# Patient Record
Sex: Male | Born: 1960
Health system: Southern US, Community
[De-identification: ages and names within clinical notes are randomized; demographics above are authoritative.]

## PROBLEM LIST (undated history)

## (undated) DIAGNOSIS — I739 Peripheral vascular disease, unspecified: Secondary | ICD-10-CM

## (undated) DIAGNOSIS — I714 Abdominal aortic aneurysm, without rupture, unspecified: Secondary | ICD-10-CM

## (undated) DIAGNOSIS — I10 Essential (primary) hypertension: Secondary | ICD-10-CM

## (undated) DIAGNOSIS — E11621 Type 2 diabetes mellitus with foot ulcer: Secondary | ICD-10-CM

## (undated) DIAGNOSIS — Z87442 Personal history of urinary calculi: Secondary | ICD-10-CM

## (undated) DIAGNOSIS — E785 Hyperlipidemia, unspecified: Secondary | ICD-10-CM

## (undated) DIAGNOSIS — M1990 Unspecified osteoarthritis, unspecified site: Secondary | ICD-10-CM

## (undated) DIAGNOSIS — E119 Type 2 diabetes mellitus without complications: Secondary | ICD-10-CM

## (undated) DIAGNOSIS — L97509 Non-pressure chronic ulcer of other part of unspecified foot with unspecified severity: Secondary | ICD-10-CM

## (undated) HISTORY — DX: Non-pressure chronic ulcer of other part of unspecified foot with unspecified severity: L97.509

## (undated) HISTORY — PX: INSERTION OF ILIAC STENT: SHX6256

## (undated) HISTORY — PX: CATARACT EXTRACTION: SUR2

## (undated) HISTORY — DX: Abdominal aortic aneurysm, without rupture, unspecified: I71.40

## (undated) HISTORY — DX: Peripheral vascular disease, unspecified: I73.9

## (undated) HISTORY — PX: AMPUTATION TOE: SHX6595

## (undated) HISTORY — DX: Hyperlipidemia, unspecified: E78.5

## (undated) HISTORY — PX: EYE SURGERY: SHX253

## (undated) HISTORY — DX: Type 2 diabetes mellitus with foot ulcer: E11.621

---

## 2013-09-13 HISTORY — PX: TOE AMPUTATION: SHX809

## 2014-01-25 ENCOUNTER — Ambulatory Visit: Payer: Self-pay | Admitting: Podiatry

## 2014-01-29 LAB — PATHOLOGY REPORT

## 2014-02-13 ENCOUNTER — Ambulatory Visit: Payer: Self-pay | Admitting: Vascular Surgery

## 2014-02-13 LAB — CREATININE, SERUM
Creatinine: 0.75 mg/dL
EGFR (African American): >60
EGFR (Non-African Amer.): >60

## 2014-02-13 LAB — BUN: BUN: 14 mg/dL (ref 7–18)

## 2015-01-04 NOTE — Op Note (Signed)
PATIENT NAME:  Anthony Park, Anthony Park MR#:  [id number] DATE OF BIRTH:  Apr 12, 1961  DATE OF PROCEDURE:  01/25/2014  PREOPERATIVE DIAGNOSIS: Left great toe and second toe gangrene.   POSTOPERATIVE DIAGNOSIS: Left great toe and second toe gangrene.   PROCEDURE: Amputation left great toe and second toe metatarsophalangeal joints.   SURGEON: Paralee Pendergrass Park. Ether GriffinsFowler, DPM.   ANESTHESIA: MAC with local.   HEMOSTASIS: None.   COMPLICATIONS: None.   SPECIMEN: Amputated great toe and second toe.   OPERATIVE INDICATIONS: This is Park 54 year old gentleman, who has developed open ulceration and gangrene of his great toe and second toe regions. He has undergone conservative and discussion for surgical intervention and presents today for surgery. All risks, benefits, alternatives and complications associated with surgery were discussed with the patient and informed consent has been given.   OPERATIVE PROCEDURE: The patient was brought into the OR and placed on the operating table in the supine position. IV sedation was administered by the anesthesia team. The left great toe and second toe were anesthetized with lidocaine and Marcaine. The left lower extremity was then prepped and draped in the usual sterile fashion. After sterile prep and drape, then attention was directed to the great toe and second toe. [place] incisions were made proximal to the IPJs of the joint. Full thickness incision was made down to bone. The second toe was excised through the proximal phalanx. This was removed from the surgical field in toto. The great toe was incised and osteotomized through the proximal phalanx as well. These were both sent for pathological examination. The wounds were then flushed with sterile saline. All bleeders were Bovie cauterized. Closure was performed with 3-0 nylon for the skin. Park well compressive sterile dressing was placed after 0.5% Marcaine was placed around the incision sites. Park bulky sterile dressing was then  applied. He will be given home-going instructions for minimal weight-bearing in Park postop shoe. I will see him back next week for Park dressing change. Park prescription for Percocet was given for the patient.   ____________________________ Argentina DonovanJustin Park. Ether GriffinsFowler, DPM jaf:aw D: 01/25/2014 10:19:15 ET T: 01/25/2014 10:32:39 ET JOB#: [id number]  cc: [name]. Ether GriffinsFowler, DPM, <Dictator> Fatemah Pourciau DPM ELECTRONICALLY SIGNED 02/07/2014 10:08

## 2015-01-04 NOTE — Op Note (Signed)
PATIENT NAME:  Anthony Park, [name] MR#:  [id number] DATE OF BIRTH:  Jan 04, 1961  DATE OF PROCEDURE:  02/13/2014  PREOPERATIVE DIAGNOSES: 1.  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  2.  Status post toe amputations for gangrenous changes from embolization.  3.  Diabetes.  4.  Tobacco dependence.   POSTOPERATIVE DIAGNOSES: 1.  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  2.  Status post toe amputations for gangrenous changes from embolization.  3.  Diabetes.  4.  Tobacco dependence.   PROCEDURES: 1.  Ultrasound guidance for vascular access to right femoral artery.  2.  Catheter placement to left peroneal artery from right femoral approach.  3.  Aortogram and selective left lower extremity angiogram.  4.  An 8 mm diameter x 25 cm in length Viabahn covered stent placement to the left popliteal artery for exclusion of popliteal artery aneurysm.  5.  StarClose closure device, right femoral artery.   SURGEON: Annice NeedyJason S. Neenah Canter, MD.   ANESTHESIA: Local with moderate conscious sedation.   BLOOD LOSS: 50 mL.   INDICATION FOR PROCEDURE: This is a 54 year old gentleman who I saw for gangrenous changes to toes on the left foot. He ultimately lost parts of toes 1 and 2 for the gangrenous changes. He was found to have normal perfusion but aneurysmal disease, predominantly in the left popliteal artery.  His left common femoral artery had a smaller aneurysm. He was brought in for treatment of this popliteal artery aneurysm. Risks and benefits were discussed. Informed consent was obtained.   DESCRIPTION OF PROCEDURE: The patient is brought to the vascular suite. Groins were shaved and prepped, and a sterile surgical field was created. Ultrasound was used to visualize a patent right femoral artery and was accessed under direct ultrasound guidance without difficulty with a Seldinger needle. A J-wire and 5-French sheath were then placed. Pigtail catheter was placed in the aorta at the L1  level and AP aortogram was performed. This showed what appeared to be a small infrarenal abdominal aortic aneurysm, ectasia to small aneurysms of the iliac vessels with marked tortuosity and a steep aortic bifurcation. I then used the pigtail catheter and an Advantage wire to cross the aortic bifurcation and advance down to the left femoral head. Selective left lower extremity angiogram was then performed. This demonstrated a small left femoral artery aneurysm. The left popliteal artery aneurysm had a mildly enlarged flow lumen. There was sluggish flow through this and only the posterior tibial artery was seen as distal runoff initially. The patient was systemically heparinized with 5000 units of intravenous heparin. The [facility] Advantage wire was parked well into the left popliteal artery and a 7-French shuttle sheath was then placed in the mid left superficial femoral artery. Imaging was performed to treat the aneurysm and exclude the entire popliteal artery of aneurysmal disease. An 8 mm diameter x 25 cm in length Viabahn covered stent was then deployed just above the origin of the left anterior tibial artery and just up to Hunter's canal. This was post dilated with an 8 mm balloon with excellent angiographic completion result and, distally, we now saw a peroneal artery patent and the posterior tibial artery remained widely patent. The sheath was then removed. StarClose closure device was then deployed in the usual fashion with excellent hemostatic result. The patient tolerated the procedure well and was taken to the recovery room in stable condition.    ____________________________ Annice NeedyJason S. Rhilynn Preyer, MD jsd:ce D: 02/13/2014 14:10:45 ET T: 02/13/2014 15:06:07 ET JOB#:  [id number]  cc: Annice Needy, MD, <Dictator> Argentina Donovan Ether Griffins, DPM Onnie Boer Carlynn Purl, MD Annice Needy MD ELECTRONICALLY SIGNED 02/22/2014 12:32

## 2017-01-20 ENCOUNTER — Ambulatory Visit: Payer: Self-pay | Admitting: Family Medicine

## 2017-01-21 ENCOUNTER — Ambulatory Visit: Payer: Self-pay | Admitting: Family Medicine

## 2018-11-05 ENCOUNTER — Inpatient Hospital Stay (HOSPITAL_COMMUNITY): Payer: BLUE CROSS/BLUE SHIELD

## 2018-11-05 ENCOUNTER — Emergency Department (HOSPITAL_COMMUNITY): Payer: BLUE CROSS/BLUE SHIELD

## 2018-11-05 ENCOUNTER — Inpatient Hospital Stay (HOSPITAL_COMMUNITY)
Admission: EM | Admit: 2018-11-05 | Discharge: 2018-11-07 | DRG: 617 | Disposition: A | Discharge status: Home / Self Care | Payer: BLUE CROSS/BLUE SHIELD | Attending: Internal Medicine | Admitting: Internal Medicine

## 2018-11-05 ENCOUNTER — Encounter (HOSPITAL_COMMUNITY): Payer: Self-pay | Admitting: Emergency Medicine

## 2018-11-05 DIAGNOSIS — L03031 Cellulitis of right toe: Secondary | ICD-10-CM

## 2018-11-05 DIAGNOSIS — E1169 Type 2 diabetes mellitus with other specified complication: Secondary | ICD-10-CM | POA: Diagnosis not present

## 2018-11-05 DIAGNOSIS — E11621 Type 2 diabetes mellitus with foot ulcer: Secondary | ICD-10-CM | POA: Diagnosis present

## 2018-11-05 DIAGNOSIS — E1152 Type 2 diabetes mellitus with diabetic peripheral angiopathy with gangrene: Secondary | ICD-10-CM | POA: Diagnosis not present

## 2018-11-05 DIAGNOSIS — Z825 Family history of asthma and other chronic lower respiratory diseases: Secondary | ICD-10-CM | POA: Diagnosis not present

## 2018-11-05 DIAGNOSIS — F17210 Nicotine dependence, cigarettes, uncomplicated: Secondary | ICD-10-CM | POA: Diagnosis present

## 2018-11-05 DIAGNOSIS — Z6830 Body mass index (BMI) 30.0-30.9, adult: Secondary | ICD-10-CM | POA: Diagnosis not present

## 2018-11-05 DIAGNOSIS — E669 Obesity, unspecified: Secondary | ICD-10-CM | POA: Diagnosis not present

## 2018-11-05 DIAGNOSIS — L0390 Cellulitis, unspecified: Secondary | ICD-10-CM | POA: Diagnosis not present

## 2018-11-05 DIAGNOSIS — I10 Essential (primary) hypertension: Secondary | ICD-10-CM | POA: Diagnosis not present

## 2018-11-05 DIAGNOSIS — Z808 Family history of malignant neoplasm of other organs or systems: Secondary | ICD-10-CM

## 2018-11-05 DIAGNOSIS — L97509 Non-pressure chronic ulcer of other part of unspecified foot with unspecified severity: Secondary | ICD-10-CM

## 2018-11-05 DIAGNOSIS — I96 Gangrene, not elsewhere classified: Secondary | ICD-10-CM

## 2018-11-05 DIAGNOSIS — E13621 Other specified diabetes mellitus with foot ulcer: Secondary | ICD-10-CM | POA: Diagnosis not present

## 2018-11-05 DIAGNOSIS — E1140 Type 2 diabetes mellitus with diabetic neuropathy, unspecified: Secondary | ICD-10-CM | POA: Diagnosis not present

## 2018-11-05 DIAGNOSIS — E785 Hyperlipidemia, unspecified: Secondary | ICD-10-CM | POA: Diagnosis present

## 2018-11-05 DIAGNOSIS — E7800 Pure hypercholesterolemia, unspecified: Secondary | ICD-10-CM | POA: Diagnosis not present

## 2018-11-05 DIAGNOSIS — Z89421 Acquired absence of other right toe(s): Secondary | ICD-10-CM | POA: Diagnosis not present

## 2018-11-05 DIAGNOSIS — M869 Osteomyelitis, unspecified: Secondary | ICD-10-CM

## 2018-11-05 DIAGNOSIS — Z79899 Other long term (current) drug therapy: Secondary | ICD-10-CM | POA: Diagnosis not present

## 2018-11-05 DIAGNOSIS — L97519 Non-pressure chronic ulcer of other part of right foot with unspecified severity: Secondary | ICD-10-CM | POA: Diagnosis not present

## 2018-11-05 HISTORY — DX: Type 2 diabetes mellitus without complications: E11.9

## 2018-11-05 HISTORY — DX: Essential (primary) hypertension: I10

## 2018-11-05 HISTORY — DX: Non-pressure chronic ulcer of other part of unspecified foot with unspecified severity: L97.509

## 2018-11-05 HISTORY — DX: Type 2 diabetes mellitus with foot ulcer: E11.621

## 2018-11-05 LAB — GLUCOSE, CAPILLARY
Glucose-Capillary: 205 mg/dL — ABNORMAL HIGH (ref 70–99)
Glucose-Capillary: 132 mg/dL — ABNORMAL HIGH (ref 70–99)

## 2018-11-05 LAB — BASIC METABOLIC PANEL
Anion gap: 13 (ref 5–15)
BUN: 10 mg/dL (ref 6–20)
CO2: 24 mmol/L (ref 22–32)
Calcium: 9.5 mg/dL (ref 8.9–10.3)
Chloride: 100 mmol/L (ref 98–111)
Creatinine, Ser: 0.73 mg/dL (ref 0.61–1.24)
GFR calc Af Amer: >60 mL/min (ref >60)
Glucose, Bld: 131 mg/dL — ABNORMAL HIGH (ref 70–99)
Potassium: 3.7 mmol/L (ref 3.5–5.1)
Sodium: 137 mmol/L (ref 135–145)

## 2018-11-05 LAB — CBC
HCT: 46.6 % (ref 39.0–52.0)
Hemoglobin: 15.3 g/dL (ref 13.0–17.0)
MCH: 29.1 pg (ref 26.0–34.0)
MCHC: 32.8 g/dL (ref 30.0–36.0)
MCV: 88.6 fL (ref 80.0–100.0)
Platelets: 170 10*3/uL (ref 150–400)
RBC: 5.26 MIL/uL (ref 4.22–5.81)
RDW: 12.7 % (ref 11.5–15.5)
WBC: 6.8 10*3/uL (ref 4.0–10.5)
nRBC: 0 % (ref 0.0–0.2)

## 2018-11-05 LAB — HEMOGLOBIN A1C
Hgb A1c MFr Bld: 6.7 % — ABNORMAL HIGH (ref 4.8–5.6)
Mean Plasma Glucose: 145.59 mg/dL

## 2018-11-05 MED ORDER — SODIUM CHLORIDE 0.9 % IV SOLN: 1.0000 g | INTRAVENOUS | Status: DC

## 2018-11-05 MED ORDER — SODIUM CHLORIDE 0.9 % IV SOLN
2.0000 g | INTRAVENOUS | Status: DC
  Administered 2018-11-06: 2 g via INTRAVENOUS
  Filled 2018-11-05 (×2): qty 20

## 2018-11-05 MED ORDER — VANCOMYCIN HCL 10 G IV SOLR: 1250.0000 mg | Freq: Two times a day (BID) | INTRAVENOUS | Status: DC

## 2018-11-05 MED ORDER — VANCOMYCIN HCL 10 G IV SOLR
1250.0000 mg | Freq: Two times a day (BID) | INTRAVENOUS | Status: DC
  Administered 2018-11-06 – 2018-11-07 (×3): 1250 mg via INTRAVENOUS
  Filled 2018-11-05 (×3): qty 1250

## 2018-11-05 MED ORDER — ACETAMINOPHEN 325 MG PO TABS: 650.0000 mg | ORAL_TABLET | Freq: Four times a day (QID) | ORAL | Status: DC | PRN

## 2018-11-05 MED ORDER — VANCOMYCIN HCL 10 G IV SOLR
2000.0000 mg | Freq: Once | INTRAVENOUS | Status: DC
  Filled 2018-11-05: qty 2000

## 2018-11-05 MED ORDER — VANCOMYCIN HCL IN DEXTROSE 750-5 MG/150ML-% IV SOLN
750.0000 mg | Freq: Once | INTRAVENOUS | Status: AC
  Administered 2018-11-05: 750 mg via INTRAVENOUS
  Filled 2018-11-05: qty 150

## 2018-11-05 MED ORDER — ACETAMINOPHEN 650 MG RE SUPP: 650.0000 mg | Freq: Four times a day (QID) | RECTAL | Status: DC | PRN

## 2018-11-05 MED ORDER — LABETALOL HCL 100 MG PO TABS: 100.0000 mg | ORAL_TABLET | Freq: Every day | ORAL | Status: DC | PRN

## 2018-11-05 MED ORDER — INSULIN ASPART 100 UNIT/ML ~~LOC~~ SOLN: 0.0000 [IU] | Freq: Every day | SUBCUTANEOUS | Status: DC

## 2018-11-05 MED ORDER — SODIUM CHLORIDE 0.9 % IV SOLN
1.0000 g | Freq: Once | INTRAVENOUS | Status: AC
  Administered 2018-11-05: 1 g via INTRAVENOUS
  Filled 2018-11-05: qty 10

## 2018-11-05 MED ORDER — METRONIDAZOLE IN NACL 5-0.79 MG/ML-% IV SOLN
500.0000 mg | Freq: Three times a day (TID) | INTRAVENOUS | Status: DC
  Administered 2018-11-05 – 2018-11-07 (×6): 500 mg via INTRAVENOUS
  Filled 2018-11-05 (×6): qty 100

## 2018-11-05 MED ORDER — SODIUM CHLORIDE 0.9 % IV SOLN
INTRAVENOUS | Status: DC
  Administered 2018-11-05 – 2018-11-07 (×3): via INTRAVENOUS

## 2018-11-05 MED ORDER — VANCOMYCIN HCL 10 G IV SOLR
1250.0000 mg | Freq: Once | INTRAVENOUS | Status: DC
  Administered 2018-11-05: 1250 mg via INTRAVENOUS
  Filled 2018-11-05: qty 1250

## 2018-11-05 MED ORDER — INSULIN ASPART 100 UNIT/ML ~~LOC~~ SOLN
0.0000 [IU] | Freq: Three times a day (TID) | SUBCUTANEOUS | Status: DC
  Administered 2018-11-05 – 2018-11-06 (×2): 3 [IU] via SUBCUTANEOUS
  Administered 2018-11-06: 1 [IU] via SUBCUTANEOUS

## 2018-11-05 MED ORDER — ENOXAPARIN SODIUM 40 MG/0.4ML ~~LOC~~ SOLN
40.0000 mg | SUBCUTANEOUS | Status: DC
  Administered 2018-11-05 – 2018-11-06 (×2): 40 mg via SUBCUTANEOUS
  Filled 2018-11-05 (×2): qty 0.4

## 2018-11-05 NOTE — H&P (Addendum)
Date: 11/05/2018               Patient Name:  Anthony Park MRN: [id number]  DOB: 1960-12-26 Age / Sex: 58 y.o., male   PCP: Alba Cory, MD         Medical Service: Internal Medicine Teaching Service         Attending Physician: Dr. Earl Lagos, MD    First Contact: Dr. Maryla Morrow Pager: [phone]  Second Contact: Dr. Frances Furbish Pager: [phone]       After Hours (After 5p/  First Contact Pager: [phone]  weekends / holidays): Second Contact Pager: [phone]   Chief Complaint: right third toe ulcer  History of Present Illness: 58 year old male with past medical history of diabetes (controlled without medication), who presented to emergency department worsening redness and ulcer at his right third toe.  He initially noticed pink discoloration of the third right toe, 2 weeks ago.  He had pin prick sensation on the toes sometimes but no actuall pain on it. Was not associated with major pain. He denies any fever or chills. No nausea or vomiting. He denies any trauma to his foot.  Meds:  Current Meds  Medication Sig  . Multiple Vitamins-Minerals (MULTIVITAMIN WITH MINERALS) tablet Take 1 tablet by mouth daily.  . TURMERIC PO Take 1 capsule by mouth every other day.     Allergies: Allergies as of 11/05/2018  . (No Known Allergies)   Past Medical History:  Diagnosis Date  . Diabetes mellitus without complication (HCC)   . Hypertension     Family History:  COPD              Mother Brain cancer    Father No family Hx of DM  Social History: current smoker, 40 pack year (1 pack cigarette/day x 40 years). Driks alcohole 2 per day (3 on weekend)  Review of Systems: A complete ROS was negative except as per HPI.  Physical Exam: Blood pressure (!) 198/112, pulse 65, temperature 98.8 F (37.1 C), temperature source Oral, resp. rate 20, height 6' (1.829 m), weight 103 kg, SpO2 98 %.   Physical Exam  Constitutional: He is oriented to person, place, and time and  well-developed, well-nourished, and in no distress.  HENT:  Head: Normocephalic and atraumatic.  Eyes: EOM are normal.  Cardiovascular: Normal rate, regular rhythm and normal heart sounds.  No murmur heard. Pulmonary/Chest: Effort normal and breath sounds normal. No respiratory distress. He has no wheezes. He has no rales.  Abdominal: He exhibits no distension. There is no abdominal tenderness.  Musculoskeletal: Normal range of motion. 1+ bilateral pitting LE Edema present.  Right foot: Right third toe with erythema, swelling and ulcer at tip of the toe with some necrotic tissue. No active bleeding or pus.  foot ROM is normal. Pulse is normal. Left foot: first and second  toe distal amputation. Has weaker pulse comparing to the right foot. Neurological: He is alert and oriented to person, place, and time.  Skin: Skin is warm.  Psychiatric: Mood, memory, affect and judgment normal.         Assessment & Plan by Problem: Active Problems:   Diabetic toe ulcer (HCC)  Diabetic toe ulcer, with cellulitis +/- osteomyelitis: No fever, No leukocytosis. Xray could not rul out osteo.  Will treat with AB and  -IV ceftriaxone, 1 g once and every 24 hours (first dose 11/05/2018) -IV vancomycin  q 12 h -IV metronidazole 500 mg every 8 hours -CBC  next a.m. -CMP next a.m. -MR without contrast right foot---> Soft tissue ulceration at the tip of the third toe with underlying osteomyelitis of the third distal phalanx. No abscess. 2. Nonspecific, nonaggressive appearing 9 mm lesion in the base of the second metatarsal, potentially a small cyst related to old trauma or stress injury. -Vascular ultrasound (ABI), bilateral -HIV antibody -PTT -Tylenol 650 mg every 6 hours PRN for mild pain or fever -N.p.o. after midnight -Will consult ortho tomorrow.   DM: Control without medication. BG at  131 and 205 -Sensitive SSI -CBG monitoring  HTN: Not on medication at home. BP at 170s-200s on arrival.  He is asymptomatic. We will monitor. Place order for PRN PO labetalol for BP>180s for tonight. -Monitor VS  Dispo: Admit patient to Inpatient with expected length of stay greater than 2 midnights.  Diet: Carb modified, n.p.o. after midnight IV fluid: VTE ppx: Lovenox 40 mg subcu daily Code status: Full code  Signed: Chevis Pretty, MD 11/05/2018, 4:18 PM  Pager: [phone]

## 2018-11-05 NOTE — ED Provider Notes (Signed)
[facility] EMERGENCY DEPARTMENT Provider Note   CSN: [id number] Arrival date & time: 11/05/18  0940    History   Chief Complaint Chief Complaint  Patient presents with  . Toe Injury    HPI Anthony Park is a 58 y.o. male.     HPI 58 year old male with a history of diabetes who presents to the emergency department with worsening redness and discoloration of his right third toe.  He initially noticed this 2 weeks ago.  He had been trimming a callus with nail clippers.  He then removed a large piece of callus resulting in splitting of the skin.  He continues to walk and work long hours on his feet.  He noticed increasing redness over the past several days and came to the ER for further evaluation after initial evaluation in urgent care who recommended ER treatment.  He has a history of partial amputation of a toe on his left foot.  He denies fevers and chills.  He is concerned about infection in his toe.  He has neuropathy.  He has no sensation in his feet.   Past Medical History:  Diagnosis Date  . Diabetes mellitus without complication (HCC)   . Hypertension     There are no active problems to display for this patient.   Past Surgical History:  Procedure Laterality Date  . TOE AMPUTATION Left 2015        Home Medications    Prior to Admission medications   Medication Sig Start Date End Date Taking? Authorizing Provider  Multiple Vitamins-Minerals (MULTIVITAMIN WITH MINERALS) tablet Take 1 tablet by mouth daily.   Yes [provider]  TURMERIC PO Take 1 capsule by mouth every other day.   Yes [provider]    Family History No family history on file.  Social History Social History   Tobacco Use  . Smoking status: Not on file  Substance Use Topics  . Alcohol use: Not on file  . Drug use: Not on file     Allergies   Patient has no known allergies.   Review of Systems Review of Systems  All other systems reviewed  and are negative.    Physical Exam Updated Vital Signs BP (!) 196/136   Pulse 65   Temp 98.8 F (37.1 C) (Oral)   Resp 16   Ht 6' (1.829 m)   Wt 103 kg   SpO2 99%   BMI 30.79 kg/m   Physical Exam Vitals signs and nursing note reviewed.  Constitutional:      Appearance: He is well-developed.  HENT:     Head: Normocephalic and atraumatic.  Neck:     Musculoskeletal: Normal range of motion.  Cardiovascular:     Rate and Rhythm: Normal rate and regular rhythm.     Heart sounds: Normal heart sounds.  Pulmonary:     Effort: Pulmonary effort is normal. No respiratory distress.     Breath sounds: Normal breath sounds.  Abdominal:     General: There is no distension.     Palpations: Abdomen is soft.     Tenderness: There is no abdominal tenderness.  Musculoskeletal: Normal range of motion.  Skin:    General: Skin is warm and dry.  Neurological:     Mental Status: He is alert and oriented to person, place, and time.  Psychiatric:        Judgment: Judgment normal.      ED Treatments / Results  Labs (all labs  ordered are listed, but only abnormal results are displayed) Labs Reviewed  BASIC METABOLIC PANEL - Abnormal; Notable for the following components:      Result Value   Glucose, Bld 131 (*)    All other components within normal limits  CBC    EKG None  Radiology Dg Toe 3rd Right  Result Date: 11/05/2018 CLINICAL DATA:  Ulcer to the middle right toe. EXAM: RIGHT THIRD TOE COMPARISON:  None. FINDINGS: Soft tissue ulcer and swelling of the tuft of the third right digit. There is a small area of possible cortical erosion of the distal right third phalanx, with which osteomyelitis can not be excluded. Vascular calcifications noted. IMPRESSION: Soft tissue ulcer and swelling of the tuft of the third right digit. There is a small area of possible cortical erosion of the distal right third phalanx, with which osteomyelitis can not be excluded. Electronically Signed    By: Ted Mcalpine M.D.   On: 11/05/2018 11:37    Procedures Procedures (including critical care time)  Medications Ordered in ED Medications  vancomycin (VANCOCIN) 1,250 mg in sodium chloride 0.9 % 250 mL IVPB (has no administration in time range)  cefTRIAXone (ROCEPHIN) 1 g in sodium chloride 0.9 % 100 mL IVPB (1 g Intravenous New Bag/Given 11/05/18 1047)     Initial Impression / Assessment and Plan / ED Course  I have reviewed the triage vital signs and the nursing notes.  Pertinent labs & imaging results that were available during my care of the patient were reviewed by me and considered in my medical decision making (see chart for details).        Cellulitis of his right third toe.  Questionable erosion on x-ray could represent early developing osteomyelitis.  Antibiotics now.  Elevation.  Admission to the hospital for ongoing IV antibiotic treatment.  Hopefully this can resolve with IV antibiotics and he can be switched to oral antibiotics.  He will need outpatient podiatry follow-up   Final Clinical Impressions(s) / ED Diagnoses   Final diagnoses:  Cellulitis of third toe of right foot    ED Discharge Orders    None       Azalia Bilis, MD 11/05/18 1203

## 2018-11-05 NOTE — Progress Notes (Signed)
Pharmacy Antibiotic Note:  Received call from RN. She received patient to unit with vancomycin 1250mg  infusion half completed, but not charted on. Will discontinue order for 2g loading dose and order 750mg  to be given after 1250mg  IV bag complete. This will complete 2g loading dose.   Maintenance dosing to start 12 hours later.  Thank you for involving pharmacy in this patient's care.  Wendelyn Breslow, PharmD PGY1 Pharmacy Resident Phone: [phone] 11/05/2018 3:18 PM

## 2018-11-05 NOTE — Progress Notes (Signed)
Pharmacy Antibiotic Note  GILLIS HOLTHUSEN is a 58 y.o. male admitted on 11/05/2018 with toe injury.  Imaging shows soft tissue ulcer and swelling and osteomyelitis cannot be ruled out.  Pharmacy has been consulted for vancomycin dosing.  Patient is also started on Rocephin and Flagyl.  SCr 0.73, CrCL > 100 ml/min, afebrile, WBC WNL.  Plan: Vanc 2gm IV x 1, then 1250mg  IV Q12H for AUC 499 using SCr 0.8 Rocephin 1gm IV x 1 now for a total of 2gm today, then 2gm IV Q24H Flagyl 500mg  IV Q8H per MD Monitor renal fxn, clinical course, vanc level as indicated  Height: 6' (182.9 cm) Weight: 227 lb (103 kg) IBW/kg (Calculated) : 77.6  Temp (24hrs), Avg:98.8 F (37.1 C), Min:98.8 F (37.1 C), Max:98.8 F (37.1 C)  Recent Labs  Lab 11/05/18 0950  WBC 6.8  CREATININE 0.73    Estimated Creatinine Clearance: 126.5 mL/min (by C-G formula based on SCr of 0.73 mg/dL).    No Known Allergies   Denver Bentson D. Laney Potash, PharmD, BCPS, BCCCP 11/05/2018, 1:21 PM

## 2018-11-05 NOTE — ED Triage Notes (Signed)
Patient here with complaint of ulcer on middle right toe he first noticed two weeks ago. Approximately 2cm wound at distal toe. Patient has reduced sensation in toe, history of diabetes, controlled with diet. Reports occasional minor pain but not severe enough to prevent him from walking. Patient alert, oriented, and in no apparent distress.

## 2018-11-05 NOTE — Progress Notes (Addendum)
B/p of 198/112. MD made aware. Patient asymptomatic, no complaints headache or dizziness.

## 2018-11-05 NOTE — Plan of Care (Signed)

## 2018-11-05 NOTE — Progress Notes (Signed)
Anthony Park is a 58 y.o. male patient admitted. Awake, alert - oriented  X 4 - no acute distress noted.  VSS - Blood pressure (!) 196/136, pulse 65, temperature 98.8 F (37.1 C), temperature source Oral, resp. rate 16, height 6' (1.829 m), weight 103 kg, SpO2 99 %.    IV in place, occlusive dsg intact without redness. Report received from [facility]  Orientation to room, and floor completed.  Admission INP armband ID verified with patient/family, and in place.   SR up x 2, fall assessment complete, with patient and family able to verbalize understanding of risk associated with falls, and verbalized understanding to call nsg before up out of bed.  Call light within reach, patient able to voice, and demonstrate understanding. No evidence of skin break down noted on exam.       Will cont to eval and treat per MD orders.  Krystal Clark, RN 11/05/2018 1:47 PM

## 2018-11-05 NOTE — ED Notes (Signed)
Vanc order d/c and modified. Will send new vanc up to floor.

## 2018-11-06 ENCOUNTER — Inpatient Hospital Stay (HOSPITAL_COMMUNITY): Payer: BLUE CROSS/BLUE SHIELD

## 2018-11-06 DIAGNOSIS — E11621 Type 2 diabetes mellitus with foot ulcer: Secondary | ICD-10-CM

## 2018-11-06 DIAGNOSIS — L03031 Cellulitis of right toe: Secondary | ICD-10-CM

## 2018-11-06 DIAGNOSIS — L97514 Non-pressure chronic ulcer of other part of right foot with necrosis of bone: Secondary | ICD-10-CM

## 2018-11-06 DIAGNOSIS — L0390 Cellulitis, unspecified: Secondary | ICD-10-CM

## 2018-11-06 LAB — PROTIME-INR
INR: 1.09
Prothrombin Time: 14 seconds (ref 11.4–15.2)

## 2018-11-06 LAB — LIPID PANEL
Cholesterol: 245 mg/dL — ABNORMAL HIGH (ref 0–200)
HDL: 47 mg/dL (ref >40)
LDL Cholesterol: 155 mg/dL — ABNORMAL HIGH (ref 0–99)
Total CHOL/HDL Ratio: 5.2 RATIO
Triglycerides: 216 mg/dL — ABNORMAL HIGH (ref <150)
VLDL: 43 mg/dL — ABNORMAL HIGH (ref 0–40)

## 2018-11-06 LAB — COMPREHENSIVE METABOLIC PANEL
ALT: 19 U/L (ref 0–44)
AST: 25 U/L (ref 15–41)
Albumin: 3.5 g/dL (ref 3.5–5.0)
Alkaline Phosphatase: 53 U/L (ref 38–126)
Anion gap: 10 (ref 5–15)
BUN: 12 mg/dL (ref 6–20)
CO2: 23 mmol/L (ref 22–32)
Calcium: 8.9 mg/dL (ref 8.9–10.3)
Chloride: 103 mmol/L (ref 98–111)
Creatinine, Ser: 0.83 mg/dL (ref 0.61–1.24)
Glucose, Bld: 107 mg/dL — ABNORMAL HIGH (ref 70–99)
Potassium: 3.6 mmol/L (ref 3.5–5.1)
Sodium: 136 mmol/L (ref 135–145)
Total Bilirubin: 1 mg/dL (ref 0.3–1.2)
Total Protein: 6.8 g/dL (ref 6.5–8.1)

## 2018-11-06 LAB — CBC
HCT: 41.2 % (ref 39.0–52.0)
Hemoglobin: 13.7 g/dL (ref 13.0–17.0)
MCH: 29.1 pg (ref 26.0–34.0)
MCHC: 33.3 g/dL (ref 30.0–36.0)
MCV: 87.7 fL (ref 80.0–100.0)
Platelets: 161 10*3/uL (ref 150–400)
RBC: 4.7 MIL/uL (ref 4.22–5.81)
RDW: 12.6 % (ref 11.5–15.5)
WBC: 6.4 10*3/uL (ref 4.0–10.5)
nRBC: 0 % (ref 0.0–0.2)

## 2018-11-06 LAB — GLUCOSE, CAPILLARY
GLUCOSE-CAPILLARY: 201 mg/dL — AB (ref 70–99)
Glucose-Capillary: 116 mg/dL — ABNORMAL HIGH (ref 70–99)
Glucose-Capillary: 130 mg/dL — ABNORMAL HIGH (ref 70–99)
Glucose-Capillary: 145 mg/dL — ABNORMAL HIGH (ref 70–99)
Glucose-Capillary: 97 mg/dL (ref 70–99)

## 2018-11-06 LAB — HIV ANTIBODY (ROUTINE TESTING W REFLEX): HIV Screen 4th Generation wRfx: NONREACTIVE

## 2018-11-06 LAB — APTT: aPTT: 28 seconds (ref 24–36)

## 2018-11-06 MED ORDER — ROSUVASTATIN CALCIUM 20 MG PO TABS
20.0000 mg | ORAL_TABLET | Freq: Every day | ORAL | Status: DC
  Administered 2018-11-06: 20 mg via ORAL
  Filled 2018-11-06: qty 1

## 2018-11-06 MED ORDER — LISINOPRIL 20 MG PO TABS
20.0000 mg | ORAL_TABLET | Freq: Every day | ORAL | Status: DC
  Administered 2018-11-06: 20 mg via ORAL
  Filled 2018-11-06 (×2): qty 1

## 2018-11-06 MED ORDER — SODIUM CHLORIDE 0.9 % IV SOLN
INTRAVENOUS | Status: DC | PRN
  Administered 2018-11-06: 10 mL via INTRAVENOUS

## 2018-11-06 NOTE — Progress Notes (Signed)
Subjective: Pt reports feeling well today, no pain in his foot.  No fevers or chills.  Also denies any dizziness headache or any other complaints.  We talked to him about the plan of talking to orthopedic surgeons for evaluating his toes meanwhile that we continue IV antibiotic.  He verbalized understanding and agrees with the plan.    Objective:  Vital signs in last 24 hours: Vitals:   11/05/18 1349 11/05/18 1719 11/05/18 2053 11/06/18 0503  BP: (!) 198/112 (!) 179/101 (!) 174/101 (!) 172/99  Pulse: 65 64 70 64  Resp: 20 18 18 18   Temp:  98.8 F (37.1 C) 98.6 F (37 C) 98.5 F (36.9 C)  TempSrc:  Oral Oral Oral  SpO2: 98% 96% 95% 97%  Weight:   103 kg   Height:       Physical Exam  General: He is oriented to person, place, and time and well-developed, well-nourished, sitting on the bed and in no distress.  Head: Normocephalic and atraumatic.  Eyes: EOM are normal.  CV: Normal rate, regular rhythm and normal heart sounds.  No murmur heard. Pulm: Effort normal and breath sounds normal. No respiratory distress. He has no wheezes. He has no rales.  Abdominal: Soft, this are present, there is no abdominal tenderness.  Musculoskeletal: Normal range of motion. 1+ bilateral pitting LE Edema present.  Right foot: Right third toe with erythema, swelling and ulcer at tip of the toe with some necrotic tissue. No active bleeding or pus.  (Redness improved comparing to yesterday), foot ROM is normal. Pulse is normal. Left foot: first and second  toe distal phalanx are amputated. Has weaker pulse comparing to the right foot. Neurological: He is alert and oriented to person, place, and time.  Skin: Skin is warm.  Psychiatric:  Normal mood, affect and judgment   Assessment/Plan:  Active Problems:   Diabetic toe ulcer (HCC)  58 year old male with past medical history of DM type II (not on medication) and history of left second distal phalanx mutation, presented with right third toe  erythema, swelling and open ulcer.  Right third toe osteomyelitisosteomyelitis: Likely secondary to PAD and type 2 DM No fever,Noleukocytosis. MRI with showed underlying osteomyelitis of the third distal phalanx without abscess.  Is on IV Ceftriaxon, Vanc and Metro. Doing well today and is pain-free. and with nl white count. No evidence of systemic infection. ABI is pending.  We will continue IV antibiotic for osteomyelitis, and consult orthopedic to evaluate if he needs amputation.  -Continue IV ceftriaxone, 1 g every 24 hours(first dose 11/05/2018) -Continue IV vancomycinq 12 h -ContinueIV metronidazole 500 mg every 8 hours -Consulted ortho. We appreciate their recommendation -Carb modified diet (Per Ortho: if they decide to do the surgery, it will be on Wednesday not today) -Lipid panel--> total cholesterol 245 and LDL 155.  Will restart Crestor. -Start Crestor -Will start aspirin on discharge -F/u vascular ultrasound (ABI),bilateral -Tylenol 650 mg every 6 hours PRN for mild pain or fever -CBC Daily -CMP daily -f/u HIV antibody  DMII.  Not on medication at home. BG at131 and 205 -Sensitive SSI -CBG monitoring  HTN: Not on medication at home. BP at 170s today. He is asymptomatic. Discussed with patient that he may need antihypertension medication if his BP stays elevated.   We will monitor. -Monitor VS and may start him on anti HTN agent next days if stays high  Dispo: Admit patient toInpatient with expected length of stay greater than 2 midnights.  Diet:Carb  modified IV fluid: None VTE PPJ:KDTOIZT 40 mg subcu daily Code status:Full code  Dispo: Anticipated discharge in approximately 2-3 days (Depends on ortho recommendation)  Angelita Ingles, MD 11/06/2018, 8:40 AM Pager: [phone]

## 2018-11-06 NOTE — H&P (View-Only) (Signed)
ORTHOPAEDIC CONSULTATION  REQUESTING PHYSICIAN: Earl Lagos, MD  Chief Complaint: Ulceration pain and swelling right foot third toe  HPI: Anthony Park is a 58 y.o. male who presents with osteomyelitis right foot third toe.  Patient is diabetic he states that it is well controlled with diet he has a history of hypertension as well as high cholesterol and is a smoker.  He is status post amputation partially of the great toe and second toe of the left foot.  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  Past Medical History:  Diagnosis Date  . Diabetes mellitus without complication (HCC)   . Hypertension    Past Surgical History:  Procedure Laterality Date  . TOE AMPUTATION Left 2015   Social History   Socioeconomic History  . Marital status: Married    Spouse name: Not on file  . Number of children: Not on file  . Years of education: Not on file  . Highest education level: Not on file  Occupational History  . Not on file  Social Needs  . Financial resource strain: Not on file  . Food insecurity:    Worry: Not on file    Inability: Not on file  . Transportation needs:    Medical: Not on file    Non-medical: Not on file  Tobacco Use  . Smoking status: Not on file  Substance and Sexual Activity  . Alcohol use: Not on file  . Drug use: Not on file  . Sexual activity: Not on file  Lifestyle  . Physical activity:    Days per week: Not on file    Minutes per session: Not on file  . Stress: Not on file  Relationships  . Social connections:    Talks on phone: Not on file    Gets together: Not on file    Attends religious service: Not on file    Active member of club or organization: Not on file    Attends meetings of clubs or organizations: Not on file    Relationship status: Not on file  Other Topics Concern  . Not on file  Social History Narrative  . Not on file   No family history on file. - negative except  otherwise stated in the family history section No Known Allergies Prior to Admission medications   Medication Sig Start Date End Date Taking? Authorizing Provider  Multiple Vitamins-Minerals (MULTIVITAMIN WITH MINERALS) tablet Take 1 tablet by mouth daily.   Yes [provider]  TURMERIC PO Take 1 capsule by mouth every other day.   Yes [provider]   Mr Foot Right Wo Contrast  Result Date: 11/05/2018 CLINICAL DATA:  Third toe infection. EXAM: MRI OF THE RIGHT FOREFOOT WITHOUT CONTRAST TECHNIQUE: Multiplanar, multisequence MR imaging of the right forefoot was performed. No intravenous contrast was administered. COMPARISON:  Right third toe x-rays from same day. FINDINGS: Bones/Joint/Cartilage Marrow edema throughout the third distal phalanx with cortical erosion and loss of the normal T1 marrow signal at the tip of the phalanx, consistent with osteomyelitis. There is a oval 8 x 6 x 9 mm T2 hyperintense, T1 hypointense lesion in the base of the second metatarsal without osseous destruction or periosteal reaction. No fracture or dislocation. Normal alignment. No joint effusion. Ligaments Collateral ligaments are intact. Muscles and Tendons Flexor and extensor compartment tendons are intact. Increased T2 signal within the intrinsic muscles of the forefoot, nonspecific, but likely  related to diabetic muscle changes. Soft tissue Soft tissue ulceration at the tip of the third toe. No fluid collection or hematoma. No soft tissue mass. IMPRESSION: 1. Soft tissue ulceration at the tip of the third toe with underlying osteomyelitis of the third distal phalanx. No abscess. 2. Nonspecific, nonaggressive appearing 9 mm lesion in the base of the second metatarsal, potentially a small cyst related to old trauma or stress injury. Electronically Signed   By: Obie Dredge M.D.   On: 11/05/2018 13:43   Dg Toe 3rd Right  Result Date: 11/05/2018 CLINICAL DATA:  Ulcer to the middle right toe. EXAM:  RIGHT THIRD TOE COMPARISON:  None. FINDINGS: Soft tissue ulcer and swelling of the tuft of the third right digit. There is a small area of possible cortical erosion of the distal right third phalanx, with which osteomyelitis can not be excluded. Vascular calcifications noted. IMPRESSION: Soft tissue ulcer and swelling of the tuft of the third right digit. There is a small area of possible cortical erosion of the distal right third phalanx, with which osteomyelitis can not be excluded. Electronically Signed   By: Ted Mcalpine M.D.   On: 11/05/2018 11:37   - pertinent xrays, CT, MRI studies were reviewed and independently interpreted  Positive ROS: All other systems have been reviewed and were otherwise negative with the exception of those mentioned in the HPI and as above.  Physical Exam: General: Alert, no acute distress Psychiatric: Patient is competent for consent with normal mood and affect Lymphatic: No axillary or cervical lymphadenopathy Cardiovascular: No pedal edema Respiratory: No cyanosis, no use of accessory musculature GI: No organomegaly, abdomen is soft and non-tender    Images:  @ENCIMAGES @  Labs:  Lab Results  Component Value Date   HGBA1C 6.7 (H) 11/05/2018    Lab Results  Component Value Date   ALBUMIN 3.5 11/06/2018    Neurologic: Patient does not have protective sensation bilateral lower extremities.   MUSCULOSKELETAL:   Skin: Examination patient has swelling ulceration to the right foot third toe.  The amputations partially of the great toe and second toe left foot of well-healed.  Patient has a good dorsalis pedis and posterior tibial pulse on the right foot no ascending cellulitis.  Review of the MRI scan shows osteomyelitis of the distal phalanx of the right third toe.  There is some edema and the second metatarsal shaft but this appears to be more of a stress reaction than osteomyelitis.  There is no abscess.  Assessment: Assessment:  Diabetic insensate neuropathy with hypertension high cholesterol smoking and osteomyelitis of the third toe right foot.  Plan: Plan: We will plan for amputation of the right foot third toe either on Tuesday or Wednesday.  Continue IV antibiotics until surgery and anticipate patient could be discharged without antibiotics after surgery.  Patient will need medication for his hypertension and cholesterol.  Thank you for the consult and the opportunity to see Mr. Donnamarie Poag, MD [facility] [phone] 12:29 PM

## 2018-11-06 NOTE — Anesthesia Preprocedure Evaluation (Addendum)
Anesthesia Evaluation  Patient identified by MRN, date of birth, ID band Patient awake    Reviewed: Allergy & Precautions, NPO status , Patient's Chart, lab work & pertinent test results  Airway Mallampati: II  TM Distance: >3 FB Neck ROM: Full    Dental no notable dental hx. (+) Teeth Intact, Dental Advisory Given   Pulmonary Current Smoker,    Pulmonary exam normal breath sounds clear to auscultation       Cardiovascular hypertension, Normal cardiovascular exam Rhythm:Regular Rate:Normal     Neuro/Psych negative neurological ROS  negative psych ROS   GI/Hepatic negative GI ROS, Neg liver ROS,   Endo/Other  diabetes  Renal/GU negative Renal ROS     Musculoskeletal negative musculoskeletal ROS (+)   Abdominal (+) + obese,   Peds  Hematology negative hematology ROS (+)   Anesthesia Other Findings   Reproductive/Obstetrics                            Anesthesia Physical Anesthesia Plan  ASA: III  Anesthesia Plan: Regional   Post-op Pain Management:  Regional for Post-op pain   Induction:   PONV Risk Score and Plan: Treatment may vary due to age or medical condition, Ondansetron and Dexamethasone  Airway Management Planned: Natural Airway and Nasal Cannula  Additional Equipment:   Intra-op Plan:   Post-operative Plan:   Informed Consent:     Dental advisory given  Plan Discussed with:   Anesthesia Plan Comments: (R third toe Amputation w Pop block)        Anesthesia Quick Evaluation

## 2018-11-06 NOTE — Progress Notes (Signed)
ABI/TBI completed. Please see preliminary notes on CV PROC under chart review. Anthony Park H [place](RDMS RVT) 11/06/18 3:30 PM

## 2018-11-06 NOTE — Consult Note (Signed)
Reason for Consult:Toe osteo Referring Physician: [name] is an 58 y.o. male.  HPI: Anthony Park came to the hospital due to a wound on his right 3rd toe. It began to turn red about 2 weeks ago and then opened about 4d ago. He denies any pain, fever, chills, sweats, N/V. He had a previous experience with the contralateral foot about 5 years ago that resulted in amputation of the great and 2nd toes. He is a diet-controlled diabetic; CBG's run about 120-130.  Past Medical History:  Diagnosis Date  . Diabetes mellitus without complication (HCC)   . Hypertension     Past Surgical History:  Procedure Laterality Date  . TOE AMPUTATION Left 2015    No family history on file.  Social History:  has no history on file for tobacco, alcohol, and drug.  Allergies: No Known Allergies  Medications: I have reviewed the patient's current medications.  Results for orders placed or performed during the hospital encounter of 11/05/18 (from the past 48 hour(s))  CBC     Status: None   Collection Time: 11/05/18  9:50 AM  Result Value Ref Range   WBC 6.8 4.0 - 10.5 K/uL   RBC 5.26 4.22 - 5.81 MIL/uL   Hemoglobin 15.3 13.0 - 17.0 g/dL   HCT 16.1 09.6 - 04.5 %   MCV 88.6 80.0 - 100.0 fL   MCH 29.1 26.0 - 34.0 pg   MCHC 32.8 30.0 - 36.0 g/dL   RDW 40.9 81.1 - 91.4 %   Platelets 170 150 - 400 K/uL   nRBC 0.0 0.0 - 0.2 %    Comment: Performed at [facility], [address], [place], [place] [address]  Basic metabolic panel     Status: Abnormal   Collection Time: 11/05/18  9:50 AM  Result Value Ref Range   Sodium 137 135 - 145 mmol/L   Potassium 3.7 3.5 - 5.1 mmol/L   Chloride 100 98 - 111 mmol/L   CO2 24 22 - 32 mmol/L   Glucose, Bld 131 (H) 70 - 99 mg/dL   BUN 10 6 - 20 mg/dL   Creatinine, Ser 6.21 0.61 - 1.24 mg/dL   Calcium 9.5 8.9 - 30.8 mg/dL   GFR calc non Af Amer >60 >60 mL/min   GFR calc Af Amer >60 >60 mL/min   Anion gap 13 5 - 15    Comment: Performed at [facility], [address], [place], [place] [address]  Hemoglobin A1c     Status: Abnormal   Collection Time: 11/05/18  9:50 AM  Result Value Ref Range   Hgb A1c MFr Bld 6.7 (H) 4.8 - 5.6 %    Comment: (NOTE) Pre diabetes:          5.7%-6.4% Diabetes:              >6.4% Glycemic control for   <7.0% adults with diabetes    Mean Plasma Glucose 145.59 mg/dL    Comment: Performed at [facility], [address], [place], [place] [address]  Glucose, capillary     Status: Abnormal   Collection Time: 11/05/18  4:18 PM  Result Value Ref Range   Glucose-Capillary 205 (H) 70 - 99 mg/dL  Glucose, capillary     Status: Abnormal   Collection Time: 11/05/18  8:52 PM  Result Value Ref Range   Glucose-Capillary 132 (H) 70 - 99 mg/dL  Comprehensive metabolic panel  Status: Abnormal   Collection Time: 11/06/18  3:50 AM  Result Value Ref Range   Sodium 136 135 - 145 mmol/L   Potassium 3.6 3.5 - 5.1 mmol/L   Chloride 103 98 - 111 mmol/L   CO2 23 22 - 32 mmol/L   Glucose, Bld 107 (H) 70 - 99 mg/dL   BUN 12 6 - 20 mg/dL   Creatinine, Ser 3.43 0.61 - 1.24 mg/dL   Calcium 8.9 8.9 - 56.8 mg/dL   Total Protein 6.8 6.5 - 8.1 g/dL   Albumin 3.5 3.5 - 5.0 g/dL   AST 25 15 - 41 U/L   ALT 19 0 - 44 U/L   Alkaline Phosphatase 53 38 - 126 U/L   Total Bilirubin 1.0 0.3 - 1.2 mg/dL   GFR calc non Af Amer >60 >60 mL/min   GFR calc Af Amer >60 >60 mL/min   Anion gap 10 5 - 15    Comment: Performed at [facility], [address] [place], [place] [address]  CBC     Status: None   Collection Time: 11/06/18  3:50 AM  Result Value Ref Range   WBC 6.4 4.0 - 10.5 K/uL   RBC 4.70 4.22 - 5.81 MIL/uL   Hemoglobin 13.7 13.0 - 17.0 g/dL   HCT 72.9 02.1 - 11.5 %   MCV 87.7 80.0 - 100.0 fL   MCH 29.1 26.0 - 34.0 pg   MCHC 33.3 30.0 - 36.0 g/dL   RDW 52.0 80.2 - 23.3 %   Platelets 161 150 - 400 K/uL   nRBC 0.0 0.0 - 0.2 %    Comment: Performed at [facility], [address]  [place], [place] [address]  Protime-INR     Status: None   Collection Time: 11/06/18  3:50 AM  Result Value Ref Range   Prothrombin Time 14.0 11.4 - 15.2 seconds   INR 1.09     Comment: Performed at [facility], [address], [place], [place] [address]  APTT     Status: None   Collection Time: 11/06/18  3:50 AM  Result Value Ref Range   aPTT 28 24 - 36 seconds    Comment: Performed at [facility], [address] [place], [place] [address]  Lipid panel     Status: Abnormal   Collection Time: 11/06/18  3:50 AM  Result Value Ref Range   Cholesterol 245 (H) 0 - 200 mg/dL   Triglycerides 021 (H) <150 mg/dL   HDL 47 >11 mg/dL   Total CHOL/HDL Ratio 5.2 RATIO   VLDL 43 (H) 0 - 40 mg/dL   LDL Cholesterol 735 (H) 0 - 99 mg/dL    Comment:        Total Cholesterol/HDL:CHD Risk Coronary Heart Disease Risk Table                     Men   Women  1/2 Average Risk   3.4   3.3  Average Risk       5.0   4.4  2 X Average Risk   9.6   7.1  3 X Average Risk  23.4   11.0        Use the calculated Patient Ratio above and the CHD Risk Table to determine the patient's CHD Risk.        ATP III CLASSIFICATION (LDL):  <100     mg/dL   Optimal  670-141  mg/dL  Near or Above                    Optimal  130-159  mg/dL   Borderline  423-953  mg/dL   High  >202     mg/dL   Very High Performed at [facility], [address] [place], [place] [address]   Glucose, capillary     Status: Abnormal   Collection Time: 11/06/18  4:45 AM  Result Value Ref Range   Glucose-Capillary 116 (H) 70 - 99 mg/dL  Glucose, capillary     Status: Abnormal   Collection Time: 11/06/18  7:48 AM  Result Value Ref Range   Glucose-Capillary 130 (H) 70 - 99 mg/dL    Mr Foot Right Wo Contrast  Result Date: 11/05/2018 CLINICAL DATA:  Third toe infection. EXAM: MRI OF THE RIGHT FOREFOOT WITHOUT CONTRAST TECHNIQUE: Multiplanar, multisequence MR imaging of the right forefoot was performed. No intravenous  contrast was administered. COMPARISON:  Right third toe x-rays from same day. FINDINGS: Bones/Joint/Cartilage Marrow edema throughout the third distal phalanx with cortical erosion and loss of the normal T1 marrow signal at the tip of the phalanx, consistent with osteomyelitis. There is a oval 8 x 6 x 9 mm T2 hyperintense, T1 hypointense lesion in the base of the second metatarsal without osseous destruction or periosteal reaction. No fracture or dislocation. Normal alignment. No joint effusion. Ligaments Collateral ligaments are intact. Muscles and Tendons Flexor and extensor compartment tendons are intact. Increased T2 signal within the intrinsic muscles of the forefoot, nonspecific, but likely related to diabetic muscle changes. Soft tissue Soft tissue ulceration at the tip of the third toe. No fluid collection or hematoma. No soft tissue mass. IMPRESSION: 1. Soft tissue ulceration at the tip of the third toe with underlying osteomyelitis of the third distal phalanx. No abscess. 2. Nonspecific, nonaggressive appearing 9 mm lesion in the base of the second metatarsal, potentially a small cyst related to old trauma or stress injury. Electronically Signed   By: Obie Dredge M.D.   On: 11/05/2018 13:43   Dg Toe 3rd Right  Result Date: 11/05/2018 CLINICAL DATA:  Ulcer to the middle right toe. EXAM: RIGHT THIRD TOE COMPARISON:  None. FINDINGS: Soft tissue ulcer and swelling of the tuft of the third right digit. There is a small area of possible cortical erosion of the distal right third phalanx, with which osteomyelitis can not be excluded. Vascular calcifications noted. IMPRESSION: Soft tissue ulcer and swelling of the tuft of the third right digit. There is a small area of possible cortical erosion of the distal right third phalanx, with which osteomyelitis can not be excluded. Electronically Signed   By: Ted Mcalpine M.D.   On: 11/05/2018 11:37    Review of Systems  Constitutional: Negative for  chills, fever and weight loss.  HENT: Negative for ear discharge, ear pain, hearing loss and tinnitus.   Eyes: Negative for blurred vision, double vision, photophobia and pain.  Respiratory: Negative for cough, sputum production and shortness of breath.   Cardiovascular: Negative for chest pain.  Gastrointestinal: Negative for abdominal pain, nausea and vomiting.  Genitourinary: Negative for dysuria, flank pain, frequency and urgency.  Musculoskeletal: Negative for back pain, falls, joint pain, myalgias and neck pain.  Neurological: Negative for dizziness, tingling, sensory change, focal weakness, loss of consciousness and headaches.  Endo/Heme/Allergies: Does not bruise/bleed easily.  Psychiatric/Behavioral: Negative for depression, memory loss and substance abuse. The patient is not nervous/anxious.  Blood pressure (!) 172/103, pulse 61, temperature 98.7 F (37.1 C), temperature source Oral, resp. rate 18, height 6' (1.829 m), weight 103 kg, SpO2 98 %. Physical Exam  Constitutional: He appears well-developed and well-nourished. No distress.  HENT:  Head: Normocephalic and atraumatic.  Eyes: Conjunctivae are normal. Right eye exhibits no discharge. Left eye exhibits no discharge. No scleral icterus.  Neck: Normal range of motion.  Cardiovascular: Normal rate and regular rhythm.  Respiratory: Effort normal. No respiratory distress.  Musculoskeletal:     Comments: RLE No traumatic wounds, ecchymosis, or rash  Ulceration tip of 2nd toe  No knee or ankle effusion  Knee stable to varus/ valgus and anterior/posterior stress  Sens DPN, SPN, TN intact, palmar surface of toes anesthetic  Motor EHL, ext, flex, evers 5/5  DP 2+, PT 2+, No significant edema  Neurological: He is alert.  Skin: Skin is warm and dry. He is not diaphoretic.  Psychiatric: He has a normal mood and affect. His behavior is normal.        Assessment/Plan: Right 3nd toe ulceration with distal phalangeal  osteomyelitis -- Dr. Lajoyce Corners to evaluate, suspect amputation will be recommendation. Likely time for this would be Wednesday. This could be done as outpatient to avoid hospitalization for that long in which case would discharge on suppressive antibiotics and have him follow up with Dr. Lajoyce Corners in the office.    Freeman Caldron, PA-C Orthopedic Surgery [phone] 11/06/2018, 11:11 AM

## 2018-11-06 NOTE — Discharge Summary (Signed)
Name: Anthony Park MRN: [id number] DOB: 06-Sep-1961 58 y.o. PCP: Doren Custard, FNP  Date of Admission: 11/05/2018  9:43 AM Date of Discharge: 11/07/2018 Attending Physician: No att. providers found  Dr. Heide Spark  Discharge Diagnosis: 1. Active Problems:   Diabetic toe ulcer (HCC)   Cellulitis of third toe of right foot   Osteomyelitis of third toe of right foot [facility])    Discharge Medications: Allergies as of 11/07/2018   No Known Allergies     Medication List    STOP taking these medications   TURMERIC PO     TAKE these medications   acetaminophen 325 MG tablet Commonly known as:  TYLENOL Take 2 tablets (650 mg total) by mouth every 6 (six) hours as needed for mild pain (or Fever >/= 101).   aspirin EC 81 MG tablet Take 1 tablet (81 mg total) by mouth daily.   lisinopril 20 MG tablet Commonly known as:  PRINIVIL,ZESTRIL Take 1 tablet (20 mg total) by mouth daily.   multivitamin with minerals tablet Take 1 tablet by mouth daily.   rosuvastatin 20 MG tablet Commonly known as:  CRESTOR Take 1 tablet (20 mg total) by mouth daily at 6 PM.            Discharge Care Instructions  (From admission, onward)         Start     Ordered   11/07/18 0000  Touch down weight bearing    Question Answer Comment  Laterality right   Extremity Lower      11/07/18 0944          Disposition and follow-up:   Mr.Jarious A Clippard was discharged from [facility] in stable condition.  At the hospital follow up visit please address:  1. Patient underwent right third toe amputation due to osteomyelitis. No Ab at discharge is needed. Please reevaluate and make sure he follows up with orthopedic.   found  Started on Crestor 20 mg QD on this admission and will discharge patient with ASA 81 mg QD.  2- Had elevated BP at 170s during this hospitalization. On no treatment at home. Started Lisinopril 20 mg QD. Please evaluate the BP.  2.  Labs / imaging  needed at time of follow-up:BMP  3.  Pending labs/ test needing follow-up: None  Follow-up Appointments: Follow-up Information    Alba Cory, MD.   Specialty:  Family Medicine Contact information: [address] [place] [address] [phone]        Nadara Mustard, MD. Schedule an appointment as soon as possible for a visit in 1 week(s).   Specialty:  Orthopedic Surgery Contact information: [address] [place] [address] [phone]           Hospital Course by problem list: 1. Right foot cellulitis and right third toe osteomyelitis: 58 year old male with past medical history of DM type II (not on medication) and history of left second distal phalanx mutation, presented with worsening of right third toe erythema, swelling and open ulcer on tip of same toe. He did not have any systemic symptoms. No fever, no leukocytosis. Foot MR showed osteomyelitis of the right third distal phalanx, without abscess. He started on IV Ceftriaxon, Vancomycina dn Metronidazole. Cellulitis resolved but he developed some fluctuation on exam and orthopedic surgeon planned to do amputation. Patient did well after surgery and discharged without antibiotic.  2- HLD: Patient noted to have an elevated LDL. As well as abnormal  toe to brachial index at ABI  We started the patient on Crestor and ASA.  3- HTN: Patient noted to be hypertensive throughout his admission.  started on Lisinopril 20 mg QD.  Discharge Vitals:   BP 131/81 (BP Location: Left Arm)   Pulse 60   Temp 98.5 F (36.9 C) (Oral)   Resp 18   Ht 6' (1.829 m)   Wt 103 kg   SpO2 96%   BMI 30.80 kg/m    Pertinent Labs, Studies, and Procedures:   HbA1c:6.7 LDL:157 HDL:47 MR right foot:11/05/2018: Soft tissue ulceration at the tip of the third toe with underlying osteomyelitis of the third distal phalanx. No abscess. Nonspecific, nonaggressive appearing 9 mm lesion in the base of the second  metatarsal, potentially a small cyst related to old trauma or stress injury.  Bilateral ABI: The right toe-brachial index is abnormal.  Discharge Instructions: Discharge Instructions    Call MD for:  severe uncontrolled pain   Complete by:  As directed    Call MD for:  temperature >100.4   Complete by:  As directed    Diet - low sodium heart healthy   Complete by:  As directed    Discharge instructions   Complete by:  As directed    You for allowing [place] taking care of you at [facility].  You came in due to right third toes redness and swelling and found to have bone infection.  You are glad that you are doing well after amputation surgery.  Please make sure to follow-up with your orthopedic surgery.  Your leg ultrasound also showed some peripheral artery disease. As we discussed, you start aspirin, blood pressure medication (lisinopril) as well as cholesterol medication (Crestor), please take them as instructed and make sure to follow-up with a primary care within 2 weeks.  We can call us at [phone] if you have any question or concern.   Increase activity slowly   Complete by:  As directed    Touch down weight bearing   Complete by:  As directed    Laterality:  right   Extremity:  Lower      Signed: Chevis Pretty, MD 11/16/2018, 9:43 PM   Pager: [phone]

## 2018-11-06 NOTE — Consult Note (Signed)
ORTHOPAEDIC CONSULTATION  REQUESTING PHYSICIAN: Earl Lagos, MD  Chief Complaint: Ulceration pain and swelling right foot third toe  HPI: Anthony Park is a 58 y.o. male who presents with osteomyelitis right foot third toe.  Patient is diabetic he states that it is well controlled with diet he has a history of hypertension as well as high cholesterol and is a smoker.  He is status post amputation partially of the great toe and second toe of the left foot.  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  Past Medical History:  Diagnosis Date  . Diabetes mellitus without complication (HCC)   . Hypertension    Past Surgical History:  Procedure Laterality Date  . TOE AMPUTATION Left 2015   Social History   Socioeconomic History  . Marital status: Married    Spouse name: Not on file  . Number of children: Not on file  . Years of education: Not on file  . Highest education level: Not on file  Occupational History  . Not on file  Social Needs  . Financial resource strain: Not on file  . Food insecurity:    Worry: Not on file    Inability: Not on file  . Transportation needs:    Medical: Not on file    Non-medical: Not on file  Tobacco Use  . Smoking status: Not on file  Substance and Sexual Activity  . Alcohol use: Not on file  . Drug use: Not on file  . Sexual activity: Not on file  Lifestyle  . Physical activity:    Days per week: Not on file    Minutes per session: Not on file  . Stress: Not on file  Relationships  . Social connections:    Talks on phone: Not on file    Gets together: Not on file    Attends religious service: Not on file    Active member of club or organization: Not on file    Attends meetings of clubs or organizations: Not on file    Relationship status: Not on file  Other Topics Concern  . Not on file  Social History Narrative  . Not on file   No family history on file. - negative except  otherwise stated in the family history section No Known Allergies Prior to Admission medications   Medication Sig Start Date End Date Taking? Authorizing Provider  Multiple Vitamins-Minerals (MULTIVITAMIN WITH MINERALS) tablet Take 1 tablet by mouth daily.   Yes [provider]  TURMERIC PO Take 1 capsule by mouth every other day.   Yes [provider]   Mr Foot Right Wo Contrast  Result Date: 11/05/2018 CLINICAL DATA:  Third toe infection. EXAM: MRI OF THE RIGHT FOREFOOT WITHOUT CONTRAST TECHNIQUE: Multiplanar, multisequence MR imaging of the right forefoot was performed. No intravenous contrast was administered. COMPARISON:  Right third toe x-rays from same day. FINDINGS: Bones/Joint/Cartilage Marrow edema throughout the third distal phalanx with cortical erosion and loss of the normal T1 marrow signal at the tip of the phalanx, consistent with osteomyelitis. There is a oval 8 x 6 x 9 mm T2 hyperintense, T1 hypointense lesion in the base of the second metatarsal without osseous destruction or periosteal reaction. No fracture or dislocation. Normal alignment. No joint effusion. Ligaments Collateral ligaments are intact. Muscles and Tendons Flexor and extensor compartment tendons are intact. Increased T2 signal within the intrinsic muscles of the forefoot, nonspecific, but likely  related to diabetic muscle changes. Soft tissue Soft tissue ulceration at the tip of the third toe. No fluid collection or hematoma. No soft tissue mass. IMPRESSION: 1. Soft tissue ulceration at the tip of the third toe with underlying osteomyelitis of the third distal phalanx. No abscess. 2. Nonspecific, nonaggressive appearing 9 mm lesion in the base of the second metatarsal, potentially a small cyst related to old trauma or stress injury. Electronically Signed   By: William T Derry M.D.   On: 11/05/2018 13:43   Dg Toe 3rd Right  Result Date: 11/05/2018 CLINICAL DATA:  Ulcer to the middle right toe. EXAM:  RIGHT THIRD TOE COMPARISON:  None. FINDINGS: Soft tissue ulcer and swelling of the tuft of the third right digit. There is a small area of possible cortical erosion of the distal right third phalanx, with which osteomyelitis can not be excluded. Vascular calcifications noted. IMPRESSION: Soft tissue ulcer and swelling of the tuft of the third right digit. There is a small area of possible cortical erosion of the distal right third phalanx, with which osteomyelitis can not be excluded. Electronically Signed   By: Dobrinka  Dimitrova M.D.   On: 11/05/2018 11:37   - pertinent xrays, CT, MRI studies were reviewed and independently interpreted  Positive ROS: All other systems have been reviewed and were otherwise negative with the exception of those mentioned in the HPI and as above.  Physical Exam: General: Alert, no acute distress Psychiatric: Patient is competent for consent with normal mood and affect Lymphatic: No axillary or cervical lymphadenopathy Cardiovascular: No pedal edema Respiratory: No cyanosis, no use of accessory musculature GI: No organomegaly, abdomen is soft and non-tender    Images:  @ENCIMAGES@  Labs:  Lab Results  Component Value Date   HGBA1C 6.7 (H) 11/05/2018    Lab Results  Component Value Date   ALBUMIN 3.5 11/06/2018    Neurologic: Patient does not have protective sensation bilateral lower extremities.   MUSCULOSKELETAL:   Skin: Examination patient has swelling ulceration to the right foot third toe.  The amputations partially of the great toe and second toe left foot of well-healed.  Patient has a good dorsalis pedis and posterior tibial pulse on the right foot no ascending cellulitis.  Review of the MRI scan shows osteomyelitis of the distal phalanx of the right third toe.  There is some edema and the second metatarsal shaft but this appears to be more of a stress reaction than osteomyelitis.  There is no abscess.  Assessment: Assessment:  Diabetic insensate neuropathy with hypertension high cholesterol smoking and osteomyelitis of the third toe right foot.  Plan: Plan: We will plan for amputation of the right foot third toe either on Tuesday or Wednesday.  Continue IV antibiotics until surgery and anticipate patient could be discharged without antibiotics after surgery.  Patient will need medication for his hypertension and cholesterol.  Thank you for the consult and the opportunity to see Mr. Dorval   , MD [facility] [phone] 12:29 PM     

## 2018-11-07 ENCOUNTER — Encounter (HOSPITAL_COMMUNITY)
Admission: EM | Disposition: A | Discharge status: Home / Self Care | Payer: Self-pay | Source: Home / Self Care | Attending: Internal Medicine

## 2018-11-07 ENCOUNTER — Other Ambulatory Visit: Payer: Self-pay | Admitting: Internal Medicine

## 2018-11-07 ENCOUNTER — Inpatient Hospital Stay (HOSPITAL_COMMUNITY): Payer: BLUE CROSS/BLUE SHIELD | Admitting: Anesthesiology

## 2018-11-07 DIAGNOSIS — M869 Osteomyelitis, unspecified: Secondary | ICD-10-CM

## 2018-11-07 DIAGNOSIS — E785 Hyperlipidemia, unspecified: Secondary | ICD-10-CM

## 2018-11-07 DIAGNOSIS — Z89421 Acquired absence of other right toe(s): Secondary | ICD-10-CM

## 2018-11-07 DIAGNOSIS — Z79899 Other long term (current) drug therapy: Secondary | ICD-10-CM

## 2018-11-07 DIAGNOSIS — L03031 Cellulitis of right toe: Secondary | ICD-10-CM

## 2018-11-07 HISTORY — PX: AMPUTATION: SHX166

## 2018-11-07 LAB — GLUCOSE, CAPILLARY
GLUCOSE-CAPILLARY: 114 mg/dL — AB (ref 70–99)
Glucose-Capillary: 119 mg/dL — ABNORMAL HIGH (ref 70–99)
Glucose-Capillary: 117 mg/dL — ABNORMAL HIGH (ref 70–99)

## 2018-11-07 LAB — SURGICAL PCR SCREEN
MRSA, PCR: NEGATIVE
Staphylococcus aureus: NEGATIVE

## 2018-11-07 SURGERY — AMPUTATION DIGIT
Anesthesia: Regional | Laterality: Right

## 2018-11-07 MED ORDER — BISACODYL 10 MG RE SUPP: 10.0000 mg | Freq: Every day | RECTAL | Status: DC | PRN

## 2018-11-07 MED ORDER — HYDROMORPHONE HCL 1 MG/ML IJ SOLN: 0.2500 mg | INTRAMUSCULAR | Status: DC | PRN

## 2018-11-07 MED ORDER — ENOXAPARIN SODIUM 40 MG/0.4ML ~~LOC~~ SOLN: 40.0000 mg | SUBCUTANEOUS | Status: DC

## 2018-11-07 MED ORDER — MAGNESIUM CITRATE PO SOLN: 1.0000 | Freq: Once | ORAL | Status: DC | PRN

## 2018-11-07 MED ORDER — ROSUVASTATIN CALCIUM 20 MG PO TABS: 20.0000 mg | ORAL_TABLET | Freq: Every day | ORAL | 0 refills | Status: DC

## 2018-11-07 MED ORDER — DOCUSATE SODIUM 100 MG PO CAPS
100.0000 mg | ORAL_CAPSULE | Freq: Two times a day (BID) | ORAL | Status: DC
  Filled 2018-11-07: qty 1

## 2018-11-07 MED ORDER — METHOCARBAMOL 1000 MG/10ML IJ SOLN
500.0000 mg | Freq: Four times a day (QID) | INTRAVENOUS | Status: DC | PRN
  Filled 2018-11-07: qty 5

## 2018-11-07 MED ORDER — OXYCODONE HCL 5 MG PO TABS: 5.0000 mg | ORAL_TABLET | Freq: Four times a day (QID) | ORAL | 0 refills | Status: DC | PRN

## 2018-11-07 MED ORDER — OXYCODONE HCL 5 MG PO TABS
10.0000 mg | ORAL_TABLET | ORAL | Status: DC | PRN
  Filled 2018-11-07: qty 3

## 2018-11-07 MED ORDER — MIDAZOLAM HCL 2 MG/2ML IJ SOLN
INTRAMUSCULAR | Status: AC
  Filled 2018-11-07: qty 2

## 2018-11-07 MED ORDER — MIDAZOLAM HCL 2 MG/2ML IJ SOLN
INTRAMUSCULAR | Status: AC
  Administered 2018-11-07: 2 mg via INTRAVENOUS
  Filled 2018-11-07: qty 2

## 2018-11-07 MED ORDER — LACTATED RINGERS IV SOLN
INTRAVENOUS | Status: DC | PRN
  Administered 2018-11-07: 09:00:00 via INTRAVENOUS

## 2018-11-07 MED ORDER — ACETAMINOPHEN 10 MG/ML IV SOLN: 1000.0000 mg | Freq: Once | INTRAVENOUS | Status: DC | PRN

## 2018-11-07 MED ORDER — ACETAMINOPHEN 325 MG PO TABS: 325.0000 mg | ORAL_TABLET | Freq: Four times a day (QID) | ORAL | Status: DC | PRN

## 2018-11-07 MED ORDER — FENTANYL CITRATE (PF) 100 MCG/2ML IJ SOLN
INTRAMUSCULAR | Status: AC
  Administered 2018-11-07: 50 ug via INTRAVENOUS
  Filled 2018-11-07: qty 2

## 2018-11-07 MED ORDER — ASPIRIN EC 81 MG PO TBEC: 81.0000 mg | DELAYED_RELEASE_TABLET | Freq: Every day | ORAL | 0 refills | Status: AC

## 2018-11-07 MED ORDER — SODIUM CHLORIDE 0.9 % IV SOLN: INTRAVENOUS | Status: DC

## 2018-11-07 MED ORDER — MEPERIDINE HCL 50 MG/ML IJ SOLN: 6.2500 mg | INTRAMUSCULAR | Status: DC | PRN

## 2018-11-07 MED ORDER — METOCLOPRAMIDE HCL 5 MG/ML IJ SOLN: 5.0000 mg | Freq: Three times a day (TID) | INTRAMUSCULAR | Status: DC | PRN

## 2018-11-07 MED ORDER — LISINOPRIL 20 MG PO TABS: 20.0000 mg | ORAL_TABLET | Freq: Every day | ORAL | 0 refills | Status: DC

## 2018-11-07 MED ORDER — PROPOFOL 500 MG/50ML IV EMUL
INTRAVENOUS | Status: DC | PRN
  Administered 2018-11-07: 100 ug/kg/min via INTRAVENOUS

## 2018-11-07 MED ORDER — ONDANSETRON HCL 4 MG/2ML IJ SOLN: 4.0000 mg | Freq: Four times a day (QID) | INTRAMUSCULAR | Status: DC | PRN

## 2018-11-07 MED ORDER — PROPOFOL 10 MG/ML IV BOLUS
INTRAVENOUS | Status: AC
  Filled 2018-11-07: qty 20

## 2018-11-07 MED ORDER — FENTANYL CITRATE (PF) 250 MCG/5ML IJ SOLN
INTRAMUSCULAR | Status: AC
  Filled 2018-11-07: qty 5

## 2018-11-07 MED ORDER — ACETAMINOPHEN 325 MG PO TABS: 650.0000 mg | ORAL_TABLET | Freq: Four times a day (QID) | ORAL | 0 refills | Status: DC | PRN

## 2018-11-07 MED ORDER — OXYCODONE HCL 5 MG PO TABS: 5.0000 mg | ORAL_TABLET | ORAL | Status: DC | PRN

## 2018-11-07 MED ORDER — HYDROCODONE-ACETAMINOPHEN 7.5-325 MG PO TABS: 1.0000 | ORAL_TABLET | Freq: Once | ORAL | Status: DC | PRN

## 2018-11-07 MED ORDER — METOCLOPRAMIDE HCL 5 MG PO TABS: 5.0000 mg | ORAL_TABLET | Freq: Three times a day (TID) | ORAL | Status: DC | PRN

## 2018-11-07 MED ORDER — ONDANSETRON HCL 4 MG PO TABS: 4.0000 mg | ORAL_TABLET | Freq: Four times a day (QID) | ORAL | Status: DC | PRN

## 2018-11-07 MED ORDER — ONDANSETRON HCL 4 MG/2ML IJ SOLN
INTRAMUSCULAR | Status: DC | PRN
  Administered 2018-11-07: 4 mg via INTRAVENOUS

## 2018-11-07 MED ORDER — PROPOFOL 10 MG/ML IV BOLUS
INTRAVENOUS | Status: DC | PRN
  Administered 2018-11-07: 40 mg via INTRAVENOUS

## 2018-11-07 MED ORDER — CLONIDINE HCL (ANALGESIA) 100 MCG/ML EP SOLN
EPIDURAL | Status: DC | PRN
  Administered 2018-11-07: 100 ug

## 2018-11-07 MED ORDER — MIDAZOLAM HCL 2 MG/2ML IJ SOLN
2.0000 mg | Freq: Once | INTRAMUSCULAR | Status: AC
  Administered 2018-11-07: 2 mg via INTRAVENOUS
  Filled 2018-11-07: qty 2

## 2018-11-07 MED ORDER — POLYETHYLENE GLYCOL 3350 17 G PO PACK: 17.0000 g | PACK | Freq: Every day | ORAL | Status: DC | PRN

## 2018-11-07 MED ORDER — HYDROMORPHONE HCL 1 MG/ML IJ SOLN: 0.5000 mg | INTRAMUSCULAR | Status: DC | PRN

## 2018-11-07 MED ORDER — FENTANYL CITRATE (PF) 250 MCG/5ML IJ SOLN
INTRAMUSCULAR | Status: DC | PRN
  Administered 2018-11-07: 50 ug via INTRAVENOUS

## 2018-11-07 MED ORDER — FENTANYL CITRATE (PF) 100 MCG/2ML IJ SOLN
50.0000 ug | Freq: Once | INTRAMUSCULAR | Status: AC
  Administered 2018-11-07: 50 ug via INTRAVENOUS
  Filled 2018-11-07: qty 1

## 2018-11-07 MED ORDER — PROMETHAZINE HCL 25 MG/ML IJ SOLN: 6.2500 mg | INTRAMUSCULAR | Status: DC | PRN

## 2018-11-07 MED ORDER — ONDANSETRON HCL 4 MG/2ML IJ SOLN
INTRAMUSCULAR | Status: AC
  Filled 2018-11-07: qty 2

## 2018-11-07 MED ORDER — METHOCARBAMOL 500 MG PO TABS: 500.0000 mg | ORAL_TABLET | Freq: Four times a day (QID) | ORAL | Status: DC | PRN

## 2018-11-07 MED ORDER — ROPIVACAINE HCL 5 MG/ML IJ SOLN
INTRAMUSCULAR | Status: DC | PRN
  Administered 2018-11-07: 30 mL via PERINEURAL

## 2018-11-07 SURGICAL SUPPLY — 34 items
BLADE SURG 21 STRL SS (BLADE) ×2 IMPLANT
BNDG COHESIVE 3X5 TAN STRL LF (GAUZE/BANDAGES/DRESSINGS) ×1 IMPLANT
BNDG COHESIVE 4X5 TAN STRL (GAUZE/BANDAGES/DRESSINGS) ×2 IMPLANT
BNDG ESMARK 4X9 LF (GAUZE/BANDAGES/DRESSINGS) IMPLANT
BNDG GAUZE ELAST 4 BULKY (GAUZE/BANDAGES/DRESSINGS) ×2 IMPLANT
COVER SURGICAL LIGHT HANDLE (MISCELLANEOUS) ×4 IMPLANT
COVER WAND RF STERILE (DRAPES) ×1 IMPLANT
DRAPE U-SHAPE 47X51 STRL (DRAPES) ×2 IMPLANT
DRSG ADAPTIC 3X8 NADH LF (GAUZE/BANDAGES/DRESSINGS) IMPLANT
DRSG PAD ABDOMINAL 8X10 ST (GAUZE/BANDAGES/DRESSINGS) ×2 IMPLANT
DURAPREP 26ML APPLICATOR (WOUND CARE) ×2 IMPLANT
ELECT REM PT RETURN 9FT ADLT (ELECTROSURGICAL) ×2
ELECTRODE REM PT RTRN 9FT ADLT (ELECTROSURGICAL) ×1 IMPLANT
GAUZE SPONGE 4X4 12PLY STRL (GAUZE/BANDAGES/DRESSINGS) IMPLANT
GLOVE BIOGEL PI IND STRL 7.0 (GLOVE) IMPLANT
GLOVE BIOGEL PI IND STRL 9 (GLOVE) ×1 IMPLANT
GLOVE BIOGEL PI INDICATOR 7.0 (GLOVE) ×1
GLOVE BIOGEL PI INDICATOR 9 (GLOVE) ×1
GLOVE SURG ORTHO 9.0 STRL STRW (GLOVE) ×2 IMPLANT
GOWN STRL REUS W/ TWL LRG LVL3 (GOWN DISPOSABLE) IMPLANT
GOWN STRL REUS W/ TWL XL LVL3 (GOWN DISPOSABLE) ×2 IMPLANT
GOWN STRL REUS W/TWL LRG LVL3 (GOWN DISPOSABLE) ×1
GOWN STRL REUS W/TWL XL LVL3 (GOWN DISPOSABLE) ×1
KIT BASIN OR (CUSTOM PROCEDURE TRAY) ×2 IMPLANT
KIT TURNOVER KIT B (KITS) ×2 IMPLANT
MANIFOLD NEPTUNE II (INSTRUMENTS) ×1 IMPLANT
NEEDLE 22X1 1/2 (OR ONLY) (NEEDLE) IMPLANT
NS IRRIG 1000ML POUR BTL (IV SOLUTION) ×2 IMPLANT
PACK ORTHO EXTREMITY (CUSTOM PROCEDURE TRAY) ×2 IMPLANT
PAD ABD 7.5X8 STRL (GAUZE/BANDAGES/DRESSINGS) ×1 IMPLANT
PAD ARMBOARD 7.5X6 YLW CONV (MISCELLANEOUS) ×4 IMPLANT
SUT ETHILON 2 0 PSLX (SUTURE) ×2 IMPLANT
SYR CONTROL 10ML LL (SYRINGE) IMPLANT
TOWEL OR 17X26 10 PK STRL BLUE (TOWEL DISPOSABLE) ×2 IMPLANT

## 2018-11-07 NOTE — Op Note (Signed)
11/07/2018  9:48 AM  PATIENT:  Anthony Park    PRE-OPERATIVE DIAGNOSIS:  Osteomyelitis Right 3rd Toe  POST-OPERATIVE DIAGNOSIS:  Same  PROCEDURE:  RIGHT 3RD TOE AMPUTATION  SURGEON:  Nadara Mustard, MD  PHYSICIAN ASSISTANT:None ANESTHESIA:   General  PREOPERATIVE INDICATIONS:  Anthony Park is a  58 y.o. male with a diagnosis of Osteomyelitis Right 3rd Toe who failed conservative measures and elected for surgical management.    The risks benefits and alternatives were discussed with the patient preoperatively including but not limited to the risks of infection, bleeding, nerve injury, cardiopulmonary complications, the need for revision surgery, among others, and the patient was willing to proceed.  OPERATIVE IMPLANTS: None  @ENCIMAGES @  OPERATIVE FINDINGS: No abscess at the MTP joint good petechial bleeding  OPERATIVE PROCEDURE: Patient was brought to the operating room after regional anesthetic.  After adequate levels anesthesia were obtained patient's right lower extremity was prepped using ChloraPrep draped in the sterile field a timeout was called.  AV incision was made around the third toe this was carried down to the MTP joint.  The toe was amputated through the MTP joint.  There is no signs of infection at this site.  The wound was irrigated with normal saline electrocautery was used for hemostasis wound was closed using 2-0 nylon.  Sterile dressing was applied patient was taken the PACU in stable condition.   DISCHARGE PLANNING:  Antibiotic duration: May discontinue IV antibiotics postoperatively.  Weightbearing: Touchdown weightbearing on the right  Pain medication: Orders written for opioid pathway  Dressing care/ Wound VAC: Dressing to be changed in the office at follow-up in a week.  Ambulatory devices: Walker or crutches.  Discharge to: Patient may be discharged to home once he is safe with ambulation.  Patient may discharge to home today.  Follow-up: In  the office 1 week post operative.

## 2018-11-07 NOTE — Interval H&P Note (Signed)
History and Physical Interval Note:  11/07/2018 9:13 AM  Anthony Park  has presented today for surgery, with the diagnosis of Osteomyelitis Right 3rd Toe  The various methods of treatment have been discussed with the patient and family. After consideration of risks, benefits and other options for treatment, the patient has consented to  Procedure(s): RIGHT 3RD TOE AMPUTATION (Right) as a surgical intervention .  The patient's history has been reviewed, patient examined, no change in status, stable for surgery.  I have reviewed the patient's chart and labs.  Questions were answered to the patient's satisfaction.     Nadara Mustard

## 2018-11-07 NOTE — Anesthesia Procedure Notes (Signed)
Anesthesia Regional Block: Popliteal block   Pre-Anesthetic Checklist: ,, timeout performed, Correct Patient, Correct Site, Correct Laterality, Correct Procedure, Correct Position, site marked, Risks and benefits discussed, pre-op evaluation,  At surgeon's request and post-op pain management  Laterality: Right  Prep: Maximum Sterile Barrier Precautions used, chloraprep       Needles:  Injection technique: Single-shot  Needle Type: Echogenic Needle     Needle Length: 9cm  Needle Gauge: 21     Additional Needles:   Procedures:,,,, ultrasound used (permanent image in chart),,,,  Narrative:  Start time: 11/07/2018 8:46 AM End time: 11/07/2018 8:55 AM Injection made incrementally with aspirations every 5 mL.  Performed by: Personally  Anesthesiologist: Trevor Iha, MD  Additional Notes: Block assessed. Patient tolerated procedure well.

## 2018-11-07 NOTE — Evaluation (Addendum)
Physical Therapy Evaluation and Discharge Patient Details Name: Anthony Park MRN: [id number] DOB: 01/15/1961 Today's Date: 11/07/2018   History of Present Illness  Pt is a 58 y/o male with a PMH significant for DM, HTN, and amputation partially of great toe and second toe of L foot. Pt presents with osteomyelitis Right 3rd Toe, s/p amputation on 11/07/2018.   Clinical Impression  Patient evaluated by Physical Therapy with no further acute PT needs identified. All education has been completed and the patient has no further questions. At the time of PT eval pt was able to perform transfers and ambulation with gross modified independence to independence and RW for support. Pt with overall poor safety awareness and required frequent cues for safety and maintenance of TDWB status on the RLE. Pt was educated verbally on stair training (refused practicing), car transfer, and general precautions. See below for any follow-up Physical Therapy or equipment needs. PT is signing off. Thank you for this referral.        Follow Up Recommendations No PT follow up;Supervision - Intermittent (Pt refusing HHPT and outpatient PT)    Equipment Recommendations  Rolling walker with 5" wheels    Recommendations for Other Services       Precautions / Restrictions Precautions Precautions: Fall Restrictions Weight Bearing Restrictions: Yes RLE Weight Bearing: Touchdown weight bearing      Mobility  Bed Mobility Overal bed mobility: Independent                Transfers Overall transfer level: Modified independent Equipment used: Rolling walker (2 wheeled)             General transfer comment: VC's for safety. Pt did not require assist  Ambulation/Gait Ambulation/Gait assistance: Modified independent (Device/Increase time) Gait Distance (Feet): 50 Feet Assistive device: Rolling walker (2 wheeled) Gait Pattern/deviations: Step-to pattern;Decreased stride length Gait velocity:  Decreased Gait velocity interpretation: 1.31 - 2.62 ft/sec, indicative of limited community ambulator General Gait Details: VC's for TDWB status. Pt appears to be putting weight through the RLE however pt insistent that he is maintaining TDWB status. No unsteadiness or LOB noted.   Stairs         General stair comments: Pt refused stair training, however we verbally reviewed negotiating stairs and maintaining TDWB status.   Wheelchair Mobility    Modified Rankin (Stroke Patients Only)       Balance Overall balance assessment: Needs assistance Sitting-balance support: Feet supported;No upper extremity supported Sitting balance-Leahy Scale: Good     Standing balance support: Bilateral upper extremity supported;During functional activity Standing balance-Leahy Scale: Poor Standing balance comment: Reliant on UE support                             Pertinent Vitals/Pain Pain Assessment: Faces Faces Pain Scale: Hurts a little bit Pain Location: R foot Pain Descriptors / Indicators: Operative site guarding;Numbness Pain Intervention(s): Limited activity within patient's tolerance;Monitored during session;Repositioned    Home Living Family/patient expects to be discharged to:: Private residence Living Arrangements: Spouse/significant other Available Help at Discharge: Family Type of Home: House Home Access: Stairs to enter   Secretary/administrator of Steps: 3 Home Layout: One level Home Equipment: Shower seat - built in;Grab bars - tub/shower      Prior Function Level of Independence: Independent               Hand Dominance        Extremity/Trunk Assessment  Upper Extremity Assessment Upper Extremity Assessment: Overall WFL for tasks assessed    Lower Extremity Assessment Lower Extremity Assessment: RLE deficits/detail RLE Deficits / Details: Decreased strength and AROM consistent with above mentioned diagnosis and procedure.  RLE Sensation:  (still numb from surgery)    Cervical / Trunk Assessment Cervical / Trunk Assessment: Normal  Communication   Communication: No difficulties  Cognition Arousal/Alertness: Awake/alert Behavior During Therapy: Anxious;Impulsive Overall Cognitive Status: Within Functional Limits for tasks assessed                                        General Comments      Exercises     Assessment/Plan    PT Assessment Patent does not need any further PT services  PT Problem List         PT Treatment Interventions      PT Goals (Current goals can be found in the Care Plan section)  Acute Rehab PT Goals Patient Stated Goal: Home today PT Goal Formulation: All assessment and education complete, DC therapy    Frequency     Barriers to discharge        Co-evaluation               AM-PAC PT "6 Clicks" Mobility  Outcome Measure Help needed turning from your back to your side while in a flat bed without using bedrails?: None Help needed moving from lying on your back to sitting on the side of a flat bed without using bedrails?: None Help needed moving to and from a bed to a chair (including a wheelchair)?: None Help needed standing up from a chair using your arms (e.g., wheelchair or bedside chair)?: None Help needed to walk in hospital room?: None Help needed climbing 3-5 steps with a railing? : A Little 6 Click Score: 23    End of Session Equipment Utilized During Treatment: Gait belt Activity Tolerance: Patient tolerated treatment well Patient left: with family/visitor present(Sitting EOB awaiting d/c instructions. Wife present) Nurse Communication: Mobility status PT Visit Diagnosis: Pain;Difficulty in walking, not elsewhere classified (R26.2) Pain - Right/Left: Right Pain - part of body: Ankle and joints of foot    Time: 1353-1410 PT Time Calculation (min) (ACUTE ONLY): 17 min   Charges:   PT Evaluation $PT Eval Low Complexity: 1 Low           Conni Slipper, PT, DPT Acute Rehabilitation Services Pager: [phone] Office: [phone]   Anthony Park 11/07/2018, 2:55 PM

## 2018-11-07 NOTE — Care Management Note (Signed)
Case Management Note Hortencia Conradi, RN MSN CCM Transitions of Care 1[facility] [place] [phone]  Patient Details  Name: MAVEN CHALMERS MRN: [id number] Date of Birth: [birth year]  Subjective/Objective:             Diabetic toe ulcer     Action/Plan: Spoke with patient and spouse at bedside. PTA patient home with spouse-independent-working. Declined need for HHPT or outpatient PT. Accepted referral for DME-RW-agreed to using [facility]. Call made for RW to be delivered. No medication needs. States prescriptions to be electronically sent to pharmacy. Asked about paperwork detailing events of this hospitalization for his short term disability case. Advised to set up My Chart. Encouraged f/u with PCP-reports needing a closer PCP-advised to call number on insurance card to ensure in network status. Discussed that outpatient PCP was the best contact for any disability claims. Spouse at bedside and able to provide transportation home. No other transition of care needs identified.   Expected Discharge Date:  11/07/18               Expected Discharge Plan:  Home/Self Care  In-House Referral:  PCP / [facility]  Discharge planning Services  CM Consult  Post Acute Care Choice:  Durable Medical Equipment Choice offered to:  Patient, Spouse  DME Arranged:  Walker rolling DME Agency:  [facility]  HH Arranged:  NA HH Agency:  NA  Status of Service:  Completed, signed off  If discussed at Long Length of Stay Meetings, dates discussed:    Additional Comments:  Bess Kinds, RN 11/07/2018, 2:31 PM

## 2018-11-07 NOTE — Progress Notes (Signed)
Orthopedic Tech Progress Note Patient Details:  DACK RIVETT 22-Nov-1960 [id number]  Ortho Devices Type of Ortho Device: Postop shoe/boot Ortho Device/Splint Interventions: Ordered, Application, Adjustment   Post Interventions Patient Tolerated: Well Instructions Provided: Care of device, Adjustment of device   Kirstan Fentress J Mekaylah Klich 11/07/2018, 12:18 PM

## 2018-11-07 NOTE — Transfer of Care (Signed)
Immediate Anesthesia Transfer of Care Note  Patient: Anthony Park  Procedure(s) Performed: RIGHT 3RD TOE AMPUTATION (Right )  Patient Location: PACU  Anesthesia Type:General  Level of Consciousness: awake, alert  and oriented  Airway & Oxygen Therapy: Patient Spontanous Breathing and Patient connected to face mask oxygen  Post-op Assessment: Report given to RN and Post -op Vital signs reviewed and stable  Post vital signs: Reviewed and stable  Last Vitals:  Vitals Value Taken Time  BP 133/89 11/07/2018  9:47 AM  Temp 36.5 C 11/07/2018  9:47 AM  Pulse 60 11/07/2018  9:47 AM  Resp 11 11/07/2018  9:47 AM  SpO2 98 % 11/07/2018  9:47 AM  Vitals shown include unvalidated device data.  Last Pain:  Vitals:   11/07/18 0500  TempSrc: Oral  PainSc:          Complications: No apparent anesthesia complications

## 2018-11-07 NOTE — Anesthesia Postprocedure Evaluation (Signed)
Anesthesia Post Note  Patient: Anthony Park  Procedure(s) Performed: RIGHT 3RD TOE AMPUTATION (Right )     Patient location during evaluation: PACU Anesthesia Type: Regional Level of consciousness: awake and alert Pain management: pain level controlled Vital Signs Assessment: post-procedure vital signs reviewed and stable Respiratory status: spontaneous breathing, nonlabored ventilation, respiratory function stable and patient connected to nasal cannula oxygen Cardiovascular status: stable and blood pressure returned to baseline Postop Assessment: no apparent nausea or vomiting Anesthetic complications: no    Last Vitals:  Vitals:   11/07/18 0955 11/07/18 1015  BP: 131/89 131/81  Pulse: (!) 58 60  Resp: 16 18  Temp: 36.5 C 36.9 C  SpO2: 96% 96%    Last Pain:  Vitals:   11/07/18 1015  TempSrc: Oral  PainSc:                  Trevor Iha

## 2018-11-07 NOTE — Progress Notes (Signed)
RN verified the presence of a signed informed consent that matches stated procedure by patient. Verified armband matches patient's stated name and birth date. Verified NPO status and that all jewelry, contact, glasses, dentures, and partials had been removed (if applicable).  

## 2018-11-07 NOTE — Progress Notes (Signed)
   Subjective: saw and evaluated patient after surgery. He is doing well. PT is in room and working with him. No acute complaint. We discussed about discharging him today. Reminded him that we started him with new medications and he needs to have a PCP for follow up. He and his wife agree.  Objective:  Vital signs in last 24 hours: Vitals:   11/06/18 0503 11/06/18 0856 11/06/18 1820 11/06/18 2151  BP: (!) 172/99 (!) 172/103 (!) 173/98 (!) 162/104  Pulse: 64 61 68 62  Resp: 18 18 18 20   Temp: 98.5 F (36.9 C) 98.7 F (37.1 C) 98.9 F (37.2 C) 98.1 F (36.7 C)  TempSrc: Oral Oral Oral Oral  SpO2: 97% 98% 97% 94%  Weight:      Height:       Physical Exam  Constitutional: Appears well-developed and well-nourished. No distress.  HENT:  Head: Normocephalic and atraumatic.  Eyes: Conjunctivae are normal.  Cardiovascular: Normal rate, regular rhythm and normal heart sounds.  Respiratory: Effort normal and breath sounds normal. No respiratory distress. No wheezes.  GI: Soft. Bowel sounds are normal. No distension. There is no tenderness.  Musculoskeletal: right foot is wrapped after surgery. Left foot:first and second toe distal phalanx are amputated Neurological: Is alert.  Skin: Not diaphoretic. No erythema.  Psychiatric: Normal mood and affect. Behavior is normal. Judgment and thought content normal.    Assessment/Plan:  Active Problems:   Diabetic toe ulcer (HCC)   Cellulitis of third toe of right foot  58 year old male with past medical history of DM type II (not on medication) and history of left second distal phalanx mutation, presented with right third toe erythema, swelling and open ulcer.  Right third toe osteomyelitisosteomyelitis: Likely secondary to PAD and type 2 DM S/p amputation today. Doing well and PT is in the room working with him. Orthopedic discharged him. No antibiotic is needed.  ABI showed abnormal left toe/brachial. We start ASA at  discharge.  -D/c to home today -OXY IR q 6h for pain x 5 days -No Antibiotic needed  -Continue Crestor at home   DMII.  BG at131 and 205 on SSI.  -Not on medication at home. recommended to f/u with PCP for management -Consult case manager to assist finding PCP  HTN: improved after starting Lisinopril -discharge with Lisinopril 10 mg QD    Dispo: D/C home today   Chevis Pretty, MD 11/07/2018, 4:56 AM Pager: [phone]

## 2018-11-08 ENCOUNTER — Encounter (HOSPITAL_COMMUNITY): Payer: Self-pay | Admitting: Orthopedic Surgery

## 2018-11-09 ENCOUNTER — Telehealth (INDEPENDENT_AMBULATORY_CARE_PROVIDER_SITE_OTHER): Payer: Self-pay | Admitting: Orthopedic Surgery

## 2018-11-09 ENCOUNTER — Telehealth: Payer: Self-pay

## 2018-11-09 ENCOUNTER — Telehealth (INDEPENDENT_AMBULATORY_CARE_PROVIDER_SITE_OTHER): Payer: Self-pay

## 2018-11-09 NOTE — Telephone Encounter (Signed)
Pt called saying he has had a little bleeding and he is wondering if it is okay to still wait till his apt Thursday or should he come in to get a new dressing.

## 2018-11-09 NOTE — Telephone Encounter (Signed)
Pt has an appt tomorrow at 12:15

## 2018-11-09 NOTE — Telephone Encounter (Signed)
Can you please call pt. We can see him tomorrow afternoon if he would like to come in.

## 2018-11-09 NOTE — Telephone Encounter (Signed)
Triage call (voice mail): had 3rd toe amputated on 12/06/2018. He can see that there has been some bleeding, but there is nothing "oozing out - the bandage is holding."  He just wants to know if this is normal or if he should worry about a suture having broken. [phone].

## 2018-11-09 NOTE — Telephone Encounter (Signed)
Transition Care Management Follow-up Telephone Call  Date of discharge and from where: 11/07/18 [facility]  How have you been since you were released from the hospital? Pt states he is doing okay. He is not taking oxycodone for pain. Pt's current pain level 2 out of 10. Nerve block has worn off mostly but still has some numbness on right calf and can now move foot with full range motion at ankle.   Any questions or concerns? Yes- hospital provider advised referral to podiatry needed from PCP   Items Reviewed:   Did the pt receive and understand the discharge instructions provided? Yes   Medications obtained and verified? Yes   Any new allergies since your discharge? No   Dietary orders reviewed? Yes  Do you have support at home? Yes   Functional Questionnaire: (I = Independent and D = Dependent) ADLs: I  Bathing/Dressing- I  Meal Prep- I  Eating- I  Maintaining continence- I  Transferring/Ambulation- I  Managing Meds- I  Follow up appointments reviewed:   PCP Hospital f/u appt confirmed? Yes  Scheduled to see .  Specialist Hospital f/u appt confirmed? Yes  Scheduled to see Dr. Lajoyce Corners on 11/10/18.   Are transportation arrangements needed? No   If their condition worsens, is the pt aware to call PCP or go to the Emergency Dept.? Yes  Was the patient provided with contact information for the PCP's office or ED? Yes  Was to pt encouraged to call back with questions or concerns? Yes

## 2018-11-09 NOTE — Telephone Encounter (Signed)
Anthony Park can you please open me a slot on Shawn's Schedule for 12:15

## 2018-11-10 ENCOUNTER — Ambulatory Visit (INDEPENDENT_AMBULATORY_CARE_PROVIDER_SITE_OTHER): Payer: BLUE CROSS/BLUE SHIELD | Admitting: Physician Assistant

## 2018-11-10 ENCOUNTER — Encounter (INDEPENDENT_AMBULATORY_CARE_PROVIDER_SITE_OTHER): Payer: Self-pay | Admitting: Physician Assistant

## 2018-11-10 VITALS — Ht 72.0 in | Wt 227.1 lb

## 2018-11-10 DIAGNOSIS — Z89421 Acquired absence of other right toe(s): Secondary | ICD-10-CM

## 2018-11-10 DIAGNOSIS — E1142 Type 2 diabetes mellitus with diabetic polyneuropathy: Secondary | ICD-10-CM

## 2018-11-10 MED ORDER — DOXYCYCLINE HYCLATE 100 MG PO CAPS: 100.0000 mg | ORAL_CAPSULE | Freq: Two times a day (BID) | ORAL | 1 refills | Status: DC

## 2018-11-10 NOTE — Progress Notes (Signed)
Office Visit Note   Patient: Anthony Park           Date of Birth: [birth year]           MRN: [id number] Visit Date: 11/10/2018              Requested by: Doren Custard, FNP [address] [place], [place] [address] PCP: Doren Custard, FNP  Chief Complaint  Patient presents with  . Right Foot - Pain, Routine Post Op      HPI: The patient is a 58 year old gentleman who is seen for postoperative follow-up following a right third toe amputation on 11/07/2018 for osteomyelitis.  He comes in early today due to concerns of bleeding through the dressing.  He has been minimally weightbearing with a walker.  He does think that he may have bumped the foot against something.  He had a postoperative block and he reports that this lasted a couple of days and he is just now getting some sensation completely back.  Assessment & Plan: Visit Diagnoses:  1. Status post amputation of toe of right foot (HCC)   2. Type 2 diabetes mellitus with diabetic polyneuropathy, without long-term current use of insulin (HCC)   3. Diabetic polyneuropathy associated with type 2 diabetes mellitus (HCC)     Plan: Operative dressings were removed and the patient has no active bleeding from the incisional area.  He does have some peri-incisional erythema and some mild widening of the incision distally.  Were going to start some doxycycline 100 mg p.o. twice daily.  He was instructed to maintain strict nonweightbearing and continue to walk with a walker and elevate as much as possible.  Also recommended daily gauze to the incisional area and wrapped with Ace wrapping.  He will follow-up next week as previously scheduled.  Follow-Up Instructions: Return in about 6 days (around 11/16/2018).   Ortho Exam  Patient is alert, oriented, no adenopathy, well-dressed, normal affect, normal respiratory effort. The right foot third toe amputation site has no active bleeding.  There is some mild widening of the distal  incision with some exposure of subcutaneous tissue.  There is some peri-incisional erythema and edema.  He has palpable pedal pulses.  Imaging: No results found.   Labs: Lab Results  Component Value Date   HGBA1C 6.7 (H) 11/05/2018     Lab Results  Component Value Date   ALBUMIN 3.5 11/06/2018    Body mass index is 30.8 kg/m.  Orders:  No orders of the defined types were placed in this encounter.  Meds ordered this encounter  Medications  . doxycycline (VIBRAMYCIN) 100 MG capsule    Sig: Take 1 capsule (100 mg total) by mouth 2 (two) times daily.    Dispense:  14 capsule    Refill:  1     Procedures: No procedures performed  Clinical Data: No additional findings.  ROS:  All other systems negative, except as noted in the HPI. Review of Systems  Objective: Vital Signs: Ht 6' (1.829 m)   Wt 227 lb 1.1 oz (103 kg)   BMI 30.80 kg/m   Specialty Comments:  No specialty comments available.  PMFS History: Patient Active Problem List   Diagnosis Date Noted  . Osteomyelitis of third toe of right foot (HCC)   . Cellulitis of third toe of right foot   . Diabetic toe ulcer (HCC) 11/05/2018   Past Medical History:  Diagnosis Date  . Diabetes mellitus without complication (HCC)   .  Hypertension     History reviewed. No pertinent family history.  Past Surgical History:  Procedure Laterality Date  . AMPUTATION Right 11/07/2018   Procedure: RIGHT 3RD TOE AMPUTATION;  Surgeon: Nadara Mustard, MD;  Location: [facility] OR;  Service: Orthopedics;  Laterality: Right;  . TOE AMPUTATION Left 2015   Social History   Occupational History  . Not on file  Tobacco Use  . Smoking status: Not on file  Substance and Sexual Activity  . Alcohol use: Not on file  . Drug use: Not on file  . Sexual activity: Not on file

## 2018-11-16 ENCOUNTER — Encounter (INDEPENDENT_AMBULATORY_CARE_PROVIDER_SITE_OTHER): Payer: Self-pay | Admitting: Orthopedic Surgery

## 2018-11-16 ENCOUNTER — Ambulatory Visit (INDEPENDENT_AMBULATORY_CARE_PROVIDER_SITE_OTHER): Payer: BLUE CROSS/BLUE SHIELD | Admitting: Orthopedic Surgery

## 2018-11-16 VITALS — Ht 72.0 in | Wt 227.0 lb

## 2018-11-16 DIAGNOSIS — Z89421 Acquired absence of other right toe(s): Secondary | ICD-10-CM

## 2018-11-16 NOTE — Progress Notes (Signed)
   Office Visit Note   Patient: Anthony Park           Date of Birth: [birth year]           MRN: [id number] Visit Date: 11/16/2018              Requested by: Alba Cory, MD [address] [place], [place] [address] PCP: Doren Custard, FNP  Chief Complaint  Patient presents with  . Right Foot - Routine Post Op    11/07/2018 right 3rd toe amputation       HPI: [name] who presents 2 weeks status post right foot third toe amputation he states he has had some bleeding he will finish a course of doxycycline he denies any pain denies any swelling.  Assessment & Plan: Visit Diagnoses:  1. Status post amputation of toe of right foot (HCC)     Plan: Patient will continue with protected weightbearing with his walker and postoperative shoe Dial soap cleansing and wear a Band-Aid follow-up in 1 week to harvest the sutures.  Recommended a stiff soled hiking sneaker to unload pressure from the forefoot.  Follow-Up Instructions: Return in about 1 week (around 11/23/2018).   Ortho Exam  Patient is alert, oriented, no adenopathy, well-dressed, normal affect, normal respiratory effort. Examination the incision edges are well approximated there is a small amount of hyper granulation tissue this was touched with silver nitrate.  There is no cellulitis no tenderness to palpation no drainage no signs of infection.  Imaging: No results found. No images are attached to the encounter.  Labs: Lab Results  Component Value Date   HGBA1C 6.7 (H) 11/05/2018     Lab Results  Component Value Date   ALBUMIN 3.5 11/06/2018    Body mass index is 30.79 kg/m.  Orders:  No orders of the defined types were placed in this encounter.  No orders of the defined types were placed in this encounter.    Procedures: No procedures performed  Clinical Data: No additional findings.  ROS:  All other systems negative, except as noted in the HPI. Review of  Systems  Objective: Vital Signs: Ht 6' (1.829 m)   Wt 227 lb (103 kg)   BMI 30.79 kg/m   Specialty Comments:  No specialty comments available.  PMFS History: Patient Active Problem List   Diagnosis Date Noted  . Osteomyelitis of third toe of right foot (HCC)   . Cellulitis of third toe of right foot   . Diabetic toe ulcer (HCC) 11/05/2018   Past Medical History:  Diagnosis Date  . Diabetes mellitus without complication (HCC)   . Hypertension     History reviewed. No pertinent family history.  Past Surgical History:  Procedure Laterality Date  . AMPUTATION Right 11/07/2018   Procedure: RIGHT 3RD TOE AMPUTATION;  Surgeon: Nadara Mustard, MD;  Location: [facility] OR;  Service: Orthopedics;  Laterality: Right;  . TOE AMPUTATION Left 2015   Social History   Occupational History  . Not on file  Tobacco Use  . Smoking status: Unknown If Ever Smoked  . Smokeless tobacco: Never Used  Substance and Sexual Activity  . Alcohol use: Not on file  . Drug use: Not on file  . Sexual activity: Not on file

## 2018-11-22 ENCOUNTER — Ambulatory Visit (INDEPENDENT_AMBULATORY_CARE_PROVIDER_SITE_OTHER): Payer: BLUE CROSS/BLUE SHIELD | Admitting: Family Medicine

## 2018-11-22 ENCOUNTER — Encounter: Payer: Self-pay | Admitting: Family Medicine

## 2018-11-22 ENCOUNTER — Other Ambulatory Visit: Payer: Self-pay

## 2018-11-22 VITALS — BP 168/110 | HR 90 | Temp 98.2°F | Resp 14 | Ht 72.0 in | Wt 231.0 lb

## 2018-11-22 DIAGNOSIS — L97509 Non-pressure chronic ulcer of other part of unspecified foot with unspecified severity: Secondary | ICD-10-CM

## 2018-11-22 DIAGNOSIS — I724 Aneurysm of artery of lower extremity: Secondary | ICD-10-CM | POA: Insufficient documentation

## 2018-11-22 DIAGNOSIS — S98131A Complete traumatic amputation of one right lesser toe, initial encounter: Secondary | ICD-10-CM

## 2018-11-22 DIAGNOSIS — M869 Osteomyelitis, unspecified: Secondary | ICD-10-CM

## 2018-11-22 DIAGNOSIS — L03031 Cellulitis of right toe: Secondary | ICD-10-CM | POA: Diagnosis not present

## 2018-11-22 DIAGNOSIS — Z716 Tobacco abuse counseling: Secondary | ICD-10-CM

## 2018-11-22 DIAGNOSIS — E11621 Type 2 diabetes mellitus with foot ulcer: Secondary | ICD-10-CM | POA: Diagnosis not present

## 2018-11-22 DIAGNOSIS — I10 Essential (primary) hypertension: Secondary | ICD-10-CM | POA: Diagnosis not present

## 2018-11-22 DIAGNOSIS — E782 Mixed hyperlipidemia: Secondary | ICD-10-CM | POA: Diagnosis not present

## 2018-11-22 DIAGNOSIS — Z1211 Encounter for screening for malignant neoplasm of colon: Secondary | ICD-10-CM

## 2018-11-22 DIAGNOSIS — Z95820 Peripheral vascular angioplasty status with implants and grafts: Secondary | ICD-10-CM

## 2018-11-22 DIAGNOSIS — L97514 Non-pressure chronic ulcer of other part of right foot with necrosis of bone: Secondary | ICD-10-CM

## 2018-11-22 MED ORDER — AMLODIPINE BESYLATE 5 MG PO TABS: 5.0000 mg | ORAL_TABLET | Freq: Every day | ORAL | 3 refills | Status: DC

## 2018-11-22 MED ORDER — LISINOPRIL 20 MG PO TABS: 20.0000 mg | ORAL_TABLET | Freq: Every day | ORAL | 1 refills | Status: DC

## 2018-11-22 MED ORDER — ROSUVASTATIN CALCIUM 20 MG PO TABS: 20.0000 mg | ORAL_TABLET | Freq: Every day | ORAL | 1 refills | Status: DC

## 2018-11-22 NOTE — Assessment & Plan Note (Signed)
Amputation completed by Dr. Lajoyce Corners.  Referral to podiatry placed today. Has suture removal scheduled for tomorrow. Completed doxycycline.

## 2018-11-22 NOTE — Assessment & Plan Note (Signed)
Discussed increased risk of co-morbid conditions, MI/CVA. Discussed importance of 150 minutes of physical activity weekly, eat two servings of fish weekly, eat one serving of tree nuts ( cashews, pistachios, pecans, almonds.Marland Kitchen) every other day, eat 6 servings of fruit/vegetables daily and drink plenty of water and avoid sweet beverages.

## 2018-11-22 NOTE — Patient Instructions (Signed)
Fat and Cholesterol Restricted Eating Plan Getting too much fat and cholesterol in your diet may cause health problems. Choosing the right foods helps keep your fat and cholesterol at normal levels. This can keep you from getting certain diseases. Your doctor may recommend an eating plan that includes:  Total fat: ______% or less of total calories a day.  Saturated fat: ______% or less of total calories a day.  Cholesterol: less than _________mg a day.  Fiber: ______g a day. What are tips for following this plan? Meal planning  At meals, divide your plate into four equal parts: ? Fill one-half of your plate with vegetables and green salads. ? Fill one-fourth of your plate with whole grains. ? Fill one-fourth of your plate with low-fat (lean) protein foods.  Eat fish that is high in omega-3 fats at least two times a week. This includes mackerel, tuna, sardines, and salmon.  Eat foods that are high in fiber, such as whole grains, beans, apples, broccoli, carrots, peas, and barley. General tips   Work with your doctor to lose weight if you need to.  Avoid: ? Foods with added sugar. ? Fried foods. ? Foods with partially hydrogenated oils.  Limit alcohol intake to no more than 1 drink a day for nonpregnant women and 2 drinks a day for men. One drink equals 12 oz of beer, 5 oz of wine, or 1 oz of hard liquor. Reading food labels  Check food labels for: ? Trans fats. ? Partially hydrogenated oils. ? Saturated fat (g) in each serving. ? Cholesterol (mg) in each serving. ? Fiber (g) in each serving.  Choose foods with healthy fats, such as: ? Monounsaturated fats. ? Polyunsaturated fats. ? Omega-3 fats.  Choose grain products that have whole grains. Look for the word "whole" as the first word in the ingredient list. Cooking  Cook foods using low-fat methods. These include baking, boiling, grilling, and broiling.  Eat more home-cooked foods. Eat at restaurants and buffets  less often.  Avoid cooking using saturated fats, such as butter, cream, palm oil, palm kernel oil, and coconut oil. Recommended foods  Fruits  All fresh, canned (in natural juice), or frozen fruits. Vegetables  Fresh or frozen vegetables (raw, steamed, roasted, or grilled). Green salads. Grains  Whole grains, such as whole wheat or whole grain breads, crackers, cereals, and pasta. Unsweetened oatmeal, bulgur, barley, quinoa, or brown rice. Corn or whole wheat flour tortillas. Meats and other protein foods  Ground beef (85% or leaner), grass-fed beef, or beef trimmed of fat. Skinless chicken or turkey. Ground chicken or turkey. Pork trimmed of fat. All fish and seafood. Egg whites. Dried beans, peas, or lentils. Unsalted nuts or seeds. Unsalted canned beans. Nut butters without added sugar or oil. Dairy  Low-fat or nonfat dairy products, such as skim or 1% milk, 2% or reduced-fat cheeses, low-fat and fat-free ricotta or cottage cheese, or plain low-fat and nonfat yogurt. Fats and oils  Tub margarine without trans fats. Light or reduced-fat mayonnaise and salad dressings. Avocado. Olive, canola, sesame, or safflower oils. The items listed above may not be a complete list of foods and beverages you can eat. Contact a dietitian for more information. Foods to avoid Fruits  Canned fruit in heavy syrup. Fruit in cream or butter sauce. Fried fruit. Vegetables  Vegetables cooked in cheese, cream, or butter sauce. Fried vegetables. Grains  White bread. White pasta. White rice. Cornbread. Bagels, pastries, and croissants. Crackers and snack foods that contain trans fat   and hydrogenated oils. Meats and other protein foods  Fatty cuts of meat. Ribs, chicken wings, bacon, sausage, bologna, salami, chitterlings, fatback, hot dogs, bratwurst, and packaged lunch meats. Liver and organ meats. Whole eggs and egg yolks. Chicken and turkey with skin. Fried meat. Dairy  Whole or 2% milk, cream,  half-and-half, and cream cheese. Whole milk cheeses. Whole-fat or sweetened yogurt. Full-fat cheeses. Nondairy creamers and whipped toppings. Processed cheese, cheese spreads, and cheese curds. Beverages  Alcohol. Sugar-sweetened drinks such as sodas, lemonade, and fruit drinks. Fats and oils  Butter, stick margarine, lard, shortening, ghee, or bacon fat. Coconut, palm kernel, and palm oils. Sweets and desserts  Corn syrup, sugars, honey, and molasses. Candy. Jam and jelly. Syrup. Sweetened cereals. Cookies, pies, cakes, donuts, muffins, and ice cream. The items listed above may not be a complete list of foods and beverages you should avoid. Contact a dietitian for more information. Summary  Choosing the right foods helps keep your fat and cholesterol at normal levels. This can keep you from getting certain diseases.  At meals, fill one-half of your plate with vegetables and green salads.  Eat high-fiber foods, like whole grains, beans, apples, carrots, peas, and barley.  Limit added sugar, saturated fats, alcohol, and fried foods. This information is not intended to replace advice given to you by your health care provider. Make sure you discuss any questions you have with your health care provider. Document Released: 02/29/2012 Document Revised: 05/03/2018 Document Reviewed: 05/17/2017 Elsevier Interactive Patient Education  2019 Elsevier Inc.   Diabetes Mellitus and Nutrition, Adult When you have diabetes (diabetes mellitus), it is very important to have healthy eating habits because your blood sugar (glucose) levels are greatly affected by what you eat and drink. Eating healthy foods in the appropriate amounts, at about the same times every day, can help you:  Control your blood glucose.  Lower your risk of heart disease.  Improve your blood pressure.  Reach or maintain a healthy weight. Every person with diabetes is different, and each person has different needs for a meal  plan. Your health care provider may recommend that you work with a diet and nutrition specialist (dietitian) to make a meal plan that is best for you. Your meal plan may vary depending on factors such as:  The calories you need.  The medicines you take.  Your weight.  Your blood glucose, blood pressure, and cholesterol levels.  Your activity level.  Other health conditions you have, such as heart or kidney disease. How do carbohydrates affect me? Carbohydrates, also called carbs, affect your blood glucose level more than any other type of food. Eating carbs naturally raises the amount of glucose in your blood. Carb counting is a method for keeping track of how many carbs you eat. Counting carbs is important to keep your blood glucose at a healthy level, especially if you use insulin or take certain oral diabetes medicines. It is important to know how many carbs you can safely have in each meal. This is different for every person. Your dietitian can help you calculate how many carbs you should have at each meal and for each snack. Foods that contain carbs include:  Bread, cereal, rice, pasta, and crackers.  Potatoes and corn.  Peas, beans, and lentils.  Milk and yogurt.  Fruit and juice.  Desserts, such as cakes, cookies, ice cream, and candy. How does alcohol affect me? Alcohol can cause a sudden decrease in blood glucose (hypoglycemia), especially if you use insulin   or take certain oral diabetes medicines. Hypoglycemia can be a life-threatening condition. Symptoms of hypoglycemia (sleepiness, dizziness, and confusion) are similar to symptoms of having too much alcohol. If your health care provider says that alcohol is safe for you, follow these guidelines:  Limit alcohol intake to no more than 1 drink per day for nonpregnant women and 2 drinks per day for men. One drink equals 12 oz of beer, 5 oz of wine, or 1 oz of hard liquor.  Do not drink on an empty stomach.  Keep yourself  hydrated with water, diet soda, or unsweetened iced tea.  Keep in mind that regular soda, juice, and other mixers may contain a lot of sugar and must be counted as carbs. What are tips for following this plan?  Reading food labels  Start by checking the serving size on the "Nutrition Facts" label of packaged foods and drinks. The amount of calories, carbs, fats, and other nutrients listed on the label is based on one serving of the item. Many items contain more than one serving per package.  Check the total grams (g) of carbs in one serving. You can calculate the number of servings of carbs in one serving by dividing the total carbs by 15. For example, if a food has 30 g of total carbs, it would be equal to 2 servings of carbs.  Check the number of grams (g) of saturated and trans fats in one serving. Choose foods that have low or no amount of these fats.  Check the number of milligrams (mg) of salt (sodium) in one serving. Most people should limit total sodium intake to less than 2,300 mg per day.  Always check the nutrition information of foods labeled as "low-fat" or "nonfat". These foods may be higher in added sugar or refined carbs and should be avoided.  Talk to your dietitian to identify your daily goals for nutrients listed on the label. Shopping  Avoid buying canned, premade, or processed foods. These foods tend to be high in fat, sodium, and added sugar.  Shop around the outside edge of the grocery store. This includes fresh fruits and vegetables, bulk grains, fresh meats, and fresh dairy. Cooking  Use low-heat cooking methods, such as baking, instead of high-heat cooking methods like deep frying.  Cook using healthy oils, such as olive, canola, or sunflower oil.  Avoid cooking with butter, cream, or high-fat meats. Meal planning  Eat meals and snacks regularly, preferably at the same times every day. Avoid going long periods of time without eating.  Eat foods high in  fiber, such as fresh fruits, vegetables, beans, and whole grains. Talk to your dietitian about how many servings of carbs you can eat at each meal.  Eat 4-6 ounces (oz) of lean protein each day, such as lean meat, chicken, fish, eggs, or tofu. One oz of lean protein is equal to: ? 1 oz of meat, chicken, or fish. ? 1 egg. ?  cup of tofu.  Eat some foods each day that contain healthy fats, such as avocado, nuts, seeds, and fish. Lifestyle  Check your blood glucose regularly.  Exercise regularly as told by your health care provider. This may include: ? 150 minutes of moderate-intensity or vigorous-intensity exercise each week. This could be brisk walking, biking, or water aerobics. ? Stretching and doing strength exercises, such as yoga or weightlifting, at least 2 times a week.  Take medicines as told by your health care provider.  Do not use any products   that contain nicotine or tobacco, such as cigarettes and e-cigarettes. If you need help quitting, ask your health care provider.  Work with a counselor or diabetes educator to identify strategies to manage stress and any emotional and social challenges. Questions to ask a health care provider  Do I need to meet with a diabetes educator?  Do I need to meet with a dietitian?  What number can I call if I have questions?  When are the best times to check my blood glucose? Where to find more information:  American Diabetes Association: diabetes.org  Academy of Nutrition and Dietetics: www.eatright.org  National Institute of Diabetes and Digestive and Kidney Diseases (NIH): www.niddk.nih.gov Summary  A healthy meal plan will help you control your blood glucose and maintain a healthy lifestyle.  Working with a diet and nutrition specialist (dietitian) can help you make a meal plan that is best for you.  Keep in mind that carbohydrates (carbs) and alcohol have immediate effects on your blood glucose levels. It is important to count  carbs and to use alcohol carefully. This information is not intended to replace advice given to you by your health care provider. Make sure you discuss any questions you have with your health care provider. Document Released: 05/27/2005 Document Revised: 03/30/2017 Document Reviewed: 10/04/2016 Elsevier Interactive Patient Education  2019 Elsevier Inc.   

## 2018-11-22 NOTE — Assessment & Plan Note (Signed)
Continue Crestor 20 mg

## 2018-11-22 NOTE — Assessment & Plan Note (Signed)
Site appears to be healing appropriately, no drainage, sutures are intact, seeing Dr. Lajoyce Corners tomorrow for suture removal.

## 2018-11-22 NOTE — Assessment & Plan Note (Signed)
Add amlodipine 5mg , he is asymptomatic today.  Continue 20mg  lisinopril.  We will recheck in 2 weeks.

## 2018-11-22 NOTE — Assessment & Plan Note (Signed)
Toe has been amputated.

## 2018-11-22 NOTE — Assessment & Plan Note (Signed)
Amputation completed by Dr. Lajoyce Corners.  Referral to podiatry placed today. Has suture removal scheduled for tomorrow.

## 2018-11-22 NOTE — Assessment & Plan Note (Signed)
A1C was controlled in hospital at 6.7%, does not want to take Metformin, will proceed with diet control only.

## 2018-11-22 NOTE — Assessment & Plan Note (Signed)
Stable with stent in place

## 2018-11-22 NOTE — Progress Notes (Signed)
New Patient Office Visit  Subjective:  Patient ID: Anthony Park, male    DOB: 07-22-1961  Age: 58 y.o. MRN: [id number]  CC:  Chief Complaint  Patient presents with  . Establish Care  . Knee Pain    left onset 2years and worsening  . Numbness    bilateral feet     HPI Anthony Park presents for establish care after hospital admission and discharge for cellulitis of RIGHT foot cellulitis with RIGHT third toe osteomyelitis with amputation.    RIGHT foot cellulitis/RIGHT third toe osteomyelitis with amputation:  Following up for suture removal tomorrow with Dr. Lajoyce Corners.  The area has been doing well without erythema, swelling, or exudate.  He endorses some pain at the site of amputation, but did not have to take pain medication for this yet.  He does note LEFT knee pain from abnormal gait lately.  Advised to monitor this while he heals from amputation and will consider PT and/or Ortho referral if ongoing.  Completed doxycycline. CBC was normal upon admission and discharge, CMP was normal except for elevated glucose.  HLD: Started crestor 20 and ASA in hospital.  Denies chest pain, shortness of breath, or myalgias. Last lipids in February 2020, no repeat today.  HTN:  Start lisinopril  in hospital.  He notes BP yesterday was in the 120's.  Did not take his medications yesterday, and took his medicine about an hour ago today.  He also did not sleep well last night.  His blood pressure is quite elevated today, so we will add amlodipine  today to help bring this down.  Diabetes mellitus type 2 Checking sugars?  yes How often? daily Range (low to high) over last two weeks: 100-120 Does patient feel additional teaching/training would be helpful?  no  Have they attended Diabetes education classes? no  Trying to limit white bread, white rice, white potatoes, sweets?  yes Trying to limit sweetened drinks like iced tea, soft drinks, sports drinks, fruit juices?  yes Checking feet every  day/night?  yes Last eye exam:  Did have an eye examination this past year, is due for this soon. Denies: Polyuria, polydipsia, polyphagia, vision changes, or neuropathy. Most recent A1C:  Lab Results  Component Value Date   HGBA1C 6.7 (H) 11/05/2018    We will recheck today. Last CMP Results : is not due for repeat today    Component Value Date/Time   NA 136 11/06/2018 0350   K 3.6 11/06/2018 0350   CL 103 11/06/2018 0350   CO2 23 11/06/2018 0350   GLUCOSE 107 (H) 11/06/2018 0350   BUN 12 11/06/2018 0350   BUN 14 02/13/2014 1044   CREATININE 0.83 11/06/2018 0350   CREATININE 0.75 02/13/2014 1044   CALCIUM 8.9 11/06/2018 0350   PROT 6.8 11/06/2018 0350   ALBUMIN 3.5 11/06/2018 0350   AST 25 11/06/2018 0350   ALT 19 11/06/2018 0350   ALKPHOS 53 11/06/2018 0350   BILITOT 1.0 11/06/2018 0350   GFRNONAA >60 11/06/2018 0350   GFRNONAA >60 02/13/2014 1044   GFRAA >60 11/06/2018 0350   GFRAA >60 02/13/2014 1044   Urine Micro UTD? Yes Current Medication Management: Diabetic Medications: Is not on any medication at this time, diet controlled, was not put on anything at the hospital, was on Metformin in the past, but his BG's were going too low due to healthy diet and came off of it.  Does not want to return at this point. ACEI/ARB: Yes Statin:  Yes Aspirin therapy: Yes  Obesity: Body mass index is 31.33 kg/m. Weight Management History: Diet: Following diabetic diet, trying to limit fatty foods and red meat.  Exercise: moderately active at his job, not exercising otherwise.  Co-Morbid Conditions: diabetes mellitus, dyslipidemias and hypertension; 2 or more of these conditions combined with BMI >30 is considered morbid obesity; is this diagnosis appropriate and/or added to patient's problem list? Yes  S/p popliteal artery aneurysm stent placement: Had catheter placed in the LEFT posterior knee in 2015 by Dr. Wyn Quaker- reviewed records.  No concerns today.  Smoking 1ppd - counseling   is provided.  He is not ready to quit at this time.  Past Medical History:  Diagnosis Date  . Diabetes mellitus without complication (HCC)   . Diabetic toe ulcer (HCC) 11/05/2018  . Hyperlipidemia   . Hypertension     Past Surgical History:  Procedure Laterality Date  . AMPUTATION Right 11/07/2018   Procedure: RIGHT 3RD TOE AMPUTATION;  Surgeon: Nadara Mustard, MD;  Location: [facility];  Service: Orthopedics;  Laterality: Right;  . AMPUTATION TOE Left    big toe and one next to it  . CATARACT EXTRACTION Bilateral   . TOE AMPUTATION Left 2015    Family History  Problem Relation Age of Onset  . COPD Mother   . Cataracts Mother   . Brain cancer Father   . Hypertension Brother     Social History   Socioeconomic History  . Marital status: Married    Spouse name: [name]  . Number of children: 3  . Years of education: 48  . Highest education level: Not on file  Occupational History  . Not on file  Social Needs  . Financial resource strain: Not hard at all  . Food insecurity:    Worry: Never true    Inability: Never true  . Transportation needs:    Medical: No    Non-medical: No  Tobacco Use  . Smoking status: Current Every Day Smoker    Packs/day: 1.00    Types: Cigarettes  . Smokeless tobacco: Never Used  Substance and Sexual Activity  . Alcohol use: Yes    Alcohol/week: 7.0 standard drinks    Types: 7 Shots of liquor per week    Comment: 7 per week  . Drug use: Not Currently  . Sexual activity: Yes  Lifestyle  . Physical activity:    Days per week: 0 days    Minutes per session: 0 min  . Stress: Not at all  Relationships  . Social connections:    Talks on phone: Twice a week    Gets together: Twice a week    Attends religious service: Never    Active member of club or organization: No    Attends meetings of clubs or organizations: Never    Relationship status: Married  . Intimate partner violence:    Fear of current or ex partner: No    Emotionally  abused: No    Physically abused: No    Forced sexual activity: No  Other Topics Concern  . Not on file  Social History Narrative  . Not on file    ROS Review of Systems  Constitutional: Negative for fever or weight change.  Respiratory: Negative for cough and shortness of breath.   Cardiovascular: Negative for chest pain or palpitations.  Gastrointestinal: Negative for abdominal pain, no bowel changes.  Musculoskeletal: Antalgic gait secondary to recent toe amputation.  Skin: Negative for rash.  Neurological: Negative  for dizziness or headache.  No other specific complaints in a complete review of systems (except as listed in HPI above).  Objective:   Today's Vitals: BP (!) 168/110 (BP Location: Right Arm, Patient Position: Sitting, Cuff Size: Large)   Pulse 90   Temp 98.2 F (36.8 C) (Oral)   Resp 14   Ht 6' (1.829 m)   Wt 231 lb (104.8 kg)   SpO2 99%   BMI 31.33 kg/m   Physical Exam  Constitutional: Patient appears well-developed and well-nourished. No distress.  HENT: Head: Normocephalic and atraumatic. Eyes: Conjunctivae and EOM are normal. No scleral icterus. Neck: Normal range of motion. Neck supple. No JVD present. No thyromegaly present.  Cardiovascular: Normal rate, regular rhythm and normal heart sounds.  No murmur heard. No BLE edema. Pulmonary/Chest: Effort normal and breath sounds normal. No respiratory distress. Abdominal: Soft. Bowel sounds are normal, no distension. There is no tenderness. No masses. Musculoskeletal: Normal range of motion, no joint effusions. No gross deformities.  Neurological: Pt is alert and oriented to person, place, and time. No cranial nerve deficit. Coordination, balance, strength, speech are normal. Antalgic gait secondary to recent amputation. Skin: Skin is warm and dry. No rash noted. No erythema. RIGHT third toe has been amputated, the sutures are intact with edged well approximated and no exudate present.  See detailed foot  examination. Psychiatric: Patient has a normal mood and affect. behavior is normal. Judgment and thought content normal.  Assessment & Plan:   Problem List Items Addressed This Visit      Cardiovascular and Mediastinum   Essential hypertension    Add amlodipine , he is asymptomatic today.  Continue  lisinopril.  We will recheck in 2 weeks.      Relevant Medications   lisinopril (PRINIVIL,ZESTRIL) 20 MG tablet   rosuvastatin (CRESTOR) 20 MG tablet   amLODipine (NORVASC) 5 MG tablet   Aneurysm of left popliteal artery (HCC)    Stable with stent in place      Relevant Medications   lisinopril (PRINIVIL,ZESTRIL) 20 MG tablet   rosuvastatin (CRESTOR) 20 MG tablet   amLODipine (NORVASC) 5 MG tablet     Endocrine   Type 2 diabetes mellitus with foot ulcer, without long-term current use of insulin (HCC)    A1C was controlled in hospital at 6.7%, does not want to take Metformin, will proceed with diet control only.      Relevant Medications   lisinopril (PRINIVIL,ZESTRIL) 20 MG tablet   rosuvastatin (CRESTOR) 20 MG tablet   Other Relevant Orders   Urine Microalbumin w/creat. ratio   RESOLVED: Diabetic toe ulcer (HCC)    Toe has been amputated.      Relevant Medications   lisinopril (PRINIVIL,ZESTRIL) 20 MG tablet   rosuvastatin (CRESTOR) 20 MG tablet   Other Relevant Orders   Ambulatory referral to Podiatry     Musculoskeletal and Integument   Osteomyelitis of third toe of right foot (HCC) - Primary    Amputation completed by Dr. Lajoyce Corners.  Referral to podiatry placed today. Has suture removal scheduled for tomorrow.      Relevant Orders   Ambulatory referral to Podiatry     Other   Cellulitis of third toe of right foot    Amputation completed by Dr. Lajoyce Corners.  Referral to podiatry placed today. Has suture removal scheduled for tomorrow. Completed doxycycline.      Relevant Orders   Ambulatory referral to Podiatry   Amputated toe, right [facility])    Site appears  to be  healing appropriately, no drainage, sutures are intact, seeing Dr. Lajoyce Corners tomorrow for suture removal.      Relevant Orders   Ambulatory referral to Podiatry   Mixed hyperlipidemia    Continue Crestor 20mg       Relevant Medications   lisinopril (PRINIVIL,ZESTRIL) 20 MG tablet   rosuvastatin (CRESTOR) 20 MG tablet   amLODipine (NORVASC) 5 MG tablet   Morbid obesity (HCC)    Discussed increased risk of co-morbid conditions, MI/CVA. Discussed importance of 150 minutes of physical activity weekly, eat two servings of fish weekly, eat one serving of tree nuts ( cashews, pistachios, pecans, almonds.Marland Kitchen) every other day, eat 6 servings of fruit/vegetables daily and drink plenty of water and avoid sweet beverages.       Status post peripheral artery angioplasty with insertion of stent   Encounter for smoking cessation counseling    Other Visit Diagnoses    Screen for colon cancer       Relevant Orders   Cologuard      Outpatient Encounter Medications as of 11/22/2018  Medication Sig  . acetaminophen (TYLENOL) 325 MG tablet Take 2 tablets (650 mg total) by mouth every 6 (six) hours as needed for mild pain (or Fever >/= 101).  Marland Kitchen aspirin EC 81 MG tablet Take 1 tablet (81 mg total) by mouth daily.  Marland Kitchen lisinopril (PRINIVIL,ZESTRIL) 20 MG tablet Take 1 tablet (20 mg total) by mouth daily.  . Multiple Vitamins-Minerals (MULTIVITAMIN WITH MINERALS) tablet Take 1 tablet by mouth daily.  . rosuvastatin (CRESTOR) 20 MG tablet Take 1 tablet (20 mg total) by mouth daily at 6 PM.  . [DISCONTINUED] lisinopril (PRINIVIL,ZESTRIL) 20 MG tablet Take 1 tablet (20 mg total) by mouth daily.  . [DISCONTINUED] rosuvastatin (CRESTOR) 20 MG tablet Take 1 tablet (20 mg total) by mouth daily at 6 PM.  . amLODipine (NORVASC) 5 MG tablet Take 1 tablet (5 mg total) by mouth daily.  . [DISCONTINUED] doxycycline (VIBRAMYCIN) 100 MG capsule Take 1 capsule (100 mg total) by mouth 2 (two) times daily. (Patient not taking:  Reported on 11/22/2018)  . [DISCONTINUED] oxyCODONE (OXY IR/ROXICODONE) 5 MG immediate release tablet Take 1-2 tablets (5-10 mg total) by mouth every 6 (six) hours as needed for moderate pain (pain score 4-6). (Patient not taking: Reported on 11/22/2018)   No facility-administered encounter medications on file as of 11/22/2018.     Follow-up: Return for 1 week nurse visit BP check/ 3 month CPE w Follow Up 40 min appt, Annual CPE.   Doren Custard, FNP

## 2018-11-23 ENCOUNTER — Encounter (INDEPENDENT_AMBULATORY_CARE_PROVIDER_SITE_OTHER): Payer: Self-pay | Admitting: Orthopedic Surgery

## 2018-11-23 ENCOUNTER — Ambulatory Visit (INDEPENDENT_AMBULATORY_CARE_PROVIDER_SITE_OTHER): Payer: BLUE CROSS/BLUE SHIELD | Admitting: Physician Assistant

## 2018-11-23 VITALS — Ht 72.0 in | Wt 231.0 lb

## 2018-11-23 DIAGNOSIS — Z89421 Acquired absence of other right toe(s): Secondary | ICD-10-CM

## 2018-11-23 DIAGNOSIS — E1142 Type 2 diabetes mellitus with diabetic polyneuropathy: Secondary | ICD-10-CM

## 2018-11-23 LAB — MICROALBUMIN / CREATININE URINE RATIO
Creatinine, Urine: 196 mg/dL (ref 20–320)
Microalb Creat Ratio: 112 mcg/mg creat — ABNORMAL HIGH (ref <30)
Microalb, Ur: 22 mg/dL

## 2018-11-26 NOTE — Progress Notes (Signed)
Office Visit Note   Patient: Anthony Park           Date of Birth: [birth year]           MRN: [id number] Visit Date: 11/23/2018              Requested by: Anthony Custard, FNP [address] [place], [place] [address] PCP: Anthony Custard, FNP  Chief Complaint  Patient presents with  . Right Foot - Routine Post Op    Right foot 3rd toe amp 11/07/2018      HPI: The patient is a 58 year old gentleman who is seen for postoperative follow-up following a right third toe amputation on 11/07/2018 for osteomyelitis.  His sutures were removed today.  He reports no pain and minimal swelling over the area.  He does feel a pressure type feeling sometimes when walking but has been walking weightbearing as tolerated in his postoperative shoe.  Assessment & Plan: Visit Diagnoses:  1. Status post amputation of toe of right foot (HCC)   2. Type 2 diabetes mellitus with diabetic polyneuropathy, without long-term current use of insulin (HCC)     Plan: Counseled patient to continue offloading as much as possible.  We will also begin a silver compression sock which she can wear around the clock except for showering.  He should use Dial soap and water to the foot but should not soak or submerge the foot.  Recommended a stiff soled shoe for when he does begin wearing a regular shoe.  The patient will require at least another 4 weeks out of work as he stands for very long periods and does walk for work.  He will follow-up here in 3 weeks or sooner should he have difficulties in the interim.  Follow-Up Instructions: No follow-ups on file.   Ortho Exam  Patient is alert, oriented, no adenopathy, well-dressed, normal affect, normal respiratory effort. The third toe incision has slight widening.  Sutures are removed today.  He has minimal crusting over the area and some scant serous drainage.  He has palpable pedal pulses.  He does need to continue to offload the area is much as possible and were  going to utilize a silver compression sock compression sock to continue to aid healing.  Imaging: No results found.   Labs: Lab Results  Component Value Date   HGBA1C 6.7 (H) 11/05/2018     Lab Results  Component Value Date   ALBUMIN 3.5 11/06/2018    Body mass index is 31.33 kg/m.  Orders:  No orders of the defined types were placed in this encounter.  No orders of the defined types were placed in this encounter.    Procedures: No procedures performed  Clinical Data: No additional findings.  ROS:  All other systems negative, except as noted in the HPI. Review of Systems  Objective: Vital Signs: Ht 6' (1.829 m)   Wt 231 lb (104.8 kg)   BMI 31.33 kg/m   Specialty Comments:  No specialty comments available.  PMFS History: Patient Active Problem List   Diagnosis Date Noted  . Amputated toe, right (HCC) 11/22/2018  . Type 2 diabetes mellitus with foot ulcer, without long-term current use of insulin (HCC) 11/22/2018  . Essential hypertension 11/22/2018  . Mixed hyperlipidemia 11/22/2018  . Morbid obesity (HCC) 11/22/2018  . Aneurysm of left popliteal artery (HCC) 11/22/2018  . Status post peripheral artery angioplasty with insertion of stent 11/22/2018  . Encounter for smoking cessation counseling 11/22/2018  .  Osteomyelitis of third toe of right foot (HCC)   . Cellulitis of third toe of right foot    Past Medical History:  Diagnosis Date  . Diabetes mellitus without complication (HCC)   . Diabetic toe ulcer (HCC) 11/05/2018  . Hyperlipidemia   . Hypertension     Family History  Problem Relation Age of Onset  . COPD Mother   . Cataracts Mother   . Brain cancer Father   . Hypertension Brother     Past Surgical History:  Procedure Laterality Date  . AMPUTATION Right 11/07/2018   Procedure: RIGHT 3RD TOE AMPUTATION;  Surgeon: Nadara Mustard, MD;  Location: [facility] OR;  Service: Orthopedics;  Laterality: Right;  . AMPUTATION TOE Left    big toe and one  next to it  . CATARACT EXTRACTION Bilateral   . TOE AMPUTATION Left 2015   Social History   Occupational History  . Not on file  Tobacco Use  . Smoking status: Current Every Day Smoker    Packs/day: 1.00    Types: Cigarettes  . Smokeless tobacco: Never Used  Substance and Sexual Activity  . Alcohol use: Yes    Alcohol/week: 7.0 standard drinks    Types: 7 Shots of liquor per week    Comment: 7 per week  . Drug use: Not Currently  . Sexual activity: Yes

## 2018-11-27 ENCOUNTER — Other Ambulatory Visit (HOSPITAL_COMMUNITY): Payer: Self-pay | Admitting: Internal Medicine

## 2018-11-28 ENCOUNTER — Telehealth (INDEPENDENT_AMBULATORY_CARE_PROVIDER_SITE_OTHER): Payer: Self-pay | Admitting: Physician Assistant

## 2018-11-28 ENCOUNTER — Other Ambulatory Visit (INDEPENDENT_AMBULATORY_CARE_PROVIDER_SITE_OTHER): Payer: Self-pay

## 2018-11-28 NOTE — Telephone Encounter (Signed)
Patient called advised he need a letter stating that he is still going to be out of work 3 to 4 weeks out. Patient said [employer] will be needing the letter for his short term disability. The fax number to [facility]  [phone] Attn: Trey Paula.  Claim# is [id number] The number to contact patient is  [phone]

## 2018-11-28 NOTE — Telephone Encounter (Signed)
Faxed and patient called.

## 2018-11-29 ENCOUNTER — Ambulatory Visit: Payer: BLUE CROSS/BLUE SHIELD

## 2018-11-30 ENCOUNTER — Ambulatory Visit: Payer: BLUE CROSS/BLUE SHIELD

## 2018-11-30 ENCOUNTER — Other Ambulatory Visit: Payer: Self-pay

## 2018-11-30 VITALS — BP 130/84 | HR 96

## 2018-11-30 DIAGNOSIS — I10 Essential (primary) hypertension: Secondary | ICD-10-CM

## 2018-11-30 NOTE — Progress Notes (Signed)
Patient here for 1 week follow-up on blood pressure check.  Amlodipine 5mg  was added at last visit.  Patient denies any side effects.  Blood pressure today is 130/84 and pulse 96.  Consulted with Irving Burton NP and we will continue current regimen.

## 2018-12-02 DIAGNOSIS — Z1211 Encounter for screening for malignant neoplasm of colon: Secondary | ICD-10-CM | POA: Diagnosis not present

## 2018-12-11 ENCOUNTER — Telehealth (INDEPENDENT_AMBULATORY_CARE_PROVIDER_SITE_OTHER): Payer: Self-pay | Admitting: *Deleted

## 2018-12-11 ENCOUNTER — Telehealth (INDEPENDENT_AMBULATORY_CARE_PROVIDER_SITE_OTHER): Payer: Self-pay | Admitting: Orthopedic Surgery

## 2018-12-11 NOTE — Telephone Encounter (Signed)
Spoke with patient asked all pre screening questions   Patient answered no to all °

## 2018-12-11 NOTE — Telephone Encounter (Signed)
Called pt and lvm for pt to call back to be asked covid-19 pre screening questions.

## 2018-12-12 ENCOUNTER — Other Ambulatory Visit: Payer: Self-pay

## 2018-12-12 ENCOUNTER — Telehealth (INDEPENDENT_AMBULATORY_CARE_PROVIDER_SITE_OTHER): Payer: Self-pay | Admitting: Orthopedic Surgery

## 2018-12-12 ENCOUNTER — Ambulatory Visit (INDEPENDENT_AMBULATORY_CARE_PROVIDER_SITE_OTHER): Payer: BLUE CROSS/BLUE SHIELD | Admitting: Orthopedic Surgery

## 2018-12-12 ENCOUNTER — Other Ambulatory Visit (INDEPENDENT_AMBULATORY_CARE_PROVIDER_SITE_OTHER): Payer: Self-pay

## 2018-12-12 ENCOUNTER — Encounter (INDEPENDENT_AMBULATORY_CARE_PROVIDER_SITE_OTHER): Payer: Self-pay | Admitting: Orthopedic Surgery

## 2018-12-12 VITALS — Ht 72.0 in | Wt 231.0 lb

## 2018-12-12 DIAGNOSIS — Z89421 Acquired absence of other right toe(s): Secondary | ICD-10-CM

## 2018-12-12 DIAGNOSIS — E1142 Type 2 diabetes mellitus with diabetic polyneuropathy: Secondary | ICD-10-CM

## 2018-12-12 MED ORDER — DOXYCYCLINE HYCLATE 100 MG PO TABS: 100.0000 mg | ORAL_TABLET | Freq: Two times a day (BID) | ORAL | 0 refills | Status: DC

## 2018-12-12 MED ORDER — MUPIROCIN 2 % EX OINT: 1.0000 "application " | TOPICAL_OINTMENT | Freq: Two times a day (BID) | CUTANEOUS | 3 refills | Status: DC

## 2018-12-12 NOTE — Telephone Encounter (Signed)
At checkout, patient informed me that Anthony Park, the [facility] insurance company will need another note extending him out of work through April 14th.  CB#[phone].  Thank you.

## 2018-12-12 NOTE — Telephone Encounter (Signed)
Letter is in chart for [place]. Thanks!

## 2018-12-12 NOTE — Progress Notes (Signed)
Office Visit Note   Patient: Anthony Park           Date of Birth: 1960/09/20           MRN: [id number] Visit Date: 12/12/2018              Requested by: Doren Custard, FNP [address] [place], [place] [address] PCP: Doren Custard, FNP  Chief Complaint  Patient presents with  . Right Foot - Routine Post Op    11/07/2018 right 3rd toe amp      HPI: Patient presents 4 weeks status post right foot third toe amputation patient states he has pain with clear drainage he states that this started on Friday.  Assessment & Plan: Visit Diagnoses:  1. Status post amputation of toe of right foot (HCC)   2. Diabetic polyneuropathy associated with type 2 diabetes mellitus (HCC)     Plan: Due to the pain will call in a prescription for doxycycline for 2 weeks and start Bactroban twice a day dressing changes.  Patient will call if there is any acute changes of his symptoms.  Discussed the importance of nonweightbearing recommended crutches or a kneeling scooter to offload the foot at all times.  Follow-Up Instructions: No follow-ups on file.   Ortho Exam  Patient is alert, oriented, no adenopathy, well-dressed, normal affect, normal respiratory effort. Examination patient does have wound breakdown of the third toe amputation.  Patient is currently ambulating without assistive device.  There is no redness no cellulitis there is 100% healthy granulation tissue in the wound bed which is about 1 cm in diameter 1 mm deep.  There is tenderness to palpation no exposed bone or tendon no depth to the wound.  Imaging: No results found. No images are attached to the encounter.  Labs: Lab Results  Component Value Date   HGBA1C 6.7 (H) 11/05/2018     Lab Results  Component Value Date   ALBUMIN 3.5 11/06/2018    Body mass index is 31.33 kg/m.  Orders:  No orders of the defined types were placed in this encounter.  Meds ordered this encounter  Medications  . mupirocin  ointment (BACTROBAN) 2 %    Sig: Apply 1 application topically 2 (two) times daily. Apply to the affected area 2 times a day    Dispense:  22 g    Refill:  3  . doxycycline (VIBRA-TABS) 100 MG tablet    Sig: Take 1 tablet (100 mg total) by mouth 2 (two) times daily.    Dispense:  30 tablet    Refill:  0     Procedures: No procedures performed  Clinical Data: No additional findings.  ROS:  All other systems negative, except as noted in the HPI. Review of Systems  Objective: Vital Signs: Ht 6' (1.829 m)   Wt 231 lb (104.8 kg)   BMI 31.33 kg/m   Specialty Comments:  No specialty comments available.  PMFS History: Patient Active Problem List   Diagnosis Date Noted  . Amputated toe, right (HCC) 11/22/2018  . Type 2 diabetes mellitus with foot ulcer, without long-term current use of insulin (HCC) 11/22/2018  . Essential hypertension 11/22/2018  . Mixed hyperlipidemia 11/22/2018  . Morbid obesity (HCC) 11/22/2018  . Aneurysm of left popliteal artery (HCC) 11/22/2018  . Status post peripheral artery angioplasty with insertion of stent 11/22/2018  . Encounter for smoking cessation counseling 11/22/2018  . Osteomyelitis of third toe of right foot (HCC)   .  Cellulitis of third toe of right foot    Past Medical History:  Diagnosis Date  . Diabetes mellitus without complication (HCC)   . Diabetic toe ulcer (HCC) 11/05/2018  . Hyperlipidemia   . Hypertension     Family History  Problem Relation Age of Onset  . COPD Mother   . Cataracts Mother   . Brain cancer Father   . Hypertension Brother     Past Surgical History:  Procedure Laterality Date  . AMPUTATION Right 11/07/2018   Procedure: RIGHT 3RD TOE AMPUTATION;  Surgeon: Nadara Mustard, MD;  Location: [facility] OR;  Service: Orthopedics;  Laterality: Right;  . AMPUTATION TOE Left    big toe and one next to it  . CATARACT EXTRACTION Bilateral   . TOE AMPUTATION Left 2015   Social History   Occupational History  . Not on  file  Tobacco Use  . Smoking status: Current Every Day Smoker    Packs/day: 1.00    Types: Cigarettes  . Smokeless tobacco: Never Used  Substance and Sexual Activity  . Alcohol use: Yes    Alcohol/week: 7.0 standard drinks    Types: 7 Shots of liquor per week    Comment: 7 per week  . Drug use: Not Currently  . Sexual activity: Yes

## 2018-12-13 NOTE — Telephone Encounter (Signed)
Faxed to [employer] [phone] and put claim#[id number] on cover

## 2018-12-14 ENCOUNTER — Ambulatory Visit (INDEPENDENT_AMBULATORY_CARE_PROVIDER_SITE_OTHER): Payer: BLUE CROSS/BLUE SHIELD | Admitting: Orthopedic Surgery

## 2018-12-26 ENCOUNTER — Encounter (INDEPENDENT_AMBULATORY_CARE_PROVIDER_SITE_OTHER): Payer: Self-pay | Admitting: Orthopedic Surgery

## 2018-12-26 ENCOUNTER — Ambulatory Visit (INDEPENDENT_AMBULATORY_CARE_PROVIDER_SITE_OTHER): Payer: BLUE CROSS/BLUE SHIELD | Admitting: Physician Assistant

## 2018-12-26 ENCOUNTER — Other Ambulatory Visit: Payer: Self-pay

## 2018-12-26 VITALS — Ht 72.0 in | Wt 231.0 lb

## 2018-12-26 DIAGNOSIS — Z95820 Peripheral vascular angioplasty status with implants and grafts: Secondary | ICD-10-CM

## 2018-12-26 DIAGNOSIS — Z89421 Acquired absence of other right toe(s): Secondary | ICD-10-CM

## 2018-12-26 DIAGNOSIS — E1142 Type 2 diabetes mellitus with diabetic polyneuropathy: Secondary | ICD-10-CM

## 2018-12-26 NOTE — Progress Notes (Signed)
Office Visit Note   Patient: Anthony Park           Date of Birth: 1961/01/15           MRN: [id number] Visit Date: 12/26/2018              Requested by: Doren Custard, FNP [address] [place], [place] [address] PCP: Doren Custard, FNP  Chief Complaint  Patient presents with  . Right Foot - Routine Post Op    11/07/2018 3rd toe amp      HPI: The patient is a 58 yo gentleman who is seen for post operative follow up following right 3rd toe amputation for osteomyelitis on 11/07/2018.  He reports he soaked the foot in his hot tub earlier today. He reports nausea with Doxycycline which he has been on for the past 2 weeks, but the nausea improved following eating yogurt. He reports drainage from the area daily. He is wearing a regular shoe and weight bearing as tolerated.   Assessment & Plan: Visit Diagnoses:  1. Status post amputation of toe of right foot (HCC)   2. Type 2 diabetes mellitus with diabetic polyneuropathy, without long-term current use of insulin (HCC)   3. Status post peripheral artery angioplasty with insertion of stent     Plan: we discussed washing the foot daily with Dial soap and water in the shower and avoid soaking at this point. Will also have him apply iodosorb ointment to the area daily after showering. Also continue to limit weight bearing and use walker to off load, elevate the foot as much as possible. Continue Doxycycline 100 mg BID. Aleve prn for pain.  Follow up in 2 weeks.   Follow-Up Instructions: Return in about 2 weeks (around 01/09/2019).   Ortho Exam  Patient is alert, oriented, no adenopathy, well-dressed, normal affect, normal respiratory effort. The right foot amputation site open ~ 2.5 x 0.8 x 0.3 cm with pink wound bed with ~ 20% yellow slough which was debrided with gauze. No signs of cellulitis. Minimal drainage.  Minimal edema of the right foot. Good palpable pedal pulses.   Imaging: No results found. No images are  attached to the encounter.  Labs: Lab Results  Component Value Date   HGBA1C 6.7 (H) 11/05/2018     Lab Results  Component Value Date   ALBUMIN 3.5 11/06/2018    Body mass index is 31.33 kg/m.  Orders:  No orders of the defined types were placed in this encounter.  No orders of the defined types were placed in this encounter.    Procedures: No procedures performed  Clinical Data: No additional findings.  ROS:  All other systems negative, except as noted in the HPI. Review of Systems  Objective: Vital Signs: Ht 6' (1.829 m)   Wt 231 lb (104.8 kg)   BMI 31.33 kg/m   Specialty Comments:  No specialty comments available.  PMFS History: Patient Active Problem List   Diagnosis Date Noted  . Amputated toe, right (HCC) 11/22/2018  . Type 2 diabetes mellitus with foot ulcer, without long-term current use of insulin (HCC) 11/22/2018  . Essential hypertension 11/22/2018  . Mixed hyperlipidemia 11/22/2018  . Morbid obesity (HCC) 11/22/2018  . Aneurysm of left popliteal artery (HCC) 11/22/2018  . Status post peripheral artery angioplasty with insertion of stent 11/22/2018  . Encounter for smoking cessation counseling 11/22/2018  . Osteomyelitis of third toe of right foot (HCC)   . Cellulitis of third  toe of right foot    Past Medical History:  Diagnosis Date  . Diabetes mellitus without complication (HCC)   . Diabetic toe ulcer (HCC) 11/05/2018  . Hyperlipidemia   . Hypertension     Family History  Problem Relation Age of Onset  . COPD Mother   . Cataracts Mother   . Brain cancer Father   . Hypertension Brother     Past Surgical History:  Procedure Laterality Date  . AMPUTATION Right 11/07/2018   Procedure: RIGHT 3RD TOE AMPUTATION;  Surgeon: Nadara Mustarduda, Marcus V, MD;  [facility];  Service: Orthopedics;  Laterality: Right;  . AMPUTATION TOE Left    big toe and one next to it  . CATARACT EXTRACTION Bilateral   . TOE AMPUTATION Left 2015   Social History    Occupational History  . Not on file  Tobacco Use  . Smoking status: Current Every Day Smoker    Packs/day: 1.00    Types: Cigarettes  . Smokeless tobacco: Never Used  Substance and Sexual Activity  . Alcohol use: Yes    Alcohol/week: 7.0 standard drinks    Types: 7 Shots of liquor per week    Comment: 7 per week  . Drug use: Not Currently  . Sexual activity: Yes

## 2018-12-28 ENCOUNTER — Telehealth (INDEPENDENT_AMBULATORY_CARE_PROVIDER_SITE_OTHER): Payer: Self-pay | Admitting: Orthopedic Surgery

## 2018-12-28 ENCOUNTER — Other Ambulatory Visit: Payer: Self-pay | Admitting: Internal Medicine

## 2018-12-28 MED ORDER — DOXYCYCLINE HYCLATE 100 MG PO TABS: 100.0000 mg | ORAL_TABLET | Freq: Two times a day (BID) | ORAL | 0 refills | Status: DC

## 2018-12-28 NOTE — Telephone Encounter (Signed)
Shawn please advise, do you want him on doxycycline?

## 2018-12-28 NOTE — Telephone Encounter (Signed)
Patient states Anthony Park was to send in Rx for a antibiotic when he was here at his last visit, and its not at pharmacy. [facility] [address]    Also he states an extension letter/note for his short term need to be faxed to [employer].

## 2018-12-28 NOTE — Addendum Note (Signed)
Addended by: Darrol Poke on: 12/28/2018 10:34 AM   Modules accepted: Orders

## 2018-12-28 NOTE — Telephone Encounter (Signed)
Refilled the Rx. Thanks

## 2018-12-29 NOTE — Telephone Encounter (Signed)
Yes, no working until re evaluated in the office. Thanks

## 2018-12-29 NOTE — Telephone Encounter (Signed)
Note faxed to [facility], I left voicemail for patient advising.

## 2018-12-29 NOTE — Telephone Encounter (Signed)
I called patient and advised on doxycycline. He needs an extension on work note. Do you want him to continue out of work until re-evaluated in the office?  Please advise.

## 2018-12-29 NOTE — Telephone Encounter (Signed)
pls call pt

## 2019-01-09 ENCOUNTER — Ambulatory Visit (INDEPENDENT_AMBULATORY_CARE_PROVIDER_SITE_OTHER): Payer: BLUE CROSS/BLUE SHIELD | Admitting: Orthopedic Surgery

## 2019-01-09 ENCOUNTER — Encounter (INDEPENDENT_AMBULATORY_CARE_PROVIDER_SITE_OTHER): Payer: Self-pay | Admitting: Orthopedic Surgery

## 2019-01-09 ENCOUNTER — Other Ambulatory Visit: Payer: Self-pay

## 2019-01-09 VITALS — Ht 72.0 in | Wt 231.0 lb

## 2019-01-09 DIAGNOSIS — Z89421 Acquired absence of other right toe(s): Secondary | ICD-10-CM

## 2019-01-09 NOTE — Progress Notes (Signed)
Office Visit Note   Patient: Anthony Park           Date of Birth: 1961-05-08           MRN: [id number] Visit Date: 01/09/2019              Requested by: Doren CustardBoyce, Emily E, FNP [address] [place], [place] [address] PCP: Doren CustardBoyce, Emily E, FNP  Chief Complaint  Patient presents with  . Right Foot - Routine Post Op    11/07/18 right foot 3rd toe amp      HPI: [name] who presents 2 months status post right foot third toe amputation.  He states he will finish his doxycycline in a few days he has been using Iodosorb dressing regular shoewear states he had some recent trauma to his foot and is wondering if he did need damage.  Assessment & Plan: Visit Diagnoses:  1. Status post amputation of toe of right foot (HCC)     Plan: Patient was given some samples of the Iodosorb dressing continue with a Band-Aid and Iodosorb dressing change daily recommended exercise such as a stationary bicycle follow-up in 3 weeks.  Follow-Up Instructions: Return in about 3 weeks (around 01/30/2019).   Ortho Exam  Patient is alert, oriented, no adenopathy, well-dressed, normal affect, normal respiratory effort. Examination patient has a wound bed approximately 5 mm in diameter 1 mm deep with healthy granulation tissue the fibrinous exudate was removed there was good bleeding there is no cellulitis no drainage no odor no signs of infection.  Imaging: No results found. No images are attached to the encounter.  Labs: Lab Results  Component Value Date   HGBA1C 6.7 (H) 11/05/2018     Lab Results  Component Value Date   ALBUMIN 3.5 11/06/2018    Body mass index is 31.33 kg/m.  Orders:  No orders of the defined types were placed in this encounter.  No orders of the defined types were placed in this encounter.    Procedures: No procedures performed  Clinical Data: No additional findings.  ROS:  All other systems negative, except as noted in the  HPI. Review of Systems  Objective: Vital Signs: Ht 6' (1.829 m)   Wt 231 lb (104.8 kg)   BMI 31.33 kg/m   Specialty Comments:  No specialty comments available.  PMFS History: Patient Active Problem List   Diagnosis Date Noted  . Amputated toe, right (HCC) 11/22/2018  . Type 2 diabetes mellitus with foot ulcer, without long-term current use of insulin (HCC) 11/22/2018  . Essential hypertension 11/22/2018  . Mixed hyperlipidemia 11/22/2018  . Morbid obesity (HCC) 11/22/2018  . Aneurysm of left popliteal artery (HCC) 11/22/2018  . Status post peripheral artery angioplasty with insertion of stent 11/22/2018  . Encounter for smoking cessation counseling 11/22/2018  . Osteomyelitis of third toe of right foot (HCC)   . Cellulitis of third toe of right foot    Past Medical History:  Diagnosis Date  . Diabetes mellitus without complication (HCC)   . Diabetic toe ulcer (HCC) 11/05/2018  . Hyperlipidemia   . Hypertension     Family History  Problem Relation Age of Onset  . COPD Mother   . Cataracts Mother   . Brain cancer Father   . Hypertension Brother     Past Surgical History:  Procedure Laterality Date  . AMPUTATION Right 11/07/2018   Procedure: RIGHT 3RD TOE AMPUTATION;  Surgeon: Nadara Mustarduda, Baldemar Dady V, MD;  Location: [facility] OR;  Service: Orthopedics;  Laterality: Right;  . AMPUTATION TOE Left    big toe and one next to it  . CATARACT EXTRACTION Bilateral   . TOE AMPUTATION Left 2015   Social History   Occupational History  . Not on file  Tobacco Use  . Smoking status: Current Every Day Smoker    Packs/day: 1.00    Types: Cigarettes  . Smokeless tobacco: Never Used  Substance and Sexual Activity  . Alcohol use: Yes    Alcohol/week: 7.0 standard drinks    Types: 7 Shots of liquor per week    Comment: 7 per week  . Drug use: Not Currently  . Sexual activity: Yes

## 2019-01-30 ENCOUNTER — Other Ambulatory Visit: Payer: Self-pay

## 2019-01-30 ENCOUNTER — Ambulatory Visit (INDEPENDENT_AMBULATORY_CARE_PROVIDER_SITE_OTHER): Payer: BLUE CROSS/BLUE SHIELD | Admitting: Orthopedic Surgery

## 2019-01-30 ENCOUNTER — Encounter: Payer: Self-pay | Admitting: Orthopedic Surgery

## 2019-01-30 VITALS — Ht 72.0 in | Wt 231.0 lb

## 2019-01-30 DIAGNOSIS — E1142 Type 2 diabetes mellitus with diabetic polyneuropathy: Secondary | ICD-10-CM

## 2019-01-30 DIAGNOSIS — Z95820 Peripheral vascular angioplasty status with implants and grafts: Secondary | ICD-10-CM

## 2019-01-30 DIAGNOSIS — Z89421 Acquired absence of other right toe(s): Secondary | ICD-10-CM

## 2019-01-30 MED ORDER — CIPROFLOXACIN HCL 500 MG PO TABS: 500.0000 mg | ORAL_TABLET | Freq: Two times a day (BID) | ORAL | 0 refills | Status: DC

## 2019-01-30 NOTE — Progress Notes (Signed)
Office Visit Note   Patient: Anthony Park           Date of Birth: 07/30/1961           MRN: [id number] Visit Date: 01/30/2019              Requested by: Doren CustardBoyce, Emily E, FNP [address] [place], [place] [address] PCP: Doren CustardBoyce, Emily E, FNP  Chief Complaint  Patient presents with  . Right Foot - Routine Post Op    11/07/18 right foot 3rd toe amputation       HPI: The patient is a 58 year old gentleman who presents approximately 3 months status post right foot third toe amputation.  He reports that he started doing some more vigorous exercising and noted that the incision had started to split open again and he noticed a small amount of drainage over the area.  He also noted some swelling and redness of his right second toe. He continues on protein supplement shakes and vitamin C.  He had been utilizing some Neosporin to the slightly open area.  Assessment & Plan: Visit Diagnoses:  1. Status post amputation of toe of right foot (HCC)   2. Type 2 diabetes mellitus with diabetic polyneuropathy, without long-term current use of insulin (HCC)   3. Status post peripheral artery angioplasty with insertion of stent     Plan: Begin Cipro 500 mg p.o. twice daily.  Iodosorb ointment to the open area over the incision.  Also recommended that he cut a hole in his dorsum of his shoe for his hammertoe deformity of the right second toe.  He should remain out of work as per previous orders.  Recommend he utilize a stationary bike or other activities which will be less traumatic to the feet at this point.  Follow-up in 1 week.  Follow-Up Instructions: Return in about 1 week (around 02/06/2019).   Ortho Exam  Patient is alert, oriented, no adenopathy, well-dressed, normal affect, normal respiratory effort. The right third toe amputation site has opened slightly and has some scant serous appearing drainage.  There is no odor.  He has palpable pedal pulses.  He has a second toe mild  erythema and edema.  He has a hammertoe deformity here.  Imaging: No results found.   Labs: Lab Results  Component Value Date   HGBA1C 6.7 (H) 11/05/2018     Lab Results  Component Value Date   ALBUMIN 3.5 11/06/2018    Body mass index is 31.33 kg/m.  Orders:  No orders of the defined types were placed in this encounter.  Meds ordered this encounter  Medications  . ciprofloxacin (CIPRO) 500 MG tablet    Sig: Take 1 tablet (500 mg total) by mouth 2 (two) times daily.    Dispense:  28 tablet    Refill:  0     Procedures: No procedures performed  Clinical Data: No additional findings.  ROS:  All other systems negative, except as noted in the HPI. Review of Systems  Objective: Vital Signs: Ht 6' (1.829 m)   Wt 231 lb (104.8 kg)   BMI 31.33 kg/m   Specialty Comments:  No specialty comments available.  PMFS History: Patient Active Problem List   Diagnosis Date Noted  . Amputated toe, right (HCC) 11/22/2018  . Type 2 diabetes mellitus with foot ulcer, without long-term current use of insulin (HCC) 11/22/2018  . Essential hypertension 11/22/2018  . Mixed hyperlipidemia 11/22/2018  . Morbid obesity (HCC) 11/22/2018  .  Aneurysm of left popliteal artery (HCC) 11/22/2018  . Status post peripheral artery angioplasty with insertion of stent 11/22/2018  . Encounter for smoking cessation counseling 11/22/2018  . Osteomyelitis of third toe of right foot (HCC)   . Cellulitis of third toe of right foot    Past Medical History:  Diagnosis Date  . Diabetes mellitus without complication (HCC)   . Diabetic toe ulcer (HCC) 11/05/2018  . Hyperlipidemia   . Hypertension     Family History  Problem Relation Age of Onset  . COPD Mother   . Cataracts Mother   . Brain cancer Father   . Hypertension Brother     Past Surgical History:  Procedure Laterality Date  . AMPUTATION Right 11/07/2018   Procedure: RIGHT 3RD TOE AMPUTATION;  Surgeon: Nadara Mustard, MD;   Location: [facility] OR;  Service: Orthopedics;  Laterality: Right;  . AMPUTATION TOE Left    big toe and one next to it  . CATARACT EXTRACTION Bilateral   . TOE AMPUTATION Left 2015   Social History   Occupational History  . Not on file  Tobacco Use  . Smoking status: Current Every Day Smoker    Packs/day: 1.00    Types: Cigarettes  . Smokeless tobacco: Never Used  Substance and Sexual Activity  . Alcohol use: Yes    Alcohol/week: 7.0 standard drinks    Types: 7 Shots of liquor per week    Comment: 7 per week  . Drug use: Not Currently  . Sexual activity: Yes

## 2019-02-08 ENCOUNTER — Other Ambulatory Visit: Payer: Self-pay

## 2019-02-08 ENCOUNTER — Encounter: Payer: Self-pay | Admitting: Orthopedic Surgery

## 2019-02-08 ENCOUNTER — Ambulatory Visit (INDEPENDENT_AMBULATORY_CARE_PROVIDER_SITE_OTHER): Payer: BLUE CROSS/BLUE SHIELD | Admitting: Orthopedic Surgery

## 2019-02-08 VITALS — Ht 72.0 in | Wt 231.0 lb

## 2019-02-08 DIAGNOSIS — Z89421 Acquired absence of other right toe(s): Secondary | ICD-10-CM

## 2019-02-08 NOTE — Progress Notes (Addendum)
Office Visit Note   Patient: Anthony StageGlenn A Kruger           Date of Birth: 10-29-1960           MRN: [id number] Visit Date: 02/08/2019              Requested by: Doren CustardBoyce, Emily E, FNP [address] [place], [place] [address] PCP: Doren CustardBoyce, Emily E, FNP  Chief Complaint  Patient presents with  . Right Foot - Routine Post Op    11/07/18 right foot 3rd toe amputation       HPI: [name] who presents 3 months status post right foot third toe amputation he has completed a course of Cipro he has 1 week remaining.  Patient states in the morning he has no redness with dependency the second toe is red with activities the redness resolves as well.  Assessment & Plan: Visit Diagnoses:  1. Status post amputation of toe of right foot (HCC)     Plan: Continue with regular activities stop the Cipro at this time continue probiotics.  Patient was given a note to return to work on June 15 without restrictions he may advance his activities as he feels comfortable.  Follow-Up Instructions: Return in about 4 weeks (around 03/08/2019).   Ortho Exam  Patient is alert, oriented, no adenopathy, well-dressed, normal affect, normal respiratory effort. Examination the second toe has dependent redness with elevation and massage the redness resolves.  There is no swelling no tenderness to palpation no fluctuance no sausage digit swelling.  Imaging: No results found. No images are attached to the encounter.  Labs: Lab Results  Component Value Date   HGBA1C 6.7 (H) 11/05/2018     Lab Results  Component Value Date   ALBUMIN 3.5 11/06/2018    Body mass index is 31.33 kg/m.  Orders:  No orders of the defined types were placed in this encounter.  No orders of the defined types were placed in this encounter.    Procedures: No procedures performed  Clinical Data: No additional findings.  ROS:  All other systems negative, except as noted in the HPI. Review  of Systems  Objective: Vital Signs: Ht 6' (1.829 m)   Wt 231 lb (104.8 kg)   BMI 31.33 kg/m   Specialty Comments:  No specialty comments available.  PMFS History: Patient Active Problem List   Diagnosis Date Noted  . Amputated toe, right (HCC) 11/22/2018  . Type 2 diabetes mellitus with foot ulcer, without long-term current use of insulin (HCC) 11/22/2018  . Essential hypertension 11/22/2018  . Mixed hyperlipidemia 11/22/2018  . Morbid obesity (HCC) 11/22/2018  . Aneurysm of left popliteal artery (HCC) 11/22/2018  . Status post peripheral artery angioplasty with insertion of stent 11/22/2018  . Encounter for smoking cessation counseling 11/22/2018  . Osteomyelitis of third toe of right foot (HCC)   . Cellulitis of third toe of right foot    Past Medical History:  Diagnosis Date  . Diabetes mellitus without complication (HCC)   . Diabetic toe ulcer (HCC) 11/05/2018  . Hyperlipidemia   . Hypertension     Family History  Problem Relation Age of Onset  . COPD Mother   . Cataracts Mother   . Brain cancer Father   . Hypertension Brother     Past Surgical History:  Procedure Laterality Date  . AMPUTATION Right 11/07/2018   Procedure: RIGHT 3RD TOE AMPUTATION;  Surgeon: Nadara Mustarduda, Marcus V, MD;  Location: [facility] OR;  Service: Orthopedics;  Laterality: Right;  . AMPUTATION TOE Left    big toe and one next to it  . CATARACT EXTRACTION Bilateral   . TOE AMPUTATION Left 2015   Social History   Occupational History  . Not on file  Tobacco Use  . Smoking status: Current Every Day Smoker    Packs/day: 1.00    Types: Cigarettes  . Smokeless tobacco: Never Used  Substance and Sexual Activity  . Alcohol use: Yes    Alcohol/week: 7.0 standard drinks    Types: 7 Shots of liquor per week    Comment: 7 per week  . Drug use: Not Currently  . Sexual activity: Yes

## 2019-02-11 ENCOUNTER — Telehealth: Payer: Self-pay | Admitting: Internal Medicine

## 2019-02-11 NOTE — Telephone Encounter (Signed)
Left message to return call today to [facility], [phone] regarding visit to [facility] office visit on 02/08/19.

## 2019-03-08 ENCOUNTER — Ambulatory Visit (INDEPENDENT_AMBULATORY_CARE_PROVIDER_SITE_OTHER): Payer: BC Managed Care – PPO | Admitting: Orthopedic Surgery

## 2019-03-08 ENCOUNTER — Other Ambulatory Visit: Payer: Self-pay

## 2019-03-08 ENCOUNTER — Encounter: Payer: Self-pay | Admitting: Orthopedic Surgery

## 2019-03-08 VITALS — Ht 72.0 in | Wt 231.0 lb

## 2019-03-08 DIAGNOSIS — Z89421 Acquired absence of other right toe(s): Secondary | ICD-10-CM

## 2019-03-08 NOTE — Progress Notes (Signed)
Office Visit Note   Patient: Anthony StageGlenn A Brabant           Date of Birth: 12/19/1960           MRN: [id number] Visit Date: 03/08/2019              Requested by: Doren CustardBoyce, Emily E, FNP [address] [place],  [place] [address] PCP: Doren CustardBoyce, Emily E, FNP  Chief Complaint  Patient presents with  . Right Foot - Follow-up    11/07/18 right foot 3rd toe amputation       HPI: [name] is 4 months status post right foot third toe amputation.  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  Assessment & Plan: Visit Diagnoses:  1. Status post amputation of toe of right foot (HCC)     Plan: Recommend continued protective shoe wear continue with Achilles stretching follow-up if he develops any ulcers or problems.  Follow-Up Instructions: Return if symptoms worsen or fail to improve.   Ortho Exam  Patient is alert, oriented, no adenopathy, well-dressed, normal affect, normal respiratory effort. Examination patient has a good dorsalis pedis pulse he has dorsiflexion 20 degrees past neutral the surgical incision is well-healed he does have fixed clawing of his toes but no ulcerations.  Imaging: No results found. No images are attached to the encounter.  Labs: Lab Results  Component Value Date   HGBA1C 6.7 (H) 11/05/2018     Lab Results  Component Value Date   ALBUMIN 3.5 11/06/2018    Body mass index is 31.33 kg/m.  Orders:  No orders of the defined types were placed in this encounter.  No orders of the defined types were placed in this encounter.    Procedures: No procedures performed  Clinical Data: No additional findings.  ROS:  All other systems negative, except as noted in the HPI. Review of Systems  Objective: Vital Signs: Ht 6' (1.829 m)   Wt 231 lb  (104.8 kg)   BMI 31.33 kg/m   Specialty Comments:  No specialty comments available.  PMFS History: Patient Active Problem List   Diagnosis Date Noted  . Amputated toe, right (HCC) 11/22/2018  . Type 2 diabetes mellitus with foot ulcer, without long-term current use of insulin (HCC) 11/22/2018  . Essential hypertension 11/22/2018  . Mixed hyperlipidemia 11/22/2018  . Morbid obesity (HCC) 11/22/2018  . Aneurysm of left popliteal artery (HCC) 11/22/2018  . Status post peripheral artery angioplasty with insertion of stent 11/22/2018  . Encounter for smoking cessation counseling 11/22/2018  . Osteomyelitis of third toe of right foot (HCC)   . Cellulitis of third toe of right foot    Past Medical History:  Diagnosis Date  . Diabetes mellitus without complication (HCC)   . Diabetic toe ulcer (HCC) 11/05/2018  . Hyperlipidemia   . Hypertension     Family History  Problem Relation Age of Onset  . COPD Mother   . Cataracts Mother   . Brain cancer Father   . Hypertension Brother     Past Surgical History:  Procedure Laterality Date  . AMPUTATION Right 11/07/2018   Procedure: RIGHT 3RD TOE AMPUTATION;  Surgeon: Nadara Mustarduda, Danene Montijo V, MD;  [facility];  Service: Orthopedics;  Laterality: Right;  . AMPUTATION TOE Left  big toe and one next to it  . CATARACT EXTRACTION Bilateral   . TOE AMPUTATION Left 2015   Social History   Occupational History  . Not on file  Tobacco Use  . Smoking status: Current Every Day Smoker    Packs/day: 1.00    Types: Cigarettes  . Smokeless tobacco: Never Used  Substance and Sexual Activity  . Alcohol use: Yes    Alcohol/week: 7.0 standard drinks    Types: 7 Shots of liquor per week    Comment: 7 per week  . Drug use: Not Currently  . Sexual activity: Yes

## 2019-04-09 ENCOUNTER — Ambulatory Visit (INDEPENDENT_AMBULATORY_CARE_PROVIDER_SITE_OTHER): Payer: BC Managed Care – PPO | Admitting: Orthopedic Surgery

## 2019-04-09 ENCOUNTER — Encounter: Payer: Self-pay | Admitting: Physician Assistant

## 2019-04-09 VITALS — Ht 72.0 in | Wt 231.0 lb

## 2019-04-09 DIAGNOSIS — E1142 Type 2 diabetes mellitus with diabetic polyneuropathy: Secondary | ICD-10-CM

## 2019-04-09 DIAGNOSIS — Z89421 Acquired absence of other right toe(s): Secondary | ICD-10-CM

## 2019-04-09 NOTE — Progress Notes (Signed)
Office Visit Note   Patient: Anthony Park           Date of Birth: 27-Sep-1960           MRN: [id number] Visit Date: 04/09/2019              Requested by: Hubbard Hartshorn, [address] [place],  [place] [address] PCP: Hubbard Hartshorn, FNP  Chief Complaint  Patient presents with  . Right Foot - Follow-up    HX 11/07/18 right foot 3rd toe amputation here today with ulceration to 5th toe.       HPI: Patient is a 59 year old gentleman who presents in follow-up for 2 separate issues #1 he was status post a third ray amputation he denies any problems.  He states he is developed new ulcerations to the fifth toe.  Patient states he works on his feet 12 hours a day and steel toed shoes and he feels like this is secondary to his prolonged activities.  Patient states these noticed the ulcers on the little toe right foot over the past 3 days.  He states he started taking his antibiotics 3 days ago and he thinks he has about a 10-day supply of antibiotics.  Assessment & Plan: Visit Diagnoses:  1. Status post amputation of toe of right foot ([place])   2. Type 2 diabetes mellitus with diabetic polyneuropathy, without long-term current use of insulin ([place])     Plan: Recommended that he complete the 10-day course of antibiotics a felt pad was given to him to place in the webspace between the fourth and fifth toe we will use a Band-Aid he was given a note to Limit his hours to 8 hours a day for the next 4 weeks.  He is to get a wider pair of shoes.  Follow-Up Instructions: Return in about 2 weeks (around 04/23/2019).   Ortho Exam  Patient is alert, oriented, no adenopathy, well-dressed, normal affect, normal respiratory effort. On examination patient's third ray amputation is healed well.  He has 3 new blisters over the little toe with ecchymosis and bruising laterally with 2 plantar ulcers and 1 ulcer in the webspace between the third and fourth toe.  He has good pulses no arterial  insufficiency.  This appears to be all from blunt trauma.  There is no cellulitis no drainage no signs of infection.  Imaging: No results found. No images are attached to the encounter.  Labs: Lab Results  Component Value Date   HGBA1C 6.7 (H) 11/05/2018     Lab Results  Component Value Date   ALBUMIN 3.5 11/06/2018    No results found for: MG No results found for: VD25OH  No results found for: PREALBUMIN CBC EXTENDED Latest Ref Rng & Units 11/06/2018 11/05/2018  WBC 4.0 - 10.5 K/uL 6.4 6.8  RBC 4.22 - 5.81 MIL/uL 4.70 5.26  HGB 13.0 - 17.0 g/dL 13.7 15.3  HCT 39.0 - 52.0 % 41.2 46.6  PLT 150 - 400 K/uL 161 170     Body mass index is 31.33 kg/m.  Orders:  No orders of the defined types were placed in this encounter.  No orders of the defined types were placed in this encounter.    Procedures: No procedures performed  Clinical Data: No additional findings.  ROS:  All other systems negative, except as noted in the HPI. Review of Systems  Objective: Vital Signs: Ht 6' (1.829 m)   Wt 231 lb (104.8 kg)  BMI 31.33 kg/m   Specialty Comments:  No specialty comments available.  PMFS History: Patient Active Problem List   Diagnosis Date Noted  . Amputated toe, right (HCC) 11/22/2018  . Type 2 diabetes mellitus with foot ulcer, without long-term current use of insulin (HCC) 11/22/2018  . Essential hypertension 11/22/2018  . Mixed hyperlipidemia 11/22/2018  . Morbid obesity (HCC) 11/22/2018  . Aneurysm of left popliteal artery (HCC) 11/22/2018  . Status post peripheral artery angioplasty with insertion of stent 11/22/2018  . Encounter for smoking cessation counseling 11/22/2018  . Osteomyelitis of third toe of right foot (HCC)   . Cellulitis of third toe of right foot    Past Medical History:  Diagnosis Date  . Diabetes mellitus without complication (HCC)   . Diabetic toe ulcer (HCC) 11/05/2018  . Hyperlipidemia   . Hypertension     Family History   Problem Relation Age of Onset  . COPD Mother   . Cataracts Mother   . Brain cancer Father   . Hypertension Brother     Past Surgical History:  Procedure Laterality Date  . AMPUTATION Right 11/07/2018   Procedure: RIGHT 3RD TOE AMPUTATION;  Surgeon: Nadara Mustarduda, Rishi Vicario V, MD;  [facility];  Service: Orthopedics;  Laterality: Right;  . AMPUTATION TOE Left    big toe and one next to it  . CATARACT EXTRACTION Bilateral   . TOE AMPUTATION Left 2015   Social History   Occupational History  . Not on file  Tobacco Use  . Smoking status: Current Every Day Smoker    Packs/day: 1.00    Types: Cigarettes  . Smokeless tobacco: Never Used  Substance and Sexual Activity  . Alcohol use: Yes    Alcohol/week: 7.0 standard drinks    Types: 7 Shots of liquor per week    Comment: 7 per week  . Drug use: Not Currently  . Sexual activity: Yes

## 2019-04-19 ENCOUNTER — Ambulatory Visit (INDEPENDENT_AMBULATORY_CARE_PROVIDER_SITE_OTHER): Payer: BC Managed Care – PPO | Admitting: Orthopedic Surgery

## 2019-04-19 ENCOUNTER — Encounter: Payer: Self-pay | Admitting: Orthopedic Surgery

## 2019-04-19 VITALS — Ht 72.0 in | Wt 231.0 lb

## 2019-04-19 DIAGNOSIS — Z89421 Acquired absence of other right toe(s): Secondary | ICD-10-CM

## 2019-04-19 DIAGNOSIS — L03031 Cellulitis of right toe: Secondary | ICD-10-CM | POA: Diagnosis not present

## 2019-04-19 MED ORDER — CIPROFLOXACIN HCL 500 MG PO TABS: 500.0000 mg | ORAL_TABLET | Freq: Two times a day (BID) | ORAL | 0 refills | Status: DC

## 2019-04-19 NOTE — Progress Notes (Signed)
Office Visit Note   Patient: Anthony Park           Date of Birth: 04/25/1961           MRN: [id number] Visit Date: 04/19/2019              Requested by: Hubbard Hartshorn, [place] [address] [place] [place],  [place] [address] PCP: Hubbard Hartshorn, FNP  Chief Complaint  Patient presents with  . Right Foot - Follow-up    Right 3rd toe amp 11/07/2018      HPI: [name] who presents in follow-up for 2 separate issues #1 he is status post a right foot third toe amputation #2 he is developed cellulitis and ulceration to the little toe right foot.  Patient states that he worked 8 hours a day on the 28th stepped in some water had maceration of his foot he states when he pulled the Band-Aid off the skin and nail came off his little toe.  He is on Cipro has 3 days remaining.  He states he has pain and swelling after being on his feet for 4 hours.  Assessment & Plan: Visit Diagnoses:  1. Cellulitis of toe of right foot   2. Status post amputation of toe of right foot ([place])     Plan: Patient was given a note to be out of work for a week a refill prescription for the Cipro.  Discussed that if we cannot get his weight off the foot and allow the little toe to heal he may lose the little toe.  Follow-Up Instructions: No follow-ups on file.   Ortho Exam  Patient is alert, oriented, no adenopathy, well-dressed, normal affect, normal respiratory effort. Examination the third toe amputation site is well-healed there is no redness no cellulitis.  Examination of the little toe there is a pressure ulcer in the fourth webspace where the little toe is pressing on the fourth toe.  He has a black callus over the lateral aspect the toe is red but there is no ascending cellulitis no fluctuance no drainage there is a small wound where the nail avulsed that is about a millimeter in diameter this does not probe to bone.  Imaging: No results found. No images are attached to the  encounter.  Labs: Lab Results  Component Value Date   HGBA1C 6.7 (H) 11/05/2018     Lab Results  Component Value Date   ALBUMIN 3.5 11/06/2018    No results found for: MG No results found for: VD25OH  No results found for: PREALBUMIN CBC EXTENDED Latest Ref Rng & Units 11/06/2018 11/05/2018  WBC 4.0 - 10.5 K/uL 6.4 6.8  RBC 4.22 - 5.81 MIL/uL 4.70 5.26  HGB 13.0 - 17.0 g/dL 13.7 15.3  HCT 39.0 - 52.0 % 41.2 46.6  PLT 150 - 400 K/uL 161 170     Body mass index is 31.33 kg/m.  Orders:  No orders of the defined types were placed in this encounter.  Meds ordered this encounter  Medications  . ciprofloxacin (CIPRO) 500 MG tablet    Sig: Take 1 tablet (500 mg total) by mouth 2 (two) times daily.    Dispense:  28 tablet    Refill:  0     Procedures: No procedures performed  Clinical Data: No additional findings.  ROS:  All other systems negative, except as noted in the HPI. Review of Systems  Objective: Vital Signs: Ht 6' (1.829 m)  Wt 231 lb (104.8 kg)   BMI 31.33 kg/m   Specialty Comments:  No specialty comments available.  PMFS History: Patient Active Problem List   Diagnosis Date Noted  . Amputated toe, right (HCC) 11/22/2018  . Type 2 diabetes mellitus with foot ulcer, without long-term current use of insulin (HCC) 11/22/2018  . Essential hypertension 11/22/2018  . Mixed hyperlipidemia 11/22/2018  . Morbid obesity (HCC) 11/22/2018  . Aneurysm of left popliteal artery (HCC) 11/22/2018  . Status post peripheral artery angioplasty with insertion of stent 11/22/2018  . Encounter for smoking cessation counseling 11/22/2018  . Osteomyelitis of third toe of right foot (HCC)   . Cellulitis of third toe of right foot    Past Medical History:  Diagnosis Date  . Diabetes mellitus without complication (HCC)   . Diabetic toe ulcer (HCC) 11/05/2018  . Hyperlipidemia   . Hypertension     Family History  Problem Relation Age of Onset  . COPD Mother   .  Cataracts Mother   . Brain cancer Father   . Hypertension Brother     Past Surgical History:  Procedure Laterality Date  . AMPUTATION Right 11/07/2018   Procedure: RIGHT 3RD TOE AMPUTATION;  Surgeon: Nadara Mustarduda, Veleka Djordjevic V, MD;  [facility];  Service: Orthopedics;  Laterality: Right;  . AMPUTATION TOE Left    big toe and one next to it  . CATARACT EXTRACTION Bilateral   . TOE AMPUTATION Left 2015   Social History   Occupational History  . Not on file  Tobacco Use  . Smoking status: Current Every Day Smoker    Packs/day: 1.00    Types: Cigarettes  . Smokeless tobacco: Never Used  Substance and Sexual Activity  . Alcohol use: Yes    Alcohol/week: 7.0 standard drinks    Types: 7 Shots of liquor per week    Comment: 7 per week  . Drug use: Not Currently  . Sexual activity: Yes

## 2019-04-26 ENCOUNTER — Ambulatory Visit (INDEPENDENT_AMBULATORY_CARE_PROVIDER_SITE_OTHER): Payer: BC Managed Care – PPO | Admitting: Orthopedic Surgery

## 2019-04-26 ENCOUNTER — Encounter: Payer: Self-pay | Admitting: Orthopedic Surgery

## 2019-04-26 VITALS — Ht 72.0 in | Wt 231.0 lb

## 2019-04-26 DIAGNOSIS — L03031 Cellulitis of right toe: Secondary | ICD-10-CM | POA: Diagnosis not present

## 2019-04-26 DIAGNOSIS — Z89421 Acquired absence of other right toe(s): Secondary | ICD-10-CM | POA: Diagnosis not present

## 2019-05-10 ENCOUNTER — Encounter: Payer: Self-pay | Admitting: Orthopedic Surgery

## 2019-05-10 ENCOUNTER — Ambulatory Visit (INDEPENDENT_AMBULATORY_CARE_PROVIDER_SITE_OTHER): Payer: BC Managed Care – PPO | Admitting: Orthopedic Surgery

## 2019-05-10 VITALS — Ht 72.0 in | Wt 231.0 lb

## 2019-05-10 DIAGNOSIS — Z89421 Acquired absence of other right toe(s): Secondary | ICD-10-CM | POA: Diagnosis not present

## 2019-05-10 DIAGNOSIS — L03031 Cellulitis of right toe: Secondary | ICD-10-CM

## 2019-05-10 NOTE — Progress Notes (Signed)
Office Visit Note   Patient: Anthony Park           Date of Birth: 09/14/1960           MRN: [id number] Visit Date: 04/26/2019              Requested by: Anthony CustardBoyce, Emily E, FNP [address] [place],  [place] [id number] PCP: Anthony CustardBoyce, Emily E, FNP  Chief Complaint  Patient presents with  . Right Foot - Pain, Follow-up      HPI: [name] who presents for 2 separate issues #1 status post amputation right third toe #2 cellulitis ulceration of the little toe.  Patient is currently on Cipro he is currently out of work secondary to the ulceration to the little toe.  Patient states he feels like his foot is continuing to heal.  Assessment & Plan: Visit Diagnoses:  1. Cellulitis of toe of right foot   2. Status post amputation of toe of right foot (HCC)     Plan: We will need to continue keeping the patient off his foot he will complete his antibiotics continue wound care recommended a stationary bike for improvement of the calf pump to both lower extremities.  Follow-Up Instructions: Return in about 2 weeks (around 05/10/2019).   Ortho Exam  Patient is alert, oriented, no adenopathy, well-dressed, normal affect, normal respiratory effort. Examination patient has a stable scab over the fifth toe dorsal lateral aspect which is approximately 10 x 20 mm.  There is a small abrasion medially to the little toe and plantarly.  There is no sausage digit swelling no purulent drainage patient continues to show improvement.  Imaging: No results found. No images are attached to the encounter.  Labs: Lab Results  Component Value Date   HGBA1C 6.7 (H) 11/05/2018     Lab Results  Component Value Date   ALBUMIN 3.5 11/06/2018    No results found for: MG No results found for: VD25OH  No results found for: PREALBUMIN CBC EXTENDED Latest Ref Rng & Units 11/06/2018 11/05/2018  WBC 4.0 - 10.5 K/uL 6.4 6.8  RBC 4.22 - 5.81 MIL/uL 4.70 5.26  HGB 13.0 - 17.0  g/dL 19.113.7 47.815.3  HCT 29.539.0 - 62.152.0 % 41.2 46.6  PLT 150 - 400 K/uL 161 170     Body mass index is 31.33 kg/m.  Orders:  No orders of the defined types were placed in this encounter.  No orders of the defined types were placed in this encounter.    Procedures: No procedures performed  Clinical Data: No additional findings.  ROS:  All other systems negative, except as noted in the HPI. Review of Systems  Objective: Vital Signs: Ht 6' (1.829 m)   Wt 231 lb (104.8 kg)   BMI 31.33 kg/m   Specialty Comments:  No specialty comments available.  PMFS History: Patient Active Problem List   Diagnosis Date Noted  . Amputated toe, right (HCC) 11/22/2018  . Type 2 diabetes mellitus with foot ulcer, without long-term current use of insulin (HCC) 11/22/2018  . Essential hypertension 11/22/2018  . Mixed hyperlipidemia 11/22/2018  . Morbid obesity (HCC) 11/22/2018  . Aneurysm of left popliteal artery (HCC) 11/22/2018  . Status post peripheral artery angioplasty with insertion of stent 11/22/2018  . Encounter for smoking cessation counseling 11/22/2018  . Osteomyelitis of third toe of right foot (HCC)   . Cellulitis of third toe of right foot    Past Medical History:  Diagnosis Date  . Diabetes mellitus without complication ([place])   . Diabetic toe ulcer ([place]) 11/05/2018  . Hyperlipidemia   . Hypertension     Family History  Problem Relation Age of Onset  . COPD Mother   . Cataracts Mother   . Brain cancer Father   . Hypertension Brother     Past Surgical History:  Procedure Laterality Date  . AMPUTATION Right 11/07/2018   Procedure: RIGHT 3RD TOE AMPUTATION;  Surgeon: Anthony Minion, MD;  Location: [facility];  Service: Orthopedics;  Laterality: Right;  . AMPUTATION TOE Left    big toe and one next to it  . CATARACT EXTRACTION Bilateral   . TOE AMPUTATION Left 2015   Social History   Occupational History  . Not on file  Tobacco Use  . Smoking status: Current Every Day  Smoker    Packs/day: 1.00    Types: Cigarettes  . Smokeless tobacco: Never Used  Substance and Sexual Activity  . Alcohol use: Yes    Alcohol/week: 7.0 standard drinks    Types: 7 Shots of liquor per week    Comment: 7 per week  . Drug use: Not Currently  . Sexual activity: Yes

## 2019-05-10 NOTE — Progress Notes (Signed)
Office Visit Note   Patient: Anthony Park           Date of Birth: Oct 08, 1960           MRN: [id number] Visit Date: 05/10/2019              Requested by: Doren CustardBoyce, Emily E, FNP [address] [place],  [place] [id number] PCP: Doren CustardBoyce, Emily E, FNP  Chief Complaint  Patient presents with  . Right Foot - Follow-up      HPI: [name] who presents in follow-up he status post the third toe amputation of the right foot and status post follow-up for cellulitis ulceration right foot little toe.  Patient states he feels like the ulcer is healing now skin around the scab is healthy he states he does have increased drainage if he is on his feet for more than 3 hours.  He was completing his course of Cipro and is using Neosporin.  Assessment & Plan: Visit Diagnoses:  1. Status post amputation of toe of right foot (HCC)   2. Cellulitis of toe of right foot     Plan: We will continue patient's out of work for at least 4 weeks follow-up in 2 weeks to reevaluate his foot.  Continue to minimize weightbearing continue with dressing changes with antibiotic ointment and complete his course of Cipro.  Follow-Up Instructions: Return in about 4 weeks (around 06/07/2019).   Ortho Exam  Patient is alert, oriented, no adenopathy, well-dressed, normal affect, normal respiratory effort. Examination there is no ascending cellulitis there is no sausage digit swelling of the fifth toe the amputated third toe is well-healed no cellulitis.  There is no sausage digit swelling the ulcer on the medial aspect of the little toe as well as the plantar aspect are essentially healed the scab is peeling off and there is good healthy granulation tissue around the edges no signs of abscess or deep infection  Imaging: No results found. No images are attached to the encounter.  Labs: Lab Results  Component Value Date   HGBA1C 6.7 (H) 11/05/2018     Lab Results  Component Value Date    ALBUMIN 3.5 11/06/2018    No results found for: MG No results found for: VD25OH  No results found for: PREALBUMIN CBC EXTENDED Latest Ref Rng & Units 11/06/2018 11/05/2018  WBC 4.0 - 10.5 K/uL 6.4 6.8  RBC 4.22 - 5.81 MIL/uL 4.70 5.26  HGB 13.0 - 17.0 g/dL 19.113.7 47.815.3  HCT 29.539.0 - 62.152.0 % 41.2 46.6  PLT 150 - 400 K/uL 161 170     Body mass index is 31.33 kg/m.  Orders:  No orders of the defined types were placed in this encounter.  No orders of the defined types were placed in this encounter.    Procedures: No procedures performed  Clinical Data: No additional findings.  ROS:  All other systems negative, except as noted in the HPI. Review of Systems  Objective: Vital Signs: Ht 6' (1.829 m)   Wt 231 lb (104.8 kg)   BMI 31.33 kg/m   Specialty Comments:  No specialty comments available.  PMFS History: Patient Active Problem List   Diagnosis Date Noted  . Amputated toe, right (HCC) 11/22/2018  . Type 2 diabetes mellitus with foot ulcer, without long-term current use of insulin (HCC) 11/22/2018  . Essential hypertension 11/22/2018  . Mixed hyperlipidemia 11/22/2018  . Morbid obesity (HCC) 11/22/2018  . Aneurysm of left popliteal artery (  [place]) 11/22/2018  . Status post peripheral artery angioplasty with insertion of stent 11/22/2018  . Encounter for smoking cessation counseling 11/22/2018  . Osteomyelitis of third toe of right foot ([place])   . Cellulitis of third toe of right foot    Past Medical History:  Diagnosis Date  . Diabetes mellitus without complication ([place])   . Diabetic toe ulcer ([place]) 11/05/2018  . Hyperlipidemia   . Hypertension     Family History  Problem Relation Age of Onset  . COPD Mother   . Cataracts Mother   . Brain cancer Father   . Hypertension Brother     Past Surgical History:  Procedure Laterality Date  . AMPUTATION Right 11/07/2018   Procedure: RIGHT 3RD TOE AMPUTATION;  Surgeon: Newt Minion, MD;  Location: [facility];  Service:  Orthopedics;  Laterality: Right;  . AMPUTATION TOE Left    big toe and one next to it  . CATARACT EXTRACTION Bilateral   . TOE AMPUTATION Left 2015   Social History   Occupational History  . Not on file  Tobacco Use  . Smoking status: Current Every Day Smoker    Packs/day: 1.00    Types: Cigarettes  . Smokeless tobacco: Never Used  Substance and Sexual Activity  . Alcohol use: Yes    Alcohol/week: 7.0 standard drinks    Types: 7 Shots of liquor per week    Comment: 7 per week  . Drug use: Not Currently  . Sexual activity: Yes

## 2019-05-23 ENCOUNTER — Other Ambulatory Visit: Payer: Self-pay

## 2019-05-23 ENCOUNTER — Ambulatory Visit: Payer: BC Managed Care – PPO | Admitting: Family Medicine

## 2019-05-24 ENCOUNTER — Ambulatory Visit: Payer: BC Managed Care – PPO | Admitting: Orthopedic Surgery

## 2019-05-25 ENCOUNTER — Encounter: Payer: Self-pay | Admitting: Family Medicine

## 2019-05-25 ENCOUNTER — Ambulatory Visit (INDEPENDENT_AMBULATORY_CARE_PROVIDER_SITE_OTHER): Payer: BC Managed Care – PPO | Admitting: Family Medicine

## 2019-05-25 ENCOUNTER — Other Ambulatory Visit: Payer: Self-pay

## 2019-05-25 VITALS — BP 148/86 | HR 89 | Temp 97.5°F | Resp 18 | Ht 72.0 in | Wt 218.1 lb

## 2019-05-25 DIAGNOSIS — S0921XA Traumatic rupture of right ear drum, initial encounter: Secondary | ICD-10-CM | POA: Diagnosis not present

## 2019-05-25 DIAGNOSIS — Z1389 Encounter for screening for other disorder: Secondary | ICD-10-CM

## 2019-05-25 DIAGNOSIS — H9221 Otorrhagia, right ear: Secondary | ICD-10-CM

## 2019-05-25 NOTE — Progress Notes (Signed)
Name: Anthony Park   MRN: [id number]    DOB: Sep 15, 1960   Date:05/25/2019       Progress Note  Subjective  Chief Complaint  Chief Complaint  Patient presents with  . Otalgia    bleeding in right ear  . Nevus    on chest that has changed colors    HPI  RIGHT Ear bleeding and otalgia: about 9 days ago he was cleaning his ears out with a curette and felt a "gush" and the qtip was soaked with blood.  Over the last week he has been rinsing the ear out with warm water and debrox.  He notes that his hearing has been back to normal now - did have some hearing disturbance for a few days.  Abnormal nevi: Has had 2 moles on the right chest for 6-9 months, notes these areas have gotten darker recently, though timeline is unclear. No pain or itching. Did have sunburns as an adult and child.  Has been out of work and has been doing more work on the deck recently.   Patient Active Problem List   Diagnosis Date Noted  . Amputated toe, right ([place]) 11/22/2018  . Type 2 diabetes mellitus with foot ulcer, without long-term current use of insulin ([place]) 11/22/2018  . Essential hypertension 11/22/2018  . Mixed hyperlipidemia 11/22/2018  . Morbid obesity ([place]) 11/22/2018  . Aneurysm of left popliteal artery ([place]) 11/22/2018  . Status post peripheral artery angioplasty with insertion of stent 11/22/2018  . Encounter for smoking cessation counseling 11/22/2018  . Osteomyelitis of third toe of right foot ([place])   . Cellulitis of third toe of right foot     Social History   Tobacco Use  . Smoking status: Current Every Day Smoker    Packs/day: 1.00    Types: Cigarettes  . Smokeless tobacco: Never Used  Substance Use Topics  . Alcohol use: Yes    Alcohol/week: 7.0 standard drinks    Types: 7 Shots of liquor per week    Comment: 7 per week     Current Outpatient Medications:  .  acetaminophen (TYLENOL) 325 MG tablet, Take 2 tablets (650 mg total) by mouth every 6 (six) hours as needed for  mild pain (or Fever >/= 101)., Disp: 20 tablet, Rfl: 0 .  amLODipine (NORVASC) 5 MG tablet, Take 1 tablet (5 mg total) by mouth daily., Disp: 90 tablet, Rfl: 3 .  aspirin EC 81 MG tablet, Take 1 tablet (81 mg total) by mouth daily., Disp: 30 tablet, Rfl: 0 .  lisinopril (PRINIVIL,ZESTRIL) 20 MG tablet, Take 1 tablet (20 mg total) by mouth daily., Disp: 90 tablet, Rfl: 1 .  mupirocin ointment (BACTROBAN) 2 %, Apply 1 application topically 2 (two) times daily. Apply to the affected area 2 times a day, Disp: 22 g, Rfl: 3 .  rosuvastatin (CRESTOR) 20 MG tablet, Take 1 tablet (20 mg total) by mouth daily at 6 PM., Disp: 90 tablet, Rfl: 1 .  ciprofloxacin (CIPRO) 500 MG tablet, Take 1 tablet (500 mg total) by mouth 2 (two) times daily. (Patient not taking: Reported on 05/25/2019), Disp: 28 tablet, Rfl: 0 .  doxycycline (VIBRA-TABS) 100 MG tablet, Take 1 tablet (100 mg total) by mouth 2 (two) times daily. (Patient not taking: Reported on 05/25/2019), Disp: 30 tablet, Rfl: 0 .  Multiple Vitamins-Minerals (MULTIVITAMIN WITH MINERALS) tablet, Take 1 tablet by mouth daily., Disp: , Rfl:   No Known Allergies  I personally reviewed active problem list, medication  list, allergies with the patient/caregiver today.  ROS  Constitutional: Negative for fever or weight change.  Respiratory: Negative for cough and shortness of breath.   Cardiovascular: Negative for chest pain or palpitations.  Gastrointestinal: Negative for abdominal pain, no bowel changes.  Musculoskeletal: Negative for gait problem or joint swelling.  Skin: Negative for rash. See HPI Neurological: Negative for dizziness or headache.  No other specific complaints in a complete review of systems (except as listed in HPI above).  Objective  Vitals:   05/25/19 1016  Pulse: 89  Resp: 18  Temp: (!) 97.5 F (36.4 C)  TempSrc: Oral  SpO2: 99%  Weight: 218 lb 1.6 oz (98.9 kg)  Height: 6' (1.829 m)    Body mass index is 29.58 kg/m.   Nursing Note and Vital Signs reviewed.  Physical Exam  Constitutional: Patient appears well-developed and well-nourished. No distress.  HENT: Head: Normocephalic and atraumatic. Ears: LEFT TM normal; RIGHT TM is not able to be visualized - there is first an obstruction of a dark sediment which is cleared with lavage, after lavage TM is not able to be visualized.  Eyes: Conjunctivae and EOM are normal. No scleral icterus.   Neck: Normal range of motion. Neck supple. No JVD present. No thyromegaly present.  Cardiovascular: Normal rate, regular rhythm and normal heart sounds.  No murmur heard. No BLE edema. Pulmonary/Chest: Effort normal and breath sounds normal. No respiratory distress. Musculoskeletal: Normal range of motion, no joint effusions. No gross deformities Neurological: Pt is alert and oriented to person, place, and time. No cranial nerve deficit. Coordination, balance, strength, speech and gait are normal.  Skin: Skin is warm and dry. There are two hyperpigmented flat lesions with irregular borders and a variety of shades of dark brown to flesh colored within the border to the right upper chest. Psychiatric: Patient has a normal mood and affect. behavior is normal. Judgment and thought content normal.  No results found for this or any previous visit (from the past 72 hour(s)).  Assessment & Plan  1. Acute traumatic puncture of tympanic membrane of right ear, initial encounter - Advised to monitor for 2 weeks; DO NOT put anything into ear canal.  If symptoms improve, will follow up with eval at routine follow up; if not improving will refer to ENT  2. Bleeding from right ear - Ear Lavage  3. Encounter for surveillance of abnormal nevi - Ambulatory referral to Dermatology

## 2019-05-31 ENCOUNTER — Ambulatory Visit (INDEPENDENT_AMBULATORY_CARE_PROVIDER_SITE_OTHER): Payer: BC Managed Care – PPO | Admitting: Orthopedic Surgery

## 2019-05-31 ENCOUNTER — Other Ambulatory Visit: Payer: Self-pay

## 2019-05-31 ENCOUNTER — Encounter: Payer: Self-pay | Admitting: Orthopedic Surgery

## 2019-05-31 VITALS — Ht 72.0 in | Wt 218.1 lb

## 2019-05-31 DIAGNOSIS — Z89421 Acquired absence of other right toe(s): Secondary | ICD-10-CM

## 2019-06-05 ENCOUNTER — Encounter: Payer: Self-pay | Admitting: Orthopedic Surgery

## 2019-06-05 NOTE — Progress Notes (Signed)
Office Visit Note   Patient: Anthony Park           Date of Birth: [birth year]           MRN: [id number] Visit Date: 05/31/2019              Requested by: Doren Custard, FNP [address] [place],  [place] [address] PCP: Doren Custard, FNP  Chief Complaint  Patient presents with  . Right Foot - Routine Post Op    11/07/2018 right 3rd toe amp      HPI: [name] who presents the status post right foot third toe amputation he has completed his course of Cipro he states he has an ulcer on the fifth toe.  He states he was up on his feet a lot yesterday.  Assessment & Plan: Visit Diagnoses:  1. Status post amputation of toe of right foot (HCC)     Plan: Patient will continue the antibiotic ointment he was given a note to extend his out of work for another 4 weeks.  Follow-Up Instructions: Return in about 4 weeks (around 06/28/2019).   Ortho Exam  Patient is alert, oriented, no adenopathy, well-dressed, normal affect, normal respiratory effort. Examination of the right foot there is a ulcer laterally over the fifth toe there is 100% granulation tissue with no bone exposed no cellulitis.  Patient will need to continue his antibiotic ointment.  Imaging: No results found. No images are attached to the encounter.  Labs: Lab Results  Component Value Date   HGBA1C 6.7 (H) 11/05/2018     Lab Results  Component Value Date   ALBUMIN 3.5 11/06/2018    No results found for: MG No results found for: VD25OH  No results found for: PREALBUMIN CBC EXTENDED Latest Ref Rng & Units 11/06/2018 11/05/2018  WBC 4.0 - 10.5 K/uL 6.4 6.8  RBC 4.22 - 5.81 MIL/uL 4.70 5.26  HGB 13.0 - 17.0 g/dL 72.5 36.6  HCT 44.0 - 34.7 % 41.2 46.6  PLT 150 - 400 K/uL 161 170     Body mass index is 29.58 kg/m.  Orders:  No orders of the defined types were placed in this encounter.  No orders of the defined types were placed in this encounter.    Procedures: No procedures performed  Clinical Data: No additional findings.  ROS:  All other systems negative, except as noted in the HPI. Review of Systems  Objective: Vital Signs: Ht 6' (1.829 m)   Wt 218 lb 1.6 oz (98.9 kg)   BMI 29.58 kg/m   Specialty Comments:  No specialty comments available.  PMFS History: Patient Active Problem List   Diagnosis Date Noted  . Amputated toe, right (HCC) 11/22/2018  . Type 2 diabetes mellitus with foot ulcer, without long-term current use of insulin (HCC) 11/22/2018  . Essential hypertension 11/22/2018  . Mixed hyperlipidemia 11/22/2018  . Morbid obesity (HCC) 11/22/2018  . Aneurysm of left popliteal artery (HCC) 11/22/2018  . Status post peripheral artery angioplasty with insertion of stent 11/22/2018  . Encounter for smoking cessation counseling 11/22/2018  . Osteomyelitis of third toe of right foot (HCC)   . Cellulitis of third toe of right foot    Past Medical History:  Diagnosis Date  . Diabetes mellitus without complication (HCC)   . Diabetic toe ulcer (HCC) 11/05/2018  . Hyperlipidemia   . Hypertension     Family History  Problem Relation Age of Onset  .  COPD Mother   . Cataracts Mother   . Brain cancer Father   . Hypertension Brother     Past Surgical History:  Procedure Laterality Date  . AMPUTATION Right 11/07/2018   Procedure: RIGHT 3RD TOE AMPUTATION;  Surgeon: Newt Minion, MD;  Location: [facility];  Service: Orthopedics;  Laterality: Right;  . AMPUTATION TOE Left    big toe and one next to it  . CATARACT EXTRACTION Bilateral   . TOE AMPUTATION Left 2015   Social History   Occupational History  . Not on file  Tobacco Use  . Smoking status: Current Every Day Smoker    Packs/day: 1.00    Types: Cigarettes  . Smokeless tobacco: Never Used  Substance and Sexual Activity  . Alcohol use: Yes    Alcohol/week: 7.0 standard drinks    Types: 7 Shots of liquor per week    Comment: 7 per week  . Drug use: Not  Currently  . Sexual activity: Yes

## 2019-06-28 ENCOUNTER — Encounter: Payer: Self-pay | Admitting: Orthopedic Surgery

## 2019-06-28 ENCOUNTER — Other Ambulatory Visit: Payer: Self-pay

## 2019-06-28 ENCOUNTER — Ambulatory Visit (INDEPENDENT_AMBULATORY_CARE_PROVIDER_SITE_OTHER): Payer: BC Managed Care – PPO | Admitting: Orthopedic Surgery

## 2019-06-28 VITALS — Ht 72.0 in | Wt 218.1 lb

## 2019-06-28 DIAGNOSIS — L97511 Non-pressure chronic ulcer of other part of right foot limited to breakdown of skin: Secondary | ICD-10-CM | POA: Diagnosis not present

## 2019-06-28 DIAGNOSIS — Z89421 Acquired absence of other right toe(s): Secondary | ICD-10-CM

## 2019-07-10 ENCOUNTER — Encounter: Payer: Self-pay | Admitting: Orthopedic Surgery

## 2019-07-10 NOTE — Progress Notes (Signed)
Office Visit Note   Patient: Anthony Park           Date of Birth: 12-Feb-1961           MRN: [id number] Visit Date: 06/28/2019              Requested by: Hubbard Hartshorn, [address] [place],  [place] [address] PCP: Hubbard Hartshorn, FNP  Chief Complaint  Patient presents with  . Right Foot - Follow-up      HPI: [name] who is status post right foot third toe amputation.  He has an ulcer on the fifth toe.  He is using Bactroban antibiotic ointment he states he has a little bit of drainage.  Patient states that he is still smoking.  Assessment & Plan: Visit Diagnoses:  1. Status post amputation of toe of right foot ([place])   2. Ulcer of toe, right, limited to breakdown of skin [facility])     Plan: Patient again was recommended to stop smoking.  With the persistent ulceration on the right foot patient cannot work and he was given a note to be out of work for 4 weeks reevaluate for return to work at follow-up.  Follow-Up Instructions: Return in about 4 weeks (around 07/26/2019).   Ortho Exam  Patient is alert, oriented, no adenopathy, well-dressed, normal affect, normal respiratory effort. Examination the third toe amputation site is healed well he has a Wagner grade 1 ulcer of the right foot fifth toe.  There is 100% healthy granulation tissue no exposed bone or tendon the ulcer is 10 x 15 mm and 1 mm deep.  Imaging: No results found. No images are attached to the encounter.  Labs: Lab Results  Component Value Date   HGBA1C 6.7 (H) 11/05/2018     Lab Results  Component Value Date   ALBUMIN 3.5 11/06/2018    No results found for: MG No results found for: VD25OH  No results found for: PREALBUMIN CBC EXTENDED Latest Ref Rng & Units 11/06/2018 11/05/2018  WBC 4.0 - 10.5 K/uL 6.4 6.8  RBC 4.22 - 5.81 MIL/uL 4.70 5.26  HGB 13.0 - 17.0 g/dL 13.7 15.3  HCT 39.0 - 52.0 % 41.2 46.6  PLT 150 - 400 K/uL 161 170     Body mass  index is 29.58 kg/m.  Orders:  No orders of the defined types were placed in this encounter.  No orders of the defined types were placed in this encounter.    Procedures: No procedures performed  Clinical Data: No additional findings.  ROS:  All other systems negative, except as noted in the HPI. Review of Systems  Objective: Vital Signs: Ht 6' (1.829 m)   Wt 218 lb 1.6 oz (98.9 kg)   BMI 29.58 kg/m   Specialty Comments:  No specialty comments available.  PMFS History: Patient Active Problem List   Diagnosis Date Noted  . Amputated toe, right ([place]) 11/22/2018  . Type 2 diabetes mellitus with foot ulcer, without long-term current use of insulin ([place]) 11/22/2018  . Essential hypertension 11/22/2018  . Mixed hyperlipidemia 11/22/2018  . Morbid obesity ([place]) 11/22/2018  . Aneurysm of left popliteal artery ([place]) 11/22/2018  . Status post peripheral artery angioplasty with insertion of stent 11/22/2018  . Encounter for smoking cessation counseling 11/22/2018  . Osteomyelitis of third toe of right foot ([place])   . Cellulitis of third toe of right foot    Past Medical History:  Diagnosis  Date  . Diabetes mellitus without complication (HCC)   . Diabetic toe ulcer (HCC) 11/05/2018  . Hyperlipidemia   . Hypertension     Family History  Problem Relation Age of Onset  . COPD Mother   . Cataracts Mother   . Brain cancer Father   . Hypertension Brother     Past Surgical History:  Procedure Laterality Date  . AMPUTATION Right 11/07/2018   Procedure: RIGHT 3RD TOE AMPUTATION;  Surgeon: Nadara Mustard, MD;  Location[facility];  Service: Orthopedics;  Laterality: Right;  . AMPUTATION TOE Left    big toe and one next to it  . CATARACT EXTRACTION Bilateral   . TOE AMPUTATION Left 2015   Social History   Occupational History  . Not on file  Tobacco Use  . Smoking status: Current Every Day Smoker    Packs/day: 1.00    Types: Cigarettes  . Smokeless tobacco: Never Used   Substance and Sexual Activity  . Alcohol use: Yes    Alcohol/week: 7.0 standard drinks    Types: 7 Shots of liquor per week    Comment: 7 per week  . Drug use: Not Currently  . Sexual activity: Yes

## 2019-07-20 ENCOUNTER — Other Ambulatory Visit: Payer: Self-pay | Admitting: Family Medicine

## 2019-07-26 ENCOUNTER — Encounter: Payer: Self-pay | Admitting: Orthopedic Surgery

## 2019-07-26 ENCOUNTER — Ambulatory Visit (INDEPENDENT_AMBULATORY_CARE_PROVIDER_SITE_OTHER): Payer: BC Managed Care – PPO | Admitting: Orthopedic Surgery

## 2019-07-26 ENCOUNTER — Other Ambulatory Visit: Payer: Self-pay

## 2019-07-26 VITALS — Ht 72.0 in | Wt 218.0 lb

## 2019-07-26 DIAGNOSIS — L97511 Non-pressure chronic ulcer of other part of right foot limited to breakdown of skin: Secondary | ICD-10-CM | POA: Diagnosis not present

## 2019-07-26 DIAGNOSIS — M79671 Pain in right foot: Secondary | ICD-10-CM

## 2019-07-26 NOTE — Progress Notes (Signed)
Office Visit Note   Patient: Anthony Park           Date of Birth: August 29, 1961           MRN: [id number] Visit Date: 07/26/2019              Requested by: Doren Custard, FNP [address] [place],  [place] [address] PCP: Doren Custard, FNP  Chief Complaint  Patient presents with  . Right Foot - Follow-up    11/07/18 right 3rd toe amputation  5th toe ulcer       HPI: Follow up for 5th toe ulcer. Feels he is doing better. Using neosporin and keeping it covered  Assessment & Plan: Visit Diagnoses: 5th toe ulcer  Plan: follow up 4 weeks  Follow-Up Instructions: 4 weeks  Ortho Exam  Patient is alert, oriented, no adenopathy, well-dressed, normal affect, normal respiratory effort. Lateral ulcer 5th toe. Toe warm and pink ulcer approx 3 cm in diameter. No drainage or foul odor  Imaging: No results found. No images are attached to the encounter.  Labs: Lab Results  Component Value Date   HGBA1C 6.7 (H) 11/05/2018     Lab Results  Component Value Date   ALBUMIN 3.5 11/06/2018    No results found for: MG No results found for: VD25OH  No results found for: PREALBUMIN CBC EXTENDED Latest Ref Rng & Units 11/06/2018 11/05/2018  WBC 4.0 - 10.5 K/uL 6.4 6.8  RBC 4.22 - 5.81 MIL/uL 4.70 5.26  HGB 13.0 - 17.0 g/dL 40.0 86.7  HCT 61.9 - 50.9 % 41.2 46.6  PLT 150 - 400 K/uL 161 170     Body mass index is 29.57 kg/m.  Orders:  No orders of the defined types were placed in this encounter.  No orders of the defined types were placed in this encounter.    Procedures: The surrounding callus was trimmed to healthy soft skin  Procedure Note  Patient: Anthony Park             Date of Birth: 19-Sep-1960           MRN: [id number]             Visit Date: 07/26/2019  Procedures: Visit Diagnoses: No diagnosis found.     Clinical Data: No additional findings.  ROS:  All other systems negative, except as noted in the HPI. Review of Systems   Objective: Vital Signs: Ht 6' (1.829 m)   Wt 218 lb (98.9 kg)   BMI 29.57 kg/m   Specialty Comments:  No specialty comments available.  PMFS History: Patient Active Problem List   Diagnosis Date Noted  . Amputated toe, right (HCC) 11/22/2018  . Type 2 diabetes mellitus with foot ulcer, without long-term current use of insulin (HCC) 11/22/2018  . Essential hypertension 11/22/2018  . Mixed hyperlipidemia 11/22/2018  . Morbid obesity (HCC) 11/22/2018  . Aneurysm of left popliteal artery (HCC) 11/22/2018  . Status post peripheral artery angioplasty with insertion of stent 11/22/2018  . Encounter for smoking cessation counseling 11/22/2018  . Osteomyelitis of third toe of right foot (HCC)   . Cellulitis of third toe of right foot    Past Medical History:  Diagnosis Date  . Diabetes mellitus without complication (HCC)   . Diabetic toe ulcer (HCC) 11/05/2018  . Hyperlipidemia   . Hypertension     Family History  Problem Relation Age of Onset  . COPD Mother   . Cataracts Mother   .  Brain cancer Father   . Hypertension Brother     Past Surgical History:  Procedure Laterality Date  . AMPUTATION Right 11/07/2018   Procedure: RIGHT 3RD TOE AMPUTATION;  Surgeon: Newt Minion, MD;  Location: [facility];  Service: Orthopedics;  Laterality: Right;  . AMPUTATION TOE Left    big toe and one next to it  . CATARACT EXTRACTION Bilateral   . TOE AMPUTATION Left 2015   Social History   Occupational History  . Not on file  Tobacco Use  . Smoking status: Current Every Day Smoker    Packs/day: 1.00    Types: Cigarettes  . Smokeless tobacco: Never Used  Substance and Sexual Activity  . Alcohol use: Yes    Alcohol/week: 7.0 standard drinks    Types: 7 Shots of liquor per week    Comment: 7 per week  . Drug use: Not Currently  . Sexual activity: Yes

## 2019-08-23 ENCOUNTER — Encounter: Payer: Self-pay | Admitting: Orthopedic Surgery

## 2019-08-23 ENCOUNTER — Other Ambulatory Visit: Payer: Self-pay

## 2019-08-23 ENCOUNTER — Ambulatory Visit (INDEPENDENT_AMBULATORY_CARE_PROVIDER_SITE_OTHER): Payer: BC Managed Care – PPO | Admitting: Orthopedic Surgery

## 2019-08-23 VITALS — Ht 72.0 in | Wt 218.0 lb

## 2019-08-23 DIAGNOSIS — Z89421 Acquired absence of other right toe(s): Secondary | ICD-10-CM

## 2019-08-24 ENCOUNTER — Encounter: Payer: Self-pay | Admitting: Orthopedic Surgery

## 2019-08-24 NOTE — Progress Notes (Signed)
Office Visit Note   Patient: Anthony Park           Date of Birth: Feb 04, 1961           MRN: [id number] Visit Date: 08/23/2019              Requested by: Hubbard Hartshorn, [address] [place],  [place] [address] PCP: Hubbard Hartshorn, FNP  Chief Complaint  Patient presents with  . Right Foot - Follow-up    11/07/18 right 3rd toe amputation and 5th toe ulcer follow up       HPI: [name] who presents follow-up status post right foot third ray amputation with ulceration over the fifth toe.  Patient feels like he is doing much better he is currently full weightbearing in regular shoes.  Assessment & Plan: Visit Diagnoses:  1. Status post amputation of toe of right foot [facility])     Plan: Patient is given a note that he may return to work on 12/14 increase activities as tolerated.  Follow-Up Instructions: Return if symptoms worsen or fail to improve.   Ortho Exam  Patient is alert, oriented, no adenopathy, well-dressed, normal affect, normal respiratory effort. Examination patient surgical incision is well-healed the lateral ulcer has healed his foot is pain-free but he states he has difficulty with prolonged standing.  Imaging: No results found. No images are attached to the encounter.  Labs: Lab Results  Component Value Date   HGBA1C 6.7 (H) 11/05/2018     Lab Results  Component Value Date   ALBUMIN 3.5 11/06/2018    No results found for: MG No results found for: VD25OH  No results found for: PREALBUMIN CBC EXTENDED Latest Ref Rng & Units 11/06/2018 11/05/2018  WBC 4.0 - 10.5 K/uL 6.4 6.8  RBC 4.22 - 5.81 MIL/uL 4.70 5.26  HGB 13.0 - 17.0 g/dL 13.7 15.3  HCT 39.0 - 52.0 % 41.2 46.6  PLT 150 - 400 K/uL 161 170     Body mass index is 29.57 kg/m.  Orders:  No orders of the defined types were placed in this encounter.  No orders of the defined types were placed in this encounter.    Procedures: No procedures  performed  Clinical Data: No additional findings.  ROS:  All other systems negative, except as noted in the HPI. Review of Systems  Objective: Vital Signs: Ht 6' (1.829 m)   Wt 218 lb (98.9 kg)   BMI 29.57 kg/m   Specialty Comments:  No specialty comments available.  PMFS History: Patient Active Problem List   Diagnosis Date Noted  . Amputated toe, right ([place]) 11/22/2018  . Type 2 diabetes mellitus with foot ulcer, without long-term current use of insulin ([place]) 11/22/2018  . Essential hypertension 11/22/2018  . Mixed hyperlipidemia 11/22/2018  . Morbid obesity ([place]) 11/22/2018  . Aneurysm of left popliteal artery ([place]) 11/22/2018  . Status post peripheral artery angioplasty with insertion of stent 11/22/2018  . Encounter for smoking cessation counseling 11/22/2018  . Osteomyelitis of third toe of right foot ([place])   . Cellulitis of third toe of right foot    Past Medical History:  Diagnosis Date  . Diabetes mellitus without complication ([place])   . Diabetic toe ulcer ([place]) 11/05/2018  . Hyperlipidemia   . Hypertension     Family History  Problem Relation Age of Onset  . COPD Mother   . Cataracts Mother   . Brain cancer Father   .  Hypertension Brother     Past Surgical History:  Procedure Laterality Date  . AMPUTATION Right 11/07/2018   Procedure: RIGHT 3RD TOE AMPUTATION;  Surgeon: Nadara Mustard, MD;  Location: [facility];  Service: Orthopedics;  Laterality: Right;  . AMPUTATION TOE Left    big toe and one next to it  . CATARACT EXTRACTION Bilateral   . TOE AMPUTATION Left 2015   Social History   Occupational History  . Not on file  Tobacco Use  . Smoking status: Current Every Day Smoker    Packs/day: 1.00    Types: Cigarettes  . Smokeless tobacco: Never Used  Substance and Sexual Activity  . Alcohol use: Yes    Alcohol/week: 7.0 standard drinks    Types: 7 Shots of liquor per week    Comment: 7 per week  . Drug use: Not Currently  . Sexual activity: Yes

## 2019-09-03 DIAGNOSIS — D3131 Benign neoplasm of right choroid: Secondary | ICD-10-CM | POA: Diagnosis not present

## 2019-09-03 DIAGNOSIS — H35033 Hypertensive retinopathy, bilateral: Secondary | ICD-10-CM | POA: Diagnosis not present

## 2019-09-03 DIAGNOSIS — H35371 Puckering of macula, right eye: Secondary | ICD-10-CM | POA: Diagnosis not present

## 2019-09-03 DIAGNOSIS — H43823 Vitreomacular adhesion, bilateral: Secondary | ICD-10-CM | POA: Diagnosis not present

## 2019-09-05 ENCOUNTER — Encounter: Payer: Self-pay | Admitting: Physician Assistant

## 2019-09-05 ENCOUNTER — Ambulatory Visit (INDEPENDENT_AMBULATORY_CARE_PROVIDER_SITE_OTHER): Payer: BC Managed Care – PPO | Admitting: Physician Assistant

## 2019-09-05 ENCOUNTER — Other Ambulatory Visit: Payer: Self-pay

## 2019-09-05 ENCOUNTER — Ambulatory Visit: Payer: Self-pay

## 2019-09-05 VITALS — Ht 72.0 in | Wt 218.0 lb

## 2019-09-05 DIAGNOSIS — M25552 Pain in left hip: Secondary | ICD-10-CM | POA: Diagnosis not present

## 2019-09-05 MED ORDER — PREDNISONE 10 MG PO TABS: 10.0000 mg | ORAL_TABLET | Freq: Every day | ORAL | 1 refills | Status: DC

## 2019-09-05 NOTE — Progress Notes (Signed)
Office Visit Note   Patient: Anthony Park           Date of Birth: 1961/01/27           MRN: [id number] Visit Date: 09/05/2019              Requested by: Doren Custard, FNP [address] [place],  [place] [address] PCP: Doren Custard, FNP  Chief Complaint  Patient presents with  . Left Hip - Pain      HPI: This is a pleasant gentleman with a 1 to 2-week history of left hip pain.  He denies any injury.  He has a job where he has been on his feet and he has had to shorten his hours he has had relief with Aleve he also reports some popping in the hip his pain is more in the posterior hip but does radiate to the groin  Assessment & Plan: Visit Diagnoses:  1. Pain in left hip     Plan: We will try a course of prednisone he is to take 1 with food daily he is not to take any other anti-inflammatories at the same time.  My feeling is that his pain is probably from the hip in nature but cannot rule out a sciatic cause  Follow-Up Instructions: No follow-ups on file.   Ortho Exam  Patient is alert, oriented, no adenopathy, well-dressed, normal affect, normal respiratory effort. Left hip: Distal CMS intact no swelling no cellulitis he has almost no internal or external rotation he is able to do a straight leg raise strength is equal bilaterally  Imaging: No results found. No images are attached to the encounter.  Labs: Lab Results  Component Value Date   HGBA1C 6.7 (H) 11/05/2018     Lab Results  Component Value Date   ALBUMIN 3.5 11/06/2018    No results found for: MG No results found for: VD25OH  No results found for: PREALBUMIN CBC EXTENDED Latest Ref Rng & Units 11/06/2018 11/05/2018  WBC 4.0 - 10.5 K/uL 6.4 6.8  RBC 4.22 - 5.81 MIL/uL 4.70 5.26  HGB 13.0 - 17.0 g/dL 11.9 41.7  HCT 40.8 - 14.4 % 41.2 46.6  PLT 150 - 400 K/uL 161 170     Body mass index is 29.57 kg/m.  Orders:  Orders Placed This Encounter  Procedures  . XR HIP UNILAT W OR  W/O PELVIS 2-3 VIEWS LEFT   Meds ordered this encounter  Medications  . predniSONE (DELTASONE) 10 MG tablet    Sig: Take 1 tablet (10 mg total) by mouth daily with breakfast.    Dispense:  30 tablet    Refill:  1     Procedures: No procedures performed  Clinical Data: No additional findings.  ROS:  All other systems negative, except as noted in the HPI. Review of Systems  Objective: Vital Signs: Ht 6' (1.829 m)   Wt 218 lb (98.9 kg)   BMI 29.57 kg/m   Specialty Comments:  No specialty comments available.  PMFS History: Patient Active Problem List   Diagnosis Date Noted  . Amputated toe, right (HCC) 11/22/2018  . Type 2 diabetes mellitus with foot ulcer, without long-term current use of insulin (HCC) 11/22/2018  . Essential hypertension 11/22/2018  . Mixed hyperlipidemia 11/22/2018  . Morbid obesity (HCC) 11/22/2018  . Aneurysm of left popliteal artery (HCC) 11/22/2018  . Status post peripheral artery angioplasty with insertion of stent 11/22/2018  . Encounter for smoking cessation  counseling 11/22/2018  . Osteomyelitis of third toe of right foot ([place])   . Cellulitis of third toe of right foot    Past Medical History:  Diagnosis Date  . Diabetes mellitus without complication ([place])   . Diabetic toe ulcer ([place]) 11/05/2018  . Hyperlipidemia   . Hypertension     Family History  Problem Relation Age of Onset  . COPD Mother   . Cataracts Mother   . Brain cancer Father   . Hypertension Brother     Past Surgical History:  Procedure Laterality Date  . AMPUTATION Right 11/07/2018   Procedure: RIGHT 3RD TOE AMPUTATION;  Surgeon: Newt Minion, MD;  Location: [facility];  Service: Orthopedics;  Laterality: Right;  . AMPUTATION TOE Left    big toe and one next to it  . CATARACT EXTRACTION Bilateral   . TOE AMPUTATION Left 2015   Social History   Occupational History  . Not on file  Tobacco Use  . Smoking status: Current Every Day Smoker    Packs/day: 1.00     Types: Cigarettes  . Smokeless tobacco: Never Used  Substance and Sexual Activity  . Alcohol use: Yes    Alcohol/week: 7.0 standard drinks    Types: 7 Shots of liquor per week    Comment: 7 per week  . Drug use: Not Currently  . Sexual activity: Yes

## 2019-09-24 ENCOUNTER — Ambulatory Visit: Payer: BC Managed Care – PPO | Admitting: Orthopedic Surgery

## 2019-09-25 ENCOUNTER — Telehealth: Payer: Self-pay | Admitting: Emergency Medicine

## 2019-09-25 NOTE — Telephone Encounter (Signed)
Pt is overdue for a routine follow up.  He needs to schedule an appointment to discuss his chronic medical conditions.  Will review office notes from retina specialty and follow recommendations once able to review.

## 2019-09-25 NOTE — Telephone Encounter (Signed)
Copied from CRM [id number]. Topic: General - Other >> Sep 24, 2019  4:10 PM Gwenlyn Fudge wrote: Reason for CRM: Leta Jungling, from [facility] retina specialist, called and is needing to place a referral for their office. Please advise.

## 2019-09-25 NOTE — Telephone Encounter (Signed)
Patient was seen on 12/21 and they stated he needed a carotid doppler and echocardiogram. Left eye had plaque built up and hypertension retinopathy. Asked office to fax over patient last ov note

## 2019-09-27 ENCOUNTER — Encounter: Payer: Self-pay | Admitting: Orthopedic Surgery

## 2019-09-27 ENCOUNTER — Ambulatory Visit (INDEPENDENT_AMBULATORY_CARE_PROVIDER_SITE_OTHER): Payer: BC Managed Care – PPO | Admitting: Orthopedic Surgery

## 2019-09-27 ENCOUNTER — Other Ambulatory Visit: Payer: Self-pay

## 2019-09-27 VITALS — Ht 72.0 in | Wt 218.0 lb

## 2019-09-27 DIAGNOSIS — M25559 Pain in unspecified hip: Secondary | ICD-10-CM

## 2019-09-27 NOTE — Telephone Encounter (Signed)
Patient notified and will make appointment.

## 2019-09-27 NOTE — Progress Notes (Signed)
Office Visit Note   Patient: Anthony Park           Date of Birth: Feb 23, 1961           MRN: [id number] Visit Date: 09/27/2019              Requested by: Doren Custard, FNP [address] [place],  [place] [address] PCP: Doren Custard, FNP  Chief Complaint  Patient presents with  . Left Hip - Pain, Follow-up      HPI This is a pleasant gentleman who follows up today for his left hip pain.  He has been on a 3-week course of prednisone daily.  He does think this is helped.  He states he is a little stiff in both his hips today but he admits he was climbing up on a ladder yesterday  Assessment & Plan: Visit Diagnoses: No diagnosis found.  Plan: We discussed slowly weaning off the prednisone over the next week and see how he does.  He will follow up in 1 month  Follow-Up Instructions: No follow-ups on file.   Ortho Exam  Patient is alert, oriented, no adenopathy, well-dressed, normal affect, normal respiratory effort. Focused examination of his hip demonstrates good range of motion with some mild discomfort.  No swelling no cellulitis  Imaging: No results found. No images are attached to the encounter.  Labs: Lab Results  Component Value Date   HGBA1C 6.7 (H) 11/05/2018     Lab Results  Component Value Date   ALBUMIN 3.5 11/06/2018    No results found for: MG No results found for: VD25OH  No results found for: PREALBUMIN CBC EXTENDED Latest Ref Rng & Units 11/06/2018 11/05/2018  WBC 4.0 - 10.5 K/uL 6.4 6.8  RBC 4.22 - 5.81 MIL/uL 4.70 5.26  HGB 13.0 - 17.0 g/dL 35.5 73.2  HCT 20.2 - 54.2 % 41.2 46.6  PLT 150 - 400 K/uL 161 170     Body mass index is 29.57 kg/m.  Orders:  No orders of the defined types were placed in this encounter.  No orders of the defined types were placed in this encounter.    Procedures: No procedures performed  Clinical Data: No additional findings.  ROS:  All other systems negative, except as noted in the  HPI. Review of Systems  Objective: Vital Signs: Ht 6' (1.829 m)   Wt 218 lb (98.9 kg)   BMI 29.57 kg/m   Specialty Comments:  No specialty comments available.  PMFS History: Patient Active Problem List   Diagnosis Date Noted  . Amputated toe, right (HCC) 11/22/2018  . Type 2 diabetes mellitus with foot ulcer, without long-term current use of insulin (HCC) 11/22/2018  . Essential hypertension 11/22/2018  . Mixed hyperlipidemia 11/22/2018  . Morbid obesity (HCC) 11/22/2018  . Aneurysm of left popliteal artery (HCC) 11/22/2018  . Status post peripheral artery angioplasty with insertion of stent 11/22/2018  . Encounter for smoking cessation counseling 11/22/2018  . Osteomyelitis of third toe of right foot (HCC)   . Cellulitis of third toe of right foot    Past Medical History:  Diagnosis Date  . Diabetes mellitus without complication (HCC)   . Diabetic toe ulcer (HCC) 11/05/2018  . Hyperlipidemia   . Hypertension     Family History  Problem Relation Age of Onset  . COPD Mother   . Cataracts Mother   . Brain cancer Father   . Hypertension Brother     Past Surgical  History:  Procedure Laterality Date  . AMPUTATION Right 11/07/2018   Procedure: RIGHT 3RD TOE AMPUTATION;  Surgeon: Newt Minion, MD;  Location: [facility];  Service: Orthopedics;  Laterality: Right;  . AMPUTATION TOE Left    big toe and one next to it  . CATARACT EXTRACTION Bilateral   . TOE AMPUTATION Left 2015   Social History   Occupational History  . Not on file  Tobacco Use  . Smoking status: Current Every Day Smoker    Packs/day: 1.00    Types: Cigarettes  . Smokeless tobacco: Never Used  Substance and Sexual Activity  . Alcohol use: Yes    Alcohol/week: 7.0 standard drinks    Types: 7 Shots of liquor per week    Comment: 7 per week  . Drug use: Not Currently  . Sexual activity: Yes

## 2019-09-28 ENCOUNTER — Telehealth: Payer: Self-pay | Admitting: Family Medicine

## 2019-09-28 NOTE — Telephone Encounter (Signed)
This patient needs follow up ASAP to address abnormal ophthalmic examination and need for additional testing as well as for routine follow up.  Please place with Dr. Carlynn Purl next week.

## 2019-09-28 NOTE — Telephone Encounter (Signed)
lvm on cell number for pt to schedule appt with Dr Carlynn Purl next week per Irving Burton request

## 2020-01-22 ENCOUNTER — Encounter: Payer: Self-pay | Admitting: Orthopedic Surgery

## 2020-01-22 ENCOUNTER — Ambulatory Visit (INDEPENDENT_AMBULATORY_CARE_PROVIDER_SITE_OTHER): Payer: BC Managed Care – PPO | Admitting: Physician Assistant

## 2020-01-22 ENCOUNTER — Ambulatory Visit: Payer: Self-pay

## 2020-01-22 ENCOUNTER — Other Ambulatory Visit: Payer: Self-pay

## 2020-01-22 VITALS — Ht 72.0 in | Wt 218.0 lb

## 2020-01-22 DIAGNOSIS — M5442 Lumbago with sciatica, left side: Secondary | ICD-10-CM

## 2020-01-22 DIAGNOSIS — I714 Abdominal aortic aneurysm, without rupture, unspecified: Secondary | ICD-10-CM

## 2020-01-22 NOTE — Progress Notes (Signed)
Office Visit Note   Patient: Anthony Park           Date of Birth: 1961/05/07           MRN: [id number] Visit Date: 01/22/2020              Requested by: Hubbard Hartshorn, [address] [place],  [place] [address] PCP: Hubbard Hartshorn, FNP  Chief Complaint  Patient presents with  . Left Hip - Follow-up  . Lower Back - Follow-up      HPI: This is a pleasant 59 year old gentleman who I saw in January for left hip pain.  Findings on x-ray of his hip were consistent with advanced arthritis and he did go through a course of prednisone.  He has had return of symptoms but they are slightly different he has pain in his buttocks mostly on the left sometimes on the right that can radiate down his into his feet and he does sometimes get some associated tingling.  He denies any weakness.  Assessment & Plan: Visit Diagnoses:  1. Acute left-sided low back pain with left-sided sciatica   2. Abdominal aortic aneurysm (AAA) without rupture (HCC)     Plan: I discussed the natural history of his aortic aneurysm with him.  He does recall that years ago he was supposed to get a study of the vessels in his abdomen but deferred this.  We have given an urgent referral to vascular for evaluation.  He understands in the meantime if he has any sharp back or abdominal pain if he gets any dizziness he is to be taken to the nearest emergency room.  I have taken him off work till evaluation and recommendations are made by vascular.  Once this is completed we could revisit possibly doing epidural steroid injections or hip injection  Follow-Up Instructions: No follow-ups on file.   Ortho Exam  Patient is alert, oriented, no adenopathy, well-dressed, normal affect, normal respiratory effort. He is ambulating with a cane.  He does have decreased motion with internal and external rotation of his left hip.  His pain today is mostly focused in his buttocks.  Pain does radiate down into his feet good  dorsiflexion plantarflexion strength today.  Imaging: No results found. No images are attached to the encounter.  Labs: Lab Results  Component Value Date   HGBA1C 6.7 (H) 11/05/2018     Lab Results  Component Value Date   ALBUMIN 3.5 11/06/2018    No results found for: MG No results found for: VD25OH  No results found for: PREALBUMIN CBC EXTENDED Latest Ref Rng & Units 11/06/2018 11/05/2018  WBC 4.0 - 10.5 K/uL 6.4 6.8  RBC 4.22 - 5.81 MIL/uL 4.70 5.26  HGB 13.0 - 17.0 g/dL 13.7 15.3  HCT 39.0 - 52.0 % 41.2 46.6  PLT 150 - 400 K/uL 161 170     Body mass index is 29.57 kg/m.  Orders:  Orders Placed This Encounter  Procedures  . XR Lumbar Spine 2-3 Views  . US ABDOMINAL PELVIC ART/VENT FLOW DOPPLER   No orders of the defined types were placed in this encounter.    Procedures: No procedures performed  Clinical Data: No additional findings.  ROS:  All other systems negative, except as noted in the HPI. Review of Systems  Objective: Vital Signs: Ht 6' (1.829 m)   Wt 218 lb (98.9 kg)   BMI 29.57 kg/m   Specialty Comments:  No specialty comments available.  PMFS History: Patient Active Problem List   Diagnosis Date Noted  . Amputated toe, right (HCC) 11/22/2018  . Type 2 diabetes mellitus with foot ulcer, without long-term current use of insulin (HCC) 11/22/2018  . Essential hypertension 11/22/2018  . Mixed hyperlipidemia 11/22/2018  . Morbid obesity (HCC) 11/22/2018  . Aneurysm of left popliteal artery (HCC) 11/22/2018  . Status post peripheral artery angioplasty with insertion of stent 11/22/2018  . Encounter for smoking cessation counseling 11/22/2018  . Osteomyelitis of third toe of right foot (HCC)   . Cellulitis of third toe of right foot    Past Medical History:  Diagnosis Date  . Diabetes mellitus without complication (HCC)   . Diabetic toe ulcer (HCC) 11/05/2018  . Hyperlipidemia   . Hypertension     Family History  Problem Relation  Age of Onset  . COPD Mother   . Cataracts Mother   . Brain cancer Father   . Hypertension Brother     Past Surgical History:  Procedure Laterality Date  . AMPUTATION Right 11/07/2018   Procedure: RIGHT 3RD TOE AMPUTATION;  Surgeon: Nadara Mustard, MD;  Location: [facility];  Service: Orthopedics;  Laterality: Right;  . AMPUTATION TOE Left    big toe and one next to it  . CATARACT EXTRACTION Bilateral   . TOE AMPUTATION Left 2015   Social History   Occupational History  . Not on file  Tobacco Use  . Smoking status: Current Every Day Smoker    Packs/day: 1.00    Types: Cigarettes  . Smokeless tobacco: Never Used  Substance and Sexual Activity  . Alcohol use: Yes    Alcohol/week: 7.0 standard drinks    Types: 7 Shots of liquor per week    Comment: 7 per week  . Drug use: Not Currently  . Sexual activity: Yes

## 2020-01-23 ENCOUNTER — Telehealth: Payer: Self-pay | Admitting: *Deleted

## 2020-01-23 NOTE — Telephone Encounter (Signed)
Pt has appt scheduled on Mon May 17th at 930am at [facility], pt is to arrive at 915am, NPO 6 hrs before.. Left vm with pt to contact me with appt. Information.

## 2020-01-28 ENCOUNTER — Other Ambulatory Visit: Payer: Self-pay

## 2020-01-28 ENCOUNTER — Ambulatory Visit (HOSPITAL_COMMUNITY)
Admission: RE | Admit: 2020-01-28 | Discharge: 2020-01-28 | Disposition: A | Discharge status: Home / Self Care | Payer: BC Managed Care – PPO | Source: Ambulatory Visit | Attending: Orthopedic Surgery | Admitting: Orthopedic Surgery

## 2020-01-28 DIAGNOSIS — I714 Abdominal aortic aneurysm, without rupture, unspecified: Secondary | ICD-10-CM

## 2020-01-28 NOTE — Telephone Encounter (Signed)
Study done

## 2020-01-29 ENCOUNTER — Other Ambulatory Visit: Payer: Self-pay

## 2020-01-29 ENCOUNTER — Telehealth: Payer: Self-pay | Admitting: Physician Assistant

## 2020-01-29 DIAGNOSIS — M5442 Lumbago with sciatica, left side: Secondary | ICD-10-CM

## 2020-01-29 MED ORDER — PREDNISONE 10 MG PO TABS
ORAL_TABLET | ORAL | 0 refills | Status: DC
Start: 2020-01-29 — End: 2020-02-19

## 2020-01-29 NOTE — Telephone Encounter (Signed)
I called and lm on vm to advise that vascular wants the pt to follow up in 1 year for repeat ultrasound to compare to study he just had. They should call to schedule this. Also Dr. Lajoyce Corners sent an rx for Prednisone 20 mg a day to the [facility] on  [address] and also ordered an MRI of the L spine will obtain authorization for this and call to offer an appt. To call with any questions.

## 2020-01-29 NOTE — Telephone Encounter (Signed)
Patient called.   The results are in from the vascular doctor that he was referred to. Wants to know his next step.  Call back: [phone]

## 2020-02-17 ENCOUNTER — Ambulatory Visit
Admission: RE | Admit: 2020-02-17 | Discharge: 2020-02-17 | Disposition: A | Discharge status: Home / Self Care | Payer: BC Managed Care – PPO | Source: Ambulatory Visit | Attending: Orthopedic Surgery | Admitting: Orthopedic Surgery

## 2020-02-17 DIAGNOSIS — M5442 Lumbago with sciatica, left side: Secondary | ICD-10-CM

## 2020-02-17 DIAGNOSIS — M48061 Spinal stenosis, lumbar region without neurogenic claudication: Secondary | ICD-10-CM | POA: Diagnosis not present

## 2020-02-18 ENCOUNTER — Other Ambulatory Visit: Payer: Self-pay

## 2020-02-18 DIAGNOSIS — M5442 Lumbago with sciatica, left side: Secondary | ICD-10-CM

## 2020-02-19 ENCOUNTER — Other Ambulatory Visit: Payer: Self-pay | Admitting: Orthopedic Surgery

## 2020-02-19 ENCOUNTER — Telehealth: Payer: Self-pay | Admitting: Orthopedic Surgery

## 2020-02-19 NOTE — Telephone Encounter (Signed)
Made referral for pt to see Dr. Alvester Morin for ESi injections lumbar spine. Do you want to also refill his prednisone?

## 2020-02-19 NOTE — Telephone Encounter (Signed)
I called pt and he wanted to know when he was going to hear back from sch his ESI. I advised that this is something that will require approval from his insurance company and once this has been obtained that the scheduler will call with dates and times available. Pt voiced understanding and will call with any questions.

## 2020-02-19 NOTE — Telephone Encounter (Signed)
Pt called stating he's on a time sensitive schedule and would like to see if he could get a call back by 02/20/20?  [phone]

## 2020-02-21 ENCOUNTER — Ambulatory Visit: Payer: BC Managed Care – PPO | Admitting: Orthopedic Surgery

## 2020-03-05 ENCOUNTER — Other Ambulatory Visit: Payer: BC Managed Care – PPO

## 2020-03-12 ENCOUNTER — Other Ambulatory Visit: Payer: Self-pay | Admitting: Orthopedic Surgery

## 2020-03-12 NOTE — Telephone Encounter (Signed)
Do you want to refill? 

## 2020-03-24 ENCOUNTER — Encounter: Payer: Self-pay | Admitting: Physical Medicine and Rehabilitation

## 2020-03-24 ENCOUNTER — Other Ambulatory Visit: Payer: Self-pay

## 2020-03-24 ENCOUNTER — Ambulatory Visit: Payer: Self-pay

## 2020-03-24 ENCOUNTER — Telehealth: Payer: Self-pay | Admitting: Physical Medicine and Rehabilitation

## 2020-03-24 ENCOUNTER — Ambulatory Visit (INDEPENDENT_AMBULATORY_CARE_PROVIDER_SITE_OTHER): Payer: BC Managed Care – PPO | Admitting: Physical Medicine and Rehabilitation

## 2020-03-24 ENCOUNTER — Encounter: Payer: Self-pay | Admitting: Orthopedic Surgery

## 2020-03-24 VITALS — BP 198/122 | HR 86

## 2020-03-24 DIAGNOSIS — M47816 Spondylosis without myelopathy or radiculopathy, lumbar region: Secondary | ICD-10-CM

## 2020-03-24 DIAGNOSIS — M48062 Spinal stenosis, lumbar region with neurogenic claudication: Secondary | ICD-10-CM | POA: Diagnosis not present

## 2020-03-24 DIAGNOSIS — M5416 Radiculopathy, lumbar region: Secondary | ICD-10-CM | POA: Diagnosis not present

## 2020-03-24 MED ORDER — METHYLPREDNISOLONE ACETATE 80 MG/ML IJ SUSP
40.0000 mg | Freq: Once | INTRAMUSCULAR | Status: AC
  Administered 2020-03-24: 40 mg

## 2020-03-24 NOTE — Progress Notes (Signed)
Pt states pain is in the lower back with pain sometimes in the left knee. When laying flat pt states he is unable to lift up his left leg. Pt states sitting too long increases pain. Pt states walking and doing core exercises helps pain.   Numeric Pain Rating Scale and Functional Assessment Average Pain 5   In the last MONTH (on 0-10 scale) has pain interfered with the following?  1. General activity like being  able to carry out your everyday physical activities such as walking, climbing stairs, carrying groceries, or moving a chair?  Rating(1)   +Driver, -BT, -Dye Allergies.

## 2020-03-24 NOTE — Telephone Encounter (Signed)
Please advise 

## 2020-03-24 NOTE — Telephone Encounter (Signed)
Please see messages below. Please advise. 

## 2020-03-24 NOTE — Procedures (Signed)
Lumbosacral Transforaminal Epidural Steroid Injection - Sub-Pedicular Approach with Fluoroscopic Guidance  Patient: Anthony Park      Date of Birth: 08-22-1961 MRN: [id number] PCP: Doren Custard, FNP      Visit Date: 03/24/2020   Universal Protocol:    Date/Time: 03/24/2020  Consent Given By: the patient  Position: PRONE  Additional Comments: Vital signs were monitored before and after the procedure. Patient was prepped and draped in the usual sterile fashion. The correct patient, procedure, and site was verified.   Injection Procedure Details:  Procedure Site One Meds Administered:  Meds ordered this encounter  Medications  . methylPREDNISolone acetate (DEPO-MEDROL) injection 40 mg    Laterality: Bilateral  Location/Site:  L4-L5  Needle size: 22 G  Needle type: Spinal  Needle Placement: Transforaminal  Findings:    -Comments: Excellent flow of contrast along the nerve, nerve root and into the epidural space.  Procedure Details: After squaring off the end-plates to get a true AP view, the C-arm was positioned so that an oblique view of the foramen as noted above was visualized. The target area is just inferior to the "nose of the scotty dog" or sub pedicular. The soft tissues overlying this structure were infiltrated with 2-3 ml. of 1% Lidocaine without Epinephrine.  The spinal needle was inserted toward the target using a "trajectory" view along the fluoroscope beam.  Under AP and lateral visualization, the needle was advanced so it did not puncture dura and was located close the 6 O'Clock position of the pedical in AP tracterory. Biplanar projections were used to confirm position. Aspiration was confirmed to be negative for CSF and/or blood. A 1-2 ml. volume of Isovue-250 was injected and flow of contrast was noted at each level. Radiographs were obtained for documentation purposes.   After attaining the desired flow of contrast documented above, a 0.5 to 1.0 ml  test dose of 0.25% Marcaine was injected into each respective transforaminal space.  The patient was observed for 90 seconds post injection.  After no sensory deficits were reported, and normal lower extremity motor function was noted,   the above injectate was administered so that equal amounts of the injectate were placed at each foramen (level) into the transforaminal epidural space.   Additional Comments:  The patient tolerated the procedure well Dressing: 2 x 2 sterile gauze and Band-Aid    Post-procedure details: Patient was observed during the procedure. Post-procedure instructions were reviewed.  Patient left the clinic in stable condition.

## 2020-03-24 NOTE — Telephone Encounter (Signed)
See if Anthony Park can do work time out for his disability, shot can take 1 week to help.

## 2020-03-24 NOTE — Telephone Encounter (Signed)
Patient stopped by after his appointment today.   He said his time out of work was extended in conclusion of his appointment with no definite return date. He needs documentation of that sent to the company handling his disability.   Their fax number is : [phone] [facility]

## 2020-03-24 NOTE — Telephone Encounter (Signed)
Patient called asked for a call back concerning the restrictions he has after the injection today. Patient said he wanted to see if it is ok for him to do some leg exercises in his 4 ft pool. The number to contact patient is [phone]

## 2020-03-24 NOTE — Telephone Encounter (Signed)
Will write note to ROW until next follow-up appointment with Dr Lajoyce Corners.

## 2020-03-24 NOTE — Telephone Encounter (Signed)
Called patient to advise again that he should not submerge in water for 24 hours following his injection.

## 2020-03-24 NOTE — Progress Notes (Signed)
Anthony Park - 59 y.o. male MRN [id number]  Date of birth: [birth year]  Office Visit Note: Visit Date: 03/24/2020 PCP: Doren Custard, FNP Referred by: Doren Custard, FNP  Subjective: Chief Complaint  Patient presents with  . Lower Back - Pain   HPI:  Anthony Park is a 59 y.o. male who comes in today For consultation and potential interventional spine procedure at the request of Dr. Aldean Baker and [place] persons, PA-C.  He initially reported increasing hip pain back in December.  He had no specific injury.  This was predominantly the left hip.  He also reports some left knee pain.  He has a history of diabetes which is diet-controlled at this point with last hemoglobin A1c of 6.7 a year ago.  He has had amputations performed by Dr. Lajoyce Corners of his toes on both feet.  These notes are reviewed.  He started out with Aleve for his back and hip pain which was somewhat beneficial he had to reduce his work hours.  He then went on to have a course of prednisone which was very beneficial but not great for this diabetes but it did help but his symptoms returned.  Ultimately failing conservative care including exercises and rest and activity modification and anti-inflammatory use he had MRI of the lumbar spine.  This is reviewed with the patient today using spine models and imaging.  As reviewed in the notes below including reviewing the report.  He reports 5 out of 10 pain in general but it can worsen with prolonged standing and walking.  He reports he is unable to lift up his left leg without it hurting.  He denies any groin pain.  He reports pain with sitting too long and going from sit to stand.  He does continue to do core exercises.  Review of Systems  Musculoskeletal: Positive for back pain and joint pain.  All other systems reviewed and are negative.  Otherwise per HPI.  Assessment & Plan: Visit Diagnoses:  1. Lumbar radiculopathy   2. Spinal stenosis of lumbar region with neurogenic  claudication   3. Spondylosis without myelopathy or radiculopathy, lumbar region     Plan: Findings:  Chronic worsening low back and bilateral buttock pain left more than right hip pain some left knee pain.  No real radicular complaints in terms of paresthesia but he does get referral in the buttocks and neurogenic claudication symptoms.  He has moderate to severe stenosis to facet arthropathy ligamentum flavum thickening and epidural lipomatosis.  The lipomatosis is likely from his diabetes is not very large individual.  He does have quite a bit of arthropathy at L4-5 without listhesis.  I think at this point failing conservative care he does need bilateral L4 transforaminal epidural steroid injection.  Depending on relief could look at diagnostic facet joint blocks at L4-5.  Patient may be a candidate for lumbar decompression laminectomy.    Meds & Orders:  Meds ordered this encounter  Medications  . methylPREDNISolone acetate (DEPO-MEDROL) injection 40 mg    Orders Placed This Encounter  Procedures  . XR C-ARM NO REPORT  . Epidural Steroid injection    Follow-up: Return if symptoms worsen or fail to improve, for Consider Facet Block.   Procedures: No procedures performed  Lumbosacral Transforaminal Epidural Steroid Injection - Sub-Pedicular Approach with Fluoroscopic Guidance  Patient: Anthony Park      Date of Birth: 03-29-1961 MRN: [id number] PCP: Doren Custard, FNP  Visit Date: 03/24/2020   Universal Protocol:    Date/Time: 03/24/2020  Consent Given By: the patient  Position: PRONE  Additional Comments: Vital signs were monitored before and after the procedure. Patient was prepped and draped in the usual sterile fashion. The correct patient, procedure, and site was verified.   Injection Procedure Details:  Procedure Site One Meds Administered:  Meds ordered this encounter  Medications  . methylPREDNISolone acetate (DEPO-MEDROL) injection 40 mg     Laterality: Bilateral  Location/Site:  L4-L5  Needle size: 22 G  Needle type: Spinal  Needle Placement: Transforaminal  Findings:    -Comments: Excellent flow of contrast along the nerve, nerve root and into the epidural space.  Procedure Details: After squaring off the end-plates to get a true AP view, the C-arm was positioned so that an oblique view of the foramen as noted above was visualized. The target area is just inferior to the "nose of the scotty dog" or sub pedicular. The soft tissues overlying this structure were infiltrated with 2-3 ml. of 1% Lidocaine without Epinephrine.  The spinal needle was inserted toward the target using a "trajectory" view along the fluoroscope beam.  Under AP and lateral visualization, the needle was advanced so it did not puncture dura and was located close the 6 O'Clock position of the pedical in AP tracterory. Biplanar projections were used to confirm position. Aspiration was confirmed to be negative for CSF and/or blood. A 1-2 ml. volume of Isovue-250 was injected and flow of contrast was noted at each level. Radiographs were obtained for documentation purposes.   After attaining the desired flow of contrast documented above, a 0.5 to 1.0 ml test dose of 0.25% Marcaine was injected into each respective transforaminal space.  The patient was observed for 90 seconds post injection.  After no sensory deficits were reported, and normal lower extremity motor function was noted,   the above injectate was administered so that equal amounts of the injectate were placed at each foramen (level) into the transforaminal epidural space.   Additional Comments:  The patient tolerated the procedure well Dressing: 2 x 2 sterile gauze and Band-Aid    Post-procedure details: Patient was observed during the procedure. Post-procedure instructions were reviewed.  Patient left the clinic in stable condition.     Clinical History: CLINICAL DATA:  Lumbar  radiculopathy, low back pain radiating down right leg  EXAM: MRI LUMBAR SPINE WITHOUT CONTRAST  TECHNIQUE: Multiplanar, multisequence MR imaging of the lumbar spine was performed. No intravenous contrast was administered.  COMPARISON:  None.  FINDINGS: Segmentation:  Standard.  Alignment:  Anteroposterior alignment is maintained.  Vertebrae: There is mild marrow edema associated with right L4-L5 facet arthropathy.  Conus medullaris and cauda equina: Conus extends to the L1-L2 level. Conus and cauda equina appear normal.  Paraspinal and other soft tissues: Unremarkable.  Disc levels:  L1-L2:  Mild facet arthropathy.  No significant stenosis.  L2-L3:  Mild facet arthropathy.  No significant stenosis.  L3-L4: Disc bulge eccentric to the left. Mild facet arthropathy. Prominent epidural fat. Minor canal and left greater than right foraminal stenosis.  L4-L5: Disc bulge with endplate osteophytic ridging. Marked facet arthropathy with ligamentum flavum infolding and joint effusion. Prominent epidural fat. Moderate canal and foraminal stenosis.  L5-S1: Disc bulge with endplate osteophytic ridging. Marked facet arthropathy with ligamentum flavum infolding. Prominent epidural fat mildly effaces the thecal sac. Moderate right and minor left foraminal stenosis.  IMPRESSION: Multilevel degenerative changes as detailed above. Canal stenosis is  greatest at L4-L5 but much of the narrowing is due to prominent epidural fat. There is right foraminal stenosis at L4-L5 and L5-S1.   Electronically Signed   By: Guadlupe Spanish M.D.   On: 02/18/2020 11:14     Objective:  VS:  HT:    WT:   BMI:     BP:(!) 198/122  HR:86bpm  TEMP: ( )  RESP:  Physical Exam Constitutional:      General: He is not in acute distress.    Appearance: Normal appearance. He is not ill-appearing.  HENT:     Head: Normocephalic and atraumatic.     Right Ear: External ear normal.      Left Ear: External ear normal.  Eyes:     Extraocular Movements: Extraocular movements intact.  Cardiovascular:     Rate and Rhythm: Normal rate.     Pulses: Normal pulses.  Abdominal:     General: There is no distension.     Palpations: Abdomen is soft.  Musculoskeletal:        General: No tenderness or signs of injury.     Right lower leg: No edema.     Left lower leg: No edema.     Comments: Patient has good distal strength without clonus.  He ambulates with a cane.  He does not have any pain with hip rotation.  He does have pain with extension of the lumbar spine and facet loading.  Skin:    Findings: No erythema or rash.  Neurological:     General: No focal deficit present.     Mental Status: He is alert and oriented to person, place, and time.     Sensory: No sensory deficit.     Motor: No weakness or abnormal muscle tone.     Coordination: Coordination normal.  Psychiatric:        Mood and Affect: Mood normal.        Behavior: Behavior normal.      Imaging: XR C-ARM NO REPORT  Result Date: 03/24/2020 Please see Notes tab for imaging impression.

## 2020-03-27 ENCOUNTER — Telehealth: Payer: Self-pay | Admitting: Family Medicine

## 2020-03-27 ENCOUNTER — Telehealth: Payer: Self-pay | Admitting: Orthopedic Surgery

## 2020-03-27 NOTE — Telephone Encounter (Signed)
Please advise, thank you.

## 2020-03-27 NOTE — Telephone Encounter (Signed)
Patient states he missed a call from nurse and is returning call. Call back [phone]

## 2020-03-27 NOTE — Telephone Encounter (Signed)
Patient is calling to ask the nurse to advise him on treatment for a rash he has from having a ortho procedure from a referral that Maurice Small sent in.  He has a rash in his scrotum area and thinks it might have come from the injection he received from his ortho procedure.  Please advise and call patient to discuss at [phone]

## 2020-03-27 NOTE — Telephone Encounter (Signed)
Pt called stating after his injection on 03/24/20 he started to notice an itch and now its a full ''jock rash'' and would like to know if he could get something prescribed or if not would neosporin would do the trick?  [phone]

## 2020-03-28 NOTE — Telephone Encounter (Signed)
Left message for patient to call and make appointment for a provider to look at his Rash or may go to [facility]

## 2020-03-28 NOTE — Telephone Encounter (Signed)
Patient was called and lvm for OTC medicine.

## 2020-03-28 NOTE — Telephone Encounter (Signed)
The powder for this is OTC, try lotrimin powder spray

## 2020-04-02 ENCOUNTER — Encounter: Payer: Self-pay | Admitting: Physician Assistant

## 2020-04-02 ENCOUNTER — Ambulatory Visit (INDEPENDENT_AMBULATORY_CARE_PROVIDER_SITE_OTHER): Payer: BC Managed Care – PPO | Admitting: Physician Assistant

## 2020-04-02 ENCOUNTER — Other Ambulatory Visit: Payer: Self-pay

## 2020-04-02 VITALS — Ht 72.0 in | Wt 218.0 lb

## 2020-04-02 DIAGNOSIS — M25552 Pain in left hip: Secondary | ICD-10-CM | POA: Diagnosis not present

## 2020-04-02 NOTE — Progress Notes (Signed)
Office Visit Note   Patient: Anthony Park           Date of Birth: [birth year]           MRN: [id number] Visit Date: 04/02/2020              Requested by: Doren Custard, FNP [address] [place],  [place] [address] PCP: Doren Custard, FNP  Chief Complaint  Patient presents with  . Lower Back - Follow-up      HPI: This is a pleasant 59 year old gentleman who has been seeing me for sciatica and left hip pain.  His sciatica was the more dominant complaint and he was set up for ESI with Dr. Alvester Morin he states this is helped him quite a bit he continues to have left hip pain that radiates from his hip down to his knee.  If he walks for an extended distance he is hesitant to flex his hip and walks very stiff legged  Assessment & Plan: Visit Diagnoses:  1. Pain in left hip     Plan: I reviewed the x-rays with the patient.  Initially he was not interested in hip replacement.  He is got severe advanced degenerative changes of the left hip and degenerative changes of the right hip although the right hip is not symptomatic.  He is considering an intra-articular hip injection with Dr. Alvester Morin.  After long discussion he would also like to meet with Dr. Magnus Ivan to discuss hip replacement.  Of note he does have on his x-ray and abdominal aortic aneurysm that is stable and has been recently evaluated by vascular  Follow-Up Instructions: No follow-ups on file.   Ortho Exam  Patient is alert, oriented, no adenopathy, well-dressed, normal affect, normal respiratory effort. Bilateral hips have very little internal or external rotation.  The left side has some pain with this the right side does not.  He has a significant antalgic gait especially after sitting.  Distal CMS is intact  Imaging: No results found. No images are attached to the encounter.  Labs: Lab Results  Component Value Date   HGBA1C 6.7 (H) 11/05/2018     Lab Results  Component Value Date   ALBUMIN 3.5  11/06/2018    No results found for: MG No results found for: VD25OH  No results found for: PREALBUMIN CBC EXTENDED Latest Ref Rng & Units 11/06/2018 11/05/2018  WBC 4.0 - 10.5 K/uL 6.4 6.8  RBC 4.22 - 5.81 MIL/uL 4.70 5.26  HGB 13.0 - 17.0 g/dL 73.4 19.3  HCT 39 - 52 % 41.2 46.6  PLT 150 - 400 K/uL 161 170     Body mass index is 29.57 kg/m.  Orders:  Orders Placed This Encounter  Procedures  . Ambulatory referral to Physical Medicine Rehab   No orders of the defined types were placed in this encounter.    Procedures: No procedures performed  Clinical Data: No additional findings.  ROS:  All other systems negative, except as noted in the HPI. Review of Systems  Objective: Vital Signs: Ht 6' (1.829 m)   Wt 218 lb (98.9 kg)   BMI 29.57 kg/m   Specialty Comments:  No specialty comments available.  PMFS History: Patient Active Problem List   Diagnosis Date Noted  . Amputated toe, right (HCC) 11/22/2018  . Type 2 diabetes mellitus with foot ulcer, without long-term current use of insulin (HCC) 11/22/2018  . Essential hypertension 11/22/2018  . Mixed hyperlipidemia 11/22/2018  . Morbid  obesity (HCC) 11/22/2018  . Aneurysm of left popliteal artery (HCC) 11/22/2018  . Status post peripheral artery angioplasty with insertion of stent 11/22/2018  . Encounter for smoking cessation counseling 11/22/2018  . Osteomyelitis of third toe of right foot (HCC)   . Cellulitis of third toe of right foot    Past Medical History:  Diagnosis Date  . Diabetes mellitus without complication (HCC)   . Diabetic toe ulcer (HCC) 11/05/2018  . Hyperlipidemia   . Hypertension     Family History  Problem Relation Age of Onset  . COPD Mother   . Cataracts Mother   . Brain cancer Father   . Hypertension Brother     Past Surgical History:  Procedure Laterality Date  . AMPUTATION Right 11/07/2018   Procedure: RIGHT 3RD TOE AMPUTATION;  Surgeon: Nadara Mustard, MD;  Location[facility] OR;   Service: Orthopedics;  Laterality: Right;  . AMPUTATION TOE Left    big toe and one next to it  . CATARACT EXTRACTION Bilateral   . TOE AMPUTATION Left 2015   Social History   Occupational History  . Not on file  Tobacco Use  . Smoking status: Current Every Day Smoker    Packs/day: 1.00    Types: Cigarettes  . Smokeless tobacco: Never Used  Vaping Use  . Vaping Use: Never used  Substance and Sexual Activity  . Alcohol use: Yes    Alcohol/week: 7.0 standard drinks    Types: 7 Shots of liquor per week    Comment: 7 per week  . Drug use: Not Currently  . Sexual activity: Yes

## 2020-04-03 ENCOUNTER — Telehealth: Payer: Self-pay | Admitting: Physician Assistant

## 2020-04-03 NOTE — Telephone Encounter (Signed)
Tried to call pt and advise no answer and no message available will hold and try again later.

## 2020-04-03 NOTE — Telephone Encounter (Signed)
Yes he should try to if possible

## 2020-04-03 NOTE — Telephone Encounter (Signed)
Patient called.   York Spaniel he was told that he needs to be free of steroids for hip surgery. Wants to know if she should be weaning himself off now.   Call back: [phone]

## 2020-04-03 NOTE — Telephone Encounter (Signed)
Please see this message below and advise.

## 2020-04-17 ENCOUNTER — Encounter: Payer: Self-pay | Admitting: Orthopaedic Surgery

## 2020-04-17 ENCOUNTER — Ambulatory Visit (INDEPENDENT_AMBULATORY_CARE_PROVIDER_SITE_OTHER): Payer: BC Managed Care – PPO | Admitting: Orthopaedic Surgery

## 2020-04-17 VITALS — Ht 72.0 in | Wt 228.0 lb

## 2020-04-17 DIAGNOSIS — M1612 Unilateral primary osteoarthritis, left hip: Secondary | ICD-10-CM

## 2020-04-17 DIAGNOSIS — M1611 Unilateral primary osteoarthritis, right hip: Secondary | ICD-10-CM | POA: Diagnosis not present

## 2020-04-17 NOTE — Progress Notes (Signed)
Office Visit Note   Patient: Anthony Park           Date of Birth: October 25, 1960           MRN: [id number] Visit Date: 04/17/2020              Requested by: Doren Custard, FNP [address] [place],  [place] [address] PCP: Doren Custard, FNP   Assessment & Plan: Visit Diagnoses:  1. Unilateral primary osteoarthritis, right hip   2. Unilateral primary osteoarthritis, left hip     Plan:   At this point I have recommended a total hip arthroplasty on the left side.  I explained the rationale behind this as well as went over his x-rays with him.  We talked about the risk and benefits of surgery in detail.  I showed him his x-rays and a hip model and explained what his interoperative and postoperative course involves.  I discussed the risk and benefits of surgery in detail.  Given the failure of conservative treatment and the findings of clinical exam and plain films this is certainly medically warranted in this standpoint.  All questions and concerns were answered addressed.  Work on getting this scheduled in the near future.  Follow-Up Instructions: Return for 2 weeks post-op.   Orders:  No orders of the defined types were placed in this encounter.  No orders of the defined types were placed in this encounter.     Procedures: No procedures performed   Clinical Data: No additional findings.   Subjective: No chief complaint on file. The patient is a very pleasant 59 year old sent from Dr. Lajoyce Corners and Clerance Lav to evaluate and treat severe end-stage arthritis of his left hip.  X-rays in December of last year of the left hip show complete loss of joint space.  It is actually almost obliterated in terms of the space.  There is large periarticular osteophytes all around the hip itself and flattening of the femoral head.  He does walk with a cane.  He has a significant Trendelenburg gait.  His left hip is very stiff.  He cannot put on his shoes and socks well because of this.   At this point his left hip pain is 10 out of 10 daily and it is detriment affecting his mobility, his quality of life and his activities day living.  He walks with a walking stick as well.  He has had back injections due to severe stenosis of his lumbar spine but I do feel his leg length discrepancy is also affecting his back significantly.  He has tried and failed all forms conservative treatment.  He is diabetic but under excellent control at this point.  He is lost a lot of weight as well.  HPI  Review of Systems He currently denies any headache, chest pain, shortness of breath, fever, chills, nausea, vomiting  Objective: Vital Signs: Ht 6' (1.829 m)   Wt 228 lb (103.4 kg)   BMI 30.92 kg/m   Physical Exam He is alert and orient x3 and in no acute distress Ortho Exam Examination of his left hip shows almost no internal and external rotation due to about rotation as well as stiffness.  He has severe pain when I attempt to rotate his hip.  His right hip moves better but he is slightly stiff.  His leg lengths are not equal. Specialty Comments:  No specialty comments available.  Imaging: No results found. X-rays from December 2020 of the  pelvis and right hip as well as left hip showed severe end-stage arthritis of actually both hips but is much worse almost 10 times worse on the left side with particular osteophytes and flattening of the femoral head as well as significant sclerotic changes.  PMFS History: Patient Active Problem List   Diagnosis Date Noted  . Unilateral primary osteoarthritis, right hip 04/17/2020  . Unilateral primary osteoarthritis, left hip 04/17/2020  . Amputated toe, right (HCC) 11/22/2018  . Type 2 diabetes mellitus with foot ulcer, without long-term current use of insulin (HCC) 11/22/2018  . Essential hypertension 11/22/2018  . Mixed hyperlipidemia 11/22/2018  . Morbid obesity (HCC) 11/22/2018  . Aneurysm of left popliteal artery (HCC) 11/22/2018  . Status  post peripheral artery angioplasty with insertion of stent 11/22/2018  . Encounter for smoking cessation counseling 11/22/2018  . Osteomyelitis of third toe of right foot (HCC)   . Cellulitis of third toe of right foot    Past Medical History:  Diagnosis Date  . Diabetes mellitus without complication (HCC)   . Diabetic toe ulcer (HCC) 11/05/2018  . Hyperlipidemia   . Hypertension     Family History  Problem Relation Age of Onset  . COPD Mother   . Cataracts Mother   . Brain cancer Father   . Hypertension Brother     Past Surgical History:  Procedure Laterality Date  . AMPUTATION Right 11/07/2018   Procedure: RIGHT 3RD TOE AMPUTATION;  Surgeon: Nadara Mustard, MD;  Location: [facility] OR;  Service: Orthopedics;  Laterality: Right;  . AMPUTATION TOE Left    big toe and one next to it  . CATARACT EXTRACTION Bilateral   . TOE AMPUTATION Left 2015   Social History   Occupational History  . Not on file  Tobacco Use  . Smoking status: Current Every Day Smoker    Packs/day: 1.00    Types: Cigarettes  . Smokeless tobacco: Never Used  Vaping Use  . Vaping Use: Never used  Substance and Sexual Activity  . Alcohol use: Yes    Alcohol/week: 7.0 standard drinks    Types: 7 Shots of liquor per week    Comment: 7 per week  . Drug use: Not Currently  . Sexual activity: Yes

## 2020-05-06 ENCOUNTER — Other Ambulatory Visit: Payer: Self-pay

## 2020-05-15 NOTE — Progress Notes (Signed)
Need orders in epic.  Preop on 05/20/20.  Surgery on 05/23/2020.

## 2020-05-15 NOTE — Progress Notes (Signed)
DUE TO COVID-19 ONLY ONE VISITOR IS ALLOWED TO COME WITH YOU AND STAY IN THE WAITING ROOM ONLY DURING PRE OP AND PROCEDURE DAY OF SURGERY. THE 1 VISITOR  MAY VISIT WITH YOU AFTER SURGERY IN YOUR PRIVATE ROOM DURING VISITING HOURS ONLY!  YOU NEED TO HAVE A COVID 19 TEST ON_9/03/2020 ______ @_______ , THIS TEST MUST BE DONE BEFORE SURGERY,  COVID TESTING SITE [address] [place] , IT IS ON THE RIGHT GOING OUT [address] APPROXIMATELY  2 MINUTES PAST [employer] ON THE RIGHT. ONCE YOUR COVID TEST IS COMPLETED,  PLEASE BEGIN THE QUARANTINE INSTRUCTIONS AS OUTLINED IN YOUR HANDOUT.                Anthony Park  05/15/2020   Your procedure is scheduled on: 05/23/20   Report to [facility] Main  Entrance   Report to admitting      [phone]     Call this number if you have problems the morning of surgery [phone]    REMEMBER: NO  SOLID FOOD CANDY OR GUM AFTER MIDNIGHT. CLEAR LIQUIDS . NOTHING BY MOUTH EXCEPT CLEAR LIQUIDS UNTIL 0825am   . PLEASE FINISH ENSURE DRINK PER SURGEON ORDER  WHICH NEEDS TO BE COMPLETED AT   0825am   .      CLEAR LIQUID DIET   Foods Allowed                                                                    Coffee and tea, regular and decaf                            Fruit ices (not with fruit pulp)                                      Iced Popsicles                                    Carbonated beverages, regular and diet                                    Cranberry, grape and apple juices Sports drinks like Gatorade Lightly seasoned clear broth or consume(fat free) Sugar, honey syrup ___________________________________________________________________      BRUSH YOUR TEETH MORNING OF SURGERY AND RINSE YOUR MOUTH OUT, NO CHEWING GUM CANDY OR MINTS.     Take these m                               You may not have any metal on your body including hair pins and              piercings  Do not wear jewelry, make-up,  lotions, powders or perfumes, deodorant             Do not wear nail polish on your fingernails.  Do not shave  48  hours prior to surgery.              Men may shave face and neck.   Do not bring valuables to the hospital. [facility] IS NOT             RESPONSIBLE   FOR VALUABLES.  Contacts, dentures or bridgework may not be worn into surgery.  Leave suitcase in the car. After surgery it may be brought to your room.     Patients discharged the day of surgery will not be allowed to drive home. IF YOU ARE HAVING SURGERY AND GOING HOME THE SAME DAY, YOU MUST HAVE AN ADULT TO DRIVE YOU HOME AND BE WITH YOU FOR 24 HOURS. YOU MAY GO HOME BY TAXI OR UBER OR ORTHERWISE, BUT AN ADULT MUST ACCOMPANY YOU HOME AND STAY WITH YOU FOR 24 HOURS.  Name and phone number of your driver:  Special Instructions: N/A              Please read over the following fact sheets you were given: _____________________________________________________________________  [facility] - Preparing for Surgery Before surgery, you can play an important role.  Because skin is not sterile, your skin needs to be as free of germs as possible.  You can reduce the number of germs on your skin by washing with CHG (chlorahexidine gluconate) soap before surgery.  CHG is an antiseptic cleaner which kills germs and bonds with the skin to continue killing germs even after washing. Please DO NOT use if you have an allergy to CHG or antibacterial soaps.  If your skin becomes reddened/irritated stop using the CHG and inform your nurse when you arrive at Short Stay. Do not shave (including legs and underarms) for at least 48 hours prior to the first CHG shower.  You may shave your face/neck. Please follow these instructions carefully:  1.  Shower with CHG Soap the night before surgery and the  morning of Surgery.  2.  If you choose to wash your hair, wash your hair first as usual with your  normal  shampoo.  3.  After you shampoo, rinse your hair  and body thoroughly to remove the  shampoo.                           4.  Use CHG as you would any other liquid soap.  You can apply chg directly  to the skin and wash                       Gently with a scrungie or clean washcloth.  5.  Apply the CHG Soap to your body ONLY FROM THE NECK DOWN.   Do not use on face/ open                           Wound or open sores. Avoid contact with eyes, ears mouth and genitals (private parts).                       Wash face,  Genitals (private parts) with your normal soap.             6.  Wash thoroughly, paying special attention to the area where your surgery  will be performed.  7.  Thoroughly rinse your body with warm water from the neck down.  8.  DO NOT shower/wash with your  normal soap after using and rinsing off  the CHG Soap.                9.  Pat yourself dry with a clean towel.            10.  Wear clean pajamas.            11.  Place clean sheets on your bed the night of your first shower and do not  sleep with pets. Day of Surgery : Do not apply any lotions/deodorants the morning of surgery.  Please wear clean clothes to the hospital/surgery center.  FAILURE TO FOLLOW THESE INSTRUCTIONS MAY RESULT IN THE CANCELLATION OF YOUR SURGERY PATIENT SIGNATURE_________________________________  NURSE SIGNATURE__________________________________  ________________________________________________________________________

## 2020-05-20 ENCOUNTER — Other Ambulatory Visit: Payer: Self-pay

## 2020-05-20 ENCOUNTER — Other Ambulatory Visit (HOSPITAL_COMMUNITY)
Admission: RE | Admit: 2020-05-20 | Discharge: 2020-05-20 | Disposition: A | Discharge status: Home / Self Care | Payer: BC Managed Care – PPO | Source: Ambulatory Visit | Attending: Orthopaedic Surgery | Admitting: Orthopaedic Surgery

## 2020-05-20 ENCOUNTER — Encounter (HOSPITAL_COMMUNITY)
Admission: RE | Admit: 2020-05-20 | Discharge: 2020-05-20 | Disposition: A | Discharge status: Home / Self Care | Payer: BC Managed Care – PPO | Source: Ambulatory Visit | Attending: Orthopaedic Surgery | Admitting: Orthopaedic Surgery

## 2020-05-20 ENCOUNTER — Encounter (HOSPITAL_COMMUNITY): Payer: Self-pay

## 2020-05-20 ENCOUNTER — Other Ambulatory Visit: Payer: Self-pay | Admitting: Physician Assistant

## 2020-05-20 DIAGNOSIS — Z01818 Encounter for other preprocedural examination: Secondary | ICD-10-CM | POA: Diagnosis not present

## 2020-05-20 DIAGNOSIS — Z20822 Contact with and (suspected) exposure to covid-19: Secondary | ICD-10-CM | POA: Diagnosis not present

## 2020-05-20 HISTORY — DX: Peripheral vascular disease, unspecified: I73.9

## 2020-05-20 HISTORY — DX: Unspecified osteoarthritis, unspecified site: M19.90

## 2020-05-20 LAB — BASIC METABOLIC PANEL
Anion gap: 16 — ABNORMAL HIGH (ref 5–15)
BUN: 11 mg/dL (ref 6–20)
CO2: 22 mmol/L (ref 22–32)
Calcium: 9.9 mg/dL (ref 8.9–10.3)
Chloride: 100 mmol/L (ref 98–111)
Creatinine, Ser: 0.81 mg/dL (ref 0.61–1.24)
GFR calc Af Amer: >60 mL/min (ref >60)
GFR calc non Af Amer: >60 mL/min (ref >60)
Glucose, Bld: 129 mg/dL — ABNORMAL HIGH (ref 70–99)
Potassium: 4.1 mmol/L (ref 3.5–5.1)
Sodium: 138 mmol/L (ref 135–145)

## 2020-05-20 LAB — SURGICAL PCR SCREEN
MRSA, PCR: NEGATIVE
Staphylococcus aureus: NEGATIVE

## 2020-05-20 LAB — HEMOGLOBIN A1C
Hgb A1c MFr Bld: 6.8 % — ABNORMAL HIGH (ref 4.8–5.6)
Mean Plasma Glucose: 148.46 mg/dL

## 2020-05-20 LAB — CBC
HCT: 45.4 % (ref 39.0–52.0)
Hemoglobin: 14.9 g/dL (ref 13.0–17.0)
MCH: 30.1 pg (ref 26.0–34.0)
MCHC: 32.8 g/dL (ref 30.0–36.0)
MCV: 91.7 fL (ref 80.0–100.0)
Platelets: 205 10*3/uL (ref 150–400)
RBC: 4.95 MIL/uL (ref 4.22–5.81)
RDW: 12.7 % (ref 11.5–15.5)
WBC: 6.7 10*3/uL (ref 4.0–10.5)
nRBC: 0 % (ref 0.0–0.2)

## 2020-05-20 LAB — GLUCOSE, CAPILLARY: Glucose-Capillary: 137 mg/dL — ABNORMAL HIGH (ref 70–99)

## 2020-05-20 LAB — SARS CORONAVIRUS 2 (TAT 6-24 HRS): SARS Coronavirus 2: NEGATIVE

## 2020-05-21 ENCOUNTER — Telehealth: Payer: Self-pay | Admitting: *Deleted

## 2020-05-21 NOTE — Telephone Encounter (Signed)
RNCM from office contacted patient to review his upcoming surgery on Friday, 05/23/20 with Dr. Magnus Ivan. He will be having a Left Total hip arthroplasty as ambulatory surgery. Reviewed all post-op instructions and allowed for patient to ask questions. He has a significant other that will be assisting him at home after discharge. He has a FWW, crutches, and a cane. Declined 3in1/BSC. Aware that HHPT will be anticipated after discharge on the next day. Choice provided and referral made to [facility]. Patient already has his 2 week post-op appointment scheduled for 06/05/20 at 2:00 pm with Dr. Magnus Ivan.

## 2020-05-22 ENCOUNTER — Other Ambulatory Visit: Payer: Self-pay | Admitting: Orthopaedic Surgery

## 2020-05-22 MED ORDER — METHOCARBAMOL 500 MG PO TABS: 500.0000 mg | ORAL_TABLET | Freq: Four times a day (QID) | ORAL | 1 refills | Status: DC | PRN

## 2020-05-22 MED ORDER — OXYCODONE HCL 5 MG PO TABS: 5.0000 mg | ORAL_TABLET | ORAL | 0 refills | Status: DC | PRN

## 2020-05-22 MED ORDER — ASPIRIN EC 81 MG PO TBEC: 81.0000 mg | DELAYED_RELEASE_TABLET | Freq: Two times a day (BID) | ORAL | 0 refills | Status: DC

## 2020-05-22 MED ORDER — ONDANSETRON 4 MG PO TBDP: 4.0000 mg | ORAL_TABLET | Freq: Three times a day (TID) | ORAL | 0 refills | Status: DC | PRN

## 2020-05-22 NOTE — H&P (Signed)
TOTAL HIP ADMISSION H&P  Patient is admitted for left total hip arthroplasty.  Subjective:  Chief Complaint: left hip pain  HPI: Anthony Park, 59 y.o. male, has a history of pain and functional disability in the left hip(s) due to arthritis and patient has failed non-surgical conservative treatments for greater than 12 weeks to include NSAID's and/or analgesics, flexibility and strengthening excercises and activity modification.  Onset of symptoms was gradual starting 1 years ago with gradually worsening course since that time.The patient noted no past surgery on the left hip(s).  Patient currently rates pain in the left hip at 10 out of 10 with activity. Patient has night pain, worsening of pain with activity and weight bearing, trendelenberg gait, pain that interfers with activities of daily living, pain with passive range of motion and crepitus. Patient has evidence of subchondral cysts, subchondral sclerosis, periarticular osteophytes and joint space narrowing by imaging studies. This condition presents safety issues increasing the risk of falls.  There is no current active infection.  Patient Active Problem List   Diagnosis Date Noted   Unilateral primary osteoarthritis, right hip 04/17/2020   Unilateral primary osteoarthritis, left hip 04/17/2020   Amputated toe, right (HCC) 11/22/2018   Type 2 diabetes mellitus with foot ulcer, without long-term current use of insulin (HCC) 11/22/2018   Essential hypertension 11/22/2018   Mixed hyperlipidemia 11/22/2018   Morbid obesity (HCC) 11/22/2018   Aneurysm of left popliteal artery (HCC) 11/22/2018   Status post peripheral artery angioplasty with insertion of stent 11/22/2018   Encounter for smoking cessation counseling 11/22/2018   Osteomyelitis of third toe of right foot (HCC)    Cellulitis of third toe of right foot    Past Medical History:  Diagnosis Date   Arthritis    Diabetes mellitus without complication (HCC)     type 2 controlled with diet    Diabetic toe ulcer (HCC) 11/05/2018   Hyperlipidemia    Hypertension    Peripheral vascular disease (HCC)    stent in left leg due to anerysm     Past Surgical History:  Procedure Laterality Date   AMPUTATION Right 11/07/2018   Procedure: RIGHT 3RD TOE AMPUTATION;  Surgeon: Nadara Mustard, MD;  Location: [facility] OR;  Service: Orthopedics;  Laterality: Right;   AMPUTATION TOE Left    big toe and one next to it   CATARACT EXTRACTION Bilateral    TOE AMPUTATION Left 2015    No current facility-administered medications for this encounter.   Current Outpatient Medications  Medication Sig Dispense Refill Last Dose   ascorbic acid (VITAMIN C) 500 MG tablet Take 500 mg by mouth daily.      cholecalciferol (VITAMIN D3) 25 MCG (1000 UNIT) tablet Take 1,000 Units by mouth daily.      lisinopril (ZESTRIL) 20 MG tablet TAKE 1 TABLET BY MOUTH EVERY DAY (Patient taking differently: Take 20 mg by mouth daily. ) 90 tablet 1    Multiple Vitamins-Minerals (MULTIVITAMIN WITH MINERALS) tablet Take 1 tablet by mouth daily.      Turmeric 500 MG CAPS Take 500 mg by mouth daily.      amLODipine (NORVASC) 5 MG tablet Take 1 tablet (5 mg total) by mouth daily. (Patient not taking: Reported on 05/14/2020) 90 tablet 3 Not Taking at Unknown time   aspirin EC 81 MG tablet Take 1 tablet (81 mg total) by mouth 2 (two) times daily after a meal. Swallow whole. 30 tablet 0    methocarbamol (ROBAXIN) 500 MG tablet  Take 1 tablet (500 mg total) by mouth every 6 (six) hours as needed for muscle spasms. 40 tablet 1    mupirocin ointment (BACTROBAN) 2 % Apply 1 application topically 2 (two) times daily. Apply to the affected area 2 times a day (Patient not taking: Reported on 05/14/2020) 22 g 3 Completed Course at Unknown time   ondansetron (ZOFRAN ODT) 4 MG disintegrating tablet Take 1 tablet (4 mg total) by mouth every 8 (eight) hours as needed for nausea or vomiting. 20 tablet 0     oxyCODONE (ROXICODONE) 5 MG immediate release tablet Take 1 tablet (5 mg total) by mouth every 4 (four) hours as needed for severe pain. 30 tablet 0    predniSONE (DELTASONE) 10 MG tablet Take 1 tablet (10 mg total) by mouth daily with breakfast. (Patient not taking: Reported on 05/14/2020) 30 tablet 1 Completed Course at Unknown time   predniSONE (DELTASONE) 10 MG tablet TAKE TWO TABLETS WITH BREAKFAST EACH MORNING ( TOTAL OF 20 MG PER DAY) (Patient not taking: Reported on 05/14/2020) 60 tablet 0 Completed Course at Unknown time   rosuvastatin (CRESTOR) 20 MG tablet Take 1 tablet (20 mg total) by mouth daily at 6 PM. (Patient not taking: Reported on 05/14/2020) 90 tablet 1 Not Taking at Unknown time   No Known Allergies  Social History   Tobacco Use   Smoking status: Current Every Day Smoker    Packs/day: 0.50    Types: Cigarettes   Smokeless tobacco: Never Used  Substance Use Topics   Alcohol use: Yes    Alcohol/week: 7.0 standard drinks    Types: 7 Shots of liquor per week    Comment: 7 per week    Family History  Problem Relation Age of Onset   COPD Mother    Cataracts Mother    Brain cancer Father    Hypertension Brother      Review of Systems  All other systems reviewed and are negative.   Objective:  Physical Exam Vitals reviewed.  Constitutional:      Appearance: Normal appearance.  HENT:     Head: Normocephalic and atraumatic.  Eyes:     Pupils: Pupils are equal, round, and reactive to light.  Cardiovascular:     Rate and Rhythm: Normal rate.  Pulmonary:     Effort: Pulmonary effort is normal.  Abdominal:     Palpations: Abdomen is soft.  Musculoskeletal:     Cervical back: Normal range of motion.     Left hip: Tenderness present. Decreased range of motion. Decreased strength.  Neurological:     Mental Status: He is alert and oriented to person, place, and time.  Psychiatric:        Behavior: Behavior normal.     Vital signs in last 24 hours:     Labs:   Estimated body mass index is 29.84 kg/m as calculated from the following:   Height as of 05/20/20: 6' (1.829 m).   Weight as of 05/20/20: 99.8 kg.   Imaging Review Plain radiographs demonstrate severe degenerative joint disease of the left hip(s). The bone quality appears to be excellent for age and reported activity level.      Assessment/Plan:  End Park arthritis, left hip(s)  The patient history, physical examination, clinical judgement of the provider and imaging studies are consistent with end Park degenerative joint disease of the left hip(s) and total hip arthroplasty is deemed medically necessary. The treatment options including medical management, injection therapy, arthroscopy and arthroplasty were  discussed at length. The risks and benefits of total hip arthroplasty were presented and reviewed. The risks due to aseptic loosening, infection, stiffness, dislocation/subluxation,  thromboembolic complications and other imponderables were discussed.  The patient acknowledged the explanation, agreed to proceed with the plan and consent was signed. Patient is being admitted for inpatient treatment for surgery, pain control, PT, OT, prophylactic antibiotics, VTE prophylaxis, progressive ambulation and ADL's and discharge planning.The patient is planning to be discharged home with home health services    Patient's anticipated LOS is less than 2 midnights, meeting these requirements: - Younger than 73 - Lives within 1 hour of care - Has a competent adult at home to recover with post-op recover - NO history of  - Chronic pain requiring opiods  - Diabetes  - Coronary Artery Disease  - Heart failure  - Heart attack  - Stroke  - DVT/VTE  - Cardiac arrhythmia  - Respiratory Failure/COPD  - Renal failure  - Anemia  - Advanced Liver disease

## 2020-05-23 ENCOUNTER — Encounter (HOSPITAL_COMMUNITY): Payer: Self-pay | Admitting: Orthopaedic Surgery

## 2020-05-23 ENCOUNTER — Ambulatory Visit (HOSPITAL_COMMUNITY): Payer: BC Managed Care – PPO

## 2020-05-23 ENCOUNTER — Ambulatory Visit (HOSPITAL_COMMUNITY): Payer: BC Managed Care – PPO | Admitting: Anesthesiology

## 2020-05-23 ENCOUNTER — Encounter (HOSPITAL_COMMUNITY)
Admission: RE | Disposition: A | Discharge status: Home / Self Care | Payer: Self-pay | Source: Home / Self Care | Attending: Orthopaedic Surgery

## 2020-05-23 ENCOUNTER — Ambulatory Visit (HOSPITAL_COMMUNITY)
Admission: RE | Admit: 2020-05-23 | Discharge: 2020-05-23 | Disposition: A | Discharge status: Home / Self Care | Payer: BC Managed Care – PPO | Attending: Orthopaedic Surgery | Admitting: Orthopaedic Surgery

## 2020-05-23 DIAGNOSIS — Z79899 Other long term (current) drug therapy: Secondary | ICD-10-CM | POA: Diagnosis not present

## 2020-05-23 DIAGNOSIS — M1612 Unilateral primary osteoarthritis, left hip: Secondary | ICD-10-CM

## 2020-05-23 DIAGNOSIS — Z419 Encounter for procedure for purposes other than remedying health state, unspecified: Secondary | ICD-10-CM

## 2020-05-23 DIAGNOSIS — Z96642 Presence of left artificial hip joint: Secondary | ICD-10-CM

## 2020-05-23 DIAGNOSIS — Z7982 Long term (current) use of aspirin: Secondary | ICD-10-CM | POA: Insufficient documentation

## 2020-05-23 DIAGNOSIS — E119 Type 2 diabetes mellitus without complications: Secondary | ICD-10-CM | POA: Diagnosis not present

## 2020-05-23 DIAGNOSIS — E785 Hyperlipidemia, unspecified: Secondary | ICD-10-CM | POA: Diagnosis not present

## 2020-05-23 DIAGNOSIS — E1151 Type 2 diabetes mellitus with diabetic peripheral angiopathy without gangrene: Secondary | ICD-10-CM | POA: Diagnosis not present

## 2020-05-23 DIAGNOSIS — E782 Mixed hyperlipidemia: Secondary | ICD-10-CM | POA: Insufficient documentation

## 2020-05-23 DIAGNOSIS — M160 Bilateral primary osteoarthritis of hip: Secondary | ICD-10-CM | POA: Diagnosis not present

## 2020-05-23 DIAGNOSIS — Z471 Aftercare following joint replacement surgery: Secondary | ICD-10-CM | POA: Diagnosis not present

## 2020-05-23 DIAGNOSIS — Z6829 Body mass index (BMI) 29.0-29.9, adult: Secondary | ICD-10-CM | POA: Insufficient documentation

## 2020-05-23 DIAGNOSIS — I10 Essential (primary) hypertension: Secondary | ICD-10-CM | POA: Insufficient documentation

## 2020-05-23 HISTORY — PX: TOTAL HIP ARTHROPLASTY: SHX124

## 2020-05-23 LAB — GLUCOSE, CAPILLARY
Glucose-Capillary: 140 mg/dL — ABNORMAL HIGH (ref 70–99)
Glucose-Capillary: 147 mg/dL — ABNORMAL HIGH (ref 70–99)

## 2020-05-23 LAB — TYPE AND SCREEN
ABO/RH(D): A POS
Antibody Screen: NEGATIVE

## 2020-05-23 LAB — ABO/RH: ABO/RH(D): A POS

## 2020-05-23 SURGERY — ARTHROPLASTY, HIP, TOTAL, ANTERIOR APPROACH
Anesthesia: Monitor Anesthesia Care | Site: Hip | Laterality: Left

## 2020-05-23 MED ORDER — HYDROMORPHONE HCL 1 MG/ML IJ SOLN
INTRAMUSCULAR | Status: DC
Start: 2020-05-23 — End: 2020-05-23
  Filled 2020-05-23: qty 1

## 2020-05-23 MED ORDER — METOCLOPRAMIDE HCL 5 MG PO TABS
5.0000 mg | ORAL_TABLET | Freq: Three times a day (TID) | ORAL | Status: DC | PRN
  Filled 2020-05-23: qty 2

## 2020-05-23 MED ORDER — SODIUM CHLORIDE 0.9 % IR SOLN
Status: DC | PRN
  Administered 2020-05-23: 1000 mL

## 2020-05-23 MED ORDER — ACETAMINOPHEN 500 MG PO TABS
1000.0000 mg | ORAL_TABLET | Freq: Once | ORAL | Status: AC
  Administered 2020-05-23: 1000 mg via ORAL
  Filled 2020-05-23: qty 2

## 2020-05-23 MED ORDER — LABETALOL HCL 5 MG/ML IV SOLN
INTRAVENOUS | Status: AC
  Administered 2020-05-23: 5 mg via INTRAVENOUS
  Filled 2020-05-23: qty 4

## 2020-05-23 MED ORDER — LIDOCAINE 2% (20 MG/ML) 5 ML SYRINGE
INTRAMUSCULAR | Status: AC
  Filled 2020-05-23: qty 5

## 2020-05-23 MED ORDER — HYDRALAZINE HCL 20 MG/ML IJ SOLN
INTRAMUSCULAR | Status: DC | PRN
  Administered 2020-05-23: 10 mg via INTRAVENOUS

## 2020-05-23 MED ORDER — 0.9 % SODIUM CHLORIDE (POUR BTL) OPTIME
TOPICAL | Status: DC | PRN
  Administered 2020-05-23: 1000 mL

## 2020-05-23 MED ORDER — LACTATED RINGERS IV BOLUS
500.0000 mL | Freq: Once | INTRAVENOUS | Status: AC
  Administered 2020-05-23: 500 mL via INTRAVENOUS

## 2020-05-23 MED ORDER — PROMETHAZINE HCL 25 MG/ML IJ SOLN: 6.2500 mg | INTRAMUSCULAR | Status: DC | PRN

## 2020-05-23 MED ORDER — METHOCARBAMOL 500 MG PO TABS: 500.0000 mg | ORAL_TABLET | Freq: Four times a day (QID) | ORAL | Status: DC | PRN

## 2020-05-23 MED ORDER — FENTANYL CITRATE (PF) 100 MCG/2ML IJ SOLN: 25.0000 ug | INTRAMUSCULAR | Status: DC | PRN

## 2020-05-23 MED ORDER — OXYCODONE HCL 5 MG PO TABS
10.0000 mg | ORAL_TABLET | ORAL | Status: DC | PRN
  Administered 2020-05-23: 15 mg via ORAL

## 2020-05-23 MED ORDER — LABETALOL HCL 5 MG/ML IV SOLN
5.0000 mg | INTRAVENOUS | Status: AC
  Administered 2020-05-23: 5 mg via INTRAVENOUS

## 2020-05-23 MED ORDER — BUPIVACAINE IN DEXTROSE 0.75-8.25 % IT SOLN
INTRATHECAL | Status: DC | PRN
  Administered 2020-05-23: 2 mL via INTRATHECAL

## 2020-05-23 MED ORDER — DEXAMETHASONE SODIUM PHOSPHATE 10 MG/ML IJ SOLN
INTRAMUSCULAR | Status: DC | PRN
  Administered 2020-05-23: 8 mg via INTRAVENOUS

## 2020-05-23 MED ORDER — CEFAZOLIN SODIUM-DEXTROSE 1-4 GM/50ML-% IV SOLN: 1.0000 g | Freq: Four times a day (QID) | INTRAVENOUS | Status: DC

## 2020-05-23 MED ORDER — ACETAMINOPHEN 325 MG PO TABS: 325.0000 mg | ORAL_TABLET | Freq: Four times a day (QID) | ORAL | Status: DC | PRN

## 2020-05-23 MED ORDER — DEXAMETHASONE SODIUM PHOSPHATE 10 MG/ML IJ SOLN
INTRAMUSCULAR | Status: AC
  Filled 2020-05-23: qty 1

## 2020-05-23 MED ORDER — LIDOCAINE HCL (CARDIAC) PF 100 MG/5ML IV SOSY
PREFILLED_SYRINGE | INTRAVENOUS | Status: DC | PRN
  Administered 2020-05-23: 60 mg via INTRAVENOUS

## 2020-05-23 MED ORDER — FENTANYL CITRATE (PF) 100 MCG/2ML IJ SOLN
INTRAMUSCULAR | Status: DC | PRN
Start: 2020-05-23 — End: 2020-05-23
  Administered 2020-05-23: 100 ug via INTRAVENOUS

## 2020-05-23 MED ORDER — OXYCODONE HCL 5 MG PO TABS: 5.0000 mg | ORAL_TABLET | ORAL | Status: DC | PRN

## 2020-05-23 MED ORDER — ORAL CARE MOUTH RINSE: 15.0000 mL | Freq: Once | OROMUCOSAL | Status: AC

## 2020-05-23 MED ORDER — PROPOFOL 10 MG/ML IV BOLUS: INTRAVENOUS | Status: DC | PRN

## 2020-05-23 MED ORDER — HYDROMORPHONE HCL 1 MG/ML IJ SOLN
0.5000 mg | INTRAMUSCULAR | Status: DC | PRN
  Administered 2020-05-23: 0.5 mg via INTRAVENOUS

## 2020-05-23 MED ORDER — MIDAZOLAM HCL 2 MG/2ML IJ SOLN
INTRAMUSCULAR | Status: AC
  Filled 2020-05-23: qty 2

## 2020-05-23 MED ORDER — MIDAZOLAM HCL 5 MG/5ML IJ SOLN
INTRAMUSCULAR | Status: DC | PRN
  Administered 2020-05-23: 2 mg via INTRAVENOUS

## 2020-05-23 MED ORDER — ALBUMIN HUMAN 5 % IV SOLN
INTRAVENOUS | Status: AC
  Filled 2020-05-23: qty 250

## 2020-05-23 MED ORDER — ONDANSETRON HCL 4 MG/2ML IJ SOLN
INTRAMUSCULAR | Status: AC
  Filled 2020-05-23: qty 2

## 2020-05-23 MED ORDER — CELECOXIB 200 MG PO CAPS
200.0000 mg | ORAL_CAPSULE | Freq: Once | ORAL | Status: AC
  Administered 2020-05-23: 200 mg via ORAL
  Filled 2020-05-23: qty 1

## 2020-05-23 MED ORDER — ONDANSETRON HCL 4 MG/2ML IJ SOLN: 4.0000 mg | Freq: Four times a day (QID) | INTRAMUSCULAR | Status: DC | PRN

## 2020-05-23 MED ORDER — TRANEXAMIC ACID-NACL 1000-0.7 MG/100ML-% IV SOLN
1000.0000 mg | INTRAVENOUS | Status: AC
  Administered 2020-05-23: 1000 mg via INTRAVENOUS
  Filled 2020-05-23: qty 100

## 2020-05-23 MED ORDER — PHENYLEPHRINE HCL-NACL 10-0.9 MG/250ML-% IV SOLN
INTRAVENOUS | Status: DC | PRN
  Administered 2020-05-23: 30 ug/min via INTRAVENOUS

## 2020-05-23 MED ORDER — FENTANYL CITRATE (PF) 100 MCG/2ML IJ SOLN
INTRAMUSCULAR | Status: AC
  Filled 2020-05-23: qty 2

## 2020-05-23 MED ORDER — STERILE WATER FOR IRRIGATION IR SOLN
Status: DC | PRN
  Administered 2020-05-23: 2000 mL

## 2020-05-23 MED ORDER — METOCLOPRAMIDE HCL 5 MG/ML IJ SOLN: 5.0000 mg | Freq: Three times a day (TID) | INTRAMUSCULAR | Status: DC | PRN

## 2020-05-23 MED ORDER — ONDANSETRON HCL 4 MG PO TABS
4.0000 mg | ORAL_TABLET | Freq: Four times a day (QID) | ORAL | Status: DC | PRN
  Filled 2020-05-23: qty 1

## 2020-05-23 MED ORDER — LABETALOL HCL 5 MG/ML IV SOLN
INTRAVENOUS | Status: AC
  Filled 2020-05-23: qty 4

## 2020-05-23 MED ORDER — CHLORHEXIDINE GLUCONATE 0.12 % MT SOLN
15.0000 mL | Freq: Once | OROMUCOSAL | Status: AC
  Administered 2020-05-23: 15 mL via OROMUCOSAL

## 2020-05-23 MED ORDER — LACTATED RINGERS IV SOLN: INTRAVENOUS | Status: DC

## 2020-05-23 MED ORDER — ALBUMIN HUMAN 5 % IV SOLN: INTRAVENOUS | Status: DC | PRN

## 2020-05-23 MED ORDER — PROPOFOL 10 MG/ML IV BOLUS
INTRAVENOUS | Status: DC | PRN
  Administered 2020-05-23 (×2): 20 mg via INTRAVENOUS

## 2020-05-23 MED ORDER — PROPOFOL 500 MG/50ML IV EMUL
INTRAVENOUS | Status: DC | PRN
  Administered 2020-05-23: 125 ug/kg/min via INTRAVENOUS

## 2020-05-23 MED ORDER — CEFAZOLIN SODIUM-DEXTROSE 2-4 GM/100ML-% IV SOLN
2.0000 g | INTRAVENOUS | Status: AC
  Administered 2020-05-23: 2 g via INTRAVENOUS
  Filled 2020-05-23: qty 100

## 2020-05-23 MED ORDER — LACTATED RINGERS IV BOLUS
250.0000 mL | Freq: Once | INTRAVENOUS | Status: AC
  Administered 2020-05-23: 250 mL via INTRAVENOUS

## 2020-05-23 MED ORDER — CEFAZOLIN SODIUM-DEXTROSE 1-4 GM/50ML-% IV SOLN
INTRAVENOUS | Status: AC
  Filled 2020-05-23: qty 50

## 2020-05-23 MED ORDER — METHOCARBAMOL 500 MG IVPB - SIMPLE MED: 500.0000 mg | Freq: Four times a day (QID) | INTRAVENOUS | Status: DC | PRN

## 2020-05-23 MED ORDER — OXYCODONE HCL 5 MG PO TABS
ORAL_TABLET | ORAL | Status: DC
Start: 2020-05-23 — End: 2020-05-23
  Filled 2020-05-23: qty 3

## 2020-05-23 MED ORDER — ONDANSETRON HCL 4 MG/2ML IJ SOLN
INTRAMUSCULAR | Status: DC | PRN
  Administered 2020-05-23: 4 mg via INTRAVENOUS

## 2020-05-23 MED ORDER — POVIDONE-IODINE 10 % EX SWAB
2.0000 "application " | Freq: Once | CUTANEOUS | Status: AC
  Administered 2020-05-23: 2 via TOPICAL

## 2020-05-23 SURGICAL SUPPLY — 39 items
BAG ZIPLOCK 12X15 (MISCELLANEOUS) IMPLANT
BENZOIN TINCTURE PRP APPL 2/3 (GAUZE/BANDAGES/DRESSINGS) IMPLANT
BLADE SAW SGTL 18X1.27X75 (BLADE) ×2 IMPLANT
COVER PERINEAL POST (MISCELLANEOUS) ×2 IMPLANT
COVER SURGICAL LIGHT HANDLE (MISCELLANEOUS) ×2 IMPLANT
COVER WAND RF STERILE (DRAPES) ×2 IMPLANT
CUP SECTOR GRIPTON 58MM (Orthopedic Implant) ×2 IMPLANT
DRAPE STERI IOBAN 125X83 (DRAPES) ×2 IMPLANT
DRAPE U-SHAPE 47X51 STRL (DRAPES) ×4 IMPLANT
DRSG AQUACEL AG ADV 3.5X10 (GAUZE/BANDAGES/DRESSINGS) ×2 IMPLANT
DURAPREP 26ML APPLICATOR (WOUND CARE) ×2 IMPLANT
ELECT REM PT RETURN 15FT ADLT (MISCELLANEOUS) ×2 IMPLANT
GAUZE XEROFORM 1X8 LF (GAUZE/BANDAGES/DRESSINGS) ×2 IMPLANT
GLOVE BIO SURGEON STRL SZ7.5 (GLOVE) ×2 IMPLANT
GLOVE BIOGEL PI IND STRL 8 (GLOVE) ×2 IMPLANT
GLOVE BIOGEL PI INDICATOR 8 (GLOVE) ×2
GLOVE ECLIPSE 8.0 STRL XLNG CF (GLOVE) ×2 IMPLANT
GOWN STRL REUS W/TWL XL LVL3 (GOWN DISPOSABLE) ×4 IMPLANT
HANDPIECE INTERPULSE COAX TIP (DISPOSABLE) ×1
HEAD CERAMIC 36 PLUS5 (Hips) ×2 IMPLANT
HOLDER FOLEY CATH W/STRAP (MISCELLANEOUS) IMPLANT
KIT TURNOVER KIT A (KITS) IMPLANT
LINER NEUTRAL 36X58 PLUS4 ×2 IMPLANT
PACK ANTERIOR HIP CUSTOM (KITS) ×2 IMPLANT
PENCIL SMOKE EVACUATOR (MISCELLANEOUS) IMPLANT
SET HNDPC FAN SPRY TIP SCT (DISPOSABLE) ×1 IMPLANT
SPONGE LAP 18X18 RF (DISPOSABLE) ×2 IMPLANT
STAPLER VISISTAT 35W (STAPLE) IMPLANT
STEM CORAIL HC SZ14 135 (Stem) ×2 IMPLANT
STRIP CLOSURE SKIN 1/2X4 (GAUZE/BANDAGES/DRESSINGS) IMPLANT
SUT ETHIBOND NAB CT1 #1 30IN (SUTURE) ×4 IMPLANT
SUT ETHILON 2 0 PS N (SUTURE) IMPLANT
SUT MNCRL AB 4-0 PS2 18 (SUTURE) IMPLANT
SUT VIC AB 0 CT1 36 (SUTURE) ×2 IMPLANT
SUT VIC AB 1 CT1 36 (SUTURE) ×2 IMPLANT
SUT VIC AB 2-0 CT1 27 (SUTURE) ×2
SUT VIC AB 2-0 CT1 TAPERPNT 27 (SUTURE) ×2 IMPLANT
TRAY CATH 16FR W/PLASTIC CATH (SET/KITS/TRAYS/PACK) ×2 IMPLANT
TRAY FOLEY MTR SLVR 16FR STAT (SET/KITS/TRAYS/PACK) IMPLANT

## 2020-05-23 NOTE — Transfer of Care (Signed)
Immediate Anesthesia Transfer of Care Note  Patient: Anthony Park  Procedure(s) Performed: LEFT TOTAL HIP ARTHROPLASTY ANTERIOR APPROACH (Left Hip)  Patient Location: PACU  Anesthesia Type:Spinal  Level of Consciousness: awake, alert , oriented and patient cooperative  Airway & Oxygen Therapy: Patient Spontanous Breathing and Patient connected to face mask oxygen  Post-op Assessment: Report given to RN and Post -op Vital signs reviewed and stable  Post vital signs: Reviewed and stable  Last Vitals:  Vitals Value Taken Time  BP 119/86 05/23/20 1245  Temp    Pulse 71 05/23/20 1246  Resp 17 05/23/20 1246  SpO2 99 % 05/23/20 1246  Vitals shown include unvalidated device data.  Last Pain:  Vitals:   05/23/20 0926  TempSrc: Oral  PainSc:          Complications: No complications documented.

## 2020-05-23 NOTE — Discharge Instructions (Signed)
INSTRUCTIONS AFTER JOINT REPLACEMENT  ° °o Remove items at home which could result in a fall. This includes throw rugs or furniture in walking pathways °o ICE to the affected joint every three hours while awake for 30 minutes at a time, for at least the first 3-5 days, and then as needed for pain and swelling.  Continue to use ice for pain and swelling. You may notice swelling that will progress down to the foot and ankle.  This is normal after surgery.  Elevate your leg when you are not up walking on it.   °o Continue to use the breathing machine you got in the hospital (incentive spirometer) which will help keep your temperature down.  It is common for your temperature to cycle up and down following surgery, especially at night when you are not up moving around and exerting yourself.  The breathing machine keeps your lungs expanded and your temperature down. ° ° °DIET:  As you were doing prior to hospitalization, we recommend a well-balanced diet. ° °DRESSING / WOUND CARE / SHOWERING ° °Keep the surgical dressing until follow up.  The dressing is water proof, so you can shower without any extra covering.  IF THE DRESSING FALLS OFF or the wound gets wet inside, change the dressing with sterile gauze.  Please use Kai Railsback hand washing techniques before changing the dressing.  Do not use any lotions or creams on the incision until instructed by your surgeon.   ° °ACTIVITY ° °o Increase activity slowly as tolerated, but follow the weight bearing instructions below.   °o No driving for 6 weeks or until further direction given by your physician.  You cannot drive while taking narcotics.  °o No lifting or carrying greater than 10 lbs. until further directed by your surgeon. °o Avoid periods of inactivity such as sitting longer than an hour when not asleep. This helps prevent blood clots.  °o You may return to work once you are authorized by your doctor.  ° ° ° °WEIGHT BEARING  ° °Weight bearing as tolerated with assist  device (walker, cane, etc) as directed, use it as long as suggested by your surgeon or therapist, typically at least 4-6 weeks. ° ° °EXERCISES ° °Results after joint replacement surgery are often greatly improved when you follow the exercise, range of motion and muscle strengthening exercises prescribed by your doctor. Safety measures are also important to protect the joint from further injury. Any time any of these exercises cause you to have increased pain or swelling, decrease what you are doing until you are comfortable again and then slowly increase them. If you have problems or questions, call your caregiver or physical therapist for advice.  ° °Rehabilitation is important following a joint replacement. After just a few days of immobilization, the muscles of the leg can become weakened and shrink (atrophy).  These exercises are designed to build up the tone and strength of the thigh and leg muscles and to improve motion. Often times heat used for twenty to thirty minutes before working out will loosen up your tissues and help with improving the range of motion but do not use heat for the first two weeks following surgery (sometimes heat can increase post-operative swelling).  ° °These exercises can be done on a training (exercise) mat, on the floor, on a table or on a bed. Use whatever works the best and is most comfortable for you.    Use music or television while you are exercising so that   the exercises are a pleasant break in your day. This will make your life better with the exercises acting as a break in your routine that you can look forward to.   Perform all exercises about fifteen times, three times per day or as directed.  You should exercise both the operative leg and the other leg as well. ° °Exercises include: °  °• Quad Sets - Tighten up the muscle on the front of the thigh (Quad) and hold for 5-10 seconds.   °• Straight Leg Raises - With your knee straight (if you were given a brace, keep it on),  lift the leg to 60 degrees, hold for 3 seconds, and slowly lower the leg.  Perform this exercise against resistance later as your leg gets stronger.  °• Leg Slides: Lying on your back, slowly slide your foot toward your buttocks, bending your knee up off the floor (only go as far as is comfortable). Then slowly slide your foot back down until your leg is flat on the floor again.  °• Angel Wings: Lying on your back spread your legs to the side as far apart as you can without causing discomfort.  °• Hamstring Strength:  Lying on your back, push your heel against the floor with your leg straight by tightening up the muscles of your buttocks.  Repeat, but this time bend your knee to a comfortable angle, and push your heel against the floor.  You may put a pillow under the heel to make it more comfortable if necessary.  ° °A rehabilitation program following joint replacement surgery can speed recovery and prevent re-injury in the future due to weakened muscles. Contact your doctor or a physical therapist for more information on knee rehabilitation.  ° ° °CONSTIPATION ° °Constipation is defined medically as fewer than three stools per week and severe constipation as less than one stool per week.  Even if you have a regular bowel pattern at home, your normal regimen is likely to be disrupted due to multiple reasons following surgery.  Combination of anesthesia, postoperative narcotics, change in appetite and fluid intake all can affect your bowels.  ° °YOU MUST use at least one of the following options; they are listed in order of increasing strength to get the job done.  They are all available over the counter, and you may need to use some, POSSIBLY even all of these options:   ° °Drink plenty of fluids (prune juice may be helpful) and high fiber foods °Colace 100 mg by mouth twice a day  °Senokot for constipation as directed and as needed Dulcolax (bisacodyl), take with full glass of water  °Miralax (polyethylene glycol)  once or twice a day as needed. ° °If you have tried all these things and are unable to have a bowel movement in the first 3-4 days after surgery call either your surgeon or your primary doctor.   ° °If you experience loose stools or diarrhea, hold the medications until you stool forms back up.  If your symptoms do not get better within 1 week or if they get worse, check with your doctor.  If you experience "the worst abdominal pain ever" or develop nausea or vomiting, please contact the office immediately for further recommendations for treatment. ° ° °ITCHING:  If you experience itching with your medications, try taking only a single pain pill, or even half a pain pill at a time.  You can also use Benadryl over the counter for itching or also to   help with sleep.   TED HOSE STOCKINGS:  Use stockings on both legs until for at least 2 weeks or as directed by physician office. They may be removed at night for sleeping.  MEDICATIONS:  See your medication summary on the After Visit Summary that nursing will review with you.  You may have some home medications which will be placed on hold until you complete the course of blood thinner medication.  It is important for you to complete the blood thinner medication as prescribed.  PRECAUTIONS:  If you experience chest pain or shortness of breath - call 911 immediately for transfer to the hospital emergency department.   If you develop a fever greater that 101 F, purulent drainage from wound, increased redness or drainage from wound, foul odor from the wound/dressing, or calf pain - CONTACT YOUR SURGEON.                                                   FOLLOW-UP APPOINTMENTS:  If you do not already have a post-op appointment, please call the office for an appointment to be seen by your surgeon.  Guidelines for how soon to be seen are listed in your After Visit Summary, but are typically between 1-4 weeks after surgery.  OTHER INSTRUCTIONS:   Knee  Replacement:  Do not place pillow under knee, focus on keeping the knee straight while resting. CPM instructions: 0-90 degrees, 2 hours in the morning, 2 hours in the afternoon, and 2 hours in the evening. Place foam block, curve side up under heel at all times except when in CPM or when walking.  DO NOT modify, tear, cut, or change the foam block in any way.   DENTAL ANTIBIOTICS:  In most cases prophylactic antibiotics for Dental procdeures after total joint surgery are not necessary.  Exceptions are as follows:  1. History of prior total joint infection  2. Severely immunocompromised (Organ Transplant, cancer chemotherapy, Rheumatoid biologic meds such as Humera)  3. Poorly controlled diabetes (A1C &gt; 8.0, blood glucose over 200)  If you have one of these conditions, contact your surgeon for an antibiotic prescription, prior to your dental procedure.   MAKE SURE YOU:   Understand these instructions.   Get help right away if you are not doing well or get worse.    Thank you for letting us be a part of your medical care team.  It is a privilege we respect greatly.  We hope these instructions will help you stay on track for a fast and full recovery!  Dental Antibiotics:  In most cases prophylactic antibiotics for Dental procdeures after total joint surgery are not necessary.  Exceptions are as follows:  1. History of prior total joint infection  2. Severely immunocompromised (Organ Transplant, cancer chemotherapy, Rheumatoid biologic meds such as Humera)  3. Poorly controlled diabetes (A1C &gt; 8.0, blood glucose over 200)  If you have one of these conditions, contact your surgeon for an antibiotic prescription, prior to your dental procedure.   General Anesthesia, Adult, Care After This sheet gives you information about how to care for yourself after your procedure. Your health care provider may also give you more specific instructions. If you have problems or  questions, contact your health care provider. What can I expect after the procedure? After the procedure, the following side effects are common:  Pain or discomfort at the IV site.  Nausea.  Vomiting.  Sore throat.  Trouble concentrating.  Feeling cold or chills.  Weak or tired.  Sleepiness and fatigue.  Soreness and body aches. These side effects can affect parts of the body that were not involved in surgery. Follow these instructions at home:  For at least 24 hours after the procedure:  Have a responsible adult stay with you. It is important to have someone help care for you until you are awake and alert.  Rest as needed.  Do not: ? Participate in activities in which you could fall or become injured. ? Drive. ? Use heavy machinery. ? Drink alcohol. ? Take sleeping pills or medicines that cause drowsiness. ? Make important decisions or sign legal documents. ? Take care of children on your own. Eating and drinking  Follow any instructions from your health care provider about eating or drinking restrictions.  When you feel hungry, start by eating small amounts of foods that are soft and easy to digest (bland), such as toast. Gradually return to your regular diet.  Drink enough fluid to keep your urine pale yellow.  If you vomit, rehydrate by drinking water, juice, or clear broth. General instructions  If you have sleep apnea, surgery and certain medicines can increase your risk for breathing problems. Follow instructions from your health care provider about wearing your sleep device: ? Anytime you are sleeping, including during daytime naps. ? While taking prescription pain medicines, sleeping medicines, or medicines that make you drowsy.  Return to your normal activities as told by your health care provider. Ask your health care provider what activities are safe for you.  Take over-the-counter and prescription medicines only as told by your health care  provider.  If you smoke, do not smoke without supervision.  Keep all follow-up visits as told by your health care provider. This is important. Contact a health care provider if:  You have nausea or vomiting that does not get better with medicine.  You cannot eat or drink without vomiting.  You have pain that does not get better with medicine.  You are unable to pass urine.  You develop a skin rash.  You have a fever.  You have redness around your IV site that gets worse. Get help right away if:  You have difficulty breathing.  You have chest pain.  You have blood in your urine or stool, or you vomit blood. Summary  After the procedure, it is common to have a sore throat or nausea. It is also common to feel tired.  Have a responsible adult stay with you for the first 24 hours after general anesthesia. It is important to have someone help care for you until you are awake and alert.  When you feel hungry, start by eating small amounts of foods that are soft and easy to digest (bland), such as toast. Gradually return to your regular diet.  Drink enough fluid to keep your urine pale yellow.  Return to your normal activities as told by your health care provider. Ask your health care provider what activities are safe for you. This information is not intended to replace advice given to you by your health care provider. Make sure you discuss any questions you have with your health care provider. Document Revised: 09/02/2017 Document Reviewed: 04/15/2017 Elsevier Patient Education  2020 [employer].

## 2020-05-23 NOTE — Anesthesia Preprocedure Evaluation (Addendum)
Anesthesia Evaluation  Patient identified by MRN, date of birth, ID band Patient awake    Reviewed: Allergy & Precautions, NPO status , Patient's Chart, lab work & pertinent test results  History of Anesthesia Complications Negative for: history of anesthetic complications  Airway Mallampati: II  TM Distance: >3 FB Neck ROM: Full    Dental no notable dental hx. (+) Dental Advisory Given   Pulmonary Current SmokerPatient did not abstain from smoking.,    Pulmonary exam normal        Cardiovascular hypertension, Pt. on medications + Peripheral Vascular Disease  Normal cardiovascular exam     Neuro/Psych negative neurological ROS  negative psych ROS   GI/Hepatic negative GI ROS, Neg liver ROS,   Endo/Other  diabetes  Renal/GU negative Renal ROS     Musculoskeletal negative musculoskeletal ROS (+)   Abdominal (+) - obese,   Peds  Hematology negative hematology ROS (+)   Anesthesia Other Findings   Reproductive/Obstetrics                           Anesthesia Physical  Anesthesia Plan  ASA: III  Anesthesia Plan: Spinal and MAC   Post-op Pain Management:    Induction:   PONV Risk Score and Plan: 2 and Ondansetron and Propofol infusion  Airway Management Planned: Natural Airway  Additional Equipment:   Intra-op Plan:   Post-operative Plan:   Informed Consent: I have reviewed the patients History and Physical, chart, labs and discussed the procedure including the risks, benefits and alternatives for the proposed anesthesia with the patient or authorized representative who has indicated his/her understanding and acceptance.     Dental advisory given  Plan Discussed with: Anesthesiologist and CRNA  Anesthesia Plan Comments:        Anesthesia Quick Evaluation

## 2020-05-23 NOTE — Brief Op Note (Signed)
05/23/2020  12:23 PM  PATIENT:  Anthony Park  59 y.o. male  PRE-OPERATIVE DIAGNOSIS:  osteoarthritis left hip  POST-OPERATIVE DIAGNOSIS:  osteoarthritis left hip  PROCEDURE:  Procedure(s): LEFT TOTAL HIP ARTHROPLASTY ANTERIOR APPROACH (Left)  SURGEON:  Surgeon(s) and Role:    Kathryne Hitch, MD - Primary  PHYSICIAN ASSISTANT:  Rexene Edison, PA-C  ANESTHESIA:   spinal  EBL:  400 mL   COUNTS:  YES  DICTATION: .Other Dictation: Dictation Number [id number]  PLAN OF CARE: Discharge to home after PACU  PATIENT DISPOSITION:  PACU - hemodynamically stable.   Delay start of Pharmacological VTE agent (>24hrs) due to surgical blood loss or risk of bleeding: no

## 2020-05-23 NOTE — Interval H&P Note (Signed)
History and Physical Interval Note: The patient understands he is here today for a left total hip arthroplasty to treat the pain from his left hip osteoarthritis.  There has been no interval or acute changes in medical status.  See recent H&P.  The risk and benefits of surgery been explained in detail and informed consent is obtained.  The left hip is been marked.  05/23/2020 10:21 AM  Anthony Park  has presented today for surgery, with the diagnosis of osteoarthritis left hip.  The various methods of treatment have been discussed with the patient and family. After consideration of risks, benefits and other options for treatment, the patient has consented to  Procedure(s): LEFT TOTAL HIP ARTHROPLASTY ANTERIOR APPROACH (Left) as a surgical intervention.  The patient's history has been reviewed, patient examined, no change in status, stable for surgery.  I have reviewed the patient's chart and labs.  Questions were answered to the patient's satisfaction.     Kathryne Hitch

## 2020-05-23 NOTE — Anesthesia Procedure Notes (Signed)
Spinal  Patient location during procedure: OR End time: 05/23/2020 11:05 AM Staffing Performed: resident/CRNA  Resident/CRNA: Caryl Pina T, CRNA Preanesthetic Checklist Completed: patient identified, IV checked, site marked, risks and benefits discussed, surgical consent, monitors and equipment checked, pre-op evaluation and timeout performed Spinal Block Patient position: sitting Prep: DuraPrep Patient monitoring: heart rate, cardiac monitor, continuous pulse ox and blood pressure Approach: midline Location: L3-4 Injection technique: single-shot Needle Needle type: Pencan  Needle gauge: 24 G Needle length: 9 cm Assessment Sensory level: T4 Additional Notes IV functioning, monitors applied to pt. Expiration date of kit checked and confirmed to be in date. Sterile prep and drape, hand hygiene and sterile gloved used. Pt was positioned and spine was prepped in sterile fashion. Skin was anesthetized with lidocaine. Free flow of clear CSF obtained prior to injecting local anesthetic into CSF x 1 attempt. Spinal needle aspirated freely following injection. Needle was carefully withdrawn, and pt tolerated procedure well. Loss of motor and sensory on exam post injection.

## 2020-05-23 NOTE — Care Plan (Signed)
RNCM from office contacted patient to review his upcoming surgery on Friday, 05/23/20 with Dr. Magnus Ivan. He will be having a Left Total hip arthroplasty as ambulatory surgery. Reviewed all post-op instructions and allowed for patient to ask questions. He has a significant other that will be assisting him at home after discharge. He has a FWW, crutches, and a cane. Declined 3in1/BSC. Aware that HHPT will be anticipated after discharge on the next day. Choice provided and referral made to [facility]. Patient already has his 2 week post-op appointment scheduled for 06/05/20 at 2:00 pm with Dr. Magnus Ivan. Will assist if there are any other needs. Ralph Dowdy, RN Case Manager [phone].

## 2020-05-23 NOTE — Op Note (Signed)
NAME: DAKARRI, KESSINGER MEDICAL RECORD JO:[id number] ACCOUNT [id number] DATE OF BIRTH:05-07-1961 FACILITY: [facility] LOCATION: [facility] PHYSICIAN:Naleyah Ohlinger Aretha Parrot, MD  OPERATIVE REPORT  DATE OF PROCEDURE:  05/23/2020  PREOPERATIVE DIAGNOSIS:  Primary osteoarthritis and degenerative joint disease, left hip.  POSTOPERATIVE DIAGNOSIS:  Primary osteoarthritis and degenerative joint disease, left hip.  PROCEDURE:  Left total hip arthroplasty through direct anterior approach.  IMPLANTS:  DePuy Sector Gription acetabular component size 58, size 36+4 neutral polyethylene liner, size 14 Corail femoral component with high offset, size 36+5 ceramic hip ball.  SURGEON:  Vanita Panda. Magnus Ivan, MD  ASSISTANT:  Richardean Canal, PA-C.  ANESTHESIA:  Spinal.  ANTIBIOTICS:  Two g IV Ancef.  ESTIMATED BLOOD LOSS:  400 mL.  COMPLICATIONS:  None.  INDICATIONS:  The patient is an active 59 year old gentleman with debilitating arthritis involving actually both his hips, but the right one is not nearly as bad the left one.  The right one is bad, but the left has profound end-stage arthritis.  The  head is flattened and at this point, his hip pain is detrimentally affecting his activities of daily living, his quality of life and his mobility to the point he does wish to proceed with total hip arthroplasty.  We talked in length about the surgery in  detail.  We discussed the risks and benefits including the risk of acute blood loss anemia, nerve or vessel injury, fracture, infection, dislocation, DVT and implant failure and skin and soft tissue issues.  We talked about our goals being decreased  pain, improved mobility and overall improved quality of life.  DESCRIPTION OF PROCEDURE:  After informed consent was obtained, the appropriate left hip was marked.  He was brought to the operating room and sat up the stretcher where spinal anesthesia was obtained.  He was laid in supine position.  Traction boots   were placed on both his feet.  Next, he was placed supine on the Hana fracture table with a perineal post in place and both legs in line skeletal traction device and no traction applied.  His left operative hip was prepped and draped with DuraPrep and  sterile drapes.  A time-out was called.  He was identified as correct patient, correct left hip.  I then made an incision just inferior and posterior to the anterior superior iliac spine and carried this obliquely down the leg.  We dissected down to the  tensor fascia lata muscle.  Tensor fascia was then divided longitudinally to proceed with direct anterior approach to the hip.  We identified and cauterized circumflex vessels and identified the hip capsule, opened the hip capsule in an L-type format,  finding a moderate joint effusion and significant periarticular osteophytes around the femoral head and neck.  We made our femoral neck cut with an oscillating saw almost at the level of the calcar, but just proximal to this given the fact that he has a  short neck.  We used an osteotome to complete our femoral neck cut and placed a corkscrew guide in the femoral head and removed the femoral head in its entirety and found it to be significantly devoid of cartilage and flattened and malformed.  We then  removed osteophytes from all around the acetabulum, placed a bent Hohmann over the medial acetabular rim and removed remnants of the acetabular labrum and other debris from the acetabulum.  We then began reaming under stepwise increments from a small 44  reamer, going all the way up to a size 57, with all  reamers placed under direct visualization, the last reamer was also placed under direct fluoroscopy so we could obtain our depth of reaming, our inclination and anteversion.  I then placed the real  DePuy system Sector Gription acetabular component size 58 and a 36+4 polyethylene liner for that size 58 acetabular component.  Attention was then turned to the  femur.  With the leg externally rotated to 120 degrees, extended and adducted, we were able  to place a Mueller retractor medially and a Hohman retractor behind the greater trochanter, released lateral joint capsule and used a box-cutting osteotome to enter the femoral canal and a rongeur to lateralize.  We then began broaching using the Corail  broaching system from a size 8 going up to a size 14.  Based on his anatomy, with a size 14 in place, we trialed a standard offset femoral neck and a 36+1.5 hip ball, reduced this in the acetabulum and we definitely needed more offset and leg length.    We dislocated the hip and removed the trial components.  We then ended up going with the real Corail femoral component size 14, but with high offset and we went with a real 36+5 ceramic hip ball and again reduced this in the acetabulum.  We were pleased  with range of motion, leg length, offset and stability assessed mechanically and radiographically.  We then irrigated the soft tissue with normal saline solution using pulsatile lavage.  We were able to reapproximate the joint capsule with #1 Ethibond  suture.  The tensor fascia was closed with #1 Vicryl.  Zero Vicryl was used to close the deep tissue and 2-0 Vicryl was used to close the subcutaneous tissue.  The skin was reapproximated with staples.  Xeroform and Aquacel dressing was applied.  The  patient was taken to recovery room in stable condition, with all final counts being correct and no complications noted.  Of note, Rexene Edison, PA-C, assisted during the entire case.  His assistance was crucial for facilitating all aspects of this case.  VN/NUANCE  D:05/23/2020 T:05/23/2020 JOB:[id number]

## 2020-05-23 NOTE — Anesthesia Postprocedure Evaluation (Signed)
Anesthesia Post Note  Patient: Anthony Park  Procedure(s) Performed: LEFT TOTAL HIP ARTHROPLASTY ANTERIOR APPROACH (Left Hip)     Patient location during evaluation: PACU Anesthesia Type: MAC and Spinal Level of consciousness: awake and alert Pain management: pain level controlled Vital Signs Assessment: post-procedure vital signs reviewed and stable Respiratory status: spontaneous breathing and respiratory function stable Cardiovascular status: blood pressure returned to baseline and stable Postop Assessment: spinal receding Anesthetic complications: no   No complications documented.  Last Vitals:  Vitals:   05/23/20 1400 05/23/20 1415  BP: (!) 157/95 (!) 167/100  Pulse: 65 72  Resp: 13 17  Temp:  36.4 C  SpO2: 98% 98%    Last Pain:  Vitals:   05/23/20 1415  TempSrc:   PainSc: 0-No pain                 [name] DANIEL

## 2020-05-23 NOTE — Evaluation (Signed)
Physical Therapy Evaluation Patient Details Name: Anthony Park MRN: [id number] DOB: [birth year] Today's Date: 05/23/2020   History of Present Illness  Patient is 59 y.o. male s/p Lt THA anterior approach on 05/23/20 with PMH significant for HTN, HLD, DM, OA, PVD, Rt toe amputations, Lt 2 toe amputation.     Clinical Impression  Anthony Park is a 60 y.o. male POD 0 s/p Lt THA. Patient reports independence with [facility] for mobility at baseline. Patient is now limited by functional impairments (see PT problem list below) and requires supervision/min guard for transfers and gait with RW. Patient was able to ambulate ~120 feet with RW and supervision and cues for safe walker management. Patient educated on safe sequencing for stair mobility and verbalized safe guarding position for people assisting with mobility. Patient instructed in exercises to facilitate ROM and circulation. Patient will benefit from continued skilled PT interventions to address impairments and progress towards PLOF. Patient has met mobility goals at adequate level for discharge home; will continue to follow if pt continues acute stay to progress towards Mod I goals.     Follow Up Recommendations Follow surgeon's recommendation for DC plan and follow-up therapies;Home health PT    Equipment Recommendations  None recommended by PT    Recommendations for Other Services       Precautions / Restrictions Precautions Precautions: Fall Restrictions Weight Bearing Restrictions: No      Mobility  Bed Mobility Overal bed mobility: Needs Assistance Bed Mobility: Supine to Sit     Supine to sit: Supervision     General bed mobility comments: no assist required, some extra time needed.   Transfers Overall transfer level: Needs assistance Equipment used: Rolling walker (2 wheeled) Transfers: Sit to/from Stand Sit to Stand: Min guard;Supervision         General transfer comment: no assist required for power up, cues  given for hand placement.  Ambulation/Gait Ambulation/Gait assistance: Supervision;Min guard Gait Distance (Feet): 120 Feet Assistive device: Rolling walker (2 wheeled) Gait Pattern/deviations: Step-to pattern;Decreased stride length Gait velocity: decr   General Gait Details: cues for safe step pattern with RW and safe proximity throughout. no overt LOB noted.   Stairs Stairs: Yes Stairs assistance: Min guard Stair Management: Two rails;Forwards;Step to pattern Number of Stairs: 2 General stair comments: cues for safe step pattern "up with good, down with bad". pt safe with sequencing and verbalized understanding of family assist.   Wheelchair Mobility    Modified Rankin (Stroke Patients Only)       Balance Overall balance assessment: Needs assistance Sitting-balance support: Feet supported Sitting balance-Leahy Scale: Good     Standing balance support: During functional activity;Bilateral upper extremity supported Standing balance-Leahy Scale: Fair              Pertinent Vitals/Pain Pain Assessment: No/denies pain    Home Living Family/patient expects to be discharged to:: Private residence Living Arrangements: Spouse/significant other Available Help at Discharge: Family Type of Home: House Home Access: Stairs to enter Entrance Stairs-Rails: None Entrance Stairs-Number of Steps: 2 Home Layout: One level Home Equipment: Environmental consultant - 2 wheels;Cane - single point      Prior Function Level of Independence: Independent with assistive device(s)         Comments: using SPC for mobiltiy recently     Hand Dominance   Dominant Hand: Right    Extremity/Trunk Assessment   Upper Extremity Assessment Upper Extremity Assessment: Overall WFL for tasks assessed    Lower Extremity Assessment Lower  Extremity Assessment: Overall WFL for tasks assessed    Cervical / Trunk Assessment Cervical / Trunk Assessment: Normal  Communication   Communication: No  difficulties  Cognition Arousal/Alertness: Awake/alert Behavior During Therapy: WFL for tasks assessed/performed Overall Cognitive Status: Within Functional Limits for tasks assessed             General Comments      Exercises Total Joint Exercises Ankle Circles/Pumps: AROM;Both;Seated;10 reps Quad Sets: AROM;Other reps (comment);Left;Seated (3) Heel Slides: AROM;Other reps (comment);Left;Seated (2) Hip ABduction/ADduction: AROM;Other reps (comment);Left;Seated (2)   Assessment/Plan    PT Assessment Patient needs continued PT services  PT Problem List Decreased strength;Decreased range of motion;Decreased activity tolerance;Decreased balance;Decreased mobility;Decreased knowledge of use of DME;Decreased knowledge of precautions       PT Treatment Interventions DME instruction;Gait training;Stair training;Functional mobility training;Therapeutic activities;Therapeutic exercise;Balance training;Patient/family education    PT Goals (Current goals can be found in the Care Plan section)  Acute Rehab PT Goals Patient Stated Goal: get home and back to work after recovering PT Goal Formulation: With patient Time For Goal Achievement: 05/30/20 Potential to Achieve Goals: Good    Frequency 7X/week   Barriers to discharge           AM-PAC PT "6 Clicks" Mobility  Outcome Measure Help needed turning from your back to your side while in a flat bed without using bedrails?: None Help needed moving from lying on your back to sitting on the side of a flat bed without using bedrails?: A Little Help needed moving to and from a bed to a chair (including a wheelchair)?: A Little Help needed standing up from a chair using your arms (e.g., wheelchair or bedside chair)?: A Little Help needed to walk in hospital room?: A Little Help needed climbing 3-5 steps with a railing? : A Little 6 Click Score: 19    End of Session Equipment Utilized During Treatment: Gait belt Activity Tolerance:  Patient tolerated treatment well Patient left: in chair;with call bell/phone within reach Nurse Communication: Mobility status PT Visit Diagnosis: Muscle weakness (generalized) (M62.81);Difficulty in walking, not elsewhere classified (R26.2)    Time: 0737-1062 PT Time Calculation (min) (ACUTE ONLY): 38 min   Charges:   PT Evaluation $PT Eval Low Complexity: 1 Low PT Treatments $Gait Training: 8-22 mins $Therapeutic Exercise: 8-22 mins        Verner Mould, DPT Acute Rehabilitation Services  Office [phone] Pager [phone]  05/23/2020 5:36 PM

## 2020-05-24 ENCOUNTER — Other Ambulatory Visit (HOSPITAL_COMMUNITY): Payer: BC Managed Care – PPO

## 2020-05-25 DIAGNOSIS — Z96642 Presence of left artificial hip joint: Secondary | ICD-10-CM | POA: Diagnosis not present

## 2020-05-25 DIAGNOSIS — E782 Mixed hyperlipidemia: Secondary | ICD-10-CM | POA: Diagnosis not present

## 2020-05-25 DIAGNOSIS — Z89421 Acquired absence of other right toe(s): Secondary | ICD-10-CM | POA: Diagnosis not present

## 2020-05-25 DIAGNOSIS — M1611 Unilateral primary osteoarthritis, right hip: Secondary | ICD-10-CM | POA: Diagnosis not present

## 2020-05-25 DIAGNOSIS — M869 Osteomyelitis, unspecified: Secondary | ICD-10-CM | POA: Diagnosis not present

## 2020-05-25 DIAGNOSIS — Z89422 Acquired absence of other left toe(s): Secondary | ICD-10-CM | POA: Diagnosis not present

## 2020-05-25 DIAGNOSIS — Z7982 Long term (current) use of aspirin: Secondary | ICD-10-CM | POA: Diagnosis not present

## 2020-05-25 DIAGNOSIS — Z471 Aftercare following joint replacement surgery: Secondary | ICD-10-CM | POA: Diagnosis not present

## 2020-05-25 DIAGNOSIS — E1169 Type 2 diabetes mellitus with other specified complication: Secondary | ICD-10-CM | POA: Diagnosis not present

## 2020-05-25 DIAGNOSIS — Z6829 Body mass index (BMI) 29.0-29.9, adult: Secondary | ICD-10-CM | POA: Diagnosis not present

## 2020-05-25 DIAGNOSIS — I10 Essential (primary) hypertension: Secondary | ICD-10-CM | POA: Diagnosis not present

## 2020-05-25 DIAGNOSIS — E1151 Type 2 diabetes mellitus with diabetic peripheral angiopathy without gangrene: Secondary | ICD-10-CM | POA: Diagnosis not present

## 2020-05-26 ENCOUNTER — Encounter (HOSPITAL_COMMUNITY): Payer: Self-pay | Admitting: Orthopaedic Surgery

## 2020-05-26 DIAGNOSIS — Z89421 Acquired absence of other right toe(s): Secondary | ICD-10-CM | POA: Diagnosis not present

## 2020-05-26 DIAGNOSIS — I10 Essential (primary) hypertension: Secondary | ICD-10-CM | POA: Diagnosis not present

## 2020-05-26 DIAGNOSIS — Z6829 Body mass index (BMI) 29.0-29.9, adult: Secondary | ICD-10-CM | POA: Diagnosis not present

## 2020-05-26 DIAGNOSIS — E1169 Type 2 diabetes mellitus with other specified complication: Secondary | ICD-10-CM | POA: Diagnosis not present

## 2020-05-26 DIAGNOSIS — E782 Mixed hyperlipidemia: Secondary | ICD-10-CM | POA: Diagnosis not present

## 2020-05-26 DIAGNOSIS — Z471 Aftercare following joint replacement surgery: Secondary | ICD-10-CM | POA: Diagnosis not present

## 2020-05-26 DIAGNOSIS — E1151 Type 2 diabetes mellitus with diabetic peripheral angiopathy without gangrene: Secondary | ICD-10-CM | POA: Diagnosis not present

## 2020-05-26 DIAGNOSIS — M869 Osteomyelitis, unspecified: Secondary | ICD-10-CM | POA: Diagnosis not present

## 2020-05-26 DIAGNOSIS — Z96642 Presence of left artificial hip joint: Secondary | ICD-10-CM | POA: Diagnosis not present

## 2020-05-26 DIAGNOSIS — M1611 Unilateral primary osteoarthritis, right hip: Secondary | ICD-10-CM | POA: Diagnosis not present

## 2020-05-26 DIAGNOSIS — Z7982 Long term (current) use of aspirin: Secondary | ICD-10-CM | POA: Diagnosis not present

## 2020-05-26 DIAGNOSIS — Z89422 Acquired absence of other left toe(s): Secondary | ICD-10-CM | POA: Diagnosis not present

## 2020-05-30 DIAGNOSIS — M1611 Unilateral primary osteoarthritis, right hip: Secondary | ICD-10-CM | POA: Diagnosis not present

## 2020-05-30 DIAGNOSIS — Z6829 Body mass index (BMI) 29.0-29.9, adult: Secondary | ICD-10-CM | POA: Diagnosis not present

## 2020-05-30 DIAGNOSIS — Z89422 Acquired absence of other left toe(s): Secondary | ICD-10-CM | POA: Diagnosis not present

## 2020-05-30 DIAGNOSIS — Z96642 Presence of left artificial hip joint: Secondary | ICD-10-CM | POA: Diagnosis not present

## 2020-05-30 DIAGNOSIS — E1151 Type 2 diabetes mellitus with diabetic peripheral angiopathy without gangrene: Secondary | ICD-10-CM | POA: Diagnosis not present

## 2020-05-30 DIAGNOSIS — Z7982 Long term (current) use of aspirin: Secondary | ICD-10-CM | POA: Diagnosis not present

## 2020-05-30 DIAGNOSIS — Z471 Aftercare following joint replacement surgery: Secondary | ICD-10-CM | POA: Diagnosis not present

## 2020-05-30 DIAGNOSIS — E782 Mixed hyperlipidemia: Secondary | ICD-10-CM | POA: Diagnosis not present

## 2020-05-30 DIAGNOSIS — I10 Essential (primary) hypertension: Secondary | ICD-10-CM | POA: Diagnosis not present

## 2020-05-30 DIAGNOSIS — E1169 Type 2 diabetes mellitus with other specified complication: Secondary | ICD-10-CM | POA: Diagnosis not present

## 2020-05-30 DIAGNOSIS — M869 Osteomyelitis, unspecified: Secondary | ICD-10-CM | POA: Diagnosis not present

## 2020-05-30 DIAGNOSIS — Z89421 Acquired absence of other right toe(s): Secondary | ICD-10-CM | POA: Diagnosis not present

## 2020-06-02 DIAGNOSIS — Z89421 Acquired absence of other right toe(s): Secondary | ICD-10-CM | POA: Diagnosis not present

## 2020-06-02 DIAGNOSIS — Z6829 Body mass index (BMI) 29.0-29.9, adult: Secondary | ICD-10-CM | POA: Diagnosis not present

## 2020-06-02 DIAGNOSIS — E1151 Type 2 diabetes mellitus with diabetic peripheral angiopathy without gangrene: Secondary | ICD-10-CM | POA: Diagnosis not present

## 2020-06-02 DIAGNOSIS — Z96642 Presence of left artificial hip joint: Secondary | ICD-10-CM | POA: Diagnosis not present

## 2020-06-02 DIAGNOSIS — M1611 Unilateral primary osteoarthritis, right hip: Secondary | ICD-10-CM | POA: Diagnosis not present

## 2020-06-02 DIAGNOSIS — M869 Osteomyelitis, unspecified: Secondary | ICD-10-CM | POA: Diagnosis not present

## 2020-06-02 DIAGNOSIS — Z89422 Acquired absence of other left toe(s): Secondary | ICD-10-CM | POA: Diagnosis not present

## 2020-06-02 DIAGNOSIS — E782 Mixed hyperlipidemia: Secondary | ICD-10-CM | POA: Diagnosis not present

## 2020-06-02 DIAGNOSIS — I10 Essential (primary) hypertension: Secondary | ICD-10-CM | POA: Diagnosis not present

## 2020-06-02 DIAGNOSIS — E1169 Type 2 diabetes mellitus with other specified complication: Secondary | ICD-10-CM | POA: Diagnosis not present

## 2020-06-02 DIAGNOSIS — Z7982 Long term (current) use of aspirin: Secondary | ICD-10-CM | POA: Diagnosis not present

## 2020-06-02 DIAGNOSIS — Z471 Aftercare following joint replacement surgery: Secondary | ICD-10-CM | POA: Diagnosis not present

## 2020-06-05 ENCOUNTER — Encounter: Payer: Self-pay | Admitting: Orthopaedic Surgery

## 2020-06-05 ENCOUNTER — Ambulatory Visit (INDEPENDENT_AMBULATORY_CARE_PROVIDER_SITE_OTHER): Payer: BC Managed Care – PPO | Admitting: Orthopaedic Surgery

## 2020-06-05 DIAGNOSIS — Z96642 Presence of left artificial hip joint: Secondary | ICD-10-CM

## 2020-06-05 MED ORDER — OXYCODONE HCL 5 MG PO TABS
5.0000 mg | ORAL_TABLET | Freq: Four times a day (QID) | ORAL | 0 refills | Status: DC | PRN
Start: 2020-06-05 — End: 2020-08-19

## 2020-06-05 NOTE — Progress Notes (Signed)
The patient is 2 weeks tomorrow status post a left total hip arthroplasty.  He reports good strength and good range of motion.  He is ambling with a cane.  He is only taking pain medication at night to help him sleep.  On exam his left hip looks good.  Remove the staples in place Steri-Strips.  There was about 40 cc of seroma that I drained off the head.  His ligaments appear equal.  He will continue to increase his activities as comfort allows.  I will send in 1 more prescription for pain medication.  We will see him back in 4 weeks to see how he is doing overall.  All questions and concerns were answered and addressed.

## 2020-06-06 DIAGNOSIS — M869 Osteomyelitis, unspecified: Secondary | ICD-10-CM | POA: Diagnosis not present

## 2020-06-06 DIAGNOSIS — Z471 Aftercare following joint replacement surgery: Secondary | ICD-10-CM | POA: Diagnosis not present

## 2020-06-06 DIAGNOSIS — Z7982 Long term (current) use of aspirin: Secondary | ICD-10-CM | POA: Diagnosis not present

## 2020-06-06 DIAGNOSIS — E782 Mixed hyperlipidemia: Secondary | ICD-10-CM | POA: Diagnosis not present

## 2020-06-06 DIAGNOSIS — Z89422 Acquired absence of other left toe(s): Secondary | ICD-10-CM | POA: Diagnosis not present

## 2020-06-06 DIAGNOSIS — Z96642 Presence of left artificial hip joint: Secondary | ICD-10-CM | POA: Diagnosis not present

## 2020-06-06 DIAGNOSIS — E1151 Type 2 diabetes mellitus with diabetic peripheral angiopathy without gangrene: Secondary | ICD-10-CM | POA: Diagnosis not present

## 2020-06-06 DIAGNOSIS — Z6829 Body mass index (BMI) 29.0-29.9, adult: Secondary | ICD-10-CM | POA: Diagnosis not present

## 2020-06-06 DIAGNOSIS — M1611 Unilateral primary osteoarthritis, right hip: Secondary | ICD-10-CM | POA: Diagnosis not present

## 2020-06-06 DIAGNOSIS — E1169 Type 2 diabetes mellitus with other specified complication: Secondary | ICD-10-CM | POA: Diagnosis not present

## 2020-06-06 DIAGNOSIS — I10 Essential (primary) hypertension: Secondary | ICD-10-CM | POA: Diagnosis not present

## 2020-06-06 DIAGNOSIS — Z89421 Acquired absence of other right toe(s): Secondary | ICD-10-CM | POA: Diagnosis not present

## 2020-06-19 ENCOUNTER — Telehealth: Payer: Self-pay | Admitting: Orthopaedic Surgery

## 2020-06-19 NOTE — Telephone Encounter (Signed)
Received a call from patient. He needs the work letter from 8/5 refaxed to [place], attn: Brennan Bailey. I faxed [phone], claim number [id number]

## 2020-07-03 ENCOUNTER — Ambulatory Visit (INDEPENDENT_AMBULATORY_CARE_PROVIDER_SITE_OTHER): Payer: BC Managed Care – PPO | Admitting: Orthopaedic Surgery

## 2020-07-03 ENCOUNTER — Encounter: Payer: Self-pay | Admitting: Orthopaedic Surgery

## 2020-07-03 DIAGNOSIS — Z96642 Presence of left artificial hip joint: Secondary | ICD-10-CM

## 2020-07-03 NOTE — Progress Notes (Signed)
The patient is now 6 weeks status post a left total hip arthroplasty.  He says he is doing well.  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  He is scheduled to return to work November 8.  Examination of his left operative hip shows that is less stiff than it was before.  He still actually has some right hip stiffness.  His leg lengths are equal.  We talked about his balance and coronation and he is going to get back into the gym to work on quad and hip strengthening exercises.  We will allow him to return to work starting 8 November but for the first 2 weeks I like him to work only up to 8-hour shifts.  We will see him back in 6 months to see how is doing overall or sooner if there is any issues.  At his 52-month follow-up visit I like a standing AP pelvis and lateral of his left operative hip.

## 2020-07-22 ENCOUNTER — Ambulatory Visit: Payer: Self-pay

## 2020-07-22 ENCOUNTER — Encounter: Payer: Self-pay | Admitting: Family

## 2020-07-22 ENCOUNTER — Ambulatory Visit (INDEPENDENT_AMBULATORY_CARE_PROVIDER_SITE_OTHER): Payer: BC Managed Care – PPO | Admitting: Family

## 2020-07-22 VITALS — Ht 72.0 in | Wt 220.0 lb

## 2020-07-22 DIAGNOSIS — M79672 Pain in left foot: Secondary | ICD-10-CM

## 2020-07-22 NOTE — Progress Notes (Signed)
Office Visit Note   Patient: Anthony Park           Date of Birth: [birth year]           MRN: [id number] Visit Date: 07/22/2020              Requested by: No referring provider defined for this encounter. PCP: Patient, No Pcp Per  Chief Complaint  Patient presents with  . Left Foot - Pain      HPI: The patient is a 59 year old gentleman seen today for initial evaluation of left lateral foot pain with some mild associated swelling. He has recently recovered from a left total hip arthroplasty with Dr. Magnus Ivan.  He had returned to work just yesterday.  Reports that Saturday he did quite a bit of yard work his foot felt sore at the end of the day then on Sunday he had to walk up and down 9 flights of stairs which caused him significant discomfort of the left foot especially along the lateral column did not roll his ankle or foot had no injury that he can recall.  Also did return to work yesterday but was unable to complete his full 8-hour shift.  Have to leave/stop working after about 4 hours due to pain.  He is wondering if his new steel toes might be too tight.  He did use some of his oxycodone left over from his hip surgery to help him to rest last night    Assessment & Plan: Visit Diagnoses:  1. Pain in left foot     Plan: We will treat for fracture of the left foot placed in a postop shoe have provided a note that he remain out of work for 2 more weeks.  He will follow-up in the office in 2 weeks with radiographs of the left foot  Follow-Up Instructions: Return in about 13 days (around 08/04/2020).   Ortho Exam  Patient is alert, oriented, no adenopathy, well-dressed, normal affect, normal respiratory effort. On examination of the left foot there is no appreciable edema however he is tender along the lateral column, fifth metatarsal.  Does have a palpable dorsalis pedis pulse there is no ecchymosis  Imaging: No results found. No images are attached to the  encounter.  Labs: Lab Results  Component Value Date   HGBA1C 6.8 (H) 05/20/2020   HGBA1C 6.7 (H) 11/05/2018     Lab Results  Component Value Date   ALBUMIN 3.5 11/06/2018    No results found for: MG No results found for: VD25OH  No results found for: PREALBUMIN CBC EXTENDED Latest Ref Rng & Units 05/20/2020 11/06/2018 11/05/2018  WBC 4.0 - 10.5 K/uL 6.7 6.4 6.8  RBC 4.22 - 5.81 MIL/uL 4.95 4.70 5.26  HGB 13.0 - 17.0 g/dL 85.6 31.4 97.0  HCT 39 - 52 % 45.4 41.2 46.6  PLT 150 - 400 K/uL 205 161 170     Body mass index is 29.84 kg/m.  Orders:  Orders Placed This Encounter  Procedures  . XR Foot 2 Views Left   No orders of the defined types were placed in this encounter.    Procedures: No procedures performed  Clinical Data: No additional findings.  ROS:  All other systems negative, except as noted in the HPI. Review of Systems  Constitutional: Negative for chills and fever.  Musculoskeletal: Positive for arthralgias and gait problem. Negative for joint swelling and myalgias.  Neurological: Negative for weakness and numbness.    Objective:  Vital Signs: Ht 6' (1.829 m)   Wt 220 lb (99.8 kg)   BMI 29.84 kg/m   Specialty Comments:  No specialty comments available.  PMFS History: Patient Active Problem List   Diagnosis Date Noted  . Status post total replacement of left hip 06/05/2020  . Unilateral primary osteoarthritis, right hip 04/17/2020  . Unilateral primary osteoarthritis, left hip 04/17/2020  . Amputated toe, right (HCC) 11/22/2018  . Type 2 diabetes mellitus with foot ulcer, without long-term current use of insulin (HCC) 11/22/2018  . Essential hypertension 11/22/2018  . Mixed hyperlipidemia 11/22/2018  . Morbid obesity (HCC) 11/22/2018  . Aneurysm of left popliteal artery (HCC) 11/22/2018  . Status post peripheral artery angioplasty with insertion of stent 11/22/2018  . Encounter for smoking cessation counseling 11/22/2018  . Osteomyelitis of  third toe of right foot (HCC)   . Cellulitis of third toe of right foot    Past Medical History:  Diagnosis Date  . Arthritis   . Diabetes mellitus without complication (HCC)    type 2 controlled with diet   . Diabetic toe ulcer (HCC) 11/05/2018  . Hyperlipidemia   . Hypertension   . Peripheral vascular disease (HCC)    stent in left leg due to anerysm     Family History  Problem Relation Age of Onset  . COPD Mother   . Cataracts Mother   . Brain cancer Father   . Hypertension Brother     Past Surgical History:  Procedure Laterality Date  . AMPUTATION Right 11/07/2018   Procedure: RIGHT 3RD TOE AMPUTATION;  Surgeon: Nadara Mustard, MD;  Location: [facility] [facility];  Service: Orthopedics;  Laterality: Right;  . AMPUTATION TOE Left    big toe and one next to it  . CATARACT EXTRACTION Bilateral   . TOE AMPUTATION Left 2015  . TOTAL HIP ARTHROPLASTY Left 05/23/2020   Procedure: LEFT TOTAL HIP ARTHROPLASTY ANTERIOR APPROACH;  Surgeon: Kathryne Hitch, MD;  Location: [facility];  Service: Orthopedics;  Laterality: Left;   Social History   Occupational History  . Not on file  Tobacco Use  . Smoking status: Current Every Day Smoker    Packs/day: 0.50    Types: Cigarettes  . Smokeless tobacco: Never Used  Vaping Use  . Vaping Use: Never used  Substance and Sexual Activity  . Alcohol use: Yes    Alcohol/week: 7.0 standard drinks    Types: 7 Shots of liquor per week    Comment: 7 per week  . Drug use: Not Currently    Comment: hx of marijuana 15 years ago   . Sexual activity: Yes

## 2020-07-23 ENCOUNTER — Telehealth: Payer: Self-pay | Admitting: Orthopaedic Surgery

## 2020-07-23 NOTE — Telephone Encounter (Signed)
Records 05/14/20 - present faxed to [facility] [phone], attn: Wynona Canes, ph [phone]

## 2020-07-29 ENCOUNTER — Ambulatory Visit: Payer: Self-pay

## 2020-07-29 ENCOUNTER — Ambulatory Visit (INDEPENDENT_AMBULATORY_CARE_PROVIDER_SITE_OTHER): Payer: BC Managed Care – PPO | Admitting: Family

## 2020-07-29 ENCOUNTER — Encounter: Payer: Self-pay | Admitting: Family

## 2020-07-29 VITALS — Ht 72.0 in | Wt 220.0 lb

## 2020-07-29 DIAGNOSIS — M79672 Pain in left foot: Secondary | ICD-10-CM | POA: Diagnosis not present

## 2020-07-29 NOTE — Progress Notes (Signed)
Office Visit Note   Patient: Anthony Park           Date of Birth: [birth year]           MRN: [id number] Visit Date: 07/29/2020              Requested by: No referring provider defined for this encounter. PCP: Patient, No Pcp Per  Chief Complaint  Patient presents with  . Left Foot - Follow-up      HPI: The patient is a 59 year old gentleman seen today in follow up for left lateral foot pain with some mild associated swelling. Has been out of work for last 2 weeks for the foot pain.  He has recently recovered from a left total hip arthroplasty with Dr. Magnus Ivan.   Reports also in the last week stubbing his toe on furniture. Has bruising and discoloration to 3rd and 5th toes. Pain free.   Overall feels is doing better.  Assessment & Plan: Visit Diagnoses:  1. Pain in left foot     Plan: Peroneal tendonitis on left. Patient would like [place] to check for gout. Will draw today. He will continue with close surveillance of toe bruising.   Follow-Up Instructions: Return if symptoms worsen or fail to improve.   Ortho Exam  Patient is alert, oriented, no adenopathy, well-dressed, normal affect, normal respiratory effort. On examination of the left foot there is no appreciable edema however he is tender to base of fifth metatarsal and course of peroneal tendon.  Does have a palpable dorsalis pedis pulse there is no ecchymosis or erythema along lateral column. Does have ecchymosis to 3rd to distal tip and lateral 5th toe from trauma. No open wound. No erythema warmth or drainage.  Imaging: No results found. No images are attached to the encounter.  Labs: Lab Results  Component Value Date   HGBA1C 6.8 (H) 05/20/2020   HGBA1C 6.7 (H) 11/05/2018     Lab Results  Component Value Date   ALBUMIN 3.5 11/06/2018    No results found for: MG No results found for: VD25OH  No results found for: PREALBUMIN CBC EXTENDED Latest Ref Rng & Units 05/20/2020 11/06/2018 11/05/2018  WBC 4.0  - 10.5 K/uL 6.7 6.4 6.8  RBC 4.22 - 5.81 MIL/uL 4.95 4.70 5.26  HGB 13.0 - 17.0 g/dL 73.5 32.9 92.4  HCT 39 - 52 % 45.4 41.2 46.6  PLT 150 - 400 K/uL 205 161 170     Body mass index is 29.84 kg/m.  Orders:  Orders Placed This Encounter  Procedures  . XR Foot 2 Views Left  . Uric acid   No orders of the defined types were placed in this encounter.    Procedures: No procedures performed  Clinical Data: No additional findings.  ROS:  All other systems negative, except as noted in the HPI. Review of Systems  Constitutional: Negative for chills and fever.  Musculoskeletal: Positive for arthralgias and gait problem. Negative for joint swelling and myalgias.  Neurological: Negative for weakness and numbness.    Objective: Vital Signs: Ht 6' (1.829 m)   Wt 220 lb (99.8 kg)   BMI 29.84 kg/m   Specialty Comments:  No specialty comments available.  PMFS History: Patient Active Problem List   Diagnosis Date Noted  . Status post total replacement of left hip 06/05/2020  . Unilateral primary osteoarthritis, right hip 04/17/2020  . Unilateral primary osteoarthritis, left hip 04/17/2020  . Amputated toe, right (HCC) 11/22/2018  .  Type 2 diabetes mellitus with foot ulcer, without long-term current use of insulin (HCC) 11/22/2018  . Essential hypertension 11/22/2018  . Mixed hyperlipidemia 11/22/2018  . Morbid obesity (HCC) 11/22/2018  . Aneurysm of left popliteal artery (HCC) 11/22/2018  . Status post peripheral artery angioplasty with insertion of stent 11/22/2018  . Encounter for smoking cessation counseling 11/22/2018  . Osteomyelitis of third toe of right foot (HCC)   . Cellulitis of third toe of right foot    Past Medical History:  Diagnosis Date  . Arthritis   . Diabetes mellitus without complication (HCC)    type 2 controlled with diet   . Diabetic toe ulcer (HCC) 11/05/2018  . Hyperlipidemia   . Hypertension   . Peripheral vascular disease (HCC)    stent in  left leg due to anerysm     Family History  Problem Relation Age of Onset  . COPD Mother   . Cataracts Mother   . Brain cancer Father   . Hypertension Brother     Past Surgical History:  Procedure Laterality Date  . AMPUTATION Right 11/07/2018   Procedure: RIGHT 3RD TOE AMPUTATION;  Surgeon: Nadara Mustard, MD;  Location: [facility] OR;  Service: Orthopedics;  Laterality: Right;  . AMPUTATION TOE Left    big toe and one next to it  . CATARACT EXTRACTION Bilateral   . TOE AMPUTATION Left 2015  . TOTAL HIP ARTHROPLASTY Left 05/23/2020   Procedure: LEFT TOTAL HIP ARTHROPLASTY ANTERIOR APPROACH;  Surgeon: Kathryne Hitch, MD;  Location: [facility] ORS;  Service: Orthopedics;  Laterality: Left;   Social History   Occupational History  . Not on file  Tobacco Use  . Smoking status: Current Every Day Smoker    Packs/day: 0.50    Types: Cigarettes  . Smokeless tobacco: Never Used  Vaping Use  . Vaping Use: Never used  Substance and Sexual Activity  . Alcohol use: Yes    Alcohol/week: 7.0 standard drinks    Types: 7 Shots of liquor per week    Comment: 7 per week  . Drug use: Not Currently    Comment: hx of marijuana 15 years ago   . Sexual activity: Yes

## 2020-07-30 LAB — URIC ACID: Uric Acid, Serum: 6.9 mg/dL (ref 4.0–8.0)

## 2020-07-30 NOTE — Progress Notes (Signed)
Uric acid normal, can you fyi him

## 2020-07-30 NOTE — Progress Notes (Signed)
I called pt and lm on vm to advise of result. To call with questions.

## 2020-08-03 ENCOUNTER — Emergency Department (HOSPITAL_COMMUNITY)
Admission: EM | Admit: 2020-08-03 | Discharge: 2020-08-03 | Disposition: A | Discharge status: Home / Self Care | Payer: BC Managed Care – PPO | Attending: Emergency Medicine | Admitting: Emergency Medicine

## 2020-08-03 ENCOUNTER — Encounter (HOSPITAL_COMMUNITY): Payer: Self-pay | Admitting: Emergency Medicine

## 2020-08-03 DIAGNOSIS — Z7982 Long term (current) use of aspirin: Secondary | ICD-10-CM | POA: Insufficient documentation

## 2020-08-03 DIAGNOSIS — I10 Essential (primary) hypertension: Secondary | ICD-10-CM | POA: Diagnosis not present

## 2020-08-03 DIAGNOSIS — L97529 Non-pressure chronic ulcer of other part of left foot with unspecified severity: Secondary | ICD-10-CM | POA: Diagnosis not present

## 2020-08-03 DIAGNOSIS — Z96642 Presence of left artificial hip joint: Secondary | ICD-10-CM | POA: Diagnosis not present

## 2020-08-03 DIAGNOSIS — E11621 Type 2 diabetes mellitus with foot ulcer: Secondary | ICD-10-CM | POA: Diagnosis not present

## 2020-08-03 DIAGNOSIS — Z79899 Other long term (current) drug therapy: Secondary | ICD-10-CM | POA: Diagnosis not present

## 2020-08-03 DIAGNOSIS — M79672 Pain in left foot: Secondary | ICD-10-CM | POA: Diagnosis not present

## 2020-08-03 DIAGNOSIS — F17210 Nicotine dependence, cigarettes, uncomplicated: Secondary | ICD-10-CM | POA: Diagnosis not present

## 2020-08-03 DIAGNOSIS — I739 Peripheral vascular disease, unspecified: Secondary | ICD-10-CM

## 2020-08-03 LAB — BASIC METABOLIC PANEL
Anion gap: 15 (ref 5–15)
BUN: 5 mg/dL — ABNORMAL LOW (ref 6–20)
CO2: 23 mmol/L (ref 22–32)
Calcium: 9 mg/dL (ref 8.9–10.3)
Chloride: 97 mmol/L — ABNORMAL LOW (ref 98–111)
Creatinine, Ser: 0.72 mg/dL (ref 0.61–1.24)
GFR, Estimated: >60 mL/min (ref >60)
Glucose, Bld: 134 mg/dL — ABNORMAL HIGH (ref 70–99)
Potassium: 3.3 mmol/L — ABNORMAL LOW (ref 3.5–5.1)
Sodium: 135 mmol/L (ref 135–145)

## 2020-08-03 LAB — CBC WITH DIFFERENTIAL/PLATELET
Abs Immature Granulocytes: 0.05 10*3/uL (ref 0.00–0.07)
Basophils Absolute: 0.1 10*3/uL (ref 0.0–0.1)
Basophils Relative: 1 %
Eosinophils Absolute: 0 10*3/uL (ref 0.0–0.5)
Eosinophils Relative: 0 %
HCT: 43.5 % (ref 39.0–52.0)
Hemoglobin: 14.6 g/dL (ref 13.0–17.0)
Immature Granulocytes: 0 %
Lymphocytes Relative: 17 %
Lymphs Abs: 2.3 10*3/uL (ref 0.7–4.0)
MCH: 30.2 pg (ref 26.0–34.0)
MCHC: 33.6 g/dL (ref 30.0–36.0)
MCV: 89.9 fL (ref 80.0–100.0)
Monocytes Absolute: 0.9 10*3/uL (ref 0.1–1.0)
Monocytes Relative: 7 %
Neutro Abs: 9.7 10*3/uL — ABNORMAL HIGH (ref 1.7–7.7)
Neutrophils Relative %: 75 %
Platelets: 277 10*3/uL (ref 150–400)
RBC: 4.84 MIL/uL (ref 4.22–5.81)
RDW: 12.4 % (ref 11.5–15.5)
WBC: 13 10*3/uL — ABNORMAL HIGH (ref 4.0–10.5)
nRBC: 0 % (ref 0.0–0.2)

## 2020-08-03 NOTE — ED Notes (Signed)
Pedal pulses obtained with doppler.

## 2020-08-03 NOTE — ED Triage Notes (Signed)
Pt states for 4 days he has had increased pain in left foot with redness and blueness in toes. Pts left foot is cool to touch unable to palpate pedal pulses in triage. Pt has history of toe amputation to this same foot and hx of stent place to this leg. He is able to walk but causing extreme pain.

## 2020-08-03 NOTE — ED Provider Notes (Signed)
[facility] EMERGENCY DEPARTMENT Provider Note   CSN: [id number] Arrival date & time: 08/03/20  1430     History Chief Complaint  Patient presents with  . Circulatory Problem    Anthony Park is a 59 y.o. male.  HPI   Sx started a couple of weeks ago.  He had gone back to work and was not able to work a full day.  He ended up seeing an orthopedic doctor because he thought he broke something.  Pt was told to rest from work for a couple of weeks.  He noticed it started to feel better.  About a week ago he hit his foot on the bed post and developed two blood blisters.  He had another xray at [facility]o care and was told nothing was broken.  Pt also had test for gout which reportedly were unremarkable. Pt has noticed increasing pain with getting up in the am and it starts to get better.  Pt does have history of PVD and has a stent in that leg.  He has noticed redness in the foot and blue discoloration but that started after the initial injury.  Past Medical History:  Diagnosis Date  . Arthritis   . Diabetes mellitus without complication (HCC)    type 2 controlled with diet   . Diabetic toe ulcer (HCC) 11/05/2018  . Hyperlipidemia   . Hypertension   . Peripheral vascular disease (HCC)    stent in left leg due to anerysm     Patient Active Problem List   Diagnosis Date Noted  . Status post total replacement of left hip 06/05/2020  . Unilateral primary osteoarthritis, right hip 04/17/2020  . Unilateral primary osteoarthritis, left hip 04/17/2020  . Amputated toe, right (HCC) 11/22/2018  . Type 2 diabetes mellitus with foot ulcer, without long-term current use of insulin (HCC) 11/22/2018  . Essential hypertension 11/22/2018  . Mixed hyperlipidemia 11/22/2018  . Morbid obesity (HCC) 11/22/2018  . Aneurysm of left popliteal artery (HCC) 11/22/2018  . Status post peripheral artery angioplasty with insertion of stent 11/22/2018  . Encounter for smoking cessation  counseling 11/22/2018  . Osteomyelitis of third toe of right foot (HCC)   . Cellulitis of third toe of right foot     Past Surgical History:  Procedure Laterality Date  . AMPUTATION Right 11/07/2018   Procedure: RIGHT 3RD TOE AMPUTATION;  Surgeon: Nadara Mustard, MD;  Location: [facility] OR;  Service: Orthopedics;  Laterality: Right;  . AMPUTATION TOE Left    big toe and one next to it  . CATARACT EXTRACTION Bilateral   . TOE AMPUTATION Left 2015  . TOTAL HIP ARTHROPLASTY Left 05/23/2020   Procedure: LEFT TOTAL HIP ARTHROPLASTY ANTERIOR APPROACH;  Surgeon: Kathryne Hitch, MD;  Location: [facility] ORS;  Service: Orthopedics;  Laterality: Left;       Family History  Problem Relation Age of Onset  . COPD Mother   . Cataracts Mother   . Brain cancer Father   . Hypertension Brother     Social History   Tobacco Use  . Smoking status: Current Every Day Smoker    Packs/day: 0.50    Types: Cigarettes  . Smokeless tobacco: Never Used  Vaping Use  . Vaping Use: Never used  Substance Use Topics  . Alcohol use: Yes    Alcohol/week: 7.0 standard drinks    Types: 7 Shots of liquor per week    Comment: 7 per week  . Drug use: Not Currently  Comment: hx of marijuana 15 years ago     Home Medications Prior to Admission medications   Medication Sig Start Date End Date Taking? Authorizing Provider  ascorbic acid (VITAMIN C) 500 MG tablet Take 500 mg by mouth daily.   Yes [provider]  cholecalciferol (VITAMIN D3) 25 MCG (1000 UNIT) tablet Take 1,000 Units by mouth daily.   Yes [provider]  GARLIC PO Take 1 tablet by mouth daily.   Yes [provider]  lisinopril (ZESTRIL) 20 MG tablet TAKE 1 TABLET BY MOUTH EVERY DAY Patient taking differently: Take 20 mg by mouth daily.  07/20/19  Yes Doren Custard, FNP  methocarbamol (ROBAXIN) 500 MG tablet Take 1 tablet (500 mg total) by mouth every 6 (six) hours as needed for muscle spasms. 05/22/20  Yes Kathryne Hitch, MD  Multiple Vitamins-Minerals (MULTIVITAMIN WITH MINERALS) tablet Take 1 tablet by mouth daily.   Yes [provider]  oxyCODONE (ROXICODONE) 5 MG immediate release tablet Take 1 tablet (5 mg total) by mouth every 6 (six) hours as needed for severe pain. 06/05/20  Yes Kathryne Hitch, MD  Turmeric 500 MG CAPS Take 500 mg by mouth daily.   Yes [provider]  aspirin EC 81 MG tablet Take 1 tablet (81 mg total) by mouth 2 (two) times daily after a meal. Swallow whole. Patient not taking: Reported on 08/03/2020 05/22/20   Kathryne Hitch, MD  ondansetron (ZOFRAN ODT) 4 MG disintegrating tablet Take 1 tablet (4 mg total) by mouth every 8 (eight) hours as needed for nausea or vomiting. Patient not taking: Reported on 08/03/2020 05/22/20   Kathryne Hitch, MD    Allergies    Patient has no known allergies.  Review of Systems   Review of Systems  All other systems reviewed and are negative.   Physical Exam Updated Vital Signs BP (!) 171/108   Pulse 97   Temp 98.5 F (36.9 C) (Oral)   Resp (!) 22   SpO2 99%   Physical Exam Vitals and nursing note reviewed.  Constitutional:      General: He is not in acute distress.    Appearance: He is well-developed.  HENT:     Head: Normocephalic and atraumatic.     Right Ear: External ear normal.     Left Ear: External ear normal.  Eyes:     General: No scleral icterus.       Right eye: No discharge.        Left eye: No discharge.     Conjunctiva/sclera: Conjunctivae normal.  Neck:     Trachea: No tracheal deviation.  Cardiovascular:     Rate and Rhythm: Normal rate and regular rhythm.  Pulmonary:     Effort: Pulmonary effort is normal. No respiratory distress.     Breath sounds: Normal breath sounds. No stridor. No wheezing or rales.  Abdominal:     General: Bowel sounds are normal. There is no distension.     Palpations: Abdomen is soft.     Tenderness: There is no abdominal  tenderness. There is no guarding or rebound.  Musculoskeletal:        General: Swelling and tenderness present. No deformity.     Cervical back: Neck supple.     Comments: ttp left midfoot and toes, bruising noted on toes with black discoloration to the tip of third toe, s/p amputation of first and second toes, erythema and ttp midfoot  Skin:  General: Skin is warm and dry.     Findings: No rash.  Neurological:     Mental Status: He is alert.     Cranial Nerves: Cranial nerve deficit: no gross deficits.     Sensory: No sensory deficit.     Motor: No abnormal muscle tone or seizure activity.     Coordination: Coordination normal.         ED Results / Procedures / Treatments   Labs (all labs ordered are listed, but only abnormal results are displayed) Labs Reviewed  BASIC METABOLIC PANEL - Abnormal; Notable for the following components:      Result Value   Potassium 3.3 (*)    Chloride 97 (*)    Glucose, Bld 134 (*)    BUN 5 (*)    All other components within normal limits  CBC WITH DIFFERENTIAL/PLATELET    EKG None  Radiology No results found. Reviewed plain films from 11/16.  No fx noted Procedures Procedures (including critical care time)  Medications Ordered in ED Medications - No data to display  ED Course  I have reviewed the triage vital signs and the nursing notes.  Pertinent labs & imaging results that were available during my care of the patient were reviewed by me and considered in my medical decision making (see chart for details).  Clinical Course as of Aug 03 1730  Wynelle Link Aug 03, 2020  1701 Patient was seen by Dr. Durwin Nora.  He feels the patient will require an angiogram.  Patient does not want to be admitted to the hospital have that done.  Plan will be to have him follow-up in the vascular surgery office   [JK]    Clinical Course User Index [JK] Linwood Dibbles, MD   MDM Rules/Calculators/A&P                         Patient presents with persistent  and increasing foot pain.  Symptoms initially started after trauma.  Blue discoloration could be consistent with hematoma and ecchymoses however the patient does have peripheral vascular disease and stenting of that extremity.  Foot is cool in comparison to the other foot.  Concerning for component of vascular occlusion although pedal pulses were obtained with doppler.  Will check labs. Discussed case with vascular surgery, Dr Edilia Bo.  He will evaluate patient.  Patient seen by Dr. Durwin Nora.  Patient does have evidence of arterial occlusive disease.  He doubts acute occlusion but does suspect some chronic occlusion issues.  He recommended arteriography.  He offered admission to the hospital for further evaluation to include an angiogram.  Patient did not want to be admitted to the hospital.  Pt's labs were still pending when he decided to leave.  The metabolic panel was back but CBC was not back.  Pt also noted to be hypertensive but again he left before we were able to address that as well.  Final Clinical Impression(s) / ED Diagnoses Final diagnoses:  PVD (peripheral vascular disease) (HCC)    Rx / DC Orders ED Discharge Orders    None       Linwood Dibbles, MD 08/03/20 1734

## 2020-08-03 NOTE — ED Notes (Addendum)
Patient removed self from monitor. Stating he doesn't want to wait for lab results. Dr. Lynelle Doctor made aware. Patient educated that he will be able to see his results in chart but ED MD will not be able to call patient with results. Patient states "ok then wheel me to the front." Patient escorted out of ED via wc.

## 2020-08-03 NOTE — Consult Note (Signed)
REASON FOR CONSULT:    Left foot pain with peripheral vascular disease.  The consult is requested by Dr. Lynelle Doctor.  ASSESSMENT & PLAN:   ISCHEMIC LEFT FOOT WITH PERIPHERAL VASCULAR DISEASE: This patient has evidence of infrainguinal arterial occlusive disease now with wounds on the left foot as shown below in the photograph.  I think this could become a limb threatening problem.  Based on his history it sounds like he had some underlying peripheral vascular disease and injured his foot when he stubbed it on a bedpost.  I do not get any history which would suggest sudden occlusion of his popliteal stent although I suspect this may be chronically occluded given the change in Dopplers findings compared to his noninvasive study in February 2020.  I have recommended arteriography to further assess his infrainguinal arterial occlusive disease.  I offered to admit him to the hospital with plans to potentially do the dye study late Monday or Tuesday.  This would also allow [place] to obtain a medical consult to evaluate his diabetes and hypertension.  He feels quite strongly about not staying in the hospital and for this reason I will try to arrange this as an outpatient on Wednesday.  I have discussed with him the indications of the procedure and potential complications and he is agreeable to proceed.  I have discussed with him the importance of tobacco cessation.  I have also asked him to begin taking 81 mg of aspirin daily as he tells me he had not been taking aspirin.  Waverly Ferrari, MD Office: [phone]   HPI:   GODFREY TRITSCHLER is a pleasant 59 y.o. male, who developed the gradual onset of left foot pain 2 to 3 weeks ago.  A week ago while going to the bathroom at night he stubbed his left foot on the bedpost and has had more pain since then.  He has reportedly had x-rays to show that there is no fracture.  However he recently had his hip replaced 12 weeks ago and is gone back to work and was having  significant pain in his foot.  He presented to the emergency department tonight for further evaluation.  The patient had placement of a left popliteal artery stent at [facility] by Dr. Wyn Quaker on 02/13/2014.  I reviewed the operative note.  He had an 8 mm x 25 cm Viabahn stent placed from Hunter's canal down to the distal popliteal just above the takeoff of the anterior tibial artery.   Prior to developing foot pain the patient denied any calf claudication.  He denied any rest pain previously.  He had been having significant left hip pain and underwent a left total hip replacement approximately 12 weeks ago.  He is done well from that standpoint and has had no hip pain since the surgery.  His risk factors for peripheral vascular disease include diabetes, hypertension, and tobacco use.  He smokes a pack per day and has been smoking since he was 59 years old.  He denies any history of hypercholesterolemia or family history of premature cardiovascular disease.  Of note he does not have a primary care physician so has not been on any medications for his diabetes or hypertension according to the patient.  Of note he is previously had amputations of the right third toe and left first and second toes.  Past Medical History:  Diagnosis Date  . Arthritis   . Diabetes mellitus without complication (HCC)    type 2  controlled with diet   . Diabetic toe ulcer (HCC) 11/05/2018  . Hyperlipidemia   . Hypertension   . Peripheral vascular disease (HCC)    stent in left leg due to anerysm     Family History  Problem Relation Age of Onset  . COPD Mother   . Cataracts Mother   . Brain cancer Father   . Hypertension Brother     SOCIAL HISTORY: Social History   Socioeconomic History  . Marital status: Single    Spouse name: [name]  . Number of children: 3  . Years of education: 1  . Highest education level: Not on file  Occupational History  . Not on file  Tobacco Use  . Smoking  status: Current Every Day Smoker    Packs/day: 0.50    Types: Cigarettes  . Smokeless tobacco: Never Used  Vaping Use  . Vaping Use: Never used  Substance and Sexual Activity  . Alcohol use: Yes    Alcohol/week: 7.0 standard drinks    Types: 7 Shots of liquor per week    Comment: 7 per week  . Drug use: Not Currently    Comment: hx of marijuana 15 years ago   . Sexual activity: Yes  Other Topics Concern  . Not on file  Social History Narrative  . Not on file   Social Determinants of Health   Financial Resource Strain:   . Difficulty of Paying Living Expenses: Not on file  Food Insecurity:   . Worried About Programme researcher, broadcasting/film/video in the Last Year: Not on file  . Ran Out of Food in the Last Year: Not on file  Transportation Needs:   . Lack of Transportation (Medical): Not on file  . Lack of Transportation (Non-Medical): Not on file  Physical Activity:   . Days of Exercise per Week: Not on file  . Minutes of Exercise per Session: Not on file  Stress:   . Feeling of Stress : Not on file  Social Connections:   . Frequency of Communication with Friends and Family: Not on file  . Frequency of Social Gatherings with Friends and Family: Not on file  . Attends Religious Services: Not on file  . Active Member of Clubs or Organizations: Not on file  . Attends Banker Meetings: Not on file  . Marital Status: Not on file  Intimate Partner Violence:   . Fear of Current or Ex-Partner: Not on file  . Emotionally Abused: Not on file  . Physically Abused: Not on file  . Sexually Abused: Not on file    No Known Allergies  No current facility-administered medications for this encounter.   Current Outpatient Medications  Medication Sig Dispense Refill  . ascorbic acid (VITAMIN C) 500 MG tablet Take 500 mg by mouth daily.    . cholecalciferol (VITAMIN D3) 25 MCG (1000 UNIT) tablet Take 1,000 Units by mouth daily.    Marland Kitchen GARLIC PO Take 1 tablet by mouth daily.    Marland Kitchen lisinopril  (ZESTRIL) 20 MG tablet TAKE 1 TABLET BY MOUTH EVERY DAY (Patient taking differently: Take 20 mg by mouth daily. ) 90 tablet 1  . methocarbamol (ROBAXIN) 500 MG tablet Take 1 tablet (500 mg total) by mouth every 6 (six) hours as needed for muscle spasms. 40 tablet 1  . Multiple Vitamins-Minerals (MULTIVITAMIN WITH MINERALS) tablet Take 1 tablet by mouth daily.    Marland Kitchen oxyCODONE (ROXICODONE) 5 MG immediate release tablet Take 1 tablet (5 mg total) by  mouth every 6 (six) hours as needed for severe pain. 30 tablet 0  . Turmeric 500 MG CAPS Take 500 mg by mouth daily.    Marland Kitchen. aspirin EC 81 MG tablet Take 1 tablet (81 mg total) by mouth 2 (two) times daily after a meal. Swallow whole. (Patient not taking: Reported on 08/03/2020) 30 tablet 0  . ondansetron (ZOFRAN ODT) 4 MG disintegrating tablet Take 1 tablet (4 mg total) by mouth every 8 (eight) hours as needed for nausea or vomiting. (Patient not taking: Reported on 08/03/2020) 20 tablet 0    REVIEW OF SYSTEMS:  [X]  denotes positive finding, [ ]  denotes negative finding Cardiac  Comments:  Chest pain or chest pressure:    Shortness of breath upon exertion:    Short of breath when lying flat:    Irregular heart rhythm:        Vascular    Pain in calf, thigh, or hip brought on by ambulation:    Pain in feet at night that wakes you up from your sleep:  x   Blood clot in your veins:    Leg swelling:         Pulmonary    Oxygen at home:    Productive cough:     Wheezing:         Neurologic    Sudden weakness in arms or legs:     Sudden numbness in arms or legs:     Sudden onset of difficulty speaking or slurred speech:    Temporary loss of vision in one eye:     Problems with dizziness:         Gastrointestinal    Blood in stool:     Vomited blood:         Genitourinary    Burning when urinating:     Blood in urine:        Psychiatric    Major depression:         Hematologic    Bleeding problems:    Problems with blood clotting too  easily:        Skin    Rashes or ulcers:        Constitutional    Fever or chills:     PHYSICAL EXAM:   Vitals:   08/03/20 1520 08/03/20 1530 08/03/20 1545 08/03/20 1600  BP: (!) 156/106 (!) 154/106 (!) 171/118 (!) 167/109  Pulse: 89 88 86 91  Resp: 20 (!) 21 16 15   Temp:      TempSrc:      SpO2: 97% 97% 98% 97%   GENERAL: The patient is a well-nourished male, in no acute distress. The vital signs are documented above. CARDIAC: There is a regular rate and rhythm.  VASCULAR: I do not detect carotid bruits. On the left side, which is the side of concern, he has a palpable femoral pulse.  I cannot palpate a popliteal or pedal pulses.  He has a fairly brisk but monophasic posterior tibial signal with a Doppler.  He has a monophasic dampened anterior tibial signal with the Doppler.  I cannot obtain a dorsalis pedis signal. On the right side he has a palpable femoral, popliteal, and posterior tibial pulse.  He has a brisk dorsalis pedis signal with a Doppler. PULMONARY: There is good air exchange bilaterally without wheezing or rales. ABDOMEN: Soft and non-tender with normal pitched bowel sounds.  I do not palpate an abdominal aortic aneurysm although it is difficult to assess because of  his body habitus. MUSCULOSKELETAL: There are no major deformities or cyanosis. NEUROLOGIC: No focal weakness or paresthesias are detected. SKIN:      PSYCHIATRIC: The patient has a normal affect.  DATA:    His labs are pending.  The only ABIs I can find on the patient or back in February 2020.  At that time on the right side, he had a triphasic posterior tibial signal with a monophasic dorsalis pedis signal.  ABI was 100%.  Toe pressure was 82 mmHg.  On the left side he had a triphasic posterior tibial signal with a biphasic dorsalis pedis signal.  ABI was 100%.

## 2020-08-04 ENCOUNTER — Other Ambulatory Visit: Payer: Self-pay

## 2020-08-05 ENCOUNTER — Ambulatory Visit: Payer: BC Managed Care – PPO | Admitting: Family

## 2020-08-05 ENCOUNTER — Other Ambulatory Visit (HOSPITAL_COMMUNITY)
Admission: RE | Admit: 2020-08-05 | Discharge: 2020-08-05 | Disposition: A | Discharge status: Home / Self Care | Payer: BC Managed Care – PPO | Source: Ambulatory Visit | Attending: Vascular Surgery | Admitting: Vascular Surgery

## 2020-08-05 DIAGNOSIS — I70222 Atherosclerosis of native arteries of extremities with rest pain, left leg: Secondary | ICD-10-CM | POA: Diagnosis not present

## 2020-08-05 DIAGNOSIS — E1151 Type 2 diabetes mellitus with diabetic peripheral angiopathy without gangrene: Secondary | ICD-10-CM | POA: Diagnosis not present

## 2020-08-05 DIAGNOSIS — I714 Abdominal aortic aneurysm, without rupture: Secondary | ICD-10-CM | POA: Diagnosis not present

## 2020-08-05 DIAGNOSIS — Z79899 Other long term (current) drug therapy: Secondary | ICD-10-CM | POA: Diagnosis not present

## 2020-08-05 DIAGNOSIS — F17210 Nicotine dependence, cigarettes, uncomplicated: Secondary | ICD-10-CM | POA: Diagnosis not present

## 2020-08-05 DIAGNOSIS — Z20822 Contact with and (suspected) exposure to covid-19: Secondary | ICD-10-CM | POA: Insufficient documentation

## 2020-08-05 DIAGNOSIS — Z01812 Encounter for preprocedural laboratory examination: Secondary | ICD-10-CM | POA: Insufficient documentation

## 2020-08-05 DIAGNOSIS — I10 Essential (primary) hypertension: Secondary | ICD-10-CM | POA: Diagnosis not present

## 2020-08-05 LAB — SARS CORONAVIRUS 2 (TAT 6-24 HRS): SARS Coronavirus 2: NEGATIVE

## 2020-08-06 ENCOUNTER — Ambulatory Visit (HOSPITAL_COMMUNITY)
Admission: RE | Disposition: A | Discharge status: Home / Self Care | Payer: Self-pay | Source: Home / Self Care | Attending: Vascular Surgery

## 2020-08-06 ENCOUNTER — Ambulatory Visit (HOSPITAL_COMMUNITY)
Admission: RE | Admit: 2020-08-06 | Discharge: 2020-08-06 | Disposition: A | Discharge status: Home / Self Care | Payer: BC Managed Care – PPO | Attending: Vascular Surgery | Admitting: Vascular Surgery

## 2020-08-06 DIAGNOSIS — Z79899 Other long term (current) drug therapy: Secondary | ICD-10-CM | POA: Diagnosis not present

## 2020-08-06 DIAGNOSIS — I714 Abdominal aortic aneurysm, without rupture: Secondary | ICD-10-CM | POA: Diagnosis not present

## 2020-08-06 DIAGNOSIS — E1151 Type 2 diabetes mellitus with diabetic peripheral angiopathy without gangrene: Secondary | ICD-10-CM | POA: Insufficient documentation

## 2020-08-06 DIAGNOSIS — I10 Essential (primary) hypertension: Secondary | ICD-10-CM | POA: Insufficient documentation

## 2020-08-06 DIAGNOSIS — I70222 Atherosclerosis of native arteries of extremities with rest pain, left leg: Secondary | ICD-10-CM | POA: Insufficient documentation

## 2020-08-06 DIAGNOSIS — F17210 Nicotine dependence, cigarettes, uncomplicated: Secondary | ICD-10-CM | POA: Insufficient documentation

## 2020-08-06 DIAGNOSIS — Z20822 Contact with and (suspected) exposure to covid-19: Secondary | ICD-10-CM | POA: Diagnosis not present

## 2020-08-06 DIAGNOSIS — I998 Other disorder of circulatory system: Secondary | ICD-10-CM | POA: Diagnosis not present

## 2020-08-06 HISTORY — PX: PERIPHERAL VASCULAR BALLOON ANGIOPLASTY: CATH118281

## 2020-08-06 HISTORY — PX: ABDOMINAL AORTOGRAM W/LOWER EXTREMITY: CATH118223

## 2020-08-06 LAB — POCT I-STAT, CHEM 8
BUN: 17 mg/dL (ref 6–20)
Calcium, Ion: 1.09 mmol/L — ABNORMAL LOW (ref 1.15–1.40)
Chloride: 101 mmol/L (ref 98–111)
Creatinine, Ser: 0.9 mg/dL (ref 0.61–1.24)
Glucose, Bld: 142 mg/dL — ABNORMAL HIGH (ref 70–99)
HCT: 41 % (ref 39.0–52.0)
Hemoglobin: 13.9 g/dL (ref 13.0–17.0)
Potassium: 3.5 mmol/L (ref 3.5–5.1)
Sodium: 139 mmol/L (ref 135–145)
TCO2: 25 mmol/L (ref 22–32)

## 2020-08-06 LAB — GLUCOSE, CAPILLARY: Glucose-Capillary: 140 mg/dL — ABNORMAL HIGH (ref 70–99)

## 2020-08-06 LAB — POCT ACTIVATED CLOTTING TIME
Activated Clotting Time: 208 seconds
Activated Clotting Time: 164 seconds

## 2020-08-06 SURGERY — ABDOMINAL AORTOGRAM W/LOWER EXTREMITY
Anesthesia: LOCAL

## 2020-08-06 MED ORDER — FENTANYL CITRATE (PF) 100 MCG/2ML IJ SOLN
INTRAMUSCULAR | Status: DC | PRN
  Administered 2020-08-06: 25 ug via INTRAVENOUS
  Administered 2020-08-06 (×2): 50 ug via INTRAVENOUS

## 2020-08-06 MED ORDER — NITROGLYCERIN 1 MG/10 ML FOR IR/CATH LAB
INTRA_ARTERIAL | Status: DC | PRN
  Administered 2020-08-06: 200 ug via INTRA_ARTERIAL

## 2020-08-06 MED ORDER — LABETALOL HCL 5 MG/ML IV SOLN: 10.0000 mg | INTRAVENOUS | Status: DC | PRN

## 2020-08-06 MED ORDER — SODIUM CHLORIDE 0.9 % WEIGHT BASED INFUSION: 1.0000 mL/kg/h | INTRAVENOUS | Status: DC

## 2020-08-06 MED ORDER — NITROGLYCERIN 1 MG/10 ML FOR IR/CATH LAB
INTRA_ARTERIAL | Status: AC
  Filled 2020-08-06: qty 10

## 2020-08-06 MED ORDER — LIDOCAINE HCL (PF) 1 % IJ SOLN
INTRAMUSCULAR | Status: AC
  Filled 2020-08-06: qty 30

## 2020-08-06 MED ORDER — SODIUM CHLORIDE 0.9% FLUSH: 3.0000 mL | INTRAVENOUS | Status: DC | PRN

## 2020-08-06 MED ORDER — HYDRALAZINE HCL 20 MG/ML IJ SOLN: 5.0000 mg | INTRAMUSCULAR | Status: DC | PRN

## 2020-08-06 MED ORDER — ATORVASTATIN CALCIUM 10 MG PO TABS: 10.0000 mg | ORAL_TABLET | Freq: Every day | ORAL | 11 refills | Status: DC

## 2020-08-06 MED ORDER — CLOPIDOGREL BISULFATE 75 MG PO TABS: 75.0000 mg | ORAL_TABLET | Freq: Every day | ORAL | Status: DC

## 2020-08-06 MED ORDER — CLOPIDOGREL BISULFATE 300 MG PO TABS
ORAL_TABLET | ORAL | Status: AC
  Filled 2020-08-06: qty 1

## 2020-08-06 MED ORDER — SODIUM CHLORIDE 0.9% FLUSH: 3.0000 mL | Freq: Two times a day (BID) | INTRAVENOUS | Status: DC

## 2020-08-06 MED ORDER — MIDAZOLAM HCL 2 MG/2ML IJ SOLN
INTRAMUSCULAR | Status: DC | PRN
  Administered 2020-08-06 (×2): 1 mg via INTRAVENOUS

## 2020-08-06 MED ORDER — FENTANYL CITRATE (PF) 100 MCG/2ML IJ SOLN
INTRAMUSCULAR | Status: AC
  Filled 2020-08-06: qty 2

## 2020-08-06 MED ORDER — HEPARIN SODIUM (PORCINE) 1000 UNIT/ML IJ SOLN
INTRAMUSCULAR | Status: AC
  Filled 2020-08-06: qty 1

## 2020-08-06 MED ORDER — CLOPIDOGREL BISULFATE 300 MG PO TABS
ORAL_TABLET | ORAL | Status: DC | PRN
  Administered 2020-08-06: 300 mg via ORAL

## 2020-08-06 MED ORDER — MIDAZOLAM HCL 2 MG/2ML IJ SOLN
INTRAMUSCULAR | Status: AC
  Filled 2020-08-06: qty 2

## 2020-08-06 MED ORDER — CLOPIDOGREL BISULFATE 75 MG PO TABS: 75.0000 mg | ORAL_TABLET | Freq: Every day | ORAL | 11 refills | Status: AC

## 2020-08-06 MED ORDER — ATORVASTATIN CALCIUM 10 MG PO TABS: 10.0000 mg | ORAL_TABLET | Freq: Every day | ORAL | Status: DC

## 2020-08-06 MED ORDER — SODIUM CHLORIDE 0.9 % IV SOLN: INTRAVENOUS | Status: DC

## 2020-08-06 MED ORDER — LIDOCAINE HCL (PF) 1 % IJ SOLN
INTRAMUSCULAR | Status: DC | PRN
  Administered 2020-08-06: 20 mL via INTRADERMAL

## 2020-08-06 MED ORDER — CLOPIDOGREL BISULFATE 75 MG PO TABS: 300.0000 mg | ORAL_TABLET | Freq: Once | ORAL | Status: AC

## 2020-08-06 MED ORDER — SODIUM CHLORIDE 0.9 % IV SOLN: 250.0000 mL | INTRAVENOUS | Status: DC | PRN

## 2020-08-06 MED ORDER — ACETAMINOPHEN 325 MG PO TABS: 650.0000 mg | ORAL_TABLET | ORAL | Status: DC | PRN

## 2020-08-06 MED ORDER — HEPARIN (PORCINE) IN NACL 1000-0.9 UT/500ML-% IV SOLN
INTRAVENOUS | Status: AC
  Filled 2020-08-06: qty 1000

## 2020-08-06 MED ORDER — ONDANSETRON HCL 4 MG/2ML IJ SOLN: 4.0000 mg | Freq: Four times a day (QID) | INTRAMUSCULAR | Status: DC | PRN

## 2020-08-06 SURGICAL SUPPLY — 18 items
BALLN STERLING OTW 3X220X150 (BALLOONS) ×3
BALLOON STERLING OTW 3X220X150 (BALLOONS) ×2 IMPLANT
CATH ANGIO 5F BER2 65CM (CATHETERS) ×3 IMPLANT
CATH OMNI FLUSH 5F 65CM (CATHETERS) ×3 IMPLANT
CATH QUICKCROSS .018X135CM (MICROCATHETER) ×3 IMPLANT
CATH SOFT-VU ST 4F 90CM (CATHETERS) ×3 IMPLANT
GLIDEWIRE ADV .035X180CM (WIRE) ×3 IMPLANT
KIT ENCORE 26 ADVANTAGE (KITS) ×3 IMPLANT
KIT MICROPUNCTURE NIT STIFF (SHEATH) ×3 IMPLANT
KIT PV (KITS) ×3 IMPLANT
SHEATH FLEX ANSEL ANG 6F 45CM (SHEATH) ×3 IMPLANT
SHEATH PINNACLE 5F 10CM (SHEATH) ×3 IMPLANT
SHEATH PROBE COVER 6X72 (BAG) ×3 IMPLANT
SYR MEDRAD MARK V 150ML (SYRINGE) ×3 IMPLANT
TRANSDUCER W/STOPCOCK (MISCELLANEOUS) ×3 IMPLANT
TRAY PV CATH (CUSTOM PROCEDURE TRAY) ×3 IMPLANT
WIRE BENTSON .035X145CM (WIRE) ×3 IMPLANT
WIRE G V18X300CM (WIRE) ×3 IMPLANT

## 2020-08-06 NOTE — Discharge Instructions (Signed)
° °  [facility] ° °Discharge Instructions ° °Lower Extremity Angiogram; Angioplasty/Stenting ° °Please refer to the following instructions for your post-procedure care. Your surgeon or physician assistant will discuss any changes with you. ° °Activity ° °Avoid lifting more than 8 pounds (1 gallons of milk) for 72 hours (3 days) after your procedure. You may walk as much as you can tolerate. It's OK to drive after 72 hours. ° °Bathing/Showering ° °You may shower the day after your procedure. If you have a bandage, you may remove it at 24- 48 hours. Clean your incision site with mild soap and water. Pat the area dry with a clean towel. ° °Diet ° °Resume your pre-procedure diet. There are no special food restrictions following this procedure. All patients with peripheral vascular disease should follow a low fat/low cholesterol diet. In order to heal from your surgery, it is CRITICAL to get adequate nutrition. Your body requires vitamins, minerals, and protein. Vegetables are the best source of vitamins and minerals. Vegetables also provide the perfect balance of protein. Processed food has little nutritional value, so try to avoid this. ° °Medications ° °Resume taking all of your medications unless your doctor tells you not to. If your incision is causing pain, you may take over-the-counter pain relievers such as acetaminophen (Tylenol) ° °Follow Up ° °Follow up will be arranged at the time of your procedure. You may have an office visit scheduled or may be scheduled for surgery. Ask your surgeon if you have any questions. ° °Please call us immediately for any of the following conditions: °•Severe or worsening pain your legs or feet at rest or with walking. °•Increased pain, redness, drainage at your groin puncture site. °•Fever of 101 degrees or higher. °•If you have any mild or slow bleeding from your puncture site: lie down, apply firm constant pressure over the area with a piece of  gauze or a clean wash cloth for 30 minutes- no peeking!, call 911 right away if you are still bleeding after 30 minutes, or if the bleeding is heavy and unmanageable. ° °Reduce your risk factors of vascular disease: ° °Stop smoking. If you would like help call [facility] at 1-800-QUIT-NOW (1-800-784-8669) or [facility] at [phone]. °Manage your cholesterol °Maintain a desired weight °Control your diabetes °Keep your blood pressure down ° °If you have any questions, please call the office at [phone] ° °

## 2020-08-06 NOTE — H&P (Signed)
History and Physical Interval Note:  08/06/2020 8:46 AM  Anthony Park  has presented today for surgery, with the diagnosis of pvd.  The various methods of treatment have been discussed with the patient and family. After consideration of risks, benefits and other options for treatment, the patient has consented to  Procedure(s): ABDOMINAL AORTOGRAM W/LOWER EXTREMITY (N/A) as a surgical intervention.  The patient's history has been reviewed, patient examined, no change in status, stable for surgery.  I have reviewed the patient's chart and labs.  Questions were answered to the patient's satisfaction.    CLI with tissue loss left leg.  AOrtogtram, lower extremity arteriogram.  Hx popliteal stent at [facility].    Nimah Uphoff J Nannette Zill  REASON FOR CONSULT:    Left foot pain with peripheral vascular disease.  The consult is requested by Dr. Lynelle Doctor.  ASSESSMENT & PLAN:   ISCHEMIC LEFT FOOT WITH PERIPHERAL VASCULAR DISEASE: This patient has evidence of infrainguinal arterial occlusive disease now with wounds on the left foot as shown below in the photograph.  I think this could become a limb threatening problem.  Based on his history it sounds like he had some underlying peripheral vascular disease and injured his foot when he stubbed it on a bedpost.  I do not get any history which would suggest sudden occlusion of his popliteal stent although I suspect this may be chronically occluded given the change in Dopplers findings compared to his noninvasive study in February 2020.  I have recommended arteriography to further assess his infrainguinal arterial occlusive disease.  I offered to admit him to the hospital with plans to potentially do the dye study late Monday or Tuesday.  This would also allow [place] to obtain a medical consult to evaluate his diabetes and hypertension.  He feels quite strongly about not staying in the hospital and for this reason I will try to arrange this as an outpatient on  Wednesday.  I have discussed with him the indications of the procedure and potential complications and he is agreeable to proceed.  I have discussed with him the importance of tobacco cessation.  I have also asked him to begin taking 81 mg of aspirin daily as he tells me he had not been taking aspirin.  Waverly Ferrari, MD Office: [phone]   HPI:   Anthony Park is a pleasant 59 y.o. male, who developed the gradual onset of left foot pain 2 to 3 weeks ago.  A week ago while going to the bathroom at night he stubbed his left foot on the bedpost and has had more pain since then.  He has reportedly had x-rays to show that there is no fracture.  However he recently had his hip replaced 12 weeks ago and is gone back to work and was having significant pain in his foot.  He presented to the emergency department tonight for further evaluation.  The patient had placement of a left popliteal artery stent at [facility] by Dr. Wyn Quaker on 02/13/2014.  I reviewed the operative note.  He had an 8 mm x 25 cm Viabahn stent placed from Hunter's canal down to the distal popliteal just above the takeoff of the anterior tibial artery.   Prior to developing foot pain the patient denied any calf claudication.  He denied any rest pain previously.  He had been having significant left hip pain and underwent a left total hip replacement approximately 12 weeks ago.  He is done well from that standpoint  and has had no hip pain since the surgery.  His risk factors for peripheral vascular disease include diabetes, hypertension, and tobacco use.  He smokes a pack per day and has been smoking since he was 59 years old.  He denies any history of hypercholesterolemia or family history of premature cardiovascular disease.  Of note he does not have a primary care physician so has not been on any medications for his diabetes or hypertension according to the patient.  Of note he is previously had  amputations of the right third toe and left first and second toes.      Past Medical History:  Diagnosis Date  . Arthritis   . Diabetes mellitus without complication (HCC)    type 2 controlled with diet   . Diabetic toe ulcer (HCC) 11/05/2018  . Hyperlipidemia   . Hypertension   . Peripheral vascular disease (HCC)    stent in left leg due to anerysm          Family History  Problem Relation Age of Onset  . COPD Mother   . Cataracts Mother   . Brain cancer Father   . Hypertension Brother     SOCIAL HISTORY: Social History        Socioeconomic History  . Marital status: Single    Spouse name: [name]  . Number of children: 3  . Years of education: 89  . Highest education level: Not on file  Occupational History  . Not on file  Tobacco Use  . Smoking status: Current Every Day Smoker    Packs/day: 0.50    Types: Cigarettes  . Smokeless tobacco: Never Used  Vaping Use  . Vaping Use: Never used  Substance and Sexual Activity  . Alcohol use: Yes    Alcohol/week: 7.0 standard drinks    Types: 7 Shots of liquor per week    Comment: 7 per week  . Drug use: Not Currently    Comment: hx of marijuana 15 years ago   . Sexual activity: Yes  Other Topics Concern  . Not on file  Social History Narrative  . Not on file   Social Determinants of Health      Financial Resource Strain:   . Difficulty of Paying Living Expenses: Not on file  Food Insecurity:   . Worried About Programme researcher, broadcasting/film/video in the Last Year: Not on file  . Ran Out of Food in the Last Year: Not on file  Transportation Needs:   . Lack of Transportation (Medical): Not on file  . Lack of Transportation (Non-Medical): Not on file  Physical Activity:   . Days of Exercise per Week: Not on file  . Minutes of Exercise per Session: Not on file  Stress:   . Feeling of Stress : Not on file  Social Connections:   . Frequency of Communication with Friends and Family: Not on file   . Frequency of Social Gatherings with Friends and Family: Not on file  . Attends Religious Services: Not on file  . Active Member of Clubs or Organizations: Not on file  . Attends Banker Meetings: Not on file  . Marital Status: Not on file  Intimate Partner Violence:   . Fear of Current or Ex-Partner: Not on file  . Emotionally Abused: Not on file  . Physically Abused: Not on file  . Sexually Abused: Not on file    No Known Allergies  No current facility-administered medications for this encounter.  Current Outpatient Medications  Medication Sig Dispense Refill  . ascorbic acid (VITAMIN C) 500 MG tablet Take 500 mg by mouth daily.    . cholecalciferol (VITAMIN D3) 25 MCG (1000 UNIT) tablet Take 1,000 Units by mouth daily.    Marland Kitchen. GARLIC PO Take 1 tablet by mouth daily.    Marland Kitchen. lisinopril (ZESTRIL) 20 MG tablet TAKE 1 TABLET BY MOUTH EVERY DAY (Patient taking differently: Take 20 mg by mouth daily. ) 90 tablet 1  . methocarbamol (ROBAXIN) 500 MG tablet Take 1 tablet (500 mg total) by mouth every 6 (six) hours as needed for muscle spasms. 40 tablet 1  . Multiple Vitamins-Minerals (MULTIVITAMIN WITH MINERALS) tablet Take 1 tablet by mouth daily.    Marland Kitchen. oxyCODONE (ROXICODONE) 5 MG immediate release tablet Take 1 tablet (5 mg total) by mouth every 6 (six) hours as needed for severe pain. 30 tablet 0  . Turmeric 500 MG CAPS Take 500 mg by mouth daily.    Marland Kitchen. aspirin EC 81 MG tablet Take 1 tablet (81 mg total) by mouth 2 (two) times daily after a meal. Swallow whole. (Patient not taking: Reported on 08/03/2020) 30 tablet 0  . ondansetron (ZOFRAN ODT) 4 MG disintegrating tablet Take 1 tablet (4 mg total) by mouth every 8 (eight) hours as needed for nausea or vomiting. (Patient not taking: Reported on 08/03/2020) 20 tablet 0    REVIEW OF SYSTEMS:  [X]  denotes positive finding, [ ]  denotes negative finding Cardiac  Comments:  Chest pain or chest pressure:     Shortness of breath upon exertion:    Short of breath when lying flat:    Irregular heart rhythm:        Vascular    Pain in calf, thigh, or hip brought on by ambulation:    Pain in feet at night that wakes you up from your sleep:  x   Blood clot in your veins:    Leg swelling:         Pulmonary    Oxygen at home:    Productive cough:     Wheezing:         Neurologic    Sudden weakness in arms or legs:     Sudden numbness in arms or legs:     Sudden onset of difficulty speaking or slurred speech:    Temporary loss of vision in one eye:     Problems with dizziness:         Gastrointestinal    Blood in stool:     Vomited blood:         Genitourinary    Burning when urinating:     Blood in urine:        Psychiatric    Major depression:         Hematologic    Bleeding problems:    Problems with blood clotting too easily:        Skin    Rashes or ulcers:        Constitutional    Fever or chills:     PHYSICAL EXAM:         Vitals:   08/03/20 1520 08/03/20 1530 08/03/20 1545 08/03/20 1600  BP: (!) 156/106 (!) 154/106 (!) 171/118 (!) 167/109  Pulse: 89 88 86 91  Resp: 20 (!) 21 16 15   Temp:      TempSrc:      SpO2: 97% 97% 98% 97%   GENERAL: The patient is a well-nourished male,  in no acute distress. The vital signs are documented above. CARDIAC: There is a regular rate and rhythm.  VASCULAR: I do not detect carotid bruits. On the left side, which is the side of concern, he has a palpable femoral pulse.  I cannot palpate a popliteal or pedal pulses.  He has a fairly brisk but monophasic posterior tibial signal with a Doppler.  He has a monophasic dampened anterior tibial signal with the Doppler.  I cannot obtain a dorsalis pedis signal. On the right side he has a palpable femoral, popliteal, and posterior tibial pulse.  He has a brisk dorsalis pedis signal with  a Doppler. PULMONARY: There is good air exchange bilaterally without wheezing or rales. ABDOMEN: Soft and non-tender with normal pitched bowel sounds.  I do not palpate an abdominal aortic aneurysm although it is difficult to assess because of his body habitus. MUSCULOSKELETAL: There are no major deformities or cyanosis. NEUROLOGIC: No focal weakness or paresthesias are detected. SKIN:      PSYCHIATRIC: The patient has a normal affect.  DATA:    His labs are pending.  The only ABIs I can find on the patient or back in February 2020.  At that time on the right side, he had a triphasic posterior tibial signal with a monophasic dorsalis pedis signal.  ABI was 100%.  Toe pressure was 82 mmHg.  On the left side he had a triphasic posterior tibial signal with a biphasic dorsalis pedis signal.  ABI was 100%.

## 2020-08-06 NOTE — Progress Notes (Signed)
Site area: Right groin a 6 french arterial sheath was removed  Site Prior to Removal:  Level 0  Pressure Applied For 20 MINUTES    Bedrest Beginning at 1235pam for 4 hours  Manual:   Yes.    Patient Status During Pull:  stable  Post Pull Groin Site:  Level 0  Post Pull Instructions Given:  Yes.    Post Pull Pulses Present:  Yes.    Dressing Applied:  Yes.    Comments:

## 2020-08-06 NOTE — Op Note (Signed)
Patient name: Anthony Park MRN: [id number] DOB: September 03, 1961 Sex: male  08/06/2020 Pre-operative Diagnosis: Critical limb ischemia of the left lower extremity with tissue loss Post-operative diagnosis:  Same Surgeon:  [name], MD Procedure Performed: 1. Ultrasound guided access of right common femoral artery 2. Aortogram including catheter selection of aorta 3. Bilateral lower extremity arteriogram with runoff including selection of third order branches in the left lower extremity 4. Left peroneal artery angioplasty (3 mm x 220 mm Sterling) 5. 60 minutes of monitored moderate conscious sedation time  Indications: Patient is a 59 year old male who was seen in consultation over the weekend with Dr. Edilia Bo with new tissue loss in the left lower extremity. He has history of remote popliteal stent done at [facility] regional with a Viabahn years ago. He presents today for left lower extremity arteriogram and possible intervention.  Findings:   Aortogram showed abdominal aortic aneurysm with two right renal and one left renal arteries that are both patent. He has aneurysmal bilateral common iliacs. He has diffuse arteriomegaly. I did not see any flow-limiting stenosis in the aortoiliac segment.  Left lower extremity arteriogram which was the side of interest showed a patent common femoral, profunda, SFA. His previous popliteal Viabahn stent is widely patent with patent above and below-knee popliteal artery. He has significant tibial disease with essentially single-vessel runoff via posterior tibial that is very large and robust but then occludes at the ankle. The peroneal had multiple segments of subtotal occlusion throughout the mid to distal segment. The anterior tibial occludes in the mid calf with no distal reconstitution of any dorsalis pedis or other distal target. He has significant small vessel disease in the left foot with very poor outflow.  Ultimately the left peroneal was  crossed antegrade and angioplasty was performed with 3 mm Sterling throughout the entire segment.  Right lower extremity runoff was more sluggish and not adequately evaluated during bilateral lower extremity arteriogram but did see a patent common femoral, profunda, and SFA.   Procedure:  The patient was identified in the holding area and taken to room 8.  The patient was then placed supine on the table and prepped and draped in the usual sterile fashion.  A time out was called.  Ultrasound was used to evaluate the right common femoral artery.  It was patent .  A digital ultrasound image was acquired.  A micropuncture needle was used to access the right common femoral artery under ultrasound guidance.  An 018 wire was advanced without resistance and a micropuncture sheath was placed.  The 018 wire was removed and a benson wire was placed.  The micropuncture sheath was exchanged for a 5 french sheath. We had to use a KMP catheter to cross the right iliac retrograde and get into the aorta given diffuse tortuosity. Eventually advanced an Omni Flush catheter and abdominal aortogram was obtained and then the catheter was pulled down and bilateral lower extremity runoff was obtained. Pertinent findings are noted above. Ultimately after evaluating imaging appeared to have significant tibial disease on the left with a widely patent popliteal stent. I then used a Glidewire advantage with a Omni Flush catheter to cross the aortic bifurcation into the left iliac and got my wire down into the left SFA. Exchanged for 6 [place] long Ansil sheath in the right groin over the aortic bifurcation that was placed into the left distal external iliac. Patient was given 100 units/kg heparin. I then used a long 90 cm straight flush  catheter to get my wire down into the popliteal stent. Over this we exchanged for a V 18 wire and a 018 quick cross catheter. Ultimately cannulated the peroneal and got a wire all the way down into the distal  peroneal into the collateral at the ankle. The entire peroneal was then angioplastied with a 3 mm Sterling to nominal pressure for 2 minutes throughout its length. Nitroglycerin was given throughout the case to prevent vasospasm. Another hand-injection showed excellent results in the proximal to mid peroneal artery with flow all the way down to the ankle. There was very poor outflow from the peroneal as demonstrated on preoperative imaging. I think this is his best chance to heal his tissue loss. And I did not think any other intervention was warranted today. Wires and catheters were removed and the sheath was pulled over the aortic bifurcation and he will be taken holding to have pressure held and sheath removed.  Plan: Patient will be loaded on Plavix with dual antiplatelet therapy.  I will also start him on low dose liptior. I will see him in 1 month with wound check and left leg arterial duplex.  High risk for limb loss given significant small vessel disease in the foot.  [name], MD [facility] [place] Office: [phone]

## 2020-08-07 MED FILL — Heparin Sodium (Porcine) Inj 1000 Unit/ML: INTRAMUSCULAR | Qty: 10 | Status: AC

## 2020-08-07 MED FILL — Heparin Sod (Porcine)-NaCl IV Soln 1000 Unit/500ML-0.9%: INTRAVENOUS | Qty: 1000 | Status: AC

## 2020-08-08 ENCOUNTER — Encounter (HOSPITAL_COMMUNITY): Payer: Self-pay | Admitting: Vascular Surgery

## 2020-08-18 ENCOUNTER — Telehealth: Payer: Self-pay

## 2020-08-18 NOTE — Telephone Encounter (Signed)
Patient called about toe ulcers. He had an angio on 11/24. Ulcers were present prior to angio. Per patient the blood flow seems better. There is some redness between the pinky toe and the next toe. Patient denies drainage/fever. Says there is some weeping of clear fluid. It hurts when he walks - he was up 3x in the night with pain. I am concerned about the nighttime pain, moved up appts for tomorrow.

## 2020-08-19 ENCOUNTER — Other Ambulatory Visit: Payer: Self-pay

## 2020-08-19 ENCOUNTER — Other Ambulatory Visit (HOSPITAL_COMMUNITY)
Admission: RE | Admit: 2020-08-19 | Discharge: 2020-08-19 | Disposition: A | Discharge status: Home / Self Care | Payer: BC Managed Care – PPO | Source: Ambulatory Visit | Attending: Vascular Surgery | Admitting: Vascular Surgery

## 2020-08-19 ENCOUNTER — Ambulatory Visit (INDEPENDENT_AMBULATORY_CARE_PROVIDER_SITE_OTHER)
Admission: RE | Admit: 2020-08-19 | Discharge: 2020-08-19 | Disposition: A | Discharge status: Home / Self Care | Payer: BC Managed Care – PPO | Source: Ambulatory Visit | Attending: Vascular Surgery | Admitting: Vascular Surgery

## 2020-08-19 ENCOUNTER — Ambulatory Visit (HOSPITAL_COMMUNITY)
Admission: RE | Admit: 2020-08-19 | Discharge: 2020-08-19 | Disposition: A | Discharge status: Home / Self Care | Payer: BC Managed Care – PPO | Source: Ambulatory Visit | Attending: Vascular Surgery | Admitting: Vascular Surgery

## 2020-08-19 ENCOUNTER — Encounter (HOSPITAL_COMMUNITY): Payer: Self-pay | Admitting: Vascular Surgery

## 2020-08-19 ENCOUNTER — Ambulatory Visit (INDEPENDENT_AMBULATORY_CARE_PROVIDER_SITE_OTHER): Payer: BC Managed Care – PPO | Admitting: Physician Assistant

## 2020-08-19 VITALS — BP 135/90 | HR 87 | Temp 98.7°F | Resp 20 | Ht 73.0 in | Wt 221.0 lb

## 2020-08-19 DIAGNOSIS — Z9841 Cataract extraction status, right eye: Secondary | ICD-10-CM | POA: Diagnosis not present

## 2020-08-19 DIAGNOSIS — Z87891 Personal history of nicotine dependence: Secondary | ICD-10-CM | POA: Diagnosis not present

## 2020-08-19 DIAGNOSIS — Z95820 Peripheral vascular angioplasty status with implants and grafts: Secondary | ICD-10-CM

## 2020-08-19 DIAGNOSIS — I779 Disorder of arteries and arterioles, unspecified: Secondary | ICD-10-CM

## 2020-08-19 DIAGNOSIS — Z20822 Contact with and (suspected) exposure to covid-19: Secondary | ICD-10-CM | POA: Diagnosis not present

## 2020-08-19 DIAGNOSIS — E785 Hyperlipidemia, unspecified: Secondary | ICD-10-CM | POA: Diagnosis not present

## 2020-08-19 DIAGNOSIS — M869 Osteomyelitis, unspecified: Secondary | ICD-10-CM

## 2020-08-19 DIAGNOSIS — Z01818 Encounter for other preprocedural examination: Secondary | ICD-10-CM | POA: Insufficient documentation

## 2020-08-19 DIAGNOSIS — E782 Mixed hyperlipidemia: Secondary | ICD-10-CM | POA: Diagnosis not present

## 2020-08-19 DIAGNOSIS — Z7982 Long term (current) use of aspirin: Secondary | ICD-10-CM | POA: Diagnosis not present

## 2020-08-19 DIAGNOSIS — L03116 Cellulitis of left lower limb: Secondary | ICD-10-CM

## 2020-08-19 DIAGNOSIS — I10 Essential (primary) hypertension: Secondary | ICD-10-CM | POA: Diagnosis not present

## 2020-08-19 DIAGNOSIS — Z96642 Presence of left artificial hip joint: Secondary | ICD-10-CM | POA: Diagnosis not present

## 2020-08-19 DIAGNOSIS — I70262 Atherosclerosis of native arteries of extremities with gangrene, left leg: Secondary | ICD-10-CM | POA: Diagnosis not present

## 2020-08-19 DIAGNOSIS — Z79899 Other long term (current) drug therapy: Secondary | ICD-10-CM | POA: Diagnosis not present

## 2020-08-19 DIAGNOSIS — I96 Gangrene, not elsewhere classified: Secondary | ICD-10-CM | POA: Diagnosis not present

## 2020-08-19 DIAGNOSIS — Z8249 Family history of ischemic heart disease and other diseases of the circulatory system: Secondary | ICD-10-CM | POA: Diagnosis not present

## 2020-08-19 DIAGNOSIS — I714 Abdominal aortic aneurysm, without rupture: Secondary | ICD-10-CM | POA: Diagnosis not present

## 2020-08-19 DIAGNOSIS — E1152 Type 2 diabetes mellitus with diabetic peripheral angiopathy with gangrene: Secondary | ICD-10-CM | POA: Diagnosis not present

## 2020-08-19 DIAGNOSIS — Z7902 Long term (current) use of antithrombotics/antiplatelets: Secondary | ICD-10-CM | POA: Diagnosis not present

## 2020-08-19 DIAGNOSIS — Z89421 Acquired absence of other right toe(s): Secondary | ICD-10-CM | POA: Diagnosis not present

## 2020-08-19 DIAGNOSIS — Z9842 Cataract extraction status, left eye: Secondary | ICD-10-CM | POA: Diagnosis not present

## 2020-08-19 LAB — SARS CORONAVIRUS 2 (TAT 6-24 HRS): SARS Coronavirus 2: NEGATIVE

## 2020-08-19 NOTE — Progress Notes (Signed)
Office Note     CC:  follow up Requesting Provider:  No ref. provider found  HPI: Anthony Park is a 59 y.o. (Oct 24, 1960) male who presents for follow-up of recent arteriogram with intervention due to left lower extremity tissue loss. He had aortogram with run off on 08/06/2020 by Dr. Chestine Spore.  He intervened with left peroneal artery angioplasty:  "Plan: Patient will be loaded on Plavix with dual antiplatelet therapy.I will also start him on low dose liptior.I will see him in 1 month with wound check and left leg arterial duplex. High risk for limb loss given significant small vessel disease in the foot."  He presented to the hospital on 08/03/2020 with ischemic changes to the left 3rd and 5th toes and erythema of the dorsum of his left foot. He reported stubbing his foot. He was seen in consultation by Dr. Edilia Bo. Today, he denies fever or chills. He has been putting antibiotic ointment and dry dressing on his foot. No claudication or rest pain.  His previously placed left popliteal stent was patent. This was placed at [facility] by Dr. Wyn Quaker on 02/13/2014. He had an 8 mm x 25 cm Viabahn stent placed from Hunter's canal down to the distal popliteal just above the takeoff of the anterior tibial artery.   Maintained on Plavix, aspirin and statin. Previous smoker; he quit after arteriogram. His PCP physician left their practice and he has no PCP at present.  Past Medical History:  Diagnosis Date  . Arthritis   . Diabetes mellitus without complication (HCC)    type 2 controlled with diet   . Diabetic toe ulcer (HCC) 11/05/2018  . Hyperlipidemia   . Hypertension   . Peripheral vascular disease (HCC)    stent in left leg due to anerysm     Past Surgical History:  Procedure Laterality Date  . ABDOMINAL AORTOGRAM W/LOWER EXTREMITY N/A 08/06/2020   Procedure: ABDOMINAL AORTOGRAM W/LOWER EXTREMITY;  Surgeon: [name], MD;  Location: [facility] INVASIVE CV LAB;   Service: Cardiovascular;  Laterality: N/A;  . AMPUTATION Right 11/07/2018   Procedure: RIGHT 3RD TOE AMPUTATION;  Surgeon: Nadara Mustard, MD;  Location: [facility] OR;  Service: Orthopedics;  Laterality: Right;  . AMPUTATION TOE Left    big toe and one next to it  . CATARACT EXTRACTION Bilateral   . PERIPHERAL VASCULAR BALLOON ANGIOPLASTY Left 08/06/2020   Procedure: PERIPHERAL VASCULAR BALLOON ANGIOPLASTY;  Surgeon: [name], MD;  Location: [facility] INVASIVE CV LAB;  Service: Cardiovascular;  Laterality: Left;  Peroneal artery.  . TOE AMPUTATION Left 2015  . TOTAL HIP ARTHROPLASTY Left 05/23/2020   Procedure: LEFT TOTAL HIP ARTHROPLASTY ANTERIOR APPROACH;  Surgeon: Kathryne Hitch, MD;  Location: [facility] ORS;  Service: Orthopedics;  Laterality: Left;    Social History   Socioeconomic History  . Marital status: Single    Spouse name: [name]  . Number of children: 3  . Years of education: 61  . Highest education level: Not on file  Occupational History  . Not on file  Tobacco Use  . Smoking status: Current Every Day Smoker    Packs/day: 0.50    Types: Cigarettes  . Smokeless tobacco: Never Used  Vaping Use  . Vaping Use: Never used  Substance and Sexual Activity  . Alcohol use: Yes    Alcohol/week: 7.0 standard drinks    Types: 7 Shots of liquor per week    Comment: 7 per week  . Drug use: Not Currently  Comment: hx of marijuana 15 years ago   . Sexual activity: Yes  Other Topics Concern  . Not on file  Social History Narrative  . Not on file   Social Determinants of Health   Financial Resource Strain:   . Difficulty of Paying Living Expenses: Not on file  Food Insecurity:   . Worried About Programme researcher, broadcasting/film/video in the Last Year: Not on file  . Ran Out of Food in the Last Year: Not on file  Transportation Needs:   . Lack of Transportation (Medical): Not on file  . Lack of Transportation (Non-Medical): Not on file  Physical Activity:   . Days of Exercise per Week:  Not on file  . Minutes of Exercise per Session: Not on file  Stress:   . Feeling of Stress : Not on file  Social Connections:   . Frequency of Communication with Friends and Family: Not on file  . Frequency of Social Gatherings with Friends and Family: Not on file  . Attends Religious Services: Not on file  . Active Member of Clubs or Organizations: Not on file  . Attends Banker Meetings: Not on file  . Marital Status: Not on file  Intimate Partner Violence:   . Fear of Current or Ex-Partner: Not on file  . Emotionally Abused: Not on file  . Physically Abused: Not on file  . Sexually Abused: Not on file   Family History  Problem Relation Age of Onset  . COPD Mother   . Cataracts Mother   . Brain cancer Father   . Hypertension Brother     Current Outpatient Medications  Medication Sig Dispense Refill  . ascorbic acid (VITAMIN C) 500 MG tablet Take 500 mg by mouth daily.    Marland Kitchen aspirin EC 81 MG tablet Take 1 tablet (81 mg total) by mouth 2 (two) times daily after a meal. Swallow whole. 30 tablet 0  . atorvastatin (LIPITOR) 10 MG tablet Take 1 tablet (10 mg total) by mouth daily. 30 tablet 11  . cholecalciferol (VITAMIN D3) 25 MCG (1000 UNIT) tablet Take 1,000 Units by mouth daily.    . clopidogrel (PLAVIX) 75 MG tablet Take 1 tablet (75 mg total) by mouth daily. 30 tablet 11  . GARLIC PO Take 1 tablet by mouth daily.    Marland Kitchen lisinopril (ZESTRIL) 20 MG tablet TAKE 1 TABLET BY MOUTH EVERY DAY (Patient taking differently: Take 20 mg by mouth daily. ) 90 tablet 1  . methocarbamol (ROBAXIN) 500 MG tablet Take 1 tablet (500 mg total) by mouth every 6 (six) hours as needed for muscle spasms. 40 tablet 1  . Multiple Vitamins-Minerals (MULTIVITAMIN WITH MINERALS) tablet Take 1 tablet by mouth daily.    . ondansetron (ZOFRAN ODT) 4 MG disintegrating tablet Take 1 tablet (4 mg total) by mouth every 8 (eight) hours as needed for nausea or vomiting. (Patient not taking: Reported on  08/03/2020) 20 tablet 0  . oxyCODONE (ROXICODONE) 5 MG immediate release tablet Take 1 tablet (5 mg total) by mouth every 6 (six) hours as needed for severe pain. 30 tablet 0  . Turmeric 500 MG CAPS Take 500 mg by mouth daily.     No current facility-administered medications for this visit.    No Known Allergies   REVIEW OF SYSTEMS:   [X]  denotes positive finding, [ ]  denotes negative finding Cardiac  Comments:  Chest pain or chest pressure:    Shortness of breath upon exertion:  Short of breath when lying flat:    Irregular heart rhythm:        Vascular    Pain in calf, thigh, or hip brought on by ambulation:    Pain in feet at night that wakes you up from your sleep:     Blood clot in your veins:    Leg swelling:         Pulmonary    Oxygen at home:    Productive cough:     Wheezing:         Neurologic    Sudden weakness in arms or legs:     Sudden numbness in arms or legs:     Sudden onset of difficulty speaking or slurred speech:    Temporary loss of vision in one eye:     Problems with dizziness:         Gastrointestinal    Blood in stool:     Vomited blood:         Genitourinary    Burning when urinating:     Blood in urine:        Psychiatric    Major depression:         Hematologic    Bleeding problems:    Problems with blood clotting too easily:        Skin    Rashes or ulcers:        Constitutional    Fever or chills:      PHYSICAL EXAMINATION:  Vitals:   08/19/20 1007  BP: 135/90  Pulse: 87  Resp: 20  Temp: 98.7 F (37.1 C)  SpO2: 95%   General:  WDWN in NAD; vital signs documented above Gait: no ataxia; using cane HENT: WNL, normocephalic Pulmonary: normal non-labored breathing , without Rales, rhonchi,  wheezing Cardiac: regular HR,  Skin: with rashes Vascular Exam/Pulses: Brisk Pt, Dp and peroneal Doppler signals Extremities: with ischemic changes, with Gangrene , with cellulitis; without open wounds;  Musculoskeletal: no  muscle wasting or atrophy  Neurologic: A&O X 3;  No focal weakness or paresthesias are detected Psychiatric:  The pt has Normal affect.        Non-Invasive Vascular Imaging:   08/19/2020 ABI/TBIToday's ABIToday's TBIPrevious ABIPrevious TBI  +-------+-----------+-----------+------------+------------+  Right 1.24    0.75                  +-------+-----------+-----------+------------+------------+  Left  1.2    NA                   +-------+-----------+-----------+------------+------------+  Left pedal pressure may be falsely elevated. Triphasic right waveforms   LLE duplex: Left: Patent popliteal stent. Patent peroneal artery to the ankle. Waveforms are triphasic.   ASSESSMENT/PLAN:: 59 y.o. male here for follow up for arteriogram on 08/06/2020:  Aortogram showed abdominal aortic aneurysm withtworight renal and one left renal arteries that are both patent. He has aneurysmal bilateral common iliacs. He has diffuse arteriomegaly. I did not see any flow-limiting stenosis in the aortoiliac segment.  Left lower extremity arteriogram which was the side of interest showed a patent common femoral,profunda,SFA. His previous popliteal Viabahn stent is widely patent with patent above and below-knee popliteal artery. He has significant tibial disease with essentially single-vessel runoff via posterior tibial that is very large and robust but then occludes at the ankle. The peroneal had multiple segments of subtotal occlusion throughout the mid to distal segment. The anterior tibial occludesin the midcalf with no distal reconstitution of any  dorsalis pedis or other distal target. He has significant small vessel disease in the left foot with very poor outflow.  Ultimately the left peroneal was crossed antegrade and angioplasty was performed with 3 mm Sterlingthroughout the entire segment.  Right lower extremity runoff was more  sluggish and not adequately evaluated during bilateral lower extremity arteriogrambut did see a patent common femoral, profunda, and SFA.  Patient returns today with worsening appearance of left forefoot and toes.  There is cellulitis of the left foot and gangrene of left toes with the possibility of osteomyelitis involving toes and/or metatarsals.  Status post intervention of the left lower extremity.  Informed the patient of the extensive small vessel disease and inability to treat these vessels from a peripheral vascular standpoint.  I discussed the case with Dr. Chestine Spore who also evaluated the patient.  In order to preserve function of his left lower extremity and to prevent further deep tissue infection and blood infection, recommend left transmetatarsal amputation.  The patient was informed that there is the possibility he will be unable to heal the amputation site.  If unable to close this primarily wound, appropriate dressing, possibly with negative pressure wound system would be applied.  Patient's questions are answered he is in agreement with this plan.  We will plan left TMA tomorrow with Dr. Chestine Spore.  We will initiate IV antibiotics and postoperative admission.  Continue aspirin, Plavix and statin as previously prescribed.   Milinda Antis, PA-C [facility] [phone]  Clinic MD:   Chestine Spore

## 2020-08-19 NOTE — Progress Notes (Signed)
Anthony Park denies chest pain or shortness of breath. Patient was tested for Covid today and has been in quarantine since that time.  Anthony Park has type II diabetes - he is diet controlled.  Patient reports that since her started on Plavix and Atorvastatin, the CBG have increased, "CBG's used to run 120, now run up to 1745." I instructed patient to check CBG after awaking and every 2 hours until arrival  to the hospital.  I Instructed patient if CBG is less than 70 to drink 1/2 cup of a clear juice. Recheck CBG in 15 minutes then call pre- op desk at [phone] for further instructions.

## 2020-08-20 ENCOUNTER — Ambulatory Visit (HOSPITAL_COMMUNITY): Payer: BC Managed Care – PPO | Admitting: Anesthesiology

## 2020-08-20 ENCOUNTER — Encounter (HOSPITAL_COMMUNITY): Payer: Self-pay | Admitting: Vascular Surgery

## 2020-08-20 ENCOUNTER — Inpatient Hospital Stay (HOSPITAL_COMMUNITY)
Admission: RE | Admit: 2020-08-20 | Discharge: 2020-08-24 | DRG: 240 | Disposition: A | Discharge status: Home / Self Care | Payer: BC Managed Care – PPO | Attending: Vascular Surgery | Admitting: Vascular Surgery

## 2020-08-20 ENCOUNTER — Encounter (HOSPITAL_COMMUNITY)
Admission: RE | Disposition: A | Discharge status: Home / Self Care | Payer: Self-pay | Source: Home / Self Care | Attending: Vascular Surgery

## 2020-08-20 ENCOUNTER — Other Ambulatory Visit: Payer: Self-pay

## 2020-08-20 DIAGNOSIS — I714 Abdominal aortic aneurysm, without rupture: Secondary | ICD-10-CM | POA: Diagnosis present

## 2020-08-20 DIAGNOSIS — Z20822 Contact with and (suspected) exposure to covid-19: Secondary | ICD-10-CM | POA: Diagnosis present

## 2020-08-20 DIAGNOSIS — I10 Essential (primary) hypertension: Secondary | ICD-10-CM | POA: Diagnosis present

## 2020-08-20 DIAGNOSIS — Z9842 Cataract extraction status, left eye: Secondary | ICD-10-CM

## 2020-08-20 DIAGNOSIS — E785 Hyperlipidemia, unspecified: Secondary | ICD-10-CM | POA: Diagnosis present

## 2020-08-20 DIAGNOSIS — Z9841 Cataract extraction status, right eye: Secondary | ICD-10-CM | POA: Diagnosis not present

## 2020-08-20 DIAGNOSIS — Z89421 Acquired absence of other right toe(s): Secondary | ICD-10-CM

## 2020-08-20 DIAGNOSIS — E1152 Type 2 diabetes mellitus with diabetic peripheral angiopathy with gangrene: Secondary | ICD-10-CM | POA: Diagnosis not present

## 2020-08-20 DIAGNOSIS — Z79899 Other long term (current) drug therapy: Secondary | ICD-10-CM

## 2020-08-20 DIAGNOSIS — Z8249 Family history of ischemic heart disease and other diseases of the circulatory system: Secondary | ICD-10-CM

## 2020-08-20 DIAGNOSIS — E782 Mixed hyperlipidemia: Secondary | ICD-10-CM | POA: Diagnosis not present

## 2020-08-20 DIAGNOSIS — I739 Peripheral vascular disease, unspecified: Secondary | ICD-10-CM | POA: Diagnosis present

## 2020-08-20 DIAGNOSIS — I70262 Atherosclerosis of native arteries of extremities with gangrene, left leg: Secondary | ICD-10-CM | POA: Diagnosis not present

## 2020-08-20 DIAGNOSIS — I96 Gangrene, not elsewhere classified: Secondary | ICD-10-CM

## 2020-08-20 DIAGNOSIS — Z95820 Peripheral vascular angioplasty status with implants and grafts: Secondary | ICD-10-CM | POA: Diagnosis not present

## 2020-08-20 DIAGNOSIS — L03116 Cellulitis of left lower limb: Secondary | ICD-10-CM | POA: Diagnosis present

## 2020-08-20 DIAGNOSIS — Z7982 Long term (current) use of aspirin: Secondary | ICD-10-CM | POA: Diagnosis not present

## 2020-08-20 DIAGNOSIS — Z7902 Long term (current) use of antithrombotics/antiplatelets: Secondary | ICD-10-CM

## 2020-08-20 DIAGNOSIS — Z96642 Presence of left artificial hip joint: Secondary | ICD-10-CM | POA: Diagnosis present

## 2020-08-20 DIAGNOSIS — Z87891 Personal history of nicotine dependence: Secondary | ICD-10-CM

## 2020-08-20 HISTORY — PX: AMPUTATION: SHX166

## 2020-08-20 HISTORY — PX: OTHER SURGICAL HISTORY: SHX169

## 2020-08-20 HISTORY — DX: Personal history of urinary calculi: Z87.442

## 2020-08-20 LAB — POCT I-STAT, CHEM 8
BUN: 16 mg/dL (ref 6–20)
Calcium, Ion: 1.13 mmol/L — ABNORMAL LOW (ref 1.15–1.40)
Chloride: 107 mmol/L (ref 98–111)
Creatinine, Ser: 0.6 mg/dL — ABNORMAL LOW (ref 0.61–1.24)
Glucose, Bld: 168 mg/dL — ABNORMAL HIGH (ref 70–99)
HCT: 40 % (ref 39.0–52.0)
Hemoglobin: 13.6 g/dL (ref 13.0–17.0)
Potassium: 3.6 mmol/L (ref 3.5–5.1)
Sodium: 142 mmol/L (ref 135–145)
TCO2: 23 mmol/L (ref 22–32)

## 2020-08-20 LAB — CBC
HCT: 38 % — ABNORMAL LOW (ref 39.0–52.0)
Hemoglobin: 12.4 g/dL — ABNORMAL LOW (ref 13.0–17.0)
MCH: 28.8 pg (ref 26.0–34.0)
MCHC: 32.6 g/dL (ref 30.0–36.0)
MCV: 88.4 fL (ref 80.0–100.0)
Platelets: 263 10*3/uL (ref 150–400)
RBC: 4.3 MIL/uL (ref 4.22–5.81)
RDW: 13 % (ref 11.5–15.5)
WBC: 6.7 10*3/uL (ref 4.0–10.5)
nRBC: 0 % (ref 0.0–0.2)

## 2020-08-20 LAB — CREATININE, SERUM
Creatinine, Ser: 0.86 mg/dL (ref 0.61–1.24)
GFR, Estimated: >60 mL/min (ref >60)

## 2020-08-20 LAB — GLUCOSE, CAPILLARY
Glucose-Capillary: 178 mg/dL — ABNORMAL HIGH (ref 70–99)
Glucose-Capillary: 153 mg/dL — ABNORMAL HIGH (ref 70–99)

## 2020-08-20 SURGERY — AMPUTATION, FOOT, PARTIAL
Anesthesia: Monitor Anesthesia Care | Site: Foot | Laterality: Left

## 2020-08-20 MED ORDER — HYDRALAZINE HCL 20 MG/ML IJ SOLN: 5.0000 mg | INTRAMUSCULAR | Status: DC | PRN

## 2020-08-20 MED ORDER — ORAL CARE MOUTH RINSE: 15.0000 mL | Freq: Once | OROMUCOSAL | Status: AC

## 2020-08-20 MED ORDER — SODIUM CHLORIDE 0.9 % IV SOLN: 500.0000 mL | Freq: Once | INTRAVENOUS | Status: DC | PRN

## 2020-08-20 MED ORDER — CEFAZOLIN SODIUM-DEXTROSE 2-4 GM/100ML-% IV SOLN
2.0000 g | INTRAVENOUS | Status: AC
  Administered 2020-08-20: 2 g via INTRAVENOUS
  Filled 2020-08-20: qty 100

## 2020-08-20 MED ORDER — PANTOPRAZOLE SODIUM 40 MG PO TBEC
40.0000 mg | DELAYED_RELEASE_TABLET | Freq: Every day | ORAL | Status: DC
  Administered 2020-08-20 – 2020-08-24 (×3): 40 mg via ORAL
  Filled 2020-08-20 (×5): qty 1

## 2020-08-20 MED ORDER — LABETALOL HCL 5 MG/ML IV SOLN
10.0000 mg | Freq: Once | INTRAVENOUS | Status: AC
  Administered 2020-08-20: 10 mg via INTRAVENOUS

## 2020-08-20 MED ORDER — VANCOMYCIN HCL 2000 MG/400ML IV SOLN
2000.0000 mg | Freq: Once | INTRAVENOUS | Status: AC
  Administered 2020-08-20: 2000 mg via INTRAVENOUS
  Filled 2020-08-20: qty 400

## 2020-08-20 MED ORDER — MIDAZOLAM HCL 2 MG/2ML IJ SOLN: 2.0000 mg | Freq: Once | INTRAMUSCULAR | Status: AC

## 2020-08-20 MED ORDER — METOPROLOL TARTRATE 5 MG/5ML IV SOLN: 2.0000 mg | INTRAVENOUS | Status: DC | PRN

## 2020-08-20 MED ORDER — ASPIRIN EC 81 MG PO TBEC
81.0000 mg | DELAYED_RELEASE_TABLET | Freq: Two times a day (BID) | ORAL | Status: DC
  Administered 2020-08-20 – 2020-08-24 (×8): 81 mg via ORAL
  Filled 2020-08-20 (×8): qty 1

## 2020-08-20 MED ORDER — MIDAZOLAM HCL 2 MG/2ML IJ SOLN
INTRAMUSCULAR | Status: AC
  Administered 2020-08-20: 2 mg via INTRAVENOUS
  Filled 2020-08-20: qty 2

## 2020-08-20 MED ORDER — ATORVASTATIN CALCIUM 10 MG PO TABS
10.0000 mg | ORAL_TABLET | Freq: Every day | ORAL | Status: DC
  Administered 2020-08-20 – 2020-08-24 (×5): 10 mg via ORAL
  Filled 2020-08-20 (×5): qty 1

## 2020-08-20 MED ORDER — CHLORHEXIDINE GLUCONATE 0.12 % MT SOLN
15.0000 mL | Freq: Once | OROMUCOSAL | Status: AC
  Administered 2020-08-20: 15 mL via OROMUCOSAL
  Filled 2020-08-20: qty 15

## 2020-08-20 MED ORDER — ENOXAPARIN SODIUM 40 MG/0.4ML ~~LOC~~ SOLN
40.0000 mg | SUBCUTANEOUS | Status: DC
  Administered 2020-08-21 – 2020-08-24 (×4): 40 mg via SUBCUTANEOUS
  Filled 2020-08-20 (×4): qty 0.4

## 2020-08-20 MED ORDER — PROPOFOL 10 MG/ML IV BOLUS
INTRAVENOUS | Status: AC
  Filled 2020-08-20: qty 20

## 2020-08-20 MED ORDER — OXYCODONE-ACETAMINOPHEN 5-325 MG PO TABS
1.0000 | ORAL_TABLET | ORAL | Status: DC | PRN
  Administered 2020-08-21 – 2020-08-24 (×19): 2 via ORAL
  Filled 2020-08-20 (×5): qty 2
  Filled 2020-08-20: qty 1
  Filled 2020-08-20 (×8): qty 2
  Filled 2020-08-20: qty 1
  Filled 2020-08-20 (×5): qty 2

## 2020-08-20 MED ORDER — ALUM & MAG HYDROXIDE-SIMETH 200-200-20 MG/5ML PO SUSP: 15.0000 mL | ORAL | Status: DC | PRN

## 2020-08-20 MED ORDER — FENTANYL CITRATE (PF) 100 MCG/2ML IJ SOLN
INTRAMUSCULAR | Status: AC
  Administered 2020-08-20: 100 ug via INTRAVENOUS
  Filled 2020-08-20: qty 2

## 2020-08-20 MED ORDER — PHENOL 1.4 % MT LIQD: 1.0000 | OROMUCOSAL | Status: DC | PRN

## 2020-08-20 MED ORDER — LABETALOL HCL 5 MG/ML IV SOLN: 10.0000 mg | INTRAVENOUS | Status: DC | PRN

## 2020-08-20 MED ORDER — LISINOPRIL 20 MG PO TABS
20.0000 mg | ORAL_TABLET | Freq: Every day | ORAL | Status: DC
  Administered 2020-08-20 – 2020-08-24 (×5): 20 mg via ORAL
  Filled 2020-08-20 (×5): qty 1

## 2020-08-20 MED ORDER — VANCOMYCIN HCL IN DEXTROSE 1-5 GM/200ML-% IV SOLN
1000.0000 mg | Freq: Three times a day (TID) | INTRAVENOUS | Status: DC
  Administered 2020-08-21 – 2020-08-22 (×4): 1000 mg via INTRAVENOUS
  Filled 2020-08-20 (×8): qty 200

## 2020-08-20 MED ORDER — FENTANYL CITRATE (PF) 250 MCG/5ML IJ SOLN
INTRAMUSCULAR | Status: AC
  Filled 2020-08-20: qty 5

## 2020-08-20 MED ORDER — ACETAMINOPHEN 650 MG RE SUPP: 325.0000 mg | RECTAL | Status: DC | PRN

## 2020-08-20 MED ORDER — PROPOFOL 10 MG/ML IV BOLUS
INTRAVENOUS | Status: DC | PRN
  Administered 2020-08-20: 160 mg via INTRAVENOUS
  Administered 2020-08-20: 20 mg via INTRAVENOUS

## 2020-08-20 MED ORDER — HYDRALAZINE HCL 20 MG/ML IJ SOLN
10.0000 mg | Freq: Once | INTRAMUSCULAR | Status: AC
  Administered 2020-08-20: 10 mg via INTRAVENOUS

## 2020-08-20 MED ORDER — CLOPIDOGREL BISULFATE 75 MG PO TABS
75.0000 mg | ORAL_TABLET | Freq: Every day | ORAL | Status: DC
  Administered 2020-08-20 – 2020-08-24 (×5): 75 mg via ORAL
  Filled 2020-08-20 (×4): qty 1

## 2020-08-20 MED ORDER — CHLORHEXIDINE GLUCONATE 4 % EX LIQD: 60.0000 mL | Freq: Once | CUTANEOUS | Status: DC

## 2020-08-20 MED ORDER — PHENYLEPHRINE HCL-NACL 10-0.9 MG/250ML-% IV SOLN
INTRAVENOUS | Status: DC | PRN
  Administered 2020-08-20: 20 ug/min via INTRAVENOUS

## 2020-08-20 MED ORDER — MAGNESIUM SULFATE 2 GM/50ML IV SOLN
2.0000 g | Freq: Every day | INTRAVENOUS | Status: DC | PRN
  Filled 2020-08-20: qty 50

## 2020-08-20 MED ORDER — MIDAZOLAM HCL 2 MG/2ML IJ SOLN
INTRAMUSCULAR | Status: DC | PRN
  Administered 2020-08-20: 2 mg via INTRAVENOUS

## 2020-08-20 MED ORDER — DOCUSATE SODIUM 100 MG PO CAPS
100.0000 mg | ORAL_CAPSULE | Freq: Every day | ORAL | Status: DC
  Administered 2020-08-22 – 2020-08-23 (×2): 100 mg via ORAL
  Filled 2020-08-20 (×4): qty 1

## 2020-08-20 MED ORDER — LACTATED RINGERS IV SOLN: INTRAVENOUS | Status: DC

## 2020-08-20 MED ORDER — FENTANYL CITRATE (PF) 100 MCG/2ML IJ SOLN: 100.0000 ug | Freq: Once | INTRAMUSCULAR | Status: AC

## 2020-08-20 MED ORDER — AMISULPRIDE (ANTIEMETIC) 5 MG/2ML IV SOLN: 10.0000 mg | Freq: Once | INTRAVENOUS | Status: DC | PRN

## 2020-08-20 MED ORDER — ACETAMINOPHEN 500 MG PO TABS
1000.0000 mg | ORAL_TABLET | Freq: Once | ORAL | Status: AC
  Administered 2020-08-20: 1000 mg via ORAL
  Filled 2020-08-20: qty 2

## 2020-08-20 MED ORDER — ONDANSETRON 4 MG PO TBDP: 4.0000 mg | ORAL_TABLET | Freq: Three times a day (TID) | ORAL | Status: DC | PRN

## 2020-08-20 MED ORDER — HYDRALAZINE HCL 20 MG/ML IJ SOLN
INTRAMUSCULAR | Status: AC
  Filled 2020-08-20: qty 1

## 2020-08-20 MED ORDER — POTASSIUM CHLORIDE CRYS ER 20 MEQ PO TBCR
20.0000 meq | EXTENDED_RELEASE_TABLET | Freq: Every day | ORAL | Status: AC | PRN
  Administered 2020-08-23: 20 meq via ORAL
  Filled 2020-08-20: qty 2

## 2020-08-20 MED ORDER — ACETAMINOPHEN 325 MG PO TABS: 325.0000 mg | ORAL_TABLET | ORAL | Status: DC | PRN

## 2020-08-20 MED ORDER — PHENYLEPHRINE 40 MCG/ML (10ML) SYRINGE FOR IV PUSH (FOR BLOOD PRESSURE SUPPORT)
PREFILLED_SYRINGE | INTRAVENOUS | Status: DC | PRN
  Administered 2020-08-20: 120 ug via INTRAVENOUS

## 2020-08-20 MED ORDER — PIPERACILLIN-TAZOBACTAM 3.375 G IVPB
3.3750 g | Freq: Three times a day (TID) | INTRAVENOUS | Status: DC
  Administered 2020-08-20 – 2020-08-24 (×11): 3.375 g via INTRAVENOUS
  Filled 2020-08-20 (×11): qty 50

## 2020-08-20 MED ORDER — FENTANYL CITRATE (PF) 100 MCG/2ML IJ SOLN: 25.0000 ug | INTRAMUSCULAR | Status: DC | PRN

## 2020-08-20 MED ORDER — METHOCARBAMOL 500 MG PO TABS
500.0000 mg | ORAL_TABLET | Freq: Four times a day (QID) | ORAL | Status: DC | PRN
  Administered 2020-08-20 – 2020-08-24 (×6): 500 mg via ORAL
  Filled 2020-08-20 (×7): qty 1

## 2020-08-20 MED ORDER — DEXAMETHASONE SODIUM PHOSPHATE 10 MG/ML IJ SOLN
INTRAMUSCULAR | Status: DC | PRN
  Administered 2020-08-20: 5 mg via INTRAVENOUS

## 2020-08-20 MED ORDER — LABETALOL HCL 5 MG/ML IV SOLN
INTRAVENOUS | Status: AC
  Administered 2020-08-20: 10 mg via INTRAVENOUS
  Filled 2020-08-20: qty 4

## 2020-08-20 MED ORDER — GUAIFENESIN-DM 100-10 MG/5ML PO SYRP: 15.0000 mL | ORAL_SOLUTION | ORAL | Status: DC | PRN

## 2020-08-20 MED ORDER — MIDAZOLAM HCL 2 MG/2ML IJ SOLN
INTRAMUSCULAR | Status: AC
  Filled 2020-08-20: qty 2

## 2020-08-20 MED ORDER — ROPIVACAINE HCL 5 MG/ML IJ SOLN
INTRAMUSCULAR | Status: DC | PRN
  Administered 2020-08-20: 30 mL via PERINEURAL

## 2020-08-20 MED ORDER — 0.9 % SODIUM CHLORIDE (POUR BTL) OPTIME
TOPICAL | Status: DC | PRN
  Administered 2020-08-20: 1000 mL

## 2020-08-20 MED ORDER — PROPOFOL 500 MG/50ML IV EMUL: INTRAVENOUS | Status: DC | PRN

## 2020-08-20 MED ORDER — SODIUM CHLORIDE 0.9 % IV SOLN: INTRAVENOUS | Status: DC

## 2020-08-20 MED ORDER — FENTANYL CITRATE (PF) 250 MCG/5ML IJ SOLN
INTRAMUSCULAR | Status: DC | PRN
  Administered 2020-08-20 (×2): 50 ug via INTRAVENOUS
  Administered 2020-08-20: 25 ug via INTRAVENOUS
  Administered 2020-08-20: 50 ug via INTRAVENOUS

## 2020-08-20 MED ORDER — ONDANSETRON HCL 4 MG/2ML IJ SOLN
INTRAMUSCULAR | Status: DC | PRN
  Administered 2020-08-20: 4 mg via INTRAVENOUS

## 2020-08-20 MED ORDER — LABETALOL HCL 5 MG/ML IV SOLN: 10.0000 mg | Freq: Once | INTRAVENOUS | Status: AC

## 2020-08-20 MED ORDER — ONDANSETRON HCL 4 MG/2ML IJ SOLN: 4.0000 mg | Freq: Four times a day (QID) | INTRAMUSCULAR | Status: DC | PRN

## 2020-08-20 MED ORDER — LIDOCAINE 2% (20 MG/ML) 5 ML SYRINGE
INTRAMUSCULAR | Status: DC | PRN
  Administered 2020-08-20: 60 mg via INTRAVENOUS

## 2020-08-20 SURGICAL SUPPLY — 36 items
BLADE CORE FAN STRYKER (BLADE) ×2 IMPLANT
BLADE LONG MED 31X9 (MISCELLANEOUS) ×2 IMPLANT
BNDG ELASTIC 4X5.8 VLCR STR LF (GAUZE/BANDAGES/DRESSINGS) ×2 IMPLANT
BNDG GAUZE ELAST 4 BULKY (GAUZE/BANDAGES/DRESSINGS) ×2 IMPLANT
CANISTER SUCT 3000ML PPV (MISCELLANEOUS) ×2 IMPLANT
COVER SURGICAL LIGHT HANDLE (MISCELLANEOUS) ×4 IMPLANT
COVER WAND RF STERILE (DRAPES) IMPLANT
DRAPE HALF SHEET 40X57 (DRAPES) ×2 IMPLANT
DRSG ADAPTIC 3X8 NADH LF (GAUZE/BANDAGES/DRESSINGS) ×2 IMPLANT
DRSG EMULSION OIL 3X3 NADH (GAUZE/BANDAGES/DRESSINGS) IMPLANT
DRSG PAD ABDOMINAL 8X10 ST (GAUZE/BANDAGES/DRESSINGS) ×2 IMPLANT
ELECT REM PT RETURN 9FT ADLT (ELECTROSURGICAL) ×2
ELECTRODE REM PT RTRN 9FT ADLT (ELECTROSURGICAL) ×1 IMPLANT
GAUZE SPONGE 4X4 12PLY STRL (GAUZE/BANDAGES/DRESSINGS) ×2 IMPLANT
GAUZE SPONGE 4X4 12PLY STRL LF (GAUZE/BANDAGES/DRESSINGS) ×2 IMPLANT
GLOVE BIO SURGEON STRL SZ7.5 (GLOVE) ×2 IMPLANT
GLOVE BIOGEL PI IND STRL 8 (GLOVE) ×1 IMPLANT
GLOVE BIOGEL PI INDICATOR 8 (GLOVE) ×1
GOWN STRL REUS W/ TWL LRG LVL3 (GOWN DISPOSABLE) ×2 IMPLANT
GOWN STRL REUS W/ TWL XL LVL3 (GOWN DISPOSABLE) ×2 IMPLANT
GOWN STRL REUS W/TWL LRG LVL3 (GOWN DISPOSABLE) ×4
GOWN STRL REUS W/TWL XL LVL3 (GOWN DISPOSABLE) ×4
KIT BASIN OR (CUSTOM PROCEDURE TRAY) ×2 IMPLANT
KIT TURNOVER KIT B (KITS) ×2 IMPLANT
NS IRRIG 1000ML POUR BTL (IV SOLUTION) ×2 IMPLANT
PACK GENERAL/GYN (CUSTOM PROCEDURE TRAY) ×2 IMPLANT
PAD ARMBOARD 7.5X6 YLW CONV (MISCELLANEOUS) ×4 IMPLANT
SUT ETHILON 3 0 PS 1 (SUTURE) ×10 IMPLANT
SUT VIC AB 2-0 CT1 27 (SUTURE) ×4
SUT VIC AB 2-0 CT1 TAPERPNT 27 (SUTURE) ×2 IMPLANT
SWAB COLLECTION DEVICE MRSA (MISCELLANEOUS) ×2 IMPLANT
SWAB CULTURE ESWAB REG 1ML (MISCELLANEOUS) ×2 IMPLANT
TOWEL GREEN STERILE (TOWEL DISPOSABLE) ×4 IMPLANT
TOWEL GREEN STERILE FF (TOWEL DISPOSABLE) ×2 IMPLANT
UNDERPAD 30X36 HEAVY ABSORB (UNDERPADS AND DIAPERS) ×2 IMPLANT
WATER STERILE IRR 1000ML POUR (IV SOLUTION) ×2 IMPLANT

## 2020-08-20 NOTE — Op Note (Signed)
Date: August 20, 2020  Preoperative diagnosis: Critical limb ischemia of the left lower extremity with tissue loss including worsening gangrenous changes to the left toes  Postoperative diagnosis: Same  Procedure: Left transmetatarsal amputation (TMA)  Surgeon: Dr. [name], MD  Assistant: OR staff  Indications: Patient is a 59 year old male who was seen in follow-up earlier this week with worsening tissue loss to the left foot with gangrenous changes to the toes.  He previously had a left popliteal stent placed at [place] with a Viabahn years ago.  He underwent angiogram on 08/06/2020 that showed the left popliteal stent is patent with tibial disease and he had a peroneal angioplasty.  He presents today for planned TMA after risk benefits discussed.  Findings: On the plantar surface of the foot there was purulent drainage that was cultured.  Ultimately TMA was performed with healthy tissue margins and this was closed after it was extensively washed out.  There was decent bleeding in the tissue.  Anesthesia: LMA  Details: Patient was taken to the operating room after informed consent was obtained.  Placed on the operative table in supine position.  He did get a regional block.  Once anesthesia was induced the left foot was prepped draped usual sterile fashion.  Timeout was performed to identify patient, procedure and site.  Ultimately marked out a nice posterior flap using the plantar surface of his foot and ultimately a anterior flap where the tissue appeared healthy.  Ultimately incision was made with 15 blade scalpel and we encountered some purulent drainage on the plantar surface of the foot.  This was then sent for culture and Gram stain including aerobic and anaerobic.  Ultimately I then dissected down on the dorsum of the foot with Bovie cautery and then used a bone elevator to free up all the tissue over the metatarsal heads.  All five metatarsal bones were then transected  with oscillating saw.  I then used Bovie cautery to complete the posterior flap.  The wound was copiously irrigated and I did not see any other purulent drainage or abscess cavity.  The sesamoid bones were removed.  That point in time elected to primarily close this flap.  3-0 Vicryl's were used to loosely approximate the subcutaneous tissue.  The skin was then closed in horizontal mattress using 3-0 nylon's.  Dry sterile dressings were applied.  Complication: None  Condition: Stable  [name], MD [facility] [place] Office: [phone]   [name]

## 2020-08-20 NOTE — Anesthesia Procedure Notes (Signed)
Anesthesia Regional Block: Popliteal block   Pre-Anesthetic Checklist: ,, timeout performed, Correct Patient, Correct Site, Correct Laterality, Correct Procedure, Correct Position, site marked, Risks and benefits discussed,  Surgical consent,  Pre-op evaluation,  At surgeon's request and post-op pain management  Laterality: Left  Prep: chloraprep       Needles:  Injection technique: Single-shot  Needle Type: Echogenic Needle     Needle Length: 9cm  Needle Gauge: 21     Additional Needles:   Procedures:,,,, ultrasound used (permanent image in chart),,,,  Narrative:  Start time: 08/20/2020 11:10 AM End time: 08/20/2020 11:15 AM Injection made incrementally with aspirations every 5 mL.  Performed by: Personally  Anesthesiologist: Marcene Duos, MD

## 2020-08-20 NOTE — Progress Notes (Signed)
Pharmacy Antibiotic Note  Anthony Park is a 59 y.o. male admitted on 08/20/2020 with wound infection s/p TMA.  Pharmacy has been consulted for vancomycin and zosyn dosing.  Plan: Vancomycin 2000 mg IV x 1, then 1000 mg IV q8h Zosyn 3.375gm IV q8h (EI) Monitor for clinical course, length of therapy and deescalation.      Temp (24hrs), Avg:98.2 F (36.8 C), Min:97.4 F (36.3 C), Max:99.6 F (37.6 C)  Recent Labs  Lab 08/20/20 1049  CREATININE 0.60*    Estimated Creatinine Clearance: 125.3 mL/min (A) (by C-G formula based on SCr of 0.6 mg/dL (L)).    No Known Allergies  Antimicrobials this admission: 12/8 vancomycin >>  12/8 Zosyn >>   Dose adjustments this admission: n/a   Anthony Park A. Jeanella Craze, PharmD, BCPS, FNKF Clinical Pharmacist [place] Please utilize Amion for appropriate phone number to reach the unit pharmacist [facility])   08/20/2020 5:56 PM

## 2020-08-20 NOTE — Anesthesia Procedure Notes (Signed)
Procedure Name: LMA Insertion Date/Time: 08/20/2020 12:41 PM Performed by: Dairl Ponder, CRNA Pre-anesthesia Checklist: Patient identified, Emergency Drugs available, Suction available, Patient being monitored and Timeout performed Patient Re-evaluated:Patient Re-evaluated prior to induction Oxygen Delivery Method: Circle system utilized Preoxygenation: Pre-oxygenation with 100% oxygen Induction Type: IV induction LMA: LMA inserted LMA Size: 5.0 Number of attempts: 1 Placement Confirmation: positive ETCO2 and breath sounds checked- equal and bilateral Tube secured with: Tape Dental Injury: Teeth and Oropharynx as per pre-operative assessment

## 2020-08-20 NOTE — Anesthesia Preprocedure Evaluation (Signed)
Anesthesia Evaluation  Patient identified by MRN, date of birth, ID band Patient awake    Reviewed: Allergy & Precautions, NPO status , Patient's Chart, lab work & pertinent test results  Airway Mallampati: II  TM Distance: >3 FB     Dental  (+) Dental Advisory Given   Pulmonary Patient abstained from smoking., former smoker,    breath sounds clear to auscultation       Cardiovascular hypertension, Pt. on medications + Peripheral Vascular Disease   Rhythm:Regular Rate:Normal     Neuro/Psych negative neurological ROS     GI/Hepatic negative GI ROS, Neg liver ROS,   Endo/Other  diabetes  Renal/GU negative Renal ROS     Musculoskeletal  (+) Arthritis ,   Abdominal   Peds  Hematology negative hematology ROS (+)   Anesthesia Other Findings   Reproductive/Obstetrics                             Anesthesia Physical Anesthesia Plan  ASA: III  Anesthesia Plan: Regional and MAC   Post-op Pain Management:    Induction:   PONV Risk Score and Plan: 1 and Propofol infusion, Ondansetron and Treatment may vary due to age or medical condition  Airway Management Planned: Natural Airway and Simple Face Mask  Additional Equipment:   Intra-op Plan:   Post-operative Plan:   Informed Consent: I have reviewed the patients History and Physical, chart, labs and discussed the procedure including the risks, benefits and alternatives for the proposed anesthesia with the patient or authorized representative who has indicated his/her understanding and acceptance.     Dental advisory given  Plan Discussed with: CRNA  Anesthesia Plan Comments:         Anesthesia Quick Evaluation

## 2020-08-20 NOTE — Transfer of Care (Signed)
Immediate Anesthesia Transfer of Care Note  Patient: Anthony Park  Procedure(s) Performed: Left Transmetatarsal Ampuation (Left Foot)  Patient Location: PACU  Anesthesia Type:General and Regional  Level of Consciousness: awake, alert  and oriented  Airway & Oxygen Therapy: Patient Spontanous Breathing  Post-op Assessment: Report given to RN and Post -op Vital signs reviewed and stable  Post vital signs: Reviewed and stable  Last Vitals:  Vitals Value Taken Time  BP 143/91 08/20/20 1347  Temp    Pulse 78 08/20/20 1348  Resp 14 08/20/20 1348  SpO2 97 % 08/20/20 1348  Vitals shown include unvalidated device data.  Last Pain:  Vitals:   08/20/20 1052  TempSrc:   PainSc: 5          Complications: No complications documented.

## 2020-08-20 NOTE — H&P (Signed)
History and Physical Interval Note:  08/20/2020 12:09 PM  Anthony StageGlenn A Sarti  has presented today for surgery, with the diagnosis of Critical Limb Ischemia with tissue loss.  The various methods of treatment have been discussed with the patient and family. After consideration of risks, benefits and other options for treatment, the patient has consented to  Procedure(s): Left Transmetatarsal Ampuation (Left) as a surgical intervention.  The patient's history has been reviewed, patient examined, no change in status, stable for surgery.  I have reviewed the patient's chart and labs.  Questions were answered to the patient's satisfaction.    Left TMA.  High risk for non-healing and needing more proximal amputation.  Cephus Shellinghristopher J Arlina Sabina  Office Note     CC:  follow up Requesting Provider:  No ref. provider found  HPI: Anthony Park is a 59 y.o. (10-01-1960) male who presents for follow-up of recent arteriogram with intervention due to left lower extremity tissue loss. He had aortogram with run off on 08/06/2020 by Dr. Chestine Sporelark.  He intervened with left peroneal artery angioplasty:  "Plan: Patient will be loaded on Plavix with dual antiplatelet therapy.I will also start him on low dose liptior.I will see him in 1 month with wound check and left leg arterial duplex. High risk for limb loss given significant small vessel disease in the foot."  He presented to the hospital on 08/03/2020 with ischemic changes to the left 3rd and 5th toes and erythema of the dorsum of his left foot. He reported stubbing his foot. He was seen in consultation by Dr. Edilia Boickson. Today, he denies fever or chills. He has been putting antibiotic ointment and dry dressing on his foot. No claudication or rest pain.  His previously placed left popliteal stent was patent. [facility]. He had an 8 mm x 25 cm Viabahnstent placed from Hunter's canal down to the  distal popliteal just above the takeoff of the anterior tibial artery.   Maintained on Plavix, aspirin and statin. Previous smoker; he quit after arteriogram. His PCP physician left their practice and he has no PCP at present.      Past Medical History:  Diagnosis Date  . Arthritis   . Diabetes mellitus without complication (HCC)    type 2 controlled with diet   . Diabetic toe ulcer (HCC) 11/05/2018  . Hyperlipidemia   . Hypertension   . Peripheral vascular disease (HCC)    stent in left leg due to anerysm          Past Surgical History:  Procedure Laterality Date  . ABDOMINAL AORTOGRAM W/LOWER EXTREMITY N/A 08/06/2020   Procedure: ABDOMINAL AORTOGRAM W/LOWER EXTREMITY;  Surgeon: Cephus Shellinglark, Arletta Lumadue J, MD;  Location: [facility]ASIVE [facility];  Service: Cardiovascular;  Laterality: N/A;  . AMPUTATION Right 11/07/2018   Procedure: RIGHT 3RD TOE AMPUTATION;  Surgeon: Nadara Mustarduda, Marcus V, MD;  [facility];  Service: Orthopedics;  Laterality: Right;  . AMPUTATION TOE Left    big toe and one next to it  . CATARACT EXTRACTION Bilateral   . PERIPHERAL VASCULAR BALLOON ANGIOPLASTY Left 08/06/2020   Procedure: PERIPHERAL VASCULAR BALLOON ANGIOPLASTY;  Surgeon: Cephus Shellinglark, Analeise Mccleery J, MD;  Location: [facility] INVASIVE CV LAB;  Service: Cardiovascular;  Laterality: Left;  Peroneal artery.  . TOE AMPUTATION Left 2015  . TOTAL HIP ARTHROPLASTY Left 05/23/2020   Procedure: LEFT TOTAL HIP ARTHROPLASTY ANTERIOR APPROACH;  Surgeon: Kathryne HitchBlackman, Arnetia Bronk Y, MD;  Location: [facility] ORS;  Service: Orthopedics;  Laterality: Left;    Social History        Socioeconomic History  . Marital status: Single    Spouse name: [name]  . Number of children: 3  . Years of education: 110  . Highest education level: Not on file  Occupational History  . Not on file  Tobacco Use  . Smoking status: Current Every Day Smoker    Packs/day: 0.50    Types: Cigarettes  . Smokeless tobacco: Never Used  Vaping  Use  . Vaping Use: Never used  Substance and Sexual Activity  . Alcohol use: Yes    Alcohol/week: 7.0 standard drinks    Types: 7 Shots of liquor per week    Comment: 7 per week  . Drug use: Not Currently    Comment: hx of marijuana 15 years ago   . Sexual activity: Yes  Other Topics Concern  . Not on file  Social History Narrative  . Not on file   Social Determinants of Health      Financial Resource Strain:   . Difficulty of Paying Living Expenses: Not on file  Food Insecurity:   . Worried About Programme researcher, broadcasting/film/video in the Last Year: Not on file  . Ran Out of Food in the Last Year: Not on file  Transportation Needs:   . Lack of Transportation (Medical): Not on file  . Lack of Transportation (Non-Medical): Not on file  Physical Activity:   . Days of Exercise per Week: Not on file  . Minutes of Exercise per Session: Not on file  Stress:   . Feeling of Stress : Not on file  Social Connections:   . Frequency of Communication with Friends and Family: Not on file  . Frequency of Social Gatherings with Friends and Family: Not on file  . Attends Religious Services: Not on file  . Active Member of Clubs or Organizations: Not on file  . Attends Banker Meetings: Not on file  . Marital Status: Not on file  Intimate Partner Violence:   . Fear of Current or Ex-Partner: Not on file  . Emotionally Abused: Not on file  . Physically Abused: Not on file  . Sexually Abused: Not on file        Family History  Problem Relation Age of Onset  . COPD Mother   . Cataracts Mother   . Brain cancer Father   . Hypertension Brother           Current Outpatient Medications  Medication Sig Dispense Refill  . ascorbic acid (VITAMIN C) 500 MG tablet Take 500 mg by mouth daily.    Marland Kitchen aspirin EC 81 MG tablet Take 1 tablet (81 mg total) by mouth 2 (two) times daily after a meal. Swallow whole. 30 tablet 0  . atorvastatin (LIPITOR) 10 MG tablet Take 1 tablet (10 mg  total) by mouth daily. 30 tablet 11  . cholecalciferol (VITAMIN D3) 25 MCG (1000 UNIT) tablet Take 1,000 Units by mouth daily.    . clopidogrel (PLAVIX) 75 MG tablet Take 1 tablet (75 mg total) by mouth daily. 30 tablet 11  . GARLIC PO Take 1 tablet by mouth daily.    Marland Kitchen lisinopril (ZESTRIL) 20 MG tablet TAKE 1 TABLET BY MOUTH EVERY DAY (Patient taking differently: Take 20 mg by mouth daily. ) 90 tablet 1  . methocarbamol (ROBAXIN) 500 MG tablet Take 1 tablet (500 mg total) by mouth every 6 (six) hours as needed for muscle spasms. 40  tablet 1  . Multiple Vitamins-Minerals (MULTIVITAMIN WITH MINERALS) tablet Take 1 tablet by mouth daily.    . ondansetron (ZOFRAN ODT) 4 MG disintegrating tablet Take 1 tablet (4 mg total) by mouth every 8 (eight) hours as needed for nausea or vomiting. (Patient not taking: Reported on 08/03/2020) 20 tablet 0  . oxyCODONE (ROXICODONE) 5 MG immediate release tablet Take 1 tablet (5 mg total) by mouth every 6 (six) hours as needed for severe pain. 30 tablet 0  . Turmeric 500 MG CAPS Take 500 mg by mouth daily.     No current facility-administered medications for this visit.    No Known Allergies   REVIEW OF SYSTEMS:   [X]  denotes positive finding, [ ]  denotes negative finding Cardiac  Comments:  Chest pain or chest pressure:    Shortness of breath upon exertion:    Short of breath when lying flat:    Irregular heart rhythm:        Vascular    Pain in calf, thigh, or hip brought on by ambulation:    Pain in feet at night that wakes you up from your sleep:     Blood clot in your veins:    Leg swelling:         Pulmonary    Oxygen at home:    Productive cough:     Wheezing:         Neurologic    Sudden weakness in arms or legs:     Sudden numbness in arms or legs:     Sudden onset of difficulty speaking or slurred speech:    Temporary loss of vision in one eye:     Problems with dizziness:          Gastrointestinal    Blood in stool:     Vomited blood:         Genitourinary    Burning when urinating:     Blood in urine:        Psychiatric    Major depression:         Hematologic    Bleeding problems:    Problems with blood clotting too easily:        Skin    Rashes or ulcers:        Constitutional    Fever or chills:      PHYSICAL EXAMINATION:     Vitals:   08/19/20 1007  BP: 135/90  Pulse: 87  Resp: 20  Temp: 98.7 F (37.1 C)  SpO2: 95%   General:  WDWN in NAD; vital signs documented above Gait: no ataxia; using cane HENT: WNL, normocephalic Pulmonary: normal non-labored breathing , without Rales, rhonchi,  wheezing Cardiac: regular HR,  Skin: with rashes Vascular Exam/Pulses: Brisk Pt, Dp and peroneal Doppler signals Extremities: with ischemic changes, with Gangrene , with cellulitis; without open wounds;  Musculoskeletal: no muscle wasting or atrophy       Neurologic: A&O X 3;  No focal weakness or paresthesias are detected Psychiatric:  The pt has Normal affect.        Non-Invasive Vascular Imaging:   08/19/2020 ABI/TBIToday's ABIToday's TBIPrevious ABIPrevious TBI  +-------+-----------+-----------+------------+------------+  Right 1.24    0.75                  +-------+-----------+-----------+------------+------------+  Left  1.2    NA                   +-------+-----------+-----------+------------+------------+  Left pedal pressure may  be falsely elevated. Triphasic right waveforms   LLE duplex: Left: Patent popliteal stent. Patent peroneal artery to the ankle. Waveforms are triphasic.   ASSESSMENT/PLAN:: 59 y.o. male here for follow up for arteriogram on 08/06/2020:  Aortogram showed abdominal aortic aneurysm withtworight renal and one left renal arteries that are both patent. He has aneurysmal bilateral  common iliacs. He has diffuse arteriomegaly. I did not see any flow-limiting stenosis in the aortoiliac segment.  Left lower extremity arteriogram which was the side of interest showed a patent common femoral,profunda,SFA. His previous popliteal Viabahn stent is widely patent with patent above and below-knee popliteal artery. He has significant tibial disease with essentially single-vessel runoff via posterior tibial that is very large and robust but then occludes at the ankle. The peroneal had multiple segments of subtotal occlusion throughout the mid to distal segment. The anterior tibial occludesin the midcalf with no distal reconstitution of any dorsalis pedis or other distal target. He has significant small vessel disease in the left foot with very poor outflow.  Ultimately the left peroneal was crossed antegrade and angioplasty was performed with 3 mm Sterlingthroughout the entire segment.  Right lower extremity runoff was more sluggish and not adequately evaluated during bilateral lower extremity arteriogrambut did see a patent common femoral, profunda, and SFA.  Patient returns today with worsening appearance of left forefoot and toes.  There is cellulitis of the left foot and gangrene of left toes with the possibility of osteomyelitis involving toes and/or metatarsals.  Status post intervention of the left lower extremity.  Informed the patient of the extensive small vessel disease and inability to treat these vessels from a peripheral vascular standpoint.  I discussed the case with Dr. Chestine Spore who also evaluated the patient.  In order to preserve function of his left lower extremity and to prevent further deep tissue infection and blood infection, recommend left transmetatarsal amputation.  The patient was informed that there is the possibility he will be unable to heal the amputation site.  If unable to close this primarily wound, appropriate dressing, possibly with negative pressure wound  system would be applied.  Patient's questions are answered he is in agreement with this plan.  We will plan left TMA tomorrow with Dr. Chestine Spore.  We will initiate IV antibiotics and postoperative admission.  Continue aspirin, Plavix and statin as previously prescribed.   Milinda Antis, PA-C [facility] [phone]  Clinic MD:   Chestine Spore

## 2020-08-20 NOTE — Progress Notes (Signed)
Pt arrived to unit from PACU s/p left transmetatarsal amputation of left foot. CDI, Pt alert/oriented in no acute distress. Situated/orientated to room/equipments. Welcome guide/menu provided with instructions. And pt verbalized understanding of instructions.Valuables policy has been discussed with no complaints. Bed in lowest position with 3 side rails up,call bell/room phone within reach and all wheels locked.

## 2020-08-20 NOTE — Anesthesia Postprocedure Evaluation (Signed)
Anesthesia Post Note  Patient: Anthony Park  Procedure(s) Performed: Left Transmetatarsal Ampuation (Left Foot)     Patient location during evaluation: PACU Anesthesia Type: General Level of consciousness: awake and alert Pain management: pain level controlled Vital Signs Assessment: post-procedure vital signs reviewed and stable Respiratory status: spontaneous breathing, nonlabored ventilation, respiratory function stable and patient connected to nasal cannula oxygen Cardiovascular status: blood pressure returned to baseline and stable Postop Assessment: no apparent nausea or vomiting Anesthetic complications: no   No complications documented.  Last Vitals:  Vitals:   08/20/20 1512 08/20/20 1515  BP: (!) 168/102 (!) 177/109  Pulse: 71 72  Resp: 17 16  Temp:    SpO2: 95% 95%    Last Pain:  Vitals:   08/20/20 1515  TempSrc:   PainSc: 0-No pain                 Kennieth Rad

## 2020-08-21 ENCOUNTER — Encounter (HOSPITAL_COMMUNITY): Payer: Self-pay | Admitting: Vascular Surgery

## 2020-08-21 LAB — GLUCOSE, CAPILLARY
Glucose-Capillary: 170 mg/dL — ABNORMAL HIGH (ref 70–99)
Glucose-Capillary: 186 mg/dL — ABNORMAL HIGH (ref 70–99)
Glucose-Capillary: 158 mg/dL — ABNORMAL HIGH (ref 70–99)

## 2020-08-21 LAB — CBC
HCT: 35.5 % — ABNORMAL LOW (ref 39.0–52.0)
Hemoglobin: 11.9 g/dL — ABNORMAL LOW (ref 13.0–17.0)
MCH: 29.5 pg (ref 26.0–34.0)
MCHC: 33.5 g/dL (ref 30.0–36.0)
MCV: 87.9 fL (ref 80.0–100.0)
Platelets: 256 10*3/uL (ref 150–400)
RBC: 4.04 MIL/uL — ABNORMAL LOW (ref 4.22–5.81)
RDW: 13 % (ref 11.5–15.5)
WBC: 9.5 10*3/uL (ref 4.0–10.5)
nRBC: 0 % (ref 0.0–0.2)

## 2020-08-21 LAB — BASIC METABOLIC PANEL
Anion gap: 12 (ref 5–15)
BUN: 17 mg/dL (ref 6–20)
CO2: 23 mmol/L (ref 22–32)
Calcium: 8.8 mg/dL — ABNORMAL LOW (ref 8.9–10.3)
Chloride: 100 mmol/L (ref 98–111)
Creatinine, Ser: 0.85 mg/dL (ref 0.61–1.24)
GFR, Estimated: >60 mL/min (ref >60)
Glucose, Bld: 148 mg/dL — ABNORMAL HIGH (ref 70–99)
Potassium: 3.6 mmol/L (ref 3.5–5.1)
Sodium: 135 mmol/L (ref 135–145)

## 2020-08-21 NOTE — Progress Notes (Addendum)
  Progress Note    08/21/2020 8:50 AM 1 Day Post-Op  Subjective:  Pain overnight but better with medication   Vitals:   08/21/20 0427 08/21/20 0743  BP: 137/83 (!) 159/94  Pulse: 67 65  Resp: 16 20  Temp: 98.4 F (36.9 C) 98.6 F (37 C)  SpO2: 97% 97%   Physical Exam: Lungs:  Non labored Incisions:  L TMA site with sanguinous collection on dressing; skin edges viable Neurologic: A&O  CBC    Component Value Date/Time   WBC 9.5 08/21/2020 0314   RBC 4.04 (L) 08/21/2020 0314   HGB 11.9 (L) 08/21/2020 0314   HCT 35.5 (L) 08/21/2020 0314   PLT 256 08/21/2020 0314   MCV 87.9 08/21/2020 0314   MCH 29.5 08/21/2020 0314   MCHC 33.5 08/21/2020 0314   RDW 13.0 08/21/2020 0314   LYMPHSABS 2.3 08/03/2020 1648   MONOABS 0.9 08/03/2020 1648   EOSABS 0.0 08/03/2020 1648   BASOSABS 0.1 08/03/2020 1648    BMET    Component Value Date/Time   NA 135 08/21/2020 0314   K 3.6 08/21/2020 0314   CL 100 08/21/2020 0314   CO2 23 08/21/2020 0314   GLUCOSE 148 (H) 08/21/2020 0314   BUN 17 08/21/2020 0314   BUN 14 02/13/2014 1044   CREATININE 0.85 08/21/2020 0314   CREATININE 0.75 02/13/2014 1044   CALCIUM 8.8 (L) 08/21/2020 0314   GFRNONAA >60 08/21/2020 0314   GFRNONAA >60 02/13/2014 1044   GFRAA >60 05/20/2020 0933   GFRAA >60 02/13/2014 1044    INR    Component Value Date/Time   INR 1.09 11/06/2018 0350     Intake/Output Summary (Last 24 hours) at 08/21/2020 0850 Last data filed at 08/21/2020 0500 Gross per 24 hour  Intake 1098.43 ml  Output 425 ml  Net 673.43 ml     Assessment/Plan:  59 y.o. male is s/p L TMA 1 Day Post-Op   Dressing changed this morning PT/OT today; heel weightbearing with darco shoe Follow up cultures; continue broad spectrum for now   Emilie Rutter, PA-C [facility] [phone] 08/21/2020 8:50 AM  I have seen and evaluated the patient. I agree with the PA note as documented above. Dressing clean and dry.  Broad  spectrum antibiotics.  Awaiting OR culture results.  Pain well controlled.  [name], MD [facility] [place] Office: [phone]

## 2020-08-21 NOTE — Plan of Care (Signed)
Patient is alert and oriented x4. Got up to the bathroom with walker. Did not bear any weight on LLE. No bleeding noted. Plavix administered per order. Patient on IV antibiotics. Pain managed with percocet. Problem: Education: Goal: Knowledge of General Education information will improve Description: Including pain rating scale, medication(s)/side effects and non-pharmacologic comfort measures Outcome: Progressing   Problem: Health Behavior/Discharge Planning: Goal: Ability to manage health-related needs will improve Outcome: Progressing   Problem: Clinical Measurements: Goal: Ability to maintain clinical measurements within normal limits will improve Outcome: Progressing Goal: Will remain free from infection Outcome: Progressing Goal: Diagnostic test results will improve Outcome: Progressing Goal: Respiratory complications will improve Outcome: Progressing Goal: Cardiovascular complication will be avoided Outcome: Progressing   Problem: Activity: Goal: Risk for activity intolerance will decrease Outcome: Progressing   Problem: Nutrition: Goal: Adequate nutrition will be maintained Outcome: Progressing   Problem: Coping: Goal: Level of anxiety will decrease Outcome: Progressing   Problem: Elimination: Goal: Will not experience complications related to bowel motility Outcome: Progressing Goal: Will not experience complications related to urinary retention Outcome: Progressing   Problem: Pain Managment: Goal: General experience of comfort will improve Outcome: Progressing   Problem: Safety: Goal: Ability to remain free from injury will improve Outcome: Progressing   Problem: Skin Integrity: Goal: Risk for impaired skin integrity will decrease Outcome: Progressing

## 2020-08-21 NOTE — Evaluation (Signed)
Occupational Therapy Evaluation/Discharge Patient Details Name: Anthony Park MRN: [id number] DOB: [birth year] Today's Date: 08/21/2020    History of Present Illness Pt is a 59 y.o. male s/p L transmetatarsal amputation on 08/20/20. PMH includes L THA (05/23/20), PVD s/p toe amputations, HTN, DM, OA.   Clinical Impression   PTA, pt lives with wife who works from home and can assist pt as needed. Pt reports overall Independent with ADLs, IADLs and mobility after recovering from recent L THA. Pt reports continued numbness in L LE. Pt able to demonstrate bed mobility and sit to stand transfers with Modified Independence. Pt overall supervision for mobility with intermittent cueing of WB through heel. Pt requires Min A for LB ADLs due to difficulty reaching and managing around L LE, including donning of darco shoe. Educated pt on techniques for safe ADL completion with pt verbalizing understanding of all education. No further skilled OT services needed at acute level or on discharge. OT to sign off.     Follow Up Recommendations  No OT follow up;Supervision - Intermittent    Equipment Recommendations  None recommended by OT    Recommendations for Other Services       Precautions / Restrictions Precautions Precautions: Fall Required Braces or Orthoses: Other Brace Other Brace: L foot darco shoe Restrictions Weight Bearing Restrictions: Yes LLE Weight Bearing: Partial weight bearing LLE Partial Weight Bearing Percentage or Pounds: Wb through heel in darco shoe      Mobility Bed Mobility Overal bed mobility: Modified Independent             General bed mobility comments: Supine<>sit with HOB elevated    Transfers Overall transfer level: Modified independent Equipment used: Rolling walker (2 wheeled)             General transfer comment: reliant on momentum to power up, supervision for dynamic tasks with minor cueing on WB    Balance Overall balance assessment: Mild  deficits observed, not formally tested                                         ADL either performed or assessed with clinical judgement   ADL Overall ADL's : Needs assistance/impaired Eating/Feeding: Independent;Sitting   Grooming: Set up;Standing;Oral care Grooming Details (indicate cue type and reason): Setup for brushing teeth standing at sink with RW, no LOB Upper Body Bathing: Independent;Sitting   Lower Body Bathing: Set up;Sit to/from stand;Sitting/lateral leans   Upper Body Dressing : Independent;Sitting   Lower Body Dressing: Minimal assistance;Sit to/from stand;Sitting/lateral leans Lower Body Dressing Details (indicate cue type and reason): Min A for mgmt of darco shoe, verbalizes will be able to direct wife how to don. Wife can assist with LB dressing as needed (she previously assisted in donning socks) Toilet Transfer: Supervision/safety;Ambulation;RW Toilet Transfer Details (indicate cue type and reason): simulated in room Toileting- Clothing Manipulation and Hygiene: Set up;Sitting/lateral lean;Sit to/from stand       Functional mobility during ADLs: Rolling walker;Supervision/safety General ADL Comments: Pt with minor difficulties in being able to reach and manage ADLs around L foot and minor cues for WB through heel     Vision Baseline Vision/History: Wears glasses Wears Glasses: At all times Patient Visual Report: No change from baseline Vision Assessment?: No apparent visual deficits     Perception     Praxis      Pertinent Vitals/Pain Pain Assessment: No/denies  pain (nerve block still in effect) Faces Pain Scale: Hurts a little bit Pain Location: L lower leg Pain Descriptors / Indicators: Discomfort;Tingling Pain Intervention(s): Monitored during session;Repositioned;Ice applied     Hand Dominance Right   Extremity/Trunk Assessment Upper Extremity Assessment Upper Extremity Assessment: Overall WFL for tasks assessed   Lower  Extremity Assessment Lower Extremity Assessment: Defer to PT evaluation LLE Deficits / Details: s/p L transmet amputation with heel and foot wrapped in ace wrap; hip and knee >3/5 throughout   Cervical / Trunk Assessment Cervical / Trunk Assessment: Normal   Communication Communication Communication: No difficulties   Cognition Arousal/Alertness: Awake/alert Behavior During Therapy: WFL for tasks assessed/performed Overall Cognitive Status: Within Functional Limits for tasks assessed                                     General Comments       Exercises     Shoulder Instructions      Home Living Family/patient expects to be discharged to:: Private residence Living Arrangements: Spouse/significant other Available Help at Discharge: Family;Available 24 hours/day Type of Home: House Home Access: Stairs to enter [employer] of Steps: 2 Entrance Stairs-Rails: Left Home Layout: One level     Bathroom Shower/Tub: Producer, television/film/video: Handicapped height Bathroom Accessibility: Yes   Home Equipment: Environmental consultant - 2 wheels;Cane - single point;Hand held shower head   Additional Comments: Wife works from home, has flexible job and can assist pt as needed      Prior Functioning/Environment Level of Independence: Independent with assistive device(s)        Comments: Has been independent without DME since L THA 05/2020, recently cleared to return to work. 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        OT Problem List:        OT Treatment/Interventions:      OT Goals(Current goals can be found in the care plan section) Acute Rehab OT Goals Patient Stated Goal: heal, avoid intense pain OT Goal Formulation: All assessment and education complete, DC therapy  OT Frequency:     Barriers to D/C:            Co-evaluation              AM-PAC OT "6 Clicks" Daily Activity     Outcome Measure Help from another person eating meals?:  None Help from another person taking care of personal grooming?: None Help from another person toileting, which includes using toliet, bedpan, or urinal?: A Little Help from another person bathing (including washing, rinsing, drying)?: A Little Help from another person to put on and taking off regular upper body clothing?: None Help from another person to put on and taking off regular lower body clothing?: A Little 6 Click Score: 21   End of Session Equipment Utilized During Treatment: Rolling walker;Other (comment) (darco shoe) Nurse Communication: Mobility status  Activity Tolerance: Patient tolerated treatment well Patient left: in bed;with call bell/phone within reach;with bed alarm set                   Time: 5102-5852 OT Time Calculation (min): 23 min Charges:  OT General Charges $OT Visit: 1 Visit OT Evaluation $OT Eval Low Complexity: 1 Low OT Treatments $Self Care/Home Management : 8-22 mins  Lorre Munroe, OTR/L  Lorre Munroe 08/21/2020, 11:12 AM

## 2020-08-21 NOTE — Evaluation (Addendum)
Physical Therapy Evaluation & Discharge Patient Details Name: Anthony Park MRN: [id number] DOB: [birth year] Today's Date: 08/21/2020   History of Present Illness  Pt is a 59 y.o. male s/p L transmetatarsal amputation on 08/20/20. PMH includes L THA (05/23/20), PVD s/p toe amputations, HTN, DM, OA.   Clinical Impression  Patient evaluated by Physical Therapy with no further acute PT needs identified. PTA, pt independent and moving well since recent L THA on 05/2020, has returned to work for [employer] duty, lives with wife. Today, pt moving well with RW and L darco shoe to maintain WB thru L heel; supervision for safety. Educ re: precautions, positioning, edema control, DVT prevention, therex/ROM and importance of mobility. All education has been completed and the patient has no further questions. Acute PT is signing off. Thank you for this referral.    Follow Up Recommendations No PT follow up;Supervision for mobility/OOB    Equipment Recommendations  None recommended by PT    Recommendations for Other Services       Precautions / Restrictions Precautions Precautions: Fall Required Braces or Orthoses: Other Brace Other Brace: L foot darco shoe Restrictions Weight Bearing Restrictions: Yes LLE Weight Bearing: Partial weight bearing LLE Partial Weight Bearing Percentage or Pounds: WB thru heel in darco shoe      Mobility  Bed Mobility Overal bed mobility: Modified Independent             General bed mobility comments: Supine<>sit with HOB elevated    Transfers Overall transfer level: Modified independent Equipment used: Rolling walker (2 wheeled)             General transfer comment: Reliant on momentum to power into standing  Ambulation/Gait Ambulation/Gait assistance: Supervision Gait Distance (Feet): 40 Feet Assistive device: Rolling walker (2 wheeled) Gait Pattern/deviations: Step-to pattern;Decreased weight shift to left;Antalgic;Trunk flexed Gait velocity:  Decreased   General Gait Details: Slow, steady gait with RW and L foot darco shoe donned, supervision for safety; good ability to WB through heel in shoe  Stairs Stairs:  (pt declined formal stair training; pt able to state safe technique since performed s/p L THA; has rail support)          Wheelchair Mobility    Modified Rankin (Stroke Patients Only)       Balance Overall balance assessment: Mild deficits observed, not formally tested                                           Pertinent Vitals/Pain Pain Assessment: Faces Faces Pain Scale: Hurts a little bit Pain Location: L lower leg Pain Descriptors / Indicators: Discomfort;Tingling Pain Intervention(s): Monitored during session;Repositioned    Home Living Family/patient expects to be discharged to:: Private residence Living Arrangements: Spouse/significant other Available Help at Discharge: Family;Available 24 hours/day Type of Home: House Home Access: Stairs to enter Entrance Stairs-Rails: Left Entrance Stairs-Number of Steps: 2 Home Layout: One level Home Equipment: Walker - 2 wheels;Cane - single point      Prior Function Level of Independence: Independent with assistive device(s)         Comments: Has been independent without DME since L THA 05/2020, recently cleared 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        Extremity/Trunk Assessment   Upper Extremity Assessment Upper Extremity Assessment: Overall WFL for tasks assessed  Lower Extremity Assessment Lower Extremity Assessment: LLE deficits/detail LLE Deficits / Details: s/p L transmet amputation with heel and foot wrapped in ace wrap; hip and knee >3/5 throughout    Cervical / Trunk Assessment Cervical / Trunk Assessment: Normal  Communication   Communication: No difficulties  Cognition Arousal/Alertness: Awake/alert Behavior During Therapy: WFL for tasks  assessed/performed Overall Cognitive Status: Within Functional Limits for tasks assessed                                        General Comments      Exercises     Assessment/Plan    PT Assessment Patent does not need any further PT services  PT Problem List         PT Treatment Interventions      PT Goals (Current goals can be found in the Care Plan section)  Acute Rehab PT Goals PT Goal Formulation: All assessment and education complete, DC therapy    Frequency     Barriers to discharge        Co-evaluation               AM-PAC PT "6 Clicks" Mobility  Outcome Measure Help needed turning from your back to your side while in a flat bed without using bedrails?: None Help needed moving from lying on your back to sitting on the side of a flat bed without using bedrails?: None Help needed moving to and from a bed to a chair (including a wheelchair)?: None Help needed standing up from a chair using your arms (e.g., wheelchair or bedside chair)?: None Help needed to walk in hospital room?: A Little Help needed climbing 3-5 steps with a railing? : A Little 6 Click Score: 22    End of Session Equipment Utilized During Treatment: Gait belt Activity Tolerance: Patient tolerated treatment well Patient left: in bed;with call bell/phone within reach Nurse Communication: Mobility status PT Visit Diagnosis: Other abnormalities of gait and mobility (R26.89)    Time: 0092-3300 PT Time Calculation (min) (ACUTE ONLY): 22 min   Charges:   PT Evaluation $PT Eval Low Complexity: 1 Low     Ina Homes, PT, DPT Acute Rehabilitation Services  Pager [phone] Office [phone]  Anthony Park 08/21/2020, 10:00 AM

## 2020-08-21 NOTE — Progress Notes (Signed)
Orthopedic Tech Progress Note Patient Details:  Anthony Park December 14, 1960 [id number]  Ortho Devices Type of Ortho Device: Darco shoe Ortho Device/Splint Location: LLE Ortho Device/Splint Interventions: Ordered   Post Interventions Instructions Provided: Adjustment of device   Siraj Dermody A Edvardo Honse 08/21/2020, 9:30 AM

## 2020-08-22 LAB — GLUCOSE, CAPILLARY: Glucose-Capillary: 113 mg/dL — ABNORMAL HIGH (ref 70–99)

## 2020-08-22 MED ORDER — VANCOMYCIN HCL IN DEXTROSE 1-5 GM/200ML-% IV SOLN
1000.0000 mg | Freq: Three times a day (TID) | INTRAVENOUS | Status: DC
  Administered 2020-08-22 – 2020-08-24 (×6): 1000 mg via INTRAVENOUS
  Filled 2020-08-22 (×8): qty 200

## 2020-08-22 MED ORDER — MORPHINE SULFATE (PF) 2 MG/ML IV SOLN
2.0000 mg | INTRAVENOUS | Status: DC | PRN
  Administered 2020-08-22 – 2020-08-23 (×11): 2 mg via INTRAVENOUS
  Filled 2020-08-22 (×11): qty 1

## 2020-08-22 MED ORDER — KETOROLAC TROMETHAMINE 15 MG/ML IJ SOLN
15.0000 mg | Freq: Three times a day (TID) | INTRAMUSCULAR | Status: DC
  Administered 2020-08-22 – 2020-08-24 (×6): 15 mg via INTRAVENOUS
  Filled 2020-08-22 (×6): qty 1

## 2020-08-22 NOTE — Progress Notes (Addendum)
[facility] [place]  Subjective  - Pain issues requiring IV pain medication with PO.   Objective (!) 181/119 71 97.9 F (36.6 C) (Oral) 19 97%  Intake/Output Summary (Last 24 hours) at 08/22/2020 0732 Last data filed at 08/22/2020 0500 Gross per 24 hour  Intake 720 ml  Output 820 ml  Net -100 ml    Left TMA     Appears viable Lungs non labored breathing  Assessment/Planning: POD # 2 TMA  TMA appears viable Pending pain control and ambulation. Cont. Antibiotics, pending cultures no growth to date Afebrile without leukocytosis.  Mosetta Pigeon 08/22/2020 7:32 AM --  Laboratory Lab Results: Recent Labs    08/20/20 1856 08/21/20 0314  WBC 6.7 9.5  HGB 12.4* 11.9*  HCT 38.0* 35.5*  PLT 263 256   BMET Recent Labs    08/20/20 1049 08/20/20 1856 08/21/20 0314  NA 142  --  135  K 3.6  --  3.6  CL 107  --  100  CO2  --   --  23  GLUCOSE 168*  --  148*  BUN 16  --  17  CREATININE 0.60* 0.86 0.85  CALCIUM  --   --  8.8*    COAG Lab Results  Component Value Date   INR 1.09 11/06/2018   No results found for: PTT  I have seen and evaluated the patient. I agree with the PA note as documented above.  Status post left TMA.  Dressing removed today and looks good as pictured above.  Still awaiting cultures which have not grown anything.  On broad-spectrum IV antibiotics.  Pain control continues to be an issue and he has Percocet with IV morphine ordered for breakthrough.  I added Toradol today that will be scheduled.  [name], MD [facility] Office: [phone]

## 2020-08-22 NOTE — Plan of Care (Signed)
Patient in severe pain tonight, unable to relief with Percocet. Verba order received for IV Morphine 2mg  for breakthrough pain.  Problem: Education: Goal: Knowledge of General Education information will improve Description: Including pain rating scale, medication(s)/side effects and non-pharmacologic comfort measures Outcome: Progressing   Problem: Health Behavior/Discharge Planning: Goal: Ability to manage health-related needs will improve Outcome: Progressing   Problem: Clinical Measurements: Goal: Ability to maintain clinical measurements within normal limits will improve Outcome: Progressing Goal: Will remain free from infection Outcome: Progressing Goal: Diagnostic test results will improve Outcome: Progressing Goal: Respiratory complications will improve Outcome: Progressing Goal: Cardiovascular complication will be avoided Outcome: Progressing   Problem: Activity: Goal: Risk for activity intolerance will decrease Outcome: Progressing   Problem: Nutrition: Goal: Adequate nutrition will be maintained Outcome: Progressing   Problem: Coping: Goal: Level of anxiety will decrease Outcome: Progressing   Problem: Elimination: Goal: Will not experience complications related to bowel motility Outcome: Progressing Goal: Will not experience complications related to urinary retention Outcome: Progressing   Problem: Pain Managment: Goal: General experience of comfort will improve Outcome: Progressing   Problem: Safety: Goal: Ability to remain free from injury will improve Outcome: Progressing   Problem: Skin Integrity: Goal: Risk for impaired skin integrity will decrease Outcome: Progressing

## 2020-08-23 LAB — BASIC METABOLIC PANEL
Anion gap: 10 (ref 5–15)
BUN: 14 mg/dL (ref 6–20)
CO2: 24 mmol/L (ref 22–32)
Calcium: 8.8 mg/dL — ABNORMAL LOW (ref 8.9–10.3)
Chloride: 99 mmol/L (ref 98–111)
Creatinine, Ser: 0.77 mg/dL (ref 0.61–1.24)
GFR, Estimated: >60 mL/min (ref >60)
Glucose, Bld: 153 mg/dL — ABNORMAL HIGH (ref 70–99)
Potassium: 3.7 mmol/L (ref 3.5–5.1)
Sodium: 133 mmol/L — ABNORMAL LOW (ref 135–145)

## 2020-08-23 LAB — ACID FAST SMEAR (AFB, MYCOBACTERIA): Acid Fast Smear: NEGATIVE

## 2020-08-23 LAB — VANCOMYCIN, TROUGH: Vancomycin Tr: 14 ug/mL — ABNORMAL LOW (ref 15–20)

## 2020-08-23 LAB — MAGNESIUM: Magnesium: 1.6 mg/dL — ABNORMAL LOW (ref 1.7–2.4)

## 2020-08-23 NOTE — Progress Notes (Addendum)
  Progress Note    08/23/2020 9:27 AM 3 Days Post-Op  Subjective: Still requiring IV pain medication   Vitals:   08/23/20 0326 08/23/20 0722  BP: (!) 142/94 (!) 154/94  Pulse: 67 61  Resp: 18 17  Temp: 98.1 F (36.7 C) 97.6 F (36.4 C)  SpO2: 98% 93%   Physical Exam: Lungs:  Non labored Incisions:  Pictured below; some darkening of lateral skin edge; some erythema of dorsal foot Extremities:  ATA and PTA brisk by doppler      CBC    Component Value Date/Time   WBC 9.5 08/21/2020 0314   RBC 4.04 (L) 08/21/2020 0314   HGB 11.9 (L) 08/21/2020 0314   HCT 35.5 (L) 08/21/2020 0314   PLT 256 08/21/2020 0314   MCV 87.9 08/21/2020 0314   MCH 29.5 08/21/2020 0314   MCHC 33.5 08/21/2020 0314   RDW 13.0 08/21/2020 0314   LYMPHSABS 2.3 08/03/2020 1648   MONOABS 0.9 08/03/2020 1648   EOSABS 0.0 08/03/2020 1648   BASOSABS 0.1 08/03/2020 1648    BMET    Component Value Date/Time   NA 133 (L) 08/23/2020 0546   K 3.7 08/23/2020 0546   CL 99 08/23/2020 0546   CO2 24 08/23/2020 0546   GLUCOSE 153 (H) 08/23/2020 0546   BUN 14 08/23/2020 0546   BUN 14 02/13/2014 1044   CREATININE 0.77 08/23/2020 0546   CREATININE 0.75 02/13/2014 1044   CALCIUM 8.8 (L) 08/23/2020 0546   GFRNONAA >60 08/23/2020 0546   GFRNONAA >60 02/13/2014 1044   GFRAA >60 05/20/2020 0933   GFRAA >60 02/13/2014 1044    INR    Component Value Date/Time   INR 1.09 11/06/2018 0350     Intake/Output Summary (Last 24 hours) at 08/23/2020 0927 Last data filed at 08/23/2020 0700 Gross per 24 hour  Intake 480 ml  Output 1660 ml  Net -1180 ml     Assessment/Plan:  59 y.o. male is s/p L TMA 3 Days Post-Op   L foot perfused with ATA and PTA signal by doppler Some darkening of lateral skin edges; continue to monitor Continue broad spectrum abx for now; no growth on wound cultures Wean from IV pain medication   Emilie Rutter, PA-C [facility] [phone] 08/23/2020 9:27  AM  VASCULAR STAFF ADDENDUM: I have independently interviewed and examined the patient. I agree with the above.  Monitor lateral TMA Continue IV ABx. Await final culture data. PT / OT / OOB / Ambulate with Darco shoe Wean from pain medicine as able  Rande Brunt. Lenell Antu, MD [facility] Phone Number: [phone] 08/23/2020 12:09 PM

## 2020-08-23 NOTE — Plan of Care (Signed)
Patient is s/p left transmetatarsal amputation on 12/8. Patient ambulated to the bathroom with myself and a walker and did very well. Pain meds have been given as ordered along with the added toradol. IV antibx given as ordered. Blood cx results still pending. Will continue to monitor and continue current POC.

## 2020-08-23 NOTE — Progress Notes (Signed)
Pharmacy Antibiotic Note  Anthony Park is a 58 y.o. male admitted on 08/20/2020 with wound infection s/p TMA.  Pharmacy has been consulted for vancomycin and zosyn dosing.  Patient now s/p L metatarsal amputation on 12/8. Vanc trough within goal at 14 ug/mL. Patient remains afebrile. Last WBC WNL on 12/9. Renal function at baseline.  Plan: - Vancomycin 1000 mg IV q8h - Zosyn 3.375gm IV q8h (EI) - Monitor for clinical course, length of therapy and deescalation.      Temp (24hrs), Avg:98.1 F (36.7 C), Min:97.6 F (36.4 C), Max:98.5 F (36.9 C)  Recent Labs  Lab 08/20/20 1049 08/20/20 1856 08/21/20 0314 08/23/20 0546  WBC  --  6.7 9.5  --   CREATININE 0.60* 0.86 0.85  --   VANCOTROUGH  --   --   --  14*    Estimated Creatinine Clearance: 117.9 mL/min (by C-G formula based on SCr of 0.85 mg/dL).    No Known Allergies  Antimicrobials this admission: 12/8 vancomycin >>  12/8 Zosyn >>   Microbiology: 12/8 Mycology cx: pendng 12/8 Wound cx: NG < 2 days 12/8 Wound AF smear: negative 12/8 Fungal: negative     Anthony Park L. Arlester Marker, PharmD [facility] PGY2 Pharmacy Resident Weekends 7:00 am - 3:00 pm, please call [phone] 08/23/20      7:51 AM  Please check AMION for all [facility] Pharmacy phone numbers After 10:00 PM, call the Main Pharmacy [phone]

## 2020-08-24 LAB — BASIC METABOLIC PANEL
Anion gap: 11 (ref 5–15)
BUN: 11 mg/dL (ref 6–20)
CO2: 27 mmol/L (ref 22–32)
Calcium: 9.2 mg/dL (ref 8.9–10.3)
Chloride: 97 mmol/L — ABNORMAL LOW (ref 98–111)
Creatinine, Ser: 0.82 mg/dL (ref 0.61–1.24)
GFR, Estimated: >60 mL/min (ref >60)
Glucose, Bld: 112 mg/dL — ABNORMAL HIGH (ref 70–99)
Potassium: 4.1 mmol/L (ref 3.5–5.1)
Sodium: 135 mmol/L (ref 135–145)

## 2020-08-24 LAB — GLUCOSE, CAPILLARY: Glucose-Capillary: 157 mg/dL — ABNORMAL HIGH (ref 70–99)

## 2020-08-24 LAB — MAGNESIUM: Magnesium: 1.9 mg/dL (ref 1.7–2.4)

## 2020-08-24 MED ORDER — OXYCODONE-ACETAMINOPHEN 5-325 MG PO TABS
1.0000 | ORAL_TABLET | Freq: Four times a day (QID) | ORAL | 0 refills | Status: DC | PRN
Start: 2020-08-24 — End: 2020-08-26

## 2020-08-24 MED ORDER — AMOXICILLIN-POT CLAVULANATE 875-125 MG PO TABS: 1.0000 | ORAL_TABLET | Freq: Two times a day (BID) | ORAL | 0 refills | Status: AC

## 2020-08-24 MED ORDER — METFORMIN HCL 500 MG PO TABS: 500.0000 mg | ORAL_TABLET | Freq: Two times a day (BID) | ORAL | 1 refills | Status: DC

## 2020-08-24 NOTE — Progress Notes (Signed)
°  Progress Note    08/24/2020 11:14 AM 3 Days Post-Op  Subjective: much improved from a pain perspective. No growth to date on cultures.   Vitals:   08/24/20 0455 08/24/20 0700  BP: (!) 172/99 (!) 159/88  Pulse: 65 68  Resp: 16 18  Temp: 98.7 F (37.1 C) 98 F (36.7 C)  SpO2: 97% 95%   Physical Exam: Lungs:  Non labored Incisions:  Pictured below; some darkening of lateral skin edge; some erythema of dorsal foot Extremities:  ATA and PTA brisk by doppler      CBC    Component Value Date/Time   WBC 9.5 08/21/2020 0314   RBC 4.04 (L) 08/21/2020 0314   HGB 11.9 (L) 08/21/2020 0314   HCT 35.5 (L) 08/21/2020 0314   PLT 256 08/21/2020 0314   MCV 87.9 08/21/2020 0314   MCH 29.5 08/21/2020 0314   MCHC 33.5 08/21/2020 0314   RDW 13.0 08/21/2020 0314   LYMPHSABS 2.3 08/03/2020 1648   MONOABS 0.9 08/03/2020 1648   EOSABS 0.0 08/03/2020 1648   BASOSABS 0.1 08/03/2020 1648    BMET    Component Value Date/Time   NA 133 (L) 08/23/2020 0546   K 3.7 08/23/2020 0546   CL 99 08/23/2020 0546   CO2 24 08/23/2020 0546   GLUCOSE 153 (H) 08/23/2020 0546   BUN 14 08/23/2020 0546   BUN 14 02/13/2014 1044   CREATININE 0.77 08/23/2020 0546   CREATININE 0.75 02/13/2014 1044   CALCIUM 8.8 (L) 08/23/2020 0546   GFRNONAA >60 08/23/2020 0546   GFRNONAA >60 02/13/2014 1044   GFRAA >60 05/20/2020 0933   GFRAA >60 02/13/2014 1044    INR    Component Value Date/Time   INR 1.09 11/06/2018 0350     Intake/Output Summary (Last 24 hours) at 08/24/2020 1114 Last data filed at 08/24/2020 0600 Gross per 24 hour  Intake 3115.66 ml  Output 2900 ml  Net 215.66 ml     Assessment/Plan:  59 y.o. male is s/p L TMA 3 Days Post-Op   L foot perfused with ATA and PTA signal by doppler Some darkening of lateral skin edges; continue to monitor Paint foot with betadine daily. OK for DC today with close follow up with Augmentin, PO analgesia.   Rande Brunt. Lenell Antu, MD [facility] Phone Number: [phone] 08/24/2020 11:15 AM

## 2020-08-24 NOTE — Progress Notes (Signed)
Patient being discharged off the unit with transported provided by spouse to home.  Pt received AVS documentation with education provided by primary RN.  Pt verbalized understanding of prescribed medications and follow-up appointments.  All lines/drains removed prior to discharge with transportation off the unit provided by NT.

## 2020-08-25 LAB — AEROBIC/ANAEROBIC CULTURE W GRAM STAIN (SURGICAL/DEEP WOUND): Culture: NO GROWTH

## 2020-08-25 NOTE — Discharge Summary (Signed)
Discharge Summary  Patient ID: Anthony Park [id number] 59 y.o. 27-Sep-1960  Admit date: 08/20/2020  Discharge date and time: 08/24/2020  1:58 PM   Admitting Physician: [name], MD   Discharge Physician: same  Admission Diagnoses: PAD (peripheral artery disease) (HCC) [I73.9]  Discharge Diagnoses: same  Admission Condition: fair  Discharged Condition: fair  Indication for Admission: left foot wound  Hospital Course: [name] is a 59 year old male who was brought in as an outpatient for left transmetatarsal amputation due to nonhealing left foot wound.  This was performed by Dr. Chestine Spore on 08/20/2020 having previously performed left peroneal artery angioplasty on 08/06/2020.  The patient tolerated the procedure well and was admitted to the hospital postoperatively.  Wound cultures were taken intraoperatively however did not show any growth after 5 days.  Postoperative course consisted of weaning from IV pain medication and increasing mobility with heel weightbearing Darco shoe.  He was eventually discharged home with a 14-day course of Augmentin.  At the request of the patient he was provided a refill of his Metformin.  He was also discharged with 2 to 3 days of narcotic pain medication for continued postoperative pain control.  He will follow-up in office in about 2 weeks for incision check and possible suture removal.  He was discharged home in stable condition.  Consults: None  Treatments: surgery: L TMA by Dr. Chestine Spore 08/20/20  Discharge Exam: See progress note 12/12 Vitals:   08/24/20 0455 08/24/20 0700  BP: (!) 172/99 (!) 159/88  Pulse: 65 68  Resp: 16 18  Temp: 98.7 F (37.1 C) 98 F (36.7 C)  SpO2: 97% 95%     Disposition: Discharge disposition: 01-Home or Self Care       Patient Instructions:  Allergies as of 08/24/2020   No Known Allergies     Medication List    TAKE these medications   acetaminophen 500 MG tablet Commonly known as:  TYLENOL Take 1,000 mg by mouth every 6 (six) hours as needed for mild pain or moderate pain.   amoxicillin-clavulanate 875-125 MG tablet Commonly known as: Augmentin Take 1 tablet by mouth 2 (two) times daily for 14 days.   ascorbic acid 500 MG tablet Commonly known as: VITAMIN C Take 500 mg by mouth daily.   aspirin EC 81 MG tablet Take 1 tablet (81 mg total) by mouth 2 (two) times daily after a meal. Swallow whole. What changed: when to take this   atorvastatin 10 MG tablet Commonly known as: Lipitor Take 1 tablet (10 mg total) by mouth daily.   cholecalciferol 25 MCG (1000 UNIT) tablet Commonly known as: VITAMIN D3 Take 1,000 Units by mouth daily.   clopidogrel 75 MG tablet Commonly known as: Plavix Take 1 tablet (75 mg total) by mouth daily.   GARLIC PO Take 254 mg by mouth daily.   lisinopril 20 MG tablet Commonly known as: ZESTRIL TAKE 1 TABLET BY MOUTH EVERY DAY   metFORMIN 500 MG tablet Commonly known as: Glucophage Take 1 tablet (500 mg total) by mouth 2 (two) times daily with a meal.   oxyCODONE-acetaminophen 5-325 MG tablet Commonly known as: PERCOCET/ROXICET Take 1 tablet by mouth every 6 (six) hours as needed for moderate pain.   Turmeric 500 MG Caps Take 500 mg by mouth daily.      Activity: activity as tolerated Diet: diabetic diet Wound Care: keep wound clean and dry  Follow-up with Dr. Chestine Spore in 2 weeks.  Signed: Emilie Rutter, PA-C 08/25/2020  12:46 PM [facility] Office: [phone]

## 2020-08-26 ENCOUNTER — Ambulatory Visit (INDEPENDENT_AMBULATORY_CARE_PROVIDER_SITE_OTHER): Payer: Self-pay | Admitting: Vascular Surgery

## 2020-08-26 ENCOUNTER — Encounter (HOSPITAL_COMMUNITY): Payer: BC Managed Care – PPO

## 2020-08-26 ENCOUNTER — Other Ambulatory Visit: Payer: Self-pay

## 2020-08-26 ENCOUNTER — Encounter: Payer: Self-pay | Admitting: Vascular Surgery

## 2020-08-26 VITALS — BP 175/98 | HR 80 | Temp 97.9°F | Resp 20 | Ht 73.0 in | Wt 218.0 lb

## 2020-08-26 DIAGNOSIS — Z89432 Acquired absence of left foot: Secondary | ICD-10-CM

## 2020-08-26 MED ORDER — OXYCODONE-ACETAMINOPHEN 5-325 MG PO TABS: 1.0000 | ORAL_TABLET | Freq: Four times a day (QID) | ORAL | 0 refills | Status: DC | PRN

## 2020-08-26 NOTE — Progress Notes (Signed)
   ASSESSMENT & PLAN:  Anthony Park is a 59 y.o. male status post TMA.  Marginal lateral tissue needs continued observation and wound care. Continue betadine paint daily Continue daily dressing changes Rx percocet #15. No more narcotics. Encouraged to take ibuprofen / naproxen for pain. Return in one week for observation.  SUBJECTIVE:  Ambulating more than he should about the house after discharge. Pain continues.  OBJECTIVE:  BP (!) 175/98 (BP Location: Left Arm, Patient Position: Sitting, Cuff Size: Large)   Pulse 80   Temp 97.9 F (36.6 C)   Resp 20   Ht 6\' 1"  (1.854 m)   Wt 218 lb (98.9 kg)   SpO2 98%   BMI 28.76 kg/m       CBC Latest Ref Rng & Units 08/21/2020 08/20/2020 08/20/2020  WBC 4.0 - 10.5 K/uL 9.5 6.7 -  Hemoglobin 13.0 - 17.0 g/dL 11.9(L) 12.4(L) 13.6  Hematocrit 39.0 - 52.0 % 35.5(L) 38.0(L) 40.0  Platelets 150 - 400 K/uL 256 263 -     CMP Latest Ref Rng & Units 08/24/2020 08/23/2020 08/21/2020  Glucose 70 - 99 mg/dL 14/05/2020) 355(H) 741(U)  BUN 6 - 20 mg/dL 11 14 17   Creatinine 0.61 - 1.24 mg/dL 384(T 3.64  Sodium 135 - 145 mmol/L 135 133(L) 135  Potassium 3.5 - 5.1 mmol/L 4.1 3.7 3.6  Chloride 98 - 111 mmol/L 97(L) 99 100  CO2 22 - 32 mmol/L 27 24 23   Calcium 8.9 - 10.3 mg/dL 9.2 6.80) 3.21)  Total Protein 6.5 - 8.1 g/dL - - -  Total Bilirubin 0.3 - 1.2 mg/dL - - -  Alkaline Phos 38 - 126 U/L - - -  AST 15 - 41 U/L - - -  ALT 0 - 44 U/L - - -    Estimated Creatinine Clearance: 121.5 mL/min (by C-G formula based on SCr of 0.82 mg/dL).  . 2.2(Q, MD [facility] Phone Number: [phone] 08/26/2020 12:24 PM

## 2020-08-29 ENCOUNTER — Other Ambulatory Visit: Payer: Self-pay | Admitting: Physician Assistant

## 2020-08-29 ENCOUNTER — Telehealth: Payer: Self-pay

## 2020-08-29 NOTE — Telephone Encounter (Signed)
Patient called to request sig change on RX. Pharmacy will not refill til 12/19. Per hospital discharge paperwork - one q 6 h. Discussed with provider - request denied - patient aware.

## 2020-09-02 ENCOUNTER — Encounter: Payer: Self-pay | Admitting: Vascular Surgery

## 2020-09-02 ENCOUNTER — Other Ambulatory Visit: Payer: Self-pay

## 2020-09-02 ENCOUNTER — Ambulatory Visit (INDEPENDENT_AMBULATORY_CARE_PROVIDER_SITE_OTHER): Payer: Self-pay | Admitting: Vascular Surgery

## 2020-09-02 VITALS — BP 169/91 | HR 83 | Temp 97.0°F | Resp 18 | Ht 72.0 in | Wt 220.0 lb

## 2020-09-02 DIAGNOSIS — I739 Peripheral vascular disease, unspecified: Secondary | ICD-10-CM

## 2020-09-02 MED ORDER — DOXYCYCLINE MONOHYDRATE 100 MG PO TABS: 100.0000 mg | ORAL_TABLET | Freq: Two times a day (BID) | ORAL | 0 refills | Status: DC

## 2020-09-02 NOTE — Progress Notes (Signed)
Patient name: Anthony Park MRN: [id number] DOB: 11-24-1960 Sex: male  REASON FOR VISIT: Postop check after left TMA  HPI: Anthony Park is a 59 y.o. male who presents for postop check after left TMA. TMA was done on 08/20/2020. Prior to this he underwent left lower extremity angiogram on 08/06/20 that showed a patent left popliteal Viabahn stent placed at [facility] and we did perform a left peroneal angioplasty. He has significant tibial and small vessel disease. No immediate concerns today  Past Medical History:  Diagnosis Date  . Arthritis   . Diabetes mellitus without complication (HCC)    type 2 controlled with diet   . Diabetic toe ulcer (HCC) 11/05/2018  . History of kidney stones    passed 1 several years ago  . Hyperlipidemia   . Hypertension    not on medications  . Peripheral vascular disease (HCC)    stent in left leg due to anerysm     Past Surgical History:  Procedure Laterality Date  .  Left Transmetatarsal Ampuation (Left Foot)  08/20/2020  . ABDOMINAL AORTOGRAM W/LOWER EXTREMITY N/A 08/06/2020   Procedure: ABDOMINAL AORTOGRAM W/LOWER EXTREMITY;  Surgeon: [name], MD;  Location: [facility];  Service: Cardiovascular;  Laterality: N/A;  . AMPUTATION Right 11/07/2018   Procedure: RIGHT 3RD TOE AMPUTATION;  Surgeon: Nadara Mustard, MD;  Location: [facility] OR;  Service: Orthopedics;  Laterality: Right;  . AMPUTATION Left 08/20/2020   Procedure: Left Transmetatarsal Ampuation;  Surgeon: [name], MD;  Location[facility] OR;  Service: Vascular;  Laterality: Left;  . AMPUTATION TOE Left    big toe and one next to it  . CATARACT EXTRACTION Bilateral   . PERIPHERAL VASCULAR BALLOON ANGIOPLASTY Left 08/06/2020   Procedure: PERIPHERAL VASCULAR BALLOON ANGIOPLASTY;  Surgeon: [name], MD;  Location: [facility] INVASIVE CV LAB;  Service: Cardiovascular;  Laterality: Left;  Peroneal artery.  . TOE AMPUTATION Left 2015  . TOTAL HIP ARTHROPLASTY Left  05/23/2020   Procedure: LEFT TOTAL HIP ARTHROPLASTY ANTERIOR APPROACH;  Surgeon: Kathryne Hitch, MD;  Location: [facility] ORS;  Service: Orthopedics;  Laterality: Left;    Family History  Problem Relation Age of Onset  . COPD Mother   . Cataracts Mother   . Brain cancer Father   . Hypertension Brother     SOCIAL HISTORY: Social History   Tobacco Use  . Smoking status: Former Smoker    Packs/day: 0.50    Years: 41.00    Pack years: 20.50    Types: Cigarettes    Quit date: 07/20/2020    Years since quitting: 0.1  . Smokeless tobacco: Never Used  Substance Use Topics  . Alcohol use: Yes    Alcohol/week: 7.0 standard drinks    Types: 7 Shots of liquor per week    Comment: 7 or more a week    No Known Allergies  Current Outpatient Medications  Medication Sig Dispense Refill  . amoxicillin-clavulanate (AUGMENTIN) 875-125 MG tablet Take 1 tablet by mouth 2 (two) times daily for 14 days. 28 tablet 0  . ascorbic acid (VITAMIN C) 500 MG tablet Take 500 mg by mouth daily.    Marland Kitchen aspirin EC 81 MG tablet Take 1 tablet (81 mg total) by mouth 2 (two) times daily after a meal. Swallow whole. (Patient taking differently: Take 81 mg by mouth daily. Swallow whole.) 30 tablet 0  . atorvastatin (LIPITOR) 10 MG tablet Take 1 tablet (10 mg total) by mouth daily.  30 tablet 11  . cholecalciferol (VITAMIN D3) 25 MCG (1000 UNIT) tablet Take 1,000 Units by mouth daily.    . clopidogrel (PLAVIX) 75 MG tablet Take 1 tablet (75 mg total) by mouth daily. 30 tablet 11  . GARLIC PO Take 629 mg by mouth daily.     Marland Kitchen lisinopril (ZESTRIL) 20 MG tablet TAKE 1 TABLET BY MOUTH EVERY DAY 90 tablet 1  . metFORMIN (GLUCOPHAGE) 500 MG tablet Take 1 tablet (500 mg total) by mouth 2 (two) times daily with a meal. 60 tablet 1  . oxyCODONE-acetaminophen (PERCOCET/ROXICET) 5-325 MG tablet Take 1 tablet by mouth every 6 (six) hours as needed for moderate pain. 15 tablet 0  . Turmeric 500 MG CAPS Take 500 mg by mouth daily.      No current facility-administered medications for this visit.    REVIEW OF SYSTEMS:  [X]  denotes positive finding, [ ]  denotes negative finding Cardiac  Comments:  Chest pain or chest pressure:    Shortness of breath upon exertion:    Short of breath when lying flat:    Irregular heart rhythm:        Vascular    Pain in calf, thigh, or hip brought on by ambulation:    Pain in feet at night that wakes you up from your sleep:     Blood clot in your veins:    Leg swelling:         Pulmonary    Oxygen at home:    Productive cough:     Wheezing:         Neurologic    Sudden weakness in arms or legs:     Sudden numbness in arms or legs:     Sudden onset of difficulty speaking or slurred speech:    Temporary loss of vision in one eye:     Problems with dizziness:         Gastrointestinal    Blood in stool:     Vomited blood:         Genitourinary    Burning when urinating:     Blood in urine:        Psychiatric    Major depression:         Hematologic    Bleeding problems:    Problems with blood clotting too easily:        Skin    Rashes or ulcers:        Constitutional    Fever or chills:      PHYSICAL EXAM: Vitals:   09/02/20 1428  BP: (!) 169/91  Pulse: 83  Resp: 18  Temp: (!) 97 F (36.1 C)  TempSrc: Temporal  SpO2: 96%  Weight: 220 lb (99.8 kg)  Height: 6' (1.829 m)    GENERAL: The patient is a well-nourished male, in no acute distress. The vital signs are documented above. CARDIAC: There is a regular rate and rhythm.  VASCULAR: Brisk left PT signal Lateral TMA skin edge dry necrosis      DATA:     Assessment/Plan:  59 year old male presented with CLI with tissue loss and has undergone angiogram with tibial intervention and now TMA (his previous left popliteal stent placed at [place] was widely patent). As pictured above TMA overall looks good except for the lateral margin where he has some necrosis at the edge of the flap. In  addition there is a couple sutures that are separating. I went and cut these sutures out today in  clinic and did a little local debridement in the office and I will start packing it with a wet-to-dry. I have ordered home health. I sent him a prescription for doxycycline. I will have him follow-up with me in 2 weeks for wound check.   [name], MD [facility] Office: [phone]

## 2020-09-04 ENCOUNTER — Telehealth: Payer: Self-pay

## 2020-09-04 ENCOUNTER — Emergency Department (HOSPITAL_COMMUNITY)
Admission: EM | Admit: 2020-09-04 | Discharge: 2020-09-04 | Disposition: A | Discharge status: Home / Self Care | Payer: BC Managed Care – PPO | Attending: Emergency Medicine | Admitting: Emergency Medicine

## 2020-09-04 ENCOUNTER — Encounter (HOSPITAL_COMMUNITY): Payer: Self-pay

## 2020-09-04 DIAGNOSIS — M79672 Pain in left foot: Secondary | ICD-10-CM | POA: Insufficient documentation

## 2020-09-04 DIAGNOSIS — Z5321 Procedure and treatment not carried out due to patient leaving prior to being seen by health care provider: Secondary | ICD-10-CM | POA: Insufficient documentation

## 2020-09-04 DIAGNOSIS — R58 Hemorrhage, not elsewhere classified: Secondary | ICD-10-CM | POA: Diagnosis not present

## 2020-09-04 NOTE — ED Triage Notes (Signed)
Pt reports bleeding from his amputation site on his left foot. Pt had a partial amputation of his left foot 3 weeks ago, saw Dr Chestine Spore in his office yesterday for a follow up, had a few stitches taken out and packing placed, bleeding started this morning from the packing site. Bleeding controlled at this time.

## 2020-09-04 NOTE — ED Notes (Signed)
Pt told staff that he called his PCP and the PCP told the pt that if his bleeding was controlled then he was comfortable leaving. Pt left the lobby at 10:36 am.

## 2020-09-04 NOTE — Telephone Encounter (Signed)
Patient called - he put pressure on foot and saturated the gauze and was in a puddle of blood. Saw MD in office yesterday and several stitches from TMA site were removed. Patient has reinforced foot wrapping and is on the way to ED.

## 2020-09-04 NOTE — Telephone Encounter (Signed)
Patient went to ED, they unwrapped reinforced dressings and bleeding had stopped.  Advised that site is okay, if bleeding begins again with pressure placed on foot, reinforce dressing. Patient verbalized understanding.

## 2020-09-08 ENCOUNTER — Telehealth: Payer: Self-pay | Admitting: *Deleted

## 2020-09-08 NOTE — Telephone Encounter (Signed)
Patient called states his incision is opening up also c/o increased pain. Scheduled patient to be seen in our office by PA tomorrow . Patient verbalized understanding.

## 2020-09-09 ENCOUNTER — Other Ambulatory Visit: Payer: Self-pay

## 2020-09-09 ENCOUNTER — Ambulatory Visit (INDEPENDENT_AMBULATORY_CARE_PROVIDER_SITE_OTHER): Payer: Self-pay | Admitting: Physician Assistant

## 2020-09-09 VITALS — BP 152/92 | HR 85 | Temp 98.9°F | Resp 20 | Ht 72.0 in | Wt 229.2 lb

## 2020-09-09 DIAGNOSIS — Z89432 Acquired absence of left foot: Secondary | ICD-10-CM

## 2020-09-09 DIAGNOSIS — I739 Peripheral vascular disease, unspecified: Secondary | ICD-10-CM

## 2020-09-09 MED ORDER — OXYCODONE HCL 5 MG PO TABS: 5.0000 mg | ORAL_TABLET | Freq: Four times a day (QID) | ORAL | 0 refills | Status: DC | PRN

## 2020-09-09 NOTE — Progress Notes (Signed)
Anthony Park denies chest pain or shortness of breath. Patient denies any s/s of Covid for him or his household, also denies any s/s of Covid in his household and denies any contact with anyone with s/s of Covid.  Anthony Park will have Covid test tomorrow 09/10/20. Anthony Park has type II diabetes.  Patient reported that CBG averages 137.  I instructed patient to not take Metformin the morning of surgery. I instructed patient to check CBG after awaking and every 2 hours until arrival  to the hospital.  I Instructed patient if CBG is less than 70 to drink or 1/2 cup of a clear juice. Recheck CBG in 15 minutes then call pre- op desk at [phone] for further instructions.

## 2020-09-09 NOTE — H&P (View-Only) (Signed)
    Postoperative Visit    History of Present Illness   Anthony Park is a 59 y.o. male who presents for postoperative follow-up for: left TMA by Dr. Chestine Spore on 08/20/2020.  He has a history of left popliteal Viabahn stent done at [facility].  Most recently he had LLE angiography on 08/06/2020 by Dr. Chestine Spore which demonstrated PT a which occludes at the ankle, ATA that occludes at the mid calf, and diseased peroneal which was treated with balloon angioplasty which resulted in runoff to the foot.  At last office visit the lateral portion of the incision was opened and packed with wet-to-dry.  He was started on doxycycline.  Today he returns to the office with more dehiscence, odor, and yellowish drainage.  He states the pain to his foot has worsened since last office visit.  He denies any fevers, chills, nausea/vomiting.  He continues to take his aspirin and Plavix daily.  He is using his Darco shoe and walker to ambulate.   For [facility] Use Only   PRE-ADM LIVING: Home  AMB STATUS: Ambulatory with Assistance   Physical Examination   Vitals:   09/09/20 0908  BP: (!) 152/92  Pulse: 85  Resp: 20  Temp: 98.9 F (37.2 C)  SpO2: 97%    L foot: Fibrinous exudate in wound bed; fourth metatarsal probed to be just below necrotic tissue; peroneal and PT brisk by Doppler   Medical Decision Making   [name] is a 59 y.o. male who presents with nonhealing left TMA   Doppler exam consistent with most recent angiography; brisk peroneal and PT signal at the level of the ankle  TMA is not healing with copious fibrinous exudate and minimal coverage of fourth metatarsal head  Plan will be for debridement of left TMA in the operating room with Dr. Lenell Antu on Thursday, 09/11/2020.  The patient is aware that this is a limb threatening situation and he is at high risk for limb loss.  Continue doxycycline  Oxycodone without Tylenol prescribed to bridge the patient to surgery  Emilie Rutter PA-C [facility] [place] Office: [phone]  Clinic MD: Lenell Antu

## 2020-09-09 NOTE — Progress Notes (Signed)
    Postoperative Visit    History of Present Illness   Anthony Park is a 59 y.o. male who presents for postoperative follow-up for: left TMA by Dr. Clark on 08/20/2020.  He has a history of left popliteal Viabahn stent done at [place] [facility].  Most recently he had LLE angiography on 08/06/2020 by Dr. Clark which demonstrated PT a which occludes at the ankle, ATA that occludes at the mid calf, and diseased peroneal which was treated with balloon angioplasty which resulted in runoff to the foot.  At last office visit the lateral portion of the incision was opened and packed with wet-to-dry.  He was started on doxycycline.  Today he returns to the office with more dehiscence, odor, and yellowish drainage.  He states the pain to his foot has worsened since last office visit.  He denies any fevers, chills, nausea/vomiting.  He continues to take his aspirin and Plavix daily.  He is using his Darco shoe and walker to ambulate.   For V[facility] Use Only   PRE-ADM LIVING: Home  AMB STATUS: Ambulatory with Assistance   Physical Examination   Vitals:   09/09/20 0908  BP: (!) 152/92  Pulse: 85  Resp: 20  Temp: 98.9 F (37.2 C)  SpO2: 97%    L foot: Fibrinous exudate in wound bed; fourth metatarsal probed to be just below necrotic tissue; peroneal and PT brisk by Doppler   Medical Decision Making   Anthony Park is a 59 y.o. male who presents with nonhealing left TMA   Doppler exam consistent with most recent angiography; brisk peroneal and PT signal at the level of the ankle  TMA is not healing with copious fibrinous exudate and minimal coverage of fourth metatarsal head  Plan will be for debridement of left TMA in the operating room with Dr. Hawken on Thursday, 09/11/2020.  The patient is aware that this is a limb threatening situation and he is at high risk for limb loss.  Continue doxycycline  Oxycodone without Tylenol prescribed to bridge the patient to surgery  Margarite Vessel PA-C [facility] [place] Office: [phone]  Clinic MD: Hawken  

## 2020-09-10 ENCOUNTER — Other Ambulatory Visit (HOSPITAL_COMMUNITY)
Admission: RE | Admit: 2020-09-10 | Discharge: 2020-09-10 | Disposition: A | Discharge status: Home / Self Care | Payer: BC Managed Care – PPO | Source: Ambulatory Visit | Attending: Vascular Surgery | Admitting: Vascular Surgery

## 2020-09-10 DIAGNOSIS — Z01812 Encounter for preprocedural laboratory examination: Secondary | ICD-10-CM | POA: Insufficient documentation

## 2020-09-10 DIAGNOSIS — Z79899 Other long term (current) drug therapy: Secondary | ICD-10-CM | POA: Diagnosis not present

## 2020-09-10 DIAGNOSIS — Z20822 Contact with and (suspected) exposure to covid-19: Secondary | ICD-10-CM | POA: Insufficient documentation

## 2020-09-10 DIAGNOSIS — Z7902 Long term (current) use of antithrombotics/antiplatelets: Secondary | ICD-10-CM | POA: Diagnosis not present

## 2020-09-10 DIAGNOSIS — Z7982 Long term (current) use of aspirin: Secondary | ICD-10-CM | POA: Diagnosis not present

## 2020-09-10 DIAGNOSIS — T8781 Dehiscence of amputation stump: Secondary | ICD-10-CM | POA: Diagnosis not present

## 2020-09-10 DIAGNOSIS — Y835 Amputation of limb(s) as the cause of abnormal reaction of the patient, or of later complication, without mention of misadventure at the time of the procedure: Secondary | ICD-10-CM | POA: Diagnosis not present

## 2020-09-10 LAB — SARS CORONAVIRUS 2 (TAT 6-24 HRS): SARS Coronavirus 2: NEGATIVE

## 2020-09-11 ENCOUNTER — Telehealth: Payer: Self-pay

## 2020-09-11 ENCOUNTER — Ambulatory Visit (HOSPITAL_COMMUNITY)
Admission: RE | Admit: 2020-09-11 | Discharge: 2020-09-11 | Disposition: A | Discharge status: Home / Self Care | Payer: BC Managed Care – PPO | Attending: Vascular Surgery | Admitting: Vascular Surgery

## 2020-09-11 ENCOUNTER — Other Ambulatory Visit: Payer: Self-pay

## 2020-09-11 ENCOUNTER — Encounter (HOSPITAL_COMMUNITY): Payer: Self-pay

## 2020-09-11 ENCOUNTER — Ambulatory Visit (HOSPITAL_COMMUNITY): Payer: BC Managed Care – PPO | Admitting: Certified Registered"

## 2020-09-11 ENCOUNTER — Encounter (HOSPITAL_COMMUNITY)
Admission: RE | Disposition: A | Discharge status: Home / Self Care | Payer: Self-pay | Source: Home / Self Care | Attending: Vascular Surgery

## 2020-09-11 DIAGNOSIS — Z20822 Contact with and (suspected) exposure to covid-19: Secondary | ICD-10-CM | POA: Insufficient documentation

## 2020-09-11 DIAGNOSIS — I739 Peripheral vascular disease, unspecified: Secondary | ICD-10-CM

## 2020-09-11 DIAGNOSIS — T8781 Dehiscence of amputation stump: Secondary | ICD-10-CM | POA: Diagnosis not present

## 2020-09-11 DIAGNOSIS — T8744 Infection of amputation stump, left lower extremity: Secondary | ICD-10-CM | POA: Diagnosis not present

## 2020-09-11 DIAGNOSIS — M1990 Unspecified osteoarthritis, unspecified site: Secondary | ICD-10-CM | POA: Diagnosis not present

## 2020-09-11 DIAGNOSIS — Z89432 Acquired absence of left foot: Secondary | ICD-10-CM | POA: Insufficient documentation

## 2020-09-11 DIAGNOSIS — Z7902 Long term (current) use of antithrombotics/antiplatelets: Secondary | ICD-10-CM | POA: Diagnosis not present

## 2020-09-11 DIAGNOSIS — Z7982 Long term (current) use of aspirin: Secondary | ICD-10-CM | POA: Diagnosis not present

## 2020-09-11 DIAGNOSIS — I10 Essential (primary) hypertension: Secondary | ICD-10-CM | POA: Diagnosis not present

## 2020-09-11 DIAGNOSIS — Z79899 Other long term (current) drug therapy: Secondary | ICD-10-CM | POA: Diagnosis not present

## 2020-09-11 DIAGNOSIS — E782 Mixed hyperlipidemia: Secondary | ICD-10-CM | POA: Diagnosis not present

## 2020-09-11 DIAGNOSIS — Y835 Amputation of limb(s) as the cause of abnormal reaction of the patient, or of later complication, without mention of misadventure at the time of the procedure: Secondary | ICD-10-CM | POA: Insufficient documentation

## 2020-09-11 HISTORY — PX: WOUND DEBRIDEMENT: SHX247

## 2020-09-11 LAB — CBC
HCT: 36.9 % — ABNORMAL LOW (ref 39.0–52.0)
Hemoglobin: 12.6 g/dL — ABNORMAL LOW (ref 13.0–17.0)
MCH: 29.6 pg (ref 26.0–34.0)
MCHC: 34.1 g/dL (ref 30.0–36.0)
MCV: 86.8 fL (ref 80.0–100.0)
Platelets: 259 10*3/uL (ref 150–400)
RBC: 4.25 MIL/uL (ref 4.22–5.81)
RDW: 13.5 % (ref 11.5–15.5)
WBC: 9.4 10*3/uL (ref 4.0–10.5)
nRBC: 0 % (ref 0.0–0.2)

## 2020-09-11 LAB — GLUCOSE, CAPILLARY
Glucose-Capillary: 133 mg/dL — ABNORMAL HIGH (ref 70–99)
Glucose-Capillary: 136 mg/dL — ABNORMAL HIGH (ref 70–99)

## 2020-09-11 LAB — COMPREHENSIVE METABOLIC PANEL
ALT: 19 U/L (ref 0–44)
AST: 36 U/L (ref 15–41)
Albumin: 3.6 g/dL (ref 3.5–5.0)
Alkaline Phosphatase: 64 U/L (ref 38–126)
Anion gap: 11 (ref 5–15)
BUN: 13 mg/dL (ref 6–20)
CO2: 24 mmol/L (ref 22–32)
Calcium: 9.3 mg/dL (ref 8.9–10.3)
Chloride: 101 mmol/L (ref 98–111)
Creatinine, Ser: 0.88 mg/dL (ref 0.61–1.24)
GFR, Estimated: >60 mL/min (ref >60)
Glucose, Bld: 125 mg/dL — ABNORMAL HIGH (ref 70–99)
Potassium: 4.1 mmol/L (ref 3.5–5.1)
Sodium: 136 mmol/L (ref 135–145)
Total Bilirubin: 1 mg/dL (ref 0.3–1.2)
Total Protein: 7.1 g/dL (ref 6.5–8.1)

## 2020-09-11 SURGERY — DEBRIDEMENT, WOUND
Anesthesia: General | Site: Foot | Laterality: Left

## 2020-09-11 MED ORDER — PROMETHAZINE HCL 25 MG/ML IJ SOLN
6.2500 mg | INTRAMUSCULAR | Status: DC | PRN
Start: 2020-09-11 — End: 2020-09-11

## 2020-09-11 MED ORDER — CHLORHEXIDINE GLUCONATE 0.12 % MT SOLN
15.0000 mL | Freq: Once | OROMUCOSAL | Status: AC
  Administered 2020-09-11: 15 mL via OROMUCOSAL
  Filled 2020-09-11: qty 15

## 2020-09-11 MED ORDER — FENTANYL CITRATE (PF) 250 MCG/5ML IJ SOLN
INTRAMUSCULAR | Status: DC | PRN
  Administered 2020-09-11: 50 ug via INTRAVENOUS
  Administered 2020-09-11: 100 ug via INTRAVENOUS
  Administered 2020-09-11: 50 ug via INTRAVENOUS

## 2020-09-11 MED ORDER — ORAL CARE MOUTH RINSE: 15.0000 mL | Freq: Once | OROMUCOSAL | Status: AC

## 2020-09-11 MED ORDER — CHLORHEXIDINE GLUCONATE 4 % EX LIQD: 60.0000 mL | Freq: Once | CUTANEOUS | Status: DC

## 2020-09-11 MED ORDER — HYDROMORPHONE HCL 1 MG/ML IJ SOLN
0.2500 mg | INTRAMUSCULAR | Status: DC | PRN
  Administered 2020-09-11 (×4): 0.5 mg via INTRAVENOUS

## 2020-09-11 MED ORDER — HYDROMORPHONE HCL 1 MG/ML IJ SOLN
INTRAMUSCULAR | Status: AC
  Filled 2020-09-11: qty 1

## 2020-09-11 MED ORDER — PROPOFOL 10 MG/ML IV BOLUS
INTRAVENOUS | Status: DC | PRN
  Administered 2020-09-11: 50 mg via INTRAVENOUS
  Administered 2020-09-11: 150 mg via INTRAVENOUS

## 2020-09-11 MED ORDER — ONDANSETRON HCL 4 MG/2ML IJ SOLN
INTRAMUSCULAR | Status: DC | PRN
  Administered 2020-09-11: 4 mg via INTRAVENOUS

## 2020-09-11 MED ORDER — MIDAZOLAM HCL 2 MG/2ML IJ SOLN
INTRAMUSCULAR | Status: AC
  Filled 2020-09-11: qty 2

## 2020-09-11 MED ORDER — SODIUM CHLORIDE 0.9 % IV SOLN: INTRAVENOUS | Status: DC

## 2020-09-11 MED ORDER — MEPERIDINE HCL 25 MG/ML IJ SOLN: 6.2500 mg | INTRAMUSCULAR | Status: DC | PRN

## 2020-09-11 MED ORDER — CEFAZOLIN SODIUM-DEXTROSE 2-4 GM/100ML-% IV SOLN
2.0000 g | INTRAVENOUS | Status: AC
  Administered 2020-09-11: 2 g via INTRAVENOUS
  Filled 2020-09-11: qty 100

## 2020-09-11 MED ORDER — OXYCODONE HCL 5 MG/5ML PO SOLN: 5.0000 mg | Freq: Once | ORAL | Status: AC | PRN

## 2020-09-11 MED ORDER — LACTATED RINGERS IV SOLN: INTRAVENOUS | Status: DC

## 2020-09-11 MED ORDER — PROPOFOL 10 MG/ML IV BOLUS
INTRAVENOUS | Status: AC
  Filled 2020-09-11: qty 20

## 2020-09-11 MED ORDER — FENTANYL CITRATE (PF) 250 MCG/5ML IJ SOLN
INTRAMUSCULAR | Status: AC
  Filled 2020-09-11: qty 5

## 2020-09-11 MED ORDER — DEXAMETHASONE SODIUM PHOSPHATE 10 MG/ML IJ SOLN
INTRAMUSCULAR | Status: DC | PRN
  Administered 2020-09-11: 4 mg via INTRAVENOUS

## 2020-09-11 MED ORDER — OXYCODONE HCL 5 MG PO TABS
5.0000 mg | ORAL_TABLET | Freq: Once | ORAL | Status: AC | PRN
  Administered 2020-09-11: 5 mg via ORAL

## 2020-09-11 MED ORDER — MIDAZOLAM HCL 2 MG/2ML IJ SOLN: 0.5000 mg | Freq: Once | INTRAMUSCULAR | Status: DC | PRN

## 2020-09-11 MED ORDER — OXYCODONE HCL 5 MG PO TABS
ORAL_TABLET | ORAL | Status: AC
  Filled 2020-09-11: qty 1

## 2020-09-11 MED ORDER — MIDAZOLAM HCL 2 MG/2ML IJ SOLN
INTRAMUSCULAR | Status: DC | PRN
  Administered 2020-09-11: 2 mg via INTRAVENOUS

## 2020-09-11 MED ORDER — LIDOCAINE 2% (20 MG/ML) 5 ML SYRINGE
INTRAMUSCULAR | Status: DC | PRN
  Administered 2020-09-11: 30 mg via INTRAVENOUS

## 2020-09-11 SURGICAL SUPPLY — 39 items
BLADE AVERAGE 25X9 (BLADE) IMPLANT
BNDG CONFORM 3 STRL LF (GAUZE/BANDAGES/DRESSINGS) ×2 IMPLANT
BNDG ELASTIC 4X5.8 VLCR STR LF (GAUZE/BANDAGES/DRESSINGS) ×2 IMPLANT
BNDG GAUZE ELAST 4 BULKY (GAUZE/BANDAGES/DRESSINGS) ×2 IMPLANT
CANISTER SUCT 3000ML PPV (MISCELLANEOUS) ×2 IMPLANT
CLEANER TIP ELECTROSURG 2X2 (MISCELLANEOUS) ×2 IMPLANT
COVER SURGICAL LIGHT HANDLE (MISCELLANEOUS) ×2 IMPLANT
COVER WAND RF STERILE (DRAPES) ×2 IMPLANT
DRAPE EXTREMITY T 121X128X90 (DISPOSABLE) ×2 IMPLANT
DRAPE HALF SHEET 40X57 (DRAPES) ×2 IMPLANT
DRAPE ORTHO SPLIT 77X108 STRL (DRAPES) ×2
DRAPE SURG ORHT 6 SPLT 77X108 (DRAPES) ×2 IMPLANT
ELECT REM PT RETURN 9FT ADLT (ELECTROSURGICAL) ×2
ELECTRODE REM PT RTRN 9FT ADLT (ELECTROSURGICAL) ×1 IMPLANT
GAUZE SPONGE 4X4 12PLY STRL (GAUZE/BANDAGES/DRESSINGS) ×2 IMPLANT
GAUZE SPONGE 4X4 12PLY STRL LF (GAUZE/BANDAGES/DRESSINGS) ×2 IMPLANT
GAUZE XEROFORM 5X9 LF (GAUZE/BANDAGES/DRESSINGS) ×2 IMPLANT
GLOVE SURG SS PI 8.0 STRL IVOR (GLOVE) ×2 IMPLANT
GOWN STRL REUS W/ TWL LRG LVL3 (GOWN DISPOSABLE) ×2 IMPLANT
GOWN STRL REUS W/ TWL XL LVL3 (GOWN DISPOSABLE) ×1 IMPLANT
GOWN STRL REUS W/TWL LRG LVL3 (GOWN DISPOSABLE) ×2
GOWN STRL REUS W/TWL XL LVL3 (GOWN DISPOSABLE) ×1
KIT BASIN OR (CUSTOM PROCEDURE TRAY) ×2 IMPLANT
KIT TURNOVER KIT B (KITS) ×2 IMPLANT
NS IRRIG 1000ML POUR BTL (IV SOLUTION) ×2 IMPLANT
PACK GENERAL/GYN (CUSTOM PROCEDURE TRAY) ×2 IMPLANT
PAD ABD 8X10 STRL (GAUZE/BANDAGES/DRESSINGS) ×2 IMPLANT
PAD ARMBOARD 7.5X6 YLW CONV (MISCELLANEOUS) ×4 IMPLANT
PENCIL SMOKE EVACUATOR (MISCELLANEOUS) ×2 IMPLANT
STAPLER VISISTAT 35W (STAPLE) ×2 IMPLANT
SUT ETHILON 2 0 PSLX (SUTURE) ×2 IMPLANT
SUT ETHILON 3 0 PS 1 (SUTURE) ×2 IMPLANT
SUT SILK 2 0SH CR/8 30 (SUTURE) IMPLANT
SUT VIC AB 2-0 CT1 18 (SUTURE) ×4 IMPLANT
SUT VIC AB 3-0 SH 8-18 (SUTURE) ×2 IMPLANT
TOWEL GREEN STERILE (TOWEL DISPOSABLE) ×4 IMPLANT
TOWEL GREEN STERILE FF (TOWEL DISPOSABLE) ×2 IMPLANT
UNDERPAD 30X36 HEAVY ABSORB (UNDERPADS AND DIAPERS) ×2 IMPLANT
WATER STERILE IRR 1000ML POUR (IV SOLUTION) ×2 IMPLANT

## 2020-09-11 NOTE — Anesthesia Procedure Notes (Signed)
Procedure Name: LMA Insertion Date/Time: 09/11/2020 10:06 AM Performed by: De Nurse, CRNA Pre-anesthesia Checklist: Patient identified, Emergency Drugs available, Suction available and Patient being monitored Patient Re-evaluated:Patient Re-evaluated prior to induction Oxygen Delivery Method: Circle System Utilized Preoxygenation: Pre-oxygenation with 100% oxygen Induction Type: IV induction Ventilation: Mask ventilation without difficulty LMA: LMA inserted LMA Size: 5.0 Number of attempts: 1 Placement Confirmation: positive ETCO2 Tube secured with: Tape Dental Injury: Teeth and Oropharynx as per pre-operative assessment

## 2020-09-11 NOTE — Telephone Encounter (Signed)
Filled out and faxed [employer] paperwork to [facility], confirmed receipt of fax with her and informed PACU.

## 2020-09-11 NOTE — TOC Initial Note (Addendum)
Transition of Care [facility]) - Initial/Assessment Note    Patient Details  Name: Anthony Park MRN: [id number] Date of Birth: [birth year]  Transition of Care [facility]) CM/SW Contact:    Elliot Cousin, RN Phone Number:  [phone]  09/11/2020, 3:14 PM  Clinical Narrative:                 TOC CM spoke to pt via phone. Offered choice for [facility]. Discussed HH and states he had [facility] in the past. Explained [facility] declined due to staffing issues.   [facility] declined due to staffing.  [facility]  Referral sent to [facility], waiting confirmation.   [facility]declined  09/15/2020 548 pm Contacted [facility], [facility] and they accepted referral. Contacted pt with update and provided their contact number.   Expected Discharge Plan: Home w Home Health Services Barriers to Discharge: No Barriers Identified   Patient Goals and CMS Choice Patient states their goals for this hospitalization and ongoing recovery are:: Wife will be able to assist with dressing changes at home CMS Medicare.gov Compare Post Acute Care list provided to:: Patient Choice offered to / list presented to : Patient  Expected Discharge Plan and Services Expected Discharge Plan: Home w Home Health Services In-house Referral: Clinical Social Work Discharge Planning Services: CM Consult Post Acute Care Choice: Home Health Living arrangements for the past 2 months: Single Family Home Expected Discharge Date: 09/11/20                 DME Agency: [employer]       HH Arranged: RN          Prior Living Arrangements/Services Living arrangements for the past 2 months: Single Family Home Lives with:: Spouse Patient language and need for interpreter reviewed:: Yes Do you feel safe going back to the place where you live?: Yes      Need for Family Participation in Patient Care: Yes (Comment) Care giver support system in place?: Yes (comment)   Criminal Activity/Legal  Involvement Pertinent to Current Situation/Hospitalization: No - Comment as needed  Activities of Daily Living Home Assistive Devices/Equipment: CBG Meter,Eyeglasses,Walker (specify type) ADL Screening (condition at time of admission) Patient's cognitive ability adequate to safely complete daily activities?: Yes Is the patient deaf or have difficulty hearing?: No Does the patient have difficulty seeing, even when wearing glasses/contacts?: No Does the patient have difficulty concentrating, remembering, or making decisions?: No Patient able to express need for assistance with ADLs?: Yes Does the patient have difficulty dressing or bathing?: No Independently performs ADLs?: Yes (appropriate for developmental age) Does the patient have difficulty walking or climbing stairs?: Yes Weakness of Legs: None Weakness of Arms/Hands: None  Permission Sought/Granted Permission sought to share information with : Case Manager,PCP,Family Supports Permission granted to share information with : Yes, Verbal Permission Granted  Share Information with NAME: Sterling Big  Permission granted to share info w AGENCY: Home Health, [facility]  Permission granted to share info w Relationship: significant other  Permission granted to share info w Contact Information: [phone]  Emotional Assessment       Orientation: : Oriented to Self,Oriented to Place,Oriented to  Time,Oriented to Situation   Psych Involvement: No (comment)  Admission diagnosis:  Non-healing surgical wound Patient Active Problem List   Diagnosis Date Noted  . History of amputation of left foot through metatarsal bone (HCC) 09/11/2020  . PAD (peripheral artery disease) (HCC) 08/20/2020  . Status post total replacement of  left hip 06/05/2020  . Unilateral primary osteoarthritis, right hip 04/17/2020  . Unilateral primary osteoarthritis, left hip 04/17/2020  . Amputated toe, right (HCC) 11/22/2018  . Type 2 diabetes mellitus with foot ulcer,  without long-term current use of insulin (HCC) 11/22/2018  . Essential hypertension 11/22/2018  . Mixed hyperlipidemia 11/22/2018  . Morbid obesity (HCC) 11/22/2018  . Aneurysm of left popliteal artery (HCC) 11/22/2018  . Status post peripheral artery angioplasty with insertion of stent 11/22/2018  . Encounter for smoking cessation counseling 11/22/2018  . Osteomyelitis of third toe of right foot (HCC)   . Cellulitis of third toe of right foot    PCP:  Patient, No Pcp Per Pharmacy:   [facility] [phone] [place], [place] - [address] [place] [address] Phone: [phone] Fax: [phone]     Social Determinants of Health (SDOH) Interventions    Readmission Risk Interventions No flowsheet data found.

## 2020-09-11 NOTE — Op Note (Addendum)
DATE OF SERVICE: 09/11/2020  PATIENT:  Anthony Park  59 y.o. male  PRE-OPERATIVE DIAGNOSIS:  Dehiscence of L TMA stump  POST-OPERATIVE DIAGNOSIS:  Same  PROCEDURE:   Sharp excisional debridement of L TMA wound (16 x 4 x 4 cm wound)  SURGEON:  Surgeon(s) and Role:    * Leonie Douglas, MD - Primary  ASSISTANT: none  ANESTHESIA:   general  EBL: min  BLOOD ADMINISTERED:none  DRAINS: none   LOCAL MEDICATIONS USED:  NONE  SPECIMEN:  Culture from surgical bed  COUNTS: confirmed correct.  TOURNIQUET:  * No tourniquets in log *  PATIENT DISPOSITION:  PACU - hemodynamically stable.   Delay start of Pharmacological VTE agent (>24hrs) due to surgical blood loss or risk of bleeding: no  INDICATION FOR PROCEDURE: SAVVA BEAMER is a 59 y.o. male with dehiscence of L TMA stump. After careful discussion of risks, benefits, and alternatives the patient was offered debridement of wound. We specifically discussed risk of poor healing. The patient understood and wished to proceed.  OPERATIVE FINDINGS: moderate bleeding tissue encountered after debridement of fibrinous debris at base of wound. Closed soft tissue over bone. Left skin open. Wet-to-dry applied to wound.   DESCRIPTION OF PROCEDURE: After identification of the patient in the pre-operative holding area, the patient was transferred to the operating room. The patient was positioned supine on the operating room table. Anesthesia was induced. The left lower extremity was prepped and draped in standard fashion. A surgical pause was performed confirming correct patient, procedure, and operative location.  The previously placed suture material was debrided sharply. The wound bed was covered in fibrinous exudate and poor bleeding was noted. The fifth metatarsal was debrided back 2 cm. The soft tissue overlying the remaining metatarsals was mobilized. The wound bed was debrided to healthy, bleeding tissue. Moderate bleeding was  encountered. The cut ends of the metatarsals were covered with soft tissue using 2-O vicryl. The wound was left open and packed wet-to-dry.  Upon completion of the case instrument and sharps counts were confirmed correct. The patient was transferred to the  PACU in good condition. I was present for all portions of the procedure.  Rande Brunt. Lenell Antu, MD [facility] Phone Number: [phone] 09/11/2020 10:33 AM

## 2020-09-11 NOTE — Transfer of Care (Signed)
Immediate Anesthesia Transfer of Care Note  Patient: Anthony Park  Procedure(s) Performed: DEBRIDEMENT OF LEFT TRANSMETATARSAL WOUND (Left Foot)  Patient Location: PACU  Anesthesia Type:General  Level of Consciousness: awake, alert  and oriented  Airway & Oxygen Therapy: Patient Spontanous Breathing  Post-op Assessment: Report given to RN  Post vital signs: Reviewed and stable  Last Vitals:  Vitals Value Taken Time  BP 142/96 09/11/20 1041  Temp    Pulse    Resp 15 09/11/20 1043  SpO2    Vitals shown include unvalidated device data.  Last Pain:  Vitals:   09/11/20 0821  TempSrc:   PainSc: 5       Patients Stated Pain Goal: 3 (09/11/20 9798)  Complications: No complications documented.

## 2020-09-11 NOTE — Anesthesia Preprocedure Evaluation (Addendum)
Anesthesia Evaluation  Patient identified by MRN, date of birth, ID band Patient awake    Reviewed: Allergy & Precautions, NPO status , Patient's Chart, lab work & pertinent test results  History of Anesthesia Complications Negative for: history of anesthetic complications  Airway Mallampati: I  TM Distance: >3 FB Neck ROM: Full    Dental  (+) Dental Advisory Given   Pulmonary former smoker,  09/10/2020 SARS coronavirus NEG   breath sounds clear to auscultation       Cardiovascular hypertension, Pt. on medications (-) angina+ Peripheral Vascular Disease   Rhythm:Regular Rate:Normal     Neuro/Psych Diabetic peripheral neuropathy    GI/Hepatic negative GI ROS, Neg liver ROS,   Endo/Other  diabetes (glu 133), Oral Hypoglycemic AgentsMorbid obesity  Renal/GU negative Renal ROS     Musculoskeletal  (+) Arthritis ,   Abdominal (+) + obese,   Peds  Hematology negative hematology ROS (+)   Anesthesia Other Findings   Reproductive/Obstetrics                            Anesthesia Physical Anesthesia Plan  ASA: III  Anesthesia Plan: General   Post-op Pain Management:    Induction: Intravenous  PONV Risk Score and Plan: 2 and Ondansetron and Dexamethasone  Airway Management Planned: Oral ETT  Additional Equipment: None  Intra-op Plan:   Post-operative Plan:   Informed Consent: I have reviewed the patients History and Physical, chart, labs and discussed the procedure including the risks, benefits and alternatives for the proposed anesthesia with the patient or authorized representative who has indicated his/her understanding and acceptance.     Dental advisory given  Plan Discussed with: CRNA and Surgeon  Anesthesia Plan Comments:         Anesthesia Quick Evaluation

## 2020-09-11 NOTE — Progress Notes (Signed)
Waiting for home health consult. No further monitoring required.   Jacobo Forest, RN

## 2020-09-11 NOTE — Anesthesia Postprocedure Evaluation (Signed)
Anesthesia Post Note  Patient: Anthony Park  Procedure(s) Performed: DEBRIDEMENT OF LEFT TRANSMETATARSAL WOUND (Left Foot)     Patient location during evaluation: PACU Anesthesia Type: General Level of consciousness: awake and alert, patient cooperative and oriented Pain management: pain level controlled Vital Signs Assessment: post-procedure vital signs reviewed and stable Respiratory status: spontaneous breathing, nonlabored ventilation and respiratory function stable Cardiovascular status: blood pressure returned to baseline and stable Postop Assessment: no apparent nausea or vomiting and adequate PO intake Anesthetic complications: no   No complications documented.  Last Vitals:  Vitals:   09/11/20 1140 09/11/20 1210  BP:    Pulse: 71 72  Resp: 17 20  Temp: 36.4 C   SpO2: 97% 97%    Last Pain:  Vitals:   09/11/20 1140  TempSrc:   PainSc: 9                  Adarrius Graeff,E. Efton Thomley

## 2020-09-11 NOTE — Interval H&P Note (Signed)
History and Physical Interval Note:  09/11/2020 9:45 AM  [name]  has presented today for surgery, with the diagnosis of Non-healing surgical wound.  The various methods of treatment have been discussed with the patient and family. After consideration of risks, benefits and other options for treatment, the patient has consented to  Procedure(s): DEBRIDEMENT OF Left TRANSMETATARSAL WOUND (Left) as a surgical intervention.  The patient's history has been reviewed, patient examined, no change in status, stable for surgery.  I have reviewed the patient's chart and labs.  Questions were answered to the patient's satisfaction.     Anthony Park

## 2020-09-12 ENCOUNTER — Encounter (HOSPITAL_COMMUNITY): Payer: Self-pay | Admitting: Vascular Surgery

## 2020-09-12 ENCOUNTER — Other Ambulatory Visit: Payer: Self-pay | Admitting: Vascular Surgery

## 2020-09-12 MED ORDER — CIPROFLOXACIN HCL 500 MG PO TABS: 500.0000 mg | ORAL_TABLET | Freq: Two times a day (BID) | ORAL | 0 refills | Status: DC

## 2020-09-12 MED ORDER — OXYCODONE HCL 5 MG PO TABS: 5.0000 mg | ORAL_TABLET | Freq: Four times a day (QID) | ORAL | 0 refills | Status: DC | PRN

## 2020-09-12 NOTE — Progress Notes (Signed)
Culture data shows Pseudomonas in foot. Called patient. Called in Cipro Rx. Will ask office to arrange ID evaluation. Refill percocet Rx.  [facility]

## 2020-09-13 LAB — ACID FAST SMEAR (AFB, MYCOBACTERIA): Acid Fast Smear: NEGATIVE

## 2020-09-15 ENCOUNTER — Telehealth: Payer: Self-pay | Admitting: *Deleted

## 2020-09-15 ENCOUNTER — Telehealth: Payer: Self-pay

## 2020-09-15 NOTE — Telephone Encounter (Signed)
I reached out to patient to advise that I've reached out to [facility], Case manager RN regarding his HH orders. Patient states he currently doesn't have the wound vac & he has yet to receive the supplies. He states he is currently following the instruction provided by Dr. Lenell Antu and that he has reached out to [employer] requesting his wound vac supplies getting shipped out sooner. I advised that once I hear back from [place]. Patient voiced his understanding.

## 2020-09-15 NOTE — Telephone Encounter (Signed)
TOC CM contacted [facility] and they are able to accept referral. Will contact pt to arrange [facility] RN Visit. Isidoro Donning RN CCM, [facility] ED TOC CM [phone]

## 2020-09-15 NOTE — Telephone Encounter (Signed)
TOC CM spoke to pt to make aware that [facility], [facility], [place] and [facility] have declined referral. Will continue to search for [facility] agency. Received message from [place] at MD office, [phone]. Update her on status to [facility] agencies. They will arrange for pt to come into office on tomorrow when he receives wound vac to have placed from office. Isidoro Donning RN CCM, [facility] ED TOC CM [phone]

## 2020-09-15 NOTE — Telephone Encounter (Signed)
I spoke with Cathlean Cower regarding HH orders. She advised that patient has been declined by [facility], [facility] & [facility]. She will call the other agencies to see if they can service pt.  I reached out to patient to let him know I could scheduled him to come into the office to have wound vac placed once he receives his supplies. Patient states he was still having concerns on how the wound looks and concerns about if him and his wife were doing the wound dressings correctly. I advised patient that I would place him on the schedule for a wound check and if the supplies haven't arrived then he would just need to come back once received if [facility] hasn't been set up. Patient states he does not mind coming several days in a row and will keep the  Scheduled appt on 09/16/20 at 8:30am. Patient voiced his understanding.

## 2020-09-16 ENCOUNTER — Telehealth: Payer: Self-pay | Admitting: *Deleted

## 2020-09-16 ENCOUNTER — Ambulatory Visit (INDEPENDENT_AMBULATORY_CARE_PROVIDER_SITE_OTHER): Payer: Self-pay | Admitting: Physician Assistant

## 2020-09-16 ENCOUNTER — Encounter: Payer: Self-pay | Admitting: Physician Assistant

## 2020-09-16 ENCOUNTER — Telehealth: Payer: Self-pay

## 2020-09-16 ENCOUNTER — Other Ambulatory Visit: Payer: Self-pay

## 2020-09-16 ENCOUNTER — Ambulatory Visit: Payer: BC Managed Care – PPO | Admitting: Vascular Surgery

## 2020-09-16 DIAGNOSIS — T8189XA Other complications of procedures, not elsewhere classified, initial encounter: Secondary | ICD-10-CM | POA: Diagnosis not present

## 2020-09-16 DIAGNOSIS — I739 Peripheral vascular disease, unspecified: Secondary | ICD-10-CM

## 2020-09-16 LAB — AEROBIC/ANAEROBIC CULTURE W GRAM STAIN (SURGICAL/DEEP WOUND)

## 2020-09-16 NOTE — Progress Notes (Signed)
  POST OPERATIVE OFFICE NOTE    CC:  F/u for surgery  HPI:  This is a 60 y.o. male who is s/p  TMA on 08/20/20 by Dr. Chestine Spore.  This was then followed with debridement on 12/30 for non healing incision.  Cultures grew  MODERATE PSEUDOMONAS AERUGINOSA and RARE ESCHERICHIA COLI .  He was placed on oral Ciprofloxacin BID.    His wife has been performing wet to dry dressing changes BID.  They are waiting on a wound vac to be delivered and [facility] RN to place vac.     Pt returns today for follow up wound check today.  He denise fever and chills.  He reports edema in B LE and pain in the left foot.      No Known Allergies  Current Outpatient Medications  Medication Sig Dispense Refill  . acetaminophen (TYLENOL) 500 MG tablet Take 1,000 mg by mouth every 6 (six) hours as needed for moderate pain.    Marland Kitchen ascorbic acid (VITAMIN C) 500 MG tablet Take 500 mg by mouth daily.    Marland Kitchen aspirin EC 81 MG tablet Take 1 tablet (81 mg total) by mouth 2 (two) times daily after a meal. Swallow whole. (Patient taking differently: Take 81 mg by mouth daily. Swallow whole.) 30 tablet 0  . atorvastatin (LIPITOR) 10 MG tablet Take 1 tablet (10 mg total) by mouth daily. 30 tablet 11  . cholecalciferol (VITAMIN D3) 25 MCG (1000 UNIT) tablet Take 1,000 Units by mouth daily.    . ciprofloxacin (CIPRO) 500 MG tablet Take 1 tablet (500 mg total) by mouth 2 (two) times daily. 20 tablet 0  . clopidogrel (PLAVIX) 75 MG tablet Take 1 tablet (75 mg total) by mouth daily. 30 tablet 11  . doxycycline (ADOXA) 100 MG tablet Take 1 tablet (100 mg total) by mouth 2 (two) times daily. 14 tablet 0  . GARLIC PO Take 962 mg by mouth daily.     Marland Kitchen lisinopril (ZESTRIL) 20 MG tablet TAKE 1 TABLET BY MOUTH EVERY DAY (Patient taking differently: Take 20 mg by mouth daily.) 90 tablet 1  . metFORMIN (GLUCOPHAGE) 500 MG tablet Take 1 tablet (500 mg total) by mouth 2 (two) times daily with a meal. 60 tablet 1  . oxyCODONE (OXY IR/ROXICODONE) 5 MG immediate  release tablet Take 1 tablet (5 mg total) by mouth every 6 (six) hours as needed for severe pain. 20 tablet 0  . Turmeric 500 MG CAPS Take 500 mg by mouth daily.     No current facility-administered medications for this visit.     ROS:  See HPI  Physical Exam:        Yellow eschar was debrided in office today.  Doppler signals DP/PT/Peroneal intact to the ankle.  Assessment/Plan:  This is a 60 y.o. male who is s/p: TMA with return to the OR for dbridement   Pending wound vac placement.  Heel weight bearing with Darco shoe, elevation when at rest.  Continue BID wet to dry until vac placement.  F/U next week for wound check.  Continiue Cipro BID for infection.  He has stopped smoking now for 45+ days.   Dr. Lenell Antu examined the wound and performed debridement of the lateral corner for yellow eschar.    Mosetta Pigeon PA-C [facility] [phone]  Clinic MD:  Steve Rattler

## 2020-09-16 NOTE — Telephone Encounter (Signed)
Informed patient that we can not give him any more pain medicine, or change the dosage at this time.   Informed patient that he could have his wound vac placed today instead of tomorrow. Pt is coming to office today.

## 2020-09-16 NOTE — Telephone Encounter (Signed)
Patient called to report HH will come and do wound vac changes, but will not apply the vac initially. The vac is scheduled to be delivered to his home today. He will come to [facility] tomorrow after 1330 and we will apply the vac. Patient verbalizes understanding.

## 2020-09-16 NOTE — Telephone Encounter (Signed)
I contacted patient to inform him of the [facility] Accepting his HH orders. He informed me that he has spoken with them and is planning to schedule them to come out tomorrow. He has yet to receive the wound vac but states it should be arriving today. He also voiced concerns about not having enough pain meds and he is still having pains after taking the oxycodone 5mg  and tylenol 2 hours after. He is requesting another pain med to be sent in as a break through medication for his pain or an increase on the amount of times he is able to take the pain meds daily. He states the last time he took the Oxy was around 8am today and by 10 am he was having to take tylenol & has yet to have any relief. Per Dr. he is unable to give any additional pain meds.   've spoke with Lenell Antu, RN about calls [facility] has received from patient. They would like for our office to reach out to him and explain they are unable to come out due staffing issues. She is trying to prevent patient from being discharge from this [facility] service due to the excessive calls and the way he has spoken to the scheduling staff. Please Advise.

## 2020-09-17 ENCOUNTER — Other Ambulatory Visit: Payer: Self-pay | Admitting: Physician Assistant

## 2020-09-17 DIAGNOSIS — T8189XA Other complications of procedures, not elsewhere classified, initial encounter: Secondary | ICD-10-CM | POA: Diagnosis not present

## 2020-09-17 LAB — FUNGUS CULTURE WITH STAIN

## 2020-09-17 LAB — FUNGAL ORGANISM REFLEX

## 2020-09-17 LAB — FUNGUS CULTURE RESULT

## 2020-09-17 NOTE — Telephone Encounter (Signed)
Must see primary care provider for diabetic medications.

## 2020-09-18 DIAGNOSIS — T8189XA Other complications of procedures, not elsewhere classified, initial encounter: Secondary | ICD-10-CM | POA: Diagnosis not present

## 2020-09-19 DIAGNOSIS — T8189XA Other complications of procedures, not elsewhere classified, initial encounter: Secondary | ICD-10-CM | POA: Diagnosis not present

## 2020-09-20 DIAGNOSIS — T8189XA Other complications of procedures, not elsewhere classified, initial encounter: Secondary | ICD-10-CM | POA: Diagnosis not present

## 2020-09-21 DIAGNOSIS — T8189XA Other complications of procedures, not elsewhere classified, initial encounter: Secondary | ICD-10-CM | POA: Diagnosis not present

## 2020-09-22 DIAGNOSIS — T8189XA Other complications of procedures, not elsewhere classified, initial encounter: Secondary | ICD-10-CM | POA: Diagnosis not present

## 2020-09-23 ENCOUNTER — Ambulatory Visit (INDEPENDENT_AMBULATORY_CARE_PROVIDER_SITE_OTHER): Payer: Self-pay | Admitting: Physician Assistant

## 2020-09-23 ENCOUNTER — Other Ambulatory Visit: Payer: Self-pay

## 2020-09-23 ENCOUNTER — Ambulatory Visit (INDEPENDENT_AMBULATORY_CARE_PROVIDER_SITE_OTHER): Payer: BC Managed Care – PPO | Admitting: Internal Medicine

## 2020-09-23 VITALS — BP 173/86 | HR 90 | Temp 98.1°F | Resp 20 | Ht 72.0 in | Wt 241.7 lb

## 2020-09-23 DIAGNOSIS — M8660 Other chronic osteomyelitis, unspecified site: Secondary | ICD-10-CM

## 2020-09-23 DIAGNOSIS — E11628 Type 2 diabetes mellitus with other skin complications: Secondary | ICD-10-CM | POA: Diagnosis not present

## 2020-09-23 DIAGNOSIS — I739 Peripheral vascular disease, unspecified: Secondary | ICD-10-CM

## 2020-09-23 DIAGNOSIS — L089 Local infection of the skin and subcutaneous tissue, unspecified: Secondary | ICD-10-CM

## 2020-09-23 DIAGNOSIS — Z89432 Acquired absence of left foot: Secondary | ICD-10-CM

## 2020-09-23 DIAGNOSIS — T8189XA Other complications of procedures, not elsewhere classified, initial encounter: Secondary | ICD-10-CM | POA: Diagnosis not present

## 2020-09-23 MED ORDER — LISINOPRIL 20 MG PO TABS: 20.0000 mg | ORAL_TABLET | Freq: Every day | ORAL | 0 refills | Status: DC

## 2020-09-23 MED ORDER — MELOXICAM 7.5 MG PO TABS: 7.5000 mg | ORAL_TABLET | Freq: Every day | ORAL | 1 refills | Status: DC

## 2020-09-23 NOTE — Progress Notes (Signed)
Office Note     CC:  follow up Requesting Provider:  No ref. provider found  HPI: Anthony Park is a 60 y.o. ([birth year]) male who presents for follow up of TMA performed on 08/20/20 by Dr. Chestine Sporelark.  This was then followed with debridement on 12/30 for non healing incision.  Cultures grew  pseudomonas aeruginosa and e. Coli. At time of last visit on 09/16/20 he was taking Cipro BID x 10 days. His wife has been performing wet to dry dressing changes BID. They were still waiting on HH and wound VAC to be placed. Dr. Lenell AntuHawken assisted PA Lianne CureMaureen Collins on debridement of the left foot in the office at the time of his visit otherwise the wound was well appearing and healing as anticipated.  Pt returns today for follow up wound check today.  He denise fever and chills.  He reports edema in BLE and pain in the left foot. He continues to have left foot pain that occurs mostly when lying down but also with weight bearing. He has been trying not to weight bear on the left foot as much because of this. He additionally was having increased pain in bilateral heels/ Achilles area with taking Cipro. This has improved some since being off of Cipro since Sunday once he completed the course of abx. He has been alternating Tylenol and Aleve for pain. He does elevate a little during the day but only in recliner  He had follow up with Infectious Disease Specialist, Dr. Renold DonVu, earlier today. He has ordered IV cefepime for a 6 week course for his osteo with pseudomonas and e. Coli. He is scheduled to have PICC line placed on 09/29/20. He will follow up with Dr. Renold DonVu in 3-4 weeks  The pt is on a statin for cholesterol management.  The pt is on a daily aspirin.   Other AC: plavix The pt is on ACE for hypertension.   The pt is diabetic.  Tobacco hx:  Former, recently quit approximately 1.5 months ago  Past Medical History:  Diagnosis Date  . Arthritis   . Diabetes mellitus without complication (HCC)    type 2 controlled with  diet   . Diabetic toe ulcer (HCC) 11/05/2018  . History of kidney stones    passed 1 several years ago  . Hyperlipidemia   . Hypertension    not on medications  . Peripheral vascular disease (HCC)    stent in left leg due to anerysm     Past Surgical History:  Procedure Laterality Date  .  Left Transmetatarsal Ampuation (Left Foot)  08/20/2020  . ABDOMINAL AORTOGRAM W/LOWER EXTREMITY N/A 08/06/2020   Procedure: ABDOMINAL AORTOGRAM W/LOWER EXTREMITY;  Surgeon: Cephus Shellinglark, Christopher J, MD;  Location: [facility];  Service: Cardiovascular;  Laterality: N/A;  . AMPUTATION Right 11/07/2018   Procedure: RIGHT 3RD TOE AMPUTATION;  Surgeon: Nadara Mustarduda, Marcus V, MD;  [facility];  Service: Orthopedics;  Laterality: Right;  . AMPUTATION Left 08/20/2020   Procedure: Left Transmetatarsal Ampuation;  Surgeon: Cephus Shellinglark, Christopher J, MD;  [facility];  Service: Vascular;  Laterality: Left;  . AMPUTATION TOE Left    big toe and one next to it  . CATARACT EXTRACTION Bilateral   . PERIPHERAL VASCULAR BALLOON ANGIOPLASTY Left 08/06/2020   Procedure: PERIPHERAL VASCULAR BALLOON ANGIOPLASTY;  Surgeon: Cephus Shellinglark, Christopher J, MD;  Location: [facility];  Service: Cardiovascular;  Laterality: Left;  Peroneal artery.  . TOE AMPUTATION Left 2015  . TOTAL HIP  ARTHROPLASTY Left 05/23/2020   Procedure: LEFT TOTAL HIP ARTHROPLASTY ANTERIOR APPROACH;  Surgeon: Kathryne Hitch, MD;  Location: [facility];  Service: Orthopedics;  Laterality: Left;  . WOUND DEBRIDEMENT Left 09/11/2020   Procedure: DEBRIDEMENT OF LEFT TRANSMETATARSAL WOUND;  Surgeon: Leonie Douglas, MD;  Location: [facility];  Service: Vascular;  Laterality: Left;    Social History   Socioeconomic History  . Marital status: Single    Spouse name: Anthony Park  . Number of children: 3  . Years of education: 53  . Highest education level: Not on file  Occupational History  . Not on file  Tobacco Use  . Smoking status: Former Smoker     Packs/day: 0.50    Years: 41.00    Pack years: 20.50    Types: Cigarettes    Quit date: 07/20/2020    Years since quitting: 0.1  . Smokeless tobacco: Never Used  Vaping Use  . Vaping Use: Never used  Substance and Sexual Activity  . Alcohol use: Yes    Alcohol/week: 7.0 standard drinks    Types: 7 Shots of liquor per week    Comment: 7 or more a week  . Drug use: Not Currently    Comment: hx of marijuana 15 years ago   . Sexual activity: Yes  Other Topics Concern  . Not on file  Social History Narrative  . Not on file   Social Determinants of Health   Financial Resource Strain: Not on file  Food Insecurity: Not on file  Transportation Needs: Not on file  Physical Activity: Not on file  Stress: Not on file  Social Connections: Not on file  Intimate Partner Violence: Not on file    Family History  Problem Relation Age of Onset  . COPD Mother   . Cataracts Mother   . Brain cancer Father   . Hypertension Brother     Current Outpatient Medications  Medication Sig Dispense Refill  . acetaminophen (TYLENOL) 500 MG tablet Take 1,000 mg by mouth every 6 (six) hours as needed for moderate pain.    Marland Kitchen ascorbic acid (VITAMIN C) 500 MG tablet Take 500 mg by mouth daily.    Marland Kitchen aspirin EC 81 MG tablet Take 1 tablet (81 mg total) by mouth 2 (two) times daily after a meal. Swallow whole. (Patient taking differently: Take 81 mg by mouth daily. Swallow whole.) 30 tablet 0  . atorvastatin (LIPITOR) 10 MG tablet Take 1 tablet (10 mg total) by mouth daily. 30 tablet 11  . cholecalciferol (VITAMIN D3) 25 MCG (1000 UNIT) tablet Take 1,000 Units by mouth daily.    . ciprofloxacin (CIPRO) 500 MG tablet Take 1 tablet (500 mg total) by mouth 2 (two) times daily. (Patient not taking: Reported on 09/23/2020) 20 tablet 0  . clopidogrel (PLAVIX) 75 MG tablet Take 1 tablet (75 mg total) by mouth daily. 30 tablet 11  . doxycycline (ADOXA) 100 MG tablet Take 1 tablet (100 mg total) by mouth 2 (two) times  daily. (Patient not taking: Reported on 09/23/2020) 14 tablet 0  . GARLIC PO Take 671 mg by mouth daily.     Marland Kitchen lisinopril (ZESTRIL) 20 MG tablet TAKE 1 TABLET BY MOUTH EVERY DAY (Patient taking differently: Take 20 mg by mouth daily.) 90 tablet 1  . metFORMIN (GLUCOPHAGE) 500 MG tablet Take 1 tablet (500 mg total) by mouth 2 (two) times daily with a meal. 60 tablet 1  . oxyCODONE (OXY IR/ROXICODONE) 5 MG immediate release tablet Take  1 tablet (5 mg total) by mouth every 6 (six) hours as needed for severe pain. 20 tablet 0  . Turmeric 500 MG CAPS Take 500 mg by mouth daily.     No current facility-administered medications for this visit.    No Known Allergies   REVIEW OF SYSTEMS:  [X]  denotes positive finding, [ ]  denotes negative finding Cardiac  Comments:  Chest pain or chest pressure:    Shortness of breath upon exertion:    Short of breath when lying flat:    Irregular heart rhythm:        Vascular    Pain in calf, thigh, or hip brought on by ambulation:    Pain in feet at night that wakes you up from your sleep:     Blood clot in your veins:    Leg swelling:         Pulmonary    Oxygen at home:    Productive cough:     Wheezing:         Neurologic    Sudden weakness in arms or legs:     Sudden numbness in arms or legs:     Sudden onset of difficulty speaking or slurred speech:    Temporary loss of vision in one eye:     Problems with dizziness:         Gastrointestinal    Blood in stool:     Vomited blood:         Genitourinary    Burning when urinating:     Blood in urine:        Psychiatric    Major depression:         Hematologic    Bleeding problems:    Problems with blood clotting too easily:        Skin    Rashes or ulcers:        Constitutional    Fever or chills:      PHYSICAL EXAMINATION:  Vitals:   09/23/20 1409  BP: (!) 173/86  Pulse: 90  Resp: 20  Temp: 98.1 F (36.7 C)  TempSrc: Temporal  SpO2: 98%  Weight: 241 lb 11.2 oz (109.6  kg)  Height: 6' (1.829 m)    General:  WDWN in NAD; vital signs documented above Gait: Ambulates with rolling walker HENT: WNL, normocephalic Pulmonary: normal non-labored breathing  Cardiac: regular HR Vascular Exam/Pulses: Brisk DP/ PT signals in left foot Extremities: without ischemic changes, without Gangrene , without cellulitis; with open TMA wound        Some slough in wound bed but otherwise pink granulation tissue. The skin edges on all sides especially the medial and lateral aspects there is maceration with epidermolysis. He additionally has a blister on the heel and there is a laceration or skin crack present as well. No surrounding erythema. No drainage. Bilateral lower extremities edematous Musculoskeletal: no muscle wasting or atrophy  Neurologic: A&O X 3;  No focal weakness or paresthesias are detected Psychiatric:  The pt has Normal affect.   ASSESSMENT/PLAN:: 60 y.o. male here for follow up for TMA performed on 08/20/20 by Dr. 41.  This was then followed with debridement on 12/30 for non healing incision. The wound has been stable and healing as anticipated. He has been coming to the office for wound VAC changes. New VAC placed today. Some maceration of wound edges with skin epidermolysis. If this progresses may go back to wet to dry dressings over weekend to allow wound  to get some air.   - Elevate lower extremities when sitting and avoid constant pressure to left heel - he will wear Darco shoe when weight bearing  - Continue Asa, statin, and Plavix - I sent prescription for Meloxicam 7.5 mg BID #20 1 refill. I advised him to alternate with tylenol - He also requested refill on his Lisinopril because he is unable to get into is PCP office until beginning of February so I gave him 30 days with no refills -Continue follow up and Abx per ID - He will follow up on Friday for wound VAC change - Next provider follow up will be on 09/30/20 with Dr. Chestine Spore with wound [facility]  Change   Graceann Congress, PA-C [facility] [phone]  Clinic MD:  Dr. Chestine Spore

## 2020-09-23 NOTE — Progress Notes (Signed)
Written orders faxed to short stay per Dr. Renold Don Cefepime 2 grams IV once for first dosing ICD 10 code: M86.60 for Chronic Osteomyelitis  Referral faxed to [facility] with written orders per Dr. Rich Fuchs RN  IV administration and teaching and  Picc line care with biopatch  Cefepime 2 grams IV for 6 weeks after first dose Weekly CBC, CMP, CRP (fax results to [phone])  Patient scheduled for Picc line placement and first does on 09/29/20 patient made aware of new appointment date and time during office visit.  Valarie Cones

## 2020-09-23 NOTE — Progress Notes (Signed)
[facility]  Reason for Consult:left diabetic foot infection/om Referring Provider: hospital/vascular surgery referal    Patient Active Problem List   Diagnosis Date Noted  . History of amputation of left foot through metatarsal bone ([employer]) 09/11/2020  . PAD (peripheral artery disease) ([place]) 08/20/2020  . Status post total replacement of left hip 06/05/2020  . Unilateral primary osteoarthritis, right hip 04/17/2020  . Unilateral primary osteoarthritis, left hip 04/17/2020  . Amputated toe, right ([place]) 11/22/2018  . Type 2 diabetes mellitus with foot ulcer, without long-term current use of insulin ([place]) 11/22/2018  . Essential hypertension 11/22/2018  . Mixed hyperlipidemia 11/22/2018  . Morbid obesity ([place]) 11/22/2018  . Aneurysm of left popliteal artery ([place]) 11/22/2018  . Status post peripheral artery angioplasty with insertion of stent 11/22/2018  . Encounter for smoking cessation counseling 11/22/2018  . Osteomyelitis of third toe of right foot ([place])   . Cellulitis of third toe of right foot       HPI: Anthony Park is a 60 y.o. male vasculopath, dm2, quit smoking (40 days before this visit), here for new referal from vascular surgery for left diabetic foot infection  I reviewed operative/h&P note from vascular surgery/admissions from 12/07 and 12/30  He has diabetic foot neuropathy. He has had recurrent feet ulcer and s/p 2 toes resection the left foot the past 5 years. He developed ulcer again on the left foot 1 sometimes early 07/2020.   He has been seeing vascular surgery. No f/c. He was given abx as below Doxy 12/7 to 1/01 cipro 1/01-10 days  12/07 tma with primary closure 12/30 repeat I&D for wound dehiscence. Reviewed op note, 5th metatarsal base also debrided. cx pseudomonas/ecoli S cipro. Not esbl ecoli preivous 12/07 wound cx negative No pathology done No primary closure was done; he has been having a wound vac to help  healing  Patient is in moderate 7/10 pain, getting better since surgery. He has good days/bad days. No f/c. No diarrhea/rash. No muscle weakness  He does report bilateral heel pain since before taking the cipro, but worse since  Patient is not currently working   Patient has had trouble getting home health to help with home wound dressing. He has to go to clinic every other days for it  He is to see vascular surgery this afternoon. The wound is "looking as expected" per his vascular surgery team discussion with patient on f/u   Of note, he was not previously seen by id No hx chf He does have chronic leg swelling both sides, but seems to be worse on cipro  He has no antibiotics allergy No sob/rash/n/v/diarrhea  He also reports having had angioplasty LLE just prior to the foot surgery   Review of Systems: ROS Other ros negative      Past Medical History:  Diagnosis Date  . Arthritis   . Diabetes mellitus without complication ([place])    type 2 controlled with diet   . Diabetic toe ulcer ([place]) 11/05/2018  . History of kidney stones    passed 1 several years ago  . Hyperlipidemia   . Hypertension    not on medications  . Peripheral vascular disease ([place])    stent in left leg due to anerysm     Social History   Tobacco Use  . Smoking status: Former Smoker    Packs/day: 0.50    Years: 41.00    Pack years: 20.50  Types: Cigarettes    Quit date: 07/20/2020    Years since quitting: 0.1  . Smokeless tobacco: Never Used  Vaping Use  . Vaping Use: Never used  Substance Use Topics  . Alcohol use: Yes    Alcohol/week: 7.0 standard drinks    Types: 7 Shots of liquor per week    Comment: 7 or more a week  . Drug use: Not Currently    Comment: hx of marijuana 15 years ago     Family History  Problem Relation Age of Onset  . COPD Mother   . Cataracts Mother   . Brain cancer Father   . Hypertension Brother     No Known Allergies  OBJECTIVE: There were no  vitals filed for this visit. There is no height or weight on file to calculate BMI.   Physical Exam General: conversant, mild distress complaining of pain left foot; left foot in stiff sole boot Heent: atraumatic; per; conj clear; eomi Neck supple: cv rrr no mrg Lungs clear; normal respiratory effort abd s/nt Ext 1+ edema bilateral (chronic per patient) Skin no rash Neuro: cn2-12 intact; strength reflex intact Psych: alert/oriented Msk: s/p left tma; Sloughing of skin around lateral edge of wound; wound vac on; no surrounding tenderness/erythena/fluctuance; mild edema up to calf bilaterally. The wound vac chamber is mostly serosanguinous fluid with some debrides  Lab: 12/30 cr 0.9; lft wnl; cbc 9/13/259  Microbiology: 12/22 surgical wound cx including 5th mt bone scraping pseudomonas & ecoli (Sensitive to cipro; ecoli to cipro/bactrim and non esbl)  Serology:  Imaging: none  Assessment/plan: 60 yo previous smoker, vasculopathy, dm2 recent tma 12/7 left foot for dfi, complicated by nonwound healing repeat I&d with 5th mt bone scraping here for new ID referal for management  No imaging, but duration and description of op note I would presume he has osteomyelitis of 5th mt, therefore will treat as such  He has increased bilateral heel pain since taking cipro so will avoid it  -iv cefepime 6 weeks from 1/10 until 11/03/2020 -picc ordered -hh to be setup; weekly cbc, cmp, crp -f/u 3-4 weeks -f/u vascular surgery as planned    Problem List Items Addressed This Visit   None   Visit Diagnoses    Chronic osteomyelitis ([place])    -  Primary   Relevant Orders   CBC   Comp Met (CMET)   C-reactive protein   IR Fluoro Guide CV Line Left   Diabetic foot infection ([place])       Relevant Orders   CBC   Comp Met (CMET)   C-reactive protein   IR Fluoro Guide CV Line Left       I am having Jayveon A. Regala maintain his lisinopril, ascorbic acid, cholecalciferol, Turmeric, aspirin  EC, GARLIC PO, atorvastatin, clopidogrel, metFORMIN, doxycycline, acetaminophen, ciprofloxacin, and oxyCODONE.     Follow-up: No follow-ups on file.  Jabier Mutton, [place] [facility] [place] -- -- pager   [phone] cell 09/23/2020, 10:12 AM

## 2020-09-23 NOTE — Patient Instructions (Addendum)
You have bone infection of the left foot   There is no alternative to cipro, which can make you have pain in your tendon  I would treat you with 6 weeks of antibiotics because of the bone infection. As there is no alternative to cipro, I would need to do cefepime   Will set you up with an iv line (picc) and home health for antibiotics. Our plan is to do cefepime 2 gram intravenous twice daily for 6 weeks until 10/28/2020. You'll have weekly blood draw to monitor your kidney/liver for drug toxicity along with inflammatory markers for monitoring progress of the infection  Please follow up with me 2-3 weeks after you start treatment

## 2020-09-24 ENCOUNTER — Other Ambulatory Visit: Payer: Self-pay

## 2020-09-24 ENCOUNTER — Telehealth: Payer: Self-pay

## 2020-09-24 DIAGNOSIS — T8189XA Other complications of procedures, not elsewhere classified, initial encounter: Secondary | ICD-10-CM | POA: Diagnosis not present

## 2020-09-24 LAB — CBC
HCT: 29.5 % — ABNORMAL LOW (ref 38.5–50.0)
Hemoglobin: 10 g/dL — ABNORMAL LOW (ref 13.2–17.1)
MCH: 29 pg (ref 27.0–33.0)
MCHC: 33.9 g/dL (ref 32.0–36.0)
MCV: 85.5 fL (ref 80.0–100.0)
MPV: 9.3 fL (ref 7.5–12.5)
Platelets: 298 10*3/uL (ref 140–400)
RBC: 3.45 10*6/uL — ABNORMAL LOW (ref 4.20–5.80)
RDW: 13.3 % (ref 11.0–15.0)
WBC: 11.2 10*3/uL — ABNORMAL HIGH (ref 3.8–10.8)

## 2020-09-24 LAB — COMPREHENSIVE METABOLIC PANEL
AG Ratio: 1.4 (calc) (ref 1.0–2.5)
ALT: 14 U/L (ref 9–46)
AST: 26 U/L (ref 10–35)
Albumin: 3.7 g/dL (ref 3.6–5.1)
Alkaline phosphatase (APISO): 54 U/L (ref 35–144)
BUN: 11 mg/dL (ref 7–25)
CO2: 26 mmol/L (ref 20–32)
Calcium: 9.5 mg/dL (ref 8.6–10.3)
Chloride: 107 mmol/L (ref 98–110)
Creat: 0.75 mg/dL (ref 0.70–1.33)
Globulin: 2.6 g/dL (calc) (ref 1.9–3.7)
Glucose, Bld: 121 mg/dL — ABNORMAL HIGH (ref 65–99)
Potassium: 4.3 mmol/L (ref 3.5–5.3)
Sodium: 143 mmol/L (ref 135–146)
Total Bilirubin: 0.2 mg/dL (ref 0.2–1.2)
Total Protein: 6.3 g/dL (ref 6.1–8.1)

## 2020-09-24 LAB — C-REACTIVE PROTEIN: CRP: 53.7 mg/L — ABNORMAL HIGH (ref <8.0)

## 2020-09-24 NOTE — Telephone Encounter (Signed)
Unable to secure home health orders for wound care. All agencies that were called denied case due to staffing or insurance. Pt is following up in office for wound care.

## 2020-09-25 DIAGNOSIS — T8189XA Other complications of procedures, not elsewhere classified, initial encounter: Secondary | ICD-10-CM | POA: Diagnosis not present

## 2020-09-26 ENCOUNTER — Other Ambulatory Visit: Payer: Self-pay

## 2020-09-26 ENCOUNTER — Other Ambulatory Visit (HOSPITAL_COMMUNITY): Payer: Self-pay

## 2020-09-26 ENCOUNTER — Ambulatory Visit (INDEPENDENT_AMBULATORY_CARE_PROVIDER_SITE_OTHER): Payer: BC Managed Care – PPO | Admitting: Physician Assistant

## 2020-09-26 DIAGNOSIS — T8189XD Other complications of procedures, not elsewhere classified, subsequent encounter: Secondary | ICD-10-CM

## 2020-09-26 DIAGNOSIS — Z89432 Acquired absence of left foot: Secondary | ICD-10-CM

## 2020-09-26 DIAGNOSIS — T8189XA Other complications of procedures, not elsewhere classified, initial encounter: Secondary | ICD-10-CM | POA: Diagnosis not present

## 2020-09-26 DIAGNOSIS — L03116 Cellulitis of left lower limb: Secondary | ICD-10-CM | POA: Diagnosis not present

## 2020-09-26 NOTE — Progress Notes (Signed)
  POST OPERATIVE OFFICE NOTE    CC:  F/u for surgery  HPI:  This is a 60 y.o. male who is s/p left transmetatarsal amputation and subsequent debridement.  He presents today for wound VAC change.  He states he is to have PICC line inserted on Monday and intravenous antibiotic therapy initiated.  Denies fever or chills.  States ongoing edema seems some better today.  He tries to elevate is much as possible but he has increased pain with elevation.  No Known Allergies  Current Outpatient Medications  Medication Sig Dispense Refill  . acetaminophen (TYLENOL) 500 MG tablet Take 1,000 mg by mouth every 6 (six) hours as needed for moderate pain.    Marland Kitchen ascorbic acid (VITAMIN C) 500 MG tablet Take 500 mg by mouth daily.    Marland Kitchen aspirin EC 81 MG tablet Take 1 tablet (81 mg total) by mouth 2 (two) times daily after a meal. Swallow whole. (Patient taking differently: Take 81 mg by mouth daily. Swallow whole.) 30 tablet 0  . atorvastatin (LIPITOR) 10 MG tablet Take 1 tablet (10 mg total) by mouth daily. 30 tablet 11  . cholecalciferol (VITAMIN D3) 25 MCG (1000 UNIT) tablet Take 1,000 Units by mouth daily.    . ciprofloxacin (CIPRO) 500 MG tablet Take 1 tablet (500 mg total) by mouth 2 (two) times daily. (Patient not taking: No sig reported) 20 tablet 0  . clopidogrel (PLAVIX) 75 MG tablet Take 1 tablet (75 mg total) by mouth daily. 30 tablet 11  . doxycycline (ADOXA) 100 MG tablet Take 1 tablet (100 mg total) by mouth 2 (two) times daily. (Patient not taking: Reported on 09/23/2020) 14 tablet 0  . GARLIC PO Take 182 mg by mouth daily.     Marland Kitchen lisinopril (ZESTRIL) 20 MG tablet TAKE 1 TABLET BY MOUTH EVERY DAY (Patient taking differently: Take 20 mg by mouth daily.) 90 tablet 1  . lisinopril (ZESTRIL) 20 MG tablet Take 1 tablet (20 mg total) by mouth daily. 30 tablet 0  . meloxicam (MOBIC) 7.5 MG tablet Take 1 tablet (7.5 mg total) by mouth daily. 20 tablet 1  . metFORMIN (GLUCOPHAGE) 500 MG tablet Take 1 tablet  (500 mg total) by mouth 2 (two) times daily with a meal. 60 tablet 1  . oxyCODONE (OXY IR/ROXICODONE) 5 MG immediate release tablet Take 1 tablet (5 mg total) by mouth every 6 (six) hours as needed for severe pain. 20 tablet 0  . Turmeric 500 MG CAPS Take 500 mg by mouth daily.     No current facility-administered medications for this visit.     ROS:  See HPI  There were no vitals taken for this visit.  Physical Exam:  General appearance:in NAD Respiratory: nonlabored Extremities:  Wound VAC removed. Malodorous. Wound bed without granulation. Peri-wound skin is macerated from wound VAC drape  Assessment/Plan:  This is a 60 y.o. male who is s/p:left TMA with poor healing. Damp to dry sterile saline dressing applied. Instructed his wife to do this four times a day. He has appointment with Dr. Chestine Spore on Tuesday.  Wendi Maya, PA-C [facility] [phone]  Clinic MD:  Randie Heinz on call

## 2020-09-27 DIAGNOSIS — T8189XA Other complications of procedures, not elsewhere classified, initial encounter: Secondary | ICD-10-CM | POA: Diagnosis not present

## 2020-09-28 DIAGNOSIS — T8189XA Other complications of procedures, not elsewhere classified, initial encounter: Secondary | ICD-10-CM | POA: Diagnosis not present

## 2020-09-29 ENCOUNTER — Encounter (HOSPITAL_COMMUNITY)
Admission: RE | Admit: 2020-09-29 | Discharge: 2020-09-29 | Disposition: A | Discharge status: Home / Self Care | Payer: BC Managed Care – PPO | Source: Ambulatory Visit | Attending: Internal Medicine | Admitting: Internal Medicine

## 2020-09-29 ENCOUNTER — Other Ambulatory Visit: Payer: Self-pay | Admitting: Internal Medicine

## 2020-09-29 ENCOUNTER — Other Ambulatory Visit: Payer: Self-pay

## 2020-09-29 ENCOUNTER — Ambulatory Visit (HOSPITAL_COMMUNITY)
Admission: RE | Admit: 2020-09-29 | Discharge: 2020-09-29 | Disposition: A | Discharge status: Home / Self Care | Payer: BC Managed Care – PPO | Source: Ambulatory Visit | Attending: Internal Medicine | Admitting: Internal Medicine

## 2020-09-29 DIAGNOSIS — M8660 Other chronic osteomyelitis, unspecified site: Secondary | ICD-10-CM | POA: Insufficient documentation

## 2020-09-29 DIAGNOSIS — E11628 Type 2 diabetes mellitus with other skin complications: Secondary | ICD-10-CM

## 2020-09-29 DIAGNOSIS — L089 Local infection of the skin and subcutaneous tissue, unspecified: Secondary | ICD-10-CM | POA: Diagnosis not present

## 2020-09-29 DIAGNOSIS — T8189XA Other complications of procedures, not elsewhere classified, initial encounter: Secondary | ICD-10-CM | POA: Diagnosis not present

## 2020-09-29 DIAGNOSIS — Z452 Encounter for adjustment and management of vascular access device: Secondary | ICD-10-CM | POA: Diagnosis not present

## 2020-09-29 HISTORY — PX: IR FLUORO GUIDE CV LINE RIGHT: IMG2283

## 2020-09-29 MED ORDER — HEPARIN SOD (PORK) LOCK FLUSH 100 UNIT/ML IV SOLN
INTRAVENOUS | Status: AC
  Administered 2020-09-29: 250 [IU]
  Filled 2020-09-29: qty 5

## 2020-09-29 MED ORDER — SODIUM CHLORIDE 0.9 % IV SOLN
2.0000 g | Freq: Once | INTRAVENOUS | Status: AC
  Administered 2020-09-29: 2 g via INTRAVENOUS
  Filled 2020-09-29: qty 2

## 2020-09-29 MED ORDER — LIDOCAINE HCL 1 % IJ SOLN
INTRAMUSCULAR | Status: AC
  Filled 2020-09-29: qty 20

## 2020-09-29 MED ORDER — LIDOCAINE HCL 1 % IJ SOLN
INTRAMUSCULAR | Status: DC | PRN
  Administered 2020-09-29: 10 mL

## 2020-09-30 ENCOUNTER — Encounter (HOSPITAL_COMMUNITY): Payer: Self-pay

## 2020-09-30 ENCOUNTER — Other Ambulatory Visit: Payer: Self-pay

## 2020-09-30 ENCOUNTER — Other Ambulatory Visit: Payer: Self-pay | Admitting: Internal Medicine

## 2020-09-30 ENCOUNTER — Ambulatory Visit (INDEPENDENT_AMBULATORY_CARE_PROVIDER_SITE_OTHER): Payer: Self-pay | Admitting: Vascular Surgery

## 2020-09-30 ENCOUNTER — Ambulatory Visit: Payer: BC Managed Care – PPO | Admitting: Vascular Surgery

## 2020-09-30 VITALS — BP 152/86 | HR 78 | Temp 98.1°F | Resp 18 | Ht 72.0 in | Wt 230.0 lb

## 2020-09-30 DIAGNOSIS — M1611 Unilateral primary osteoarthritis, right hip: Secondary | ICD-10-CM | POA: Diagnosis not present

## 2020-09-30 DIAGNOSIS — I739 Peripheral vascular disease, unspecified: Secondary | ICD-10-CM

## 2020-09-30 DIAGNOSIS — E11628 Type 2 diabetes mellitus with other skin complications: Secondary | ICD-10-CM

## 2020-09-30 DIAGNOSIS — A414 Sepsis due to anaerobes: Secondary | ICD-10-CM | POA: Diagnosis not present

## 2020-09-30 DIAGNOSIS — M8660 Other chronic osteomyelitis, unspecified site: Secondary | ICD-10-CM

## 2020-09-30 DIAGNOSIS — T8189XA Other complications of procedures, not elsewhere classified, initial encounter: Secondary | ICD-10-CM | POA: Diagnosis not present

## 2020-09-30 NOTE — Progress Notes (Signed)
Patient name: Anthony Park MRN: [id number] DOB: Dec 05, 1960 Sex: male  REASON FOR VISIT: Follow-up and wound check of left TMA  HPI: Anthony Park is a 60 y.o. male with hx of DM, PVD, and HTN who presents for wound check of his left TMA. TMA was done on 08/20/2020 for CLI with tissue loss. Prior to this he underwent left lower extremity angiogram on 08/06/20 that showed a patent left popliteal Viabahn stent placed at [facility] and we did perform a left peroneal angioplasty. He had significant tibial and small vessel disease.  My partner Dr. Lenell Antu later took him to the OR for left TMA debridement on 09/11/2020.  He has been coming in the office ultimately getting VAC changes.  He has also had a PICC line placed for antibiotics and is being followed by ID.  Past Medical History:  Diagnosis Date  . Arthritis   . Diabetes mellitus without complication (HCC)    type 2 controlled with diet   . Diabetic toe ulcer (HCC) 11/05/2018  . History of kidney stones    passed 1 several years ago  . Hyperlipidemia   . Hypertension    not on medications  . Peripheral vascular disease (HCC)    stent in left leg due to anerysm     Past Surgical History:  Procedure Laterality Date  .  Left Transmetatarsal Ampuation (Left Foot)  08/20/2020  . ABDOMINAL AORTOGRAM W/LOWER EXTREMITY N/A 08/06/2020   Procedure: ABDOMINAL AORTOGRAM W/LOWER EXTREMITY;  Surgeon: [name], MD;  Location: [facility];  Service: Cardiovascular;  Laterality: N/A;  . AMPUTATION Right 11/07/2018   Procedure: RIGHT 3RD TOE AMPUTATION;  Surgeon: Nadara Mustard, MD;  Location: [facility] OR;  Service: Orthopedics;  Laterality: Right;  . AMPUTATION Left 08/20/2020   Procedure: Left Transmetatarsal Ampuation;  Surgeon: [name], MD;  Location: [facility] OR;  Service: Vascular;  Laterality: Left;  . AMPUTATION TOE Left    big toe and one next to it  . CATARACT EXTRACTION Bilateral   . PERIPHERAL VASCULAR BALLOON  ANGIOPLASTY Left 08/06/2020   Procedure: PERIPHERAL VASCULAR BALLOON ANGIOPLASTY;  Surgeon: [name], MD;  Location: [facility] CV LAB;  Service: Cardiovascular;  Laterality: Left;  Peroneal artery.  . TOE AMPUTATION Left 2015  . TOTAL HIP ARTHROPLASTY Left 05/23/2020   Procedure: LEFT TOTAL HIP ARTHROPLASTY ANTERIOR APPROACH;  Surgeon: Kathryne Hitch, MD;  Location: [facility] ORS;  Service: Orthopedics;  Laterality: Left;  . WOUND DEBRIDEMENT Left 09/11/2020   Procedure: DEBRIDEMENT OF LEFT TRANSMETATARSAL WOUND;  Surgeon: Leonie Douglas, MD;  Location: [facility] OR;  Service: Vascular;  Laterality: Left;    Family History  Problem Relation Age of Onset  . COPD Mother   . Cataracts Mother   . Brain cancer Father   . Hypertension Brother     SOCIAL HISTORY: Social History   Tobacco Use  . Smoking status: Former Smoker    Packs/day: 0.50    Years: 41.00    Pack years: 20.50    Types: Cigarettes    Quit date: 07/20/2020    Years since quitting: 0.1  . Smokeless tobacco: Never Used  Substance Use Topics  . Alcohol use: Yes    Alcohol/week: 7.0 standard drinks    Types: 7 Shots of liquor per week    Comment: 7 or more a week    No Known Allergies  Current Outpatient Medications  Medication Sig Dispense Refill  . acetaminophen (TYLENOL) 500 MG  tablet Take 1,000 mg by mouth every 6 (six) hours as needed for moderate pain.    Marland Kitchen aspirin EC 81 MG tablet Take 1 tablet (81 mg total) by mouth 2 (two) times daily after a meal. Swallow whole. (Patient taking differently: Take 81 mg by mouth daily. Swallow whole.) 30 tablet 0  . atorvastatin (LIPITOR) 10 MG tablet Take 1 tablet (10 mg total) by mouth daily. 30 tablet 11  . clopidogrel (PLAVIX) 75 MG tablet Take 1 tablet (75 mg total) by mouth daily. 30 tablet 11  . GARLIC PO Take 161 mg by mouth daily.     Marland Kitchen lisinopril (ZESTRIL) 20 MG tablet TAKE 1 TABLET BY MOUTH EVERY DAY (Patient taking differently: Take 20 mg by mouth  daily.) 90 tablet 1  . lisinopril (ZESTRIL) 20 MG tablet Take 1 tablet (20 mg total) by mouth daily. 30 tablet 0  . metFORMIN (GLUCOPHAGE) 500 MG tablet Take 1 tablet (500 mg total) by mouth 2 (two) times daily with a meal. 60 tablet 1  . ascorbic acid (VITAMIN C) 500 MG tablet Take 500 mg by mouth daily. (Patient not taking: Reported on 09/30/2020)    . cholecalciferol (VITAMIN D3) 25 MCG (1000 UNIT) tablet Take 1,000 Units by mouth daily. (Patient not taking: Reported on 09/30/2020)    . ciprofloxacin (CIPRO) 500 MG tablet Take 1 tablet (500 mg total) by mouth 2 (two) times daily. (Patient not taking: No sig reported) 20 tablet 0  . doxycycline (ADOXA) 100 MG tablet Take 1 tablet (100 mg total) by mouth 2 (two) times daily. (Patient not taking: Reported on 09/30/2020) 14 tablet 0  . meloxicam (MOBIC) 7.5 MG tablet Take 1 tablet (7.5 mg total) by mouth daily. (Patient not taking: Reported on 09/30/2020) 20 tablet 1  . oxyCODONE (OXY IR/ROXICODONE) 5 MG immediate release tablet Take 1 tablet (5 mg total) by mouth every 6 (six) hours as needed for severe pain. (Patient not taking: Reported on 09/30/2020) 20 tablet 0  . Turmeric 500 MG CAPS Take 500 mg by mouth daily. (Patient not taking: Reported on 09/30/2020)     No current facility-administered medications for this visit.    REVIEW OF SYSTEMS:  [X]  denotes positive finding, [ ]  denotes negative finding Cardiac  Comments:  Chest pain or chest pressure:    Shortness of breath upon exertion:    Short of breath when lying flat:    Irregular heart rhythm:        Vascular    Pain in calf, thigh, or hip brought on by ambulation:    Pain in feet at night that wakes you up from your sleep:     Blood clot in your veins:    Leg swelling:         Pulmonary    Oxygen at home:    Productive cough:     Wheezing:         Neurologic    Sudden weakness in arms or legs:     Sudden numbness in arms or legs:     Sudden onset of difficulty speaking or  slurred speech:    Temporary loss of vision in one eye:     Problems with dizziness:         Gastrointestinal    Blood in stool:     Vomited blood:         Genitourinary    Burning when urinating:     Blood in urine:  Psychiatric    Major depression:         Hematologic    Bleeding problems:    Problems with blood clotting too easily:        Skin    Rashes or ulcers:        Constitutional    Fever or chills:      PHYSICAL EXAM: Vitals:   09/30/20 1156  BP: (!) 152/86  Pulse: 78  Resp: 18  Temp: 98.1 F (36.7 C)  TempSrc: Temporal  SpO2: 99%  Weight: 230 lb (104.3 kg)  Height: 6' (1.829 m)    GENERAL: The patient is a well-nourished male, in no acute distress. The vital signs are documented above. CARDIAC: There is a regular rate and rhythm.  VASCULAR:  Left TMA necrotic as pictured below    DATA:   None  Assessment/Plan:  60 year old male presented with CLI with tissue loss and has undergone angiogram with tibial intervention and now TMA (his previous left popliteal stent placed at [facility] was widely patent on recent angiogram).  Ultimately his TMA is nonhealing as pictured above and he has had additional debridements in the OR with VAC placement.  The wound has ultimately failed to heal.  I subsequently have recommended a left below-knee amputation today.  Risk and benefits were discussed in detail including risk of non-healing.  [name], MD [facility] Office: [phone]  [name]

## 2020-10-01 ENCOUNTER — Other Ambulatory Visit: Payer: Self-pay

## 2020-10-01 ENCOUNTER — Telehealth: Payer: Self-pay | Admitting: *Deleted

## 2020-10-01 ENCOUNTER — Encounter (HOSPITAL_COMMUNITY): Payer: Self-pay | Admitting: Vascular Surgery

## 2020-10-01 DIAGNOSIS — T8189XA Other complications of procedures, not elsewhere classified, initial encounter: Secondary | ICD-10-CM | POA: Diagnosis not present

## 2020-10-01 NOTE — Progress Notes (Signed)
Spoke with partner Anthony Park cell [phone] for PAT information.  PCP - None Cardiologist - n/a  Chest x-ray - n/a EKG - 05/20/20 Stress Test - n/a ECHO - n/a Cardiac Cath - n/a  Fasting Blood Sugar - unknown Checks Blood Sugar 1 time in the afternoon  . Do not take oral diabetes medicines (metformin) the morning of surgery.  Blood Thinner Instructions:  Follow your surgeon's instructions on when to stop plavix prior to surgery.  Last day will be on 10/02/20 per Anthony Park.  Aspirin Instructions: Follow your surgeon's instructions on when to stop aspirin prior to surgery,  If no instructions were given by your surgeon then you will need to call the office for those instructions. Last dose will be on 10/02/20 per Anthony Park.  Anesthesia review: Yes  STOP now taking any Aspirin (unless otherwise instructed by your surgeon), Aleve, Naproxen, Ibuprofen, Motrin, Advil, Goody's, BC's, all herbal medications, fish oil, and all vitamins.   Coronavirus Screening Covid test is scheduled on 10/02/20 Do you have any of the following symptoms:  Cough yes/no: No Fever (>100.51F)  yes/no: No Runny nose yes/no: No Sore throat yes/no: No Difficulty breathing/shortness of breath  yes/no: No  Have you traveled in the last 14 days and where? yes/no: No  Partner Anthony Park verbalized understanding of instructions that were given via phone.

## 2020-10-01 NOTE — Procedures (Signed)
Successful placement of single lumen PICC line to right basilic vein. Length 34cm Tip at lower SVC/RA PICC capped No complications Ready for use.  EBL < 5 mL   Hoyt Koch PA-C 10/01/2020 10:58 AM

## 2020-10-01 NOTE — Telephone Encounter (Signed)
Patient called to let Dr Renold Don know that he will be stopping IV antibiotic treatment as he is having surgery 1/21 to amputate his foot. Please advise when/if he should stop antibiotics and what to do about the PICC line. Andree Coss, RN

## 2020-10-02 ENCOUNTER — Other Ambulatory Visit (HOSPITAL_COMMUNITY)
Admission: RE | Admit: 2020-10-02 | Discharge: 2020-10-02 | Disposition: A | Discharge status: Home / Self Care | Payer: BC Managed Care – PPO | Source: Ambulatory Visit | Attending: Vascular Surgery | Admitting: Vascular Surgery

## 2020-10-02 DIAGNOSIS — U071 COVID-19: Secondary | ICD-10-CM | POA: Diagnosis not present

## 2020-10-02 DIAGNOSIS — E782 Mixed hyperlipidemia: Secondary | ICD-10-CM | POA: Diagnosis not present

## 2020-10-02 DIAGNOSIS — G8918 Other acute postprocedural pain: Secondary | ICD-10-CM | POA: Diagnosis not present

## 2020-10-02 DIAGNOSIS — Z89512 Acquired absence of left leg below knee: Secondary | ICD-10-CM | POA: Diagnosis not present

## 2020-10-02 DIAGNOSIS — Z79899 Other long term (current) drug therapy: Secondary | ICD-10-CM | POA: Diagnosis not present

## 2020-10-02 DIAGNOSIS — Z87891 Personal history of nicotine dependence: Secondary | ICD-10-CM | POA: Diagnosis not present

## 2020-10-02 DIAGNOSIS — D7589 Other specified diseases of blood and blood-forming organs: Secondary | ICD-10-CM | POA: Diagnosis not present

## 2020-10-02 DIAGNOSIS — Z791 Long term (current) use of non-steroidal anti-inflammatories (NSAID): Secondary | ICD-10-CM | POA: Diagnosis not present

## 2020-10-02 DIAGNOSIS — M1990 Unspecified osteoarthritis, unspecified site: Secondary | ICD-10-CM | POA: Diagnosis not present

## 2020-10-02 DIAGNOSIS — E1151 Type 2 diabetes mellitus with diabetic peripheral angiopathy without gangrene: Secondary | ICD-10-CM | POA: Diagnosis not present

## 2020-10-02 DIAGNOSIS — D649 Anemia, unspecified: Secondary | ICD-10-CM | POA: Diagnosis not present

## 2020-10-02 DIAGNOSIS — E1169 Type 2 diabetes mellitus with other specified complication: Secondary | ICD-10-CM | POA: Diagnosis not present

## 2020-10-02 DIAGNOSIS — Z7984 Long term (current) use of oral hypoglycemic drugs: Secondary | ICD-10-CM | POA: Diagnosis not present

## 2020-10-02 DIAGNOSIS — Z95820 Peripheral vascular angioplasty status with implants and grafts: Secondary | ICD-10-CM | POA: Diagnosis not present

## 2020-10-02 DIAGNOSIS — Z7982 Long term (current) use of aspirin: Secondary | ICD-10-CM | POA: Diagnosis not present

## 2020-10-02 DIAGNOSIS — L97929 Non-pressure chronic ulcer of unspecified part of left lower leg with unspecified severity: Secondary | ICD-10-CM | POA: Diagnosis not present

## 2020-10-02 DIAGNOSIS — I739 Peripheral vascular disease, unspecified: Secondary | ICD-10-CM | POA: Diagnosis not present

## 2020-10-02 DIAGNOSIS — Z8249 Family history of ischemic heart disease and other diseases of the circulatory system: Secondary | ICD-10-CM | POA: Diagnosis not present

## 2020-10-02 DIAGNOSIS — I70248 Atherosclerosis of native arteries of left leg with ulceration of other part of lower left leg: Secondary | ICD-10-CM | POA: Diagnosis not present

## 2020-10-02 DIAGNOSIS — J9811 Atelectasis: Secondary | ICD-10-CM | POA: Diagnosis not present

## 2020-10-02 DIAGNOSIS — Z20822 Contact with and (suspected) exposure to covid-19: Secondary | ICD-10-CM | POA: Insufficient documentation

## 2020-10-02 DIAGNOSIS — Z89422 Acquired absence of other left toe(s): Secondary | ICD-10-CM | POA: Diagnosis not present

## 2020-10-02 DIAGNOSIS — Z7902 Long term (current) use of antithrombotics/antiplatelets: Secondary | ICD-10-CM | POA: Diagnosis not present

## 2020-10-02 DIAGNOSIS — Z01812 Encounter for preprocedural laboratory examination: Secondary | ICD-10-CM | POA: Insufficient documentation

## 2020-10-02 DIAGNOSIS — Z6831 Body mass index (BMI) 31.0-31.9, adult: Secondary | ICD-10-CM | POA: Diagnosis not present

## 2020-10-02 DIAGNOSIS — Z87442 Personal history of urinary calculi: Secondary | ICD-10-CM | POA: Diagnosis not present

## 2020-10-02 DIAGNOSIS — I10 Essential (primary) hypertension: Secondary | ICD-10-CM | POA: Diagnosis not present

## 2020-10-02 DIAGNOSIS — T8781 Dehiscence of amputation stump: Secondary | ICD-10-CM | POA: Diagnosis not present

## 2020-10-02 DIAGNOSIS — E538 Deficiency of other specified B group vitamins: Secondary | ICD-10-CM | POA: Diagnosis not present

## 2020-10-02 DIAGNOSIS — M869 Osteomyelitis, unspecified: Secondary | ICD-10-CM | POA: Diagnosis not present

## 2020-10-02 DIAGNOSIS — T8789 Other complications of amputation stump: Secondary | ICD-10-CM | POA: Diagnosis not present

## 2020-10-02 LAB — SARS CORONAVIRUS 2 (TAT 6-24 HRS): SARS Coronavirus 2: POSITIVE — AB

## 2020-10-02 NOTE — Telephone Encounter (Signed)
Relayed to patient. He asked to cancel upcoming follow up appointment, will call if he needs follow up. Notified [facility]. Andree Coss, RN

## 2020-10-02 NOTE — Anesthesia Preprocedure Evaluation (Addendum)
Anesthesia Evaluation  Patient identified by MRN, date of birth, ID band Patient awake    Reviewed: Allergy & Precautions, NPO status , Patient's Chart, lab work & pertinent test results  Airway Mallampati: II  TM Distance: >3 FB Neck ROM: Full    Dental  (+) Teeth Intact   Pulmonary neg pulmonary ROS, former smoker,    Pulmonary exam normal        Cardiovascular hypertension, Pt. on medications + Peripheral Vascular Disease (on plavix)   Rhythm:Regular Rate:Normal     Neuro/Psych negative neurological ROS  negative psych ROS   GI/Hepatic negative GI ROS, Neg liver ROS,   Endo/Other  diabetes, Poorly Controlled, Type 2, Oral Hypoglycemic Agents  Renal/GU   negative genitourinary   Musculoskeletal  (+) Arthritis ,   Abdominal (+)  Abdomen: soft. Bowel sounds: normal.  Peds  Hematology negative hematology ROS (+)   Anesthesia Other Findings   Reproductive/Obstetrics                            Anesthesia Physical Anesthesia Plan  ASA: III  Anesthesia Plan: Regional and General   Post-op Pain Management:  Regional for Post-op pain   Induction: Intravenous  PONV Risk Score and Plan: 2 and Ondansetron, Propofol infusion, Treatment may vary due to age or medical condition and Midazolam  Airway Management Planned: LMA and Mask  Additional Equipment: None  Intra-op Plan:   Post-operative Plan: Extubation in OR  Informed Consent: I have reviewed the patients History and Physical, chart, labs and discussed the procedure including the risks, benefits and alternatives for the proposed anesthesia with the patient or authorized representative who has indicated his/her understanding and acceptance.     Dental advisory given  Plan Discussed with: CRNA  Anesthesia Plan Comments: (Lab Results      Component                Value               Date                      WBC                       11.2 (H)            09/23/2020                HGB                      10.0 (L)            09/23/2020                HCT                      29.5 (L)            09/23/2020                MCV                      85.5                09/23/2020                PLT  298                 09/23/2020           Lab Results      Component                Value               Date                      NA                       143                 09/23/2020                K                        4.3                 09/23/2020                CO2                      26                  09/23/2020                GLUCOSE                  121 (H)             09/23/2020                BUN                      11                  09/23/2020                CREATININE               0.75                09/23/2020                CALCIUM                  9.5                 09/23/2020                GFRNONAA                 >60                 09/11/2020                GFRAA                    >60                 05/20/2020          )       Anesthesia Quick Evaluation

## 2020-10-03 ENCOUNTER — Other Ambulatory Visit: Payer: Self-pay

## 2020-10-03 ENCOUNTER — Inpatient Hospital Stay (HOSPITAL_COMMUNITY): Payer: BC Managed Care – PPO | Admitting: Physician Assistant

## 2020-10-03 ENCOUNTER — Encounter (HOSPITAL_COMMUNITY)
Admission: RE | Disposition: A | Discharge status: Home Health | Payer: Self-pay | Source: Home / Self Care | Attending: Internal Medicine

## 2020-10-03 ENCOUNTER — Inpatient Hospital Stay (HOSPITAL_COMMUNITY)
Admission: RE | Admit: 2020-10-03 | Discharge: 2020-10-06 | DRG: 474 | Disposition: A | Discharge status: Home Health | Payer: BC Managed Care – PPO | Attending: Internal Medicine | Admitting: Internal Medicine

## 2020-10-03 ENCOUNTER — Telehealth (HOSPITAL_COMMUNITY): Payer: Self-pay

## 2020-10-03 ENCOUNTER — Inpatient Hospital Stay (HOSPITAL_COMMUNITY): Payer: BC Managed Care – PPO

## 2020-10-03 ENCOUNTER — Encounter (HOSPITAL_COMMUNITY): Payer: Self-pay | Admitting: Vascular Surgery

## 2020-10-03 DIAGNOSIS — I10 Essential (primary) hypertension: Secondary | ICD-10-CM | POA: Diagnosis present

## 2020-10-03 DIAGNOSIS — Z89512 Acquired absence of left leg below knee: Secondary | ICD-10-CM

## 2020-10-03 DIAGNOSIS — E538 Deficiency of other specified B group vitamins: Secondary | ICD-10-CM | POA: Diagnosis present

## 2020-10-03 DIAGNOSIS — Z8249 Family history of ischemic heart disease and other diseases of the circulatory system: Secondary | ICD-10-CM

## 2020-10-03 DIAGNOSIS — Z87442 Personal history of urinary calculi: Secondary | ICD-10-CM

## 2020-10-03 DIAGNOSIS — U071 COVID-19: Secondary | ICD-10-CM

## 2020-10-03 DIAGNOSIS — S81809A Unspecified open wound, unspecified lower leg, initial encounter: Secondary | ICD-10-CM | POA: Diagnosis present

## 2020-10-03 DIAGNOSIS — Z791 Long term (current) use of non-steroidal anti-inflammatories (NSAID): Secondary | ICD-10-CM | POA: Diagnosis not present

## 2020-10-03 DIAGNOSIS — M1990 Unspecified osteoarthritis, unspecified site: Secondary | ICD-10-CM | POA: Diagnosis present

## 2020-10-03 DIAGNOSIS — I739 Peripheral vascular disease, unspecified: Secondary | ICD-10-CM | POA: Diagnosis not present

## 2020-10-03 DIAGNOSIS — D649 Anemia, unspecified: Secondary | ICD-10-CM | POA: Diagnosis present

## 2020-10-03 DIAGNOSIS — T8781 Dehiscence of amputation stump: Secondary | ICD-10-CM

## 2020-10-03 DIAGNOSIS — T8789 Other complications of amputation stump: Principal | ICD-10-CM | POA: Diagnosis present

## 2020-10-03 DIAGNOSIS — Z89422 Acquired absence of other left toe(s): Secondary | ICD-10-CM

## 2020-10-03 DIAGNOSIS — M869 Osteomyelitis, unspecified: Secondary | ICD-10-CM | POA: Diagnosis present

## 2020-10-03 DIAGNOSIS — Z7982 Long term (current) use of aspirin: Secondary | ICD-10-CM

## 2020-10-03 DIAGNOSIS — Z6831 Body mass index (BMI) 31.0-31.9, adult: Secondary | ICD-10-CM

## 2020-10-03 DIAGNOSIS — Z79899 Other long term (current) drug therapy: Secondary | ICD-10-CM

## 2020-10-03 DIAGNOSIS — E1151 Type 2 diabetes mellitus with diabetic peripheral angiopathy without gangrene: Secondary | ICD-10-CM | POA: Diagnosis present

## 2020-10-03 DIAGNOSIS — E782 Mixed hyperlipidemia: Secondary | ICD-10-CM | POA: Diagnosis present

## 2020-10-03 DIAGNOSIS — Z7902 Long term (current) use of antithrombotics/antiplatelets: Secondary | ICD-10-CM | POA: Diagnosis not present

## 2020-10-03 DIAGNOSIS — Z95820 Peripheral vascular angioplasty status with implants and grafts: Secondary | ICD-10-CM

## 2020-10-03 DIAGNOSIS — Z89432 Acquired absence of left foot: Secondary | ICD-10-CM | POA: Diagnosis present

## 2020-10-03 DIAGNOSIS — E11621 Type 2 diabetes mellitus with foot ulcer: Secondary | ICD-10-CM | POA: Diagnosis present

## 2020-10-03 DIAGNOSIS — Z87891 Personal history of nicotine dependence: Secondary | ICD-10-CM

## 2020-10-03 DIAGNOSIS — Z7984 Long term (current) use of oral hypoglycemic drugs: Secondary | ICD-10-CM | POA: Diagnosis not present

## 2020-10-03 DIAGNOSIS — E1169 Type 2 diabetes mellitus with other specified complication: Secondary | ICD-10-CM | POA: Diagnosis present

## 2020-10-03 DIAGNOSIS — L97509 Non-pressure chronic ulcer of other part of unspecified foot with unspecified severity: Secondary | ICD-10-CM | POA: Diagnosis present

## 2020-10-03 HISTORY — PX: AMPUTATION: SHX166

## 2020-10-03 LAB — COMPREHENSIVE METABOLIC PANEL
ALT: 15 U/L (ref 0–44)
AST: 26 U/L (ref 15–41)
Albumin: 3.4 g/dL — ABNORMAL LOW (ref 3.5–5.0)
Alkaline Phosphatase: 46 U/L (ref 38–126)
Anion gap: 12 (ref 5–15)
BUN: 8 mg/dL (ref 6–20)
CO2: 20 mmol/L — ABNORMAL LOW (ref 22–32)
Calcium: 9.1 mg/dL (ref 8.9–10.3)
Chloride: 108 mmol/L (ref 98–111)
Creatinine, Ser: 0.58 mg/dL — ABNORMAL LOW (ref 0.61–1.24)
GFR, Estimated: >60 mL/min (ref >60)
Glucose, Bld: 127 mg/dL — ABNORMAL HIGH (ref 70–99)
Potassium: 4.4 mmol/L (ref 3.5–5.1)
Sodium: 140 mmol/L (ref 135–145)
Total Bilirubin: 1 mg/dL (ref 0.3–1.2)
Total Protein: 6.6 g/dL (ref 6.5–8.1)

## 2020-10-03 LAB — URINALYSIS, ROUTINE W REFLEX MICROSCOPIC
Bilirubin Urine: NEGATIVE
Glucose, UA: NEGATIVE mg/dL
Ketones, ur: NEGATIVE mg/dL
Leukocytes,Ua: NEGATIVE
Nitrite: NEGATIVE
Protein, ur: 30 mg/dL — AB
Specific Gravity, Urine: 1.016 (ref 1.005–1.030)
pH: 5 (ref 5.0–8.0)

## 2020-10-03 LAB — CBC
HCT: 30.3 % — ABNORMAL LOW (ref 39.0–52.0)
Hemoglobin: 9.3 g/dL — ABNORMAL LOW (ref 13.0–17.0)
MCH: 28.2 pg (ref 26.0–34.0)
MCHC: 30.7 g/dL (ref 30.0–36.0)
MCV: 91.8 fL (ref 80.0–100.0)
Platelets: 290 10*3/uL (ref 150–400)
RBC: 3.3 MIL/uL — ABNORMAL LOW (ref 4.22–5.81)
RDW: 16.3 % — ABNORMAL HIGH (ref 11.5–15.5)
WBC: 9.9 10*3/uL (ref 4.0–10.5)
nRBC: 0 % (ref 0.0–0.2)

## 2020-10-03 LAB — GLUCOSE, CAPILLARY
Glucose-Capillary: 129 mg/dL — ABNORMAL HIGH (ref 70–99)
Glucose-Capillary: 133 mg/dL — ABNORMAL HIGH (ref 70–99)
Glucose-Capillary: 182 mg/dL — ABNORMAL HIGH (ref 70–99)
Glucose-Capillary: 219 mg/dL — ABNORMAL HIGH (ref 70–99)

## 2020-10-03 LAB — PROCALCITONIN: Procalcitonin: <0.1 ng/mL

## 2020-10-03 LAB — HEMOGLOBIN A1C
Hgb A1c MFr Bld: 6.2 % — ABNORMAL HIGH (ref 4.8–5.6)
Mean Plasma Glucose: 131.24 mg/dL

## 2020-10-03 LAB — D-DIMER, QUANTITATIVE: D-Dimer, Quant: 2.18 ug/mL-FEU — ABNORMAL HIGH (ref 0.00–0.50)

## 2020-10-03 LAB — SURGICAL PCR SCREEN
MRSA, PCR: NEGATIVE
Staphylococcus aureus: NEGATIVE

## 2020-10-03 LAB — APTT: aPTT: 26 seconds (ref 24–36)

## 2020-10-03 LAB — C-REACTIVE PROTEIN: CRP: 3.8 mg/dL — ABNORMAL HIGH (ref <1.0)

## 2020-10-03 LAB — PROTIME-INR
INR: 1.1 (ref 0.8–1.2)
Prothrombin Time: 13.6 seconds (ref 11.4–15.2)

## 2020-10-03 LAB — LACTATE DEHYDROGENASE: LDH: 86 U/L — ABNORMAL LOW (ref 98–192)

## 2020-10-03 LAB — HIV ANTIBODY (ROUTINE TESTING W REFLEX): HIV Screen 4th Generation wRfx: NONREACTIVE

## 2020-10-03 SURGERY — AMPUTATION BELOW KNEE
Anesthesia: Regional | Site: Knee | Laterality: Left

## 2020-10-03 MED ORDER — CHLORHEXIDINE GLUCONATE 0.12 % MT SOLN
OROMUCOSAL | Status: AC
  Administered 2020-10-03: 15 mL via OROMUCOSAL
  Filled 2020-10-03: qty 15

## 2020-10-03 MED ORDER — MORPHINE SULFATE (PF) 2 MG/ML IV SOLN
2.0000 mg | INTRAVENOUS | Status: DC | PRN
  Administered 2020-10-03 – 2020-10-06 (×14): 2 mg via INTRAVENOUS
  Filled 2020-10-03 (×14): qty 1

## 2020-10-03 MED ORDER — GLYCOPYRROLATE 0.2 MG/ML IJ SOLN
INTRAMUSCULAR | Status: DC | PRN
  Administered 2020-10-03: .2 mg via INTRAVENOUS

## 2020-10-03 MED ORDER — LACTATED RINGERS IV SOLN: INTRAVENOUS | Status: DC

## 2020-10-03 MED ORDER — DEXAMETHASONE SODIUM PHOSPHATE 10 MG/ML IJ SOLN
INTRAMUSCULAR | Status: DC | PRN
  Administered 2020-10-03: 10 mg via INTRAVENOUS

## 2020-10-03 MED ORDER — BACITRACIN ZINC 500 UNIT/GM EX OINT
TOPICAL_OINTMENT | CUTANEOUS | Status: AC
  Filled 2020-10-03: qty 28.35

## 2020-10-03 MED ORDER — ONDANSETRON HCL 4 MG/2ML IJ SOLN: 4.0000 mg | Freq: Four times a day (QID) | INTRAMUSCULAR | Status: DC | PRN

## 2020-10-03 MED ORDER — NICOTINE 14 MG/24HR TD PT24
14.0000 mg | MEDICATED_PATCH | Freq: Every day | TRANSDERMAL | Status: DC | PRN
Start: 2020-10-03 — End: 2020-10-06

## 2020-10-03 MED ORDER — ASPIRIN EC 81 MG PO TBEC
81.0000 mg | DELAYED_RELEASE_TABLET | Freq: Every day | ORAL | Status: DC
  Administered 2020-10-04 – 2020-10-06 (×3): 81 mg via ORAL
  Filled 2020-10-03 (×3): qty 1

## 2020-10-03 MED ORDER — HEPARIN SODIUM (PORCINE) 5000 UNIT/ML IJ SOLN
5000.0000 [IU] | Freq: Three times a day (TID) | INTRAMUSCULAR | Status: DC
  Administered 2020-10-04 – 2020-10-06 (×8): 5000 [IU] via SUBCUTANEOUS
  Filled 2020-10-03 (×8): qty 1

## 2020-10-03 MED ORDER — CHLORHEXIDINE GLUCONATE CLOTH 2 % EX PADS: 6.0000 | MEDICATED_PAD | Freq: Once | CUTANEOUS | Status: DC

## 2020-10-03 MED ORDER — SUFENTANIL CITRATE 50 MCG/ML IV SOLN
INTRAVENOUS | Status: AC
  Filled 2020-10-03: qty 1

## 2020-10-03 MED ORDER — DOCUSATE SODIUM 100 MG PO CAPS
100.0000 mg | ORAL_CAPSULE | Freq: Every day | ORAL | Status: DC
  Administered 2020-10-04 – 2020-10-06 (×3): 100 mg via ORAL
  Filled 2020-10-03 (×3): qty 1

## 2020-10-03 MED ORDER — PHENOL 1.4 % MT LIQD
1.0000 | OROMUCOSAL | Status: DC | PRN
  Administered 2020-10-03: 1 via OROMUCOSAL
  Filled 2020-10-03: qty 177

## 2020-10-03 MED ORDER — SODIUM CHLORIDE 0.9 % IV SOLN: INTRAVENOUS | Status: DC

## 2020-10-03 MED ORDER — CEFAZOLIN SODIUM-DEXTROSE 2-4 GM/100ML-% IV SOLN
2.0000 g | INTRAVENOUS | Status: AC
  Administered 2020-10-03: 2 g via INTRAVENOUS
  Filled 2020-10-03: qty 100

## 2020-10-03 MED ORDER — ROPIVACAINE HCL 5 MG/ML IJ SOLN
INTRAMUSCULAR | Status: DC | PRN
  Administered 2020-10-03: 30 mL via PERINEURAL

## 2020-10-03 MED ORDER — PROPOFOL 10 MG/ML IV BOLUS
INTRAVENOUS | Status: DC | PRN
  Administered 2020-10-03: 50 mg via INTRAVENOUS
  Administered 2020-10-03: 200 mg via INTRAVENOUS

## 2020-10-03 MED ORDER — POLYETHYLENE GLYCOL 3350 17 G PO PACK
17.0000 g | PACK | Freq: Every day | ORAL | Status: DC | PRN
  Administered 2020-10-05: 17 g via ORAL
  Filled 2020-10-03: qty 1

## 2020-10-03 MED ORDER — INSULIN ASPART 100 UNIT/ML ~~LOC~~ SOLN
0.0000 [IU] | Freq: Three times a day (TID) | SUBCUTANEOUS | Status: DC
  Administered 2020-10-03 – 2020-10-04 (×3): 1 [IU] via SUBCUTANEOUS
  Administered 2020-10-06: 2 [IU] via SUBCUTANEOUS

## 2020-10-03 MED ORDER — ACETAMINOPHEN 650 MG RE SUPP: 325.0000 mg | RECTAL | Status: DC | PRN

## 2020-10-03 MED ORDER — ACETAMINOPHEN 10 MG/ML IV SOLN: 1000.0000 mg | Freq: Once | INTRAVENOUS | Status: DC | PRN

## 2020-10-03 MED ORDER — GUAIFENESIN-DM 100-10 MG/5ML PO SYRP: 15.0000 mL | ORAL_SOLUTION | ORAL | Status: DC | PRN

## 2020-10-03 MED ORDER — ACETAMINOPHEN 325 MG PO TABS: 325.0000 mg | ORAL_TABLET | ORAL | Status: DC | PRN

## 2020-10-03 MED ORDER — BISACODYL 10 MG RE SUPP
10.0000 mg | Freq: Every day | RECTAL | Status: DC | PRN
Start: 2020-10-03 — End: 2020-10-06

## 2020-10-03 MED ORDER — DOUBLE ANTIBIOTIC 500-10000 UNIT/GM EX OINT
TOPICAL_OINTMENT | CUTANEOUS | Status: AC
  Filled 2020-10-03: qty 28.4

## 2020-10-03 MED ORDER — ONDANSETRON HCL 4 MG/2ML IJ SOLN: 4.0000 mg | Freq: Once | INTRAMUSCULAR | Status: DC | PRN

## 2020-10-03 MED ORDER — CHLORHEXIDINE GLUCONATE 0.12 % MT SOLN: 15.0000 mL | Freq: Once | OROMUCOSAL | Status: AC

## 2020-10-03 MED ORDER — FENTANYL CITRATE (PF) 100 MCG/2ML IJ SOLN
100.0000 ug | Freq: Once | INTRAMUSCULAR | Status: AC
Start: 2020-10-03 — End: 2020-10-03
  Administered 2020-10-03: 100 ug via INTRAVENOUS
  Filled 2020-10-03: qty 2

## 2020-10-03 MED ORDER — BACITRACIN ZINC 500 UNIT/GM EX OINT
TOPICAL_OINTMENT | CUTANEOUS | Status: DC | PRN
  Administered 2020-10-03: 1 via TOPICAL

## 2020-10-03 MED ORDER — CLOPIDOGREL BISULFATE 75 MG PO TABS
75.0000 mg | ORAL_TABLET | Freq: Every day | ORAL | Status: DC
  Administered 2020-10-04 – 2020-10-06 (×3): 75 mg via ORAL
  Filled 2020-10-03 (×3): qty 1

## 2020-10-03 MED ORDER — ATORVASTATIN CALCIUM 10 MG PO TABS
10.0000 mg | ORAL_TABLET | Freq: Every day | ORAL | Status: DC
  Administered 2020-10-04 – 2020-10-06 (×3): 10 mg via ORAL
  Filled 2020-10-03 (×3): qty 1

## 2020-10-03 MED ORDER — MIDAZOLAM HCL 2 MG/2ML IJ SOLN
INTRAMUSCULAR | Status: AC
  Administered 2020-10-03: 2 mg
  Filled 2020-10-03: qty 2

## 2020-10-03 MED ORDER — SUFENTANIL CITRATE 50 MCG/ML IV SOLN
INTRAVENOUS | Status: DC | PRN
  Administered 2020-10-03 (×5): 10 ug via INTRAVENOUS

## 2020-10-03 MED ORDER — MIDAZOLAM HCL 2 MG/2ML IJ SOLN
INTRAMUSCULAR | Status: AC
  Filled 2020-10-03: qty 2

## 2020-10-03 MED ORDER — LACTATED RINGERS IV SOLN: INTRAVENOUS | Status: DC | PRN

## 2020-10-03 MED ORDER — ORAL CARE MOUTH RINSE: 15.0000 mL | Freq: Once | OROMUCOSAL | Status: AC

## 2020-10-03 MED ORDER — CEFAZOLIN SODIUM-DEXTROSE 2-4 GM/100ML-% IV SOLN
2.0000 g | Freq: Three times a day (TID) | INTRAVENOUS | Status: AC
Start: 2020-10-03 — End: 2020-10-04
  Administered 2020-10-03 – 2020-10-04 (×2): 2 g via INTRAVENOUS
  Filled 2020-10-03 (×2): qty 100

## 2020-10-03 MED ORDER — OXYCODONE-ACETAMINOPHEN 5-325 MG PO TABS
1.0000 | ORAL_TABLET | ORAL | Status: DC | PRN
  Administered 2020-10-03 – 2020-10-06 (×13): 2 via ORAL
  Filled 2020-10-03 (×13): qty 2

## 2020-10-03 MED ORDER — FENTANYL CITRATE (PF) 100 MCG/2ML IJ SOLN
INTRAMUSCULAR | Status: AC
  Filled 2020-10-03: qty 2

## 2020-10-03 MED ORDER — MAGNESIUM CITRATE PO SOLN: 1.0000 | Freq: Once | ORAL | Status: DC | PRN

## 2020-10-03 MED ORDER — MIDAZOLAM HCL 2 MG/2ML IJ SOLN
INTRAMUSCULAR | Status: DC | PRN
  Administered 2020-10-03: 2 mg via INTRAVENOUS

## 2020-10-03 MED ORDER — LIDOCAINE HCL (CARDIAC) PF 100 MG/5ML IV SOSY
PREFILLED_SYRINGE | INTRAVENOUS | Status: DC | PRN
  Administered 2020-10-03: 100 mg via INTRAVENOUS

## 2020-10-03 MED ORDER — LISINOPRIL 10 MG PO TABS
20.0000 mg | ORAL_TABLET | Freq: Every day | ORAL | Status: DC
  Administered 2020-10-04 – 2020-10-05 (×2): 20 mg via ORAL
  Filled 2020-10-03 (×2): qty 2

## 2020-10-03 MED ORDER — PROPOFOL 10 MG/ML IV BOLUS
INTRAVENOUS | Status: AC
  Filled 2020-10-03: qty 20

## 2020-10-03 MED ORDER — HYDROMORPHONE HCL 1 MG/ML IJ SOLN: 0.2500 mg | INTRAMUSCULAR | Status: DC | PRN

## 2020-10-03 MED ORDER — 0.9 % SODIUM CHLORIDE (POUR BTL) OPTIME
TOPICAL | Status: DC | PRN
  Administered 2020-10-03: 1000 mL

## 2020-10-03 MED ORDER — ONDANSETRON HCL 4 MG/2ML IJ SOLN
INTRAMUSCULAR | Status: DC | PRN
  Administered 2020-10-03: 4 mg via INTRAVENOUS

## 2020-10-03 SURGICAL SUPPLY — 50 items
BANDAGE ESMARK 6X9 LF (GAUZE/BANDAGES/DRESSINGS) IMPLANT
BLADE SAW SAG 73X25 THK (BLADE) ×1
BLADE SAW SGTL 73X25 THK (BLADE) ×1 IMPLANT
BNDG COHESIVE 6X5 TAN STRL LF (GAUZE/BANDAGES/DRESSINGS) ×2 IMPLANT
BNDG ELASTIC 4X5.8 VLCR STR LF (GAUZE/BANDAGES/DRESSINGS) ×2 IMPLANT
BNDG ELASTIC 6X5.8 VLCR STR LF (GAUZE/BANDAGES/DRESSINGS) ×2 IMPLANT
BNDG ESMARK 6X9 LF (GAUZE/BANDAGES/DRESSINGS)
BNDG GAUZE ELAST 4 BULKY (GAUZE/BANDAGES/DRESSINGS) ×2 IMPLANT
CANISTER SUCT 3000ML PPV (MISCELLANEOUS) ×2 IMPLANT
CLIP VESOCCLUDE MED 6/CT (CLIP) ×2 IMPLANT
COVER BACK TABLE 60X90IN (DRAPES) IMPLANT
COVER SURGICAL LIGHT HANDLE (MISCELLANEOUS) ×2 IMPLANT
COVER WAND RF STERILE (DRAPES) ×2 IMPLANT
DRAIN CHANNEL 19F RND (DRAIN) IMPLANT
DRAPE HALF SHEET 40X57 (DRAPES) ×2 IMPLANT
DRAPE ORTHO SPLIT 77X108 STRL (DRAPES) ×4
DRAPE SURG ORHT 6 SPLT 77X108 (DRAPES) ×4 IMPLANT
DRSG ADAPTIC 3X8 NADH LF (GAUZE/BANDAGES/DRESSINGS) ×2 IMPLANT
ELECT BLADE 4.0 EZ CLEAN MEGAD (MISCELLANEOUS) ×2
ELECT REM PT RETURN 9FT ADLT (ELECTROSURGICAL) ×2
ELECTRODE BLDE 4.0 EZ CLN MEGD (MISCELLANEOUS) ×1 IMPLANT
ELECTRODE REM PT RTRN 9FT ADLT (ELECTROSURGICAL) ×1 IMPLANT
EVACUATOR SILICONE 100CC (DRAIN) IMPLANT
GAUZE SPONGE 4X4 12PLY STRL (GAUZE/BANDAGES/DRESSINGS) ×2 IMPLANT
GLOVE BIO SURGEON STRL SZ7.5 (GLOVE) ×2 IMPLANT
GLOVE SRG 8 PF TXTR STRL LF DI (GLOVE) ×1 IMPLANT
GLOVE SURG UNDER POLY LF SZ8 (GLOVE) ×1
GOWN STRL REUS W/ TWL LRG LVL3 (GOWN DISPOSABLE) ×2 IMPLANT
GOWN STRL REUS W/ TWL XL LVL3 (GOWN DISPOSABLE) ×2 IMPLANT
GOWN STRL REUS W/TWL LRG LVL3 (GOWN DISPOSABLE) ×2
GOWN STRL REUS W/TWL XL LVL3 (GOWN DISPOSABLE) ×2
KIT BASIN OR (CUSTOM PROCEDURE TRAY) ×2 IMPLANT
KIT TURNOVER KIT B (KITS) ×2 IMPLANT
NS IRRIG 1000ML POUR BTL (IV SOLUTION) ×2 IMPLANT
PACK GENERAL/GYN (CUSTOM PROCEDURE TRAY) ×2 IMPLANT
PAD ARMBOARD 7.5X6 YLW CONV (MISCELLANEOUS) ×4 IMPLANT
PENCIL SMOKE EVACUATOR TELESCP (INSTRUMENTS) ×2 IMPLANT
STAPLER VISISTAT 35W (STAPLE) ×2 IMPLANT
STOCKINETTE IMPERVIOUS LG (DRAPES) ×2 IMPLANT
SUT ETHILON 3 0 PS 1 (SUTURE) IMPLANT
SUT SILK 2 0 (SUTURE) ×1
SUT SILK 2 0 SH CR/8 (SUTURE) ×2 IMPLANT
SUT SILK 2-0 18XBRD TIE 12 (SUTURE) ×1 IMPLANT
SUT SILK 3 0 (SUTURE) ×1
SUT SILK 3-0 18XBRD TIE 12 (SUTURE) ×1 IMPLANT
SUT VIC AB 2-0 CT1 18 (SUTURE) ×4 IMPLANT
SUT VIC AB 3-0 SH 18 (SUTURE) ×2 IMPLANT
TOWEL GREEN STERILE (TOWEL DISPOSABLE) ×4 IMPLANT
UNDERPAD 30X36 HEAVY ABSORB (UNDERPADS AND DIAPERS) ×2 IMPLANT
WATER STERILE IRR 1000ML POUR (IV SOLUTION) ×2 IMPLANT

## 2020-10-03 NOTE — Op Note (Addendum)
OPERATIVE NOTE   PROCEDURE: left below-the-knee amputation  PRE-OPERATIVE DIAGNOSIS: non-healing left transmetatarsal amputation  POST-OPERATIVE DIAGNOSIS: same as above  SURGEON: [name], MD  ASSISTANT(S): Doreatha Massed, PA  ANESTHESIA: Regional with LMA  ESTIMATED BLOOD LOSS: <100 mL  FINDING(S): Left below knee amputation with healthy tissue margins and significant edema.  SPECIMEN(S):  left below-the-knee amputation  INDICATIONS:   Anthony Park is a 60 y.o. male who presents with left non-healing TMA and severe pain.  The patient is scheduled for a left below-the-knee amputation.  I discussed in depth with the patient the risks, benefits, and alternatives to this procedure.  The patient is aware that the risk of this operation included but are not limited to:  bleeding, infection, myocardial infarction, stroke, death, failure to heal amputation wound, and possible need for more proximal amputation.  The patient is aware of the risks and agrees proceed forward with the procedure.  An assistant was needed for exposure and to expedite the case.   DESCRIPTION:  After full informed written consent was obtained from the patient, the patient was brought back to the operating room, and placed supine upon the operating table.  Prior to induction, the patient received IV antibiotics.  The patient was then prepped and draped in the standard fashion for a below-the-knee amputation. After obtaining adequate anesthesia, the patient was prepped and draped in the standard fashion for a left below-the-knee amputation.  I marked out the anterior incision 10 cm distal to the tibial tuberosity and then the marked out a anterior inicison that was two third of the circumference of the calf and a posterior flap that was one third of the circumference  of the calf in length.   I made the incisions for these flaps, and then dissected through the subcutaneous tissue, fascia, and muscle  anteriorly with electrocautery.  I also similarly developed a thick posterior flap of muscle with electrocautery.  There was significant edema in the soft tissue.  I elevated  the periosteal tissue superiorly so that the tibia was about 3 cm shorter than the anterior skin flap.  I then transected the tibia with a power saw and then I smoothed out the rough edges with a saw.  In a similar fashion, I cut back the fibula about two centimeters higher than the level of the tibia with a saw well.  I then finished releasing the posterior muscle flap with electrocautery.  The anterior tibial, peroneal, and posterior tibial vessel were all clamped with hemostats and ligated with 3-0 silk suture.  At this point, the specimen was passed off the field as the below-the-knee amputation.  Bleeding continued to be controlled with electrocautery and suture ligature.  The stump was washed off with sterile normal saline and no further active bleeding was noted.    I reapproximated the anterior and posterior fascia  with interrupted stitches of 2-0 Vicryl.  This was completed along the entire length of anterior and posterior fascia until there were no more loose space in the fascial line.  The skin was then reapproximated with staples.  The stump was washed off and dried.    The incision was dressed with [employer] and  then fluffs were applied.  Kerlix was wrapped around the leg and then gently an ACE wrap was applied.     COMPLICATIONS: None  CONDITION: Stable   [name], MD [facility] Office: [phone]   [name]

## 2020-10-03 NOTE — Anesthesia Procedure Notes (Signed)
Anesthesia Regional Block: Popliteal block   Pre-Anesthetic Checklist: ,, timeout performed, Correct Patient, Correct Site, Correct Laterality, Correct Procedure, Correct Position, site marked, Risks and benefits discussed,  Surgical consent,  Pre-op evaluation,  At surgeon's request and post-op pain management  Laterality: Left  Prep: Dura Prep       Needles:  Injection technique: Single-shot  Needle Type: Echogenic Stimulator Needle     Needle Length: 9cm  Needle Gauge: 20     Additional Needles:   Procedures:,,,, ultrasound used (permanent image in chart),,,,  Narrative:  Start time: 10/03/2020 9:10 AM End time: 10/03/2020 9:15 AM Injection made incrementally with aspirations every 5 mL.  Performed by: Personally  Anesthesiologist: Atilano Median, DO  Additional Notes: Patient identified. Risks/Benefits/Options discussed with patient including but not limited to bleeding, infection, nerve damage, failed block, incomplete pain control. Patient expressed understanding and wished to proceed. All questions were answered. Sterile technique was used throughout the entire procedure. Please see nursing notes for vital signs. Aspirated in 5cc intervals with injection for negative confirmation. Patient was given instructions on fall risk and not to get out of bed. All questions and concerns addressed with instructions to call with any issues or inadequate analgesia.

## 2020-10-03 NOTE — Anesthesia Postprocedure Evaluation (Signed)
Anesthesia Post Note  Patient: Anthony Park  Procedure(s) Performed: LEFT BELOW KNEE AMPUTATION (Left Knee)     Patient location during evaluation: PACU Anesthesia Type: Regional and General Level of consciousness: awake and alert Pain management: pain level controlled Vital Signs Assessment: post-procedure vital signs reviewed and stable Respiratory status: spontaneous breathing, nonlabored ventilation, respiratory function stable and patient connected to nasal cannula oxygen Cardiovascular status: blood pressure returned to baseline and stable Postop Assessment: no apparent nausea or vomiting Anesthetic complications: no   No complications documented.  Last Vitals:  Vitals:   10/03/20 1155 10/03/20 1210  BP: (!) 150/94 (!) 154/96  Pulse: 97 88  Resp: 16 16  Temp:  (!) 36.2 C  SpO2: 98% 95%    Last Pain:  Vitals:   10/03/20 1210  TempSrc:   PainSc: 4                  Jahkai Yandell P Estellar Cadena

## 2020-10-03 NOTE — Anesthesia Procedure Notes (Signed)
Anesthesia Regional Block: Adductor canal block   Pre-Anesthetic Checklist: ,, timeout performed, Correct Patient, Correct Site, Correct Laterality, Correct Procedure, Correct Position, site marked, Risks and benefits discussed,  Surgical consent,  Pre-op evaluation,  At surgeon's request and post-op pain management  Laterality: Left  Prep: Dura Prep       Needles:  Injection technique: Single-shot  Needle Type: Echogenic Stimulator Needle     Needle Length: 9cm  Needle Gauge: 20     Additional Needles:   Procedures:,,,, ultrasound used (permanent image in chart),,,,  Narrative:  Start time: 10/03/2020 9:05 AM End time: 10/03/2020 9:10 AM Injection made incrementally with aspirations every 5 mL.  Performed by: Personally  Anesthesiologist: Atilano Median, DO  Additional Notes: Patient identified. Risks/Benefits/Options discussed with patient including but not limited to bleeding, infection, nerve damage, failed block, incomplete pain control. Patient expressed understanding and wished to proceed. All questions were answered. Sterile technique was used throughout the entire procedure. Please see nursing notes for vital signs. Aspirated in 5cc intervals with injection for negative confirmation. Patient was given instructions on fall risk and not to get out of bed. All questions and concerns addressed with instructions to call with any issues or inadequate analgesia.

## 2020-10-03 NOTE — Transfer of Care (Signed)
Immediate Anesthesia Transfer of Care Note  Patient: Anthony Park  Procedure(s) Performed: LEFT BELOW KNEE AMPUTATION (Left Knee)  Patient Location: OR #12  Anesthesia Type:GA combined with regional for post-op pain  Level of Consciousness: awake, oriented, drowsy, patient cooperative and responds to stimulation  Airway & Oxygen Therapy: Patient Spontanous Breathing and Patient connected to nasal cannula oxygen  Post-op Assessment: Report given to RN, Post -op Vital signs reviewed and stable and Patient moving all extremities X 4  Post vital signs: Reviewed and stable  Last Vitals:  Vitals Value Taken Time  BP    Temp    Pulse    Resp    SpO2      Last Pain:  Vitals:   10/03/20 0650  TempSrc:   PainSc: 6       Patients Stated Pain Goal: 3 (10/03/20 0650)  Complications: No complications documented.

## 2020-10-03 NOTE — Telephone Encounter (Signed)
10/03/20 All paper worked filled out and sent to [employer] @ [employer] # [phone] for disability.  [employer]

## 2020-10-03 NOTE — H&P (Signed)
History and Physical:    Anthony Park   NLG:[id number] DOB: March 12, 1961 DOA: 10/03/2020  Referring MD/provider: Dr Sherald Hess  PCP: Patient, No Pcp Per   Patient coming from: Home  Chief Complaint: Status post left BKA earlier today  History of Present Illness:   Anthony Park is an 60 y.o. male with PMH significant for peripheral vascular disease status post multiple episodes of osteomyelitis and amputation status post left TMA, DM 2, HTN and recent cessation of alcohol was admitted today after left BKA secondary to ongoing infection/osteomyelitis and nonhealing of previous left TMA. Routine preop labs noted that patient was COVID-positive.  He was noted to be asymptomatic at the time.  Patient states that other than his leg he feels fine.  Specifically denies shortness of breath, cough, sore throat, sinus congestion, nausea, vomiting, diarrhea, loss of appetite taste or smell.  Review of systems is positive for "dark stool since I started on that antibiotic Doxy".  Patient states his stools had begun to lighten up but they were still dark.  Denies any blood in his stools.  Denies any blood in his vomitus.  Denies any previous history of bleeding.  Drinks two "toddys" a night.  Has not had any difficulty with quitting alcohol in the past.  Tobacco 50 days ago   ED Course: N/A.  Patient was directly admitted from the OR status post left BKA  ROS:   ROS   Review of Systems: General: Denies fever, chills, malaise,  Respiratory: Denies cough, SOB at rest or hemoptysis Cardiovascular: Denies chest pain or palpitations GI: Denies nausea, vomiting, diarrhea or constipation GU: Denies dysuria, frequency or hematuria   Past Medical History:   Past Medical History:  Diagnosis Date   Arthritis    Diabetes mellitus without complication (HCC)    type 2 controlled with diet    Diabetic toe ulcer (HCC) 11/05/2018   History of kidney stones    passed 1 several years  ago   Hyperlipidemia    Hypertension    not on medications   Peripheral vascular disease (HCC)    stent in left leg due to anerysm     Past Surgical History:   Past Surgical History:  Procedure Laterality Date    Left Transmetatarsal Ampuation (Left Foot)  08/20/2020   ABDOMINAL AORTOGRAM W/LOWER EXTREMITY N/A 08/06/2020   Procedure: ABDOMINAL AORTOGRAM W/LOWER EXTREMITY;  Surgeon: [name], MD;  Location: [facility];  Service: Cardiovascular;  Laterality: N/A;   AMPUTATION Right 11/07/2018   Procedure: RIGHT 3RD TOE AMPUTATION;  Surgeon: Nadara Mustard, MD;  Location: [facility] OR;  Service: Orthopedics;  Laterality: Right;   AMPUTATION Left 08/20/2020   Procedure: Left Transmetatarsal Ampuation;  Surgeon: [name], MD;  Location: [facility];  Service: Vascular;  Laterality: Left;   AMPUTATION TOE Left    big toe and one next to it   CATARACT EXTRACTION Bilateral    EYE SURGERY Bilateral    cataracts removed   PERIPHERAL VASCULAR BALLOON ANGIOPLASTY Left 08/06/2020   Procedure: PERIPHERAL VASCULAR BALLOON ANGIOPLASTY;  Surgeon: [name], MD;  Location: [facility];  Service: Cardiovascular;  Laterality: Left;  Peroneal artery.   TOE AMPUTATION Left 2015   TOTAL HIP ARTHROPLASTY Left 05/23/2020   Procedure: LEFT TOTAL HIP ARTHROPLASTY ANTERIOR APPROACH;  Surgeon: Kathryne Hitch, MD;  Location: [facility];  Service: Orthopedics;  Laterality: Left;   WOUND DEBRIDEMENT Left 09/11/2020   Procedure: DEBRIDEMENT  OF LEFT TRANSMETATARSAL WOUND;  Surgeon: Leonie DouglasHawken, Thomas N, MD;  [facility];  Service: Vascular;  Laterality: Left;    Social History:   Social History   Socioeconomic History   Marital status: Single    Spouse name: Jasmine DecemberSharon   Number of children: 3   Years of education: 12   Highest education level: Not on file  Occupational History   Not on file  Tobacco Use   Smoking status: Former Smoker    Packs/day:  0.50    Years: 41.00    Pack years: 20.50    Types: Cigarettes    Quit date: 07/20/2020    Years since quitting: 0.2   Smokeless tobacco: Never Used  Vaping Use   Vaping Use: Never used  Substance and Sexual Activity   Alcohol use: Yes    Alcohol/week: 14.0 standard drinks    Types: 14 Standard drinks or equivalent per week   Drug use: Not Currently    Comment: hx of marijuana 15 years ago    Sexual activity: Yes  Other Topics Concern   Not on file  Social History Narrative   Not on file   Social Determinants of Health   Financial Resource Strain: Not on file  Food Insecurity: Not on file  Transportation Needs: Not on file  Physical Activity: Not on file  Stress: Not on file  Social Connections: Not on file  Intimate Partner Violence: Not on file    Allergies   Meloxicam  Family history:   Family History  Problem Relation Age of Onset   COPD Mother    Cataracts Mother    Brain cancer Father    Hypertension Brother     Current Medications:   Prior to Admission medications   Medication Sig Start Date End Date Taking? Authorizing Provider  acetaminophen (TYLENOL) 500 MG tablet Take 1,000 mg by mouth every 6 (six) hours as needed for moderate pain.   Yes [provider]  ascorbic acid (VITAMIN C) 500 MG tablet Take 500 mg by mouth daily.   Yes [provider]  aspirin EC 81 MG tablet Take 1 tablet (81 mg total) by mouth 2 (two) times daily after a meal. Swallow whole. Patient taking differently: Take 81 mg by mouth daily. Swallow whole. 05/22/20  Yes Kathryne HitchBlackman, Christopher Y, MD  atorvastatin (LIPITOR) 10 MG tablet Take 1 tablet (10 mg total) by mouth daily. 08/06/20 08/06/21 Yes Cephus Shellinglark, Christopher J, MD  ceFEPIme 2 g in sodium chloride 0.9 % 100 mL Inject 2 g into the vein every 12 (twelve) hours.   Yes [provider]  cholecalciferol (VITAMIN D3) 25 MCG (1000 UNIT) tablet Take 1,000 Units by mouth daily.   Yes [provider]  clopidogrel (PLAVIX) 75 MG tablet Take 1 tablet (75 mg total) by mouth daily. 08/06/20 08/06/21 Yes Cephus Shellinglark, Christopher J, MD  GARLIC PO Take 960200 mg by mouth daily.    Yes [provider]  lisinopril (ZESTRIL) 20 MG tablet Take 1 tablet (20 mg total) by mouth daily. 09/23/20  Yes Baglia, Corrina, PA-C  metFORMIN (GLUCOPHAGE) 500 MG tablet Take 1 tablet (500 mg total) by mouth 2 (two) times daily with a meal. 08/24/20 10/23/20 Yes Emilie RutterEveland, Matthew, PA-C  Turmeric 500 MG CAPS Take 500 mg by mouth daily.   Yes [provider]  ciprofloxacin (CIPRO) 500 MG tablet Take 1 tablet (500 mg total) by mouth 2 (two) times daily. Patient not taking: No sig reported 09/12/20  Leonie Douglas, MD  doxycycline (ADOXA) 100 MG tablet Take 1 tablet (100 mg total) by mouth 2 (two) times daily. Patient not taking: No sig reported 09/02/20   [name], MD  meloxicam (MOBIC) 7.5 MG tablet Take 1 tablet (7.5 mg total) by mouth daily. Patient not taking: Reported on 09/30/2020 09/23/20   Baglia, Corrina, PA-C  oxyCODONE (OXY IR/ROXICODONE) 5 MG immediate release tablet Take 1 tablet (5 mg total) by mouth every 6 (six) hours as needed for severe pain. Patient not taking: No sig reported 09/12/20   Leonie Douglas, MD    Physical Exam:   Vitals:   10/03/20 0900 10/03/20 1140 10/03/20 1155 10/03/20 1210  BP: (!) 169/102 135/86 (!) 150/94 (!) 154/96  Pulse:  (!) 104 97 88  Resp:  16 16 16   Temp:  (!) 97 F (36.1 C)    TempSrc:      SpO2: 97% 98% 98% 95%  Weight:      Height:         Physical Exam: Blood pressure (!) 154/96, pulse 88, temperature (!) 97 F (36.1 C), resp. rate 16, height 6' (1.829 m), weight 104.3 kg, SpO2 95 %. Gen: Patient lying flat in bed postop in NAD. Eyes: sclera anicteric, conjuctiva mildly injected bilaterally CVS: S1-S2, regulary, no gallops Respiratory:  decreased air entry likely secondary to decreased inspiratory effort GI: NABS, soft,  NT  LE: Status post BKA, CDI Neuro: grossly nonfocal.  Psych:  mood and affect appropriate to situation.   Data Review:    Labs: Basic Metabolic Panel: Recent Labs  Lab 10/03/20 0630  NA 140  K 4.4  CL 108  CO2 20*  GLUCOSE 127*  BUN 8  CREATININE 0.58*  CALCIUM 9.1   Liver Function Tests: Recent Labs  Lab 10/03/20 0630  AST 26  ALT 15  ALKPHOS 46  BILITOT 1.0  PROT 6.6  ALBUMIN 3.4*   No results for input(s): LIPASE, AMYLASE in the last 168 hours. No results for input(s): AMMONIA in the last 168 hours. CBC: Recent Labs  Lab 10/03/20 0630  WBC 9.9  HGB 9.3*  HCT 30.3*  MCV 91.8  PLT 290   Cardiac Enzymes: No results for input(s): CKTOTAL, CKMB, CKMBINDEX, TROPONINI in the last 168 hours.  BNP (last 3 results) No results for input(s): PROBNP in the last 8760 hours. CBG: Recent Labs  Lab 10/03/20 0544  GLUCAP 129*    Urinalysis    Component Value Date/Time   COLORURINE YELLOW 10/03/2020 0606   APPEARANCEUR CLEAR 10/03/2020 0606   LABSPEC 1.016 10/03/2020 0606   PHURINE 5.0 10/03/2020 0606   GLUCOSEU NEGATIVE 10/03/2020 0606   HGBUR SMALL (A) 10/03/2020 0606   BILIRUBINUR NEGATIVE 10/03/2020 0606   KETONESUR NEGATIVE 10/03/2020 0606   PROTEINUR 30 (A) 10/03/2020 0606   NITRITE NEGATIVE 10/03/2020 0606   LEUKOCYTESUR NEGATIVE 10/03/2020 0606      Radiographic Studies: No results found.  EKG: Ordered and pending   Assessment/Plan:   Principal Problem:   S/P BKA (below knee amputation) unilateral, left (HCC) Active Problems:   Type 2 diabetes mellitus with foot ulcer, without long-term current use of insulin (HCC)   Essential hypertension   Mixed hyperlipidemia   Morbid obesity (HCC)   PAD (peripheral artery disease) (HCC)   History of amputation of left foot through metatarsal bone (HCC)  60 year old man with DM2, significant vasculopathy was electively admitted for left BKA secondary to nonhealing left TMA and ongoing  osteomyelitis.  He was noted to be incidentally COVID-positive and is entirely asymptomatic at present.  Of note there has been a 2.5 drop in his hemoglobin over the past 3 weeks of unclear etiology.  Patient is hemodynamically stable.   Status post left BKA BKA orders per vascular surgery Pain management per vascular surgery DVT management per vascular surgery  Infection with COVID-19 virus Patient is asymptomatic Will order chest x-ray, CRP, D-dimer and procalcitonin At present, patient without indication for either steroids Given no symptoms at all, will also not start remdesivir at present however would have a low threshold for starting remdesivir should he develop any symptoms referable to COVID-19  Normocytic normochromic anemia Of note patient has had 2.5 drop in hemoglobin, down from 12.6 on December 30 2-10.0 last week. Patient had undergone debridement of TMA on 09/11/2020 although I cannot imagine he stop much blood. H&H is stable today at 9.3/30, drawn prior to the amputation ROS is notable for dark stool "since I started Doxy". Will order stool for occult blood, iron/B12, folate/TSH  to start diagnostic work-up  PVD Continue aspirin and Plavix to start tomorrow Could continue discontinuing if fecal occult blood is positive or hemoglobin continues to fall. Continue atorvastatin  DM2 Hold metformin SSI AC at bedtime, no basal insulin started at this time Will benefit from tight glucose control  HTN Restart antihypertensives to start in the morning    Other information:   DVT prophylaxis: Heparin ordered. Code Status: Full Family Communication: Per vascular surgery Disposition Plan: TBD Consults called: Vascular surgery Admission status: Inpatient  Carmencita Cusic Tublu Alaster Asfaw [facility]  If 7PM-7AM, please contact night-coverage [email or web address] Password [facility] 10/03/2020, 12:28 PM

## 2020-10-03 NOTE — Anesthesia Procedure Notes (Signed)
Procedure Name: LMA Insertion Date/Time: 10/03/2020 10:16 AM Performed by: Melina Schools, CRNA Pre-anesthesia Checklist: Patient identified, Emergency Drugs available, Suction available, Patient being monitored and Timeout performed Patient Re-evaluated:Patient Re-evaluated prior to induction Oxygen Delivery Method: Circle system utilized Preoxygenation: Pre-oxygenation with 100% oxygen Induction Type: IV induction Ventilation: Mask ventilation without difficulty LMA: LMA inserted LMA Size: 5.0 Tube size: 5.0 mm Number of attempts: 1 Placement Confirmation: positive ETCO2 and breath sounds checked- equal and bilateral Tube secured with: Tape Dental Injury: Teeth and Oropharynx as per pre-operative assessment

## 2020-10-03 NOTE — Progress Notes (Signed)
Inpatient Rehab Admissions Coordinator:  Consult received.  However noted pt COVID + 10/02/20. Patients are eligible to be considered for admit to CIR when cleared from airborne precautions by acute MD or Infectious Disease regardless of onset date. Otherwise they will need to be >20 days from their positive test with recovery/improvement in symptoms or 2 negative tests. Will continue to follow.    Wolfgang Phoenix, MS, CCC-SLP Admissions Coordinator [phone]

## 2020-10-03 NOTE — H&P (Signed)
History and Physical Interval Note:  10/03/2020 9:35 AM  Anthony Park  has presented today for surgery, with the diagnosis of PAD.  The various methods of treatment have been discussed with the patient and family. After consideration of risks, benefits and other options for treatment, the patient has consented to  Procedure(s): LEFT BELOW KNEE AMPUTATION (Left) as a surgical intervention.  The patient's history has been reviewed, patient examined, no change in status, stable for surgery.  I have reviewed the patient's chart and labs.  Questions were answered to the patient's satisfaction.    Plan left BKA for nonhealing TMA after multiple revisions and debridements.  He is COVID-positive but is asymptomatic.  I did discuss that I am willing to proceed with the surgery given he has been in severe pain and I discussed this could have some impact on his qualification for CIR.  He understands and wishes to proceed.  [name]  Patient name: Anthony Park         MRN: [id number]        DOB: Apr 23, 1961          Sex: male  REASON FOR VISIT: Follow-up and wound check of left TMA  HPI: Anthony Park is a 60 y.o. male with hx of DM, PVD, and HTN who presents for wound check of his left TMA. TMA was done on 08/20/2020 for CLI with tissue loss. Prior to this he underwent left lower extremity angiogram on 08/06/20 that showed a patent left popliteal Viabahn stent placed at [facility] and we did perform a left peroneal angioplasty. He had significant tibial and small vessel disease.  My partner Dr. Lenell Antu later took him to the OR for left TMA debridement on 09/11/2020.  He has been coming in the office ultimately getting VAC changes.  He has also had a PICC line placed for antibiotics and is being followed by ID.      Past Medical History:  Diagnosis Date  . Arthritis   . Diabetes mellitus without complication (HCC)    type 2 controlled with diet   . Diabetic toe ulcer (HCC) 11/05/2018  .  History of kidney stones    passed 1 several years ago  . Hyperlipidemia   . Hypertension    not on medications  . Peripheral vascular disease (HCC)    stent in left leg due to anerysm          Past Surgical History:  Procedure Laterality Date  .  Left Transmetatarsal Ampuation (Left Foot)  08/20/2020  . ABDOMINAL AORTOGRAM W/LOWER EXTREMITY N/A 08/06/2020   Procedure: ABDOMINAL AORTOGRAM W/LOWER EXTREMITY;  Surgeon: [name], MD;  Location: [facility];  Service: Cardiovascular;  Laterality: N/A;  . AMPUTATION Right 11/07/2018   Procedure: RIGHT 3RD TOE AMPUTATION;  Surgeon: Nadara Mustard, MD;  Location: [facility] OR;  Service: Orthopedics;  Laterality: Right;  . AMPUTATION Left 08/20/2020   Procedure: Left Transmetatarsal Ampuation;  Surgeon: [name], MD;  Location: [facility] OR;  Service: Vascular;  Laterality: Left;  . AMPUTATION TOE Left    big toe and one next to it  . CATARACT EXTRACTION Bilateral   . PERIPHERAL VASCULAR BALLOON ANGIOPLASTY Left 08/06/2020   Procedure: PERIPHERAL VASCULAR BALLOON ANGIOPLASTY;  Surgeon: [name], MD;  Location: [facility];  Service: Cardiovascular;  Laterality: Left;  Peroneal artery.  . TOE AMPUTATION Left 2015  . TOTAL HIP ARTHROPLASTY Left 05/23/2020   Procedure: LEFT TOTAL  HIP ARTHROPLASTY ANTERIOR APPROACH;  Surgeon: Kathryne Hitch, MD;  Location: [facility];  Service: Orthopedics;  Laterality: Left;  . WOUND DEBRIDEMENT Left 09/11/2020   Procedure: DEBRIDEMENT OF LEFT TRANSMETATARSAL WOUND;  Surgeon: Leonie Douglas, MD;  Location: [facility] OR;  Service: Vascular;  Laterality: Left;         Family History  Problem Relation Age of Onset  . COPD Mother   . Cataracts Mother   . Brain cancer Father   . Hypertension Brother     SOCIAL HISTORY: Social History        Tobacco Use  . Smoking status: Former Smoker    Packs/day: 0.50    Years: 41.00    Pack years:  20.50    Types: Cigarettes    Quit date: 07/20/2020    Years since quitting: 0.1  . Smokeless tobacco: Never Used  Substance Use Topics  . Alcohol use: Yes    Alcohol/week: 7.0 standard drinks    Types: 7 Shots of liquor per week    Comment: 7 or more a week    No Known Allergies  Current Outpatient Medications  Medication Sig Dispense Refill  . acetaminophen (TYLENOL) 500 MG tablet Take 1,000 mg by mouth every 6 (six) hours as needed for moderate pain.    Marland Kitchen aspirin EC 81 MG tablet Take 1 tablet (81 mg total) by mouth 2 (two) times daily after a meal. Swallow whole. (Patient taking differently: Take 81 mg by mouth daily. Swallow whole.) 30 tablet 0  . atorvastatin (LIPITOR) 10 MG tablet Take 1 tablet (10 mg total) by mouth daily. 30 tablet 11  . clopidogrel (PLAVIX) 75 MG tablet Take 1 tablet (75 mg total) by mouth daily. 30 tablet 11  . GARLIC PO Take 245 mg by mouth daily.     Marland Kitchen lisinopril (ZESTRIL) 20 MG tablet TAKE 1 TABLET BY MOUTH EVERY DAY (Patient taking differently: Take 20 mg by mouth daily.) 90 tablet 1  . lisinopril (ZESTRIL) 20 MG tablet Take 1 tablet (20 mg total) by mouth daily. 30 tablet 0  . metFORMIN (GLUCOPHAGE) 500 MG tablet Take 1 tablet (500 mg total) by mouth 2 (two) times daily with a meal. 60 tablet 1  . ascorbic acid (VITAMIN C) 500 MG tablet Take 500 mg by mouth daily. (Patient not taking: Reported on 09/30/2020)    . cholecalciferol (VITAMIN D3) 25 MCG (1000 UNIT) tablet Take 1,000 Units by mouth daily. (Patient not taking: Reported on 09/30/2020)    . ciprofloxacin (CIPRO) 500 MG tablet Take 1 tablet (500 mg total) by mouth 2 (two) times daily. (Patient not taking: No sig reported) 20 tablet 0  . doxycycline (ADOXA) 100 MG tablet Take 1 tablet (100 mg total) by mouth 2 (two) times daily. (Patient not taking: Reported on 09/30/2020) 14 tablet 0  . meloxicam (MOBIC) 7.5 MG tablet Take 1 tablet (7.5 mg total) by mouth daily. (Patient not  taking: Reported on 09/30/2020) 20 tablet 1  . oxyCODONE (OXY IR/ROXICODONE) 5 MG immediate release tablet Take 1 tablet (5 mg total) by mouth every 6 (six) hours as needed for severe pain. (Patient not taking: Reported on 09/30/2020) 20 tablet 0  . Turmeric 500 MG CAPS Take 500 mg by mouth daily. (Patient not taking: Reported on 09/30/2020)     No current facility-administered medications for this visit.    REVIEW OF SYSTEMS:  [X]  denotes positive finding, [ ]  denotes negative finding Cardiac  Comments:  Chest pain or chest  pressure:    Shortness of breath upon exertion:    Short of breath when lying flat:    Irregular heart rhythm:        Vascular    Pain in calf, thigh, or hip brought on by ambulation:    Pain in feet at night that wakes you up from your sleep:     Blood clot in your veins:    Leg swelling:         Pulmonary    Oxygen at home:    Productive cough:     Wheezing:         Neurologic    Sudden weakness in arms or legs:     Sudden numbness in arms or legs:     Sudden onset of difficulty speaking or slurred speech:    Temporary loss of vision in one eye:     Problems with dizziness:         Gastrointestinal    Blood in stool:     Vomited blood:         Genitourinary    Burning when urinating:     Blood in urine:        Psychiatric    Major depression:         Hematologic    Bleeding problems:    Problems with blood clotting too easily:        Skin    Rashes or ulcers:        Constitutional    Fever or chills:      PHYSICAL EXAM:    Vitals:   09/30/20 1156  BP: (!) 152/86  Pulse: 78  Resp: 18  Temp: 98.1 F (36.7 C)  TempSrc: Temporal  SpO2: 99%  Weight: 230 lb (104.3 kg)  Height: 6' (1.829 m)    GENERAL: The patient is a well-nourished male, in no acute distress. The vital signs are documented above. CARDIAC: There is a regular  rate and rhythm.  VASCULAR:  Left TMA necrotic as pictured below    DATA:   None  Assessment/Plan:  60 year old male presented with CLI with tissue loss and has undergone angiogram with tibial intervention and now TMA (his previous left popliteal stent placed at [facility] was widely patent on recent angiogram).  Ultimately his TMA is nonhealing as pictured above and he has had additional debridements in the OR with VAC placement.  The wound has ultimately failed to heal.  I subsequently have recommended a left below-knee amputation today.  Risk and benefits were discussed in detail including risk of non-healing.  [name], MD [facility] Office: [phone]

## 2020-10-04 ENCOUNTER — Encounter (HOSPITAL_COMMUNITY): Payer: Self-pay | Admitting: Vascular Surgery

## 2020-10-04 DIAGNOSIS — I10 Essential (primary) hypertension: Secondary | ICD-10-CM

## 2020-10-04 DIAGNOSIS — U071 COVID-19: Secondary | ICD-10-CM

## 2020-10-04 DIAGNOSIS — I739 Peripheral vascular disease, unspecified: Secondary | ICD-10-CM

## 2020-10-04 LAB — BASIC METABOLIC PANEL
Anion gap: 11 (ref 5–15)
BUN: 11 mg/dL (ref 6–20)
CO2: 22 mmol/L (ref 22–32)
Calcium: 8.4 mg/dL — ABNORMAL LOW (ref 8.9–10.3)
Chloride: 102 mmol/L (ref 98–111)
Creatinine, Ser: 0.71 mg/dL (ref 0.61–1.24)
GFR, Estimated: >60 mL/min (ref >60)
Glucose, Bld: 191 mg/dL — ABNORMAL HIGH (ref 70–99)
Potassium: 4.1 mmol/L (ref 3.5–5.1)
Sodium: 135 mmol/L (ref 135–145)

## 2020-10-04 LAB — CBC
HCT: 25.7 % — ABNORMAL LOW (ref 39.0–52.0)
Hemoglobin: 8.5 g/dL — ABNORMAL LOW (ref 13.0–17.0)
MCH: 29.6 pg (ref 26.0–34.0)
MCHC: 33.1 g/dL (ref 30.0–36.0)
MCV: 89.5 fL (ref 80.0–100.0)
Platelets: 273 10*3/uL (ref 150–400)
RBC: 2.87 MIL/uL — ABNORMAL LOW (ref 4.22–5.81)
RDW: 16 % — ABNORMAL HIGH (ref 11.5–15.5)
WBC: 11.1 10*3/uL — ABNORMAL HIGH (ref 4.0–10.5)
nRBC: 0 % (ref 0.0–0.2)

## 2020-10-04 LAB — GLUCOSE, CAPILLARY
Glucose-Capillary: 154 mg/dL — ABNORMAL HIGH (ref 70–99)
Glucose-Capillary: 128 mg/dL — ABNORMAL HIGH (ref 70–99)
Glucose-Capillary: 185 mg/dL — ABNORMAL HIGH (ref 70–99)
Glucose-Capillary: 149 mg/dL — ABNORMAL HIGH (ref 70–99)

## 2020-10-04 LAB — TSH: TSH: 0.431 u[IU]/mL (ref 0.350–4.500)

## 2020-10-04 LAB — IRON AND TIBC
Iron: 30 ug/dL — ABNORMAL LOW (ref 45–182)
Saturation Ratios: 8 % — ABNORMAL LOW (ref 17.9–39.5)
TIBC: 385 ug/dL (ref 250–450)
UIBC: 355 ug/dL

## 2020-10-04 LAB — FOLATE: Folate: 4.6 ng/mL — ABNORMAL LOW (ref >5.9)

## 2020-10-04 LAB — FERRITIN: Ferritin: 85 ng/mL (ref 24–336)

## 2020-10-04 LAB — ACID FAST CULTURE WITH REFLEXED SENSITIVITIES (MYCOBACTERIA): Acid Fast Culture: NEGATIVE

## 2020-10-04 LAB — VITAMIN B12: Vitamin B-12: 1080 pg/mL — ABNORMAL HIGH (ref 180–914)

## 2020-10-04 MED ORDER — FOLIC ACID 1 MG PO TABS
1.0000 mg | ORAL_TABLET | Freq: Every day | ORAL | Status: DC
  Administered 2020-10-04 – 2020-10-06 (×3): 1 mg via ORAL
  Filled 2020-10-04 (×3): qty 1

## 2020-10-04 MED ORDER — METHOCARBAMOL 500 MG PO TABS
500.0000 mg | ORAL_TABLET | Freq: Four times a day (QID) | ORAL | Status: DC | PRN
  Administered 2020-10-04 – 2020-10-06 (×4): 500 mg via ORAL
  Filled 2020-10-04 (×4): qty 1

## 2020-10-04 MED ORDER — CHLORHEXIDINE GLUCONATE CLOTH 2 % EX PADS
6.0000 | MEDICATED_PAD | Freq: Every day | CUTANEOUS | Status: DC
  Administered 2020-10-06: 6 via TOPICAL

## 2020-10-04 MED ORDER — CHLORHEXIDINE GLUCONATE CLOTH 2 % EX PADS
6.0000 | MEDICATED_PAD | Freq: Every day | CUTANEOUS | Status: DC
  Administered 2020-10-04 – 2020-10-05 (×2): 6 via TOPICAL

## 2020-10-04 NOTE — Evaluation (Signed)
Physical Therapy Evaluation Patient Details Name: Anthony Park MRN: [id number] DOB: 07-Dec-1960 Today's Date: 10/04/2020   History of Present Illness  60 y.o. male with PMH significant for peripheral vascular disease status post multiple episodes of osteomyelitis and amputation status post left TMA, DM 2, HTN and recent cessation of alcohol was admitted today after left BKA (10/03/2020) secondary to ongoing infection/osteomyelitis and nonhealing of previous left TMA.  Clinical Impression  Pt presents to PT with deficits in functional mobility, gait, balance, power, endurance, and with some L residual limb pain. Pt currently requires assistance for transfers due to impaired balance, showing some improvement with PT cues for hand placement. Pt is able to hop well with use of RW as he has been hopping on RLE since prior to this admission. PT provides education on HEP to improve ROM and strength in residual limb. Pt will benefit from aggressive mobilization and acute PT POC to further improve balance and independence in household mobility. PT recommends discharge home with HHPT services, a manual wheelchair, and assistance from spouse.    Follow Up Recommendations Home health PT;Supervision for mobility/OOB    Equipment Recommendations  Wheelchair (measurements PT);Wheelchair cushion (measurements PT)    Recommendations for Other Services       Precautions / Restrictions Precautions Precautions: Fall Restrictions Weight Bearing Restrictions: Yes LLE Weight Bearing: Non weight bearing      Mobility  Bed Mobility Overal bed mobility: Modified Independent             General bed mobility comments: sit to supine modI    Transfers Overall transfer level: Needs assistance Equipment used: Rolling walker (2 wheeled) Transfers: Sit to/from [employer] Sit to Stand: Min guard Stand pivot transfers: Supervision       General transfer comment: PT cues for hand  placement to improve balance in sit to stand  Ambulation/Gait Ambulation/Gait assistance: Min guard Gait Distance (Feet): 30 Feet Assistive device: Rolling walker (2 wheeled) Gait Pattern/deviations:  (hop-to gait) Gait velocity: reduced Gait velocity interpretation: <1.31 ft/sec, indicative of household ambulator General Gait Details: pt with short hops on RLE, no LOB noted  Stairs            Wheelchair Mobility    Modified Rankin (Stroke Patients Only)       Balance Overall balance assessment: Needs assistance Sitting-balance support: No upper extremity supported;Feet supported Sitting balance-Leahy Scale: Good     Standing balance support: Bilateral upper extremity supported Standing balance-Leahy Scale: Poor Standing balance comment: reliant on BUE support of RW                             Pertinent Vitals/Pain Pain Assessment: 0-10 Pain Score: 3  Faces Pain Scale: Hurts even more Pain Location: L residual limb Pain Descriptors / Indicators: Throbbing Pain Intervention(s): Monitored during session    Home Living Family/patient expects to be discharged to:: Private residence Living Arrangements: Spouse/significant other Available Help at Discharge: Family;Available 24 hours/day Type of Home: House Home Access: Stairs to enter Entrance Stairs-Rails: Left Entrance Stairs-Number of Steps: 1 Home Layout: One level Home Equipment: Walker - 2 wheels;Cane - single point;Hand held shower head Additional Comments: Wife works from home, has flexible job and can assist pt as needed    Prior Function Level of Independence: Independent with assistive device(s)         Comments: pt has been hopping on RLE recently with use of walker even prior  to L BKA     Hand Dominance   Dominant Hand: Right    Extremity/Trunk Assessment   Upper Extremity Assessment Upper Extremity Assessment: Overall WFL for tasks assessed    Lower Extremity  Assessment Lower Extremity Assessment: LLE deficits/detail LLE Deficits / Details: L knee flexion limited by pain, active extension to neutral, no MMT performed due to pain and WB status    Cervical / Trunk Assessment Cervical / Trunk Assessment: Normal  Communication   Communication: No difficulties  Cognition Arousal/Alertness: Awake/alert Behavior During Therapy: WFL for tasks assessed/performed Overall Cognitive Status: Within Functional Limits for tasks assessed                                        General Comments General comments (skin integrity, edema, etc.): VSS on RA    Exercises Other Exercises Other Exercises: PT provides exercise handout for short arc quads, prone hip extension and knee flexion, glute sets   Assessment/Plan    PT Assessment Patient needs continued PT services  PT Problem List Decreased activity tolerance;Decreased balance;Decreased mobility;Decreased knowledge of use of DME;Decreased safety awareness;Decreased knowledge of precautions       PT Treatment Interventions DME instruction;Gait training;Stair training;Functional mobility training;Therapeutic activities;Therapeutic exercise;Balance training;Neuromuscular re-education;Patient/family education;Wheelchair mobility training    PT Goals (Current goals can be found in the Care Plan section)  Acute Rehab PT Goals Patient Stated Goal: to return to independent mobility PT Goal Formulation: With patient Time For Goal Achievement: 10/18/20 Potential to Achieve Goals: Good Additional Goals Additional Goal #1: Pt will mobilize in manual wheelchair for 100' at a modI level    Frequency Min 3X/week   Barriers to discharge        Co-evaluation               AM-PAC PT "6 Clicks" Mobility  Outcome Measure Help needed turning from your back to your side while in a flat bed without using bedrails?: None Help needed moving from lying on your back to sitting on the side of a  flat bed without using bedrails?: A Little Help needed moving to and from a bed to a chair (including a wheelchair)?: A Little Help needed standing up from a chair using your arms (e.g., wheelchair or bedside chair)?: A Little Help needed to walk in hospital room?: A Little Help needed climbing 3-5 steps with a railing? : A Little 6 Click Score: 19    End of Session   Activity Tolerance: Patient tolerated treatment well Patient left: in bed;with call bell/phone within reach;with nursing/sitter in room Nurse Communication: Mobility status PT Visit Diagnosis: Unsteadiness on feet (R26.81);Other abnormalities of gait and mobility (R26.89)    Time: 0321-2248 PT Time Calculation (min) (ACUTE ONLY): 27 min   Charges:   PT Evaluation $PT Eval Low Complexity: 1 Low          Arlyss Gandy, PT, DPT Acute Rehabilitation Pager: [phone]   Arlyss Gandy 10/04/2020, 12:16 PM

## 2020-10-04 NOTE — Progress Notes (Signed)
Mobility Specialist - Progress Note   10/04/20 1510  Mobility  Activity Ambulated in room  Level of Assistance Standby assist, set-up cues, supervision of patient - no hands on  Assistive Device Front wheel walker  Distance Ambulated (ft) 30 ft  Mobility Response Tolerated well  Mobility performed by Mobility specialist  $Mobility charge 1 Mobility   Pt able to stand independently after bed height was raised. He then ambulated to the door and back to his bed. Pt c/o pain at his amputation site after ambulation, RN notified. VSS.   Mamie Levers Mobility Specialist Mobility Specialist Phone: [phone]

## 2020-10-04 NOTE — Evaluation (Signed)
Occupational Therapy Evaluation Patient Details Name: Anthony Park MRN: [id number] DOB: [birth year] Today's Date: 10/04/2020    History of Present Illness 60 y.o. male with PMH significant for peripheral vascular disease status post multiple episodes of osteomyelitis and amputation status post left TMA, DM 2, HTN and recent cessation of alcohol.  Status post left BKA 1/21 secondary to ongoing infection/osteomyelitis and nonhealing of previous left TMA. Routine preop labs noted that patient was COVID-positive.   Clinical Impression   Patient admitted for the above procedure.  PTA he was continuing to use RW for mobility.  Patient states he was unable to bear weight though his L leg prior.  He was able to perform his own self care and toileting without assist.  Currently, he is needing up to Min A for lower body ADL, and Min Guard for basic transfers at [facility] level.  Patient is being considered for CIR, but is unsure if he will wait for covid clearance.  He would be an excellent candidate for CIR, but if he decides to return home, [facility] OT is recommended.      Follow Up Recommendations  Home health OT;CIR    Equipment Recommendations  3 in 1 bedside commode;Wheelchair [phone]");Wheelchair cushion [id number]" gel foam);Tub/shower bench    Recommendations for Other Services       Precautions / Restrictions Precautions Precautions: Fall Restrictions Weight Bearing Restrictions: Yes LLE Weight Bearing: Non weight bearing      Mobility Bed Mobility Overal bed mobility: Modified Independent                  Transfers Overall transfer level: Needs assistance   Transfers: Sit to/from Stand;Stand Pivot Transfers Sit to Stand: Min guard Stand pivot transfers: Min guard            Balance Overall balance assessment: Needs assistance Sitting-balance support: No upper extremity supported Sitting balance-Leahy Scale: Fair     Standing balance support: Bilateral upper extremity  supported Standing balance-Leahy Scale: Poor Standing balance comment: needs RW                           ADL either performed or assessed with clinical judgement   ADL Overall ADL's : Needs assistance/impaired Eating/Feeding: Independent   Grooming: Wash/dry hands;Wash/dry face;Set up;Sitting   Upper Body Bathing: Set up;Sitting   Lower Body Bathing: Minimal assistance;Sit to/from stand   Upper Body Dressing : Set up;Sitting   Lower Body Dressing: Minimal assistance;Sit to/from stand               Functional mobility during ADLs: Min guard;Rolling walker       Vision Baseline Vision/History: Wears glasses Wears Glasses: At all times Patient Visual Report: No change from baseline       Perception     Praxis      Pertinent Vitals/Pain Pain Assessment: Faces Faces Pain Scale: Hurts even more Pain Location: L residual limb Pain Descriptors / Indicators: Tender;Throbbing Pain Intervention(s): Monitored during session     Hand Dominance Right   Extremity/Trunk Assessment Upper Extremity Assessment Upper Extremity Assessment: Overall WFL for tasks assessed   Lower Extremity Assessment Lower Extremity Assessment: Defer to PT evaluation   Cervical / Trunk Assessment Cervical / Trunk Assessment: Normal   Communication     Cognition Arousal/Alertness: Awake/alert Behavior During Therapy: WFL for tasks assessed/performed Overall Cognitive Status: Within Functional Limits for tasks assessed  General Comments   VSS on RA    Exercises     Shoulder Instructions      Home Living Family/patient expects to be discharged to:: Private residence Living Arrangements: Spouse/significant other Available Help at Discharge: Family;Available 24 hours/day Type of Home: House Home Access: Stairs to enter [employer] of Steps: 1 Entrance Stairs-Rails: Left Home Layout: One level      Bathroom Shower/Tub: Producer, television/film/video: Handicapped height Bathroom Accessibility: Yes How Accessible: Accessible via walker Home Equipment: Walker - 2 wheels;Cane - single point;Hand held shower head   Additional Comments: Wife works from home, has flexible job and can assist pt as needed      Prior Functioning/Environment Level of Independence: Independent with assistive device(s)        Comments: Recent remodel to bathroom.  Mod I with mobility and RW.  Was caring for himself without assist.  Spouse assists with community mobility as needed.        OT Problem List: Decreased activity tolerance;Impaired balance (sitting and/or standing);Pain      OT Treatment/Interventions: Self-care/ADL training;Therapeutic exercise;DME and/or AE instruction;Balance training;Therapeutic activities    OT Goals(Current goals can be found in the care plan section) Acute Rehab OT Goals Patient Stated Goal: Just figure out how to move better OT Goal Formulation: With patient Time For Goal Achievement: 10/18/20 Potential to Achieve Goals: Good  OT Frequency: Min 2X/week   Barriers to D/C:    none noted       Co-evaluation              AM-PAC OT "6 Clicks" Daily Activity     Outcome Measure Help from another person eating meals?: None Help from another person taking care of personal grooming?: A Little Help from another person toileting, which includes using toliet, bedpan, or urinal?: A Little Help from another person bathing (including washing, rinsing, drying)?: A Little Help from another person to put on and taking off regular upper body clothing?: None Help from another person to put on and taking off regular lower body clothing?: A Little 6 Click Score: 20   End of Session Equipment Utilized During Treatment: Rolling walker Nurse Communication: Mobility status  Activity Tolerance: Patient tolerated treatment well Patient left: in chair;with call bell/phone  within reach;with nursing/sitter in room  OT Visit Diagnosis: Unsteadiness on feet (R26.81);Pain Pain - Right/Left: Left Pain - part of body: Leg                Time: 6387-5643 OT Time Calculation (min): 22 min Charges:  OT General Charges $OT Visit: 1 Visit OT Evaluation $OT Eval Moderate Complexity: 1 Mod  10/04/2020  Rich, OTR/L  Acute Rehabilitation Services  Office:  [phone]   Suzanna Obey 10/04/2020, 9:37 AM

## 2020-10-04 NOTE — Progress Notes (Signed)
[facility] PROGRESS NOTE   KHARTER BREW VVZ:[id number] DOB: Oct 27, 1960 DOA: 10/03/2020  PCP: Patient, No Pcp Per  Brief History/Interval Summary: 60 y.o. male with PMH significant for peripheral vascular disease status post multiple episodes of osteomyelitis and amputation status post left TMA, DM 2, HTN and recent cessation of alcohol was admitted secondary to ongoing infection/osteomyelitis and nonhealing of previous left TMA. Routine preop labs noted that patient was COVID-positive.  He was noted to be asymptomatic at the time.  Reason for Visit: Nonhealing left transmetatarsal amputation site  Consultants: Vascular surgery  Procedures: Left below-knee amputation  Antibiotics: Anti-infectives (From admission, onward)   Start     Dose/Rate Route Frequency Ordered Stop   10/03/20 1800  ceFAZolin (ANCEF) IVPB 2g/100 mL premix        2 g 200 mL/hr over 30 Minutes Intravenous Every 8 hours 10/03/20 1308 10/04/20 0427   10/03/20 0557  ceFAZolin (ANCEF) IVPB 2g/100 mL premix        2 g 200 mL/hr over 30 Minutes Intravenous 30 min pre-op 10/03/20 0557 10/03/20 1027      Subjective/Interval History: Patient denies any respiratory symptoms whatsoever.  He mentions that he has a slight throat irritation but nothing beyond that.  Pain in the left lower extremity is 6 out of 10 in intensity.    Assessment/Plan:  Nonhealing left TMA status post BKA Vascular surgery is following.  Pain control.  Dressing change to be done tomorrow per vascular surgery note.  COVID-19 virus infection Patient is asymptomatic without any oxygen requirement.  He does not know of any known exposure.  He had tested negative for COVID end of December.  No one is sick in his household.  Unclear when he was exposed and when he contracted the virus.  Not truly a candidate for Remdesivir at this time.  Chest x-ray showed atelectasis.  CRP was only 3.8.  Procalcitonin was less than 0.1.  D-dimer 2.18.   Elevated D-dimer could be due to COVID-19 as well as his other acute illness.  No indication for further work-up at this time.  Normocytic anemia/folic acid deficiency Apparently has had a drop in his hemoglobin since December.  No overt bleeding noted.  Slightly lower today compared to yesterday.  Could be due to operative losses.  We will recheck tomorrow.  Transfuse for hemoglobin less than 7.  Anemia panel does show folic acid deficiency.  Ferritin 85, iron 30 TIBC 385. We will start repleting folic acid.  Peripheral vascular disease Continue aspirin and Plavix.  Continue statin.  Diabetes mellitus type 2, controlled On metformin at home.  Continue SSI.  HbA1c 6.2.  Essential hypertension Blood pressure noted to be elevated.  Likely due to pain issues.  Currently noted to be on lisinopril which is his home medication.  Obesity Estimated body mass index is 31.19 kg/m as calculated from the following:   Height as of this encounter: 6' (1.829 m).   Weight as of this encounter: 104.3 kg.    DVT Prophylaxis: Subcutaneous heparin Code Status: Full code Family Communication: Discussed with the patient Disposition Plan: Hopefully return home when improved.  Status is: Inpatient  Remains inpatient appropriate because:Ongoing active pain requiring inpatient pain management, IV treatments appropriate due to intensity of illness or inability to take PO and Inpatient level of care appropriate due to severity of illness   Dispo: The patient is from: Home              Anticipated  d/c is to: Home              Anticipated d/c date is: 2 days              Patient currently is not medically stable to d/c.   Difficult to place patient No    Medications:  Scheduled: . aspirin EC  81 mg Oral Daily  . atorvastatin  10 mg Oral Daily  . Chlorhexidine Gluconate Cloth  6 each Topical Daily  . Chlorhexidine Gluconate Cloth  6 each Topical Daily  . clopidogrel  75 mg Oral Daily  . docusate  sodium  100 mg Oral Daily  . folic acid  1 mg Oral Daily  . heparin  5,000 Units Subcutaneous Q8H  . insulin aspart  0-6 Units Subcutaneous TID WC  . lisinopril  20 mg Oral Daily   Continuous: . sodium chloride 75 mL/hr at 10/03/20 2204   WEX:HBZJIRCVELFYB **OR** acetaminophen, bisacodyl, guaiFENesin-dextromethorphan, magnesium citrate, methocarbamol, morphine injection, nicotine, ondansetron, oxyCODONE-acetaminophen, phenol, polyethylene glycol   Objective:  Vital Signs  Vitals:   10/04/20 0400 10/04/20 0526 10/04/20 0800 10/04/20 0829  BP: (!) 169/90  (!) 159/100 (!) 159/100  Pulse: 78  70   Resp: 15 12 15    Temp: 99 F (37.2 C)  99 F (37.2 C)   TempSrc: Oral  Oral   SpO2: 96% 97% 96%   Weight:      Height:        Intake/Output Summary (Last 24 hours) at 10/04/2020 1305 Last data filed at 10/04/2020 0900 Gross per 24 hour  Intake 720 ml  Output 2050 ml  Net -1330 ml   Filed Weights   10/01/20 1318 10/03/20 0545  Weight: 104.3 kg 104.3 kg    General appearance: Awake alert.  In no distress Resp: Clear to auscultation bilaterally.  Normal effort Cardio: S1-S2 is normal regular.  No S3-S4.  No rubs murmurs or bruit GI: Abdomen is soft.  Nontender nondistended.  Bowel sounds are present normal.  No masses organomegaly Extremities: No edema.  Left leg stump covered in dressing Neurologic: Alert and oriented x3.  No focal neurological deficits.    Lab Results:  Data Reviewed: I have personally reviewed following labs and imaging studies  CBC: Recent Labs  Lab 10/03/20 0630 10/04/20 0147  WBC 9.9 11.1*  HGB 9.3* 8.5*  HCT 30.3* 25.7*  MCV 91.8 89.5  PLT 290 273    Basic Metabolic Panel: Recent Labs  Lab 10/03/20 0630 10/04/20 0147  NA 140 135  K 4.4 4.1  CL 108 102  CO2 20* 22  GLUCOSE 127* 191*  BUN 8 11  CREATININE 0.58* 0.71  CALCIUM 9.1 8.4*    GFR: Estimated Creatinine Clearance: 124.2 mL/min (by C-G formula based on SCr of 0.71  mg/dL).  Liver Function Tests: Recent Labs  Lab 10/03/20 0630  AST 26  ALT 15  ALKPHOS 46  BILITOT 1.0  PROT 6.6  ALBUMIN 3.4*     Coagulation Profile: Recent Labs  Lab 10/03/20 0630  INR 1.1    HbA1C: Recent Labs    10/03/20 0630  HGBA1C 6.2*    CBG: Recent Labs  Lab 10/03/20 1142 10/03/20 1629 10/03/20 2126 10/04/20 0633 10/04/20 1147  GLUCAP 133* 182* 219* 154* 128*     Thyroid Function Tests: Recent Labs    10/04/20 0147  TSH 0.431    Anemia Panel: Recent Labs    10/04/20 0147  VITAMINB12 1,080*  FOLATE 4.6*  FERRITIN  85  TIBC 385  IRON 30*    Recent Results (from the past 240 hour(s))  SARS CORONAVIRUS 2 (TAT 6-24 HRS) Nasopharyngeal Nasopharyngeal Swab     Status: Abnormal   Collection Time: 10/02/20  8:29 AM   Specimen: Nasopharyngeal Swab  Result Value Ref Range Status   SARS Coronavirus 2 POSITIVE (A) NEGATIVE Final    Comment: (NOTE) SARS-CoV-2 target nucleic acids are DETECTED.  The SARS-CoV-2 RNA is generally detectable in upper and lower respiratory specimens during the acute phase of infection. Positive results are indicative of the presence of SARS-CoV-2 RNA. Clinical correlation with patient history and other diagnostic information is  necessary to determine patient infection status. Positive results do not rule out bacterial infection or co-infection with other viruses.  The expected result is Negative.  Fact Sheet for Patients: HairSlick.no  Fact Sheet for Healthcare Providers: quierodirigir.com  This test is not yet approved or cleared by the [place] FDA and  has been authorized for detection and/or diagnosis of SARS-CoV-2 by FDA under an Emergency Use Authorization (EUA). This EUA will remain  in effect (meaning this test can be used) for the duration of the COVID-19 declaration under Section 564(b)(1) of the Act, 21 U. S.C. section 360bbb-3(b)(1),  unless the authorization is terminated or revoked sooner.   Performed at [facility], [address], [place], [place] [address]   Surgical pcr screen     Status: None   Collection Time: 10/03/20  6:35 AM   Specimen: Nasal Mucosa; Nasal Swab  Result Value Ref Range Status   MRSA, PCR NEGATIVE NEGATIVE Final   Staphylococcus aureus NEGATIVE NEGATIVE Final    Comment: (NOTE) The Xpert SA Assay (FDA approved for NASAL specimens in patients 89 years of age and older), is one component of a comprehensive surveillance program. It is not intended to diagnose infection nor to guide or monitor treatment. Performed at [facility], [address], [place], [place] [address]       Radiology Studies: DG Chest Port 1 View  Result Date: 10/03/2020 CLINICAL DATA:  Reported COVID-19 positive. EXAM: PORTABLE CHEST 1 VIEW COMPARISON:  None. FINDINGS: A small portion of the lateral left base is not included on this study. There is minimal right base atelectasis. Visualized lungs otherwise clear. Heart is upper normal in size with pulmonary vascularity normal. No adenopathy. Central catheter tip in superior vena cava. No pneumothorax. IMPRESSION: Minimal right base atelectasis. Visualized lungs otherwise clear. Note that a small portion of the lateral left base is not included on this study. Heart upper normal in size. Central catheter tip in superior vena cava. Electronically Signed   By: Bretta Bang III M.D.   On: 10/03/2020 13:59       LOS: 1 day   Ettel Albergo  [facility] Pager on [email or web address]  10/04/2020, 1:05 PM

## 2020-10-04 NOTE — Progress Notes (Signed)
   VASCULAR SURGERY ASSESSMENT & PLAN:   POD 1 LEFT BKA: His dressing is dry.  We will plan on a dressing change tomorrow.  His pain is under good control.   SUBJECTIVE:   His pain is under adequate control.  PHYSICAL EXAM:   Vitals:   10/04/20 0011 10/04/20 0351 10/04/20 0400 10/04/20 0526  BP: (!) 153/93  (!) 169/90   Pulse: 87  78   Resp: 17 17 15 12   Temp: 98.4 F (36.9 C)  99 F (37.2 C)   TempSrc: Oral  Oral   SpO2: 95% 97% 96% 97%  Weight:      Height:       His dressing on the left BKA is dry.  LABS:   Lab Results  Component Value Date   WBC 11.1 (H) 10/04/2020   HGB 8.5 (L) 10/04/2020   HCT 25.7 (L) 10/04/2020   MCV 89.5 10/04/2020   PLT 273 10/04/2020   CBG (last 3)  Recent Labs    10/03/20 1629 10/03/20 2126 10/04/20 0633  GLUCAP 182* 219* 154*    PROBLEM LIST:    Principal Problem:   S/P BKA (below knee amputation) unilateral, left (HCC) Active Problems:   Type 2 diabetes mellitus with foot ulcer, without long-term current use of insulin (HCC)   Essential hypertension   Mixed hyperlipidemia   Morbid obesity (HCC)   PAD (peripheral artery disease) (HCC)   History of amputation of left foot through metatarsal bone (HCC)   Non-healing wound of lower extremity   CURRENT MEDS:   . aspirin EC  81 mg Oral Daily  . atorvastatin  10 mg Oral Daily  . Chlorhexidine Gluconate Cloth  6 each Topical Daily  . Chlorhexidine Gluconate Cloth  6 each Topical Daily  . clopidogrel  75 mg Oral Daily  . docusate sodium  100 mg Oral Daily  . folic acid  1 mg Oral Daily  . heparin  5,000 Units Subcutaneous Q8H  . insulin aspart  0-6 Units Subcutaneous TID WC  . lisinopril  20 mg Oral Daily    10/06/20 Office: [phone] 10/04/2020

## 2020-10-05 LAB — GLUCOSE, CAPILLARY
Glucose-Capillary: 110 mg/dL — ABNORMAL HIGH (ref 70–99)
Glucose-Capillary: 134 mg/dL — ABNORMAL HIGH (ref 70–99)
Glucose-Capillary: 113 mg/dL — ABNORMAL HIGH (ref 70–99)
Glucose-Capillary: 120 mg/dL — ABNORMAL HIGH (ref 70–99)

## 2020-10-05 LAB — CBC
HCT: 25.4 % — ABNORMAL LOW (ref 39.0–52.0)
Hemoglobin: 8.1 g/dL — ABNORMAL LOW (ref 13.0–17.0)
MCH: 28.9 pg (ref 26.0–34.0)
MCHC: 31.9 g/dL (ref 30.0–36.0)
MCV: 90.7 fL (ref 80.0–100.0)
Platelets: 262 10*3/uL (ref 150–400)
RBC: 2.8 MIL/uL — ABNORMAL LOW (ref 4.22–5.81)
RDW: 16.5 % — ABNORMAL HIGH (ref 11.5–15.5)
WBC: 8 10*3/uL (ref 4.0–10.5)
nRBC: 0.2 % (ref 0.0–0.2)

## 2020-10-05 LAB — BASIC METABOLIC PANEL
Anion gap: 9 (ref 5–15)
BUN: 11 mg/dL (ref 6–20)
CO2: 26 mmol/L (ref 22–32)
Calcium: 8.7 mg/dL — ABNORMAL LOW (ref 8.9–10.3)
Chloride: 103 mmol/L (ref 98–111)
Creatinine, Ser: 0.72 mg/dL (ref 0.61–1.24)
GFR, Estimated: >60 mL/min (ref >60)
Glucose, Bld: 111 mg/dL — ABNORMAL HIGH (ref 70–99)
Potassium: 3.8 mmol/L (ref 3.5–5.1)
Sodium: 138 mmol/L (ref 135–145)

## 2020-10-05 NOTE — Progress Notes (Signed)
[facility] PROGRESS NOTE   Anthony Park EBR:[id number] DOB: 1960-10-15 DOA: 10/03/2020  PCP: Patient, No Pcp Per  Brief History/Interval Summary: 60 y.o. male with PMH significant for peripheral vascular disease status post multiple episodes of osteomyelitis and amputation status post left TMA, DM 2, HTN and recent cessation of alcohol was admitted secondary to ongoing infection/osteomyelitis and nonhealing of previous left TMA. Routine preop labs noted that patient was COVID-positive.  He was noted to be asymptomatic at the time.  Reason for Visit: Nonhealing left transmetatarsal amputation site  Consultants: Vascular surgery  Procedures: Left below-knee amputation  Antibiotics: Anti-infectives (From admission, onward)   Start     Dose/Rate Route Frequency Ordered Stop   10/03/20 1800  ceFAZolin (ANCEF) IVPB 2g/100 mL premix        2 g 200 mL/hr over 30 Minutes Intravenous Every 8 hours 10/03/20 1308 10/04/20 0427   10/03/20 0557  ceFAZolin (ANCEF) IVPB 2g/100 mL premix        2 g 200 mL/hr over 30 Minutes Intravenous 30 min pre-op 10/03/20 0557 10/03/20 1027      Subjective/Interval History: Patient continues to feel better. Pain is adequately controlled. Denies any respiratory symptoms. No nausea or vomiting.     Assessment/Plan:  Nonhealing left TMA status post BKA Vascular surgery is following.  Pain control. PT and OT.  COVID-19 virus infection Patient is asymptomatic without any oxygen requirement.  He does not know of any known exposure.  He had tested negative for COVID end of December.  No one is sick in his household.  Unclear when he was exposed and when he contracted the virus.  Not a candidate for Remdesivir at this time.  Chest x-ray showed atelectasis.  CRP was only 3.8.  Procalcitonin was less than 0.1.  D-dimer 2.18.  Elevated D-dimer could be due to COVID-19 as well as his other acute illness.  No indication for further work-up at this  time.  Normocytic anemia/folic acid deficiency Apparently has had a drop in his hemoglobin since December.  No overt bleeding noted. Could be due to operative losses. Hemoglobin low but stable this morning. Transfuse for hemoglobin less than 7.  Anemia panel does show folic acid deficiency.  Ferritin 85, iron 30 TIBC 385. We will start repleting folic acid.  Peripheral vascular disease Continue aspirin and Plavix.  Continue statin.  Diabetes mellitus type 2, controlled On metformin at home.  Continue SSI.  HbA1c 6.2. CBGs are reasonably well controlled.  Essential hypertension Occasional high readings noted likely due to pain issues. Currently noted to be on lisinopril which is his home medication.  Obesity Estimated body mass index is 31.19 kg/m as calculated from the following:   Height as of this encounter: 6' (1.829 m).   Weight as of this encounter: 104.3 kg.    DVT Prophylaxis: Subcutaneous heparin Code Status: Full code Family Communication: Discussed with the patient Disposition Plan: Hopefully return home when improved. PT has recommended home health. Patient willing to go home. He states that his wife can assist him at home.  Status is: Inpatient  Remains inpatient appropriate because:Ongoing active pain requiring inpatient pain management, IV treatments appropriate due to intensity of illness or inability to take PO and Inpatient level of care appropriate due to severity of illness   Dispo: The patient is from: Home              Anticipated d/c is to: Home  Anticipated d/c date is: 1/24              Patient currently is not medically stable to d/c.   Difficult to place patient No    Medications:  Scheduled: . aspirin EC  81 mg Oral Daily  . atorvastatin  10 mg Oral Daily  . Chlorhexidine Gluconate Cloth  6 each Topical Daily  . Chlorhexidine Gluconate Cloth  6 each Topical Daily  . clopidogrel  75 mg Oral Daily  . docusate sodium  100 mg Oral  Daily  . folic acid  1 mg Oral Daily  . heparin  5,000 Units Subcutaneous Q8H  . insulin aspart  0-6 Units Subcutaneous TID WC  . lisinopril  20 mg Oral Daily   Continuous: . sodium chloride Stopped (10/04/20 2100)   TDD:[id number] **OR** acetaminophen, bisacodyl, guaiFENesin-dextromethorphan, magnesium citrate, methocarbamol, morphine injection, nicotine, ondansetron, oxyCODONE-acetaminophen, phenol, polyethylene glycol   Objective:  Vital Signs  Vitals:   10/04/20 2002 10/04/20 2304 10/05/20 0552 10/05/20 0700  BP: 121/76 130/88 (!) 165/109 (!) 158/85  Pulse: 82 75  60  Resp: 19 18  16   Temp: 98.7 F (37.1 C) 98.2 F (36.8 C) 99.1 F (37.3 C) 98.5 F (36.9 C)  TempSrc: Oral Oral Oral Oral  SpO2: 98% 98% 97% 97%  Weight:      Height:        Intake/Output Summary (Last 24 hours) at 10/05/2020 1044 Last data filed at 10/05/2020 0913 Gross per 24 hour  Intake 870 ml  Output 2100 ml  Net -1230 ml   Filed Weights   10/01/20 1318 10/03/20 0545  Weight: 104.3 kg 104.3 kg    General appearance: Awake alert.  In no distress Resp: Clear to auscultation bilaterally.  Normal effort Cardio: S1-S2 is normal regular.  No S3-S4.  No rubs murmurs or bruit GI: Abdomen is soft.  Nontender nondistended.  Bowel sounds are present normal.  No masses organomegaly Extremities: No edema.   Neurologic: Alert and oriented x3.  No focal neurological deficits.     Lab Results:  Data Reviewed: I have personally reviewed following labs and imaging studies  CBC: Recent Labs  Lab 10/03/20 0630 10/04/20 0147 10/05/20 0614  WBC 9.9 11.1* 8.0  HGB 9.3* 8.5* 8.1*  HCT 30.3* 25.7* 25.4*  MCV 91.8 89.5 90.7  PLT 290 273 262    Basic Metabolic Panel: Recent Labs  Lab 10/03/20 0630 10/04/20 0147 10/05/20 0614  NA 140 135 138  K 4.4 4.1 3.8  CL 108 102 103  CO2 20* 22 26  GLUCOSE 127* 191* 111*  BUN 8 11 11   CREATININE 0.58* 0.71 0.72  CALCIUM 9.1 8.4* 8.7*     GFR: Estimated Creatinine Clearance: 124.2 mL/min (by C-G formula based on SCr of 0.72 mg/dL).  Liver Function Tests: Recent Labs  Lab 10/03/20 0630  AST 26  ALT 15  ALKPHOS 46  BILITOT 1.0  PROT 6.6  ALBUMIN 3.4*     Coagulation Profile: Recent Labs  Lab 10/03/20 0630  INR 1.1    HbA1C: Recent Labs    10/03/20 0630  HGBA1C 6.2*    CBG: Recent Labs  Lab 10/04/20 0633 10/04/20 1147 10/04/20 1612 10/04/20 2209 10/05/20 0558  GLUCAP 154* 128* 185* 149* 110*     Thyroid Function Tests: Recent Labs    10/04/20 0147  TSH 0.431    Anemia Panel: Recent Labs    10/04/20 0147  VITAMINB12 1,080*  FOLATE 4.6*  FERRITIN 85  TIBC 385  IRON 30*    Recent Results (from the past 240 hour(s))  SARS CORONAVIRUS 2 (TAT 6-24 HRS) Nasopharyngeal Nasopharyngeal Swab     Status: Abnormal   Collection Time: 10/02/20  8:29 AM   Specimen: Nasopharyngeal Swab  Result Value Ref Range Status   SARS Coronavirus 2 POSITIVE (A) NEGATIVE Final    Comment: (NOTE) SARS-CoV-2 target nucleic acids are DETECTED.  The SARS-CoV-2 RNA is generally detectable in upper and lower respiratory specimens during the acute phase of infection. Positive results are indicative of the presence of SARS-CoV-2 RNA. Clinical correlation with patient history and other diagnostic information is  necessary to determine patient infection status. Positive results do not rule out bacterial infection or co-infection with other viruses.  The expected result is Negative.  Fact Sheet for Patients: HairSlick.no  Fact Sheet for Healthcare Providers: quierodirigir.com  This test is not yet approved or cleared by the [place] FDA and  has been authorized for detection and/or diagnosis of SARS-CoV-2 by FDA under an Emergency Use Authorization (EUA). This EUA will remain  in effect (meaning this test can be used) for the duration of  the COVID-19 declaration under Section 564(b)(1) of the Act, 21 U. S.C. section 360bbb-3(b)(1), unless the authorization is terminated or revoked sooner.   Performed at [facility], [address], [place], [place] [address]   Surgical pcr screen     Status: None   Collection Time: 10/03/20  6:35 AM   Specimen: Nasal Mucosa; Nasal Swab  Result Value Ref Range Status   MRSA, PCR NEGATIVE NEGATIVE Final   Staphylococcus aureus NEGATIVE NEGATIVE Final    Comment: (NOTE) The Xpert SA Assay (FDA approved for NASAL specimens in patients 39 years of age and older), is one component of a comprehensive surveillance program. It is not intended to diagnose infection nor to guide or monitor treatment. Performed at [facility], [address], [place], [place] [address]       Radiology Studies: DG Chest Port 1 View  Result Date: 10/03/2020 CLINICAL DATA:  Reported COVID-19 positive. EXAM: PORTABLE CHEST 1 VIEW COMPARISON:  None. FINDINGS: A small portion of the lateral left base is not included on this study. There is minimal right base atelectasis. Visualized lungs otherwise clear. Heart is upper normal in size with pulmonary vascularity normal. No adenopathy. Central catheter tip in superior vena cava. No pneumothorax. IMPRESSION: Minimal right base atelectasis. Visualized lungs otherwise clear. Note that a small portion of the lateral left base is not included on this study. Heart upper normal in size. Central catheter tip in superior vena cava. Electronically Signed   By: Bretta Bang III M.D.   On: 10/03/2020 13:59       LOS: 2 days   Amanee Iacovelli Rito Ehrlich  [facility] Pager on [email or web address]  10/05/2020, 10:44 AM

## 2020-10-05 NOTE — Progress Notes (Addendum)
  Progress Note    10/05/2020 8:00 AM 2 Days Post-Op  Subjective:  No complaints   Vitals:   10/04/20 2304 10/05/20 0552  BP: 130/88 (!) 165/109  Pulse: 75   Resp: 18   Temp: 98.2 F (36.8 C) 99.1 F (37.3 C)  SpO2: 98% 97%    Physical Exam: Incisions:  L BKA incision c/d/i   CBC    Component Value Date/Time   WBC 8.0 10/05/2020 0614   RBC 2.80 (L) 10/05/2020 0614   HGB 8.1 (L) 10/05/2020 0614   HCT 25.4 (L) 10/05/2020 0614   PLT 262 10/05/2020 0614   MCV 90.7 10/05/2020 0614   MCH 28.9 10/05/2020 0614   MCHC 31.9 10/05/2020 0614   RDW 16.5 (H) 10/05/2020 0614   LYMPHSABS 2.3 08/03/2020 1648   MONOABS 0.9 08/03/2020 1648   EOSABS 0.0 08/03/2020 1648   BASOSABS 0.1 08/03/2020 1648    BMET    Component Value Date/Time   NA 135 10/04/2020 0147   K 4.1 10/04/2020 0147   CL 102 10/04/2020 0147   CO2 22 10/04/2020 0147   GLUCOSE 191 (H) 10/04/2020 0147   BUN 11 10/04/2020 0147   BUN 14 02/13/2014 1044   CREATININE 0.71 10/04/2020 0147   CREATININE 0.75 09/23/2020 1058   CALCIUM 8.4 (L) 10/04/2020 0147   GFRNONAA >60 10/04/2020 0147   GFRNONAA >60 02/13/2014 1044   GFRAA >60 05/20/2020 0933   GFRAA >60 02/13/2014 1044    INR    Component Value Date/Time   INR 1.1 10/03/2020 0630     Intake/Output Summary (Last 24 hours) at 10/05/2020 0800 Last data filed at 10/05/2020 0558 Gross per 24 hour  Intake 1110 ml  Output 2300 ml  Net -1190 ml     Assessment/Plan:  60 y.o. male is s/p left below knee amputation  2 Days Post-Op  - Dressing changed; incision is unremarkable; continue dressing changes daily - PT/OT eval - Staples out in 4-6 weeks  Emilie Rutter, PA-C [facility] [phone] 10/05/2020 8:00 AM  I have interviewed the patient and examined the patient. I agree with the findings by the PA.  He has done very well with physical therapy.  If he can get off the IV morphine he could potentially even go home tomorrow  from our standpoint with home health physical therapy.  Cari Caraway, MD [phone]

## 2020-10-05 NOTE — Progress Notes (Signed)
Mobility Specialist - Progress Note   10/05/20 1514  Mobility  Activity Ambulated in room  Level of Assistance Standby assist, set-up cues, supervision of patient - no hands on  Assistive Device Front wheel walker  Distance Ambulated (ft) 30 ft  Mobility Response Tolerated well  Mobility performed by Mobility specialist  $Mobility charge 1 Mobility   Pre-mobility: 81 HR During mobility: 104 HR Post-mobility: 90 HR  Asx throughout ambulation. Pt sitting up on edge of bed after walk.   Mamie Levers Mobility Specialist Mobility Specialist Phone: [phone]

## 2020-10-05 NOTE — Progress Notes (Signed)
Physical Therapy Treatment Patient Details Name: Anthony Park MRN: [id number] DOB: 14-Nov-1960 Today's Date: 10/05/2020    History of Present Illness 60 y.o. male with PMH significant for peripheral vascular disease status post multiple episodes of osteomyelitis and amputation status post left TMA, DM 2, HTN and recent cessation of alcohol was admitted today after left BKA (10/03/2020) secondary to ongoing infection/osteomyelitis and nonhealing of previous left TMA.    PT Comments    Continuing work on functional mobility and activity tolerance;  Pt had recently settled back into bed after a Mobility specialist session, so focused on education and therex to perform in prep for prosthesis (added the exercises to his current home program sheets); overall good progress with mobility, and pt's wife has had a ramp installed, so he does not anticipate any difficulty getting home   Follow Up Recommendations  Home health PT;Supervision for mobility/OOB     Equipment Recommendations  Wheelchair (measurements PT);Wheelchair cushion (measurements PT)    Recommendations for Other Services       Precautions / Restrictions Precautions Precautions: Fall Restrictions LLE Weight Bearing: Non weight bearing    Mobility  Bed Mobility Overal bed mobility: Modified Independent             General bed mobility comments: sit to supine modI  Transfers Overall transfer level: Needs assistance Equipment used: Rolling walker (2 wheeled) Transfers: Sit to/from [employer] Sit to Stand: Min guard Stand pivot transfers: Supervision       General transfer comment: PT cues for hand placement to improve balance in sit to stand  Ambulation/Gait Ambulation/Gait assistance: Min guard Gait Distance (Feet): 20 Feet (10+10 to and from bathroom) Assistive device: Rolling walker (2 wheeled) Gait Pattern/deviations:  (Hop to)     General Gait Details: pt with short hops on RLE, no LOB  noted   Stairs             Wheelchair Mobility    Modified Rankin (Stroke Patients Only)       Balance     Sitting balance-Leahy Scale: Good       Standing balance-Leahy Scale: Poor                              Cognition Arousal/Alertness: Awake/alert Behavior During Therapy: WFL for tasks assessed/performed Overall Cognitive Status: Within Functional Limits for tasks assessed                                        Exercises Other Exercises Other Exercises: Bolstered bridging x10 Other Exercises: L hamstring stretch x3 Other Exercises: sidelying hip abduction x10 Other Exercises: sidelying hip extension x10 Other Exercises: isometric hip abduction with belt x10    General Comments General comments (skin integrity, edema, etc.): Discussed interventions to reduce phantom pain and sensation, and exercises for hip flexor stretching, hamstring stretching, hip extensor strengthening in prep for prosthesis; pt had a BM in the bathroom, RN aware      Pertinent Vitals/Pain Pain Assessment: 0-10 Pain Score: 7  Pain Location: L residual limb Pain Descriptors / Indicators: Throbbing Pain Intervention(s): Monitored during session;Patient requesting pain meds-RN notified    Home Living                      Prior Function  PT Goals (current goals can now be found in the care plan section) Acute Rehab PT Goals Patient Stated Goal: to return to independent mobility PT Goal Formulation: With patient Time For Goal Achievement: 10/18/20 Potential to Achieve Goals: Good Progress towards PT goals: Progressing toward goals    Frequency    Min 3X/week      PT Plan Current plan remains appropriate    Co-evaluation              AM-PAC PT "6 Clicks" Mobility   Outcome Measure  Help needed turning from your back to your side while in a flat bed without using bedrails?: None Help needed moving from lying on  your back to sitting on the side of a flat bed without using bedrails?: None Help needed moving to and from a bed to a chair (including a wheelchair)?: None Help needed standing up from a chair using your arms (e.g., wheelchair or bedside chair)?: A Little Help needed to walk in hospital room?: A Little Help needed climbing 3-5 steps with a railing? : A Lot 6 Click Score: 20    End of Session Equipment Utilized During Treatment: Gait belt Activity Tolerance: Patient tolerated treatment well Patient left: in bed;with call bell/phone within reach;with nursing/sitter in room Nurse Communication: Mobility status PT Visit Diagnosis: Unsteadiness on feet (R26.81);Other abnormalities of gait and mobility (R26.89)     Time: 1600-1630 PT Time Calculation (min) (ACUTE ONLY): 30 min  Charges:  $Gait Training: 8-22 mins $Therapeutic Exercise: 8-22 mins                     Van Clines, PT  Acute Rehabilitation Services Pager [phone] Office [phone]    Levi Aland 10/05/2020, 7:55 PM

## 2020-10-06 LAB — BASIC METABOLIC PANEL
Anion gap: 9 (ref 5–15)
BUN: 10 mg/dL (ref 6–20)
CO2: 26 mmol/L (ref 22–32)
Calcium: 8.8 mg/dL — ABNORMAL LOW (ref 8.9–10.3)
Chloride: 102 mmol/L (ref 98–111)
Creatinine, Ser: 0.65 mg/dL (ref 0.61–1.24)
GFR, Estimated: >60 mL/min (ref >60)
Glucose, Bld: 127 mg/dL — ABNORMAL HIGH (ref 70–99)
Potassium: 3.8 mmol/L (ref 3.5–5.1)
Sodium: 137 mmol/L (ref 135–145)

## 2020-10-06 LAB — GLUCOSE, CAPILLARY
Glucose-Capillary: 128 mg/dL — ABNORMAL HIGH (ref 70–99)
Glucose-Capillary: 200 mg/dL — ABNORMAL HIGH (ref 70–99)

## 2020-10-06 LAB — SURGICAL PATHOLOGY

## 2020-10-06 LAB — CBC
HCT: 26.1 % — ABNORMAL LOW (ref 39.0–52.0)
Hemoglobin: 8.1 g/dL — ABNORMAL LOW (ref 13.0–17.0)
MCH: 28.6 pg (ref 26.0–34.0)
MCHC: 31 g/dL (ref 30.0–36.0)
MCV: 92.2 fL (ref 80.0–100.0)
Platelets: 254 10*3/uL (ref 150–400)
RBC: 2.83 MIL/uL — ABNORMAL LOW (ref 4.22–5.81)
RDW: 16.2 % — ABNORMAL HIGH (ref 11.5–15.5)
WBC: 7.4 10*3/uL (ref 4.0–10.5)
nRBC: 0 % (ref 0.0–0.2)

## 2020-10-06 MED ORDER — CYCLOBENZAPRINE HCL 5 MG PO TABS: 5.0000 mg | ORAL_TABLET | Freq: Three times a day (TID) | ORAL | 0 refills | Status: DC | PRN

## 2020-10-06 MED ORDER — SENNA 8.6 MG PO TABS: 2.0000 | ORAL_TABLET | Freq: Every day | ORAL | 0 refills | Status: DC

## 2020-10-06 MED ORDER — HYDRALAZINE HCL 20 MG/ML IJ SOLN
10.0000 mg | INTRAMUSCULAR | Status: DC | PRN
  Administered 2020-10-06: 10 mg via INTRAVENOUS
  Filled 2020-10-06: qty 1

## 2020-10-06 MED ORDER — FOLIC ACID 1 MG PO TABS: 1.0000 mg | ORAL_TABLET | Freq: Every day | ORAL | 1 refills | Status: DC

## 2020-10-06 MED ORDER — METHOCARBAMOL 500 MG PO TABS: 500.0000 mg | ORAL_TABLET | Freq: Four times a day (QID) | ORAL | 0 refills | Status: DC | PRN

## 2020-10-06 MED ORDER — LISINOPRIL 40 MG PO TABS: 40.0000 mg | ORAL_TABLET | Freq: Every day | ORAL | 1 refills | Status: DC

## 2020-10-06 MED ORDER — OXYCODONE-ACETAMINOPHEN 5-325 MG PO TABS: 1.0000 | ORAL_TABLET | ORAL | 0 refills | Status: DC | PRN

## 2020-10-06 MED ORDER — LISINOPRIL 40 MG PO TABS
40.0000 mg | ORAL_TABLET | Freq: Every day | ORAL | Status: DC
  Administered 2020-10-06: 40 mg via ORAL
  Filled 2020-10-06: qty 1

## 2020-10-06 MED ORDER — POLYETHYLENE GLYCOL 3350 17 G PO PACK: 17.0000 g | PACK | Freq: Every day | ORAL | 0 refills | Status: DC | PRN

## 2020-10-06 NOTE — Discharge Instructions (Signed)

## 2020-10-06 NOTE — Progress Notes (Signed)
   10/06/20 0400  Vitals  Temp 98.7 F (37.1 C)  Temp Source Oral  BP (!) 182/90  BP Location Left Arm  BP Method Automatic  Patient Position (if appropriate) Lying  ECG Heart Rate 76  Resp 20   BP elevated see above, Dr Dorcas Mcmurray paged new orders received for hydralazine prn, given

## 2020-10-06 NOTE — Progress Notes (Signed)
Physical Therapy Treatment Patient Details Name: Anthony Park MRN: [id number] DOB: 05-07-1961 Today's Date: 10/06/2020    History of Present Illness 60 y.o. male with PMH significant for peripheral vascular disease status post multiple episodes of osteomyelitis and amputation status post left TMA, DM 2, HTN and recent cessation of alcohol was admitted today after left BKA (10/03/2020) secondary to ongoing infection/osteomyelitis and nonhealing of previous left TMA.    PT Comments    Pt required min guard assist transfers with RW. Performed LE exercises in bed and reviewed full HEP. Reviewed interventions to help with reducing phantom pain. Pt reports no concerns regarding mobility at home.   Follow Up Recommendations  Home health PT;Supervision for mobility/OOB     Equipment Recommendations  Wheelchair (measurements PT);Wheelchair cushion (measurements PT)    Recommendations for Other Services       Precautions / Restrictions Precautions Precautions: Fall Restrictions Weight Bearing Restrictions: Yes LLE Weight Bearing: Non weight bearing    Mobility  Bed Mobility Overal bed mobility: Modified Independent                Transfers Overall transfer level: Needs assistance Equipment used: Rolling walker (2 wheeled) Transfers: Sit to/from [employer] Sit to Stand: Min guard Stand pivot transfers: Min guard       General transfer comment: min guard for safety  Ambulation/Gait             General Gait Details: declining gait due to recently returning from bathroom   Stairs             Wheelchair Mobility    Modified Rankin (Stroke Patients Only)       Balance Overall balance assessment: Needs assistance Sitting-balance support: No upper extremity supported;Feet supported Sitting balance-Leahy Scale: Good     Standing balance support: Bilateral upper extremity supported;During functional activity Standing balance-Leahy  Scale: Poor Standing balance comment: reliant on BUE support of RW                            Cognition Arousal/Alertness: Awake/alert Behavior During Therapy: WFL for tasks assessed/performed Overall Cognitive Status: Within Functional Limits for tasks assessed                                        Exercises Amputee Exercises Quad Sets: AROM;Left;10 reps;Supine Hip ABduction/ADduction: AROM;Both;5 reps;Supine Knee Flexion: AROM;Left;10 reps;Supine Knee Extension: AROM;Left;10 reps;Supine    General Comments General comments (skin integrity, edema, etc.): Discussed interventions to reduce phantom pain.      Pertinent Vitals/Pain Pain Assessment: 0-10 Pain Score: 3  Pain Location: R hip Pain Descriptors / Indicators: Discomfort;Sore Pain Intervention(s): Monitored during session;Limited activity within patient's tolerance    Home Living                      Prior Function            PT Goals (current goals can now be found in the care plan section) Acute Rehab PT Goals Patient Stated Goal: to return to independent mobility Progress towards PT goals: Progressing toward goals    Frequency    Min 3X/week      PT Plan Current plan remains appropriate    Co-evaluation              AM-PAC PT "6 Clicks" Mobility  Outcome Measure  Help needed turning from your back to your side while in a flat bed without using bedrails?: None Help needed moving from lying on your back to sitting on the side of a flat bed without using bedrails?: None Help needed moving to and from a bed to a chair (including a wheelchair)?: A Little Help needed standing up from a chair using your arms (e.g., wheelchair or bedside chair)?: A Little Help needed to walk in hospital room?: A Little Help needed climbing 3-5 steps with a railing? : A Lot 6 Click Score: 19    End of Session   Activity Tolerance: Patient tolerated treatment well Patient  left: in bed;with call bell/phone within reach Nurse Communication: Mobility status PT Visit Diagnosis: Unsteadiness on feet (R26.81);Other abnormalities of gait and mobility (R26.89)     Time: 1856-3149 PT Time Calculation (min) (ACUTE ONLY): 20 min  Charges:  $Therapeutic Exercise: 8-22 mins                     Aida Raider, PT  Office # [phone] Pager [phone]    Ilda Foil 10/06/2020, 12:32 PM

## 2020-10-06 NOTE — Progress Notes (Signed)
Inpatient Rehabilitation-Admissions Coordinator   Consult received.  However noted pt COVID + 10/02/20. Patients are eligible to be considered for admit to CIR when cleared from airborne precautions by acute MD or Infectious Disease regardless of onset date. Otherwise they will need to be >20 days from their positive test with recovery/improvement in symptoms or 2 negative tests. Will discontinue consult at this time.   Cheri Rous, OTR/L  Rehab Admissions Coordinator  [phone] 10/06/2020 9:29 AM

## 2020-10-06 NOTE — Discharge Summary (Signed)
[facility]  Physician Discharge Summary   Patient ID: Anthony StageGlenn A Haynes MRN: [id number] DOB/AGE: April 01, 1961 60 y.o.  Admit date: 10/03/2020 Discharge date: 10/06/2020  PCP: Patient, No Pcp Per  DISCHARGE DIAGNOSES:  Nonhealing left transmetatarsal amputation now status post BKA COVID-19 virus infection, mild Normocytic anemia Folic acid deficiency Peripheral vascular disease Diabetes mellitus type 2, controlled Essential hypertension  RECOMMENDATIONS FOR OUTPATIENT FOLLOW UP: 1. Outpatient follow-up with vascular surgery    Home Health: Home health PT and OT Equipment/Devices:Wheelchair, shower stool  CODE STATUS: Full code  DISCHARGE CONDITION: fair  Diet recommendation: Heart healthy  INITIAL HISTORY: 60 y.o.malewith PMH significant for peripheral vascular disease status post multiple episodes of osteomyelitis and amputation status post left TMA, DM 2, HTN and recent cessation of alcohol was admitted secondary to ongoing infection/osteomyelitis and nonhealing of previous left TMA.Routine preop labs noted that patient was COVID-positive. He was noted to be asymptomatic at the time.   Consultations:  Vascular surgery  Procedures:  Left below-knee amputation    HOSPITAL COURSE:   Nonhealing left TMA status post BKA Patient was seen in vascular surgery and underwent left below-knee amputation.  Pain medication regimen per vascular surgery.  Bowel regimen.  COVID-19 virus infection Patient is asymptomatic without any oxygen requirement.  He does not know of any known exposure.  He had tested negative for COVID end of December.  No one is sick in his household.  Unclear when he was exposed and when he contracted the virus.  Not a candidate for Remdesivir at this time.  Chest x-ray showed atelectasis. CRP was only 3.8.  Procalcitonin was less than 0.1.  D-dimer 2.18.  Elevated D-dimer could be due to COVID-19 as well as his other acute illness.  No  indication for further work-up at this time. He can come off of isolation on 1/28  Normocytic anemia/folic acid deficiency Apparently has had a drop in his hemoglobin since December.  No overt bleeding noted. Could be due to operative losses.  Hemoglobin remains low but stable.  Anemia panel does show folic acid deficiency.  Ferritin 85, iron 30 TIBC 385. Patient started on folic acid.  Peripheral vascular disease Continue aspirin and Plavix.  Continue statin.  Diabetes mellitus type 2, controlled On metformin at home.  Continue SSI.  HbA1c 6.2. CBGs are reasonably well controlled.  Essential hypertension Elevated blood pressure could be due to pain but they have been significantly elevated at times.  We will increase the dose of his lisinopril .  Obesity Estimated body mass index is 31.19 kg/m as calculated from the following:   Height as of this encounter: 6' (1.829 m).   Weight as of this encounter: 104.3 kg.   Okay for discharge home today from medical standpoint.   PERTINENT LABS:  The results of significant diagnostics from this hospitalization (including imaging, microbiology, ancillary and laboratory) are listed below for reference.    Microbiology: Recent Results (from the past 240 hour(s))  SARS CORONAVIRUS 2 (TAT 6-24 HRS) Nasopharyngeal Nasopharyngeal Swab     Status: Abnormal   Collection Time: 10/02/20  8:29 AM   Specimen: Nasopharyngeal Swab  Result Value Ref Range Status   SARS Coronavirus 2 POSITIVE (A) NEGATIVE Final    Comment: (NOTE) SARS-CoV-2 target nucleic acids are DETECTED.  The SARS-CoV-2 RNA is generally detectable in upper and lower respiratory specimens during the acute phase of infection. Positive results are indicative of the presence of SARS-CoV-2 RNA. Clinical correlation with patient history and other diagnostic  information is  necessary to determine patient infection status. Positive results do not rule out bacterial infection or  co-infection with other viruses.  The expected result is Negative.  Fact Sheet for Patients: HairSlick.no  Fact Sheet for Healthcare Providers: quierodirigir.com  This test is not yet approved or cleared by the [place] FDA and  has been authorized for detection and/or diagnosis of SARS-CoV-2 by FDA under an Emergency Use Authorization (EUA). This EUA will remain  in effect (meaning this test can be used) for the duration of the COVID-19 declaration under Section 564(b)(1) of the Act, 21 U. S.C. section 360bbb-3(b)(1), unless the authorization is terminated or revoked sooner.   Performed at [facility], [address], [place], [place] [address]   Surgical pcr screen     Status: None   Collection Time: 10/03/20  6:35 AM   Specimen: Nasal Mucosa; Nasal Swab  Result Value Ref Range Status   MRSA, PCR NEGATIVE NEGATIVE Final   Staphylococcus aureus NEGATIVE NEGATIVE Final    Comment: (NOTE) The Xpert SA Assay (FDA approved for NASAL specimens in patients 90 years of age and older), is one component of a comprehensive surveillance program. It is not intended to diagnose infection nor to guide or monitor treatment. Performed at [facility], [address] [place], [place] [address]      Labs:  COVID-19 Labs  Recent Labs    10/04/20 0147  FERRITIN 85    Lab Results  Component Value Date   SARSCOV2NAA POSITIVE (A) 10/02/2020   SARSCOV2NAA NEGATIVE 09/10/2020   SARSCOV2NAA NEGATIVE 08/19/2020   SARSCOV2NAA NEGATIVE 08/05/2020      Basic Metabolic Panel: Recent Labs  Lab 10/03/20 0630 10/04/20 0147 10/05/20 0614 10/06/20 0342  NA 140 135 138 137  K 4.4 4.1 3.8 3.8  CL 108 102 103 102  CO2 20* 22 26 26   GLUCOSE 127* 191* 111* 127*  BUN 8 11 11 10   CREATININE 0.58* 0.71 0.72 0.65  CALCIUM 9.1 8.4* 8.7* 8.8*   Liver Function Tests: Recent Labs  Lab 10/03/20 0630  AST 26  ALT 15   ALKPHOS 46  BILITOT 1.0  PROT 6.6  ALBUMIN 3.4*   CBC: Recent Labs  Lab 10/03/20 0630 10/04/20 0147 10/05/20 0614 10/06/20 0342  WBC 9.9 11.1* 8.0 7.4  HGB 9.3* 8.5* 8.1* 8.1*  HCT 30.3* 25.7* 25.4* 26.1*  MCV 91.8 89.5 90.7 92.2  PLT 290 273 262 254    CBG: Recent Labs  Lab 10/05/20 1134 10/05/20 1640 10/05/20 2112 10/06/20 0603 10/06/20 1109  GLUCAP 134* 113* 120* 128* 200*     IMAGING STUDIES DG Chest Port 1 View  Result Date: 10/03/2020 CLINICAL DATA:  Reported COVID-19 positive. EXAM: PORTABLE CHEST 1 VIEW COMPARISON:  None. FINDINGS: A small portion of the lateral left base is not included on this study. There is minimal right base atelectasis. Visualized lungs otherwise clear. Heart is upper normal in size with pulmonary vascularity normal. No adenopathy. Central catheter tip in superior vena cava. No pneumothorax. IMPRESSION: Minimal right base atelectasis. Visualized lungs otherwise clear. Note that a small portion of the lateral left base is not included on this study. Heart upper normal in size. Central catheter tip in superior vena cava. Electronically Signed   By: 10/08/20 III M.D.   On: 10/03/2020 13:59   IR PICC PLACEMENT RIGHT >5 YRS INC IMG GUIDE  Result Date: 10/01/2020 INDICATION: Patient with chronic osteomyelitis in need of durable  venous access for IV antibiotics. Request is made for peripherally inserted central venous catheter placement. EXAM: PICC LINE PLACEMENT WITH ULTRASOUND AND FLUOROSCOPIC GUIDANCE MEDICATIONS: 2 mL 1% lidocaine ANESTHESIA/SEDATION: None FLUOROSCOPY TIME:  Fluoroscopy Time: 0 minutes 54 seconds COMPLICATIONS: None immediate. PROCEDURE: The patient was advised of the possible risks and complications and agreed to undergo the procedure. The patient was then brought to the angiographic suite for the procedure. The right arm was prepped with chlorhexidine, draped in the usual sterile fashion using maximum barrier technique  (cap and mask, sterile gown, sterile gloves, large sterile sheet, hand hygiene and cutaneous antisepsis) and infiltrated locally with 1% Lidocaine. Ultrasound demonstrated patency of the right basilic vein, and this was documented with an image. Under real-time ultrasound guidance, this vein was accessed with a 21 gauge micropuncture needle and image documentation was performed. A 0.018 wire was introduced in to the vein. Over this, a 5 [place] single lumen power PICC was advanced to the lower SVC/right atrial junction. Fluoroscopy during the procedure and fluoro spot radiograph confirms appropriate catheter position. The catheter was flushed and covered with asterile dressing. Catheter length: 34 cm IMPRESSION: Successful right arm power PICC line placement with ultrasound and fluoroscopic guidance. The catheter is ready for use. Read by: Loyce Dys PA-C Electronically Signed   By: Gilmer Mor D.O.   On: 10/01/2020 10:57    DISCHARGE EXAMINATION: See progress note from earlier today  DISPOSITION: Home  Discharge Instructions    Call MD for:  difficulty breathing, headache or visual disturbances   Complete by: As directed    Call MD for:  extreme fatigue   Complete by: As directed    Call MD for:  persistant dizziness or light-headedness   Complete by: As directed    Call MD for:  persistant nausea and vomiting   Complete by: As directed    Call MD for:  severe uncontrolled pain   Complete by: As directed    Call MD for:  temperature >100.4   Complete by: As directed    Diet - low sodium heart healthy   Complete by: As directed    Discharge instructions   Complete by: As directed    Please take your medications as prescribed.  The dose of your lisinopril has been increased due to elevated blood pressures here in the hospital.  Further wound instructions and pain medication instructions per vascular surgery. You can come off of isolation for COVID-19 on 10/10/2020  You were cared for  by a hospitalist during your hospital stay. If you have any questions about your discharge medications or the care you received while you were in the hospital after you are discharged, you can call the unit and asked to speak with the hospitalist on call if the hospitalist that took care of you is not available. Once you are discharged, your primary care physician will handle any further medical issues. Please note that NO REFILLS for any discharge medications will be authorized once you are discharged, as it is imperative that you return to your primary care physician (or establish a relationship with a primary care physician if you do not have one) for your aftercare needs so that they can reassess your need for medications and monitor your lab values. If you do not have a primary care physician, you can call [phone] for a physician referral.   Discharge wound care:   Complete by: As directed    Per vascular surgery   Increase activity slowly  Complete by: As directed          Allergies as of 10/06/2020      Reactions   Meloxicam Nausea Only   Upset stomach       Medication List    STOP taking these medications   ceFEPIme 2 g in sodium chloride 0.9 % 100 mL   ciprofloxacin 500 MG tablet Commonly known as: CIPRO   doxycycline 100 MG tablet Commonly known as: ADOXA   GARLIC PO   meloxicam 7.5 MG tablet Commonly known as: Mobic   oxyCODONE 5 MG immediate release tablet Commonly known as: Oxy IR/ROXICODONE   Turmeric 500 MG Caps     TAKE these medications   acetaminophen 500 MG tablet Commonly known as: TYLENOL Take 1,000 mg by mouth every 6 (six) hours as needed for moderate pain.   ascorbic acid 500 MG tablet Commonly known as: VITAMIN C Take 500 mg by mouth daily.   aspirin EC 81 MG tablet Take 1 tablet (81 mg total) by mouth 2 (two) times daily after a meal. Swallow whole. What changed: when to take this   atorvastatin 10 MG tablet Commonly known as:  Lipitor Take 1 tablet (10 mg total) by mouth daily.   cholecalciferol 25 MCG (1000 UNIT) tablet Commonly known as: VITAMIN D3 Take 1,000 Units by mouth daily.   clopidogrel 75 MG tablet Commonly known as: Plavix Take 1 tablet (75 mg total) by mouth daily.   cyclobenzaprine 5 MG tablet Commonly known as: FLEXERIL Take 1 tablet (5 mg total) by mouth 3 (three) times daily as needed for muscle spasms.   folic acid 1 MG tablet Commonly known as: FOLVITE Take 1 tablet (1 mg total) by mouth daily. Start taking on: October 07, 2020   lisinopril 40 MG tablet Commonly known as: ZESTRIL Take 1 tablet (40 mg total) by mouth daily. What changed:   medication strength  how much to take   metFORMIN 500 MG tablet Commonly known as: Glucophage Take 1 tablet (500 mg total) by mouth 2 (two) times daily with a meal.   oxyCODONE-acetaminophen 5-325 MG tablet Commonly known as: PERCOCET/ROXICET Take 1-2 tablets by mouth every 4 (four) hours as needed for moderate pain.   polyethylene glycol 17 g packet Commonly known as: MIRALAX / GLYCOLAX Take 17 g by mouth daily as needed for mild constipation.   senna 8.6 MG Tabs tablet Commonly known as: SENOKOT Take 2 tablets (17.2 mg total) by mouth at bedtime.            Discharge Care Instructions  (From admission, onward)         Start     Ordered   10/06/20 0000  Discharge wound care:       Comments: Per vascular surgery   10/06/20 2426            Follow-up Information    [name], MD In 5 weeks.   Specialty: Vascular Surgery Why: Office will call you to arrange your appt (sent) Contact information: [address] [place] [address] [phone]               TOTAL DISCHARGE TIME: 35 minutes  Adel Neyer Rito Ehrlich  [facility] Pager on [email or web address]  10/06/2020, 1:40 PM

## 2020-10-06 NOTE — Progress Notes (Signed)
Orthopedic Tech Progress Note Patient Details:  Anthony Park 21-Feb-1961 [id number] Called in order to [facility] for a RETENTION SOCK LLE BKA Patient ID: Anthony Park, male   DOB: [birth year], 60 y.o.   MRN: [id number]   [name] 10/06/2020, 8:21 AM

## 2020-10-06 NOTE — Progress Notes (Addendum)
  Progress Note    10/06/2020 7:52 AM 3 Days Post-Op  Subjective:  Still requiring IV pain medication   Vitals:   10/06/20 0020 10/06/20 0400  BP: (!) 158/82 (!) 182/90  Pulse: 80   Resp: 13 20  Temp: 98.8 F (37.1 C) 98.7 F (37.1 C)  SpO2: 100% 97%    Physical Exam: Incisions:  L BKA incision healing well; skin edges viable; no areas of fluctuance   CBC    Component Value Date/Time   WBC 7.4 10/06/2020 0342   RBC 2.83 (L) 10/06/2020 0342   HGB 8.1 (L) 10/06/2020 0342   HCT 26.1 (L) 10/06/2020 0342   PLT 254 10/06/2020 0342   MCV 92.2 10/06/2020 0342   MCH 28.6 10/06/2020 0342   MCHC 31.0 10/06/2020 0342   RDW 16.2 (H) 10/06/2020 0342   LYMPHSABS 2.3 08/03/2020 1648   MONOABS 0.9 08/03/2020 1648   EOSABS 0.0 08/03/2020 1648   BASOSABS 0.1 08/03/2020 1648    BMET    Component Value Date/Time   NA 137 10/06/2020 0342   K 3.8 10/06/2020 0342   CL 102 10/06/2020 0342   CO2 26 10/06/2020 0342   GLUCOSE 127 (H) 10/06/2020 0342   BUN 10 10/06/2020 0342   BUN 14 02/13/2014 1044   CREATININE 0.65 10/06/2020 0342   CREATININE 0.75 09/23/2020 1058   CALCIUM 8.8 (L) 10/06/2020 0342   GFRNONAA >60 10/06/2020 0342   GFRNONAA >60 02/13/2014 1044   GFRAA >60 05/20/2020 0933   GFRAA >60 02/13/2014 1044    INR    Component Value Date/Time   INR 1.1 10/03/2020 0630     Intake/Output Summary (Last 24 hours) at 10/06/2020 0752 Last data filed at 10/06/2020 0400 Gross per 24 hour  Intake --  Output 2720 ml  Net -2720 ml     Assessment/Plan:  60 y.o. male is s/p left below knee amputation  3 Days Post-Op  - Dressing changed today; incision healing well; retention sock ordered - PT recommending HH PT - Ok for discharge from vascular surgery standpoint when pain controlled with p.o. medication    Emilie Rutter, PA-C [facility] [phone] 10/06/2020 7:52 AM   I have seen and evaluated the patient. I agree with the PA note as  documented above.  60 year old male status post left BKA.  This is healing without issue.  Does not appear he will qualify for CIR given his Covid status.  He is having no symptoms from a Covid standpoint.  He feels he is okay for discharge today.  PT recommends home health.  We will send a pain prescription to his pharmacy for 1-2 Percocet every 4 hours as needed which is of big concern to him and also Flexeril.  Will arrange follow-up in 3 to 4 weeks for staple removal.  Okay for discharge from my standpoint.  [name], MD [facility] Office: [phone]

## 2020-10-06 NOTE — TOC Transition Note (Addendum)
Transition of Care (TOC) - CM/SW Discharge Note Donn Pierini RN, BSN Transitions of Care Unit 4E- RN Case Manager See Treatment Team for direct phone #    Patient Details  Name: Anthony Park MRN: [id number] Date of Birth: 22-May-1961  Transition of Care [facility]) CM/SW Contact:  Darrold Span, RN Phone Number: 10/06/2020, 1:48 PM   Clinical Narrative:    Noted d/c order has been placed and HHPT/OT orders placed. CM placed call to pt in room as pt is +COVID on 1/20. Per conversation with pt he states that he has walker and home and ramp, he does not want a w/c at this time- stating he will get one later if he needs one. Declined any other DME needs. Discussed HH therapy which pt is agreeable to, choice offered Per CMS guidelines from medicare.gov website with star ratings (copy placed in shadow chart), pt reports that he has been with [facility] in the past would like to see if they can service again if not then he does not have a preference. Verified address and phone # with pt. Also asked about PCP- per pt he is trying to establish primary care with [facility]- they have a new provider starting and he is to get an appointment with that new provider when they start to schedule- hopefully next month.   Call made to [place] with [facility] regarding referral for PT/OT- however [facility] is unable to accept again at this time.   Call made to [facility] with Frances Furbish who was able to accept referral for HHPT/OT. Patient called and updated on [facility] arrangements.    Final next level of care: Home w Home Health Services Barriers to Discharge: No Barriers Identified   Patient Goals and CMS Choice Patient states their goals for this hospitalization and ongoing recovery are:: return home CMS Medicare.gov Compare Post Acute Care list provided to:: Patient Choice offered to / list presented to : Patient  Discharge Placement                Home with [facility]        Discharge Plan and Services    Discharge Planning Services: CM Consult Post Acute Care Choice: Home Health,Durable Medical Equipment          DME Arranged: N/A DME Agency: NA       HH Arranged: PT,OT HH Agency: [facility] Date [facility] Agency Contacted: 10/06/20 Time HH Agency Contacted: [phone] Representative spoke with at [facility] Agency: Kandee Keen  Social Determinants of Health (SDOH) Interventions     Readmission Risk Interventions Readmission Risk Prevention Plan 10/06/2020  Post Dischage Appt Complete  Medication Screening Complete  Transportation Screening Complete  Some recent data might be hidden

## 2020-10-06 NOTE — Progress Notes (Signed)
[facility] PROGRESS NOTE   Anthony Park EUM:[id number] DOB: 1961/01/01 DOA: 10/03/2020  PCP: Patient, No Pcp Per  Brief History/Interval Summary: 60 y.o. male with PMH significant for peripheral vascular disease status post multiple episodes of osteomyelitis and amputation status post left TMA, DM 2, HTN and recent cessation of alcohol was admitted secondary to ongoing infection/osteomyelitis and nonhealing of previous left TMA. Routine preop labs noted that patient was COVID-positive.  He was noted to be asymptomatic at the time.  Reason for Visit: Nonhealing left transmetatarsal amputation site  Consultants: Vascular surgery  Procedures: Left below-knee amputation  Antibiotics: Anti-infectives (From admission, onward)   Start     Dose/Rate Route Frequency Ordered Stop   10/03/20 1800  ceFAZolin (ANCEF) IVPB 2g/100 mL premix        2 g 200 mL/hr over 30 Minutes Intravenous Every 8 hours 10/03/20 1308 10/04/20 0427   10/03/20 0557  ceFAZolin (ANCEF) IVPB 2g/100 mL premix        2 g 200 mL/hr over 30 Minutes Intravenous 30 min pre-op 10/03/20 0557 10/03/20 1027      Subjective/Interval History: Patient continues to have pain in the left stump.  No respiratory symptoms.  No other symptoms mentioned.     Assessment/Plan:  Nonhealing left TMA status post BKA Vascular surgery is following.  Pain control. PT and OT.  Pain medications per vascular surgery.  Plan is for patient to go to home with home health.  COVID-19 virus infection Patient is asymptomatic without any oxygen requirement.  He does not know of any known exposure.  He had tested negative for COVID end of December.  No one is sick in his household.  Unclear when he was exposed and when he contracted the virus.  Not a candidate for Remdesivir at this time.  Chest x-ray showed atelectasis.  CRP was only 3.8.  Procalcitonin was less than 0.1.  D-dimer 2.18.  Elevated D-dimer could be due to COVID-19 as well as  his other acute illness.  No indication for further work-up at this time. He can come off of isolation on 1/28  Normocytic anemia/folic acid deficiency Apparently has had a drop in his hemoglobin since December.  No overt bleeding noted. Could be due to operative losses.  Hemoglobin remains low but stable.  Anemia panel does show folic acid deficiency.  Ferritin 85, iron 30 TIBC 385. Patient started on folic acid.  Peripheral vascular disease Continue aspirin and Plavix.  Continue statin.  Diabetes mellitus type 2, controlled On metformin at home.  Continue SSI.  HbA1c 6.2. CBGs are reasonably well controlled.  Essential hypertension Elevated blood pressure could be due to pain but they have been significantly elevated at times.  We will increase the dose of his lisinopril .  Obesity Estimated body mass index is 31.19 kg/m as calculated from the following:   Height as of this encounter: 6' (1.829 m).   Weight as of this encounter: 104.3 kg.    DVT Prophylaxis: Subcutaneous heparin Code Status: Full code Family Communication: Discussed with the patient Disposition Plan: Patient is stable from medical standpoint for discharge.  Still having some pain issues which will be deferred to vascular surgery.  Status is: Inpatient  Remains inpatient appropriate because:Ongoing active pain requiring inpatient pain management, IV treatments appropriate due to intensity of illness or inability to take PO and Inpatient level of care appropriate due to severity of illness   Dispo: The patient is from: Home  Anticipated d/c is to: Home              Anticipated d/c date is: 1/24              Patient currently is not medically stable to d/c.   Difficult to place patient No    Medications:  Scheduled: . aspirin EC  81 mg Oral Daily  . atorvastatin  10 mg Oral Daily  . Chlorhexidine Gluconate Cloth  6 each Topical Daily  . Chlorhexidine Gluconate Cloth  6 each Topical Daily  .  clopidogrel  75 mg Oral Daily  . docusate sodium  100 mg Oral Daily  . folic acid  1 mg Oral Daily  . heparin  5,000 Units Subcutaneous Q8H  . insulin aspart  0-6 Units Subcutaneous TID WC  . lisinopril  40 mg Oral Daily   Continuous:  QIO:[id number] **OR** acetaminophen, bisacodyl, guaiFENesin-dextromethorphan, hydrALAZINE, magnesium citrate, methocarbamol, morphine injection, nicotine, ondansetron, oxyCODONE-acetaminophen, phenol, polyethylene glycol   Objective:  Vital Signs  Vitals:   10/05/20 1935 10/06/20 0020 10/06/20 0400 10/06/20 0813  BP: 139/87 (!) 158/82 (!) 182/90 (!) 160/96  Pulse: 85 80  85  Resp: 20 13 20 19   Temp: 98 F (36.7 C) 98.8 F (37.1 C) 98.7 F (37.1 C) 98.4 F (36.9 C)  TempSrc: Oral Oral Oral Oral  SpO2: 98% 100% 97% 98%  Weight:      Height:        Intake/Output Summary (Last 24 hours) at 10/06/2020 0932 Last data filed at 10/06/2020 0400 Gross per 24 hour  Intake --  Output 2320 ml  Net -2320 ml   Filed Weights   10/01/20 1318 10/03/20 0545  Weight: 104.3 kg 104.3 kg    General appearance: Awake alert.  In no distress Resp: Clear to auscultation bilaterally.  Normal effort Cardio: S1-S2 is normal regular.  No S3-S4.  No rubs murmurs or bruit GI: Abdomen is soft.  Nontender nondistended.  Bowel sounds are present normal.  No masses organomegaly Extremities: Left lower extremity stump covered in dressing Neurologic: Alert and oriented x3.  No focal neurological deficits.        Lab Results:  Data Reviewed: I have personally reviewed following labs and imaging studies  CBC: Recent Labs  Lab 10/03/20 0630 10/04/20 0147 10/05/20 0614 10/06/20 0342  WBC 9.9 11.1* 8.0 7.4  HGB 9.3* 8.5* 8.1* 8.1*  HCT 30.3* 25.7* 25.4* 26.1*  MCV 91.8 89.5 90.7 92.2  PLT 290 273 262 254    Basic Metabolic Panel: Recent Labs  Lab 10/03/20 0630 10/04/20 0147 10/05/20 0614 10/06/20 0342  NA 140 135 138 137  K 4.4 4.1 3.8 3.8  CL  108 102 103 102  CO2 20* 22 26 26   GLUCOSE 127* 191* 111* 127*  BUN 8 11 11 10   CREATININE 0.58* 0.71 0.72 0.65  CALCIUM 9.1 8.4* 8.7* 8.8*    GFR: Estimated Creatinine Clearance: 124.2 mL/min (by C-G formula based on SCr of 0.65 mg/dL).  Liver Function Tests: Recent Labs  Lab 10/03/20 0630  AST 26  ALT 15  ALKPHOS 46  BILITOT 1.0  PROT 6.6  ALBUMIN 3.4*     Coagulation Profile: Recent Labs  Lab 10/03/20 0630  INR 1.1    CBG: Recent Labs  Lab 10/05/20 0558 10/05/20 1134 10/05/20 1640 10/05/20 2112 10/06/20 0603  GLUCAP 110* 134* 113* 120* 128*     Thyroid Function Tests: Recent Labs    10/04/20 0147  TSH 0.431  Anemia Panel: Recent Labs    10/04/20 0147  VITAMINB12 1,080*  FOLATE 4.6*  FERRITIN 85  TIBC 385  IRON 30*    Recent Results (from the past 240 hour(s))  SARS CORONAVIRUS 2 (TAT 6-24 HRS) Nasopharyngeal Nasopharyngeal Swab     Status: Abnormal   Collection Time: 10/02/20  8:29 AM   Specimen: Nasopharyngeal Swab  Result Value Ref Range Status   SARS Coronavirus 2 POSITIVE (A) NEGATIVE Final    Comment: (NOTE) SARS-CoV-2 target nucleic acids are DETECTED.  The SARS-CoV-2 RNA is generally detectable in upper and lower respiratory specimens during the acute phase of infection. Positive results are indicative of the presence of SARS-CoV-2 RNA. Clinical correlation with patient history and other diagnostic information is  necessary to determine patient infection status. Positive results do not rule out bacterial infection or co-infection with other viruses.  The expected result is Negative.  Fact Sheet for Patients: HairSlick.no  Fact Sheet for Healthcare Providers: quierodirigir.com  This test is not yet approved or cleared by the [place] FDA and  has been authorized for detection and/or diagnosis of SARS-CoV-2 by FDA under an Emergency Use Authorization (EUA). This  EUA will remain  in effect (meaning this test can be used) for the duration of the COVID-19 declaration under Section 564(b)(1) of the Act, 21 U. S.C. section 360bbb-3(b)(1), unless the authorization is terminated or revoked sooner.   Performed at [facility], [address] [place], [place] [address]   Surgical pcr screen     Status: None   Collection Time: 10/03/20  6:35 AM   Specimen: Nasal Mucosa; Nasal Swab  Result Value Ref Range Status   MRSA, PCR NEGATIVE NEGATIVE Final   Staphylococcus aureus NEGATIVE NEGATIVE Final    Comment: (NOTE) The Xpert SA Assay (FDA approved for NASAL specimens in patients 36 years of age and older), is one component of a comprehensive surveillance program. It is not intended to diagnose infection nor to guide or monitor treatment. Performed at [facility], [address] [place], [place] [address]       Radiology Studies: No results found.     LOS: 3 days   Soua Lenk [employer] on [email or web address]  10/06/2020, 9:32 AM

## 2020-10-10 ENCOUNTER — Ambulatory Visit: Payer: BC Managed Care – PPO | Admitting: Internal Medicine

## 2020-10-10 ENCOUNTER — Telehealth: Payer: Self-pay

## 2020-10-10 DIAGNOSIS — E782 Mixed hyperlipidemia: Secondary | ICD-10-CM | POA: Diagnosis not present

## 2020-10-10 DIAGNOSIS — Z96642 Presence of left artificial hip joint: Secondary | ICD-10-CM | POA: Diagnosis not present

## 2020-10-10 DIAGNOSIS — Z89421 Acquired absence of other right toe(s): Secondary | ICD-10-CM | POA: Diagnosis not present

## 2020-10-10 DIAGNOSIS — Z6831 Body mass index (BMI) 31.0-31.9, adult: Secondary | ICD-10-CM | POA: Diagnosis not present

## 2020-10-10 DIAGNOSIS — D649 Anemia, unspecified: Secondary | ICD-10-CM | POA: Diagnosis not present

## 2020-10-10 DIAGNOSIS — U071 COVID-19: Secondary | ICD-10-CM | POA: Diagnosis not present

## 2020-10-10 DIAGNOSIS — T8744 Infection of amputation stump, left lower extremity: Secondary | ICD-10-CM | POA: Diagnosis not present

## 2020-10-10 DIAGNOSIS — Z7984 Long term (current) use of oral hypoglycemic drugs: Secondary | ICD-10-CM | POA: Diagnosis not present

## 2020-10-10 DIAGNOSIS — T8789 Other complications of amputation stump: Secondary | ICD-10-CM | POA: Diagnosis not present

## 2020-10-10 DIAGNOSIS — Z95820 Peripheral vascular angioplasty status with implants and grafts: Secondary | ICD-10-CM | POA: Diagnosis not present

## 2020-10-10 DIAGNOSIS — Z7902 Long term (current) use of antithrombotics/antiplatelets: Secondary | ICD-10-CM | POA: Diagnosis not present

## 2020-10-10 DIAGNOSIS — Z87442 Personal history of urinary calculi: Secondary | ICD-10-CM | POA: Diagnosis not present

## 2020-10-10 DIAGNOSIS — Z7982 Long term (current) use of aspirin: Secondary | ICD-10-CM | POA: Diagnosis not present

## 2020-10-10 DIAGNOSIS — I10 Essential (primary) hypertension: Secondary | ICD-10-CM | POA: Diagnosis not present

## 2020-10-10 DIAGNOSIS — E1151 Type 2 diabetes mellitus with diabetic peripheral angiopathy without gangrene: Secondary | ICD-10-CM | POA: Diagnosis not present

## 2020-10-10 NOTE — Telephone Encounter (Signed)
Amy with [facility] saw pt today and will see him once a week x 5 weeks. She said wound looked good, had small amount of bloody drainage from incision but otherwise was WNL. I spoke to pt and he said it is occasionally blood spotted when he changes dressing. He will call us if he any questions or concerns that arise.

## 2020-10-13 ENCOUNTER — Telehealth: Payer: Self-pay

## 2020-10-13 ENCOUNTER — Other Ambulatory Visit: Payer: Self-pay | Admitting: Physician Assistant

## 2020-10-13 LAB — FUNGUS CULTURE WITH STAIN

## 2020-10-13 LAB — FUNGUS CULTURE RESULT

## 2020-10-13 LAB — FUNGAL ORGANISM REFLEX

## 2020-10-13 MED ORDER — OXYCODONE-ACETAMINOPHEN 5-325 MG PO TABS: 1.0000 | ORAL_TABLET | ORAL | 0 refills | Status: DC | PRN

## 2020-10-13 MED ORDER — GABAPENTIN 300 MG PO CAPS: 300.0000 mg | ORAL_CAPSULE | Freq: Every day | ORAL | 1 refills | Status: DC

## 2020-10-13 NOTE — Telephone Encounter (Signed)
Patient called c/o pain in stump. It is decreasing but still needs a refill of pain med. He is taking around 1 pill q 8 hrs - so he is weaning off narcotics. Denies fever, redness, scant amount of yellowish drainage - no odor. Also called in gabapentin - discussed with patient - he will try that as well for phantom limb pain. Patient knows to call back with further issues - will keep f/u appt.

## 2020-10-14 ENCOUNTER — Telehealth: Payer: Self-pay

## 2020-10-14 ENCOUNTER — Other Ambulatory Visit: Payer: Self-pay | Admitting: Physician Assistant

## 2020-10-14 DIAGNOSIS — D649 Anemia, unspecified: Secondary | ICD-10-CM | POA: Diagnosis not present

## 2020-10-14 DIAGNOSIS — Z7902 Long term (current) use of antithrombotics/antiplatelets: Secondary | ICD-10-CM | POA: Diagnosis not present

## 2020-10-14 DIAGNOSIS — Z7982 Long term (current) use of aspirin: Secondary | ICD-10-CM | POA: Diagnosis not present

## 2020-10-14 DIAGNOSIS — E1151 Type 2 diabetes mellitus with diabetic peripheral angiopathy without gangrene: Secondary | ICD-10-CM | POA: Diagnosis not present

## 2020-10-14 DIAGNOSIS — Z96642 Presence of left artificial hip joint: Secondary | ICD-10-CM | POA: Diagnosis not present

## 2020-10-14 DIAGNOSIS — Z7984 Long term (current) use of oral hypoglycemic drugs: Secondary | ICD-10-CM | POA: Diagnosis not present

## 2020-10-14 DIAGNOSIS — I10 Essential (primary) hypertension: Secondary | ICD-10-CM | POA: Diagnosis not present

## 2020-10-14 DIAGNOSIS — T8789 Other complications of amputation stump: Secondary | ICD-10-CM | POA: Diagnosis not present

## 2020-10-14 DIAGNOSIS — Z87442 Personal history of urinary calculi: Secondary | ICD-10-CM | POA: Diagnosis not present

## 2020-10-14 DIAGNOSIS — T8744 Infection of amputation stump, left lower extremity: Secondary | ICD-10-CM | POA: Diagnosis not present

## 2020-10-14 DIAGNOSIS — Z89421 Acquired absence of other right toe(s): Secondary | ICD-10-CM | POA: Diagnosis not present

## 2020-10-14 DIAGNOSIS — Z6831 Body mass index (BMI) 31.0-31.9, adult: Secondary | ICD-10-CM | POA: Diagnosis not present

## 2020-10-14 DIAGNOSIS — U071 COVID-19: Secondary | ICD-10-CM | POA: Diagnosis not present

## 2020-10-14 DIAGNOSIS — E782 Mixed hyperlipidemia: Secondary | ICD-10-CM | POA: Diagnosis not present

## 2020-10-14 DIAGNOSIS — Z95820 Peripheral vascular angioplasty status with implants and grafts: Secondary | ICD-10-CM | POA: Diagnosis not present

## 2020-10-14 MED ORDER — OXYCODONE-ACETAMINOPHEN 5-325 MG PO TABS
1.0000 | ORAL_TABLET | ORAL | 0 refills | Status: DC | PRN
Start: 2020-10-14 — End: 2020-10-23

## 2020-10-14 NOTE — Telephone Encounter (Signed)
Patient called to report white area near stitches that is spreading. There is a slight ooze. Denies redness, swelling, pain, fever. Placed patient on PA schedule Friday for wound check.

## 2020-10-14 NOTE — Telephone Encounter (Signed)
Received VM from [facility] OT. Today's OT visit with patient was an eval only. He required minimal assist and would like to focus on PT only.

## 2020-10-15 ENCOUNTER — Telehealth: Payer: Self-pay

## 2020-10-15 NOTE — Telephone Encounter (Signed)
Gave verbal orders to PT for 1 x a week for 5 weeks.

## 2020-10-17 ENCOUNTER — Other Ambulatory Visit: Payer: Self-pay

## 2020-10-17 ENCOUNTER — Ambulatory Visit (INDEPENDENT_AMBULATORY_CARE_PROVIDER_SITE_OTHER): Payer: BC Managed Care – PPO | Admitting: Physician Assistant

## 2020-10-17 VITALS — BP 137/78 | HR 70 | Temp 98.5°F | Resp 20 | Ht 72.0 in | Wt 230.0 lb

## 2020-10-17 DIAGNOSIS — I739 Peripheral vascular disease, unspecified: Secondary | ICD-10-CM

## 2020-10-17 MED ORDER — CEPHALEXIN 500 MG PO CAPS: 500.0000 mg | ORAL_CAPSULE | Freq: Three times a day (TID) | ORAL | 0 refills | Status: DC

## 2020-10-17 NOTE — Progress Notes (Signed)
    Postoperative Visit    History of Present Illness   Anthony Park is a 60 y.o. male who presents for postoperative follow-up for: left below-the-knee ampuation.(Date: 10/03/20) by Dr. Chestine Spore.  He was unable to heal TMA and subsequent TMA debridement.  The patient's wounds are healing well. Pain comes and goes.  He has some drainage in the mid incision but this is thin and blood tinged.  He denies fevers, chills, N/V.   For VQI Use Only   PRE-ADM LIVING: Home  AMB STATUS: Ambulatory with Assistance   Physical Examination   Vitals:   10/17/20 1014  BP: 137/78  Pulse: 70  Resp: 20  Temp: 98.5 F (36.9 C)  SpO2: 97%    LLE: Stump incision is healing well with viable skin edges.  Serosanguinous drainage in mid incision however no purulence or fluctuance  Staples are intact.   Medical Decision Making   Anthony Park is a 60 y.o. male who presents s/p left below-the-knee amputation.   Some drainage in mid incision, serosanguinous in nature.  He will be prescribed 7 days of keflex  Encouraged daily cleansing with soap and water, then pat dry  We will keep his appt with Dr. Chestine Spore in 2-3 weeks.  He knowns to call/return to office sooner if wound worsens  Emilie Rutter PA-C [facility] [place] Office: [phone]  Clinic MD: Randie Heinz

## 2020-10-20 ENCOUNTER — Other Ambulatory Visit: Payer: Self-pay

## 2020-10-21 DIAGNOSIS — D649 Anemia, unspecified: Secondary | ICD-10-CM | POA: Diagnosis not present

## 2020-10-21 DIAGNOSIS — Z96642 Presence of left artificial hip joint: Secondary | ICD-10-CM | POA: Diagnosis not present

## 2020-10-21 DIAGNOSIS — Z6831 Body mass index (BMI) 31.0-31.9, adult: Secondary | ICD-10-CM | POA: Diagnosis not present

## 2020-10-21 DIAGNOSIS — Z7902 Long term (current) use of antithrombotics/antiplatelets: Secondary | ICD-10-CM | POA: Diagnosis not present

## 2020-10-21 DIAGNOSIS — I10 Essential (primary) hypertension: Secondary | ICD-10-CM | POA: Diagnosis not present

## 2020-10-21 DIAGNOSIS — Z7982 Long term (current) use of aspirin: Secondary | ICD-10-CM | POA: Diagnosis not present

## 2020-10-21 DIAGNOSIS — T8744 Infection of amputation stump, left lower extremity: Secondary | ICD-10-CM | POA: Diagnosis not present

## 2020-10-21 DIAGNOSIS — Z7984 Long term (current) use of oral hypoglycemic drugs: Secondary | ICD-10-CM | POA: Diagnosis not present

## 2020-10-21 DIAGNOSIS — Z87442 Personal history of urinary calculi: Secondary | ICD-10-CM | POA: Diagnosis not present

## 2020-10-21 DIAGNOSIS — T8789 Other complications of amputation stump: Secondary | ICD-10-CM | POA: Diagnosis not present

## 2020-10-21 DIAGNOSIS — U071 COVID-19: Secondary | ICD-10-CM | POA: Diagnosis not present

## 2020-10-21 DIAGNOSIS — Z89421 Acquired absence of other right toe(s): Secondary | ICD-10-CM | POA: Diagnosis not present

## 2020-10-21 DIAGNOSIS — Z95820 Peripheral vascular angioplasty status with implants and grafts: Secondary | ICD-10-CM | POA: Diagnosis not present

## 2020-10-21 DIAGNOSIS — E1151 Type 2 diabetes mellitus with diabetic peripheral angiopathy without gangrene: Secondary | ICD-10-CM | POA: Diagnosis not present

## 2020-10-21 DIAGNOSIS — E782 Mixed hyperlipidemia: Secondary | ICD-10-CM | POA: Diagnosis not present

## 2020-10-23 ENCOUNTER — Telehealth: Payer: Self-pay

## 2020-10-23 ENCOUNTER — Other Ambulatory Visit: Payer: Self-pay | Admitting: Physician Assistant

## 2020-10-23 MED ORDER — METFORMIN HCL 500 MG PO TABS: 500.0000 mg | ORAL_TABLET | Freq: Two times a day (BID) | ORAL | 0 refills | Status: DC

## 2020-10-23 MED ORDER — OXYCODONE-ACETAMINOPHEN 5-325 MG PO TABS: 1.0000 | ORAL_TABLET | Freq: Four times a day (QID) | ORAL | 0 refills | Status: DC | PRN

## 2020-10-23 MED ORDER — LISINOPRIL 40 MG PO TABS: 40.0000 mg | ORAL_TABLET | Freq: Every day | ORAL | 0 refills | Status: DC

## 2020-10-23 NOTE — Progress Notes (Signed)
Pt called triage wanting refill on lisinopril, metformin and Percocet.  He has appt with new PCP on 3/14.  One month supply of Metformin and Lisinopril called in but will need further refills per PCP.    He also wanted refill on pain medication.  He did received 30 tablets at discharge and has received an additional refill for 30 tablets on 2/1.  I sent an rx for Percocet 5/325 q6h prn pain #15.  He will need to be seen if any further refills for pain medication to be prescribed by this office.    Doreatha Massed, [facility] 10/23/2020 1:37 PM

## 2020-10-23 NOTE — Telephone Encounter (Signed)
Patient sent picture via Mychart message, there is a small area of whitish drainage. He was seen on 2/4. It has not changed much since appt. He says some of the skin has been peeling since he has started showering. He has been covering with xeroform and gauze. Advised patient to leave open to the air for awhile while he is resting and then cover with dry gauze. He knows to wet the gauze prior to removal. He denies fever and other s/s of infection. He will monitor and contact the office if needed. He has a new appt with a PCP scheduled in the middle of March, sent in one month of Metformin and Lisinopril - patient knows that will have to come from PCP in the future. Also sent in #15 oxycodone, will need to be seen if future refills are needed.

## 2020-10-24 ENCOUNTER — Other Ambulatory Visit: Payer: Self-pay | Admitting: Physician Assistant

## 2020-10-27 DIAGNOSIS — T8744 Infection of amputation stump, left lower extremity: Secondary | ICD-10-CM | POA: Diagnosis not present

## 2020-10-27 DIAGNOSIS — Z95820 Peripheral vascular angioplasty status with implants and grafts: Secondary | ICD-10-CM | POA: Diagnosis not present

## 2020-10-27 DIAGNOSIS — Z7902 Long term (current) use of antithrombotics/antiplatelets: Secondary | ICD-10-CM | POA: Diagnosis not present

## 2020-10-27 DIAGNOSIS — Z89421 Acquired absence of other right toe(s): Secondary | ICD-10-CM | POA: Diagnosis not present

## 2020-10-27 DIAGNOSIS — E1151 Type 2 diabetes mellitus with diabetic peripheral angiopathy without gangrene: Secondary | ICD-10-CM | POA: Diagnosis not present

## 2020-10-27 DIAGNOSIS — D649 Anemia, unspecified: Secondary | ICD-10-CM | POA: Diagnosis not present

## 2020-10-27 DIAGNOSIS — I10 Essential (primary) hypertension: Secondary | ICD-10-CM | POA: Diagnosis not present

## 2020-10-27 DIAGNOSIS — E782 Mixed hyperlipidemia: Secondary | ICD-10-CM | POA: Diagnosis not present

## 2020-10-27 DIAGNOSIS — Z7982 Long term (current) use of aspirin: Secondary | ICD-10-CM | POA: Diagnosis not present

## 2020-10-27 DIAGNOSIS — U071 COVID-19: Secondary | ICD-10-CM | POA: Diagnosis not present

## 2020-10-27 DIAGNOSIS — Z7984 Long term (current) use of oral hypoglycemic drugs: Secondary | ICD-10-CM | POA: Diagnosis not present

## 2020-10-27 DIAGNOSIS — Z87442 Personal history of urinary calculi: Secondary | ICD-10-CM | POA: Diagnosis not present

## 2020-10-27 DIAGNOSIS — Z6831 Body mass index (BMI) 31.0-31.9, adult: Secondary | ICD-10-CM | POA: Diagnosis not present

## 2020-10-27 DIAGNOSIS — Z96642 Presence of left artificial hip joint: Secondary | ICD-10-CM | POA: Diagnosis not present

## 2020-10-27 DIAGNOSIS — T8789 Other complications of amputation stump: Secondary | ICD-10-CM | POA: Diagnosis not present

## 2020-10-28 ENCOUNTER — Other Ambulatory Visit: Payer: Self-pay | Admitting: Physician Assistant

## 2020-10-29 ENCOUNTER — Other Ambulatory Visit: Payer: Self-pay

## 2020-10-30 ENCOUNTER — Telehealth: Payer: Self-pay

## 2020-10-30 NOTE — Telephone Encounter (Signed)
Patient sent in picture via mychart message - wound looks a little more red and there is some whitish drainage. Discussed with PA and placed on schedule tomorrow - per PA he may take gabapentin BID - advised patient of appt - he says gabapentin is working fairly well. Can give new sig on gabapentin RX tomorrow.

## 2020-10-31 ENCOUNTER — Ambulatory Visit (INDEPENDENT_AMBULATORY_CARE_PROVIDER_SITE_OTHER): Payer: BC Managed Care – PPO | Admitting: Physician Assistant

## 2020-10-31 ENCOUNTER — Other Ambulatory Visit: Payer: Self-pay

## 2020-10-31 ENCOUNTER — Telehealth: Payer: Self-pay

## 2020-10-31 VITALS — BP 164/85 | HR 74 | Temp 98.4°F | Resp 20 | Ht 72.0 in | Wt 230.0 lb

## 2020-10-31 DIAGNOSIS — Z89512 Acquired absence of left leg below knee: Secondary | ICD-10-CM

## 2020-10-31 MED ORDER — CEPHALEXIN 500 MG PO CAPS: 500.0000 mg | ORAL_CAPSULE | Freq: Three times a day (TID) | ORAL | 0 refills | Status: DC

## 2020-10-31 MED ORDER — GABAPENTIN 300 MG PO CAPS: 300.0000 mg | ORAL_CAPSULE | Freq: Every day | ORAL | 1 refills | Status: DC

## 2020-10-31 MED ORDER — METHOCARBAMOL 500 MG PO TABS: 500.0000 mg | ORAL_TABLET | Freq: Three times a day (TID) | ORAL | 0 refills | Status: DC | PRN

## 2020-10-31 NOTE — Progress Notes (Signed)
    Postoperative Visit    History of Present Illness   Anthony Park is a 60 y.o. male who presents for postoperative follow-up for: left below-the-knee amputation (Date: 10/03/20) by Dr. Chestine Spore.  He was unable to heal a TMA and subsequent TMA debridement.  He was seen last week and had some drainage and redness over mid incision and was prescribed keflex.  He returns with concern about lateral aspect of incision.  He denies fevers, chills, N/V.  He would also like a refill of neurontin and muscle relaxer.     For VQI Use Only   PRE-ADM LIVING: Home  AMB STATUS: Ambulatory with Assistance   Physical Examination   Vitals:   10/31/20 0857  BP: (!) 164/85  Pulse: 74  Resp: 20  Temp: 98.4 F (36.9 C)  SpO2: 96%  _   LLE: Stump incision healed medially; superficial sloughing laterally.  Staples are intact.   Medical Decision Making   Anthony Park is a 60 y.o. male who presents s/p left below-the-knee amputation.   Skin changes appear to be superficial at this time  Encouraged daily cleansing with soap and water, then pat dry and paint with betadine  Refilled keflex  Refilled neurontin and robaxin  D/c narcotics  No indication for return to OR at this time  Follow up in 2 weeks for incision check and likely staple removal  Emilie Rutter PA-C [facility] [place] Office: [phone]  Clinic MD: Randie Heinz

## 2020-10-31 NOTE — Telephone Encounter (Signed)
Had talked to [facility] PA previously, and patient could try taking gabapentin bid since it is helping. 1 po hs was sent in and insurance wouldn't cover it. Confirmed new sig with [facility] PA and called [facility] - cancelled that rx and changed sig to 1 BID. Left VM to inform patient.

## 2020-11-08 LAB — ACID FAST CULTURE WITH REFLEXED SENSITIVITIES (MYCOBACTERIA): Acid Fast Culture: NEGATIVE

## 2020-11-11 ENCOUNTER — Ambulatory Visit (INDEPENDENT_AMBULATORY_CARE_PROVIDER_SITE_OTHER): Payer: BC Managed Care – PPO | Admitting: Vascular Surgery

## 2020-11-11 ENCOUNTER — Encounter: Payer: BC Managed Care – PPO | Admitting: Vascular Surgery

## 2020-11-11 ENCOUNTER — Other Ambulatory Visit: Payer: Self-pay

## 2020-11-11 ENCOUNTER — Encounter: Payer: Self-pay | Admitting: Vascular Surgery

## 2020-11-11 VITALS — BP 174/93 | HR 70 | Temp 98.3°F | Resp 18 | Ht 72.0 in | Wt 230.0 lb

## 2020-11-11 DIAGNOSIS — I739 Peripheral vascular disease, unspecified: Secondary | ICD-10-CM

## 2020-11-11 MED ORDER — OXYCODONE-ACETAMINOPHEN 5-325 MG PO TABS
1.0000 | ORAL_TABLET | Freq: Three times a day (TID) | ORAL | 0 refills | Status: DC | PRN
Start: 2020-11-11 — End: 2020-12-09

## 2020-11-11 NOTE — Progress Notes (Signed)
Patient name: Anthony Park MRN: [id number] DOB: 11-11-1960 Sex: male  REASON FOR VISIT: Post-op check s/p left BKA  HPI: Anthony Park is a 60 y.o. male who presents for postop check of left BKA that was performed on 10/03/2020. He recently saw Aggie Moats one of our PAs for follow-up on 10/31/2020 and he instructed him to put some Betadine paint along the incision of the BKA and also gave him a course of oral antibiotics. He has a complex history and previously had a left popliteal stent at [facility] for popliteal aneurysm. We saw him with tissue loss and ultimately he had a left peroneal angioplasty on 08/06/2020. Ultimately required a TMA on 08/20/2020 and then TMA debridement and ultimately this became nonhealing and necrotic. He was eventually converted to a BKA. Overall today feels like the wound is making progress.  Past Medical History:  Diagnosis Date  . Arthritis   . Diabetes mellitus without complication (HCC)    type 2 controlled with diet   . Diabetic toe ulcer (HCC) 11/05/2018  . History of kidney stones    passed 1 several years ago  . Hyperlipidemia   . Hypertension    not on medications  . Peripheral vascular disease (HCC)    stent in left leg due to anerysm     Past Surgical History:  Procedure Laterality Date  .  Left Transmetatarsal Ampuation (Left Foot)  08/20/2020  . ABDOMINAL AORTOGRAM W/LOWER EXTREMITY N/A 08/06/2020   Procedure: ABDOMINAL AORTOGRAM W/LOWER EXTREMITY;  Surgeon: [name], MD;  Location: [facility] INVASIVE [facility];  Service: Cardiovascular;  Laterality: N/A;  . AMPUTATION Right 11/07/2018   Procedure: RIGHT 3RD TOE AMPUTATION;  Surgeon: Nadara Mustard, MD;  Location: [facility] OR;  Service: Orthopedics;  Laterality: Right;  . AMPUTATION Left 08/20/2020   Procedure: Left Transmetatarsal Ampuation;  Surgeon: [name], MD;  Location: [facility] OR;  Service: Vascular;  Laterality: Left;  . AMPUTATION Left 10/03/2020   Procedure: LEFT BELOW KNEE  AMPUTATION;  Surgeon: [name], MD;  Location: [facility] OR;  Service: Vascular;  Laterality: Left;  . AMPUTATION TOE Left    big toe and one next to it  . CATARACT EXTRACTION Bilateral   . EYE SURGERY Bilateral    cataracts removed  . PERIPHERAL VASCULAR BALLOON ANGIOPLASTY Left 08/06/2020   Procedure: PERIPHERAL VASCULAR BALLOON ANGIOPLASTY;  Surgeon: [name], MD;  Location: [facility]VASIVE CV LAB;  Service: Cardiovascular;  Laterality: Left;  Peroneal artery.  . TOE AMPUTATION Left 2015  . TOTAL HIP ARTHROPLASTY Left 05/23/2020   Procedure: LEFT TOTAL HIP ARTHROPLASTY ANTERIOR APPROACH;  Surgeon: Kathryne Hitch, MD;  Location: [facility] ORS;  Service: Orthopedics;  Laterality: Left;  . WOUND DEBRIDEMENT Left 09/11/2020   Procedure: DEBRIDEMENT OF LEFT TRANSMETATARSAL WOUND;  Surgeon: Leonie Douglas, MD;  Location: [facility] OR;  Service: Vascular;  Laterality: Left;    Family History  Problem Relation Age of Onset  . COPD Mother   . Cataracts Mother   . Brain cancer Father   . Hypertension Brother     SOCIAL HISTORY: Social History   Tobacco Use  . Smoking status: Former Smoker    Packs/day: 0.50    Years: 41.00    Pack years: 20.50    Types: Cigarettes    Quit date: 07/20/2020    Years since quitting: 0.3  . Smokeless tobacco: Never Used  Substance Use Topics  . Alcohol use: Yes    Alcohol/week: 14.0  standard drinks    Types: 14 Standard drinks or equivalent per week    Allergies  Allergen Reactions  . Meloxicam Nausea Only    Upset stomach     Current Outpatient Medications  Medication Sig Dispense Refill  . acetaminophen (TYLENOL) 500 MG tablet Take 1,000 mg by mouth every 6 (six) hours as needed for moderate pain.    Marland Kitchen aspirin EC 81 MG tablet Take 1 tablet (81 mg total) by mouth 2 (two) times daily after a meal. Swallow whole. (Patient taking differently: Take 81 mg by mouth daily. Swallow whole.) 30 tablet 0  . atorvastatin (LIPITOR) 10 MG tablet  Take 1 tablet (10 mg total) by mouth daily. 30 tablet 11  . clopidogrel (PLAVIX) 75 MG tablet Take 1 tablet (75 mg total) by mouth daily. 30 tablet 11  . cyclobenzaprine (FLEXERIL) 5 MG tablet Take 1 tablet (5 mg total) by mouth 3 (three) times daily as needed for muscle spasms. 30 tablet 0  . folic acid (FOLVITE) 1 MG tablet Take 1 tablet (1 mg total) by mouth daily. 30 tablet 1  . gabapentin (NEURONTIN) 300 MG capsule Take 1 capsule (300 mg total) by mouth at bedtime. 30 capsule 1  . lisinopril (ZESTRIL) 40 MG tablet Take 1 tablet (40 mg total) by mouth daily. 30 tablet 0  . metFORMIN (GLUCOPHAGE) 500 MG tablet TAKE 1 TABLET BY MOUTH 2 TIMES DAILY WITH A MEAL. 60 tablet 0  . methocarbamol (ROBAXIN) 500 MG tablet Take 1 tablet (500 mg total) by mouth every 8 (eight) hours as needed for muscle spasms. 30 tablet 0  . polyethylene glycol (MIRALAX / GLYCOLAX) 17 g packet Take 17 g by mouth daily as needed for mild constipation. 14 each 0  . senna (SENOKOT) 8.6 MG TABS tablet Take 2 tablets (17.2 mg total) by mouth at bedtime. 120 tablet 0  . ascorbic acid (VITAMIN C) 500 MG tablet Take 500 mg by mouth daily. (Patient not taking: Reported on 11/11/2020)    . cephALEXin (KEFLEX) 500 MG capsule Take 1 capsule (500 mg total) by mouth 3 (three) times daily. (Patient not taking: Reported on 11/11/2020) 21 capsule 0  . cholecalciferol (VITAMIN D3) 25 MCG (1000 UNIT) tablet Take 1,000 Units by mouth daily. (Patient not taking: Reported on 11/11/2020)    . oxyCODONE-acetaminophen (PERCOCET/ROXICET) 5-325 MG tablet Take 1 tablet by mouth every 8 (eight) hours as needed for moderate pain. 20 tablet 0   No current facility-administered medications for this visit.    REVIEW OF SYSTEMS:  [X]  denotes positive finding, [ ]  denotes negative finding Cardiac  Comments:  Chest pain or chest pressure:    Shortness of breath upon exertion:    Short of breath when lying flat:    Irregular heart rhythm:        Vascular     Pain in calf, thigh, or hip brought on by ambulation:    Pain in feet at night that wakes you up from your sleep:     Blood clot in your veins:    Leg swelling:         Pulmonary    Oxygen at home:    Productive cough:     Wheezing:         Neurologic    Sudden weakness in arms or legs:     Sudden numbness in arms or legs:     Sudden onset of difficulty speaking or slurred speech:    Temporary loss of vision  in one eye:     Problems with dizziness:         Gastrointestinal    Blood in stool:     Vomited blood:         Genitourinary    Burning when urinating:     Blood in urine:        Psychiatric    Major depression:         Hematologic    Bleeding problems:    Problems with blood clotting too easily:        Skin    Rashes or ulcers:        Constitutional    Fever or chills:      PHYSICAL EXAM: Vitals:   11/11/20 0816  BP: (!) 174/93  Pulse: 70  Resp: 18  Temp: 98.3 F (36.8 C)  TempSrc: Temporal  SpO2: 98%  Weight: 230 lb (104.3 kg)  Height: 6' (1.829 m)    GENERAL: The patient is a well-nourished male, in no acute distress. The vital signs are documented above. CARDIAC: There is a regular rate and rhythm.  VASCULAR:   Left BKA pictures below, overall flap healthy, some dry necrotic areas on lateral margin      DATA:     Assessment/Plan:  60 year old male with critical limb ischemia of the left lower extremity now status post BKA. We will get the staples removed today and as pictured above I think this is making good progress. I instructed him and his wife to continue putting Betadine paint over the scabbed areas. I will see him again in 2 weeks for wound check. I would like to delay sending him to biotech until we get this completely healed. I do not think he needs another course of antibiotics. Did refill pain medicine for at night to help him rest.   [name], MD [facility]:  [phone]

## 2020-11-16 ENCOUNTER — Other Ambulatory Visit: Payer: Self-pay | Admitting: Physician Assistant

## 2020-11-17 ENCOUNTER — Encounter: Payer: Self-pay | Admitting: Orthopaedic Surgery

## 2020-11-17 ENCOUNTER — Ambulatory Visit: Payer: Self-pay

## 2020-11-17 ENCOUNTER — Ambulatory Visit (INDEPENDENT_AMBULATORY_CARE_PROVIDER_SITE_OTHER): Payer: BC Managed Care – PPO | Admitting: Orthopaedic Surgery

## 2020-11-17 DIAGNOSIS — Z96642 Presence of left artificial hip joint: Secondary | ICD-10-CM

## 2020-11-17 NOTE — Progress Notes (Signed)
The patient is however 6 months status post a left total hip arthroplasty.  He does have significant peripheral vascular disease and unfortunately recently underwent a left below-knee amputation.  He says the hip replacement is doing well.  He is working on getting the left BKA stump to heal.  He says laterally there is still an eschar and scab but medial is done well.  He does not report any significant right hip pain in light of the known arthritis in his right hip.  His left hip moves smoothly and fluidly.  His right hip has some stiffness with rotation but no severe pain.  An AP pelvis and lateral of the left hip shows a well-seated total hip arthroplasty with no complicating features.  There is significant arthritis of the right hip.  At this point follow-up can be as needed from my standpoint for his left hip.  If his right hip starts bothering him enough we can proceed with a right total hip arthroplasty but we would want him to be using a prosthesis on his left leg first.  All questions and concerns were answered and addressed.

## 2020-11-25 ENCOUNTER — Ambulatory Visit (INDEPENDENT_AMBULATORY_CARE_PROVIDER_SITE_OTHER): Payer: BC Managed Care – PPO | Admitting: Vascular Surgery

## 2020-11-25 ENCOUNTER — Encounter: Payer: Self-pay | Admitting: Vascular Surgery

## 2020-11-25 ENCOUNTER — Other Ambulatory Visit: Payer: Self-pay

## 2020-11-25 VITALS — BP 158/94 | HR 67 | Temp 98.2°F | Resp 18 | Ht 72.0 in | Wt 230.0 lb

## 2020-11-25 DIAGNOSIS — I739 Peripheral vascular disease, unspecified: Secondary | ICD-10-CM

## 2020-11-25 MED ORDER — CYCLOBENZAPRINE HCL 5 MG PO TABS: 5.0000 mg | ORAL_TABLET | Freq: Three times a day (TID) | ORAL | 0 refills | Status: DC | PRN

## 2020-11-25 MED ORDER — CEPHALEXIN 500 MG PO CAPS: 500.0000 mg | ORAL_CAPSULE | Freq: Three times a day (TID) | ORAL | 0 refills | Status: DC

## 2020-11-25 NOTE — Progress Notes (Signed)
Patient name: Anthony Park MRN: [id number] DOB: 27-Dec-1960 Sex: male  REASON FOR VISIT: Wound check s/p left BKA  HPI: Anthony Park is a 60 y.o. male who presents for 2 week follow-up for interval wound check of left BKA that was performed on 10/03/2020. He has a complex history and previously had a left popliteal stent at [facility] for popliteal aneurysm. We saw him with tissue loss and ultimately he had a left peroneal angioplasty on 08/06/2020 with tibial and small vessel disease. Ultimately required a TMA on 08/20/2020 and then TMA debridement and ultimately this became nonhealing and necrotic. He was eventually converted to a BKA.   Overall he feels like the wound is making progress since the staples were removed.  His only concern is a small area on the anterior incision of the BKA where there has been some clear drainage.  No fevers or chills.  No foul drainage.  Past Medical History:  Diagnosis Date  . Arthritis   . Diabetes mellitus without complication (HCC)    type 2 controlled with diet   . Diabetic toe ulcer (HCC) 11/05/2018  . History of kidney stones    passed 1 several years ago  . Hyperlipidemia   . Hypertension    not on medications  . Peripheral vascular disease (HCC)    stent in left leg due to anerysm     Past Surgical History:  Procedure Laterality Date  .  Left Transmetatarsal Ampuation (Left Foot)  08/20/2020  . ABDOMINAL AORTOGRAM W/LOWER EXTREMITY N/A 08/06/2020   Procedure: ABDOMINAL AORTOGRAM W/LOWER EXTREMITY;  Surgeon: [name], MD;  [facility];  Service: Cardiovascular;  Laterality: N/A;  . AMPUTATION Right 11/07/2018   Procedure: RIGHT 3RD TOE AMPUTATION;  Surgeon: Nadara Mustard, MD;  Location: [facility] OR;  Service: Orthopedics;  Laterality: Right;  . AMPUTATION Left 08/20/2020   Procedure: Left Transmetatarsal Ampuation;  Surgeon: [name], MD;  Location: [facility] OR;  Service: Vascular;  Laterality: Left;  .  AMPUTATION Left 10/03/2020   Procedure: LEFT BELOW KNEE AMPUTATION;  Surgeon: [name], MD;  Location: [facility] OR;  Service: Vascular;  Laterality: Left;  . AMPUTATION TOE Left    big toe and one next to it  . CATARACT EXTRACTION Bilateral   . EYE SURGERY Bilateral    cataracts removed  . PERIPHERAL VASCULAR BALLOON ANGIOPLASTY Left 08/06/2020   Procedure: PERIPHERAL VASCULAR BALLOON ANGIOPLASTY;  Surgeon: [name], MD;  [facility];  Service: Cardiovascular;  Laterality: Left;  Peroneal artery.  . TOE AMPUTATION Left 2015  . TOTAL HIP ARTHROPLASTY Left 05/23/2020   Procedure: LEFT TOTAL HIP ARTHROPLASTY ANTERIOR APPROACH;  Surgeon: Kathryne Hitch, MD;  Location: [facility] ORS;  Service: Orthopedics;  Laterality: Left;  . WOUND DEBRIDEMENT Left 09/11/2020   Procedure: DEBRIDEMENT OF LEFT TRANSMETATARSAL WOUND;  Surgeon: Leonie Douglas, MD;  Location: [facility] OR;  Service: Vascular;  Laterality: Left;    Family History  Problem Relation Age of Onset  . COPD Mother   . Cataracts Mother   . Brain cancer Father   . Hypertension Brother     SOCIAL HISTORY: Social History   Tobacco Use  . Smoking status: Former Smoker    Packs/day: 0.50    Years: 41.00    Pack years: 20.50    Types: Cigarettes    Quit date: 07/20/2020    Years since quitting: 0.3  . Smokeless tobacco: Never Used  Substance  Use Topics  . Alcohol use: Yes    Alcohol/week: 14.0 standard drinks    Types: 14 Standard drinks or equivalent per week    Allergies  Allergen Reactions  . Meloxicam Nausea Only    Upset stomach     Current Outpatient Medications  Medication Sig Dispense Refill  . acetaminophen (TYLENOL) 500 MG tablet Take 1,000 mg by mouth every 6 (six) hours as needed for moderate pain.    Marland Kitchen aspirin EC 81 MG tablet Take 1 tablet (81 mg total) by mouth 2 (two) times daily after a meal. Swallow whole. (Patient taking differently: Take 81 mg by mouth daily. Swallow  whole.) 30 tablet 0  . atorvastatin (LIPITOR) 10 MG tablet Take 1 tablet (10 mg total) by mouth daily. 30 tablet 11  . clopidogrel (PLAVIX) 75 MG tablet Take 1 tablet (75 mg total) by mouth daily. 30 tablet 11  . folic acid (FOLVITE) 1 MG tablet Take 1 tablet (1 mg total) by mouth daily. 30 tablet 1  . gabapentin (NEURONTIN) 300 MG capsule Take 1 capsule (300 mg total) by mouth at bedtime. 30 capsule 1  . lisinopril (ZESTRIL) 40 MG tablet Take 1 tablet (40 mg total) by mouth daily. 30 tablet 0  . methocarbamol (ROBAXIN) 500 MG tablet Take 1 tablet (500 mg total) by mouth every 8 (eight) hours as needed for muscle spasms. 30 tablet 0  . polyethylene glycol (MIRALAX / GLYCOLAX) 17 g packet Take 17 g by mouth daily as needed for mild constipation. 14 each 0  . senna (SENOKOT) 8.6 MG TABS tablet Take 2 tablets (17.2 mg total) by mouth at bedtime. 120 tablet 0  . ascorbic acid (VITAMIN C) 500 MG tablet Take 500 mg by mouth daily. (Patient not taking: No sig reported)    . cephALEXin (KEFLEX) 500 MG capsule Take 1 capsule (500 mg total) by mouth 3 (three) times daily. (Patient not taking: No sig reported) 21 capsule 0  . cholecalciferol (VITAMIN D3) 25 MCG (1000 UNIT) tablet Take 1,000 Units by mouth daily. (Patient not taking: No sig reported)    . cyclobenzaprine (FLEXERIL) 5 MG tablet Take 1 tablet (5 mg total) by mouth 3 (three) times daily as needed for muscle spasms. (Patient not taking: Reported on 11/25/2020) 30 tablet 0  . metFORMIN (GLUCOPHAGE) 500 MG tablet TAKE 1 TABLET BY MOUTH 2 TIMES DAILY WITH A MEAL. 60 tablet 0  . oxyCODONE-acetaminophen (PERCOCET/ROXICET) 5-325 MG tablet Take 1 tablet by mouth every 8 (eight) hours as needed for moderate pain. (Patient not taking: Reported on 11/25/2020) 20 tablet 0   No current facility-administered medications for this visit.    REVIEW OF SYSTEMS:  [X]  denotes positive finding, [ ]  denotes negative finding Cardiac  Comments:  Chest pain or chest  pressure:    Shortness of breath upon exertion:    Short of breath when lying flat:    Irregular heart rhythm:        Vascular    Pain in calf, thigh, or hip brought on by ambulation:    Pain in feet at night that wakes you up from your sleep:     Blood clot in your veins:    Leg swelling:         Pulmonary    Oxygen at home:    Productive cough:     Wheezing:         Neurologic    Sudden weakness in arms or legs:     Sudden  numbness in arms or legs:     Sudden onset of difficulty speaking or slurred speech:    Temporary loss of vision in one eye:     Problems with dizziness:         Gastrointestinal    Blood in stool:     Vomited blood:         Genitourinary    Burning when urinating:     Blood in urine:        Psychiatric    Major depression:         Hematologic    Bleeding problems:    Problems with blood clotting too easily:        Skin    Rashes or ulcers:        Constitutional    Fever or chills:      PHYSICAL EXAM: Vitals:   11/25/20 1524  BP: (!) 158/94  Pulse: 67  Resp: 18  Temp: 98.2 F (36.8 C)  TempSrc: Temporal  SpO2: 98%  Weight: 230 lb (104.3 kg)  Height: 6' (1.829 m)    GENERAL: The patient is a well-nourished male, in no acute distress. The vital signs are documented above. CARDIAC: There is a regular rate and rhythm.  VASCULAR:  Left BKA pictured below, small sinus with minimal clear fluid draining middle of incision        DATA:   None  Assessment/Plan:  60 year old male with critical limb ischemia of the left lower extremity now status post BKA on 10/03/20.  Overall the wound continues to make progress except for a small area in the midportion of the incision where there is a small sinus.  I have expressed my concern that this can lead to failure of the amputation if it gets infected.  The posterior flap actually looks very good.  I have instructed he start packing this small opening with Betadine soaked gauze strips and  he will continue to apply Betadine paint to the rest of the incision.  I will see him again in 2 weeks.  I did prescribe a week of Keflex for PO antibiotics.   [name], MD [facility] [place] Office: [phone]

## 2020-11-26 DIAGNOSIS — I10 Essential (primary) hypertension: Secondary | ICD-10-CM | POA: Diagnosis not present

## 2020-11-26 DIAGNOSIS — D649 Anemia, unspecified: Secondary | ICD-10-CM | POA: Diagnosis not present

## 2020-11-26 DIAGNOSIS — Z23 Encounter for immunization: Secondary | ICD-10-CM | POA: Diagnosis not present

## 2020-11-26 DIAGNOSIS — E1151 Type 2 diabetes mellitus with diabetic peripheral angiopathy without gangrene: Secondary | ICD-10-CM | POA: Diagnosis not present

## 2020-11-26 DIAGNOSIS — R7989 Other specified abnormal findings of blood chemistry: Secondary | ICD-10-CM | POA: Diagnosis not present

## 2020-11-26 DIAGNOSIS — Z1331 Encounter for screening for depression: Secondary | ICD-10-CM | POA: Diagnosis not present

## 2020-11-26 DIAGNOSIS — E782 Mixed hyperlipidemia: Secondary | ICD-10-CM | POA: Diagnosis not present

## 2020-12-09 ENCOUNTER — Ambulatory Visit (INDEPENDENT_AMBULATORY_CARE_PROVIDER_SITE_OTHER): Payer: BC Managed Care – PPO | Admitting: Vascular Surgery

## 2020-12-09 ENCOUNTER — Encounter: Payer: Self-pay | Admitting: Vascular Surgery

## 2020-12-09 ENCOUNTER — Other Ambulatory Visit: Payer: Self-pay

## 2020-12-09 VITALS — BP 133/82 | HR 63 | Temp 98.2°F | Resp 18 | Ht 72.0 in | Wt 230.0 lb

## 2020-12-09 DIAGNOSIS — Z89512 Acquired absence of left leg below knee: Secondary | ICD-10-CM

## 2020-12-09 DIAGNOSIS — S81802D Unspecified open wound, left lower leg, subsequent encounter: Secondary | ICD-10-CM

## 2020-12-09 MED ORDER — OXYCODONE-ACETAMINOPHEN 5-325 MG PO TABS: 1.0000 | ORAL_TABLET | Freq: Three times a day (TID) | ORAL | 0 refills | Status: DC | PRN

## 2020-12-09 NOTE — Progress Notes (Signed)
Patient name: Anthony Park MRN: [id number] DOB: 1961-01-16 Sex: male  REASON FOR VISIT: 2 week wound check s/p left BKA  HPI: Anthony Park is a 61 y.o. male who presents for 2 week follow-up for interval wound check of left BKA that was performed on 10/03/2020. He has a complex history and previously had a left popliteal stent at [facility] for popliteal aneurysm. We saw him with tissue loss and ultimately he had a left peroneal angioplasty on 08/06/2020 with tibial and small vessel disease. Ultimately required a TMA on 08/20/2020 and then TMA debridement and ultimately this became nonhealing and necrotic. He was eventually converted to a BKA.  Wound continues to make progress today.  No immediate concerns.  Packing small opening anteriorly with betadine strip.  No fevers.  No significant drainage.  Past Medical History:  Diagnosis Date  . Arthritis   . Diabetes mellitus without complication (HCC)    type 2 controlled with diet   . Diabetic toe ulcer (HCC) 11/05/2018  . History of kidney stones    passed 1 several years ago  . Hyperlipidemia   . Hypertension    not on medications  . Peripheral vascular disease (HCC)    stent in left leg due to anerysm     Past Surgical History:  Procedure Laterality Date  .  Left Transmetatarsal Ampuation (Left Foot)  08/20/2020  . ABDOMINAL AORTOGRAM W/LOWER EXTREMITY N/A 08/06/2020   Procedure: ABDOMINAL AORTOGRAM W/LOWER EXTREMITY;  Surgeon: [name], MD;  [facility];  Service: Cardiovascular;  Laterality: N/A;  . AMPUTATION Right 11/07/2018   Procedure: RIGHT 3RD TOE AMPUTATION;  Surgeon: Nadara Mustard, MD;  Location: [facility] OR;  Service: Orthopedics;  Laterality: Right;  . AMPUTATION Left 08/20/2020   Procedure: Left Transmetatarsal Ampuation;  Surgeon: [name], MD;  Location: [facility] OR;  Service: Vascular;  Laterality: Left;  . AMPUTATION Left 10/03/2020   Procedure: LEFT BELOW KNEE AMPUTATION;  Surgeon:  [name], MD;  Location: [facility] OR;  Service: Vascular;  Laterality: Left;  . AMPUTATION TOE Left    big toe and one next to it  . CATARACT EXTRACTION Bilateral   . EYE SURGERY Bilateral    cataracts removed  . PERIPHERAL VASCULAR BALLOON ANGIOPLASTY Left 08/06/2020   Procedure: PERIPHERAL VASCULAR BALLOON ANGIOPLASTY;  Surgeon: [name], MD;  [facility];  Service: Cardiovascular;  Laterality: Left;  Peroneal artery.  . TOE AMPUTATION Left 2015  . TOTAL HIP ARTHROPLASTY Left 05/23/2020   Procedure: LEFT TOTAL HIP ARTHROPLASTY ANTERIOR APPROACH;  Surgeon: Kathryne Hitch, MD;  Location: [facility]S;  Service: Orthopedics;  Laterality: Left;  . WOUND DEBRIDEMENT Left 09/11/2020   Procedure: DEBRIDEMENT OF LEFT TRANSMETATARSAL WOUND;  Surgeon: Leonie Douglas, MD;  Location: [facility] OR;  Service: Vascular;  Laterality: Left;    Family History  Problem Relation Age of Onset  . COPD Mother   . Cataracts Mother   . Brain cancer Father   . Hypertension Brother     SOCIAL HISTORY: Social History   Tobacco Use  . Smoking status: Former Smoker    Packs/day: 0.50    Years: 41.00    Pack years: 20.50    Types: Cigarettes    Quit date: 07/20/2020    Years since quitting: 0.3  . Smokeless tobacco: Never Used  Substance Use Topics  . Alcohol use: Yes    Alcohol/week: 14.0 standard drinks    Types: 14  Standard drinks or equivalent per week    Allergies  Allergen Reactions  . Meloxicam Nausea Only    Upset stomach     Current Outpatient Medications  Medication Sig Dispense Refill  . acetaminophen (TYLENOL) 500 MG tablet Take 1,000 mg by mouth every 6 (six) hours as needed for moderate pain.    Marland Kitchen aspirin EC 81 MG tablet Take 1 tablet (81 mg total) by mouth 2 (two) times daily after a meal. Swallow whole. (Patient taking differently: Take 81 mg by mouth daily. Swallow whole.) 30 tablet 0  . atorvastatin (LIPITOR) 10 MG tablet Take 1 tablet (10 mg  total) by mouth daily. 30 tablet 11  . clopidogrel (PLAVIX) 75 MG tablet Take 1 tablet (75 mg total) by mouth daily. 30 tablet 11  . cyclobenzaprine (FLEXERIL) 5 MG tablet Take 1 tablet (5 mg total) by mouth 3 (three) times daily as needed for muscle spasms. 30 tablet 0  . folic acid (FOLVITE) 1 MG tablet Take 1 tablet (1 mg total) by mouth daily. 30 tablet 1  . gabapentin (NEURONTIN) 300 MG capsule Take 1 capsule (300 mg total) by mouth at bedtime. 30 capsule 1  . lisinopril (ZESTRIL) 40 MG tablet Take 1 tablet (40 mg total) by mouth daily. 30 tablet 0  . metFORMIN (GLUCOPHAGE) 500 MG tablet TAKE 1 TABLET BY MOUTH 2 TIMES DAILY WITH A MEAL. 60 tablet 0  . methocarbamol (ROBAXIN) 500 MG tablet Take 1 tablet (500 mg total) by mouth every 8 (eight) hours as needed for muscle spasms. 30 tablet 0  . polyethylene glycol (MIRALAX / GLYCOLAX) 17 g packet Take 17 g by mouth daily as needed for mild constipation. 14 each 0  . senna (SENOKOT) 8.6 MG TABS tablet Take 2 tablets (17.2 mg total) by mouth at bedtime. 120 tablet 0  . ascorbic acid (VITAMIN C) 500 MG tablet Take 500 mg by mouth daily. (Patient not taking: No sig reported)    . cephALEXin (KEFLEX) 500 MG capsule Take 1 capsule (500 mg total) by mouth 3 (three) times daily. (Patient not taking: Reported on 12/09/2020) 21 capsule 0  . cholecalciferol (VITAMIN D3) 25 MCG (1000 UNIT) tablet Take 1,000 Units by mouth daily. (Patient not taking: No sig reported)    . oxyCODONE-acetaminophen (PERCOCET/ROXICET) 5-325 MG tablet Take 1 tablet by mouth every 8 (eight) hours as needed (breakthrough pain). 20 tablet 0   No current facility-administered medications for this visit.    REVIEW OF SYSTEMS:  [X]  denotes positive finding, [ ]  denotes negative finding Cardiac  Comments:  Chest pain or chest pressure:    Shortness of breath upon exertion:    Short of breath when lying flat:    Irregular heart rhythm:        Vascular    Pain in calf, thigh, or hip  brought on by ambulation:    Pain in feet at night that wakes you up from your sleep:     Blood clot in your veins:    Leg swelling:         Pulmonary    Oxygen at home:    Productive cough:     Wheezing:         Neurologic    Sudden weakness in arms or legs:     Sudden numbness in arms or legs:     Sudden onset of difficulty speaking or slurred speech:    Temporary loss of vision in one eye:     Problems  with dizziness:         Gastrointestinal    Blood in stool:     Vomited blood:         Genitourinary    Burning when urinating:     Blood in urine:        Psychiatric    Major depression:         Hematologic    Bleeding problems:    Problems with blood clotting too easily:        Skin    Rashes or ulcers:        Constitutional    Fever or chills:      PHYSICAL EXAM: Vitals:   12/09/20 1115  BP: 133/82  Pulse: 63  Resp: 18  Temp: 98.2 F (36.8 C)  TempSrc: Temporal  SpO2: 96%  Weight: 230 lb (104.3 kg)  Height: 6' (1.829 m)    GENERAL: The patient is a well-nourished male, in no acute distress. The vital signs are documented above. CARDIAC: There is a regular rate and rhythm.  VASCULAR:  Left BKA pictured below, small opening anteriorly, no signs of infection, healing          DATA:   None  Assessment/Plan:  60 year old male with critical limb ischemia of the left lower extremity now status post BKA on 10/03/20.  Today is a 2-week interval follow-up and overall his BKA stump continues to make progress.  No active signs of infection.  He has a small opening on the anterior incision that recommend continue packing with Betadine strips and then putting Betadine on the remaining incision.  Although slow progress it seems to be healing. Will see him again in 2 weeks.   [name], MD [facility] [place] Office: [phone]

## 2020-12-23 ENCOUNTER — Ambulatory Visit: Payer: BC Managed Care – PPO | Admitting: Vascular Surgery

## 2020-12-23 ENCOUNTER — Other Ambulatory Visit: Payer: Self-pay

## 2020-12-23 ENCOUNTER — Encounter: Payer: Self-pay | Admitting: Vascular Surgery

## 2020-12-23 VITALS — BP 133/74 | HR 66 | Temp 98.1°F | Resp 18 | Ht 72.0 in | Wt 230.0 lb

## 2020-12-23 DIAGNOSIS — I739 Peripheral vascular disease, unspecified: Secondary | ICD-10-CM

## 2020-12-23 MED ORDER — OXYCODONE-ACETAMINOPHEN 5-325 MG PO TABS: 1.0000 | ORAL_TABLET | Freq: Three times a day (TID) | ORAL | 0 refills | Status: DC | PRN

## 2020-12-23 MED ORDER — GABAPENTIN 300 MG PO CAPS
600.0000 mg | ORAL_CAPSULE | Freq: Two times a day (BID) | ORAL | 2 refills | Status: DC
Start: 2020-12-23 — End: 2023-03-22

## 2020-12-23 NOTE — Progress Notes (Signed)
Patient name: Anthony Park MRN: [id number] DOB: [birth year] Sex: male  REASON FOR VISIT: 2 week interval follow-up for wound check s/p left BKA  HPI: Anthony Park is a 60 y.o. male who presents for interval 2 week follow-up for interval wound check of left BKA that was performed on 10/03/2020. He has a complex history and previously had a left popliteal stent at [facility] for popliteal aneurysm. We saw him with tissue loss and ultimately he had a left peroneal angioplasty on 08/06/2020 with tibial and small vessel disease. Ultimately required a TMA on 08/20/2020 and then TMA debridement and ultimately this became nonhealing and necrotic. He was eventually converted to a BKA.   He continues to treat the incision with Betadine and also place a small Betadine packing in the opening.  No fevers, no chills, no drainage.  Having a fair amount of phantom pain currently taking 300 mg of gabapentin twice daily.  Past Medical History:  Diagnosis Date  . Arthritis   . Diabetes mellitus without complication (HCC)    type 2 controlled with diet   . Diabetic toe ulcer (HCC) 11/05/2018  . History of kidney stones    passed 1 several years ago  . Hyperlipidemia   . Hypertension    not on medications  . Peripheral vascular disease (HCC)    stent in left leg due to anerysm     Past Surgical History:  Procedure Laterality Date  .  Left Transmetatarsal Ampuation (Left Foot)  08/20/2020  . ABDOMINAL AORTOGRAM W/LOWER EXTREMITY N/A 08/06/2020   Procedure: ABDOMINAL AORTOGRAM W/LOWER EXTREMITY;  Surgeon: [name], MD;  [facility];  Service: Cardiovascular;  Laterality: N/A;  . AMPUTATION Right 11/07/2018   Procedure: RIGHT 3RD TOE AMPUTATION;  Surgeon: Nadara Mustard, MD;  Location: [facility] OR;  Service: Orthopedics;  Laterality: Right;  . AMPUTATION Left 08/20/2020   Procedure: Left Transmetatarsal Ampuation;  Surgeon: [name], MD;  Location: [facility] OR;  Service:  Vascular;  Laterality: Left;  . AMPUTATION Left 10/03/2020   Procedure: LEFT BELOW KNEE AMPUTATION;  Surgeon: [name], MD;  Location: [facility] OR;  Service: Vascular;  Laterality: Left;  . AMPUTATION TOE Left    big toe and one next to it  . CATARACT EXTRACTION Bilateral   . EYE SURGERY Bilateral    cataracts removed  . PERIPHERAL VASCULAR BALLOON ANGIOPLASTY Left 08/06/2020   Procedure: PERIPHERAL VASCULAR BALLOON ANGIOPLASTY;  Surgeon: [name], MD;  [facility];  Service: Cardiovascular;  Laterality: Left;  Peroneal artery.  . TOE AMPUTATION Left 2015  . TOTAL HIP ARTHROPLASTY Left 05/23/2020   Procedure: LEFT TOTAL HIP ARTHROPLASTY ANTERIOR APPROACH;  Surgeon: Kathryne Hitch, MD;  Location: [facility];  Service: Orthopedics;  Laterality: Left;  . WOUND DEBRIDEMENT Left 09/11/2020   Procedure: DEBRIDEMENT OF LEFT TRANSMETATARSAL WOUND;  Surgeon: Leonie Douglas, MD;  Location: [facility] OR;  Service: Vascular;  Laterality: Left;    Family History  Problem Relation Age of Onset  . COPD Mother   . Cataracts Mother   . Brain cancer Father   . Hypertension Brother     SOCIAL HISTORY: Social History   Tobacco Use  . Smoking status: Former Smoker    Packs/day: 0.50    Years: 41.00    Pack years: 20.50    Types: Cigarettes    Quit date: 07/20/2020    Years since quitting: 0.4  . Smokeless tobacco: Never Used  Substance Use Topics  . Alcohol use: Yes    Alcohol/week: 14.0 standard drinks    Types: 14 Standard drinks or equivalent per week    Allergies  Allergen Reactions  . Meloxicam Nausea Only    Upset stomach     Current Outpatient Medications  Medication Sig Dispense Refill  . acetaminophen (TYLENOL) 500 MG tablet Take 1,000 mg by mouth every 6 (six) hours as needed for moderate pain.    Marland Kitchen aspirin EC 81 MG tablet Take 1 tablet (81 mg total) by mouth 2 (two) times daily after a meal. Swallow whole. 30 tablet 0  . atorvastatin  (LIPITOR) 10 MG tablet Take 1 tablet (10 mg total) by mouth daily. 30 tablet 11  . clopidogrel (PLAVIX) 75 MG tablet Take 1 tablet (75 mg total) by mouth daily. 30 tablet 11  . cyclobenzaprine (FLEXERIL) 5 MG tablet Take 1 tablet (5 mg total) by mouth 3 (three) times daily as needed for muscle spasms. 30 tablet 0  . folic acid (FOLVITE) 1 MG tablet Take 1 tablet (1 mg total) by mouth daily. 30 tablet 1  . gabapentin (NEURONTIN) 300 MG capsule Take 1 capsule (300 mg total) by mouth at bedtime. 30 capsule 1  . lisinopril (ZESTRIL) 40 MG tablet Take 1 tablet (40 mg total) by mouth daily. 30 tablet 0  . metFORMIN (GLUCOPHAGE) 500 MG tablet TAKE 1 TABLET BY MOUTH 2 TIMES DAILY WITH A MEAL. 60 tablet 0  . methocarbamol (ROBAXIN) 500 MG tablet Take 1 tablet (500 mg total) by mouth every 8 (eight) hours as needed for muscle spasms. 30 tablet 0  . oxyCODONE-acetaminophen (PERCOCET/ROXICET) 5-325 MG tablet Take 1 tablet by mouth every 8 (eight) hours as needed (breakthrough pain). 20 tablet 0  . polyethylene glycol (MIRALAX / GLYCOLAX) 17 g packet Take 17 g by mouth daily as needed for mild constipation. 14 each 0  . senna (SENOKOT) 8.6 MG TABS tablet Take 2 tablets (17.2 mg total) by mouth at bedtime. 120 tablet 0  . ascorbic acid (VITAMIN C) 500 MG tablet Take 500 mg by mouth daily. (Patient not taking: No sig reported)    . cephALEXin (KEFLEX) 500 MG capsule Take 1 capsule (500 mg total) by mouth 3 (three) times daily. (Patient not taking: No sig reported) 21 capsule 0  . cholecalciferol (VITAMIN D3) 25 MCG (1000 UNIT) tablet Take 1,000 Units by mouth daily. (Patient not taking: No sig reported)     No current facility-administered medications for this visit.    REVIEW OF SYSTEMS:  [X]  denotes positive finding, [ ]  denotes negative finding Cardiac  Comments:  Chest pain or chest pressure:    Shortness of breath upon exertion:    Short of breath when lying flat:    Irregular heart rhythm:         Vascular    Pain in calf, thigh, or hip brought on by ambulation:    Pain in feet at night that wakes you up from your sleep:     Blood clot in your veins:    Leg swelling:         Pulmonary    Oxygen at home:    Productive cough:     Wheezing:         Neurologic    Sudden weakness in arms or legs:     Sudden numbness in arms or legs:     Sudden onset of difficulty speaking or slurred speech:    Temporary loss of  vision in one eye:     Problems with dizziness:         Gastrointestinal    Blood in stool:     Vomited blood:         Genitourinary    Burning when urinating:     Blood in urine:        Psychiatric    Major depression:         Hematologic    Bleeding problems:    Problems with blood clotting too easily:        Skin    Rashes or ulcers:        Constitutional    Fever or chills:      PHYSICAL EXAM: Vitals:   12/23/20 0926  BP: 133/74  Pulse: 66  Resp: 18  Temp: 98.1 F (36.7 C)  TempSrc: Temporal  SpO2: 98%  Weight: 230 lb (104.3 kg)  Height: 6' (1.829 m)    GENERAL: The patient is a well-nourished male, in no acute distress. The vital signs are documented above. VASCULAR:  Left BKA pictured below, slowly healing       DATA:   None  Assessment/Plan:  60 year old male with critical limb ischemia of the left lower extremity now status post BKA on 10/03/20.  Today is a 2-week interval follow-up and overall his BKA stump continues to make progress.  No active signs of infection.  He continues to put Betadine paint on the incision which I have instructed him to continue doing.  He is also putting a little bit of packing anteriorly where there is a small opening.  I will see him in 3 weeks and start stretching out his follow-up.  I did increase his gabapentin to 600 mg twice daily for phantom pain and also refilled some narcotics for him.  [name], MD [facility] [place] Office: [phone]

## 2020-12-30 ENCOUNTER — Telehealth (HOSPITAL_COMMUNITY): Payer: Self-pay

## 2020-12-30 NOTE — Telephone Encounter (Signed)
12/30/20 Patient Anthony Park called in while he was sitting in the dentist chair (Dr Lorin Picket). He needed a tooth removed, he is taking Plavix ( medication) . Dr. Chestine Spore was out of the office so I went and spoke with Trinna Post in triage, she gave the okay for the tooth to be removed. I have this approval over the phone to [facility] the assistant, then spoke with Eunice Blase the Print production planner.   [employer]

## 2021-01-13 ENCOUNTER — Encounter: Payer: Self-pay | Admitting: Vascular Surgery

## 2021-01-13 ENCOUNTER — Other Ambulatory Visit: Payer: Self-pay

## 2021-01-13 ENCOUNTER — Ambulatory Visit (INDEPENDENT_AMBULATORY_CARE_PROVIDER_SITE_OTHER): Payer: BC Managed Care – PPO | Admitting: Vascular Surgery

## 2021-01-13 VITALS — BP 157/84 | HR 68 | Temp 98.0°F | Resp 18 | Ht 72.0 in | Wt 230.0 lb

## 2021-01-13 DIAGNOSIS — I739 Peripheral vascular disease, unspecified: Secondary | ICD-10-CM | POA: Diagnosis not present

## 2021-01-13 MED ORDER — OXYCODONE-ACETAMINOPHEN 5-325 MG PO TABS: 1.0000 | ORAL_TABLET | Freq: Three times a day (TID) | ORAL | 0 refills | Status: DC | PRN

## 2021-01-13 NOTE — Progress Notes (Signed)
Patient name: Anthony Park MRN: [id number] DOB: 1961/03/18 Sex: male  REASON FOR VISIT: 3 week interval follow-up for wound check s/p left BKA  HPI: Anthony Park is a 60 y.o. male who presents for interval 23week follow-up for interval wound check of left BKA that was performed on 10/03/2020. He has a complex history and previously had a left popliteal stent at [facility] for popliteal aneurysm. We saw him with tissue loss and ultimately he had a left peroneal angioplasty on 08/06/2020 with tibial and small vessel disease. Ultimately required a TMA on 08/20/2020 and then TMA debridement and ultimately this became nonhealing and necrotic. He was eventually converted to a BKA.   He continues to put a small amount of packing anteriorly where there is a small opening and paints the rest of the incision with Betadine as we have instructed.  He feels the wound is continuing to make progress.  Past Medical History:  Diagnosis Date  . Arthritis   . Diabetes mellitus without complication (HCC)    type 2 controlled with diet   . Diabetic toe ulcer (HCC) 11/05/2018  . History of kidney stones    passed 1 several years ago  . Hyperlipidemia   . Hypertension    not on medications  . Peripheral vascular disease (HCC)    stent in left leg due to anerysm     Past Surgical History:  Procedure Laterality Date  .  Left Transmetatarsal Ampuation (Left Foot)  08/20/2020  . ABDOMINAL AORTOGRAM W/LOWER EXTREMITY N/A 08/06/2020   Procedure: ABDOMINAL AORTOGRAM W/LOWER EXTREMITY;  Surgeon: [name], MD;  Location: [facility];  Service: Cardiovascular;  Laterality: N/A;  . AMPUTATION Right 11/07/2018   Procedure: RIGHT 3RD TOE AMPUTATION;  Surgeon: Nadara Mustard, MD;  Location: [facility] OR;  Service: Orthopedics;  Laterality: Right;  . AMPUTATION Left 08/20/2020   Procedure: Left Transmetatarsal Ampuation;  Surgeon: [name], MD;  Location: [facility] OR;  Service: Vascular;  Laterality:  Left;  . AMPUTATION Left 10/03/2020   Procedure: LEFT BELOW KNEE AMPUTATION;  Surgeon: [name], MD;  Location: [facility] OR;  Service: Vascular;  Laterality: Left;  . AMPUTATION TOE Left    big toe and one next to it  . CATARACT EXTRACTION Bilateral   . EYE SURGERY Bilateral    cataracts removed  . PERIPHERAL VASCULAR BALLOON ANGIOPLASTY Left 08/06/2020   Procedure: PERIPHERAL VASCULAR BALLOON ANGIOPLASTY;  Surgeon: [name], MD;  Location: [facility];  Service: Cardiovascular;  Laterality: Left;  Peroneal artery.  . TOE AMPUTATION Left 2015  . TOTAL HIP ARTHROPLASTY Left 05/23/2020   Procedure: LEFT TOTAL HIP ARTHROPLASTY ANTERIOR APPROACH;  Surgeon: Kathryne Hitch, MD;  Location: [facility] ORS;  Service: Orthopedics;  Laterality: Left;  . WOUND DEBRIDEMENT Left 09/11/2020   Procedure: DEBRIDEMENT OF LEFT TRANSMETATARSAL WOUND;  Surgeon: Leonie Douglas, MD;  Location: [facility] OR;  Service: Vascular;  Laterality: Left;    Family History  Problem Relation Age of Onset  . COPD Mother   . Cataracts Mother   . Brain cancer Father   . Hypertension Brother     SOCIAL HISTORY: Social History   Tobacco Use  . Smoking status: Former Smoker    Packs/day: 0.50    Years: 41.00    Pack years: 20.50    Types: Cigarettes    Quit date: 07/20/2020    Years since quitting: 0.4  . Smokeless tobacco: Never Used  Substance Use  Topics  . Alcohol use: Yes    Alcohol/week: 14.0 standard drinks    Types: 14 Standard drinks or equivalent per week    Allergies  Allergen Reactions  . Meloxicam Nausea Only    Upset stomach     Current Outpatient Medications  Medication Sig Dispense Refill  . acetaminophen (TYLENOL) 500 MG tablet Take 1,000 mg by mouth every 6 (six) hours as needed for moderate pain.    Marland Kitchen aspirin EC 81 MG tablet Take 1 tablet (81 mg total) by mouth 2 (two) times daily after a meal. Swallow whole. 30 tablet 0  . clopidogrel (PLAVIX) 75 MG tablet Take 1  tablet (75 mg total) by mouth daily. 30 tablet 11  . cyclobenzaprine (FLEXERIL) 5 MG tablet Take 1 tablet (5 mg total) by mouth 3 (three) times daily as needed for muscle spasms. 30 tablet 0  . folic acid (FOLVITE) 1 MG tablet Take 1 tablet (1 mg total) by mouth daily. 30 tablet 1  . gabapentin (NEURONTIN) 300 MG capsule Take 2 capsules (600 mg total) by mouth 2 (two) times daily. 120 capsule 2  . lisinopril (ZESTRIL) 40 MG tablet Take 1 tablet (40 mg total) by mouth daily. 30 tablet 0  . metFORMIN (GLUCOPHAGE) 500 MG tablet TAKE 1 TABLET BY MOUTH 2 TIMES DAILY WITH A MEAL. 60 tablet 0  . methocarbamol (ROBAXIN) 500 MG tablet Take 1 tablet (500 mg total) by mouth every 8 (eight) hours as needed for muscle spasms. 30 tablet 0  . ascorbic acid (VITAMIN C) 500 MG tablet Take 500 mg by mouth daily. (Patient not taking: No sig reported)    . atorvastatin (LIPITOR) 10 MG tablet Take 1 tablet (10 mg total) by mouth daily. (Patient not taking: Reported on 01/13/2021) 30 tablet 11  . cephALEXin (KEFLEX) 500 MG capsule Take 1 capsule (500 mg total) by mouth 3 (three) times daily. (Patient not taking: No sig reported) 21 capsule 0  . cholecalciferol (VITAMIN D3) 25 MCG (1000 UNIT) tablet Take 1,000 Units by mouth daily. (Patient not taking: No sig reported)    . oxyCODONE-acetaminophen (PERCOCET/ROXICET) 5-325 MG tablet Take 1 tablet by mouth every 8 (eight) hours as needed (breakthrough pain). (Patient not taking: Reported on 01/13/2021) 20 tablet 0  . polyethylene glycol (MIRALAX / GLYCOLAX) 17 g packet Take 17 g by mouth daily as needed for mild constipation. (Patient not taking: Reported on 01/13/2021) 14 each 0  . senna (SENOKOT) 8.6 MG TABS tablet Take 2 tablets (17.2 mg total) by mouth at bedtime. (Patient not taking: Reported on 01/13/2021) 120 tablet 0   No current facility-administered medications for this visit.    REVIEW OF SYSTEMS:  [X]  denotes positive finding, [ ]  denotes negative finding Cardiac   Comments:  Chest pain or chest pressure:    Shortness of breath upon exertion:    Short of breath when lying flat:    Irregular heart rhythm:        Vascular    Pain in calf, thigh, or hip brought on by ambulation:    Pain in feet at night that wakes you up from your sleep:     Blood clot in your veins:    Leg swelling:         Pulmonary    Oxygen at home:    Productive cough:     Wheezing:         Neurologic    Sudden weakness in arms or legs:  Sudden numbness in arms or legs:     Sudden onset of difficulty speaking or slurred speech:    Temporary loss of vision in one eye:     Problems with dizziness:         Gastrointestinal    Blood in stool:     Vomited blood:         Genitourinary    Burning when urinating:     Blood in urine:        Psychiatric    Major depression:         Hematologic    Bleeding problems:    Problems with blood clotting too easily:        Skin    Rashes or ulcers:        Constitutional    Fever or chills:      PHYSICAL EXAM: Vitals:   01/13/21 0840  BP: (!) 157/84  Pulse: 68  Resp: 18  Temp: 98 F (36.7 C)  TempSrc: Temporal  SpO2: 98%  Weight: 230 lb (104.3 kg)  Height: 6' (1.829 m)    GENERAL: The patient is a well-nourished male, in no acute distress. The vital signs are documented above. VASCULAR:  Left BKA pictured below, continues to improve         DATA:   None  Assessment/Plan:  60 year old male with critical limb ischemia of the left lower extremity now status post BKA on 10/03/20.  The wound continues to make steady but slow progress as pictured above.  I have given him a prescription for biotech today to see about getting him in a stump shrinker and then moving toward a prosthesis in the future.  I will have him see me again in 1 month for continued surveillance of this wound and I am hopeful that this will be nearly healed at that point.  Did refill some oxycodone for him today that he is using on a  limited basis.  [name], MD [facility] Office: [phone]

## 2021-01-25 ENCOUNTER — Encounter (HOSPITAL_COMMUNITY): Payer: Self-pay

## 2021-01-25 ENCOUNTER — Other Ambulatory Visit: Payer: Self-pay

## 2021-01-25 ENCOUNTER — Emergency Department (HOSPITAL_COMMUNITY)
Admission: EM | Admit: 2021-01-25 | Discharge: 2021-01-25 | Disposition: A | Discharge status: Home / Self Care | Payer: BC Managed Care – PPO | Attending: Emergency Medicine | Admitting: Emergency Medicine

## 2021-01-25 DIAGNOSIS — E1151 Type 2 diabetes mellitus with diabetic peripheral angiopathy without gangrene: Secondary | ICD-10-CM | POA: Diagnosis not present

## 2021-01-25 DIAGNOSIS — I10 Essential (primary) hypertension: Secondary | ICD-10-CM | POA: Insufficient documentation

## 2021-01-25 DIAGNOSIS — Z7984 Long term (current) use of oral hypoglycemic drugs: Secondary | ICD-10-CM | POA: Insufficient documentation

## 2021-01-25 DIAGNOSIS — Z87891 Personal history of nicotine dependence: Secondary | ICD-10-CM | POA: Diagnosis not present

## 2021-01-25 DIAGNOSIS — Z79899 Other long term (current) drug therapy: Secondary | ICD-10-CM | POA: Insufficient documentation

## 2021-01-25 DIAGNOSIS — T8781 Dehiscence of amputation stump: Secondary | ICD-10-CM | POA: Diagnosis not present

## 2021-01-25 DIAGNOSIS — Z7901 Long term (current) use of anticoagulants: Secondary | ICD-10-CM | POA: Diagnosis not present

## 2021-01-25 DIAGNOSIS — T8131XA Disruption of external operation (surgical) wound, not elsewhere classified, initial encounter: Secondary | ICD-10-CM | POA: Diagnosis not present

## 2021-01-25 DIAGNOSIS — T8133XA Disruption of traumatic injury wound repair, initial encounter: Secondary | ICD-10-CM | POA: Diagnosis not present

## 2021-01-25 DIAGNOSIS — W050XXA Fall from non-moving wheelchair, initial encounter: Secondary | ICD-10-CM | POA: Diagnosis not present

## 2021-01-25 DIAGNOSIS — Z89512 Acquired absence of left leg below knee: Secondary | ICD-10-CM | POA: Diagnosis not present

## 2021-01-25 DIAGNOSIS — S80212A Abrasion, left knee, initial encounter: Secondary | ICD-10-CM | POA: Diagnosis not present

## 2021-01-25 MED ORDER — LIDOCAINE HCL (PF) 1 % IJ SOLN
10.0000 mL | Freq: Once | INTRAMUSCULAR | Status: DC
  Filled 2021-01-25: qty 10

## 2021-01-25 MED ORDER — OXYCODONE HCL 5 MG PO TABS: 2.5000 mg | ORAL_TABLET | Freq: Four times a day (QID) | ORAL | 0 refills | Status: DC | PRN

## 2021-01-25 NOTE — ED Triage Notes (Signed)
Pt from home came in POV for bleeding at BKA on left leg. Pt was transferring from wheelchair and fell on left leg. Scabs from amputation site open and are bleeding.Tylenol taken PTA.

## 2021-01-25 NOTE — ED Provider Notes (Signed)
[facility] EMERGENCY DEPARTMENT Provider Note   CSN: [id number] Arrival date & time: 01/25/21  0844     History No chief complaint on file.   Anthony Park is a 60 y.o. male with a past medical history of diabetes, peripheral vascular disease, hypertension, hyperlipidemia who is status post left BKA who presents for wound dehiscence.  Patient states that he was transferring from his wheelchair and fell onto his left stump.  There was some opening along the suture line and he has mild active bleeding.  He is currently taking Plavix and aspirin.  He denies any severe or significant blood loss.  He has some moderate pain in the leg.  He is taken Tylenol for pain relief.  Patient's surgery was about 4 months ago  HPI     Past Medical History:  Diagnosis Date  . Arthritis   . Diabetes mellitus without complication (HCC)    type 2 controlled with diet   . Diabetic toe ulcer (HCC) 11/05/2018  . History of kidney stones    passed 1 several years ago  . Hyperlipidemia   . Hypertension    not on medications  . Peripheral vascular disease (HCC)    stent in left leg due to anerysm     Patient Active Problem List   Diagnosis Date Noted  . S/P BKA (below knee amputation) unilateral, left (HCC) 10/03/2020  . Non-healing wound of lower extremity 10/03/2020  . History of amputation of left foot through metatarsal bone (HCC) 09/11/2020  . PAD (peripheral artery disease) (HCC) 08/20/2020  . Status post total replacement of left hip 06/05/2020  . Unilateral primary osteoarthritis, right hip 04/17/2020  . Unilateral primary osteoarthritis, left hip 04/17/2020  . Amputated toe, right (HCC) 11/22/2018  . Type 2 diabetes mellitus with foot ulcer, without long-term current use of insulin (HCC) 11/22/2018  . Essential hypertension 11/22/2018  . Mixed hyperlipidemia 11/22/2018  . Morbid obesity (HCC) 11/22/2018  . Aneurysm of left popliteal artery (HCC) 11/22/2018  . Status  post peripheral artery angioplasty with insertion of stent 11/22/2018  . Encounter for smoking cessation counseling 11/22/2018  . Osteomyelitis of third toe of right foot (HCC)   . Cellulitis of third toe of right foot     Past Surgical History:  Procedure Laterality Date  .  Left Transmetatarsal Ampuation (Left Foot)  08/20/2020  . ABDOMINAL AORTOGRAM W/LOWER EXTREMITY N/A 08/06/2020   Procedure: ABDOMINAL AORTOGRAM W/LOWER EXTREMITY;  Surgeon: [name], MD;  Location: [facility];  Service: Cardiovascular;  Laterality: N/A;  . AMPUTATION Right 11/07/2018   Procedure: RIGHT 3RD TOE AMPUTATION;  Surgeon: Nadara Mustard, MD;  Location: [facility] OR;  Service: Orthopedics;  Laterality: Right;  . AMPUTATION Left 08/20/2020   Procedure: Left Transmetatarsal Ampuation;  Surgeon: [name], MD;  Location: [facility] OR;  Service: Vascular;  Laterality: Left;  . AMPUTATION Left 10/03/2020   Procedure: LEFT BELOW KNEE AMPUTATION;  Surgeon: [name], MD;  Location: [facility] OR;  Service: Vascular;  Laterality: Left;  . AMPUTATION TOE Left    big toe and one next to it  . CATARACT EXTRACTION Bilateral   . EYE SURGERY Bilateral    cataracts removed  . PERIPHERAL VASCULAR BALLOON ANGIOPLASTY Left 08/06/2020   Procedure: PERIPHERAL VASCULAR BALLOON ANGIOPLASTY;  Surgeon: [name], MD;  Location: [facility];  Service: Cardiovascular;  Laterality: Left;  Peroneal artery.  . TOE AMPUTATION Left 2015  . TOTAL HIP  ARTHROPLASTY Left 05/23/2020   Procedure: LEFT TOTAL HIP ARTHROPLASTY ANTERIOR APPROACH;  Surgeon: Kathryne Hitch, MD;  Location: [facility] ORS;  Service: Orthopedics;  Laterality: Left;  . WOUND DEBRIDEMENT Left 09/11/2020   Procedure: DEBRIDEMENT OF LEFT TRANSMETATARSAL WOUND;  Surgeon: Leonie Douglas, MD;  Location: [facility] OR;  Service: Vascular;  Laterality: Left;       Family History  Problem Relation Age of Onset  . COPD Mother   . Cataracts  Mother   . Brain cancer Father   . Hypertension Brother     Social History   Tobacco Use  . Smoking status: Former Smoker    Packs/day: 0.50    Years: 41.00    Pack years: 20.50    Types: Cigarettes    Quit date: 07/20/2020    Years since quitting: 0.5  . Smokeless tobacco: Never Used  Vaping Use  . Vaping Use: Never used  Substance Use Topics  . Alcohol use: Yes    Alcohol/week: 14.0 standard drinks    Types: 14 Standard drinks or equivalent per week  . Drug use: Not Currently    Comment: hx of marijuana 15 years ago     Home Medications Prior to Admission medications   Medication Sig Start Date End Date Taking? Authorizing Provider  acetaminophen (TYLENOL) 500 MG tablet Take 1,000 mg by mouth every 6 (six) hours as needed for moderate pain.    [provider]  ascorbic acid (VITAMIN C) 500 MG tablet Take 500 mg by mouth daily. Patient not taking: No sig reported    [provider]  aspirin EC 81 MG tablet Take 1 tablet (81 mg total) by mouth 2 (two) times daily after a meal. Swallow whole. 05/22/20   Kathryne Hitch, MD  atorvastatin (LIPITOR) 10 MG tablet Take 1 tablet (10 mg total) by mouth daily. Patient not taking: Reported on 01/13/2021 08/06/20 08/06/21  [name], MD  cephALEXin (KEFLEX) 500 MG capsule Take 1 capsule (500 mg total) by mouth 3 (three) times daily. Patient not taking: No sig reported 11/25/20   [name], MD  cholecalciferol (VITAMIN D3) 25 MCG (1000 UNIT) tablet Take 1,000 Units by mouth daily. Patient not taking: No sig reported    [provider]  clopidogrel (PLAVIX) 75 MG tablet Take 1 tablet (75 mg total) by mouth daily. 08/06/20 08/06/21  [name], MD  cyclobenzaprine (FLEXERIL) 5 MG tablet Take 1 tablet (5 mg total) by mouth 3 (three) times daily as needed for muscle spasms. 11/25/20   [name], MD  folic acid (FOLVITE) 1 MG tablet Take 1 tablet (1 mg total) by mouth  daily. 10/07/20   Osvaldo Shipper, MD  gabapentin (NEURONTIN) 300 MG capsule Take 2 capsules (600 mg total) by mouth 2 (two) times daily. 12/23/20   [name], MD  lisinopril (ZESTRIL) 40 MG tablet Take 1 tablet (40 mg total) by mouth daily. 10/23/20   Rhyne, Ames Coupe, PA-C  metFORMIN (GLUCOPHAGE) 500 MG tablet TAKE 1 TABLET BY MOUTH 2 TIMES DAILY WITH A MEAL. 10/28/20   Maeola Harman, MD  methocarbamol (ROBAXIN) 500 MG tablet Take 1 tablet (500 mg total) by mouth every 8 (eight) hours as needed for muscle spasms. 10/31/20   Emilie Rutter, PA-C  oxyCODONE-acetaminophen (PERCOCET/ROXICET) 5-325 MG tablet Take 1 tablet by mouth every 8 (eight) hours as needed (breakthrough pain). 01/13/21   [name], MD  polyethylene glycol (MIRALAX / GLYCOLAX) 17 g packet  Take 17 g by mouth daily as needed for mild constipation. Patient not taking: Reported on 01/13/2021 10/06/20   Osvaldo Shipper, MD  senna (SENOKOT) 8.6 MG TABS tablet Take 2 tablets (17.2 mg total) by mouth at bedtime. Patient not taking: Reported on 01/13/2021 10/06/20   Osvaldo Shipper, MD    Allergies    Meloxicam  Review of Systems   Review of Systems Ten systems reviewed and are negative for acute change, except as noted in the HPI.   Physical Exam Updated Vital Signs There were no vitals taken for this visit.  Physical Exam Vitals and nursing note reviewed.  Constitutional:      General: He is not in acute distress.    Appearance: He is well-developed. He is not diaphoretic.  HENT:     Head: Normocephalic and atraumatic.  Eyes:     General: No scleral icterus.    Conjunctiva/sclera: Conjunctivae normal.  Cardiovascular:     Rate and Rhythm: Normal rate and regular rhythm.     Heart sounds: Normal heart sounds.  Pulmonary:     Effort: Pulmonary effort is normal. No respiratory distress.     Breath sounds: Normal breath sounds.  Abdominal:     Palpations: Abdomen is soft.     Tenderness: There  is no abdominal tenderness.  Musculoskeletal:     Cervical back: Normal range of motion and neck supple.     Comments: Left BKA with approximately 4 cm of dehiscence along the surgical suture line.  There is mild oozing of blood from the wound.  There is a small abrasion over the stump.  Skin:    General: Skin is warm and dry.  Neurological:     Mental Status: He is alert.  Psychiatric:        Behavior: Behavior normal.     ED Results / Procedures / Treatments   Labs (all labs ordered are listed, but only abnormal results are displayed) Labs Reviewed - No data to display  EKG None  Radiology No results found.  Procedures Procedures   Medications Ordered in ED Medications - No data to display  ED Course  I have reviewed the triage vital signs and the nursing notes.  Pertinent labs & imaging results that were available during my care of the patient were reviewed by me and considered in my medical decision making (see chart for details).    MDM Rules/Calculators/A&P                          Patient here with traumatic wound dehiscence of left BKA.  Seen at bedside by vascular surgery who packed and wrapped the wound.  He was given instructions for home care and follow-up and has an appointment with vascular surgery next week.  Vascular is asked me to give him some pain medication, I reviewed the PDMP and prescribed oxycodone.  I discussed return precautions with the patient he appears otherwise appropriate for discharge. Final Clinical Impression(s) / ED Diagnoses Final diagnoses:  None    Rx / DC Orders ED Discharge Orders    None       Arthor Captain, PA-C 01/25/21 1026    Benjiman Core, MD 01/26/21 1453

## 2021-01-25 NOTE — Consult Note (Signed)
Hospital Consult    Reason for Consult:  Below knee amputation wound  Referring Physician:  Dr. Rubin Payor MRN #:  [id number]  History of Present Illness: This is a 60 y.o. male history of transmetatarsal amputation that was slow to heal subsequently underwent left below-knee amputation.  This has had some difficulty healing as well.  He was recently ordered a stump sock states that this was doing well.  This morning he fell on concrete using a walker outside of his house.  He has had bleeding since then.  He has significant pain.  He now presents for further evaluation after calling into the office this morning.  He does not take blood thinners he is on aspirin and Plavix.  Past Medical History:  Diagnosis Date  . Arthritis   . Diabetes mellitus without complication (HCC)    type 2 controlled with diet   . Diabetic toe ulcer (HCC) 11/05/2018  . History of kidney stones    passed 1 several years ago  . Hyperlipidemia   . Hypertension    not on medications  . Peripheral vascular disease (HCC)    stent in left leg due to anerysm     Past Surgical History:  Procedure Laterality Date  .  Left Transmetatarsal Ampuation (Left Foot)  08/20/2020  . ABDOMINAL AORTOGRAM W/LOWER EXTREMITY N/A 08/06/2020   Procedure: ABDOMINAL AORTOGRAM W/LOWER EXTREMITY;  Surgeon: [name], MD;  Location: [facility];  Service: Cardiovascular;  Laterality: N/A;  . AMPUTATION Right 11/07/2018   Procedure: RIGHT 3RD TOE AMPUTATION;  Surgeon: Nadara Mustard, MD;  Location: [facility] OR;  Service: Orthopedics;  Laterality: Right;  . AMPUTATION Left 08/20/2020   Procedure: Left Transmetatarsal Ampuation;  Surgeon: [name], MD;  Location: [facility] OR;  Service: Vascular;  Laterality: Left;  . AMPUTATION Left 10/03/2020   Procedure: LEFT BELOW KNEE AMPUTATION;  Surgeon: [name], MD;  Location: [facility] OR;  Service: Vascular;  Laterality: Left;  . AMPUTATION TOE Left    big toe and one next  to it  . CATARACT EXTRACTION Bilateral   . EYE SURGERY Bilateral    cataracts removed  . PERIPHERAL VASCULAR BALLOON ANGIOPLASTY Left 08/06/2020   Procedure: PERIPHERAL VASCULAR BALLOON ANGIOPLASTY;  Surgeon: [name], MD;  Location: [facility];  Service: Cardiovascular;  Laterality: Left;  Peroneal artery.  . TOE AMPUTATION Left 2015  . TOTAL HIP ARTHROPLASTY Left 05/23/2020   Procedure: LEFT TOTAL HIP ARTHROPLASTY ANTERIOR APPROACH;  Surgeon: Kathryne Hitch, MD;  Location: [facility] ORS;  Service: Orthopedics;  Laterality: Left;  . WOUND DEBRIDEMENT Left 09/11/2020   Procedure: DEBRIDEMENT OF LEFT TRANSMETATARSAL WOUND;  Surgeon: Leonie Douglas, MD;  Location: [facility] OR;  Service: Vascular;  Laterality: Left;    Allergies  Allergen Reactions  . Meloxicam Nausea Only    Upset stomach     Prior to Admission medications   Medication Sig Start Date End Date Taking? Authorizing Provider  acetaminophen (TYLENOL) 500 MG tablet Take 1,000 mg by mouth every 6 (six) hours as needed for moderate pain.    [provider]  ascorbic acid (VITAMIN C) 500 MG tablet Take 500 mg by mouth daily. Patient not taking: No sig reported    [provider]  aspirin EC 81 MG tablet Take 1 tablet (81 mg total) by mouth 2 (two) times daily after a meal. Swallow whole. 05/22/20   Kathryne Hitch, MD  atorvastatin (LIPITOR) 10 MG tablet Take 1  tablet (10 mg total) by mouth daily. Patient not taking: Reported on 01/13/2021 08/06/20 08/06/21  [name], MD  cephALEXin (KEFLEX) 500 MG capsule Take 1 capsule (500 mg total) by mouth 3 (three) times daily. Patient not taking: No sig reported 11/25/20   [name], MD  cholecalciferol (VITAMIN D3) 25 MCG (1000 UNIT) tablet Take 1,000 Units by mouth daily. Patient not taking: No sig reported    [provider]  clopidogrel (PLAVIX) 75 MG tablet Take 1 tablet (75 mg total) by mouth daily. 08/06/20  08/06/21  [name], MD  cyclobenzaprine (FLEXERIL) 5 MG tablet Take 1 tablet (5 mg total) by mouth 3 (three) times daily as needed for muscle spasms. 11/25/20   [name], MD  folic acid (FOLVITE) 1 MG tablet Take 1 tablet (1 mg total) by mouth daily. 10/07/20   Osvaldo Shipper, MD  gabapentin (NEURONTIN) 300 MG capsule Take 2 capsules (600 mg total) by mouth 2 (two) times daily. 12/23/20   [name], MD  lisinopril (ZESTRIL) 40 MG tablet Take 1 tablet (40 mg total) by mouth daily. 10/23/20   Rhyne, Ames Coupe, PA-C  metFORMIN (GLUCOPHAGE) 500 MG tablet TAKE 1 TABLET BY MOUTH 2 TIMES DAILY WITH A MEAL. 10/28/20   Maeola Harman, MD  methocarbamol (ROBAXIN) 500 MG tablet Take 1 tablet (500 mg total) by mouth every 8 (eight) hours as needed for muscle spasms. 10/31/20   Emilie Rutter, PA-C  oxyCODONE-acetaminophen (PERCOCET/ROXICET) 5-325 MG tablet Take 1 tablet by mouth every 8 (eight) hours as needed (breakthrough pain). 01/13/21   [name], MD  polyethylene glycol (MIRALAX / GLYCOLAX) 17 g packet Take 17 g by mouth daily as needed for mild constipation. Patient not taking: Reported on 01/13/2021 10/06/20   Osvaldo Shipper, MD  senna (SENOKOT) 8.6 MG TABS tablet Take 2 tablets (17.2 mg total) by mouth at bedtime. Patient not taking: Reported on 01/13/2021 10/06/20   Osvaldo Shipper, MD    Social History   Socioeconomic History  . Marital status: Single    Spouse name: [name]  . Number of children: 3  . Years of education: 34  . Highest education level: Not on file  Occupational History  . Not on file  Tobacco Use  . Smoking status: Former Smoker    Packs/day: 0.50    Years: 41.00    Pack years: 20.50    Types: Cigarettes    Quit date: 07/20/2020    Years since quitting: 0.5  . Smokeless tobacco: Never Used  Vaping Use  . Vaping Use: Never used  Substance and Sexual Activity  . Alcohol use: Yes    Alcohol/week: 14.0 standard drinks     Types: 14 Standard drinks or equivalent per week  . Drug use: Not Currently    Comment: hx of marijuana 15 years ago   . Sexual activity: Yes  Other Topics Concern  . Not on file  Social History Narrative  . Not on file   Social Determinants of Health   Financial Resource Strain: Not on file  Food Insecurity: Not on file  Transportation Needs: Not on file  Physical Activity: Not on file  Stress: Not on file  Social Connections: Not on file  Intimate Partner Violence: Not on file     Family History  Problem Relation Age of Onset  . COPD Mother   . Cataracts Mother   . Brain cancer Father   . Hypertension Brother  ROS: Cardiovascular: []  chest pain/pressure []  palpitations []  SOB lying flat []  DOE []  pain in legs while walking [x]  pain in legs at rest []  pain in legs at night []  non-healing ulcers []  hx of DVT [x]  swelling in legs  Pulmonary: []  productive cough []  asthma/wheezing []  home O2  Neurologic: []  weakness in []  arms []  legs []  numbness in []  arms []  legs []  hx of CVA []  mini stroke [] difficulty speaking or slurred speech []  temporary loss of vision in one eye []  dizziness  Hematologic: []  hx of cancer []  bleeding problems []  problems with blood clotting easily  Endocrine:   []  diabetes []  thyroid disease  GI []  vomiting blood []  blood in stool  GU: []  CKD/renal failure []  HD--[]  M/W/F or []  T/T/S []  burning with urination []  blood in urine  Psychiatric: []  anxiety []  depression  Musculoskeletal: []  arthritis []  joint pain  Integumentary: []  rashes []  ulcers  Constitutional: []  fever []  chills   Physical Examination  Vitals:   01/25/21 0856  BP: (!) 166/88  Pulse: 68  Resp: 15  Temp: 98 F (36.7 C)  SpO2: 100%   There is no height or weight on file to calculate BMI.  General:  nad HENT: WNL, normocephalic Pulmonary: normal non-labored breathing Cardiac: Palpable popliteal pulse in the left Abdomen:   soft, NT/ND, no masses Extremities: Left below-knee amputation wound 2-1/2 cm across there is no tracking deep I packed with iodoform gauze quarter-inch to approximately half centimeter and placed an Ace wrap above that.  Neurologic: A&O X 3   CBC    Component Value Date/Time   WBC 7.4 10/06/2020 0342   RBC 2.83 (L) 10/06/2020 0342   HGB 8.1 (L) 10/06/2020 0342   HCT 26.1 (L) 10/06/2020 0342   PLT 254 10/06/2020 0342   MCV 92.2 10/06/2020 0342   MCH 28.6 10/06/2020 0342   MCHC 31.0 10/06/2020 0342   RDW 16.2 (H) 10/06/2020 0342   LYMPHSABS 2.3 08/03/2020 1648   MONOABS 0.9 08/03/2020 1648   EOSABS 0.0 08/03/2020 1648   BASOSABS 0.1 08/03/2020 1648    BMET    Component Value Date/Time   NA 137 10/06/2020 0342   K 3.8 10/06/2020 0342   CL 102 10/06/2020 0342   CO2 26 10/06/2020 0342   GLUCOSE 127 (H) 10/06/2020 0342   BUN 10 10/06/2020 0342   BUN 14 02/13/2014 1044   CREATININE 0.65 10/06/2020 0342   CREATININE 0.75 09/23/2020 1058   CALCIUM 8.8 (L) 10/06/2020 0342   GFRNONAA >60 10/06/2020 0342   GFRNONAA >60 02/13/2014 1044   GFRAA >60 05/20/2020 0933   GFRAA >60 02/13/2014 1044    COAGS: Lab Results  Component Value Date   INR 1.1 10/03/2020   INR 1.09 11/06/2018     Non-Invasive Vascular Imaging:   No studies  ASSESSMENT/PLAN: This is a [age] y.o. male fell on his left below-knee amputation residual limb this morning.  He did have some venous oozing this was packed and controlled.  I wrapped with an Ace wrap.  I recommended elevating the residual limb today and taken the packing out tomorrow.  He may be able to return to using his stump shrinker early this week.  I told him if there are still issues tomorrow he can call our office we can see him earlier this week in the office for further evaluation.  He will otherwise keep his scheduled follow-up with Dr. Mackey Birchwoodlark  Rebecah Dangerfield C. Randie Heinzain, MD [facility] [place] Office: [phone] Pager:  [phone]

## 2021-01-25 NOTE — Discharge Instructions (Addendum)
Please follow the directions given by vascular surgery.  Return to the ed for any new or worsening symptoms.

## 2021-01-27 ENCOUNTER — Ambulatory Visit: Payer: BC Managed Care – PPO

## 2021-01-27 DIAGNOSIS — D509 Iron deficiency anemia, unspecified: Secondary | ICD-10-CM | POA: Diagnosis not present

## 2021-01-27 DIAGNOSIS — I10 Essential (primary) hypertension: Secondary | ICD-10-CM | POA: Diagnosis not present

## 2021-01-27 DIAGNOSIS — Z23 Encounter for immunization: Secondary | ICD-10-CM | POA: Diagnosis not present

## 2021-01-27 DIAGNOSIS — E1142 Type 2 diabetes mellitus with diabetic polyneuropathy: Secondary | ICD-10-CM | POA: Diagnosis not present

## 2021-01-29 ENCOUNTER — Telehealth: Payer: Self-pay | Admitting: *Deleted

## 2021-01-29 ENCOUNTER — Other Ambulatory Visit: Payer: Self-pay | Admitting: Internal Medicine

## 2021-01-29 ENCOUNTER — Other Ambulatory Visit: Payer: Self-pay | Admitting: Physician Assistant

## 2021-01-29 DIAGNOSIS — Z87891 Personal history of nicotine dependence: Secondary | ICD-10-CM

## 2021-01-29 MED ORDER — OXYCODONE HCL 5 MG PO TABS: 5.0000 mg | ORAL_TABLET | Freq: Four times a day (QID) | ORAL | 0 refills | Status: DC | PRN

## 2021-01-29 NOTE — Telephone Encounter (Signed)
Patient called stating he sent a picture of wound to [place] in My Chart he wanted to verify wound care instructions. Reviewed care instructions with patient he verbalized understanding. Patient also requesting refill on pain medication states he is unable to sleep at night he has upcoming appt with Dr Chestine Spore. Spoke with Aggie Moats PA he refilled pain med for 15 tabs. No additional pain medication will be provided before patient is seen by Dr. Chestine Spore patient aware.

## 2021-02-10 ENCOUNTER — Ambulatory Visit (INDEPENDENT_AMBULATORY_CARE_PROVIDER_SITE_OTHER): Payer: BC Managed Care – PPO | Admitting: Vascular Surgery

## 2021-02-10 ENCOUNTER — Other Ambulatory Visit: Payer: Self-pay

## 2021-02-10 VITALS — BP 163/88 | HR 69 | Temp 98.3°F

## 2021-02-10 DIAGNOSIS — I739 Peripheral vascular disease, unspecified: Secondary | ICD-10-CM | POA: Diagnosis not present

## 2021-02-10 NOTE — Progress Notes (Signed)
Patient name: Anthony Park MRN: [id number] DOB: 1961/01/05 Sex: male  REASON FOR VISIT: Wound check left BKA  HPI: Anthony Park is a 60 y.o. male who presents for interval 1 month follow-up for interval wound check of left BKA that was performed on 10/03/2020. He has a complex history and previously had a left popliteal stent at [facility] for popliteal aneurysm. We saw him with tissue loss and ultimately he had a left peroneal angioplasty on 08/06/2020 with tibial and small vessel disease in the setting of CLI. Ultimately required a TMA on 08/20/2020 and then TMA debridement and ultimately this became nonhealing and necrotic. He was eventually converted to a BKA.  He was seen in the ED several weeks ago by my partner Dr. Randie Heinz after he had a mechanical fall.  Still putting Betadine paint on the wound anteriorly.  Has been to biotech and has a stump shrinker.  Past Medical History:  Diagnosis Date  . Arthritis   . Diabetes mellitus without complication (HCC)    type 2 controlled with diet   . Diabetic toe ulcer (HCC) 11/05/2018  . History of kidney stones    passed 1 several years ago  . Hyperlipidemia   . Hypertension    not on medications  . Peripheral vascular disease (HCC)    stent in left leg due to anerysm     Past Surgical History:  Procedure Laterality Date  .  Left Transmetatarsal Ampuation (Left Foot)  08/20/2020  . ABDOMINAL AORTOGRAM W/LOWER EXTREMITY N/A 08/06/2020   Procedure: ABDOMINAL AORTOGRAM W/LOWER EXTREMITY;  Surgeon: [name], MD;  Location: [facility];  Service: Cardiovascular;  Laterality: N/A;  . AMPUTATION Right 11/07/2018   Procedure: RIGHT 3RD TOE AMPUTATION;  Surgeon: Nadara Mustard, MD;  Location: [facility] OR;  Service: Orthopedics;  Laterality: Right;  . AMPUTATION Left 08/20/2020   Procedure: Left Transmetatarsal Ampuation;  Surgeon: [name], MD;  Location: [facility] OR;  Service: Vascular;  Laterality: Left;  . AMPUTATION Left  10/03/2020   Procedure: LEFT BELOW KNEE AMPUTATION;  Surgeon: [name], MD;  Location: [facility] OR;  Service: Vascular;  Laterality: Left;  . AMPUTATION TOE Left    big toe and one next to it  . CATARACT EXTRACTION Bilateral   . EYE SURGERY Bilateral    cataracts removed  . PERIPHERAL VASCULAR BALLOON ANGIOPLASTY Left 08/06/2020   Procedure: PERIPHERAL VASCULAR BALLOON ANGIOPLASTY;  Surgeon: [name], MD;  Location: [facility];  Service: Cardiovascular;  Laterality: Left;  Peroneal artery.  . TOE AMPUTATION Left 2015  . TOTAL HIP ARTHROPLASTY Left 05/23/2020   Procedure: LEFT TOTAL HIP ARTHROPLASTY ANTERIOR APPROACH;  Surgeon: Kathryne Hitch, MD;  Location: [facility]S;  Service: Orthopedics;  Laterality: Left;  . WOUND DEBRIDEMENT Left 09/11/2020   Procedure: DEBRIDEMENT OF LEFT TRANSMETATARSAL WOUND;  Surgeon: Leonie Douglas, MD;  Location: [facility] OR;  Service: Vascular;  Laterality: Left;    Family History  Problem Relation Age of Onset  . COPD Mother   . Cataracts Mother   . Brain cancer Father   . Hypertension Brother     SOCIAL HISTORY: Social History   Tobacco Use  . Smoking status: Former Smoker    Packs/day: 0.50    Years: 41.00    Pack years: 20.50    Types: Cigarettes    Quit date: 07/20/2020    Years since quitting: 0.5  . Smokeless tobacco: Never Used  Substance Use Topics  .  Alcohol use: Yes    Alcohol/week: 14.0 standard drinks    Types: 14 Standard drinks or equivalent per week    Allergies  Allergen Reactions  . Meloxicam Nausea Only    Upset stomach     Current Outpatient Medications  Medication Sig Dispense Refill  . acetaminophen (TYLENOL) 500 MG tablet Take 1,000 mg by mouth every 6 (six) hours as needed for moderate pain.    Marland Kitchen ascorbic acid (VITAMIN C) 500 MG tablet Take 500 mg by mouth daily.    Marland Kitchen aspirin EC 81 MG tablet Take 1 tablet (81 mg total) by mouth 2 (two) times daily after a meal. Swallow whole. 30 tablet 0   . atorvastatin (LIPITOR) 10 MG tablet Take 1 tablet (10 mg total) by mouth daily. 30 tablet 11  . cephALEXin (KEFLEX) 500 MG capsule Take 1 capsule (500 mg total) by mouth 3 (three) times daily. 21 capsule 0  . cholecalciferol (VITAMIN D3) 25 MCG (1000 UNIT) tablet Take 1,000 Units by mouth daily.    . clopidogrel (PLAVIX) 75 MG tablet Take 1 tablet (75 mg total) by mouth daily. 30 tablet 11  . cyclobenzaprine (FLEXERIL) 5 MG tablet Take 1 tablet (5 mg total) by mouth 3 (three) times daily as needed for muscle spasms. 30 tablet 0  . folic acid (FOLVITE) 1 MG tablet Take 1 tablet (1 mg total) by mouth daily. 30 tablet 1  . gabapentin (NEURONTIN) 300 MG capsule Take 2 capsules (600 mg total) by mouth 2 (two) times daily. 120 capsule 2  . lisinopril (ZESTRIL) 40 MG tablet Take 1 tablet (40 mg total) by mouth daily. 30 tablet 0  . metFORMIN (GLUCOPHAGE) 500 MG tablet TAKE 1 TABLET BY MOUTH 2 TIMES DAILY WITH A MEAL. 60 tablet 0  . methocarbamol (ROBAXIN) 500 MG tablet Take 1 tablet (500 mg total) by mouth every 8 (eight) hours as needed for muscle spasms. 30 tablet 0  . oxyCODONE (ROXICODONE) 5 MG immediate release tablet Take 1 tablet (5 mg total) by mouth every 6 (six) hours as needed for severe pain. 20 tablet 0  . polyethylene glycol (MIRALAX / GLYCOLAX) 17 g packet Take 17 g by mouth daily as needed for mild constipation. 14 each 0  . senna (SENOKOT) 8.6 MG TABS tablet Take 2 tablets (17.2 mg total) by mouth at bedtime. 120 tablet 0   No current facility-administered medications for this visit.    REVIEW OF SYSTEMS:  [X]  denotes positive finding, [ ]  denotes negative finding Cardiac  Comments:  Chest pain or chest pressure:    Shortness of breath upon exertion:    Short of breath when lying flat:    Irregular heart rhythm:        Vascular    Pain in calf, thigh, or hip brought on by ambulation:    Pain in feet at night that wakes you up from your sleep:     Blood clot in your veins:     Leg swelling:         Pulmonary    Oxygen at home:    Productive cough:     Wheezing:         Neurologic    Sudden weakness in arms or legs:     Sudden numbness in arms or legs:     Sudden onset of difficulty speaking or slurred speech:    Temporary loss of vision in one eye:     Problems with dizziness:  Gastrointestinal    Blood in stool:     Vomited blood:         Genitourinary    Burning when urinating:     Blood in urine:        Psychiatric    Major depression:         Hematologic    Bleeding problems:    Problems with blood clotting too easily:        Skin    Rashes or ulcers:        Constitutional    Fever or chills:      PHYSICAL EXAM: Vitals:   02/10/21 0829  BP: (!) 163/88  Pulse: 69  Temp: 98.3 F (36.8 C)  TempSrc: Skin  SpO2: 97%    GENERAL: The patient is a well-nourished male, in no acute distress. The vital signs are documented above. VASCULAR:  Left BKA pictured below, continues to improve           DATA:   None  Assessment/Plan:  60 year old male with critical limb ischemia of the left lower extremity now status post BKA on 10/03/20.  He had a mechanical fall several weeks ago and was seen in the ED by my partner Dr. Randie Heinz.  Even with this fall and small hematoma this BKA wound has made continued improvement.  Discussed I would continue putting Betadine paint on the small wound anteriorly with a clean dry dressing.  I want to put him back in a stump shrinker to control the edema.  I think this should heal up with time.  He will call me if he has any problems.  He already has a prescription for biotech.  [name], MD [facility] [place] Office: [phone]

## 2021-02-18 ENCOUNTER — Other Ambulatory Visit: Payer: Self-pay | Admitting: Physician Assistant

## 2021-02-18 ENCOUNTER — Telehealth: Payer: Self-pay

## 2021-02-18 NOTE — Telephone Encounter (Signed)
Patient calls today to report pain in his stump when he wears the shrinker. Says the gabapentin is working for the phantom pain, but he is struggling to manage with only Tylenol. Says the site looks okay, and it has formed a scab. Denies redness, fever, chills, and drainage. Discussed with PA, advised patient to supplement Tylenol with Ibuprofen. Discussed daily dosage limits. Patient verbalizes understanding.

## 2021-02-20 ENCOUNTER — Ambulatory Visit
Admission: RE | Admit: 2021-02-20 | Discharge: 2021-02-20 | Disposition: A | Discharge status: Home / Self Care | Payer: BC Managed Care – PPO | Source: Ambulatory Visit | Attending: Internal Medicine | Admitting: Internal Medicine

## 2021-02-20 DIAGNOSIS — M48061 Spinal stenosis, lumbar region without neurogenic claudication: Secondary | ICD-10-CM | POA: Diagnosis not present

## 2021-02-20 DIAGNOSIS — Z87891 Personal history of nicotine dependence: Secondary | ICD-10-CM | POA: Diagnosis not present

## 2021-02-20 DIAGNOSIS — M5137 Other intervertebral disc degeneration, lumbosacral region: Secondary | ICD-10-CM | POA: Diagnosis not present

## 2021-02-27 DIAGNOSIS — D509 Iron deficiency anemia, unspecified: Secondary | ICD-10-CM | POA: Diagnosis not present

## 2021-02-27 DIAGNOSIS — K921 Melena: Secondary | ICD-10-CM | POA: Diagnosis not present

## 2021-02-27 DIAGNOSIS — Z01818 Encounter for other preprocedural examination: Secondary | ICD-10-CM | POA: Diagnosis not present

## 2021-03-05 ENCOUNTER — Other Ambulatory Visit: Payer: Self-pay | Admitting: Endocrinology

## 2021-03-05 ENCOUNTER — Other Ambulatory Visit (HOSPITAL_BASED_OUTPATIENT_CLINIC_OR_DEPARTMENT_OTHER): Payer: Self-pay | Admitting: Endocrinology

## 2021-03-05 ENCOUNTER — Other Ambulatory Visit (HOSPITAL_COMMUNITY): Payer: Self-pay | Admitting: Endocrinology

## 2021-03-05 DIAGNOSIS — R9389 Abnormal findings on diagnostic imaging of other specified body structures: Secondary | ICD-10-CM

## 2021-03-09 DIAGNOSIS — D509 Iron deficiency anemia, unspecified: Secondary | ICD-10-CM | POA: Diagnosis not present

## 2021-03-09 DIAGNOSIS — Z1211 Encounter for screening for malignant neoplasm of colon: Secondary | ICD-10-CM | POA: Diagnosis not present

## 2021-03-09 DIAGNOSIS — K449 Diaphragmatic hernia without obstruction or gangrene: Secondary | ICD-10-CM | POA: Diagnosis not present

## 2021-03-09 DIAGNOSIS — K635 Polyp of colon: Secondary | ICD-10-CM | POA: Diagnosis not present

## 2021-03-09 DIAGNOSIS — K3189 Other diseases of stomach and duodenum: Secondary | ICD-10-CM | POA: Diagnosis not present

## 2021-03-09 DIAGNOSIS — K5730 Diverticulosis of large intestine without perforation or abscess without bleeding: Secondary | ICD-10-CM | POA: Diagnosis not present

## 2021-03-13 ENCOUNTER — Other Ambulatory Visit: Payer: Self-pay

## 2021-03-13 ENCOUNTER — Ambulatory Visit (HOSPITAL_COMMUNITY)
Admission: RE | Admit: 2021-03-13 | Discharge: 2021-03-13 | Disposition: A | Discharge status: Home / Self Care | Payer: BC Managed Care – PPO | Source: Ambulatory Visit | Attending: Endocrinology | Admitting: Endocrinology

## 2021-03-13 DIAGNOSIS — R9389 Abnormal findings on diagnostic imaging of other specified body structures: Secondary | ICD-10-CM | POA: Insufficient documentation

## 2021-03-13 DIAGNOSIS — N1330 Unspecified hydronephrosis: Secondary | ICD-10-CM | POA: Diagnosis not present

## 2021-03-20 DIAGNOSIS — I87311 Chronic venous hypertension (idiopathic) with ulcer of right lower extremity: Secondary | ICD-10-CM | POA: Diagnosis not present

## 2021-04-03 ENCOUNTER — Other Ambulatory Visit: Payer: Self-pay

## 2021-04-03 DIAGNOSIS — Z89512 Acquired absence of left leg below knee: Secondary | ICD-10-CM

## 2021-04-08 ENCOUNTER — Telehealth: Payer: Self-pay

## 2021-04-08 DIAGNOSIS — D509 Iron deficiency anemia, unspecified: Secondary | ICD-10-CM | POA: Diagnosis not present

## 2021-04-08 NOTE — Telephone Encounter (Signed)
Faxed RX for PT 2x a week for up to 3 weeks for patient to [facility]. Patient aware.

## 2021-04-16 DIAGNOSIS — Z89512 Acquired absence of left leg below knee: Secondary | ICD-10-CM | POA: Diagnosis not present

## 2021-04-18 NOTE — Addendum Note (Signed)
Encounter addended by: Novella Olive on: 04/18/2021 12:38 PM  Actions taken: Letter saved

## 2021-04-21 ENCOUNTER — Encounter: Payer: Self-pay | Admitting: Physical Therapy

## 2021-04-21 ENCOUNTER — Ambulatory Visit (INDEPENDENT_AMBULATORY_CARE_PROVIDER_SITE_OTHER): Payer: BC Managed Care – PPO | Admitting: Physical Therapy

## 2021-04-21 ENCOUNTER — Other Ambulatory Visit: Payer: Self-pay

## 2021-04-21 DIAGNOSIS — M6281 Muscle weakness (generalized): Secondary | ICD-10-CM

## 2021-04-21 DIAGNOSIS — R2689 Other abnormalities of gait and mobility: Secondary | ICD-10-CM | POA: Diagnosis not present

## 2021-04-21 DIAGNOSIS — M79662 Pain in left lower leg: Secondary | ICD-10-CM

## 2021-04-21 DIAGNOSIS — M25662 Stiffness of left knee, not elsewhere classified: Secondary | ICD-10-CM

## 2021-04-21 DIAGNOSIS — R2681 Unsteadiness on feet: Secondary | ICD-10-CM

## 2021-04-21 DIAGNOSIS — R293 Abnormal posture: Secondary | ICD-10-CM

## 2021-04-21 DIAGNOSIS — Z9181 History of falling: Secondary | ICD-10-CM

## 2021-04-21 NOTE — Therapy (Signed)
[facility] [address] [place], [place], [address] Phone: [phone]   Fax:  [phone]  Physical Therapy Evaluation  Patient Details  Name: Anthony Park MRN: [id number] Date of Birth: [birth year] Referring Provider (PT): Harold Barban, MD   Encounter Date: 04/21/2021   PT End of Session - 04/21/21 1724     Visit Number 1    Number of Visits 25    Date for PT Re-Evaluation 07/17/21    Authorization Type [employer] comm ppo    Authorization Time Period DED MET AND OOP MET, 120 Visits PT/OT/ST COMBINED,Remaining 116 Visits    Authorization - Visit Number 1    Authorization - Number of Visits 116    PT Start Time 1300    PT Stop Time 1345    PT Time Calculation (min) 45 min    Equipment Utilized During Treatment Gait belt    Activity Tolerance Patient tolerated treatment well;Patient limited by pain    Behavior During Therapy WFL for tasks assessed/performed             Past Medical History:  Diagnosis Date   Arthritis    Diabetes mellitus without complication ([place])    type 2 controlled with diet    Diabetic toe ulcer ([place]) 11/05/2018   History of kidney stones    passed 1 several years ago   Hyperlipidemia    Hypertension    not on medications   Peripheral vascular disease ([place])    stent in left leg due to anerysm     Past Surgical History:  Procedure Laterality Date    Left Transmetatarsal Ampuation (Left Foot)  08/20/2020   ABDOMINAL AORTOGRAM W/LOWER EXTREMITY N/A 08/06/2020   Procedure: ABDOMINAL AORTOGRAM W/LOWER EXTREMITY;  Surgeon: Marty Heck, MD;  Location: [place];  Service: Cardiovascular;  Laterality: N/A;   AMPUTATION Right 11/07/2018   Procedure: RIGHT 3RD TOE AMPUTATION;  Surgeon: Newt Minion, MD;  Location: [place];  Service: Orthopedics;  Laterality: Right;   AMPUTATION Left 08/20/2020   Procedure: Left Transmetatarsal Ampuation;  Surgeon: Marty Heck, MD;  Location: [facility];   Service: Vascular;  Laterality: Left;   AMPUTATION Left 10/03/2020   Procedure: LEFT BELOW KNEE AMPUTATION;  Surgeon: Marty Heck, MD;  Location: [facility];  Service: Vascular;  Laterality: Left;   AMPUTATION TOE Left    big toe and one next to it   CATARACT EXTRACTION Bilateral    EYE SURGERY Bilateral    cataracts removed   PERIPHERAL VASCULAR BALLOON ANGIOPLASTY Left 08/06/2020   Procedure: PERIPHERAL VASCULAR BALLOON ANGIOPLASTY;  Surgeon: Marty Heck, MD;  Location: [facility];  Service: Cardiovascular;  Laterality: Left;  Peroneal artery.   TOE AMPUTATION Left 2015   TOTAL HIP ARTHROPLASTY Left 05/23/2020   Procedure: LEFT TOTAL HIP ARTHROPLASTY ANTERIOR APPROACH;  Surgeon: Mcarthur Rossetti, MD;  Location: [facility];  Service: Orthopedics;  Laterality: Left;   WOUND DEBRIDEMENT Left 09/11/2020   Procedure: DEBRIDEMENT OF LEFT TRANSMETATARSAL WOUND;  Surgeon: Cherre Robins, MD;  Location: [facility];  Service: Vascular;  Laterality: Left;    There were no vitals filed for this visit.    Subjective Assessment - 04/21/21 1300     Subjective This 60yo male was referred to PT by Harold Barban, MD with left Below Knee Amputation which was performed on 10/03/2020 with nonhealing Transmetatarsal Amputation (08/20/2020).  01/25/2021 ED for wound dehiscence with fall. He received his prosthesis on 04/16/2021.  Pertinent History Left TTTA, left THA (05/23/20), arthritis, DM2, HTN, HLD, PVD    Patient Stated Goals Wants to use prosthesis to return to some work, get back in community, be mobile, work in gym in his garage    Currently in Pain? Yes    Pain Score 0-No pain   worst pain 10/10   Pain Location Leg   residual limb   Pain Orientation Left;Anterior;Distal    Pain Descriptors / Indicators Burning;Aching    Pain Type Acute pain;Phantom pain    Pain Onset In the past 7 days    Pain Frequency Intermittent    Aggravating Factors  wearing prosthesis,    Pain Relieving  Factors tylenol, rubbing, ice                [facility] PT Assessment - 04/21/21 1300       Assessment   Medical Diagnosis Left Transtibial Amputaion    Referring Provider (PT) Harold Barban, MD    Onset Date/Surgical Date 04/16/21   prosthesis delivery   Hand Dominance Right      Precautions   Precautions Fall      Balance Screen   Has the patient fallen in the past 6 months Yes    How many times? 1   hopping backwards with RW   Has the patient had a decrease in activity level because of a fear of falling?  No    Is the patient reluctant to leave their home because of a fear of falling?  No      Home Environment   Living Environment Private residence    Living Arrangements Spouse/significant other    Type of [place] entrance   2nd entrance 5 steps no rails.   Home Layout One level    [place] - 2 wheels;Cane - single point;Crutches;Grab bars - tub/shower;Wheelchair - manual      Prior Function   Level of Independence Independent with community mobility with device;Independent with household mobility with device   cane or RW depending on hip   Vocation On disability    Leisure woodworking, Press photographer, pool      Posture/Postural Control   Posture/Postural Control Postural limitations    Postural Limitations Rounded Shoulders;Forward head;Flexed trunk;Weight shift right      ROM / Strength   AROM / PROM / Strength PROM;Strength      PROM   Overall PROM Comments Pt appears to have tightness in bilateral hip flexors & hamstrings.    Right Knee Extension -8      Strength   Overall Strength Deficits    Right Hip Flexion 4/5    Right Hip Extension 3/5   gross testing seated & functionally standing   Right Hip ABduction 3+/5   gross testing seated & functionally standing   Left Hip Flexion 4+/5    Left Hip Extension 4/5   gross testing seated & functionally standing   Left Hip ABduction 4/5   gross testing seated & functionally standing    Right Knee Flexion 4+/5   gross testing seated & functionally standing   Right Knee Extension 5/5    Left Knee Flexion 4-/5   gross testing seated & functionally standing   Left Knee Extension 4/5    Right Ankle Dorsiflexion 4+/5      Transfers   Transfers Sit to Stand;Stand to Sit    Sit to Stand 5: Supervision;With upper extremity assist;With armrests;From chair/3-in-1;Other (comment)  use back of legs against w/c or touches RW to stabilze   Stand to Sit 5: Supervision;With upper extremity assist;With armrests;To chair/3-in-1;Other (comment)   use back of legs against w/c or touches RW to stabilze     Ambulation/Gait   Ambulation/Gait Yes    Ambulation/Gait Assistance 5: Supervision    Ambulation/Gait Assistance Details excessive UE weight bearing on RW,  distance limited by right hip pain.    Ambulation Distance (Feet) 50 Feet    Assistive device Rolling walker;Prosthesis    Gait Pattern Step-to pattern;Decreased step length - right;Decreased stance time - left;Decreased stride length;Decreased hip/knee flexion - left;Decreased weight shift to right;Left flexed knee in stance;Antalgic;Lateral hip instability;Abducted - left;Trunk flexed    Ambulation Surface Level;Indoor      Standardized Balance Assessment   Standardized Balance Assessment Berg Balance Test      Berg Balance Test   Sit to Stand Needs minimal aid to stand or to stabilize    Standing Unsupported Able to stand 2 minutes with supervision    Sitting with Back Unsupported but Feet Supported on Floor or Stool Able to sit safely and securely 2 minutes    Stand to Sit Uses backs of legs against chair to control descent    Transfers Able to transfer safely, definite need of hands    Standing Unsupported with Eyes Closed Unable to keep eyes closed 3 seconds but stays steady    Standing Unsupported with Feet Together Needs help to attain position and unable to hold for 15 seconds    From Standing, Reach Forward with  Outstretched Arm Reaches forward but needs supervision    From Standing Position, Pick up Object from Floor Unable to pick up and needs supervision    From Standing Position, Turn to Look Behind Over each Shoulder Needs supervision when turning    Turn 360 Degrees Needs assistance while turning    Standing Unsupported, Alternately Place Feet on Step/Stool Needs assistance to keep from falling or unable to try    Standing Unsupported, One Foot in Front Loses balance while stepping or standing    Standing on One Leg Unable to try or needs assist to prevent fall    Total Score 17             Prosthetics Assessment - 04/21/21 [place] with Skin check;Residual limb care;Care of non-amputated limb;Prosthetic cleaning;Ply sock cleaning;Correct ply sock adjustment;Proper wear schedule/adjustment;Proper weight-bearing schedule/adjustment    Donning prosthesis  Supervision    Doffing prosthesis  Supervision    Current prosthetic wear tolerance (days/week)  5 of 5 days since delivery    Current prosthetic wear tolerance (#hours/day)  10-15 minutes 1x/day    Current prosthetic weight-bearing tolerance (hours/day)  Patient tolerates standing with partial weight on prosthesis with residual limb pain 7/10.  He reports pain 10/10 with wear >15 min at home.    Edema pitting    Residual limb condition  49m scab on incision midline,  good hair growth proximal to incision, red shiny skin distal to incision, normal temperature, cylinderical shape    Prosthesis Description silicon liner with shuttle pin lock suspension, dynamic response foot                       Objective measurements completed on examination: See above findings.       [facility]Exercise - 04/21/21 1300  Prosthetics   Prosthetic Care Comments  PT recommended wear 2 hrs 2x/day. Sitting with prosthetic heel in contact with ground.    Education Provided Skin  check;Residual limb care;Prosthetic cleaning;Correct ply sock adjustment;Proper Donning;Proper wear schedule/adjustment;Other (comment)   see prosthetic care comments   Person(s) Educated Patient    Education Method Explanation;Demonstration;Tactile cues;Verbal cues    Education Method Verbalized understanding;Tactile cues required;Verbal cues required;Needs further instruction                      PT Short Term Goals - 04/21/21 1736       PT SHORT TERM GOAL #1   Title Patient donnes prosthesis modified independent & verbalizes proper cleaning.    Time 4    Period Weeks    Status New    Target Date 05/21/21      PT SHORT TERM GOAL #2   Title Patient tolerates prosthesis >10 hrs total /day without increase in skin issues or limb pain <5/10 after standing.    Time 4    Period Weeks    Status New    Target Date 05/21/21      PT SHORT TERM GOAL #3   Title Patient able to pick up items from floor & reach 10" with RW support safely.    Time 4    Period Weeks    Status New    Target Date 05/21/21      PT SHORT TERM GOAL #4   Title Patient ambulates 100' with RW & prosthesis with supervision.    Time 4    Period Weeks    Status New    Target Date 05/21/21      PT SHORT TERM GOAL #5   Title Patient negotiates ramps & curbs with RW & prosthesis with minA.    Time 4    Period Weeks    Status New    Target Date 05/21/21               PT Long Term Goals - 04/21/21 1734       PT LONG TERM GOAL #1   Title Patient demonstrates & verbalized understanding of prosthetic care to enable safe utilization of prosthesis.    Time 12    Period Weeks    Status New    Target Date 07/17/21      PT LONG TERM GOAL #2   Title Patient tolerates prosthesis wear for >90% of awake hours without skin issues & limb pain </= 2/10.    Time 12    Period Weeks    Status New    Target Date 07/17/21      PT LONG TERM GOAL #3   Title Berg Balance >/= 36/56 to indicate lower fall  risk    Time 12    Period Weeks    Status New    Target Date 07/17/21      PT LONG TERM GOAL #4   Title Patient ambulates 300' with LRAD & prosthesis modified independent    Time 12    Period Weeks    Status New    Target Date 07/17/21      PT LONG TERM GOAL #5   Title Patient negotiates ramps, curbs & stairs with LRAD & prosthesis modified independent.    Time 12    Period Weeks    Status New    Target Date 07/17/21  Plan - 04/21/21 1725     Clinical Impression Statement This 60yo male underwent a left Transtibial Amputation on 10/03/2020 and received his first prosthesis on 04/16/2021. He has history of left Total Hip Arthroplasty and severe right hip arthritis with need for hip replacement but needs to be mobile with prosthesis first. He has decreased knee extension on left. He has been limited in mobility for several months with resulting significant deconditioning.  He is dependent in proper prosthetic care which increases risk of increasing skin issues and has a wound on his residual limb.  Patient has worn prosthesis daily but only 10-15 minutes with limb pain up to 10/10.   Berg Balance score of 17/56 indicates high fall risk.  Patient's prosthetic gait requires assistance, has significant deviations including excessive UE weight bearing and limited by right hip pain indicating high fall risk. Patient would benefit from skilled PT to improve function & safety with his prosthesis.    Personal Factors and Comorbidities Comorbidity 3+;Fitness;Time since onset of injury/illness/exacerbation;Past/Current Experience    Comorbidities Left TTTA, left THA (05/23/20), arthritis, DM2, HTN, HLD, PVD    Examination-Activity Limitations Locomotion Level;Squat;Stairs;Stand;Transfers    Examination-Participation Restrictions Community Activity;Occupation    Stability/Clinical Decision Making Evolving/Moderate complexity    Clinical Decision Making Moderate    Rehab  Potential Good    PT Frequency 2x / week    PT Duration 12 weeks    PT Treatment/Interventions ADLs/Self Care Home Management;DME Instruction;Gait training;Stair training;Functional mobility training;Therapeutic activities;Therapeutic exercise;Balance training;Neuromuscular re-education;Patient/family education;Prosthetic Training;Vestibular    PT Next Visit Plan review prosthetic care, instruct in HEP, prosthetic gait with RW including ramps & curbs    Consulted and Agree with Plan of Care Patient             Patient will benefit from skilled therapeutic intervention in order to improve the following deficits and impairments:  Abnormal gait, Decreased activity tolerance, Decreased balance, Decreased endurance, Decreased knowledge of use of DME, Decreased mobility, Decreased range of motion, Decreased skin integrity, Decreased strength, Increased edema, Postural dysfunction, Prosthetic Dependency, Pain  Visit Diagnosis: Other abnormalities of gait and mobility  Unsteadiness on feet  Pain in left lower leg  Stiffness of left knee, not elsewhere classified  Muscle weakness (generalized)  Abnormal posture  History of falling     Problem List Patient Active Problem List   Diagnosis Date Noted   S/P BKA (below knee amputation) unilateral, left ([place]) 10/03/2020   Non-healing wound of lower extremity 10/03/2020   History of amputation of left foot through metatarsal bone ([place]) 09/11/2020   PAD (peripheral artery disease) ([place]) 08/20/2020   Status post total replacement of left hip 06/05/2020   Unilateral primary osteoarthritis, right hip 04/17/2020   Unilateral primary osteoarthritis, left hip 04/17/2020   Amputated toe, right ([place]) 11/22/2018   Type 2 diabetes mellitus with foot ulcer, without long-term current use of insulin ([place]) 11/22/2018   Essential hypertension 11/22/2018   Mixed hyperlipidemia 11/22/2018   Morbid obesity ([place]) 11/22/2018   Aneurysm of left popliteal  artery ([place]) 11/22/2018   Status post peripheral artery angioplasty with insertion of stent 11/22/2018   Encounter for smoking cessation counseling 11/22/2018   Osteomyelitis of third toe of right foot ([place])    Cellulitis of third toe of right foot     Jamey Reas, PT, DPT 04/21/2021, 5:39 PM  [facility] [address] [place], [place], [address] Phone: [phone]   Fax:  [phone]  Name: KAYLA WEEKES MRN:  [id number] Date of Birth: [birth year]

## 2021-04-27 ENCOUNTER — Other Ambulatory Visit: Payer: Self-pay

## 2021-04-27 ENCOUNTER — Encounter: Payer: Self-pay | Admitting: Physical Therapy

## 2021-04-27 ENCOUNTER — Ambulatory Visit (INDEPENDENT_AMBULATORY_CARE_PROVIDER_SITE_OTHER): Payer: BC Managed Care – PPO | Admitting: Physical Therapy

## 2021-04-27 DIAGNOSIS — M6281 Muscle weakness (generalized): Secondary | ICD-10-CM

## 2021-04-27 DIAGNOSIS — R2689 Other abnormalities of gait and mobility: Secondary | ICD-10-CM

## 2021-04-27 DIAGNOSIS — M25662 Stiffness of left knee, not elsewhere classified: Secondary | ICD-10-CM

## 2021-04-27 DIAGNOSIS — R2681 Unsteadiness on feet: Secondary | ICD-10-CM

## 2021-04-27 DIAGNOSIS — M79662 Pain in left lower leg: Secondary | ICD-10-CM | POA: Diagnosis not present

## 2021-04-27 DIAGNOSIS — R293 Abnormal posture: Secondary | ICD-10-CM

## 2021-04-27 NOTE — Patient Instructions (Addendum)
Access Code: [email or web address] URL: [email or web address] Date: 04/27/2021 Prepared by: Vladimir Faster  Exercises Modified Maisie Fus Stretch - 1-3 x daily - 7 x weekly - 1 sets - 3 reps - 20-30 seconds hold Supine Quadriceps Stretch with Strap on Table - 1-3 x daily - 7 x weekly - 1 sets - 3 reps - 20-30 seconds hold Standing hip abduction alternating legs - 1 x daily - 7 x weekly - 1-3 sets - 10 reps - 5 seconds hold Standing hip extension alternating legs - 1 x daily - 7 x weekly - 1 sets - 10 reps - 5 seconds hold Mini Squat - 1 x daily - 7 x weekly - 1-3 sets - 10 reps - 5 seconds hold Squat - 1 x daily - 7 x weekly - 1-3 sets - 10 reps - 5 seconds hold  Do each exercise 1-2  times per day Do each exercise 5-10 repetitions Hold each exercise for 2 seconds to feel your location  AT SINK FIND YOUR MIDLINE POSITION AND PLACE FEET EQUAL DISTANCE FROM THE MIDLINE.  Try to find this position when standing still for activities.   USE TAPE ON FLOOR TO MARK THE MIDLINE POSITION which is even with middle of sink.  You also should try to feel with your limb pressure in socket.  You are trying to feel with limb what you used to feel with the bottom of your foot.  Side to Side Shift: Moving your hips only (not shoulders): move weight onto your left leg, HOLD/FEEL pressure in socket.  Move back to equal weight on each leg, HOLD/FEEL pressure in socket. Move weight onto your right leg, HOLD/FEEL pressure in socket. Move back to equal weight on each leg, HOLD/FEEL pressure in socket. Repeat.  Start with both hands on sink, progress to hand on prosthetic side only, then no hands.  Front to Back Shift: Moving your hips only (not shoulders): move your weight forward onto your toes, HOLD/FEEL pressure in socket. Move your weight back to equal Flat Foot on both legs, HOLD/FEEL  pressure in socket. Move your weight back onto your heels, HOLD/FEEL  pressure in socket. Move your weight back to equal on both  legs, HOLD/FEEL  pressure in socket. Repeat.  Start with both hands on sink, progress to hand on prosthetic side only, then no hands.  Moving Cones / Cups: With equal weight on each leg: Hold on with one hand the first time, then progress to no hand supports. Move cups from one side of sink to the other. Place cups ~2" out of your reach, progress to 10" beyond reach.  Place one hand in middle of sink and reach with other hand. Do both arms.  Then hover one hand and move cups with other hand.  Overhead/Upward Reaching: alternated reaching up to top cabinets or ceiling if no cabinets present. Keep equal weight on each leg. Start with one hand support on counter while other hand reaches and progress to no hand support with reaching.  ace one hand in middle of sink and reach with other hand. Do both arms.  Then hover one hand and move cups with other hand.  5.   Looking Over Shoulders: With equal weight on each leg: alternate turning to look over your shoulders with one hand support on counter as needed.  Start with head motions only to look in front of shoulder, then even with shoulder and progress to looking behind you. To look to side, move head /eyes, then  shoulder on side looking pulls back, shift more weight to side looking and pull hip back. Place one hand in middle of sink and let go with other hand so your shoulder can pull back. Switch hands to look other way.   Then hover one hand and look over shoulder. If looking right, use left hand at sink. If looking left, use right hand at sink. 6.  Stepping with leg that is not amputated:  Move items under cabinet out of your way. Shift your hips/pelvis so weight on prosthesis. Tighten muscles in hip on prosthetic side.  SLOWLY step other leg so front of foot is in cabinet. Then step back to floor.    Hanger Socks: 1-ply is yellow color at top, 3-ply is green at top, 5-ply is navy blue at top How many ply you need depends on your limb size.  You should have  even pressure on your limb when standing & walking.  Guidance points: 1. How ease it goes on? Should be some resistance. Too few it goes on too easily. Too many it takes a lot of work to get it on. 2. How many clicks you get. Especially clicks in sitting. 3. After standing or walking, check knee cap. Bottom should be just under the front lip.  Too few bottom of knee cap sits on indention. Too many bottom is above front lip. 4. Have your feet beside each other & hips over feet. Place hands on your waist. Pelvis Should be level. Too few prosthetic side will be low. Too many prosthetic side will be high.    Get ply socks correct before you leave the house. Take extra socks with you. Take one 3-ply and two 1-ply with you. This is in addition to what you are wearing.    Sweating increases with an amputation. Your body is trying to regulate your temperature & without an extremity, you sweat greater amount & quicker to cool off. Also prosthetic material like liners do not breath and add hot layers which causes even more sweating. With time your body typically will accommodate to prosthesis and your sweat level will come closer to level with amputation but not pre-amputation level.   You need to pat your limb & liner dry when you notice sweating. If you leave sweat trapped inside your liner, then it can result in a blister.   Signs of sweating in your liner: You are sweating elsewhere on your body or you notice sweat running / dripping.  Take note of how high your liner comes up on your limb when you first put your liner on your limb. If you notice that your liner has slipped down, then you probably have sweat inside your liner. A good time to check for liner slippage is when toileting.  You feel air bubbles inside your liner. When you liner slips, then air is allowed in bottom. As you put weight on prosthesis, the air is burp or pushed out. You feel something crawling or moving inside your liner. When sweat  runs inside the closed system of liner, it often feels like a bug or something crawling inside your liner.  If any of above symptoms are noted, you need to remove your prosthesis & liner to pat your limb & liner dry. This is permanent need as leaving sweat or water trapped can result in a blister or wound.

## 2021-04-27 NOTE — Therapy (Signed)
[facility] Physical Therapy [address] [place], [place], [address] Phone: [phone]   Fax:  [phone]  Physical Therapy Treatment  Patient Details  Name: Anthony Park MRN: [id number] Date of Birth: 07-08-1961 Referring Provider (PT): Harold Barban, MD   Encounter Date: 04/27/2021   PT End of Session - 04/27/21 0804     Visit Number 2    Number of Visits 25    Date for PT Re-Evaluation 07/17/21    Authorization Type BCBS comm ppo    Authorization Time Period DED MET AND OOP MET, 120 Visits PT/OT/ST COMBINED,Remaining 116 Visits    Authorization - Visit Number 1    Authorization - Number of Visits 116    PT Start Time 0800    PT Stop Time 0846    PT Time Calculation (min) 46 min    Equipment Utilized During Treatment Gait belt    Activity Tolerance Patient tolerated treatment well;Patient limited by pain    Behavior During Therapy WFL for tasks assessed/performed             Past Medical History:  Diagnosis Date   Arthritis    Diabetes mellitus without complication ([place])    type 2 controlled with diet    Diabetic toe ulcer ([place]) 11/05/2018   History of kidney stones    passed 1 several years ago   Hyperlipidemia    Hypertension    not on medications   Peripheral vascular disease ([place])    stent in left leg due to anerysm     Past Surgical History:  Procedure Laterality Date    Left Transmetatarsal Ampuation (Left Foot)  08/20/2020   ABDOMINAL AORTOGRAM W/LOWER EXTREMITY N/A 08/06/2020   Procedure: ABDOMINAL AORTOGRAM W/LOWER EXTREMITY;  Surgeon: Marty Heck, MD;  Location: [place] [facility];  Service: Cardiovascular;  Laterality: N/A;   AMPUTATION Right 11/07/2018   Procedure: RIGHT 3RD TOE AMPUTATION;  Surgeon: Newt Minion, MD;  Location: [place];  Service: Orthopedics;  Laterality: Right;   AMPUTATION Left 08/20/2020   Procedure: Left Transmetatarsal Ampuation;  Surgeon: Marty Heck, MD;  Location: [place];   Service: Vascular;  Laterality: Left;   AMPUTATION Left 10/03/2020   Procedure: LEFT BELOW KNEE AMPUTATION;  Surgeon: Marty Heck, MD;  Location: [place];  Service: Vascular;  Laterality: Left;   AMPUTATION TOE Left    big toe and one next to it   CATARACT EXTRACTION Bilateral    EYE SURGERY Bilateral    cataracts removed   PERIPHERAL VASCULAR BALLOON ANGIOPLASTY Left 08/06/2020   Procedure: PERIPHERAL VASCULAR BALLOON ANGIOPLASTY;  Surgeon: Marty Heck, MD;  Location: [place] [facility];  Service: Cardiovascular;  Laterality: Left;  Peroneal artery.   TOE AMPUTATION Left 2015   TOTAL HIP ARTHROPLASTY Left 05/23/2020   Procedure: LEFT TOTAL HIP ARTHROPLASTY ANTERIOR APPROACH;  Surgeon: Mcarthur Rossetti, MD;  [facility];  Service: Orthopedics;  Laterality: Left;   WOUND DEBRIDEMENT Left 09/11/2020   Procedure: DEBRIDEMENT OF LEFT TRANSMETATARSAL WOUND;  Surgeon: Cherre Robins, MD;  Location: [place];  Service: Vascular;  Laterality: Left;    There were no vitals filed for this visit.   Subjective Assessment - 04/27/21 0800     Subjective He was able to wear 2hrs 2x/day for 3 days and then 4hrs total over 3 or 4 wears.  He has been playing with ply socks.  He has a therapy group from work that is giving him exercises for his  hips.  (PT requested sharing exercises with PT)    Pertinent History Left TTTA, left THA (05/23/20), arthritis, DM2, HTN, HLD, PVD    Patient Stated Goals Wants to use prosthesis to return to some work, get back in community, be mobile, work in gym in his garage    Currently in Pain? No/denies    Pain Onset In the past 7 days                               [facility] Adult PT Treatment/Exercise - 04/27/21 0800       Ambulation/Gait   Ambulation/Gait Yes    Ambulation/Gait Assistance 5: Supervision    Assistive device Rolling walker;Prosthesis      Self-Care   Self-Care ADL's    ADL's using bar stool as modified stand  for ADLs. PT verbalized understanding.      Neuro Re-ed    Neuro Re-ed Details  see pt instructions for HEP including proprioception and balance.      Exercises   Other Exercises  Access Code: [email or web address]  reviewed hip exercises from work group & changed stretches to supine      Prosthetics   Prosthetic Care Comments  PT instructed verbally & demo in scar mobilizations, proper donning with liner umbrella location on limb and signs of sweating. PT instructed adjusting ply socks with too few, too many & correct ply fit.    Current prosthetic wear tolerance (days/week)  daily    Current prosthetic wear tolerance (#hours/day)  4 hrs total (2hrs 2x/day or 1hr 4x/day)    Current prosthetic weight-bearing tolerance (hours/day)  Patient tolerates standing with partial weight on prosthesis with residual limb pain 5/10.  He reports pain 8/10 with wear >15 min at home.    Edema pitting    Residual limb condition  22m scab on incision midline,  good hair growth proximal to incision, red shiny skin distal to incision, normal temperature, cylinderical shape    Education Provided Skin check;Residual limb care;Correct ply sock adjustment;Proper Donning;Other (comment)   see prosthetic care comments   Person(s) Educated Patient    Education Method Explanation;Demonstration;Tactile cues;Verbal cues;Handout    Education Method Verbalized understanding;Returned demonstration;Tactile cues required;Verbal cues required;Needs further instruction    Donning Prosthesis Supervision                      PT Short Term Goals - 04/21/21 1736       PT SHORT TERM GOAL #1   Title Patient donnes prosthesis modified independent & verbalizes proper cleaning.    Time 4    Period Weeks    Status New    Target Date 05/21/21      PT SHORT TERM GOAL #2   Title Patient tolerates prosthesis >10 hrs total /day without increase in skin issues or limb pain <5/10 after standing.    Time 4    Period Weeks    Status  New    Target Date 05/21/21      PT SHORT TERM GOAL #3   Title Patient able to pick up items from floor & reach 10" with RW support safely.    Time 4    Period Weeks    Status New    Target Date 05/21/21      PT SHORT TERM GOAL #4   Title Patient ambulates 100' with RW & prosthesis with supervision.    Time 4  Period Weeks    Status New    Target Date 05/21/21      PT SHORT TERM GOAL #5   Title Patient negotiates ramps & curbs with RW & prosthesis with minA.    Time 4    Period Weeks    Status New    Target Date 05/21/21               PT Long Term Goals - 04/21/21 1734       PT LONG TERM GOAL #1   Title Patient demonstrates & verbalized understanding of prosthetic care to enable safe utilization of prosthesis.    Time 12    Period Weeks    Status New    Target Date 07/17/21      PT LONG TERM GOAL #2   Title Patient tolerates prosthesis wear for >90% of awake hours without skin issues & limb pain </= 2/10.    Time 12    Period Weeks    Status New    Target Date 07/17/21      PT LONG TERM GOAL #3   Title Berg Balance >/= 36/56 to indicate lower fall risk    Time 12    Period Weeks    Status New    Target Date 07/17/21      PT LONG TERM GOAL #4   Title Patient ambulates 300' with LRAD & prosthesis modified independent    Time 12    Period Weeks    Status New    Target Date 07/17/21      PT LONG TERM GOAL #5   Title Patient negotiates ramps, curbs & stairs with LRAD & prosthesis modified independent.    Time 12    Period Weeks    Status New    Target Date 07/17/21                   Plan - 04/27/21 0804     Clinical Impression Statement PT instructed pt in scar mobs, donning, adjusting ply socks & sweat management which he appears to understand.  PT switched hip flexor & quad stretch to supine. PT instructed in HEP which he appears to understand.    Personal Factors and Comorbidities Comorbidity 3+;Fitness;Time since onset of  injury/illness/exacerbation;Past/Current Experience    Comorbidities Left TTTA, left THA (05/23/20), arthritis, DM2, HTN, HLD, PVD    Examination-Activity Limitations Locomotion Level;Squat;Stairs;Stand;Transfers    Examination-Participation Restrictions Community Activity;Occupation    Stability/Clinical Decision Making Evolving/Moderate complexity    Rehab Potential Good    PT Frequency 2x / week    PT Duration 12 weeks    PT Treatment/Interventions ADLs/Self Care Home Management;DME Instruction;Gait training;Stair training;Functional mobility training;Therapeutic activities;Therapeutic exercise;Balance training;Neuromuscular re-education;Patient/family education;Prosthetic Training;Vestibular    PT Next Visit Plan review prosthetic care incr wear, check HEP, prosthetic gait with RW including ramps & curbs    PT Home Exercise Plan Access Code: [email or web address]  + sink HEP    Consulted and Agree with Plan of Care Patient             Patient will benefit from skilled therapeutic intervention in order to improve the following deficits and impairments:  Abnormal gait, Decreased activity tolerance, Decreased balance, Decreased endurance, Decreased knowledge of use of DME, Decreased mobility, Decreased range of motion, Decreased skin integrity, Decreased strength, Increased edema, Postural dysfunction, Prosthetic Dependency, Pain  Visit Diagnosis: Unsteadiness on feet  Other abnormalities of gait and mobility  Pain in left lower leg  Stiffness of  left knee, not elsewhere classified  Muscle weakness (generalized)  Abnormal posture     Problem List Patient Active Problem List   Diagnosis Date Noted   S/P BKA (below knee amputation) unilateral, left ([place]) 10/03/2020   Non-healing wound of lower extremity 10/03/2020   History of amputation of left foot through metatarsal bone ([place]) 09/11/2020   PAD (peripheral artery disease) ([place]) 08/20/2020   Status post total replacement of left hip  06/05/2020   Unilateral primary osteoarthritis, right hip 04/17/2020   Unilateral primary osteoarthritis, left hip 04/17/2020   Amputated toe, right ([place]) 11/22/2018   Type 2 diabetes mellitus with foot ulcer, without long-term current use of insulin ([place]) 11/22/2018   Essential hypertension 11/22/2018   Mixed hyperlipidemia 11/22/2018   Morbid obesity ([place]) 11/22/2018   Aneurysm of left popliteal artery ([place]) 11/22/2018   Status post peripheral artery angioplasty with insertion of stent 11/22/2018   Encounter for smoking cessation counseling 11/22/2018   Osteomyelitis of third toe of right foot ([place])    Cellulitis of third toe of right foot     Jamey Reas, PT, DPT 04/27/2021, 9:01 AM  [facility] Physical Therapy [address] [place], [place], [address] Phone: [phone]   Fax:  [phone]  Name: DONTRAIL BLACKWELL MRN: [id number] Date of Birth: Oct 09, 1960

## 2021-04-29 ENCOUNTER — Ambulatory Visit (INDEPENDENT_AMBULATORY_CARE_PROVIDER_SITE_OTHER): Payer: BC Managed Care – PPO | Admitting: Physical Therapy

## 2021-04-29 ENCOUNTER — Other Ambulatory Visit: Payer: Self-pay

## 2021-04-29 ENCOUNTER — Encounter: Payer: Self-pay | Admitting: Physical Therapy

## 2021-04-29 DIAGNOSIS — R2689 Other abnormalities of gait and mobility: Secondary | ICD-10-CM | POA: Diagnosis not present

## 2021-04-29 DIAGNOSIS — R2681 Unsteadiness on feet: Secondary | ICD-10-CM | POA: Diagnosis not present

## 2021-04-29 DIAGNOSIS — R293 Abnormal posture: Secondary | ICD-10-CM

## 2021-04-29 DIAGNOSIS — M79662 Pain in left lower leg: Secondary | ICD-10-CM

## 2021-04-29 DIAGNOSIS — M6281 Muscle weakness (generalized): Secondary | ICD-10-CM

## 2021-04-29 DIAGNOSIS — M25662 Stiffness of left knee, not elsewhere classified: Secondary | ICD-10-CM

## 2021-04-29 NOTE — Therapy (Signed)
[facility] Physical Therapy [address] [place], [place], [address] Phone: [phone]   Fax:  [phone]  Physical Therapy Treatment  Patient Details  Name: Anthony Park MRN: [id number] Date of Birth: 1961-07-03 Referring Provider (PT): Harold Barban, MD   Encounter Date: 04/29/2021   PT End of Session - 04/29/21 0859     Visit Number 3    Number of Visits 25    Date for PT Re-Evaluation 07/17/21    Authorization Type BCBS comm ppo    Authorization Time Period DED MET AND OOP MET, 120 Visits PT/OT/ST COMBINED,Remaining 116 Visits    Authorization - Visit Number 1    Authorization - Number of Visits 116    PT Start Time 0845    PT Stop Time 0931    PT Time Calculation (min) 46 min    Equipment Utilized During Treatment Gait belt    Activity Tolerance Patient tolerated treatment well;Patient limited by pain    Behavior During Therapy WFL for tasks assessed/performed             Past Medical History:  Diagnosis Date   Arthritis    Diabetes mellitus without complication ([place])    type 2 controlled with diet    Diabetic toe ulcer ([place]) 11/05/2018   History of kidney stones    passed 1 several years ago   Hyperlipidemia    Hypertension    not on medications   Peripheral vascular disease ([employer])    stent in left leg due to anerysm     Past Surgical History:  Procedure Laterality Date    Left Transmetatarsal Ampuation (Left Foot)  08/20/2020   ABDOMINAL AORTOGRAM W/LOWER EXTREMITY N/A 08/06/2020   Procedure: ABDOMINAL AORTOGRAM W/LOWER EXTREMITY;  Surgeon: Marty Heck, MD;  Location: [place] [facility];  Service: Cardiovascular;  Laterality: N/A;   AMPUTATION Right 11/07/2018   Procedure: RIGHT 3RD TOE AMPUTATION;  Surgeon: Newt Minion, MD;  Location: [place];  Service: Orthopedics;  Laterality: Right;   AMPUTATION Left 08/20/2020   Procedure: Left Transmetatarsal Ampuation;  Surgeon: Marty Heck, MD;  Location: [place];   Service: Vascular;  Laterality: Left;   AMPUTATION Left 10/03/2020   Procedure: LEFT BELOW KNEE AMPUTATION;  Surgeon: Marty Heck, MD;  Location: [place];  Service: Vascular;  Laterality: Left;   AMPUTATION TOE Left    big toe and one next to it   CATARACT EXTRACTION Bilateral    EYE SURGERY Bilateral    cataracts removed   PERIPHERAL VASCULAR BALLOON ANGIOPLASTY Left 08/06/2020   Procedure: PERIPHERAL VASCULAR BALLOON ANGIOPLASTY;  Surgeon: Marty Heck, MD;  Location: [place] [facility];  Service: Cardiovascular;  Laterality: Left;  Peroneal artery.   TOE AMPUTATION Left 2015   TOTAL HIP ARTHROPLASTY Left 05/23/2020   Procedure: LEFT TOTAL HIP ARTHROPLASTY ANTERIOR APPROACH;  Surgeon: Mcarthur Rossetti, MD;  [facility];  Service: Orthopedics;  Laterality: Left;   WOUND DEBRIDEMENT Left 09/11/2020   Procedure: DEBRIDEMENT OF LEFT TRANSMETATARSAL WOUND;  Surgeon: Cherre Robins, MD;  Location: [place];  Service: Vascular;  Laterality: Left;    There were no vitals filed for this visit.   Subjective Assessment - 04/29/21 0902     Subjective He wore prosthesis 2hrs 2x/day with distal posterior limb sore.    Pertinent History Left TTTA, left THA (05/23/20), arthritis, DM2, HTN, HLD, PVD    Patient Stated Goals Wants to use prosthesis to return to some work, get back  in community, be mobile, work in gym in his garage    Currently in Pain? No/denies    Pain Onset In the past 7 days                               [facility] Adult PT Treatment/Exercise - 04/29/21 0845       Transfers   Transfers Sit to Stand;Stand to Sit    Sit to Stand 5: Supervision;With upper extremity assist;From chair/3-in-1;Other (comment)   to locked rollator walker   Sit to Stand Details Visual cues/gestures for sequencing;Verbal cues for technique    Stand to Sit 5: Supervision;With upper extremity assist;To chair/3-in-1;Other (comment)   from rollator walker   Stand to Sit  Details (indicate cue type and reason) Verbal cues for technique;Visual cues/gestures for sequencing      Ambulation/Gait   Ambulation/Gait Yes    Ambulation/Gait Assistance 5: Supervision    Ambulation/Gait Assistance Details PT demo & verbal cues on safety with rollator walker use.    Ambulation Distance (Feet) 50 Feet   50' X 2   Assistive device Prosthesis;Rollator    Ambulation Surface Level;Indoor    Ramp 4: Min assist   min guard with rollator walker & TTA prosthesis   Ramp Details (indicate cue type and reason) demo, verbal & tactile cues on technique      Self-Care   Self-Care --    ADL's --      Neuro Re-ed    Neuro Re-ed Details  pt reports no issues with HEP instructed last session      Exercises   Other Exercises  --      Prosthetics   Prosthetic Care Comments  PT demo & verbal cues on use of cutoff Vivewear shrinker under liner as long as he has wound.  PT also instructed in use of cutoff socks on distal limb to tighten fit.  PT recommended increasing total wear time to 6hrs.  If wearing >2hrs he needs to check if Vivewear shrinker is damp.    Current prosthetic wear tolerance (days/week)  daily    Current prosthetic wear tolerance (#hours/day)  4 hrs total (2hrs 2x/day or 1hr 4x/day)    Current prosthetic weight-bearing tolerance (hours/day)  Patient tolerates standing with partial weight on prosthesis with residual limb pain 4/10.  He reports pain 7-8/10 with wear >15 min at home.    Edema pitting    Residual limb condition  65mm wound that scab fell off on incision midline,  good hair growth proximal to incision, red shiny skin distal to incision, normal temperature, cylinderical shape    Education Provided Skin check;Residual limb care;Correct ply sock adjustment;Other (comment);Proper wear schedule/adjustment   see prosthetic care comments   Person(s) Educated Patient    Education Method Explanation;Demonstration;Tactile cues;Verbal cues    Education Method  Verbalized understanding;Tactile cues required;Verbal cues required;Needs further instruction    Donning Prosthesis Supervision                    PT Education - 04/29/21 0945     Education Details Increase activity level by not sitting in w/c in home when wearing prosthesis to factilitate walking in home for mobility over rolling in w/c and using walker to enter / exit home.    Person(s) Educated Patient    Methods Explanation;Verbal cues    Comprehension Verbalized understanding  PT Short Term Goals - 04/21/21 1736       PT SHORT TERM GOAL #1   Title Patient donnes prosthesis modified independent & verbalizes proper cleaning.    Time 4    Period Weeks    Status New    Target Date 05/21/21      PT SHORT TERM GOAL #2   Title Patient tolerates prosthesis >10 hrs total /day without increase in skin issues or limb pain <5/10 after standing.    Time 4    Period Weeks    Status New    Target Date 05/21/21      PT SHORT TERM GOAL #3   Title Patient able to pick up items from floor & reach 10" with RW support safely.    Time 4    Period Weeks    Status New    Target Date 05/21/21      PT SHORT TERM GOAL #4   Title Patient ambulates 100' with RW & prosthesis with supervision.    Time 4    Period Weeks    Status New    Target Date 05/21/21      PT SHORT TERM GOAL #5   Title Patient negotiates ramps & curbs with RW & prosthesis with minA.    Time 4    Period Weeks    Status New    Target Date 05/21/21               PT Long Term Goals - 04/21/21 1734       PT LONG TERM GOAL #1   Title Patient demonstrates & verbalized understanding of prosthetic care to enable safe utilization of prosthesis.    Time 12    Period Weeks    Status New    Target Date 07/17/21      PT LONG TERM GOAL #2   Title Patient tolerates prosthesis wear for >90% of awake hours without skin issues & limb pain </= 2/10.    Time 12    Period Weeks    Status New     Target Date 07/17/21      PT LONG TERM GOAL #3   Title Berg Balance >/= 36/56 to indicate lower fall risk    Time 12    Period Weeks    Status New    Target Date 07/17/21      PT LONG TERM GOAL #4   Title Patient ambulates 300' with LRAD & prosthesis modified independent    Time 12    Period Weeks    Status New    Target Date 07/17/21      PT LONG TERM GOAL #5   Title Patient negotiates ramps, curbs & stairs with LRAD & prosthesis modified independent.    Time 12    Period Weeks    Status New    Target Date 07/17/21                   Plan - 04/29/21 0900     Clinical Impression Statement Patient appears to understand use of cutoff Vivewear shrinker under liner until wound heals.  PT instructed in negotiating ramps & curbs which appears to have basic understanding.    Personal Factors and Comorbidities Comorbidity 3+;Fitness;Time since onset of injury/illness/exacerbation;Past/Current Experience    Comorbidities Left TTTA, left THA (05/23/20), arthritis, DM2, HTN, HLD, PVD    Examination-Activity Limitations Locomotion Level;Squat;Stairs;Stand;Transfers    Examination-Participation Restrictions Community Activity;Occupation    Stability/Clinical Decision Making Evolving/Moderate  complexity    Rehab Potential Good    PT Frequency 2x / week    PT Duration 12 weeks    PT Treatment/Interventions ADLs/Self Care Home Management;DME Instruction;Gait training;Stair training;Functional mobility training;Therapeutic activities;Therapeutic exercise;Balance training;Neuromuscular re-education;Patient/family education;Prosthetic Training;Vestibular    PT Next Visit Plan check wound, prosthetic gait with rollator walker including ramps & curbs. Balance activities, therapeutic exercise    PT Home Exercise Plan Access Code: [email or web address]  + sink HEP    Consulted and Agree with Plan of Care Patient             Patient will benefit from skilled therapeutic intervention in order to  improve the following deficits and impairments:  Abnormal gait, Decreased activity tolerance, Decreased balance, Decreased endurance, Decreased knowledge of use of DME, Decreased mobility, Decreased range of motion, Decreased skin integrity, Decreased strength, Increased edema, Postural dysfunction, Prosthetic Dependency, Pain  Visit Diagnosis: Unsteadiness on feet  Other abnormalities of gait and mobility  Pain in left lower leg  Stiffness of left knee, not elsewhere classified  Muscle weakness (generalized)  Abnormal posture     Problem List Patient Active Problem List   Diagnosis Date Noted   S/P BKA (below knee amputation) unilateral, left ([place]) 10/03/2020   Non-healing wound of lower extremity 10/03/2020   History of amputation of left foot through metatarsal bone ([place]) 09/11/2020   PAD (peripheral artery disease) ([place]) 08/20/2020   Status post total replacement of left hip 06/05/2020   Unilateral primary osteoarthritis, right hip 04/17/2020   Unilateral primary osteoarthritis, left hip 04/17/2020   Amputated toe, right ([place]) 11/22/2018   Type 2 diabetes mellitus with foot ulcer, without long-term current use of insulin ([place]) 11/22/2018   Essential hypertension 11/22/2018   Mixed hyperlipidemia 11/22/2018   Morbid obesity ([place]) 11/22/2018   Aneurysm of left popliteal artery ([place]) 11/22/2018   Status post peripheral artery angioplasty with insertion of stent 11/22/2018   Encounter for smoking cessation counseling 11/22/2018   Osteomyelitis of third toe of right foot ([place])    Cellulitis of third toe of right foot     Jamey Reas, PT, DPT 04/29/2021, 9:49 AM  [facility] Physical Therapy [address] [place], [place], [address] Phone: [phone]   Fax:  [phone]  Name: STEAVEN WHOLEY MRN: [id number] Date of Birth: [birth year]

## 2021-05-05 ENCOUNTER — Ambulatory Visit (INDEPENDENT_AMBULATORY_CARE_PROVIDER_SITE_OTHER): Payer: BC Managed Care – PPO | Admitting: Physical Therapy

## 2021-05-05 ENCOUNTER — Other Ambulatory Visit: Payer: Self-pay

## 2021-05-05 ENCOUNTER — Encounter: Payer: Self-pay | Admitting: Physical Therapy

## 2021-05-05 DIAGNOSIS — R2689 Other abnormalities of gait and mobility: Secondary | ICD-10-CM

## 2021-05-05 DIAGNOSIS — M25662 Stiffness of left knee, not elsewhere classified: Secondary | ICD-10-CM

## 2021-05-05 DIAGNOSIS — R293 Abnormal posture: Secondary | ICD-10-CM

## 2021-05-05 DIAGNOSIS — M79662 Pain in left lower leg: Secondary | ICD-10-CM | POA: Diagnosis not present

## 2021-05-05 DIAGNOSIS — R2681 Unsteadiness on feet: Secondary | ICD-10-CM

## 2021-05-05 DIAGNOSIS — M6281 Muscle weakness (generalized): Secondary | ICD-10-CM

## 2021-05-05 NOTE — Therapy (Signed)
[facility] [address] [place], , [address] Phone: [phone]   Fax:  [phone]  Physical Therapy Treatment  Patient Details  Name: Anthony Park MRN: [id number] Date of Birth: 10/14/1960 Referring Provider (PT): Vance Brabham, MD   Encounter Date: 05/05/2021   PT End of Session - 05/05/21 0844     Visit Number 4    Number of Visits 25    Date for PT Re-Evaluation 07/17/21    Authorization Type [employer] comm ppo    Authorization Time Period DED MET AND OOP MET, 120 Visits PT/OT/ST COMBINED,Remaining 116 Visits    Authorization - Visit Number 4    Authorization - Number of Visits 116    PT Start Time 0844    PT Stop Time 0930    PT Time Calculation (min) 46 min    Equipment Utilized During Treatment Gait belt    Activity Tolerance Patient tolerated treatment well;Patient limited by pain    Behavior During Therapy WFL for tasks assessed/performed             Past Medical History:  Diagnosis Date   Arthritis    Diabetes mellitus without complication (HCC)    type 2 controlled with diet    Diabetic toe ulcer (HCC) 11/05/2018   History of kidney stones    passed 1 several years ago   Hyperlipidemia    Hypertension    not on medications   Peripheral vascular disease (HCC)    stent in left leg due to anerysm     Past Surgical History:  Procedure Laterality Date    Left Transmetatarsal Ampuation (Left Foot)  08/20/2020   ABDOMINAL AORTOGRAM W/LOWER EXTREMITY N/A 08/06/2020   Procedure: ABDOMINAL AORTOGRAM W/LOWER EXTREMITY;  Surgeon: Clark, Christopher J, MD;  Location: [facility];  Service: Cardiovascular;  Laterality: N/A;   AMPUTATION Right 11/07/2018   Procedure: RIGHT 3RD TOE AMPUTATION;  Surgeon: Duda, Marcus V, MD;  Location: [facility];  Service: Orthopedics;  Laterality: Right;   AMPUTATION Left 08/20/2020   Procedure: Left Transmetatarsal Ampuation;  Surgeon: Clark, Christopher J, MD;  Location: [facility];   Service: Vascular;  Laterality: Left;   AMPUTATION Left 10/03/2020   Procedure: LEFT BELOW KNEE AMPUTATION;  Surgeon: Clark, Christopher J, MD;  Location: [facility];  Service: Vascular;  Laterality: Left;   AMPUTATION TOE Left    big toe and one next to it   CATARACT EXTRACTION Bilateral    EYE SURGERY Bilateral    cataracts removed   PERIPHERAL VASCULAR BALLOON ANGIOPLASTY Left 08/06/2020   Procedure: PERIPHERAL VASCULAR BALLOON ANGIOPLASTY;  Surgeon: Clark, Christopher J, MD;  Location: [facility];  Service: Cardiovascular;  Laterality: Left;  Peroneal artery.   TOE AMPUTATION Left 2015   TOTAL HIP ARTHROPLASTY Left 05/23/2020   Procedure: LEFT TOTAL HIP ARTHROPLASTY ANTERIOR APPROACH;  Surgeon: Blackman, Christopher Y, MD;  Location: [facility];  Service: Orthopedics;  Laterality: Left;   WOUND DEBRIDEMENT Left 09/11/2020   Procedure: DEBRIDEMENT OF LEFT TRANSMETATARSAL WOUND;  Surgeon: Hawken, Thomas N, MD;  Location: [facility];  Service: Vascular;  Laterality: Left;    There were no vitals filed for this visit.   Subjective Assessment - 05/05/21 0845     Subjective Prosthetist added pads on Thursday which hurt his limb.  He called prosthetist on Monday and agreed to patient remove pads.  He has limited wear due to limb pain.    Pertinent History Left TTTA, left THA (05/23/20), arthritis, DM2,   HTN, HLD, PVD    Patient Stated Goals Wants to use prosthesis to return to some work, get back in community, be mobile, work in gym in his garage    Currently in Pain? Yes    Pain Score 3     Pain Location Leg   residual limb   Pain Orientation Left    Pain Descriptors / Indicators Aching;Sore    Pain Type Other (Comment)   prosthetic   Pain Onset 1 to 4 weeks ago    Pain Frequency Intermittent    Aggravating Factors  pads in prosthesis    Pain Relieving Factors tylenol, rubbing, ice                               [facility] Adult PT Treatment/Exercise - 05/05/21 0845        Transfers   Transfers Sit to Stand;Stand to Sit    Sit to Stand 5: Supervision;With upper extremity assist;From chair/3-in-1;Other (comment)   to locked rollator walker   Sit to Stand Details Visual cues/gestures for sequencing;Verbal cues for technique    Stand to Sit 5: Supervision;With upper extremity assist;To chair/3-in-1;Other (comment)   from rollator walker   Stand to Sit Details (indicate cue type and reason) Verbal cues for technique;Visual cues/gestures for sequencing      Ambulation/Gait   Ambulation/Gait Yes    Ambulation/Gait Assistance 5: Supervision    Ambulation Distance (Feet) 150 Feet   150' X 2 enter & exit builiding   Assistive device Prosthesis;Rollator    Ramp --    Gait Comments discussed investigating different rollator walkers including one that would adjust to his height. pt verbalized understanding.      Neuro Re-ed    Neuro Re-ed Details  --      Exercises   Exercises Knee/Hip      Knee/Hip Exercises: Standing   Forward Step Up Both;1 set;5 reps;Hand Hold: 2;Step Height: 6"    Forward Step Up Limitations demo & verbal cues on technique    Step Down Both;1 set;5 reps;Hand Hold: 2;Step Height: 6"    Step Down Limitations demo & verbal cues on technique    Rocker Board 1 minute   ant/level/post & right/level/left, BUE support goal light as possible   Diplomatic Services operational officer Limitations demo & verbal cues on technique      Knee/Hip Exercises: Seated   Sit to Sand 1 set;10 reps;without UE support   from 24" bar stool, demo & verbal cues on technique including equal weight bearing     Prosthetics   Prosthetic Care Comments  PT recommended increasing total wear time to 6hrs.  If wearing >2hrs he needs to check if Vivewear shrinker is damp.    Current prosthetic wear tolerance (days/week)  daily    Current prosthetic wear tolerance (#hours/day)  4 hrs total (2hrs 2x/day or 1hr 4x/day)    Current prosthetic weight-bearing tolerance (hours/day)  Patient tolerates standing  with partial weight on prosthesis with residual limb pain 4/10.  He reports pain 7-8/10 with wear >15 min at home.    Edema pitting    Residual limb condition  64m wound that scab fell off on incision midline,  good hair growth proximal to incision, red shiny skin distal to incision, normal temperature, cylinderical shape    Education Provided Skin check;Residual limb care;Correct ply sock adjustment;Other (comment);Proper wear schedule/adjustment   see prosthetic care comments   Person(s) Educated Patient  Education Method Explanation;Verbal cues    Education Method Verbalized understanding;Verbal cues required    Donning Prosthesis Modified independent (device/increased time)                 Balance Exercises - 05/05/21 0845       Balance Exercises: Standing   Standing Eyes Opened Wide ([place]);Foam/compliant surface;Head turns;5 reps   4 directions   Standing Eyes Opened Limitations visual, tactile & verbal cues on balance reactions    Standing Eyes Closed Wide (BOA);Solid surface;Head turns;5 reps   4 directions   Standing Eyes Closed Limitations tactile & verbal cues on balance reactions                 PT Short Term Goals - 04/21/21 1736       PT SHORT TERM GOAL #1   Title Patient donnes prosthesis modified independent & verbalizes proper cleaning.    Time 4    Period Weeks    Status New    Target Date 05/21/21      PT SHORT TERM GOAL #2   Title Patient tolerates prosthesis >10 hrs total /day without increase in skin issues or limb pain <5/10 after standing.    Time 4    Period Weeks    Status New    Target Date 05/21/21      PT SHORT TERM GOAL #3   Title Patient able to pick up items from floor & reach 10" with RW support safely.    Time 4    Period Weeks    Status New    Target Date 05/21/21      PT SHORT TERM GOAL #4   Title Patient ambulates 100' with RW & prosthesis with supervision.    Time 4    Period Weeks    Status New    Target Date  05/21/21      PT SHORT TERM GOAL #5   Title Patient negotiates ramps & curbs with RW & prosthesis with minA.    Time 4    Period Weeks    Status New    Target Date 05/21/21               PT Long Term Goals - 04/21/21 1734       PT LONG TERM GOAL #1   Title Patient demonstrates & verbalized understanding of prosthetic care to enable safe utilization of prosthesis.    Time 12    Period Weeks    Status New    Target Date 07/17/21      PT LONG TERM GOAL #2   Title Patient tolerates prosthesis wear for >90% of awake hours without skin issues & limb pain </= 2/10.    Time 12    Period Weeks    Status New    Target Date 07/17/21      PT LONG TERM GOAL #3   Title Berg Balance >/= 36/56 to indicate lower fall risk    Time 12    Period Weeks    Status New    Target Date 07/17/21      PT LONG TERM GOAL #4   Title Patient ambulates 300' with LRAD & prosthesis modified independent    Time 12    Period Weeks    Status New    Target Date 07/17/21      PT LONG TERM GOAL #5   Title Patient negotiates ramps, curbs & stairs with LRAD & prosthesis modified independent.  Time 12    Period Weeks    Status New    Target Date 07/17/21                   Plan - 05/05/21 0844     Clinical Impression Statement PT worked on standing balance activities to facilitate ankle/residual limb & hip strategies for balance.  His limb is tolerating increased wear of prosthesis.  Pt is on target to meet STGs by target date.    Personal Factors and Comorbidities Comorbidity 3+;Fitness;Time since onset of injury/illness/exacerbation;Past/Current Experience    Comorbidities Left TTTA, left THA (05/23/20), arthritis, DM2, HTN, HLD, PVD    Examination-Activity Limitations Locomotion Level;Squat;Stairs;Stand;Transfers    Examination-Participation Restrictions Community Activity;Occupation    Stability/Clinical Decision Making Evolving/Moderate complexity    Rehab Potential Good    PT  Frequency 2x / week    PT Duration 12 weeks    PT Treatment/Interventions ADLs/Self Care Home Management;DME Instruction;Gait training;Stair training;Functional mobility training;Therapeutic activities;Therapeutic exercise;Balance training;Neuromuscular re-education;Patient/family education;Prosthetic Training;Vestibular    PT Next Visit Plan check wound, instruct how to pick up item from floor, prosthetic gait with rollator walker including ramps & curbs. Balance activities, therapeutic exercise    PT Home Exercise Plan Access Code: [email or web address]  + sink HEP    Consulted and Agree with Plan of Care Patient             Patient will benefit from skilled therapeutic intervention in order to improve the following deficits and impairments:  Abnormal gait, Decreased activity tolerance, Decreased balance, Decreased endurance, Decreased knowledge of use of DME, Decreased mobility, Decreased range of motion, Decreased skin integrity, Decreased strength, Increased edema, Postural dysfunction, Prosthetic Dependency, Pain  Visit Diagnosis: Unsteadiness on feet  Other abnormalities of gait and mobility  Pain in left lower leg  Stiffness of left knee, not elsewhere classified  Muscle weakness (generalized)  Abnormal posture     Problem List Patient Active Problem List   Diagnosis Date Noted   S/P BKA (below knee amputation) unilateral, left ([place]) 10/03/2020   Non-healing wound of lower extremity 10/03/2020   History of amputation of left foot through metatarsal bone ([place]) 09/11/2020   PAD (peripheral artery disease) ([place]) 08/20/2020   Status post total replacement of left hip 06/05/2020   Unilateral primary osteoarthritis, right hip 04/17/2020   Unilateral primary osteoarthritis, left hip 04/17/2020   Amputated toe, right ([place]) 11/22/2018   Type 2 diabetes mellitus with foot ulcer, without long-term current use of insulin ([place]) 11/22/2018   Essential hypertension 11/22/2018   Mixed  hyperlipidemia 11/22/2018   Morbid obesity ([place]) 11/22/2018   Aneurysm of left popliteal artery ([place]) 11/22/2018   Status post peripheral artery angioplasty with insertion of stent 11/22/2018   Encounter for smoking cessation counseling 11/22/2018   Osteomyelitis of third toe of right foot ([place])    Cellulitis of third toe of right foot     Jamey Reas, PT, DPT 05/05/2021, 5:55 PM  [facility] [address] [place], [place], [address] Phone: [phone]   Fax:  [phone]  Name: Anthony Park MRN: [id number] Date of Birth: 19-Oct-1960

## 2021-05-07 ENCOUNTER — Ambulatory Visit (INDEPENDENT_AMBULATORY_CARE_PROVIDER_SITE_OTHER): Payer: BC Managed Care – PPO | Admitting: Physical Therapy

## 2021-05-07 ENCOUNTER — Other Ambulatory Visit: Payer: Self-pay

## 2021-05-07 ENCOUNTER — Encounter: Payer: Self-pay | Admitting: Physical Therapy

## 2021-05-07 DIAGNOSIS — R2681 Unsteadiness on feet: Secondary | ICD-10-CM | POA: Diagnosis not present

## 2021-05-07 DIAGNOSIS — R2689 Other abnormalities of gait and mobility: Secondary | ICD-10-CM

## 2021-05-07 DIAGNOSIS — M25662 Stiffness of left knee, not elsewhere classified: Secondary | ICD-10-CM

## 2021-05-07 DIAGNOSIS — M79662 Pain in left lower leg: Secondary | ICD-10-CM

## 2021-05-07 DIAGNOSIS — N130 Hydronephrosis with ureteropelvic junction obstruction: Secondary | ICD-10-CM | POA: Diagnosis not present

## 2021-05-07 DIAGNOSIS — Z125 Encounter for screening for malignant neoplasm of prostate: Secondary | ICD-10-CM | POA: Diagnosis not present

## 2021-05-07 DIAGNOSIS — M6281 Muscle weakness (generalized): Secondary | ICD-10-CM

## 2021-05-07 DIAGNOSIS — R293 Abnormal posture: Secondary | ICD-10-CM

## 2021-05-07 NOTE — Patient Instructions (Signed)
Access Code: [email or web address] URL: [email or web address] Date: 05/07/2021 Prepared by: Vladimir Faster  Exercises added wide stance head motions eyes open - 1 x daily - 4 x weekly - 1 sets - 10 reps - 2 seconds hold Feet Apart with Eyes Closed with Head Motions - 1 x daily - 4 x weekly - 1 sets - 10 reps - 2 seconds hold Wide stance on Foam Pad head movements - 1 x daily - 4 x weekly - 1 sets - 10 reps - 2 seconds hold

## 2021-05-07 NOTE — Therapy (Signed)
[facility] Therapy [address] [place], [place], [address] Phone: [phone]   Fax:  [phone]  Physical Therapy Treatment  Patient Details  Name: Anthony Park MRN: [id number] Date of Birth: [birth year] Referring Provider (PT): Harold Barban, MD   Encounter Date: 05/07/2021   PT End of Session - 05/07/21 0757     Visit Number 5    Number of Visits 25    Date for PT Re-Evaluation 07/17/21    Authorization Type [employer] comm ppo    Authorization Time Period DED MET AND OOP MET, 120 Visits PT/OT/ST COMBINED,Remaining 116 Visits    Authorization - Visit Number 5    Authorization - Number of Visits 116    PT Start Time 0758    PT Stop Time 0845    PT Time Calculation (min) 47 min    Equipment Utilized During Treatment Gait belt    Activity Tolerance Patient tolerated treatment well;Patient limited by pain    Behavior During Therapy WFL for tasks assessed/performed             Past Medical History:  Diagnosis Date   Arthritis    Diabetes mellitus without complication ([place])    type 2 controlled with diet    Diabetic toe ulcer ([place]) 11/05/2018   History of kidney stones    passed 1 several years ago   Hyperlipidemia    Hypertension    not on medications   Peripheral vascular disease ([place])    stent in left leg due to anerysm     Past Surgical History:  Procedure Laterality Date    Left Transmetatarsal Ampuation (Left Foot)  08/20/2020   ABDOMINAL AORTOGRAM W/LOWER EXTREMITY N/A 08/06/2020   Procedure: ABDOMINAL AORTOGRAM W/LOWER EXTREMITY;  Surgeon: Marty Heck, MD;  Location: [place];  Service: Cardiovascular;  Laterality: N/A;   AMPUTATION Right 11/07/2018   Procedure: RIGHT 3RD TOE AMPUTATION;  Surgeon: Newt Minion, MD;  Location: [place];  Service: Orthopedics;  Laterality: Right;   AMPUTATION Left 08/20/2020   Procedure: Left Transmetatarsal Ampuation;  Surgeon: Marty Heck, MD;  Location: [place];   Service: Vascular;  Laterality: Left;   AMPUTATION Left 10/03/2020   Procedure: LEFT BELOW KNEE AMPUTATION;  Surgeon: Marty Heck, MD;  Location: [place];  Service: Vascular;  Laterality: Left;   AMPUTATION TOE Left    big toe and one next to it   CATARACT EXTRACTION Bilateral    EYE SURGERY Bilateral    cataracts removed   PERIPHERAL VASCULAR BALLOON ANGIOPLASTY Left 08/06/2020   Procedure: PERIPHERAL VASCULAR BALLOON ANGIOPLASTY;  Surgeon: Marty Heck, MD;  Location: [place] [facility];  Service: Cardiovascular;  Laterality: Left;  Peroneal artery.   TOE AMPUTATION Left 2015   TOTAL HIP ARTHROPLASTY Left 05/23/2020   Procedure: LEFT TOTAL HIP ARTHROPLASTY ANTERIOR APPROACH;  Surgeon: Mcarthur Rossetti, MD;  Location: [facility];  Service: Orthopedics;  Laterality: Left;   WOUND DEBRIDEMENT Left 09/11/2020   Procedure: DEBRIDEMENT OF LEFT TRANSMETATARSAL WOUND;  Surgeon: Cherre Robins, MD;  Location: [place];  Service: Vascular;  Laterality: Left;    There were no vitals filed for this visit.   Subjective Assessment - 05/07/21 0758     Subjective He wore prosthesis 6hrs total for last 2 days and it's feeling better.    Pertinent History Left TTTA, left THA (05/23/20), arthritis, DM2, HTN, HLD, PVD    Patient Stated Goals Wants to use prosthesis to return to some  work, get back in community, be mobile, work in gym in his garage    Currently in Pain? Yes    Pain Score 4    last night 6-7/10   Pain Location Hip    Pain Orientation Right    Pain Descriptors / Indicators Aching;Stabbing    Pain Type Chronic pain    Pain Onset 1 to 4 weeks ago    Aggravating Factors  sleeping unable to get in comfortable position    Pain Relieving Factors tylenol, rubbing, ice    Effect of Pain on Daily Activities sleeping                               [facility] Adult PT Treatment/Exercise - 05/07/21 0758       Transfers   Transfers Sit to Stand;Stand to Sit    Sit  to Stand 5: Supervision;With upper extremity assist;From chair/3-in-1;Other (comment)   to locked rollator walker   Sit to Stand Details Visual cues/gestures for sequencing;Verbal cues for technique    Stand to Sit 5: Supervision;With upper extremity assist;To chair/3-in-1;Other (comment)   from rollator walker   Stand to Sit Details (indicate cue type and reason) Verbal cues for technique;Visual cues/gestures for sequencing      Ambulation/Gait   Ambulation/Gait Yes    Ambulation/Gait Assistance 5: Supervision    Ambulation Distance (Feet) 150 Feet   150' X 2 enter & exit builiding   Assistive device Prosthesis;Rollator    Gait Comments --      Self-Care   Self-Care ADL's    ADL's PT instructed in positioning to sleep with pillow between LEs in sidelying & "tenting" off feet.  Pt verbalized understanding.      Exercises   Exercises Knee/Hip      Knee/Hip Exercises: Aerobic   Nustep Seat 12 level 5 with BUEs & BLEs for 10 min      Knee/Hip Exercises: Standing   Forward Step Up Both;1 set;5 reps;Hand Hold: 2;Step Height: 6"    Forward Step Up Limitations demo & verbal cues on technique    Step Down Both;1 set;5 reps;Hand Hold: 2;Step Height: 6"    Step Down Limitations demo & verbal cues on technique    Rocker Board 1 minute   ant/level/post & right/level/left, BUE support goal light as possible   Manufacturing systems engineer Limitations demo & verbal cues on technique      Knee/Hip Exercises: Seated   Sit to Sand 1 set;10 reps;without UE support   from 24" bar stool, demo & verbal cues on technique including equal weight bearing     Prosthetics   Prosthetic Care Comments  PT recommended continuing total wear time to 6hrs.  If wearing >2hrs he needs to check if Vivewear shrinker is damp.    Current prosthetic wear tolerance (days/week)  daily    Current prosthetic wear tolerance (#hours/day)  4 hrs total (2hrs 2x/day or 1hr 4x/day)    Current prosthetic weight-bearing tolerance (hours/day)   Patient tolerates standing with partial weight on prosthesis with residual limb pain 4/10.  He reports pain 7-8/10 with wear >15 min at home.    Edema pitting    Residual limb condition  58mm wound that scab fell off on incision midline,  good hair growth proximal to incision, red shiny skin distal to incision, normal temperature, cylinderical shape    Education Provided Skin check;Residual limb care;Correct ply sock adjustment;Other (comment);Proper wear schedule/adjustment  see prosthetic care comments   Person(s) Educated Patient    Education Method Explanation;Verbal cues    Education Method Verbalized understanding    Donning Prosthesis Modified independent (device/increased time)                 Balance Exercises - 05/07/21 0758       Balance Exercises: Standing   Standing Eyes Opened Wide ([place]);Foam/compliant surface;Head turns;5 reps   4 directions   Standing Eyes Opened Limitations visual, tactile & verbal cues on balance reactions    Standing Eyes Closed Wide ([place]);Solid surface;Head turns;5 reps   4 directions   Standing Eyes Closed Limitations tactile & verbal cues on balance reactions               PT Education - 05/07/21 0825     Education Details Access Code: [email or web address] corner balance    Person(s) Educated Patient    Methods Explanation;Verbal cues;Handout;Demonstration;Tactile cues    Comprehension Verbalized understanding;Returned demonstration;Verbal cues required;Tactile cues required;Need further instruction              PT Short Term Goals - 04/21/21 1736       PT SHORT TERM GOAL #1   Title Patient donnes prosthesis modified independent & verbalizes proper cleaning.    Time 4    Period Weeks    Status New    Target Date 05/21/21      PT SHORT TERM GOAL #2   Title Patient tolerates prosthesis >10 hrs total /day without increase in skin issues or limb pain <5/10 after standing.    Time 4    Period Weeks    Status New    Target Date  05/21/21      PT SHORT TERM GOAL #3   Title Patient able to pick up items from floor & reach 10" with RW support safely.    Time 4    Period Weeks    Status New    Target Date 05/21/21      PT SHORT TERM GOAL #4   Title Patient ambulates 100' with RW & prosthesis with supervision.    Time 4    Period Weeks    Status New    Target Date 05/21/21      PT SHORT TERM GOAL #5   Title Patient negotiates ramps & curbs with RW & prosthesis with minA.    Time 4    Period Weeks    Status New    Target Date 05/21/21               PT Long Term Goals - 04/21/21 1734       PT LONG TERM GOAL #1   Title Patient demonstrates & verbalized understanding of prosthetic care to enable safe utilization of prosthesis.    Time 12    Period Weeks    Status New    Target Date 07/17/21      PT LONG TERM GOAL #2   Title Patient tolerates prosthesis wear for >90% of awake hours without skin issues & limb pain </= 2/10.    Time 12    Period Weeks    Status New    Target Date 07/17/21      PT LONG TERM GOAL #3   Title Berg Balance >/= 36/56 to indicate lower fall risk    Time 12    Period Weeks    Status New    Target Date 07/17/21      PT LONG  TERM GOAL #4   Title Patient ambulates 300' with LRAD & prosthesis modified independent    Time 12    Period Weeks    Status New    Target Date 07/17/21      PT LONG TERM GOAL #5   Title Patient negotiates ramps, curbs & stairs with LRAD & prosthesis modified independent.    Time 12    Period Weeks    Status New    Target Date 07/17/21                   Plan - 05/07/21 0758     Clinical Impression Statement Patient reported that Nustep loosened his hip.  He is improving balance with stationary activities.    Personal Factors and Comorbidities Comorbidity 3+;Fitness;Time since onset of injury/illness/exacerbation;Past/Current Experience    Comorbidities Left TTTA, left THA (05/23/20), arthritis, DM2, HTN, HLD, PVD     Examination-Activity Limitations Locomotion Level;Squat;Stairs;Stand;Transfers    Examination-Participation Restrictions Community Activity;Occupation    Stability/Clinical Decision Making Evolving/Moderate complexity    Rehab Potential Good    PT Frequency 2x / week    PT Duration 12 weeks    PT Treatment/Interventions ADLs/Self Care Home Management;DME Instruction;Gait training;Stair training;Functional mobility training;Therapeutic activities;Therapeutic exercise;Balance training;Neuromuscular re-education;Patient/family education;Prosthetic Training;Vestibular    PT Next Visit Plan check wound, instruct how to pick up item from floor, prosthetic gait with rollator walker including ramps & curbs. Balance activities, therapeutic exercise    PT Home Exercise Plan Access Code: [email or web address]  + sink HEP    Consulted and Agree with Plan of Care Patient             Patient will benefit from skilled therapeutic intervention in order to improve the following deficits and impairments:  Abnormal gait, Decreased activity tolerance, Decreased balance, Decreased endurance, Decreased knowledge of use of DME, Decreased mobility, Decreased range of motion, Decreased skin integrity, Decreased strength, Increased edema, Postural dysfunction, Prosthetic Dependency, Pain  Visit Diagnosis: Other abnormalities of gait and mobility  Pain in left lower leg  Unsteadiness on feet  Stiffness of left knee, not elsewhere classified  Muscle weakness (generalized)  Abnormal posture     Problem List Patient Active Problem List   Diagnosis Date Noted   S/P BKA (below knee amputation) unilateral, left ([place]) 10/03/2020   Non-healing wound of lower extremity 10/03/2020   History of amputation of left foot through metatarsal bone ([place]) 09/11/2020   PAD (peripheral artery disease) ([place]) 08/20/2020   Status post total replacement of left hip 06/05/2020   Unilateral primary osteoarthritis, right hip 04/17/2020    Unilateral primary osteoarthritis, left hip 04/17/2020   Amputated toe, right ([place]) 11/22/2018   Type 2 diabetes mellitus with foot ulcer, without long-term current use of insulin ([place]) 11/22/2018   Essential hypertension 11/22/2018   Mixed hyperlipidemia 11/22/2018   Morbid obesity ([place]) 11/22/2018   Aneurysm of left popliteal artery ([place]) 11/22/2018   Status post peripheral artery angioplasty with insertion of stent 11/22/2018   Encounter for smoking cessation counseling 11/22/2018   Osteomyelitis of third toe of right foot ([place])    Cellulitis of third toe of right foot     Jamey Reas, PT, DPT 05/07/2021, 9:23 AM  [facility] [address] [place], [place], [address] Phone: [phone]   Fax:  [phone]  Name: Anthony Park MRN: [id number] Date of Birth: November 14, 1960

## 2021-05-12 ENCOUNTER — Other Ambulatory Visit: Payer: Self-pay

## 2021-05-12 ENCOUNTER — Ambulatory Visit (INDEPENDENT_AMBULATORY_CARE_PROVIDER_SITE_OTHER): Payer: BC Managed Care – PPO | Admitting: Physical Therapy

## 2021-05-12 ENCOUNTER — Encounter: Payer: Self-pay | Admitting: Physical Therapy

## 2021-05-12 DIAGNOSIS — R2689 Other abnormalities of gait and mobility: Secondary | ICD-10-CM

## 2021-05-12 DIAGNOSIS — M6281 Muscle weakness (generalized): Secondary | ICD-10-CM

## 2021-05-12 DIAGNOSIS — M25662 Stiffness of left knee, not elsewhere classified: Secondary | ICD-10-CM | POA: Diagnosis not present

## 2021-05-12 DIAGNOSIS — R293 Abnormal posture: Secondary | ICD-10-CM

## 2021-05-12 DIAGNOSIS — R2681 Unsteadiness on feet: Secondary | ICD-10-CM

## 2021-05-12 NOTE — Therapy (Signed)
[facility] [address] [place], [place], [address] Phone: [phone]   Fax:  [phone]  Physical Therapy Treatment  Patient Details  Name: Anthony Park MRN: [id number] Date of Birth: [birth year] Referring Provider (PT): Harold Barban, MD   Encounter Date: 05/12/2021   PT End of Session - 05/12/21 0853     Visit Number 6    Number of Visits 25    Date for PT Re-Evaluation 07/17/21    Authorization Type [employer] comm ppo    Authorization Time Period DED MET AND OOP MET, 120 Visits PT/OT/ST COMBINED,Remaining 116 Visits    Authorization - Visit Number 5    Authorization - Number of Visits 116    PT Start Time 0845    PT Stop Time 0930    PT Time Calculation (min) 45 min    Equipment Utilized During Treatment Gait belt    Activity Tolerance Patient tolerated treatment well;Patient limited by pain    Behavior During Therapy WFL for tasks assessed/performed             Past Medical History:  Diagnosis Date   Arthritis    Diabetes mellitus without complication ([place])    type 2 controlled with diet    Diabetic toe ulcer ([place]) 11/05/2018   History of kidney stones    passed 1 several years ago   Hyperlipidemia    Hypertension    not on medications   Peripheral vascular disease ([place])    stent in left leg due to anerysm     Past Surgical History:  Procedure Laterality Date    Left Transmetatarsal Ampuation (Left Foot)  08/20/2020   ABDOMINAL AORTOGRAM W/LOWER EXTREMITY N/A 08/06/2020   Procedure: ABDOMINAL AORTOGRAM W/LOWER EXTREMITY;  Surgeon: Marty Heck, MD;  Location: [facility];  Service: Cardiovascular;  Laterality: N/A;   AMPUTATION Right 11/07/2018   Procedure: RIGHT 3RD TOE AMPUTATION;  Surgeon: Newt Minion, MD;  Location: [place];  Service: Orthopedics;  Laterality: Right;   AMPUTATION Left 08/20/2020   Procedure: Left Transmetatarsal Ampuation;  Surgeon: Marty Heck, MD;  Location: [place];   Service: Vascular;  Laterality: Left;   AMPUTATION Left 10/03/2020   Procedure: LEFT BELOW KNEE AMPUTATION;  Surgeon: Marty Heck, MD;  Location: [facility];  Service: Vascular;  Laterality: Left;   AMPUTATION TOE Left    big toe and one next to it   CATARACT EXTRACTION Bilateral    EYE SURGERY Bilateral    cataracts removed   PERIPHERAL VASCULAR BALLOON ANGIOPLASTY Left 08/06/2020   Procedure: PERIPHERAL VASCULAR BALLOON ANGIOPLASTY;  Surgeon: Marty Heck, MD;  Location: [facility];  Service: Cardiovascular;  Laterality: Left;  Peroneal artery.   TOE AMPUTATION Left 2015   TOTAL HIP ARTHROPLASTY Left 05/23/2020   Procedure: LEFT TOTAL HIP ARTHROPLASTY ANTERIOR APPROACH;  Surgeon: Mcarthur Rossetti, MD;  Location: [facility];  Service: Orthopedics;  Laterality: Left;   WOUND DEBRIDEMENT Left 09/11/2020   Procedure: DEBRIDEMENT OF LEFT TRANSMETATARSAL WOUND;  Surgeon: Cherre Robins, MD;  Location: [place];  Service: Vascular;  Laterality: Left;    There were no vitals filed for this visit.   Subjective Assessment - 05/12/21 0845     Subjective [place]  He wore prosthesis 8hrs total between 2.    Pertinent History Left TTTA, left THA (05/23/20), arthritis, DM2, HTN, HLD, PVD    Patient Stated Goals Wants to use prosthesis to return  to some work, get back in community, be mobile, work in gym in his garage    Currently in Pain? Yes    Pain Score 2    in last week, best 0/10 & worst 8-9/10   Pain Location Hip    Pain Orientation Right    Pain Descriptors / Indicators Aching;Sore    Pain Type Chronic pain    Pain Onset More than a month ago    Pain Frequency Intermittent    Aggravating Factors  standing too long, arthritic,    Pain Relieving Factors tylenol, rubbing, ice                               [facility] Adult PT Treatment/Exercise - 05/12/21 0845       Transfers   Transfers Sit to Stand;Stand to Sit     Sit to Stand 5: Supervision;With upper extremity assist;From chair/3-in-1;Other (comment)   to locked rollator walker   Sit to Stand Details Visual cues/gestures for sequencing;Verbal cues for technique    Stand to Sit 5: Supervision;With upper extremity assist;To chair/3-in-1;Other (comment)   from rollator walker   Stand to Sit Details (indicate cue type and reason) Verbal cues for technique;Visual cues/gestures for sequencing      Ambulation/Gait   Ambulation/Gait Yes    Ambulation/Gait Assistance 5: Supervision    Ambulation Distance (Feet) 150 Feet   150' X 2 enter & exit builiding   Assistive device Prosthesis;Rollator      Self-Care   Self-Care ADL's    ADL's PT instructed in positioning to sleep with pillow between LEs in sidelying & "tenting" off feet.  Pt verbalized understanding.      Exercises   Exercises Knee/Hip      Knee/Hip Exercises: Aerobic   Recumbent Bike PT verbal & demo cues on set up to ride bike with TTA prosthesis.  He feels pinching in popliteal area with knee flexion.  He may benefit from increasing hamstring relieves & rolling posterior brim.  pt verbalized understanding & will discuss with prosthetist.    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    Current prosthetic wear tolerance (days/week)  daily    Current prosthetic wear tolerance (#hours/day)  8 hrs total (4hrs 2x/day).    Current prosthetic weight-bearing tolerance (hours/day)  Patient tolerates standing with partial weight on prosthesis with residual limb pain 4/10.  He reports pain 7-8/10 with wear >15 min at home.    Edema pitting    Residual limb condition  34m wound that scab fell off on incision midline now granulation present,  good hair growth proximal to incision, red shiny skin distal to incision, normal temperature, cylinderical shape    Education Provided Skin check;Residual limb care;Correct ply sock adjustment;Other (comment);Proper wear schedule/adjustment   see prosthetic care comments   Person(s) Educated Patient    Education Method Explanation;Tactile cues;Verbal cues    Education Method Verbalized understanding;Tactile cues required;Verbal cues required;Needs further instruction                 Balance Exercises - 05/12/21 0845       Balance Exercises: Standing   Standing Eyes Opened Wide (BOA);Foam/compliant surface;Head turns;5 reps   4 directions, crossways on foam beam   Standing Eyes Opened Limitations visual, tactile & verbal cues on balance reactions    Standing Eyes Closed Wide (BOA);Solid surface;Head turns;5 reps   4 directions   Standing Eyes Closed Limitations tactile & verbal cues on balance reactions                 PT Short Term Goals - 04/21/21 1736       PT SHORT TERM GOAL #1   Title Patient donnes prosthesis modified independent & verbalizes proper cleaning.    Time 4    Period Weeks    Status New    Target Date 05/21/21      PT SHORT TERM GOAL #2   Title Patient tolerates prosthesis >10 hrs total /day without increase in skin issues or limb pain <5/10 after standing.    Time 4    Period Weeks    Status New    Target Date 05/21/21      PT SHORT TERM GOAL #3   Title Patient able to pick up items from  floor & reach 10" with RW support safely.    Time 4    Period Weeks    Status New    Target Date 05/21/21      PT SHORT TERM GOAL #4   Title Patient ambulates 100' with RW & prosthesis with supervision.    Time 4    Period Weeks    Status New    Target Date 05/21/21      PT SHORT TERM GOAL #5   Title Patient negotiates ramps & curbs with RW & prosthesis with minA.    Time 4    Period Weeks    Status New    Target Date 05/21/21               PT Long Term Goals - 04/21/21 1734       PT LONG TERM GOAL #1   Title Patient demonstrates & verbalized understanding of prosthetic care to enable safe utilization of prosthesis.    Time 12    Period Weeks    Status New    Target Date 07/17/21      PT LONG TERM GOAL #2   Title Patient tolerates prosthesis wear for >90% of awake hours without skin issues & limb pain </= 2/10.    Time 12    Period Weeks    Status New    Target Date 07/17/21      PT LONG TERM GOAL #3   Title Berg Balance >/= 36/56 to indicate lower fall risk    Time 12    Period Weeks    Status New    Target Date 07/17/21      PT LONG TERM GOAL #4   Title Patient ambulates 300' with LRAD & prosthesis modified independent    Time  12    Period Weeks    Status New    Target Date 07/17/21      PT LONG TERM GOAL #5   Title Patient negotiates ramps, curbs & stairs with LRAD & prosthesis modified independent.    Time 12    Period Weeks    Status New    Target Date 07/17/21                   Plan - 05/12/21 0854     Clinical Impression Statement Patient is improving his prosthesis wear tolerance which is enabling increased activity level.  PT session working on balance & function motions with right hip & left prosthesis.    Personal Factors and Comorbidities Comorbidity 3+;Fitness;Time since onset of injury/illness/exacerbation;Past/Current Experience    Comorbidities Left TTTA, left THA (05/23/20), arthritis, DM2, HTN, HLD, PVD     Examination-Activity Limitations Locomotion Level;Squat;Stairs;Stand;Transfers    Examination-Participation Restrictions Community Activity;Occupation    Stability/Clinical Decision Making Evolving/Moderate complexity    Rehab Potential Good    PT Frequency 2x / week    PT Duration 12 weeks    PT Treatment/Interventions ADLs/Self Care Home Management;DME Instruction;Gait training;Stair training;Functional mobility training;Therapeutic activities;Therapeutic exercise;Balance training;Neuromuscular re-education;Patient/family education;Prosthetic Training;Vestibular    PT Next Visit Plan check wound, instruct how to pick up item from floor, prosthetic gait with rollator walker including ramps & curbs. Balance activities, therapeutic exercise    PT Home Exercise Plan Access Code: [email or web address]  + sink HEP    Consulted and Agree with Plan of Care Patient             Patient will benefit from skilled therapeutic intervention in order to improve the following deficits and impairments:  Abnormal gait, Decreased activity tolerance, Decreased balance, Decreased endurance, Decreased knowledge of use of DME, Decreased mobility, Decreased range of motion, Decreased skin integrity, Decreased strength, Increased edema, Postural dysfunction, Prosthetic Dependency, Pain  Visit Diagnosis: Other abnormalities of gait and mobility  Unsteadiness on feet  Stiffness of left knee, not elsewhere classified  Muscle weakness (generalized)  Abnormal posture     Problem List Patient Active Problem List   Diagnosis Date Noted   S/P BKA (below knee amputation) unilateral, left ([place]) 10/03/2020   Non-healing wound of lower extremity 10/03/2020   History of amputation of left foot through metatarsal bone ([place]) 09/11/2020   PAD (peripheral artery disease) ([place]) 08/20/2020   Status post total replacement of left hip 06/05/2020   Unilateral primary osteoarthritis, right hip 04/17/2020   Unilateral primary  osteoarthritis, left hip 04/17/2020   Amputated toe, right ([place]) 11/22/2018   Type 2 diabetes mellitus with foot ulcer, without long-term current use of insulin ([place]) 11/22/2018   Essential hypertension 11/22/2018   Mixed hyperlipidemia 11/22/2018   Morbid obesity ([place]) 11/22/2018   Aneurysm of left popliteal artery ([place]) 11/22/2018   Status post peripheral artery angioplasty with insertion of stent 11/22/2018   Encounter for smoking cessation counseling 11/22/2018   Osteomyelitis of third toe of right foot ([place])    Cellulitis of third toe of right foot     Jamey Reas, PT, DPT 05/12/2021, 9:43 AM  [facility] [address] [place], [place], [address] Phone: [phone]   Fax:  [phone]  Name: SAADIQ POCHE MRN: [id number] Date of Birth: 1960/12/10

## 2021-05-14 ENCOUNTER — Ambulatory Visit (INDEPENDENT_AMBULATORY_CARE_PROVIDER_SITE_OTHER): Payer: BC Managed Care – PPO | Admitting: Physical Therapy

## 2021-05-14 ENCOUNTER — Encounter: Payer: Self-pay | Admitting: Physical Therapy

## 2021-05-14 ENCOUNTER — Other Ambulatory Visit: Payer: Self-pay

## 2021-05-14 DIAGNOSIS — R2681 Unsteadiness on feet: Secondary | ICD-10-CM

## 2021-05-14 DIAGNOSIS — M6281 Muscle weakness (generalized): Secondary | ICD-10-CM | POA: Diagnosis not present

## 2021-05-14 DIAGNOSIS — M79662 Pain in left lower leg: Secondary | ICD-10-CM

## 2021-05-14 DIAGNOSIS — M25662 Stiffness of left knee, not elsewhere classified: Secondary | ICD-10-CM

## 2021-05-14 DIAGNOSIS — R293 Abnormal posture: Secondary | ICD-10-CM

## 2021-05-14 DIAGNOSIS — R2689 Other abnormalities of gait and mobility: Secondary | ICD-10-CM

## 2021-05-14 NOTE — Therapy (Signed)
[facility] [address] [place], [place], [address] Phone: [phone]   Fax:  [phone]  Physical Therapy Treatment  Patient Details  Name: Anthony Park MRN: [id number] Date of Birth: [birth year] Referring Provider (PT): Harold Barban, MD   Encounter Date: 05/14/2021   PT End of Session - 05/14/21 0758     Visit Number 7    Number of Visits 25    Date for PT Re-Evaluation 07/17/21    Authorization Type [employer] comm ppo    Authorization Time Period DED MET AND OOP MET, 120 Visits PT/OT/ST COMBINED,Remaining 116 Visits    Authorization - Visit Number 7    Authorization - Number of Visits 116    PT Start Time 0758    PT Stop Time 0845    PT Time Calculation (min) 47 min    Equipment Utilized During Treatment Gait belt    Activity Tolerance Patient tolerated treatment well;Patient limited by pain    Behavior During Therapy WFL for tasks assessed/performed             Past Medical History:  Diagnosis Date   Arthritis    Diabetes mellitus without complication ([place])    type 2 controlled with diet    Diabetic toe ulcer ([place]) 11/05/2018   History of kidney stones    passed 1 several years ago   Hyperlipidemia    Hypertension    not on medications   Peripheral vascular disease ([place])    stent in left leg due to anerysm     Past Surgical History:  Procedure Laterality Date    Left Transmetatarsal Ampuation (Left Foot)  08/20/2020   ABDOMINAL AORTOGRAM W/LOWER EXTREMITY N/A 08/06/2020   Procedure: ABDOMINAL AORTOGRAM W/LOWER EXTREMITY;  Surgeon: Marty Heck, MD;  Location: [place] [facility];  Service: Cardiovascular;  Laterality: N/A;   AMPUTATION Right 11/07/2018   Procedure: RIGHT 3RD TOE AMPUTATION;  Surgeon: Newt Minion, MD;  Location: [place];  Service: Orthopedics;  Laterality: Right;   AMPUTATION Left 08/20/2020   Procedure: Left Transmetatarsal Ampuation;  Surgeon: Marty Heck, MD;  Location: [place];  Service:  Vascular;  Laterality: Left;   AMPUTATION Left 10/03/2020   Procedure: LEFT BELOW KNEE AMPUTATION;  Surgeon: Marty Heck, MD;  Location: [place];  Service: Vascular;  Laterality: Left;   AMPUTATION TOE Left    big toe and one next to it   CATARACT EXTRACTION Bilateral    EYE SURGERY Bilateral    cataracts removed   PERIPHERAL VASCULAR BALLOON ANGIOPLASTY Left 08/06/2020   Procedure: PERIPHERAL VASCULAR BALLOON ANGIOPLASTY;  Surgeon: Marty Heck, MD;  Location: [place] [facility];  Service: Cardiovascular;  Laterality: Left;  Peroneal artery.   TOE AMPUTATION Left 2015   TOTAL HIP ARTHROPLASTY Left 05/23/2020   Procedure: LEFT TOTAL HIP ARTHROPLASTY ANTERIOR APPROACH;  Surgeon: Mcarthur Rossetti, MD;  Location: [facility];  Service: Orthopedics;  Laterality: Left;   WOUND DEBRIDEMENT Left 09/11/2020   Procedure: DEBRIDEMENT OF LEFT TRANSMETATARSAL WOUND;  Surgeon: Cherre Robins, MD;  Location: [place];  Service: Vascular;  Laterality: Left;    There were no vitals filed for this visit.   Subjective Assessment - 05/14/21 0759     Subjective He has appt with Hubbard Hartshorn, CPO after PT today.  He wore prosthesis on Tuesday for 10 hours & on feet >6 hours then yesterday total 5-6 hours and was on feet 3 hrs. He had limb pain from Tuesday.  Pertinent History Left TTTA, left THA (05/23/20), arthritis, DM2, HTN, HLD, PVD    Patient Stated Goals Wants to use prosthesis to return to some work, get back in community, be mobile, work in gym in his garage    Currently in Pain? Yes    Pain Score 5    fluctuates from 0/10 up to 7-8/10   Pain Location Hip    Pain Orientation Right    Pain Descriptors / Indicators Sore;Aching    Pain Type Chronic pain   arthritic   Pain Onset More than a month ago    Pain Frequency Intermittent    Aggravating Factors  standing too long    Pain Relieving Factors get off of it.                               [place] [facility]  Treatment/Exercise - 05/14/21 0759       Transfers   Transfers Sit to Stand;Stand to Sit    Sit to Stand 5: Supervision;With upper extremity assist;From chair/3-in-1;Other (comment)   to locked rollator walker   Sit to Stand Details Visual cues/gestures for sequencing;Verbal cues for technique    Stand to Sit 5: Supervision;With upper extremity assist;To chair/3-in-1;Other (comment)   from rollator walker   Stand to Sit Details (indicate cue type and reason) Verbal cues for technique;Visual cues/gestures for sequencing      Ambulation/Gait   Ambulation/Gait Yes    Ambulation/Gait Assistance 5: Supervision;3: Mod assist   supervision / cues only & modA cane [place]   Ambulation/Gait Assistance Details verbal & tactile cues on sequence & technique.  PT demo walking around table with LUE on table focusing on decreasing WBing over time.  pt verbalized understanding.    Ambulation Distance (Feet) 150 Feet   150' X 2 enter & exit builiding rollator and 40' cane (15' w/o HHA, then 23' w/HHA)   Assistive device Prosthesis;Rollator      Self-Care   Self-Care --    ADL's --      Exercises   Exercises Knee/Hip      Knee/Hip Exercises: Aerobic   Recumbent Bike --    Nustep Seat 12 level 5 with BUEs & BLEs for 10 min      Knee/Hip Exercises: Machines for Strengthening   Total Gym Leg Press Shuttle leg press 125# 15 reps 2 sets 1st set back 45* & 2nd set back flat      Knee/Hip Exercises: Standing   Forward Step Up Both;1 set;5 reps;Hand Hold: 2;Step Height: 6"    Forward Step Up Limitations demo & verbal cues on technique    Step Down Both;1 set;5 reps;Hand Hold: 2;Step Height: 6"    Step Down Limitations demo & verbal cues on technique    Rocker Board 1 minute   round board w/single pivot, ant/level/post & right/level/left, BUE support goal light as possible   D[employer] Services operational officer Limitations demo & verbal cues on technique      Knee/Hip Exercises: Seated   Sit to Sand 1 set;10 reps;without UE  support   from 24" bar stool, demo & verbal cues on technique including equal weight bearing     Prosthetics   Prosthetic Care Comments  PT recommending pretibial pads put thinner than last time. PT instructed in use of cutoff socks to increase distal limb.    Current prosthetic wear tolerance (days/week)  daily    Current prosthetic wear tolerance (#hours/day)  8-10 hrs total (4-5 hrs 2x/day).    Current prosthetic weight-bearing tolerance (hours/day)  Patient tolerates standing with partial weight on prosthesis with residual limb pain 4/10.  He reports pain 7-8/10 with wear >15 min at home.    Edema pitting    Residual limb condition  74m wound that scab fell off on incision midline now granulation present,  good hair growth proximal to incision, red shiny skin distal to incision, normal temperature, cylinderical shape    Education Provided Skin check;Residual limb care;Correct ply sock adjustment;Other (comment);Proper wear schedule/adjustment   see prosthetic care comments   Person(s) Educated Patient    Education Method Explanation;Verbal cues    Education Method Verbalized understanding;Verbal cues required;Needs further instruction                 Balance Exercises - 05/14/21 0800       Balance Exercises: Standing   Standing Eyes Opened Wide ([place];Foam/compliant surface;Head turns;5 reps   4 directions, crossways on foam beam   Standing Eyes Opened Limitations visual, tactile & verbal cues on balance reactions    Standing Eyes Closed Wide (BOA);Solid surface;Head turns;5 reps   4 directions   Standing Eyes Closed Limitations tactile & verbal cues on balance reactions                 PT Short Term Goals - 04/21/21 1736       PT SHORT TERM GOAL #1   Title Patient donnes prosthesis modified independent & verbalizes proper cleaning.    Time 4    Period Weeks    Status New    Target Date 05/21/21      PT SHORT TERM GOAL #2   Title Patient tolerates prosthesis >10  hrs total /day without increase in skin issues or limb pain <5/10 after standing.    Time 4    Period Weeks    Status New    Target Date 05/21/21      PT SHORT TERM GOAL #3   Title Patient able to pick up items from floor & reach 10" with RW support safely.    Time 4    Period Weeks    Status New    Target Date 05/21/21      PT SHORT TERM GOAL #4   Title Patient ambulates 100' with RW & prosthesis with supervision.    Time 4    Period Weeks    Status New    Target Date 05/21/21      PT SHORT TERM GOAL #5   Title Patient negotiates ramps & curbs with RW & prosthesis with minA.    Time 4    Period Weeks    Status New    Target Date 05/21/21               PT Long Term Goals - 04/21/21 1734       PT LONG TERM GOAL #1   Title Patient demonstrates & verbalized understanding of prosthetic care to enable safe utilization of prosthesis.    Time 12    Period Weeks    Status New    Target Date 07/17/21      PT LONG TERM GOAL #2   Title Patient tolerates prosthesis wear for >90% of awake hours without skin issues & limb pain </= 2/10.    Time 12    Period Weeks    Status New    Target Date 07/17/21      PT LONG TERM  GOAL #3   Title Berg Balance >/= 36/56 to indicate lower fall risk    Time 12    Period Weeks    Status New    Target Date 07/17/21      PT LONG TERM GOAL #4   Title Patient ambulates 300' with LRAD & prosthesis modified independent    Time 12    Period Weeks    Status New    Target Date 07/17/21      PT LONG TERM GOAL #5   Title Patient negotiates ramps, curbs & stairs with LRAD & prosthesis modified independent.    Time 12    Period Weeks    Status New    Target Date 07/17/21                   Plan - 05/14/21 0759     Clinical Impression Statement Patient has improved mobilty with rollator walker. PT introduced cane for gait today but he still requires significant BUE support for gait.  His strength & balance are improving with PT  & exercises at home.    Personal Factors and Comorbidities Comorbidity 3+;Fitness;Time since onset of injury/illness/exacerbation;Past/Current Experience    Comorbidities Left TTTA, left THA (05/23/20), arthritis, DM2, HTN, HLD, PVD    Examination-Activity Limitations Locomotion Level;Squat;Stairs;Stand;Transfers    Examination-Participation Restrictions Community Activity;Occupation    Stability/Clinical Decision Making Evolving/Moderate complexity    Rehab Potential Good    PT Frequency 2x / week    PT Duration 12 weeks    PT Treatment/Interventions ADLs/Self Care Home Management;DME Instruction;Gait training;Stair training;Functional mobility training;Therapeutic activities;Therapeutic exercise;Balance training;Neuromuscular re-education;Patient/family education;Prosthetic Training;Vestibular    PT Next Visit Plan check STGs, check changes prosthetist made,  instruct how to pick up item from floor, prosthetic gait with rollator walker including ramps & curbs. Balance activities, therapeutic exercise    PT Home Exercise Plan Access Code: [email or web address]  + sink HEP    Consulted and Agree with Plan of Care Patient             Patient will benefit from skilled therapeutic intervention in order to improve the following deficits and impairments:  Abnormal gait, Decreased activity tolerance, Decreased balance, Decreased endurance, Decreased knowledge of use of DME, Decreased mobility, Decreased range of motion, Decreased skin integrity, Decreased strength, Increased edema, Postural dysfunction, Prosthetic Dependency, Pain  Visit Diagnosis: Other abnormalities of gait and mobility  Unsteadiness on feet  Stiffness of left knee, not elsewhere classified  Muscle weakness (generalized)  Abnormal posture  Pain in left lower leg     Problem List Patient Active Problem List   Diagnosis Date Noted   S/P BKA (below knee amputation) unilateral, left ([place]) 10/03/2020   Non-healing wound of lower  extremity 10/03/2020   History of amputation of left foot through metatarsal bone ([place]) 09/11/2020   PAD (peripheral artery disease) ([place]) 08/20/2020   Status post total replacement of left hip 06/05/2020   Unilateral primary osteoarthritis, right hip 04/17/2020   Unilateral primary osteoarthritis, left hip 04/17/2020   Amputated toe, right ([place]) 11/22/2018   Type 2 diabetes mellitus with foot ulcer, without long-term current use of insulin ([place]) 11/22/2018   Essential hypertension 11/22/2018   Mixed hyperlipidemia 11/22/2018   Morbid obesity ([place]) 11/22/2018   Aneurysm of left popliteal artery ([place]) 11/22/2018   Status post peripheral artery angioplasty with insertion of stent 11/22/2018   Encounter for smoking cessation counseling 11/22/2018   Osteomyelitis of third toe of right foot ([employer])  Cellulitis of third toe of right foot     Jamey Reas, PT, DPT 05/14/2021, 10:13 AM  [facility] [address] [place], [place], [address] Phone: [phone]   Fax:  [phone]  Name: Anthony Park MRN: [id number] Date of Birth: 12-29-1960

## 2021-05-19 ENCOUNTER — Ambulatory Visit (INDEPENDENT_AMBULATORY_CARE_PROVIDER_SITE_OTHER): Payer: BC Managed Care – PPO | Admitting: Physical Therapy

## 2021-05-19 ENCOUNTER — Encounter: Payer: Self-pay | Admitting: Physical Therapy

## 2021-05-19 ENCOUNTER — Other Ambulatory Visit: Payer: Self-pay

## 2021-05-19 DIAGNOSIS — R2689 Other abnormalities of gait and mobility: Secondary | ICD-10-CM

## 2021-05-19 DIAGNOSIS — R2681 Unsteadiness on feet: Secondary | ICD-10-CM | POA: Diagnosis not present

## 2021-05-19 DIAGNOSIS — M25662 Stiffness of left knee, not elsewhere classified: Secondary | ICD-10-CM

## 2021-05-19 DIAGNOSIS — M6281 Muscle weakness (generalized): Secondary | ICD-10-CM | POA: Diagnosis not present

## 2021-05-19 DIAGNOSIS — R293 Abnormal posture: Secondary | ICD-10-CM

## 2021-05-19 DIAGNOSIS — M79662 Pain in left lower leg: Secondary | ICD-10-CM

## 2021-05-19 NOTE — Therapy (Signed)
[facility] Physical Therapy [address] [place], [place], [address] Phone: [phone]   Fax:  [phone]  Physical Therapy Treatment  Patient Details  Name: Anthony Park MRN: [id number] Date of Birth: [birth year] Referring Provider (PT): Harold Barban, MD   Encounter Date: 05/19/2021   PT End of Session - 05/19/21 0809     Visit Number 8    Number of Visits 25    Date for PT Re-Evaluation 07/17/21    Authorization Type [employer] comm ppo    Authorization Time Period DED MET AND OOP MET, 120 Visits PT/OT/ST COMBINED,Remaining 116 Visits    Authorization - Visit Number 8    Authorization - Number of Visits 116    PT Start Time 0800    PT Stop Time 6063    PT Time Calculation (min) 44 min    Equipment Utilized During Treatment Gait belt    Activity Tolerance Patient tolerated treatment well;Patient limited by pain    Behavior During Therapy WFL for tasks assessed/performed             Past Medical History:  Diagnosis Date   Arthritis    Diabetes mellitus without complication ([place])    type 2 controlled with diet    Diabetic toe ulcer ([place]) 11/05/2018   History of kidney stones    passed 1 several years ago   Hyperlipidemia    Hypertension    not on medications   Peripheral vascular disease ([place])    stent in left leg due to anerysm     Past Surgical History:  Procedure Laterality Date    Left Transmetatarsal Ampuation (Left Foot)  08/20/2020   ABDOMINAL AORTOGRAM W/LOWER EXTREMITY N/A 08/06/2020   Procedure: ABDOMINAL AORTOGRAM W/LOWER EXTREMITY;  Surgeon: Marty Heck, MD;  Location: [facility];  Service: Cardiovascular;  Laterality: N/A;   AMPUTATION Right 11/07/2018   Procedure: RIGHT 3RD TOE AMPUTATION;  Surgeon: Newt Minion, MD;  Location: [facility];  Service: Orthopedics;  Laterality: Right;   AMPUTATION Left 08/20/2020   Procedure: Left Transmetatarsal Ampuation;  Surgeon: Marty Heck, MD;  Location: [facility];  Service:  Vascular;  Laterality: Left;   AMPUTATION Left 10/03/2020   Procedure: LEFT BELOW KNEE AMPUTATION;  Surgeon: Marty Heck, MD;  Location: [facility];  Service: Vascular;  Laterality: Left;   AMPUTATION TOE Left    big toe and one next to it   CATARACT EXTRACTION Bilateral    EYE SURGERY Bilateral    cataracts removed   PERIPHERAL VASCULAR BALLOON ANGIOPLASTY Left 08/06/2020   Procedure: PERIPHERAL VASCULAR BALLOON ANGIOPLASTY;  Surgeon: Marty Heck, MD;  Location: [facility];  Service: Cardiovascular;  Laterality: Left;  Peroneal artery.   TOE AMPUTATION Left 2015   TOTAL HIP ARTHROPLASTY Left 05/23/2020   Procedure: LEFT TOTAL HIP ARTHROPLASTY ANTERIOR APPROACH;  Surgeon: Mcarthur Rossetti, MD;  Location: [facility];  Service: Orthopedics;  Laterality: Left;   WOUND DEBRIDEMENT Left 09/11/2020   Procedure: DEBRIDEMENT OF LEFT TRANSMETATARSAL WOUND;  Surgeon: Cherre Robins, MD;  Location: [facility];  Service: Vascular;  Laterality: Left;    There were no vitals filed for this visit.   Subjective Assessment - 05/19/21 0800     Subjective He saw Hubbard Hartshorn, CPO who added pads which are softer & smaller than last time, lower posterior wall, and alignment change. He was swollen the next day.  He has to remove prosthesis & some times liner too after one hour  first thing in morning feeling muscle fatigue.  He removes prosthesis & liner for 30 min 2-3 times per day.    Pertinent History Left TTTA, left THA (05/23/20), arthritis, DM2, HTN, HLD, PVD    Patient Stated Goals Wants to use prosthesis to return to some work, get back in community, be mobile, work in gym in his garage    Currently in Pain? Yes    Pain Score 3    over last week, ranging 0/10 - 8/10   Pain Location Hip    Pain Orientation Right    Pain Descriptors / Indicators Aching;Sore    Pain Type Chronic pain    Pain Onset More than a month ago    Pain Frequency Intermittent    Aggravating Factors   standing too long    Pain Relieving Factors getting off of prosthesis                               [facility] Adult PT Treatment/Exercise - 05/19/21 0800       Transfers   Transfers Sit to Stand;Stand to Sit    Sit to Stand 5: Supervision;With upper extremity assist;From chair/3-in-1;Other (comment)   to locked rollator walker   Sit to Stand Details Visual cues/gestures for sequencing;Verbal cues for technique    Stand to Sit 5: Supervision;With upper extremity assist;To chair/3-in-1;Other (comment)   from rollator walker   Stand to Sit Details (indicate cue type and reason) Verbal cues for technique;Visual cues/gestures for sequencing      Ambulation/Gait   Ambulation/Gait Yes    Ambulation/Gait Assistance 5: Supervision;4: Min assist   supervision / cues only & minA cane [place]   Ambulation/Gait Assistance Details decreased pressure on PT hand with HHA with cane today.    Ambulation Distance (Feet) 150 Feet   150' X 2 enter & exit builiding rollator and 40' cane (15' w/o HHA, then 109' w/HHA)   Assistive device Prosthesis;Rollator      Exercises   Exercises Knee/Hip      Knee/Hip Exercises: Stretches   Active Hamstring Stretch Both;2 reps;30 seconds    Active Hamstring Stretch Limitations supine SLR w/strap      Knee/Hip Exercises: Aerobic   Nustep Seat 12 level 6 with BUEs & BLEs for 10 min      Knee/Hip Exercises: Machines for Strengthening   Total Gym Leg Press Shuttle leg press 125# 20 reps 2 sets 1st set back 45* & 2nd set back flat      Knee/Hip Exercises: Standing   Forward Step Up Both;1 set;5 reps;Hand Hold: 2;Step Height: 6"    Forward Step Up Limitations demo & verbal cues on technique    Step Down Both;1 set;5 reps;Hand Hold: 2;Step Height: 6"    Step Down Limitations demo & verbal cues on technique    Rocker Board 1 minute   round board w/single pivot, ant/level/post & right/level/left, BUE support goal light as possible   Dipl[employer] Services operational officer Limitations  demo & verbal cues on technique      Knee/Hip Exercises: Seated   Sit to Sand 1 set;10 reps;without UE support   from 24" bar stool, demo & verbal cues on technique including equal weight bearing     Prosthetics   Prosthetic Care Comments  PT instructed in use of cutoff socks to increase distal limb.    Current prosthetic wear tolerance (days/week)  daily    Current prosthetic wear tolerance (#hours/day)  10-12 hrs total (4-6 hrs 2x/day).    Current prosthetic weight-bearing tolerance (hours/day)  Patient tolerates standing with partial weight on prosthesis with residual limb pain 4/10.  He reports pain 7-8/10 with wear >15 min at home.    Edema pitting    Residual limb condition  no open areas, good hair growth proximal to incision, red shiny skin distal to incision, normal temperature, cylinderical shape    Education Provided Skin check;Residual limb care;Correct ply sock adjustment;Other (comment);Proper wear schedule/adjustment   see prosthetic care comments   Person(s) Educated Patient    Education Method Explanation;Demonstration;Verbal cues    Education Method Verbalized understanding;Verbal cues required;Needs further instruction    Donning Prosthesis Modified independent (device/increased time)                      PT Short Term Goals - 05/19/21 8127       PT SHORT TERM GOAL #1   Title Patient donnes prosthesis modified independent & verbalizes proper cleaning.    Time 4    Period Weeks    Status Achieved    Target Date 05/21/21      PT SHORT TERM GOAL #2   Title Patient tolerates prosthesis >10 hrs total /day without increase in skin issues or limb pain <5/10 after standing.    Time 4    Period Weeks    Status Achieved    Target Date 05/21/21      PT SHORT TERM GOAL #3   Title Patient able to pick up items from floor & reach 10" with RW support safely.    Time 4    Period Weeks    Status On-going    Target Date 05/21/21      PT SHORT TERM GOAL #4    Title Patient ambulates 100' with RW & prosthesis with supervision.    Time 4    Period Weeks    Status Achieved    Target Date 05/21/21      PT SHORT TERM GOAL #5   Title Patient negotiates ramps & curbs with RW & prosthesis with minA.    Time 4    Period Weeks    Status Achieved    Target Date 05/21/21               PT Long Term Goals - 05/19/21 0939       PT LONG TERM GOAL #1   Title Patient demonstrates & verbalized understanding of prosthetic care to enable safe utilization of prosthesis.    Time 12    Period Weeks    Status On-going    Target Date 07/17/21      PT LONG TERM GOAL #2   Title Patient tolerates prosthesis wear for >90% of awake hours without skin issues & limb pain </= 2/10.    Time 12    Period Weeks    Status On-going    Target Date 07/17/21      PT LONG TERM GOAL #3   Title Berg Balance >/= 36/56 to indicate lower fall risk    Time 12    Period Weeks    Status On-going    Target Date 07/17/21      PT LONG TERM GOAL #4   Title Patient ambulates 300' with LRAD & prosthesis modified independent    Time 12    Period Weeks    Status On-going    Target Date 07/17/21      PT LONG TERM  GOAL #5   Title Patient negotiates ramps, curbs & stairs with LRAD & prosthesis modified independent.    Time 12    Period Weeks    Status On-going    Target Date 07/17/21                   Plan - 05/19/21 0810     Clinical Impression Statement Patient met 4 STGs checked today and anticipate meeting remaining STG next visit.  He improved prosthetic gait with cane with less HHA.  PT recommended setting up appt with Dr. Ninfa Linden in early Oct to assess right hip & set up plan of care. Pt agreed.    Personal Factors and Comorbidities Comorbidity 3+;Fitness;Time since onset of injury/illness/exacerbation;Past/Current Experience    Comorbidities Left TTTA, left THA (05/23/20), arthritis, DM2, HTN, HLD, PVD    Examination-Activity Limitations Locomotion  Level;Squat;Stairs;Stand;Transfers    Examination-Participation Restrictions Community Activity;Occupation    Stability/Clinical Decision Making Evolving/Moderate complexity    Rehab Potential Good    PT Frequency 2x / week    PT Duration 12 weeks    PT Treatment/Interventions ADLs/Self Care Home Management;DME Instruction;Gait training;Stair training;Functional mobility training;Therapeutic activities;Therapeutic exercise;Balance training;Neuromuscular re-education;Patient/family education;Prosthetic Training;Vestibular    PT Next Visit Plan check remaining STG & set new STGs, check changes prosthetist made, prosthetic gait with cane  Balance activities, therapeutic exercise    PT Home Exercise Plan Access Code: [email or web address]  + sink HEP    Consulted and Agree with Plan of Care Patient             Patient will benefit from skilled therapeutic intervention in order to improve the following deficits and impairments:  Abnormal gait, Decreased activity tolerance, Decreased balance, Decreased endurance, Decreased knowledge of use of DME, Decreased mobility, Decreased range of motion, Decreased skin integrity, Decreased strength, Increased edema, Postural dysfunction, Prosthetic Dependency, Pain  Visit Diagnosis: Other abnormalities of gait and mobility  Unsteadiness on feet  Stiffness of left knee, not elsewhere classified  Muscle weakness (generalized)  Abnormal posture  Pain in left lower leg     Problem List Patient Active Problem List   Diagnosis Date Noted   S/P BKA (below knee amputation) unilateral, left ([place]) 10/03/2020   Non-healing wound of lower extremity 10/03/2020   History of amputation of left foot through metatarsal bone ([place]) 09/11/2020   PAD (peripheral artery disease) ([place]) 08/20/2020   Status post total replacement of left hip 06/05/2020   Unilateral primary osteoarthritis, right hip 04/17/2020   Unilateral primary osteoarthritis, left hip 04/17/2020    Amputated toe, right ([place]) 11/22/2018   Type 2 diabetes mellitus with foot ulcer, without long-term current use of insulin ([place]) 11/22/2018   Essential hypertension 11/22/2018   Mixed hyperlipidemia 11/22/2018   Morbid obesity ([place]) 11/22/2018   Aneurysm of left popliteal artery ([place]) 11/22/2018   Status post peripheral artery angioplasty with insertion of stent 11/22/2018   Encounter for smoking cessation counseling 11/22/2018   Osteomyelitis of third toe of right foot ([place])    Cellulitis of third toe of right foot     Jamey Reas, PT, DPT 05/19/2021, 9:46 AM  [facility] [address] [place], [place], [address] Phone: [phone]   Fax:  [phone]  Name: JOURNEY RATTERMAN MRN: [id number] Date of Birth: 1961/01/28

## 2021-05-21 ENCOUNTER — Ambulatory Visit (INDEPENDENT_AMBULATORY_CARE_PROVIDER_SITE_OTHER): Payer: BC Managed Care – PPO | Admitting: Physical Therapy

## 2021-05-21 ENCOUNTER — Encounter: Payer: Self-pay | Admitting: Physical Therapy

## 2021-05-21 ENCOUNTER — Other Ambulatory Visit: Payer: Self-pay

## 2021-05-21 DIAGNOSIS — M79662 Pain in left lower leg: Secondary | ICD-10-CM

## 2021-05-21 DIAGNOSIS — R293 Abnormal posture: Secondary | ICD-10-CM

## 2021-05-21 DIAGNOSIS — M6281 Muscle weakness (generalized): Secondary | ICD-10-CM | POA: Diagnosis not present

## 2021-05-21 DIAGNOSIS — R2689 Other abnormalities of gait and mobility: Secondary | ICD-10-CM

## 2021-05-21 DIAGNOSIS — R2681 Unsteadiness on feet: Secondary | ICD-10-CM | POA: Diagnosis not present

## 2021-05-21 DIAGNOSIS — M25662 Stiffness of left knee, not elsewhere classified: Secondary | ICD-10-CM | POA: Diagnosis not present

## 2021-05-21 NOTE — Therapy (Signed)
[facility] Physical Therapy [address] [place], [place], [address] Phone: [phone]   Fax:  [phone]  Physical Therapy Treatment  Patient Details  Name: Anthony Park MRN: [id number] Date of Birth: 1961/06/10 Referring Provider (PT): Harold Barban, MD   Encounter Date: 05/21/2021   PT End of Session - 05/21/21 0806     Visit Number 9    Number of Visits 25    Date for PT Re-Evaluation 07/17/21    Authorization Type [employer] comm ppo    Authorization Time Period DED MET AND OOP MET, 120 Visits PT/OT/ST COMBINED,Remaining 116 Visits    Authorization - Visit Number 9    Authorization - Number of Visits 116    PT Start Time 0800    PT Stop Time 0845    PT Time Calculation (min) 45 min    Equipment Utilized During Treatment Gait belt    Activity Tolerance Patient tolerated treatment well;Patient limited by pain    Behavior During Therapy WFL for tasks assessed/performed             Past Medical History:  Diagnosis Date   Arthritis    Diabetes mellitus without complication ([place])    type 2 controlled with diet    Diabetic toe ulcer ([place]) 11/05/2018   History of kidney stones    passed 1 several years ago   Hyperlipidemia    Hypertension    not on medications   Peripheral vascular disease ([place])    stent in left leg due to anerysm     Past Surgical History:  Procedure Laterality Date    Left Transmetatarsal Ampuation (Left Foot)  08/20/2020   ABDOMINAL AORTOGRAM W/LOWER EXTREMITY N/A 08/06/2020   Procedure: ABDOMINAL AORTOGRAM W/LOWER EXTREMITY;  Surgeon: Marty Heck, MD;  Location: [place] [facility];  Service: Cardiovascular;  Laterality: N/A;   AMPUTATION Right 11/07/2018   Procedure: RIGHT 3RD TOE AMPUTATION;  Surgeon: Newt Minion, MD;  Location: [place];  Service: Orthopedics;  Laterality: Right;   AMPUTATION Left 08/20/2020   Procedure: Left Transmetatarsal Ampuation;  Surgeon: Marty Heck, MD;  Location: [place];  Service:  Vascular;  Laterality: Left;   AMPUTATION Left 10/03/2020   Procedure: LEFT BELOW KNEE AMPUTATION;  Surgeon: Marty Heck, MD;  Location: [place];  Service: Vascular;  Laterality: Left;   AMPUTATION TOE Left    big toe and one next to it   CATARACT EXTRACTION Bilateral    EYE SURGERY Bilateral    cataracts removed   PERIPHERAL VASCULAR BALLOON ANGIOPLASTY Left 08/06/2020   Procedure: PERIPHERAL VASCULAR BALLOON ANGIOPLASTY;  Surgeon: Marty Heck, MD;  Location: [place] [facility];  Service: Cardiovascular;  Laterality: Left;  Peroneal artery.   TOE AMPUTATION Left 2015   TOTAL HIP ARTHROPLASTY Left 05/23/2020   Procedure: LEFT TOTAL HIP ARTHROPLASTY ANTERIOR APPROACH;  Surgeon: Mcarthur Rossetti, MD;  Location: [facility];  Service: Orthopedics;  Laterality: Left;   WOUND DEBRIDEMENT Left 09/11/2020   Procedure: DEBRIDEMENT OF LEFT TRANSMETATARSAL WOUND;  Surgeon: Cherre Robins, MD;  Location: [place];  Service: Vascular;  Laterality: Left;    There were no vitals filed for this visit.   Subjective Assessment - 05/21/21 0800     Subjective His mother is in hospital and he has been there for last 2 days. So he has only worn prosthesis for 2 hours. He used w/c.  He got appt with Dr. Ninfa Linden on 9/21 to check right hip.  Pertinent History Left TTTA, left THA (05/23/20), arthritis, DM2, HTN, HLD, PVD    Patient Stated Goals Wants to use prosthesis to return to some work, get back in community, be mobile, work in gym in his garage    Currently in Pain? Yes    Pain Score 5    last night 8-9/10 from being in w/c   Pain Location Hip    Pain Orientation Right    Pain Descriptors / Indicators Aching;Sore;Sharp;Cramping    Pain Type Chronic pain    Pain Onset More than a month ago    Pain Frequency Constant    Aggravating Factors  being in w/c for last 2 days & arthritis    Pain Relieving Factors moving RLE                               [facility] Adult PT  Treatment/Exercise - 05/21/21 0800       Transfers   Transfers Sit to Stand;Stand to Sit    Sit to Stand 5: Supervision;With upper extremity assist;From chair/3-in-1;Other (comment)   to locked rollator walker   Sit to Stand Details Visual cues/gestures for sequencing;Verbal cues for technique    Stand to Sit 5: Supervision;With upper extremity assist;To chair/3-in-1;Other (comment)   from rollator walker   Stand to Sit Details (indicate cue type and reason) Verbal cues for technique;Visual cues/gestures for sequencing      Ambulation/Gait   Ambulation/Gait Yes    Ambulation/Gait Assistance 5: Supervision;4: Min assist   supervision / cues only & minA cane /HHA   Ambulation Distance (Feet) 150 Feet   150' X 2 enter & exit builiding rollator and 40' cane (15' w/o HHA, then 8' w/HHA)   Assistive device Prosthesis;Rollator    Pre-Gait Activities in //bars with cane only no LUE support, 7' X 3 with supervision.      High Level Balance   High Level Balance Activities Side stepping;Backward walking;Turns   90* turns   High Level Balance Comments 3 laps inside //bars with cane support only, verbal cues on technique      Self-Care   ADL's Pt able to pick up cane from floor with intermittent touch on RW or //bars. Pt able to reach 10" with cane support.      Exercises   Exercises Knee/Hip      Knee/Hip Exercises: Stretches   Active Hamstring Stretch Both;2 reps;30 seconds    Active Hamstring Stretch Limitations supine SLR w/strap      Knee/Hip Exercises: Aerobic   Nustep Seat 12 level 6 with BUEs & BLEs for 8 min      Knee/Hip Exercises: Machines for Strengthening   Total Gym Leg Press Shuttle leg press 125# 20 reps 2 sets 1st set back 45* & 2nd set back flat      Knee/Hip Exercises: Standing   Forward Step Up --    Forward Step Up Limitations --    Step Down --    Step Down Limitations --    Rocker Board --    Rocker Board Limitations --      Knee/Hip Exercises: Seated   Sit to  Sand 1 set;10 reps;without UE support   from 24" bar stool, demo & verbal cues on technique including equal weight bearing     Prosthetics   Prosthetic Care Comments  PT instructed that prosthesis needs to be off overnight at least 6 hours to allow skin to dry.  Current prosthetic wear tolerance (days/week)  daily except last 2 days    Current prosthetic wear tolerance (#hours/day)  10-12 hrs total (4-6 hrs 2x/day).    Current prosthetic weight-bearing tolerance (hours/day)  Patient tolerates standing with partial weight on prosthesis with residual limb pain 4/10.  He reports pain 7-8/10 with wear >15 min at home.    Edema pitting    Residual limb condition  no open areas, good hair growth proximal to incision, red shiny skin distal to incision, normal temperature, cylinderical shape    Education Provided Other (comment);Proper wear schedule/adjustment   see prosthetic care comments   Person(s) Educated Patient    Education Method Explanation;Verbal cues    Education Method Verbalized understanding;Verbal cues required;Needs further instruction    Donning Prosthesis Modified independent (device/increased time)                       PT Short Term Goals - 05/21/21 1245       PT SHORT TERM GOAL #1   Title Patient donnes prosthesis modified independent & verbalizes proper cleaning.    Time 4    Period Weeks    Status Achieved    Target Date 05/21/21      PT SHORT TERM GOAL #2   Title Patient tolerates prosthesis >10 hrs total /day without increase in skin issues or limb pain <5/10 after standing.    Time 4    Period Weeks    Status Achieved    Target Date 05/21/21      PT SHORT TERM GOAL #3   Title Patient able to pick up items from floor & reach 10" with RW support safely.    Time 4    Period Weeks    Status Achieved    Target Date 05/21/21      PT SHORT TERM GOAL #4   Title Patient ambulates 100' with RW & prosthesis with supervision.    Time 4    Period Weeks     Status Achieved    Target Date 05/21/21      PT SHORT TERM GOAL #5   Title Patient negotiates ramps & curbs with RW & prosthesis with minA.    Time 4    Period Weeks    Status Achieved    Target Date 05/21/21              PT Short Term Goals - 05/21/21 0932       PT SHORT TERM GOAL #1   Title Patient verbalizes proper sweat managment with prosthesis.    Time 4    Period Weeks    Status New    Target Date 06/18/21      PT SHORT TERM GOAL #2   Title Patient tolerates prosthesis >80% of awake hrs /day without increase in skin issues or limb pain <4/10 after standing.    Time 4    Period Weeks    Status Revised    Target Date 06/18/21      PT SHORT TERM GOAL #3   Title Patient able to pick up items from floor & reach 10" without UE support safely.    Time 4    Period Weeks    Status Revised    Target Date 06/18/21      PT SHORT TERM GOAL #4   Title Patient ambulates 200' with cane & prosthesis with supervision.    Time 4    Period Weeks    Status  Revised    Target Date 06/18/21      PT SHORT TERM GOAL #5   Title Patient negotiates ramps & curbs with cane & prosthesis with minA.    Time 4    Period Weeks    Status Revised    Target Date 06/18/21               PT Long Term Goals - 05/19/21 0939       PT LONG TERM GOAL #1   Title Patient demonstrates & verbalized understanding of prosthetic care to enable safe utilization of prosthesis.    Time 12    Period Weeks    Status On-going    Target Date 07/17/21      PT LONG TERM GOAL #2   Title Patient tolerates prosthesis wear for >90% of awake hours without skin issues & limb pain </= 2/10.    Time 12    Period Weeks    Status On-going    Target Date 07/17/21      PT LONG TERM GOAL #3   Title Berg Balance >/= 36/56 to indicate lower fall risk    Time 12    Period Weeks    Status On-going    Target Date 07/17/21      PT LONG TERM GOAL #4   Title Patient ambulates 300' with LRAD & prosthesis  modified independent    Time 12    Period Weeks    Status On-going    Target Date 07/17/21      PT LONG TERM GOAL #5   Title Patient negotiates ramps, curbs & stairs with LRAD & prosthesis modified independent.    Time 12    Period Weeks    Status On-going    Target Date 07/17/21                   Plan - 05/21/21 0807     Clinical Impression Statement Patient's hip was more sore today from being in w/c for last 2 days.  Patient's prosthetic gait with cane is improving but still needs light touch with RUE when out of //bars.  Pt met all STGs for first 30 days.    Personal Factors and Comorbidities Comorbidity 3+;Fitness;Time since onset of injury/illness/exacerbation;Past/Current Experience    Comorbidities Left TTTA, left THA (05/23/20), arthritis, DM2, HTN, HLD, PVD    Examination-Activity Limitations Locomotion Level;Squat;Stairs;Stand;Transfers    Examination-Participation Restrictions Community Activity;Occupation    Stability/Clinical Decision Making Evolving/Moderate complexity    Rehab Potential Good    PT Frequency 2x / week    PT Duration 12 weeks    PT Treatment/Interventions ADLs/Self Care Home Management;DME Instruction;Gait training;Stair training;Functional mobility training;Therapeutic activities;Therapeutic exercise;Balance training;Neuromuscular re-education;Patient/family education;Prosthetic Training;Vestibular    PT Next Visit Plan work towards new STGs, prosthetic gait with cane,  Balance activities, therapeutic exercise    PT Home Exercise Plan Access Code: [email or web address]  + sink HEP    Consulted and Agree with Plan of Care Patient             Patient will benefit from skilled therapeutic intervention in order to improve the following deficits and impairments:  Abnormal gait, Decreased activity tolerance, Decreased balance, Decreased endurance, Decreased knowledge of use of DME, Decreased mobility, Decreased range of motion, Decreased skin integrity,  Decreased strength, Increased edema, Postural dysfunction, Prosthetic Dependency, Pain  Visit Diagnosis: Other abnormalities of gait and mobility  Unsteadiness on feet  Stiffness of left knee, not elsewhere classified  Muscle weakness (  generalized)  Abnormal posture  Pain in left lower leg     Problem List Patient Active Problem List   Diagnosis Date Noted   S/P BKA (below knee amputation) unilateral, left ([place]) 10/03/2020   Non-healing wound of lower extremity 10/03/2020   History of amputation of left foot through metatarsal bone ([place]) 09/11/2020   PAD (peripheral artery disease) ([place]) 08/20/2020   Status post total replacement of left hip 06/05/2020   Unilateral primary osteoarthritis, right hip 04/17/2020   Unilateral primary osteoarthritis, left hip 04/17/2020   Amputated toe, right ([place]) 11/22/2018   Type 2 diabetes mellitus with foot ulcer, without long-term current use of insulin ([place]) 11/22/2018   Essential hypertension 11/22/2018   Mixed hyperlipidemia 11/22/2018   Morbid obesity ([place]) 11/22/2018   Aneurysm of left popliteal artery ([place]) 11/22/2018   Status post peripheral artery angioplasty with insertion of stent 11/22/2018   Encounter for smoking cessation counseling 11/22/2018   Osteomyelitis of third toe of right foot ([place])    Cellulitis of third toe of right foot     Jamey Reas, PT, DPT 05/21/2021, 9:27 AM  [facility] [address] [place], [place], [address] Phone: [phone]   Fax:  [phone]  Name: Anthony Park MRN: [id number] Date of Birth: 1961-09-09

## 2021-05-25 DIAGNOSIS — I700 Atherosclerosis of aorta: Secondary | ICD-10-CM | POA: Diagnosis not present

## 2021-05-25 DIAGNOSIS — N3289 Other specified disorders of bladder: Secondary | ICD-10-CM | POA: Diagnosis not present

## 2021-05-25 DIAGNOSIS — I714 Abdominal aortic aneurysm, without rupture: Secondary | ICD-10-CM | POA: Diagnosis not present

## 2021-05-25 DIAGNOSIS — N1339 Other hydronephrosis: Secondary | ICD-10-CM | POA: Diagnosis not present

## 2021-05-25 DIAGNOSIS — N132 Hydronephrosis with renal and ureteral calculous obstruction: Secondary | ICD-10-CM | POA: Diagnosis not present

## 2021-05-26 ENCOUNTER — Encounter: Payer: Self-pay | Admitting: Rehabilitation

## 2021-05-26 ENCOUNTER — Ambulatory Visit (INDEPENDENT_AMBULATORY_CARE_PROVIDER_SITE_OTHER): Payer: BC Managed Care – PPO | Admitting: Rehabilitation

## 2021-05-26 ENCOUNTER — Other Ambulatory Visit: Payer: Self-pay

## 2021-05-26 DIAGNOSIS — M25662 Stiffness of left knee, not elsewhere classified: Secondary | ICD-10-CM | POA: Diagnosis not present

## 2021-05-26 DIAGNOSIS — R293 Abnormal posture: Secondary | ICD-10-CM

## 2021-05-26 DIAGNOSIS — R2681 Unsteadiness on feet: Secondary | ICD-10-CM

## 2021-05-26 DIAGNOSIS — R2689 Other abnormalities of gait and mobility: Secondary | ICD-10-CM

## 2021-05-26 DIAGNOSIS — M6281 Muscle weakness (generalized): Secondary | ICD-10-CM | POA: Diagnosis not present

## 2021-05-26 NOTE — Therapy (Signed)
[facility] Physical Therapy [address] [place], [place], [address] Phone: [phone]   Fax:  [phone]  Physical Therapy Treatment  Patient Details  Name: Anthony Park MRN: [id number] Date of Birth: 1960/09/29 Referring Provider (PT): Harold Barban, MD   Encounter Date: 05/26/2021   PT End of Session - 05/26/21 0944     Visit Number 10    Number of Visits 25    Date for PT Re-Evaluation 07/17/21    Authorization Type [employer] comm ppo    Authorization Time Period DED MET AND OOP MET, 120 Visits PT/OT/ST COMBINED,Remaining 116 Visits    Authorization - Visit Number 10    Authorization - Number of Visits 116    PT Start Time 0845    PT Stop Time 0931    PT Time Calculation (min) 46 min    Equipment Utilized During Treatment Gait belt    Activity Tolerance Patient tolerated treatment well;Patient limited by pain    Behavior During Therapy WFL for tasks assessed/performed             Past Medical History:  Diagnosis Date   Arthritis    Diabetes mellitus without complication ([place])    type 2 controlled with diet    Diabetic toe ulcer ([place]) 11/05/2018   History of kidney stones    passed 1 several years ago   Hyperlipidemia    Hypertension    not on medications   Peripheral vascular disease ([place])    stent in left leg due to anerysm     Past Surgical History:  Procedure Laterality Date    Left Transmetatarsal Ampuation (Left Foot)  08/20/2020   ABDOMINAL AORTOGRAM W/LOWER EXTREMITY N/A 08/06/2020   Procedure: ABDOMINAL AORTOGRAM W/LOWER EXTREMITY;  Surgeon: Marty Heck, MD;  Location: [facility];  Service: Cardiovascular;  Laterality: N/A;   AMPUTATION Right 11/07/2018   Procedure: RIGHT 3RD TOE AMPUTATION;  Surgeon: Newt Minion, MD;  Location: [place];  Service: Orthopedics;  Laterality: Right;   AMPUTATION Left 08/20/2020   Procedure: Left Transmetatarsal Ampuation;  Surgeon: Marty Heck, MD;  Location: [place];   Service: Vascular;  Laterality: Left;   AMPUTATION Left 10/03/2020   Procedure: LEFT BELOW KNEE AMPUTATION;  Surgeon: Marty Heck, MD;  Location: [facility];  Service: Vascular;  Laterality: Left;   AMPUTATION TOE Left    big toe and one next to it   CATARACT EXTRACTION Bilateral    EYE SURGERY Bilateral    cataracts removed   PERIPHERAL VASCULAR BALLOON ANGIOPLASTY Left 08/06/2020   Procedure: PERIPHERAL VASCULAR BALLOON ANGIOPLASTY;  Surgeon: Marty Heck, MD;  Location: [facility];  Service: Cardiovascular;  Laterality: Left;  Peroneal artery.   TOE AMPUTATION Left 2015   TOTAL HIP ARTHROPLASTY Left 05/23/2020   Procedure: LEFT TOTAL HIP ARTHROPLASTY ANTERIOR APPROACH;  Surgeon: Mcarthur Rossetti, MD;  Location: [facility];  Service: Orthopedics;  Laterality: Left;   WOUND DEBRIDEMENT Left 09/11/2020   Procedure: DEBRIDEMENT OF LEFT TRANSMETATARSAL WOUND;  Surgeon: Cherre Robins, MD;  Location: [facility];  Service: Vascular;  Laterality: Left;    There were no vitals filed for this visit.   Subjective Assessment - 05/26/21 0849     Subjective Pt reports leg is stinging today.  Needs to add another sock.  Is wearing 11 ply after adding socks.  His mother did pass away and therefore he has been sitting more in w/c sorting through her things.  Pertinent History Left TTTA, left THA (05/23/20), arthritis, DM2, HTN, HLD, PVD    Patient Stated Goals Wants to use prosthesis to return to some work, get back in community, be mobile, work in gym in his garage    Currently in Pain? Yes    Pain Score 6     Pain Location Leg    Pain Orientation Left    Pain Descriptors / Indicators Aching;Sore    Pain Type Chronic pain    Pain Onset More than a month ago    Pain Frequency Intermittent    Aggravating Factors  being in w/c more often    Pain Relieving Factors moving                               [facility] Adult PT Treatment/Exercise - 05/26/21 0858        Transfers   Transfers Sit to Stand;Stand to Sit    Sit to Stand 5: Supervision;With upper extremity assist;From chair/3-in-1;Other (comment)    Sit to Stand Details Visual cues/gestures for sequencing;Verbal cues for technique    Stand to Sit 5: Supervision;With upper extremity assist;To chair/3-in-1;Other (comment)    Stand to Sit Details (indicate cue type and reason) Verbal cues for technique;Visual cues/gestures for sequencing      Ambulation/Gait   Ambulation/Gait Yes    Ambulation/Gait Assistance 5: Supervision;4: Min assist    Ambulation/Gait Assistance Details S into and out of clinic with rollator walker with min cues for posture and step length.  Attempted short distance with cane to nustep, however due to increased R hip pain, had to discontinue and resume with rollator.  He did ambulate approx 20' with cane with L HHA from PT.    Ambulation Distance (Feet) 150 Feet   x 2 to enter and exit, 20' x 1 (cane), 50'x 1   Assistive device Prosthesis;Rollator;Straight cane    Gait Pattern Step-to pattern;Decreased step length - right;Decreased stance time - left;Decreased stride length;Decreased hip/knee flexion - left;Decreased weight shift to right;Left flexed knee in stance;Antalgic;Lateral hip instability;Abducted - left;Trunk flexed    Ambulation Surface Level;Indoor      High Level Balance   High Level Balance Activities Side stepping    High Level Balance Comments Perfomed side steps over small orange cone with light UE support and cues for posture throughout x 10 reps.      Neuro Re-ed    Neuro Re-ed Details  High level balance in // bars:  on foam airex-feet apart EO head turns side/side and up/down x 10 reps each progressing to feet together EO with head turns x 10 rep each direction.  This was somewhat difficult therefore transitioned to solid ground with feet together EC x 30 secs, progressing to [facility] with head turns each direction (without stop in the middle) x 10 reps each  direction.  Pt did very well with this so got back on airex with feet slightly apart with EC x 30 secs, adding head turns with better success, verbally added for home (he reports he will incorporate as he has a foam pad he works on at home).  On solid ground tapping cones, alternating LEs x 10 reps with cues for improved hip/glute activation during stance.  Standing on balance beam (foam) performing forward stepping x 10 and backwards stepping x 10 reps.  All exericses with light UE support.  Pt unable to fully remove UE support due to pain in R  hip and L resdiual limb.  PT provided pt with another 1 ply sock during session due to continued bottoming out of limb in prosthesis.      Knee/Hip Exercises: Machines for Strengthening   Total Gym Leg Press Shuttle leg press 100' (lighter weight today due to pain) with back at 45* x 15 reps.  Pt attempted to do single leg, however again due to pain had to discontinue.      Prosthetics   Prosthetic Care Comments  Continue to encourage increased wear time during the day as able.    Current prosthetic wear tolerance (days/week)  daily, still improving since mother passed away and has been sorting through her things from w/c level    Current prosthetic wear tolerance (#hours/day)  10-12 hrs total (4-6 hrs 2x/day).    Current prosthetic weight-bearing tolerance (hours/day)  Patient tolerates standing with partial weight on prosthesis with residual limb pain 4/10.  He reports pain 7-8/10 with wear >15 min at home.    Residual limb condition  no open areas, good hair growth proximal to incision, red shiny skin distal to incision, normal temperature, cylinderical shape    Education Provided Proper wear schedule/adjustment    Person(s) Educated Patient    Education Method Explanation;Verbal cues    Education Method Needs further instruction    Donning Prosthesis Modified independent (device/increased time)                       PT Short Term Goals -  05/21/21 0932       PT SHORT TERM GOAL #1   Title Patient verbalizes proper sweat managment with prosthesis.    Time 4    Period Weeks    Status New    Target Date 06/18/21      PT SHORT TERM GOAL #2   Title Patient tolerates prosthesis >80% of awake hrs /day without increase in skin issues or limb pain <4/10 after standing.    Time 4    Period Weeks    Status Revised    Target Date 06/18/21      PT SHORT TERM GOAL #3   Title Patient able to pick up items from floor & reach 10" without UE support safely.    Time 4    Period Weeks    Status Revised    Target Date 06/18/21      PT SHORT TERM GOAL #4   Title Patient ambulates 200' with cane & prosthesis with supervision.    Time 4    Period Weeks    Status Revised    Target Date 06/18/21      PT SHORT TERM GOAL #5   Title Patient negotiates ramps & curbs with cane & prosthesis with minA.    Time 4    Period Weeks    Status Revised    Target Date 06/18/21               PT Long Term Goals - 05/19/21 0939       PT LONG TERM GOAL #1   Title Patient demonstrates & verbalized understanding of prosthetic care to enable safe utilization of prosthesis.    Time 12    Period Weeks    Status On-going    Target Date 07/17/21      PT LONG TERM GOAL #2   Title Patient tolerates prosthesis wear for >90% of awake hours without skin issues & limb pain </= 2/10.    Time  12    Period Weeks    Status On-going    Target Date 07/17/21      PT LONG TERM GOAL #3   Title Berg Balance >/= 36/56 to indicate lower fall risk    Time 12    Period Weeks    Status On-going    Target Date 07/17/21      PT LONG TERM GOAL #4   Title Patient ambulates 300' with LRAD & prosthesis modified independent    Time 12    Period Weeks    Status On-going    Target Date 07/17/21      PT LONG TERM GOAL #5   Title Patient negotiates ramps, curbs & stairs with LRAD & prosthesis modified independent.    Time 12    Period Weeks    Status  On-going    Target Date 07/17/21                   Plan - 05/26/21 0944     Clinical Impression Statement Pt reports decline in mobility and balance due to more sedentary over last several days due to mother passing away.  He also continues to be very limited due to R hip pain and continued muscle pain/fatigue in residual limb.  He does report he has an appt with [facility] at end of month, however discussed that there may be limited options due to continued fluctuations in fluid.    Personal Factors and Comorbidities Comorbidity 3+;Fitness;Time since onset of injury/illness/exacerbation;Past/Current Experience    Comorbidities Left TTTA, left THA (05/23/20), arthritis, DM2, HTN, HLD, PVD    Examination-Activity Limitations Locomotion Level;Squat;Stairs;Stand;Transfers    Examination-Participation Restrictions Community Activity;Occupation    Stability/Clinical Decision Making Evolving/Moderate complexity    Rehab Potential Good    PT Frequency 2x / week    PT Duration 12 weeks    PT Treatment/Interventions ADLs/Self Care Home Management;DME Instruction;Gait training;Stair training;Functional mobility training;Therapeutic activities;Therapeutic exercise;Balance training;Neuromuscular re-education;Patient/family education;Prosthetic Training;Vestibular    PT Next Visit Plan work towards new STGs, prosthetic gait with cane,  Balance activities, therapeutic exercise    PT Home Exercise Plan Access Code: [email or web address]  + sink HEP    Consulted and Agree with Plan of Care Patient             Patient will benefit from skilled therapeutic intervention in order to improve the following deficits and impairments:  Abnormal gait, Decreased activity tolerance, Decreased balance, Decreased endurance, Decreased knowledge of use of DME, Decreased mobility, Decreased range of motion, Decreased skin integrity, Decreased strength, Increased edema, Postural dysfunction, Prosthetic Dependency, Pain  Visit  Diagnosis: Other abnormalities of gait and mobility  Unsteadiness on feet  Stiffness of left knee, not elsewhere classified  Muscle weakness (generalized)  Abnormal posture     Problem List Patient Active Problem List   Diagnosis Date Noted   S/P BKA (below knee amputation) unilateral, left ([place]) 10/03/2020   Non-healing wound of lower extremity 10/03/2020   History of amputation of left foot through metatarsal bone ([place]) 09/11/2020   PAD (peripheral artery disease) ([place]) 08/20/2020   Status post total replacement of left hip 06/05/2020   Unilateral primary osteoarthritis, right hip 04/17/2020   Unilateral primary osteoarthritis, left hip 04/17/2020   Amputated toe, right ([place]) 11/22/2018   Type 2 diabetes mellitus with foot ulcer, without long-term current use of insulin ([place]) 11/22/2018   Essential hypertension 11/22/2018   Mixed hyperlipidemia 11/22/2018   Morbid obesity ([place]) 11/22/2018   Aneurysm of left  popliteal artery ([place]) 11/22/2018   Status post peripheral artery angioplasty with insertion of stent 11/22/2018   Encounter for smoking cessation counseling 11/22/2018   Osteomyelitis of third toe of right foot ([place])    Cellulitis of third toe of right foot    Cameron Sprang, PT, MPT [facility] [address] [place], [place], [address] Phone: [phone]   Fax:  [phone] 05/26/21, 9:48 AM    Name: Anthony Park MRN: [id number] Date of Birth: 08-Nov-1960

## 2021-05-28 ENCOUNTER — Encounter: Payer: Self-pay | Admitting: Rehabilitation

## 2021-05-28 ENCOUNTER — Ambulatory Visit (INDEPENDENT_AMBULATORY_CARE_PROVIDER_SITE_OTHER): Payer: BC Managed Care – PPO | Admitting: Rehabilitation

## 2021-05-28 ENCOUNTER — Other Ambulatory Visit: Payer: Self-pay

## 2021-05-28 DIAGNOSIS — R2681 Unsteadiness on feet: Secondary | ICD-10-CM | POA: Diagnosis not present

## 2021-05-28 DIAGNOSIS — R2689 Other abnormalities of gait and mobility: Secondary | ICD-10-CM | POA: Diagnosis not present

## 2021-05-28 DIAGNOSIS — M25662 Stiffness of left knee, not elsewhere classified: Secondary | ICD-10-CM | POA: Diagnosis not present

## 2021-05-28 DIAGNOSIS — R293 Abnormal posture: Secondary | ICD-10-CM | POA: Diagnosis not present

## 2021-05-28 NOTE — Therapy (Signed)
[facility] [address] [place], [place], [address] Phone: [phone]   Fax:  [phone]  Physical Therapy Treatment  Patient Details  Name: Anthony Park MRN: [id number] Date of Birth: Jun 26, 1961 Referring Provider (PT): Harold Barban, MD   Encounter Date: 05/28/2021   PT End of Session - 05/28/21 1245     Visit Number 11    Number of Visits 25    Date for PT Re-Evaluation 07/17/21    Authorization Type BCBS comm ppo    Authorization Time Period DED MET AND OOP MET, 120 Visits PT/OT/ST COMBINED,Remaining 116 Visits    Authorization - Visit Number 10    Authorization - Number of Visits 116    PT Start Time 0845    PT Stop Time 0928    PT Time Calculation (min) 43 min    Equipment Utilized During Treatment Gait belt    Activity Tolerance Patient tolerated treatment well;Patient limited by pain    Behavior During Therapy WFL for tasks assessed/performed             Past Medical History:  Diagnosis Date   Arthritis    Diabetes mellitus without complication ([place])    type 2 controlled with diet    Diabetic toe ulcer ([place]) 11/05/2018   History of kidney stones    passed 1 several years ago   Hyperlipidemia    Hypertension    not on medications   Peripheral vascular disease ([place])    stent in left leg due to anerysm     Past Surgical History:  Procedure Laterality Date    Left Transmetatarsal Ampuation (Left Foot)  08/20/2020   ABDOMINAL AORTOGRAM W/LOWER EXTREMITY N/A 08/06/2020   Procedure: ABDOMINAL AORTOGRAM W/LOWER EXTREMITY;  Surgeon: Marty Heck, MD;  Location: [place] [facility];  Service: Cardiovascular;  Laterality: N/A;   AMPUTATION Right 11/07/2018   Procedure: RIGHT 3RD TOE AMPUTATION;  Surgeon: Newt Minion, MD;  Location: [place];  Service: Orthopedics;  Laterality: Right;   AMPUTATION Left 08/20/2020   Procedure: Left Transmetatarsal Ampuation;  Surgeon: Marty Heck, MD;  Location: [place];   Service: Vascular;  Laterality: Left;   AMPUTATION Left 10/03/2020   Procedure: LEFT BELOW KNEE AMPUTATION;  Surgeon: Marty Heck, MD;  Location: [place];  Service: Vascular;  Laterality: Left;   AMPUTATION TOE Left    big toe and one next to it   CATARACT EXTRACTION Bilateral    EYE SURGERY Bilateral    cataracts removed   PERIPHERAL VASCULAR BALLOON ANGIOPLASTY Left 08/06/2020   Procedure: PERIPHERAL VASCULAR BALLOON ANGIOPLASTY;  Surgeon: Marty Heck, MD;  Location: [place] [facility];  Service: Cardiovascular;  Laterality: Left;  Peroneal artery.   TOE AMPUTATION Left 2015   TOTAL HIP ARTHROPLASTY Left 05/23/2020   Procedure: LEFT TOTAL HIP ARTHROPLASTY ANTERIOR APPROACH;  Surgeon: Mcarthur Rossetti, MD;  [facility];  Service: Orthopedics;  Laterality: Left;   WOUND DEBRIDEMENT Left 09/11/2020   Procedure: DEBRIDEMENT OF LEFT TRANSMETATARSAL WOUND;  Surgeon: Cherre Robins, MD;  Location: [place];  Service: Vascular;  Laterality: Left;    There were no vitals filed for this visit.   Subjective Assessment - 05/28/21 0847     Subjective Pt doing better today, hip pain better.  Wearing 10 ply to start with today.    Pertinent History Left TTTA, left THA (05/23/20), arthritis, DM2, HTN, HLD, PVD    Currently in Pain? Yes    Pain Score  3     Pain Location Leg    Pain Orientation Left    Pain Descriptors / Indicators Aching;Sore    Pain Type Chronic pain    Pain Onset More than a month ago    Pain Frequency Intermittent                               [facility] Adult PT Treatment/Exercise - 05/28/21 0850       Transfers   Transfers Sit to Stand;Stand to Sit    Sit to Stand 5: Supervision;With upper extremity assist;From chair/3-in-1;Other (comment)    Sit to Stand Details Visual cues/gestures for sequencing;Verbal cues for technique    Stand to Sit 5: Supervision;With upper extremity assist;To chair/3-in-1;Other (comment)    Stand to Sit  Details (indicate cue type and reason) Verbal cues for technique;Visual cues/gestures for sequencing      Ambulation/Gait   Ambulation/Gait Yes    Ambulation/Gait Assistance 5: Supervision;4: Min assist    Ambulation/Gait Assistance Details Ambulated in/out of clinic with rollator walker at S level with min cues for posture once in clinic. Continue to trial use of quad tip cane today with cane in L hand x 50' then in R hand x 25' to determine if more comfortable due to pain in R hip.  Pt still demos more antalgic therefore discouraged use of cane at this time at home due to poor quality of gait and possibility of loss of balance and increased pain.  Pt verbalized understanding.    Ambulation Distance (Feet) 150 Feet   x 2 to enter/exit and then 50'x 1 and 25' x 1 with quad tip cane   Assistive device Prosthesis;Rollator;Straight cane   cane with quad tip   Gait Pattern Step-to pattern;Decreased step length - right;Decreased stance time - left;Decreased stride length;Decreased hip/knee flexion - left;Decreased weight shift to right;Left flexed knee in stance;Antalgic;Lateral hip instability;Abducted - left;Trunk flexed    Ambulation Surface Level;Indoor      Knee/Hip Exercises: Diplomatic Services operational officer Both;2 reps;30 seconds    Active Hamstring Stretch Limitations supine SLR w/strap (on shuttle press)    Hip Flexor Stretch Both;1 rep   3 mins   Hip Flexor Stretch Limitations prone hip flex stretch    Piriformis Stretch Right;1 rep;30 seconds    Piriformis Stretch Limitations with use of tennis ball in seated position.      Knee/Hip Exercises: Aerobic   Nustep Seat 12 level 7 with BUEs & BLEs for 8 min      Knee/Hip Exercises: Machines for Strengthening   Total Gym Leg Press Shuttle leg press 125# (lighter weight today due to pain) with back at 45* x 15 reps. Then performed hamstring stretch before completing another set of 15 reps ( able to do half with an extension, alt march then  return to knee flex) but due to pain in residual limb unable to do full set.                       PT Short Term Goals - 05/21/21 0932       PT SHORT TERM GOAL #1   Title Patient verbalizes proper sweat managment with prosthesis.    Time 4    Period Weeks    Status New    Target Date 06/18/21      PT SHORT TERM GOAL #2   Title Patient tolerates prosthesis >80%  of awake hrs /day without increase in skin issues or limb pain <4/10 after standing.    Time 4    Period Weeks    Status Revised    Target Date 06/18/21      PT SHORT TERM GOAL #3   Title Patient able to pick up items from floor & reach 10" without UE support safely.    Time 4    Period Weeks    Status Revised    Target Date 06/18/21      PT SHORT TERM GOAL #4   Title Patient ambulates 200' with cane & prosthesis with supervision.    Time 4    Period Weeks    Status Revised    Target Date 06/18/21      PT SHORT TERM GOAL #5   Title Patient negotiates ramps & curbs with cane & prosthesis with minA.    Time 4    Period Weeks    Status Revised    Target Date 06/18/21               PT Long Term Goals - 05/19/21 0939       PT LONG TERM GOAL #1   Title Patient demonstrates & verbalized understanding of prosthetic care to enable safe utilization of prosthesis.    Time 12    Period Weeks    Status On-going    Target Date 07/17/21      PT LONG TERM GOAL #2   Title Patient tolerates prosthesis wear for >90% of awake hours without skin issues & limb pain </= 2/10.    Time 12    Period Weeks    Status On-going    Target Date 07/17/21      PT LONG TERM GOAL #3   Title Berg Balance >/= 36/56 to indicate lower fall risk    Time 12    Period Weeks    Status On-going    Target Date 07/17/21      PT LONG TERM GOAL #4   Title Patient ambulates 300' with LRAD & prosthesis modified independent    Time 12    Period Weeks    Status On-going    Target Date 07/17/21      PT LONG TERM GOAL #5    Title Patient negotiates ramps, curbs & stairs with LRAD & prosthesis modified independent.    Time 12    Period Weeks    Status On-going    Target Date 07/17/21                   Plan - 05/28/21 1246     Clinical Impression Statement Skilled session focused on BLE strengthening, ROM and continued gait with cane.  Did use quad tip cane today for more support.  Also had him trial in R vs L UE to see which felt more comfortable.  He did ambulate better with cane in R hand however still demos antalgic gait causing imbalance.    Personal Factors and Comorbidities Comorbidity 3+;Fitness;Time since onset of injury/illness/exacerbation;Past/Current Experience    Comorbidities Left TTTA, left THA (05/23/20), arthritis, DM2, HTN, HLD, PVD    Examination-Activity Limitations Locomotion Level;Squat;Stairs;Stand;Transfers    Examination-Participation Restrictions Community Activity;Occupation    Stability/Clinical Decision Making Evolving/Moderate complexity    Rehab Potential Good    PT Frequency 2x / week    PT Duration 12 weeks    PT Treatment/Interventions ADLs/Self Care Home Management;DME Instruction;Gait training;Stair training;Functional mobility training;Therapeutic activities;Therapeutic exercise;Balance training;Neuromuscular re-education;Patient/family  education;Prosthetic Training;Vestibular    PT Next Visit Plan work towards new STGs, prosthetic gait with cane,  Balance activities, therapeutic exercise    PT Home Exercise Plan Access Code: [email or web address]  + sink HEP    Consulted and Agree with Plan of Care Patient             Patient will benefit from skilled therapeutic intervention in order to improve the following deficits and impairments:  Abnormal gait, Decreased activity tolerance, Decreased balance, Decreased endurance, Decreased knowledge of use of DME, Decreased mobility, Decreased range of motion, Decreased skin integrity, Decreased strength, Increased edema, Postural  dysfunction, Prosthetic Dependency, Pain  Visit Diagnosis: Other abnormalities of gait and mobility  Unsteadiness on feet  Stiffness of left knee, not elsewhere classified  Abnormal posture     Problem List Patient Active Problem List   Diagnosis Date Noted   S/P BKA (below knee amputation) unilateral, left ([place]) 10/03/2020   Non-healing wound of lower extremity 10/03/2020   History of amputation of left foot through metatarsal bone ([place]) 09/11/2020   PAD (peripheral artery disease) ([place]) 08/20/2020   Status post total replacement of left hip 06/05/2020   Unilateral primary osteoarthritis, right hip 04/17/2020   Unilateral primary osteoarthritis, left hip 04/17/2020   Amputated toe, right ([place]) 11/22/2018   Type 2 diabetes mellitus with foot ulcer, without long-term current use of insulin ([place]) 11/22/2018   Essential hypertension 11/22/2018   Mixed hyperlipidemia 11/22/2018   Morbid obesity ([place]) 11/22/2018   Aneurysm of left popliteal artery ([place]) 11/22/2018   Status post peripheral artery angioplasty with insertion of stent 11/22/2018   Encounter for smoking cessation counseling 11/22/2018   Osteomyelitis of third toe of right foot ([place])    Cellulitis of third toe of right foot    Cameron Sprang, PT, MPT [facility] [address] [place], [place], [address] Phone: [phone]   Fax:  [phone] 05/28/21, 12:49 PM   [facility] [address] [place], [place], [address] Phone: [phone]   Fax:  [phone]  Name: DENNIS HEGEMAN MRN: [id number] Date of Birth: 21-Mar-1961

## 2021-05-29 DIAGNOSIS — N200 Calculus of kidney: Secondary | ICD-10-CM | POA: Diagnosis not present

## 2021-06-02 ENCOUNTER — Ambulatory Visit (INDEPENDENT_AMBULATORY_CARE_PROVIDER_SITE_OTHER): Payer: BC Managed Care – PPO | Admitting: Physical Therapy

## 2021-06-02 ENCOUNTER — Encounter: Payer: Self-pay | Admitting: Physical Therapy

## 2021-06-02 ENCOUNTER — Other Ambulatory Visit: Payer: Self-pay

## 2021-06-02 DIAGNOSIS — R2689 Other abnormalities of gait and mobility: Secondary | ICD-10-CM

## 2021-06-02 DIAGNOSIS — M25662 Stiffness of left knee, not elsewhere classified: Secondary | ICD-10-CM | POA: Diagnosis not present

## 2021-06-02 DIAGNOSIS — R293 Abnormal posture: Secondary | ICD-10-CM

## 2021-06-02 DIAGNOSIS — M79662 Pain in left lower leg: Secondary | ICD-10-CM

## 2021-06-02 DIAGNOSIS — M6281 Muscle weakness (generalized): Secondary | ICD-10-CM

## 2021-06-02 DIAGNOSIS — R2681 Unsteadiness on feet: Secondary | ICD-10-CM | POA: Diagnosis not present

## 2021-06-02 NOTE — Therapy (Signed)
[facility] [address] [place], [place], [address] Phone: [phone]   Fax:  [phone]  Physical Therapy Treatment  Patient Details  Name: Anthony Park MRN: [id number] Date of Birth: [birth year] Referring Provider (PT): Harold Barban, MD   Encounter Date: 06/02/2021   PT End of Session - 06/02/21 0813     Visit Number 12    Number of Visits 25    Date for PT Re-Evaluation 07/17/21    Authorization Type BCBS comm ppo    Authorization Time Period DED MET AND OOP MET, 120 Visits PT/OT/ST COMBINED,Remaining 116 Visits    Authorization - Visit Number 10    Authorization - Number of Visits 116    PT Start Time 0802    PT Stop Time 0845    PT Time Calculation (min) 43 min    Equipment Utilized During Treatment Gait belt    Activity Tolerance Patient tolerated treatment well;Patient limited by pain    Behavior During Therapy WFL for tasks assessed/performed             Past Medical History:  Diagnosis Date   Arthritis    Diabetes mellitus without complication ([place])    type 2 controlled with diet    Diabetic toe ulcer ([place]) 11/05/2018   History of kidney stones    passed 1 several years ago   Hyperlipidemia    Hypertension    not on medications   Peripheral vascular disease ([place])    stent in left leg due to anerysm     Past Surgical History:  Procedure Laterality Date    Left Transmetatarsal Ampuation (Left Foot)  08/20/2020   ABDOMINAL AORTOGRAM W/LOWER EXTREMITY N/A 08/06/2020   Procedure: ABDOMINAL AORTOGRAM W/LOWER EXTREMITY;  Surgeon: Marty Heck, MD;  Location: [place] [facility];  Service: Cardiovascular;  Laterality: N/A;   AMPUTATION Right 11/07/2018   Procedure: RIGHT 3RD TOE AMPUTATION;  Surgeon: Newt Minion, MD;  Location: [place];  Service: Orthopedics;  Laterality: Right;   AMPUTATION Left 08/20/2020   Procedure: Left Transmetatarsal Ampuation;  Surgeon: Marty Heck, MD;  Location: [place];   Service: Vascular;  Laterality: Left;   AMPUTATION Left 10/03/2020   Procedure: LEFT BELOW KNEE AMPUTATION;  Surgeon: Marty Heck, MD;  Location: [place];  Service: Vascular;  Laterality: Left;   AMPUTATION TOE Left    big toe and one next to it   CATARACT EXTRACTION Bilateral    EYE SURGERY Bilateral    cataracts removed   PERIPHERAL VASCULAR BALLOON ANGIOPLASTY Left 08/06/2020   Procedure: PERIPHERAL VASCULAR BALLOON ANGIOPLASTY;  Surgeon: Marty Heck, MD;  Location: [place] [facility];  Service: Cardiovascular;  Laterality: Left;  Peroneal artery.   TOE AMPUTATION Left 2015   TOTAL HIP ARTHROPLASTY Left 05/23/2020   Procedure: LEFT TOTAL HIP ARTHROPLASTY ANTERIOR APPROACH;  Surgeon: Mcarthur Rossetti, MD;  [facility];  Service: Orthopedics;  Laterality: Left;   WOUND DEBRIDEMENT Left 09/11/2020   Procedure: DEBRIDEMENT OF LEFT TRANSMETATARSAL WOUND;  Surgeon: Cherre Robins, MD;  Location: [place];  Service: Vascular;  Laterality: Left;    There were no vitals filed for this visit.   Subjective Assessment - 06/02/21 0806     Subjective He has been sitting on tennis ball that helps hip.  He is wearing from 1hr after arising, midday prosthesis off ~19min 2-3 times liner on & removes for day 1-2 hours prior to bedtime.    Pertinent History Left TTTA,  left THA (05/23/20), arthritis, DM2, HTN, HLD, PVD    Patient Stated Goals Wants to use prosthesis to return to some work, get back in community, be mobile, work in gym in his garage    Currently in Pain? Yes    Pain Score 3     Pain Location Hip    Pain Orientation Right    Pain Descriptors / Indicators Aching;Nagging    Pain Type Chronic pain    Pain Onset More than a month ago    Pain Frequency Intermittent    Aggravating Factors  staying in one position too long    Pain Relieving Factors tennis ball                               [facility] Adult PT Treatment/Exercise - 06/02/21 0802        Transfers   Transfers Sit to Stand;Stand to Sit    Sit to Stand 5: Supervision;With upper extremity assist;From chair/3-in-1;Other (comment)    Sit to Stand Details Visual cues/gestures for sequencing;Verbal cues for technique    Stand to Sit 5: Supervision;With upper extremity assist;To chair/3-in-1;Other (comment)    Stand to Sit Details (indicate cue type and reason) Verbal cues for technique;Visual cues/gestures for sequencing      Ambulation/Gait   Ambulation/Gait Yes    Ambulation/Gait Assistance 5: Supervision;4: Min assist   MinA cane & sup rollator walker   Ambulation/Gait Assistance Details tactile & verbal cues on upright posture & balance reactions    Ambulation Distance (Feet) 150 Feet   rollator x 2 to enter/exit and then 50'x 2 with cane stand alone tip   Assistive device Prosthesis;Rollator;Straight cane   cane with stand alone tip   Gait Pattern --    Ambulation Surface Level;Indoor    Ramp 3: Mod assist   cane & HHA with TTA prosthesis   Ramp Details (indicate cue type and reason) verbal, tactile & demo cues on technique    Curb 3: Mod assist   cane & HHA with TTA prosthesis   Curb Details (indicate cue type and reason) verbal, tactile & demo cues on technique      Knee/Hip Exercises: Stretches   Active Hamstring Stretch Both;2 reps;30 seconds    Active Hamstring Stretch Limitations supine SLR w/strap (on shuttle press)    Hip Flexor Stretch Both;1 rep   3 mins   Hip Flexor Stretch Limitations supine hip flex stretch PROM    Piriformis Stretch Right;1 rep;30 seconds    Piriformis Stretch Limitations supine with figue 4 PROM      Knee/Hip Exercises: Aerobic   Nustep Seat 12 level 7 with BUEs & BLEs for 9 min      Knee/Hip Exercises: Machines for Strengthening   Total Gym Leg Press Shuttle leg press 131# (lighter weight today due to pain) 15 reps 2 sets. 1st set back 45* & 2nd set back flat      Manual Therapy   Manual therapy comments tennis balls under right  hip for piriformis mobilization                       PT Short Term Goals - 06/02/21 1253       PT SHORT TERM GOAL #1   Title Patient verbalizes proper sweat managment with prosthesis.    Time 4    Period Weeks    Status On-going    Target Date 06/18/21  PT SHORT TERM GOAL #2   Title Patient tolerates prosthesis >80% of awake hrs /day without increase in skin issues or limb pain <4/10 after standing.    Time 4    Period Weeks    Status On-going    Target Date 06/18/21      PT SHORT TERM GOAL #3   Title Patient able to pick up items from floor & reach 10" without UE support safely.    Time 4    Period Weeks    Status On-going    Target Date 06/18/21      PT SHORT TERM GOAL #4   Title Patient ambulates 200' with cane & prosthesis with supervision.    Time 4    Period Weeks    Status On-going    Target Date 06/18/21      PT SHORT TERM GOAL #5   Title Patient negotiates ramps & curbs with cane & prosthesis with minA.    Time 4    Period Weeks    Status On-going    Target Date 06/18/21               PT Long Term Goals - 05/19/21 0939       PT LONG TERM GOAL #1   Title Patient demonstrates & verbalized understanding of prosthetic care to enable safe utilization of prosthesis.    Time 12    Period Weeks    Status On-going    Target Date 07/17/21      PT LONG TERM GOAL #2   Title Patient tolerates prosthesis wear for >90% of awake hours without skin issues & limb pain </= 2/10.    Time 12    Period Weeks    Status On-going    Target Date 07/17/21      PT LONG TERM GOAL #3   Title Berg Balance >/= 36/56 to indicate lower fall risk    Time 12    Period Weeks    Status On-going    Target Date 07/17/21      PT LONG TERM GOAL #4   Title Patient ambulates 300' with LRAD & prosthesis modified independent    Time 12    Period Weeks    Status On-going    Target Date 07/17/21      PT LONG TERM GOAL #5   Title Patient negotiates ramps,  curbs & stairs with LRAD & prosthesis modified independent.    Time 12    Period Weeks    Status On-going    Target Date 07/17/21                   Plan - 06/02/21 0813     Clinical Impression Statement Patient is limited by right hip arthritic pain. He may benefit from dry needling to Piriformis muscle. He has appt with Dr. Ninfa Linden to assess hip & set plan.  Patient required less UE support for level surface gait. PT initiated negotiating ramps & curbs with cane but required HHA with 2nd UE today.    Personal Factors and Comorbidities Comorbidity 3+;Fitness;Time since onset of injury/illness/exacerbation;Past/Current Experience    Comorbidities Left TTTA, left THA (05/23/20), arthritis, DM2, HTN, HLD, PVD    Examination-Activity Limitations Locomotion Level;Squat;Stairs;Stand;Transfers    Examination-Participation Restrictions Community Activity;Occupation    Stability/Clinical Decision Making Evolving/Moderate complexity    Rehab Potential Good    PT Frequency 2x / week    PT Duration 12 weeks    PT Treatment/Interventions ADLs/Self Care  Home Management;DME Instruction;Gait training;Stair training;Functional mobility training;Therapeutic activities;Therapeutic exercise;Balance training;Neuromuscular re-education;Patient/family education;Prosthetic Training;Vestibular;Dry needling;Manual techniques    PT Next Visit Plan work towards new STGs, prosthetic gait with cane,  Balance activities, therapeutic exercise, dry needling    PT Home Exercise Plan Access Code: [email or web address]  + sink HEP    Consulted and Agree with Plan of Care Patient             Patient will benefit from skilled therapeutic intervention in order to improve the following deficits and impairments:  Abnormal gait, Decreased activity tolerance, Decreased balance, Decreased endurance, Decreased knowledge of use of DME, Decreased mobility, Decreased range of motion, Decreased skin integrity, Decreased strength,  Increased edema, Postural dysfunction, Prosthetic Dependency, Pain  Visit Diagnosis: Unsteadiness on feet - Plan: PT plan of care cert/re-cert  Stiffness of left knee, not elsewhere classified - Plan: PT plan of care cert/re-cert  Other abnormalities of gait and mobility - Plan: PT plan of care cert/re-cert  Abnormal posture - Plan: PT plan of care cert/re-cert  Muscle weakness (generalized) - Plan: PT plan of care cert/re-cert  Pain in left lower leg - Plan: PT plan of care cert/re-cert     Problem List Patient Active Problem List   Diagnosis Date Noted   S/P BKA (below knee amputation) unilateral, left ([place]) 10/03/2020   Non-healing wound of lower extremity 10/03/2020   History of amputation of left foot through metatarsal bone ([place]) 09/11/2020   PAD (peripheral artery disease) ([place]) 08/20/2020   Status post total replacement of left hip 06/05/2020   Unilateral primary osteoarthritis, right hip 04/17/2020   Unilateral primary osteoarthritis, left hip 04/17/2020   Amputated toe, right ([place]) 11/22/2018   Type 2 diabetes mellitus with foot ulcer, without long-term current use of insulin ([place]) 11/22/2018   Essential hypertension 11/22/2018   Mixed hyperlipidemia 11/22/2018   Morbid obesity ([place]) 11/22/2018   Aneurysm of left popliteal artery ([place]) 11/22/2018   Status post peripheral artery angioplasty with insertion of stent 11/22/2018   Encounter for smoking cessation counseling 11/22/2018   Osteomyelitis of third toe of right foot ([place])    Cellulitis of third toe of right foot     Jamey Reas, PT, DPT 06/02/2021, 12:59 PM  [facility] [address] [place], [place], [address] Phone: [phone]   Fax:  [phone]  Name: Anthony Park MRN: [id number] Date of Birth: 1961-01-20

## 2021-06-03 ENCOUNTER — Ambulatory Visit (INDEPENDENT_AMBULATORY_CARE_PROVIDER_SITE_OTHER): Payer: BC Managed Care – PPO | Admitting: Physical Therapy

## 2021-06-03 ENCOUNTER — Ambulatory Visit: Payer: Self-pay

## 2021-06-03 ENCOUNTER — Encounter: Payer: Self-pay | Admitting: Physical Therapy

## 2021-06-03 ENCOUNTER — Ambulatory Visit (INDEPENDENT_AMBULATORY_CARE_PROVIDER_SITE_OTHER): Payer: BC Managed Care – PPO | Admitting: Orthopaedic Surgery

## 2021-06-03 DIAGNOSIS — M25662 Stiffness of left knee, not elsewhere classified: Secondary | ICD-10-CM

## 2021-06-03 DIAGNOSIS — M25551 Pain in right hip: Secondary | ICD-10-CM

## 2021-06-03 DIAGNOSIS — R2681 Unsteadiness on feet: Secondary | ICD-10-CM

## 2021-06-03 DIAGNOSIS — R2689 Other abnormalities of gait and mobility: Secondary | ICD-10-CM

## 2021-06-03 DIAGNOSIS — R293 Abnormal posture: Secondary | ICD-10-CM

## 2021-06-03 DIAGNOSIS — M6281 Muscle weakness (generalized): Secondary | ICD-10-CM

## 2021-06-03 DIAGNOSIS — M79662 Pain in left lower leg: Secondary | ICD-10-CM

## 2021-06-03 NOTE — Therapy (Addendum)
[facility] [address] [place], [place], [address] Phone: [phone]   Fax:  [phone]  Physical Therapy Treatment  Patient Details  Name: Anthony Park MRN: [id number] Date of Birth: [birth year] Referring Provider (PT): Harold Barban, MD   Encounter Date: 06/03/2021   PT End of Session - 06/03/21 0802     Visit Number 13    Number of Visits 25    Date for PT Re-Evaluation 07/17/21    Authorization Type [employer] comm ppo    Authorization Time Period DED MET AND OOP MET, 120 Visits PT/OT/ST COMBINED,Remaining 116 Visits    Authorization - Visit Number 13    Authorization - Number of Visits 116    PT Start Time 0800    PT Stop Time 0841    PT Time Calculation (min) 41 min    Equipment Utilized During Treatment Gait belt    Activity Tolerance Patient tolerated treatment well;Patient limited by pain    Behavior During Therapy WFL for tasks assessed/performed             Past Medical History:  Diagnosis Date   Arthritis    Diabetes mellitus without complication ([place])    type 2 controlled with diet    Diabetic toe ulcer ([place]) 11/05/2018   History of kidney stones    passed 1 several years ago   Hyperlipidemia    Hypertension    not on medications   Peripheral vascular disease ([place])    stent in left leg due to anerysm     Past Surgical History:  Procedure Laterality Date    Left Transmetatarsal Ampuation (Left Foot)  08/20/2020   ABDOMINAL AORTOGRAM W/LOWER EXTREMITY N/A 08/06/2020   Procedure: ABDOMINAL AORTOGRAM W/LOWER EXTREMITY;  Surgeon: Marty Heck, MD;  Location: [facility];  Service: Cardiovascular;  Laterality: N/A;   AMPUTATION Right 11/07/2018   Procedure: RIGHT 3RD TOE AMPUTATION;  Surgeon: Newt Minion, MD;  Location: [place];  Service: Orthopedics;  Laterality: Right;   AMPUTATION Left 08/20/2020   Procedure: Left Transmetatarsal Ampuation;  Surgeon: Marty Heck, MD;  Location: [place];   Service: Vascular;  Laterality: Left;   AMPUTATION Left 10/03/2020   Procedure: LEFT BELOW KNEE AMPUTATION;  Surgeon: Marty Heck, MD;  Location: [place];  Service: Vascular;  Laterality: Left;   AMPUTATION TOE Left    big toe and one next to it   CATARACT EXTRACTION Bilateral    EYE SURGERY Bilateral    cataracts removed   PERIPHERAL VASCULAR BALLOON ANGIOPLASTY Left 08/06/2020   Procedure: PERIPHERAL VASCULAR BALLOON ANGIOPLASTY;  Surgeon: Marty Heck, MD;  Location: [facility];  Service: Cardiovascular;  Laterality: Left;  Peroneal artery.   TOE AMPUTATION Left 2015   TOTAL HIP ARTHROPLASTY Left 05/23/2020   Procedure: LEFT TOTAL HIP ARTHROPLASTY ANTERIOR APPROACH;  Surgeon: Mcarthur Rossetti, MD;  Location: [facility];  Service: Orthopedics;  Laterality: Left;   WOUND DEBRIDEMENT Left 09/11/2020   Procedure: DEBRIDEMENT OF LEFT TRANSMETATARSAL WOUND;  Surgeon: Cherre Robins, MD;  Location: [place];  Service: Vascular;  Laterality: Left;    There were no vitals filed for this visit.   Subjective Assessment - 06/03/21 0800     Subjective He worked on ramp with Left knee bending during stance not prior at initial contact with rollator at home.    Pertinent History Left TTTA, left THA (05/23/20), arthritis, DM2, HTN, HLD, PVD    Patient Stated Goals Wants to  use prosthesis to return to some work, get back in community, be mobile, work in gym in his garage    Currently in Pain? Yes    Pain Score 1     Pain Location Hip    Pain Orientation Right    Pain Descriptors / Indicators Aching    Pain Type Chronic pain    Pain Onset More than a month ago    Pain Frequency Intermittent    Aggravating Factors  standing & walking too much    Pain Relieving Factors tennis ball                               [facility] Adult PT Treatment/Exercise - 06/03/21 0800       Transfers   Transfers Sit to Stand;Stand to Sit    Sit to Stand 5: Supervision;With  upper extremity assist;From chair/3-in-1;Other (comment)    Sit to Stand Details Visual cues/gestures for sequencing;Verbal cues for technique    Stand to Sit 5: Supervision;With upper extremity assist;To chair/3-in-1;Other (comment)    Stand to Sit Details (indicate cue type and reason) Verbal cues for technique;Visual cues/gestures for sequencing      Ambulation/Gait   Ambulation/Gait Yes    Ambulation/Gait Assistance 5: Supervision;4: Min assist   MinA cane & sup rollator walker   Ambulation Distance (Feet) 150 Feet   rollator x 2 to enter/exit and then 80'x 2 with cane stand alone tip   Assistive device Prosthesis;Rollator;Straight cane;1 person hand held assist   cane with stand alone tip & HHA   Ramp --    Curb --      Knee/Hip Exercises: Stretches   Active Hamstring Stretch --    Active Hamstring Stretch Limitations --    Hip Flexor Stretch Both;3 reps;30 seconds    Hip Flexor Stretch Limitations supine Thomas position hip flex stretch PROM    Piriformis Stretch Right;30 seconds;2 reps    Piriformis Stretch Limitations seated with figue 4      Knee/Hip Exercises: Aerobic   Nustep Seat 12 level 7 with BUEs & BLEs for 10 min      Manual Therapy   Manual Therapy Soft tissue mobilization;Muscle Energy Technique    Manual therapy comments --    Soft tissue mobilization soft tissue mobilizations with compression to right glut & piriformis.    Muscle Energy Technique contract relax for right hip flexor stretch      Prosthetics   Prosthetic Care Comments  PT explained sweating with amputation & prosthesis and recommended only removing long enough to pat limb/liner dry.    Current prosthetic wear tolerance (days/week)  daily    Current prosthetic wear tolerance (#hours/day)  most awake hours, removes prosthesis for massaging after standing, removes liner & prosthesis for 15-30 min 2-4 times per day,    Current prosthetic weight-bearing tolerance (hours/day)  Patient tolerates standing  with weight on prosthesis with residual limb pain 2-3/10.  He reports pain 7-8/10 with wear >45-60 min at home.    Residual limb condition  edema at distal tibia with pain on palpation,  wound on incision line at distal tibia with granulation, good hair growth, normal moisture & temperature    Education Provided Proper weight-bearing schedule/adjustment;Skin check;Residual limb care;Other (comment)   see prosthetic care comments   Person(s) Educated Patient    Education Method Explanation;Verbal cues    Education Method Verbalized understanding;Verbal cues required    Donning Prosthesis Modified  independent (device/increased time)              Trigger Point Dry Needling - 06/03/21 0800     Consent Given? Yes    Education Handout Provided Yes    Muscles Treated Back/Hip Piriformis    Piriformis Response Twitch response elicited                   PT Education - 06/03/21 0826     Education Details Access [email or web address]  trigger point dry needling    Person(s) Educated Patient    Methods Explanation;Verbal cues;Handout    Comprehension Verbalized understanding              PT Short Term Goals - 06/02/21 1253       PT SHORT TERM GOAL #1   Title Patient verbalizes proper sweat managment with prosthesis.    Time 4    Period Weeks    Status On-going    Target Date 06/18/21      PT SHORT TERM GOAL #2   Title Patient tolerates prosthesis >80% of awake hrs /day without increase in skin issues or limb pain <4/10 after standing.    Time 4    Period Weeks    Status On-going    Target Date 06/18/21      PT SHORT TERM GOAL #3   Title Patient able to pick up items from floor & reach 10" without UE support safely.    Time 4    Period Weeks    Status On-going    Target Date 06/18/21      PT SHORT TERM GOAL #4   Title Patient ambulates 200' with cane & prosthesis with supervision.    Time 4    Period Weeks    Status On-going    Target Date 06/18/21      PT SHORT TERM  GOAL #5   Title Patient negotiates ramps & curbs with cane & prosthesis with minA.    Time 4    Period Weeks    Status On-going    Target Date 06/18/21               PT Long Term Goals - 05/19/21 0939       PT LONG TERM GOAL #1   Title Patient demonstrates & verbalized understanding of prosthetic care to enable safe utilization of prosthesis.    Time 12    Period Weeks    Status On-going    Target Date 07/17/21      PT LONG TERM GOAL #2   Title Patient tolerates prosthesis wear for >90% of awake hours without skin issues & limb pain </= 2/10.    Time 12    Period Weeks    Status On-going    Target Date 07/17/21      PT LONG TERM GOAL #3   Title Berg Balance >/= 36/56 to indicate lower fall risk    Time 12    Period Weeks    Status On-going    Target Date 07/17/21      PT LONG TERM GOAL #4   Title Patient ambulates 300' with LRAD & prosthesis modified independent    Time 12    Period Weeks    Status On-going    Target Date 07/17/21      PT LONG TERM GOAL #5   Title Patient negotiates ramps, curbs & stairs with LRAD & prosthesis modified independent.    Time 12    Period  Weeks    Status On-going    Target Date 07/17/21                   Plan - 06/03/21 0803     Clinical Impression Statement Trigger point dry needling to right Piriformis with active muscle spasm noted. Pt reported less discomfort when standing & ambulating after procedure.  Patient is seeing Dr. Ninfa Linden after PT today to assess right hip pain.  Patient was limited in weight bearing with prosthesis due to overdoing it yesterday with localized edema at distal tibia.    Personal Factors and Comorbidities Comorbidity 3+;Fitness;Time since onset of injury/illness/exacerbation;Past/Current Experience    Comorbidities Left TTTA, left THA (05/23/20), arthritis, DM2, HTN, HLD, PVD    Examination-Activity Limitations Locomotion Level;Squat;Stairs;Stand;Transfers    Examination-Participation  Restrictions Community Activity;Occupation    Stability/Clinical Decision Making Evolving/Moderate complexity    Rehab Potential Good    PT Frequency 2x / week    PT Duration 12 weeks    PT Treatment/Interventions ADLs/Self Care Home Management;DME Instruction;Gait training;Stair training;Functional mobility training;Therapeutic activities;Therapeutic exercise;Balance training;Neuromuscular re-education;Patient/family education;Prosthetic Training;Vestibular;Dry needling;Manual techniques    PT Next Visit Plan check plan for right hip, work towards new STGs, prosthetic gait with cane,  Balance activities, therapeutic exercise, dry needling prn    PT Home Exercise Plan Access Code: [email or web address]  + sink HEP    Consulted and Agree with Plan of Care Patient             Patient will benefit from skilled therapeutic intervention in order to improve the following deficits and impairments:  Abnormal gait, Decreased activity tolerance, Decreased balance, Decreased endurance, Decreased knowledge of use of DME, Decreased mobility, Decreased range of motion, Decreased skin integrity, Decreased strength, Increased edema, Postural dysfunction, Prosthetic Dependency, Pain  Visit Diagnosis: Unsteadiness on feet  Stiffness of left knee, not elsewhere classified  Other abnormalities of gait and mobility  Abnormal posture  Muscle weakness (generalized)  Pain in left lower leg     Problem List Patient Active Problem List   Diagnosis Date Noted   S/P BKA (below knee amputation) unilateral, left ([place]) 10/03/2020   Non-healing wound of lower extremity 10/03/2020   History of amputation of left foot through metatarsal bone ([place]) 09/11/2020   PAD (peripheral artery disease) ([place]) 08/20/2020   Status post total replacement of left hip 06/05/2020   Unilateral primary osteoarthritis, right hip 04/17/2020   Unilateral primary osteoarthritis, left hip 04/17/2020   Amputated toe, right ([place]) 11/22/2018    Type 2 diabetes mellitus with foot ulcer, without long-term current use of insulin ([place]) 11/22/2018   Essential hypertension 11/22/2018   Mixed hyperlipidemia 11/22/2018   Morbid obesity ([place]) 11/22/2018   Aneurysm of left popliteal artery ([place]) 11/22/2018   Status post peripheral artery angioplasty with insertion of stent 11/22/2018   Encounter for smoking cessation counseling 11/22/2018   Osteomyelitis of third toe of right foot ([place])    Cellulitis of third toe of right foot     Jamey Reas, PT, DPT 06/03/2021, 8:51 AM  Laureen Abrahams, PT, DPT 06/29/21 12:00 PM   [facility] [place], [place], [address] Phone: [phone]   Fax:  [phone]  Name: Anthony Park MRN: [id number] Date of Birth: 10-14-1960

## 2021-06-03 NOTE — Progress Notes (Signed)
Office Visit Note   Patient: Anthony Park           Date of Birth: [birth year]           MRN: [id number] Visit Date: 06/03/2021              Requested by: Melida Quitter, MD [address] [place],  [place] [address] PCP: Melida Quitter, MD   Assessment & Plan: Visit Diagnoses:  1. Pain in right hip     Plan: Given the significant worsening of his right hip x-rays combined with his worsening clinical exam with severe end-stage arthritis of the right hip, I am recommending a total hip arthroplasty for the right side.  I actually feel this will help with his posture and his mobility and help with his recovery from having a prosthetic leg on his left below-knee amputation site.  He agrees with this as well.  We discussed in detail the risks and benefits of surgery.  Having had a left total hip replacement he is fully aware of the postoperative course and what to expect intraoperatively as well.  We will work on getting the surgery scheduled.  Follow-Up Instructions: Return for 2 weeks post-op.   Orders:  Orders Placed This Encounter  Procedures   XR HIP UNILAT W OR W/O PELVIS 1V RIGHT   No orders of the defined types were placed in this encounter.     Procedures: No procedures performed   Clinical Data: No additional findings.   Subjective: Chief Complaint  Patient presents with   Right Hip - Pain  The patient is very well-known to me.  We actually replaced his left hip in September 2021 secondary to severe end-stage arthritis.  At that time he did have significant arthritis in his right hip.  That is now become significantly worse and it is debilitating at this point in terms of his daily pain, this is definitely affecting his mobility, his quality of life and his actives daily living.  Since I have seen him, in February of this year he had a below-knee amputation secondary to peripheral vascular disease.  He has a prosthesis now that he has had for 7 weeks and is working with  Zella Ball upstairs and physical therapy with his mobility and his balance and coordination.  His hip pain on the right side is gotten significantly worse and again debilitating for him.  HPI  Review of Systems There is currently no headache, chest pain, shortness of breath, fever, chills, nausea, vomiting  Objective: Vital Signs: There were no vitals taken for this visit.  Physical Exam He is alert and orient x3 and in no acute distress Ortho Exam Examination of his right hip shows essentially significant limitations in rotation and severe stiffness.  There is severe pain with internal and external rotation when I try to manipulate the hip. Specialty Comments:  No specialty comments available.  Imaging: XR HIP UNILAT W OR W/O PELVIS 1V RIGHT  Result Date: 06/03/2021 An AP pelvis and lateral of the right hip shows severe end-stage arthritis of the right hip.  The joint space is completely gone at this standpoint the femoral head is flattened.  There is significant periarticular osteophytes and sclerotic changes around the hip ball and acetabulum.  This is significantly worsened from x-rays a year ago.    PMFS History: Patient Active Problem List   Diagnosis Date Noted   S/P BKA (below knee amputation) unilateral, left ([facility]) 10/03/2020   Non-healing wound  of lower extremity 10/03/2020   History of amputation of left foot through metatarsal bone ([facility]) 09/11/2020   PAD (peripheral artery disease) ([facility]) 08/20/2020   Status post total replacement of left hip 06/05/2020   Unilateral primary osteoarthritis, right hip 04/17/2020   Unilateral primary osteoarthritis, left hip 04/17/2020   Amputated toe, right ([facility]) 11/22/2018   Type 2 diabetes mellitus with foot ulcer, without long-term current use of insulin ([facility]) 11/22/2018   Essential hypertension 11/22/2018   Mixed hyperlipidemia 11/22/2018   Morbid obesity ([facility]) 11/22/2018   Aneurysm of left popliteal artery ([facility]) 11/22/2018   Status  post peripheral artery angioplasty with insertion of stent 11/22/2018   Encounter for smoking cessation counseling 11/22/2018   Osteomyelitis of third toe of right foot ([facility])    Cellulitis of third toe of right foot    Past Medical History:  Diagnosis Date   Arthritis    Diabetes mellitus without complication ([facility])    type 2 controlled with diet    Diabetic toe ulcer ([facility]) 11/05/2018   History of kidney stones    passed 1 several years ago   Hyperlipidemia    Hypertension    not on medications   Peripheral vascular disease ([facility])    stent in left leg due to anerysm     Family History  Problem Relation Age of Onset   COPD Mother    Cataracts Mother    Brain cancer Father    Hypertension Brother     Past Surgical History:  Procedure Laterality Date    Left Transmetatarsal Ampuation (Left Foot)  08/20/2020   ABDOMINAL AORTOGRAM W/LOWER EXTREMITY N/A 08/06/2020   Procedure: ABDOMINAL AORTOGRAM W/LOWER EXTREMITY;  Surgeon: [name], MD;  [facility];  Service: Cardiovascular;  Laterality: N/A;   AMPUTATION Right 11/07/2018   Procedure: RIGHT 3RD TOE AMPUTATION;  Surgeon: Nadara Mustard, MD;  Location: [facility];  Service: Orthopedics;  Laterality: Right;   AMPUTATION Left 08/20/2020   Procedure: Left Transmetatarsal Ampuation;  Surgeon: [name], MD;  [facility];  Service: Vascular;  Laterality: Left;   AMPUTATION Left 10/03/2020   Procedure: LEFT BELOW KNEE AMPUTATION;  Surgeon: [name], MD;  Location: [facility];  Service: Vascular;  Laterality: Left;   AMPUTATION TOE Left    big toe and one next to it   CATARACT EXTRACTION Bilateral    EYE SURGERY Bilateral    cataracts removed   PERIPHERAL VASCULAR BALLOON ANGIOPLASTY Left 08/06/2020   Procedure: PERIPHERAL VASCULAR BALLOON ANGIOPLASTY;  Surgeon: [name], MD;  [facility];  Service: Cardiovascular;  Laterality: Left;  Peroneal artery.   TOE  AMPUTATION Left 2015   TOTAL HIP ARTHROPLASTY Left 05/23/2020   Procedure: LEFT TOTAL HIP ARTHROPLASTY ANTERIOR APPROACH;  Surgeon: Kathryne Hitch, MD;  Location[facility];  Service: Orthopedics;  Laterality: Left;   WOUND DEBRIDEMENT Left 09/11/2020   Procedure: DEBRIDEMENT OF LEFT TRANSMETATARSAL WOUND;  Surgeon: Leonie Douglas, MD;  Location: [facility];  Service: Vascular;  Laterality: Left;   Social History   Occupational History   Not on file  Tobacco Use   Smoking status: Former    Packs/day: 0.50    Years: 41.00    Pack years: 20.50    Types: Cigarettes    Quit date: 07/20/2020    Years since quitting: 0.8   Smokeless tobacco: Never  Vaping Use   Vaping Use: Never used  Substance and Sexual Activity  Alcohol use: Yes    Alcohol/week: 14.0 standard drinks    Types: 14 Standard drinks or equivalent per week   Drug use: Not Currently    Comment: hx of marijuana 15 years ago    Sexual activity: Yes

## 2021-06-04 ENCOUNTER — Encounter: Payer: BC Managed Care – PPO | Admitting: Physical Therapy

## 2021-06-05 ENCOUNTER — Other Ambulatory Visit: Payer: Self-pay | Admitting: Vascular Surgery

## 2021-06-09 ENCOUNTER — Other Ambulatory Visit: Payer: Self-pay

## 2021-06-09 ENCOUNTER — Encounter: Payer: Self-pay | Admitting: Physical Therapy

## 2021-06-09 ENCOUNTER — Encounter: Payer: BC Managed Care – PPO | Admitting: Physical Therapy

## 2021-06-09 ENCOUNTER — Ambulatory Visit (INDEPENDENT_AMBULATORY_CARE_PROVIDER_SITE_OTHER): Payer: BC Managed Care – PPO | Admitting: Physical Therapy

## 2021-06-09 DIAGNOSIS — M6281 Muscle weakness (generalized): Secondary | ICD-10-CM

## 2021-06-09 DIAGNOSIS — R2681 Unsteadiness on feet: Secondary | ICD-10-CM

## 2021-06-09 DIAGNOSIS — M25662 Stiffness of left knee, not elsewhere classified: Secondary | ICD-10-CM

## 2021-06-09 DIAGNOSIS — M79662 Pain in left lower leg: Secondary | ICD-10-CM

## 2021-06-09 DIAGNOSIS — R2689 Other abnormalities of gait and mobility: Secondary | ICD-10-CM | POA: Diagnosis not present

## 2021-06-09 DIAGNOSIS — R293 Abnormal posture: Secondary | ICD-10-CM

## 2021-06-09 NOTE — Therapy (Addendum)
[facility] Physical Therapy [address] [place], [place], [address] Phone: [phone]   Fax:  [phone]  Physical Therapy Treatment  Patient Details  Name: Anthony Park MRN: [id number] Date of Birth: 1961-01-21 Referring Provider (PT): Harold Barban, MD   Encounter Date: 06/09/2021   PT End of Session - 06/09/21 1517     Visit Number 14    Number of Visits 25    Date for PT Re-Evaluation 07/17/21    Authorization Type [employer] comm ppo    Authorization Time Period DED MET AND OOP MET, 120 Visits PT/OT/ST COMBINED,Remaining 116 Visits    Authorization - Visit Number 14    Authorization - Number of Visits 116    PT Start Time 1510    PT Stop Time 1555    PT Time Calculation (min) 45 min    Equipment Utilized During Treatment Gait belt    Activity Tolerance Patient tolerated treatment well;Patient limited by pain    Behavior During Therapy WFL for tasks assessed/performed             Past Medical History:  Diagnosis Date   Arthritis    Diabetes mellitus without complication ([place])    type 2 controlled with diet    Diabetic toe ulcer ([place]) 11/05/2018   History of kidney stones    passed 1 several years ago   Hyperlipidemia    Hypertension    not on medications   Peripheral vascular disease ([place])    stent in left leg due to anerysm     Past Surgical History:  Procedure Laterality Date    Left Transmetatarsal Ampuation (Left Foot)  08/20/2020   ABDOMINAL AORTOGRAM W/LOWER EXTREMITY N/A 08/06/2020   Procedure: ABDOMINAL AORTOGRAM W/LOWER EXTREMITY;  Surgeon: Marty Heck, MD;  Location: [facility];  Service: Cardiovascular;  Laterality: N/A;   AMPUTATION Right 11/07/2018   Procedure: RIGHT 3RD TOE AMPUTATION;  Surgeon: Newt Minion, MD;  Location: [facility];  Service: Orthopedics;  Laterality: Right;   AMPUTATION Left 08/20/2020   Procedure: Left Transmetatarsal Ampuation;  Surgeon: Marty Heck, MD;  Location: [facility];   Service: Vascular;  Laterality: Left;   AMPUTATION Left 10/03/2020   Procedure: LEFT BELOW KNEE AMPUTATION;  Surgeon: Marty Heck, MD;  Location: [facility];  Service: Vascular;  Laterality: Left;   AMPUTATION TOE Left    big toe and one next to it   CATARACT EXTRACTION Bilateral    EYE SURGERY Bilateral    cataracts removed   PERIPHERAL VASCULAR BALLOON ANGIOPLASTY Left 08/06/2020   Procedure: PERIPHERAL VASCULAR BALLOON ANGIOPLASTY;  Surgeon: Marty Heck, MD;  Location: [facility];  Service: Cardiovascular;  Laterality: Left;  Peroneal artery.   TOE AMPUTATION Left 2015   TOTAL HIP ARTHROPLASTY Left 05/23/2020   Procedure: LEFT TOTAL HIP ARTHROPLASTY ANTERIOR APPROACH;  Surgeon: Mcarthur Rossetti, MD;  Location: [facility];  Service: Orthopedics;  Laterality: Left;   WOUND DEBRIDEMENT Left 09/11/2020   Procedure: DEBRIDEMENT OF LEFT TRANSMETATARSAL WOUND;  Surgeon: Cherre Robins, MD;  Location: [facility];  Service: Vascular;  Laterality: Left;    There were no vitals filed for this visit.   Subjective Assessment - 06/09/21 1510     Subjective He saw Dr. Ninfa Linden who recommended right hip replacement.  His tibial crest is still burning after 20-30 min of standing. He has to remove prosthesis to rub limb to relieve.  He is using bar stool for modified standing.  The  dry needling did help with muscles.    Pertinent History Left TTTA, left THA (05/23/20), arthritis, DM2, HTN, HLD, PVD    Patient Stated Goals Wants to use prosthesis to return to some work, get back in community, be mobile, work in gym in his garage    Currently in Pain? Yes    Pain Score 3     Pain Location Hip    Pain Orientation Right    Pain Descriptors / Indicators Aching;Sore    Pain Type Chronic pain    Pain Onset More than a month ago    Pain Frequency Constant    Aggravating Factors  standing & walking    Pain Relieving Factors sitting on tennis ball                                [facility] Adult PT Treatment/Exercise - 06/09/21 1510       Transfers   Transfers Sit to Stand;Stand to Sit    Sit to Stand 5: Supervision;With upper extremity assist;From chair/3-in-1;Other (comment)    Sit to Stand Details Visual cues/gestures for sequencing;Verbal cues for technique    Stand to Sit 5: Supervision;With upper extremity assist;To chair/3-in-1;Other (comment)    Stand to Sit Details (indicate cue type and reason) Verbal cues for technique;Visual cues/gestures for sequencing      Ambulation/Gait   Ambulation/Gait Yes    Ambulation/Gait Assistance 5: Supervision;4: Min assist   MinA cane & sup rollator walker   Ambulation Distance (Feet) 150 Feet   rollator x 2 to enter/exit and then 80'x 2 with cane stand alone tip   Assistive device Prosthesis;Rollator;Straight cane;1 person hand held assist   cane with stand alone tip & HHA     Knee/Hip Exercises: Stretches   Hip Flexor Stretch --    Hip Flexor Stretch Limitations --    Piriformis Stretch --    Piriformis Stretch Limitations --      Knee/Hip Exercises: Aerobic   Nustep Seat 12 level 7 with BUEs & BLEs for 7 min      Knee/Hip Exercises: Standing   Rocker Board 1 minute   2 sets ea direction, round board w/single pivot point, BUE support   Rocker Board Limitations ant/mid/post and right/mid/left,  demo & verbal cues on technique. 1st set with mirror for visual feedback & 2nd set without mirror to facilitate proprioception at socket - limb interface.      Manual Therapy   Manual Therapy Soft tissue mobilization;Muscle Energy Technique    Soft tissue mobilization soft tissue mobilizations with compression to right glut & piriformis.    Muscle Energy Technique contract relax for right hip flexor stretch      Prosthetics   Prosthetic Care Comments  PT explained sweating with amputation & prosthesis and recommended only removing long enough to pat limb/liner dry.    Current prosthetic  wear tolerance (days/week)  daily    Current prosthetic wear tolerance (#hours/day)  most awake hours, removes prosthesis for massaging after standing, removes liner & prosthesis for 15-30 min 2-4 times per day,    Current prosthetic weight-bearing tolerance (hours/day)  Patient tolerates standing with weight on prosthesis with residual limb pain 2-3/10.  He reports pain 7-8/10 with wear >45-60 min at home.    Residual limb condition  edema at distal tibia with pain on palpation,  wound on incision line at distal tibia with granulation, good hair growth, normal moisture &  temperature    Education Provided Proper weight-bearing schedule/adjustment;Skin check;Residual limb care;Other (comment)   see prosthetic care comments             Trigger Point Dry Needling - 06/09/21 1510     Consent Given? Yes    Education Handout Provided Yes    Muscles Treated Back/Hip Piriformis    Piriformis Response Twitch response elicited           DN performed by Elsie Ra, PT, DPT      Balance Exercises - 06/09/21 1510       Balance Exercises: Standing   Standing Eyes Opened Wide (BOA);Foam/compliant surface;Head turns;5 reps   4 directions, crossways on foam beam   Standing Eyes Opened Limitations visual, tactile & verbal cues on balance reactions    Standing Eyes Closed Wide (BOA);Solid surface;Head turns;5 reps   4 directions   Standing Eyes Closed Limitations tactile & verbal cues on balance reactions                  PT Short Term Goals - 06/02/21 1253       PT SHORT TERM GOAL #1   Title Patient verbalizes proper sweat managment with prosthesis.    Time 4    Period Weeks    Status On-going    Target Date 06/18/21      PT SHORT TERM GOAL #2   Title Patient tolerates prosthesis >80% of awake hrs /day without increase in skin issues or limb pain <4/10 after standing.    Time 4    Period Weeks    Status On-going    Target Date 06/18/21      PT SHORT TERM GOAL #3    Title Patient able to pick up items from floor & reach 10" without UE support safely.    Time 4    Period Weeks    Status On-going    Target Date 06/18/21      PT SHORT TERM GOAL #4   Title Patient ambulates 200' with cane & prosthesis with supervision.    Time 4    Period Weeks    Status On-going    Target Date 06/18/21      PT SHORT TERM GOAL #5   Title Patient negotiates ramps & curbs with cane & prosthesis with minA.    Time 4    Period Weeks    Status On-going    Target Date 06/18/21               PT Long Term Goals - 05/19/21 0939       PT LONG TERM GOAL #1   Title Patient demonstrates & verbalized understanding of prosthetic care to enable safe utilization of prosthesis.    Time 12    Period Weeks    Status On-going    Target Date 07/17/21      PT LONG TERM GOAL #2   Title Patient tolerates prosthesis wear for >90% of awake hours without skin issues & limb pain </= 2/10.    Time 12    Period Weeks    Status On-going    Target Date 07/17/21      PT LONG TERM GOAL #3   Title Berg Balance >/= 36/56 to indicate lower fall risk    Time 12    Period Weeks    Status On-going    Target Date 07/17/21      PT LONG TERM GOAL #4   Title Patient ambulates 300'  with LRAD & prosthesis modified independent    Time 12    Period Weeks    Status On-going    Target Date 07/17/21      PT LONG TERM GOAL #5   Title Patient negotiates ramps, curbs & stairs with LRAD & prosthesis modified independent.    Time 12    Period Weeks    Status On-going    Target Date 07/17/21                   Plan - 06/09/21 1517     Clinical Impression Statement Patient is slowly improving with prosthetic gait with cane.  His balance is improving including ability to use proprioception from socket-limb interface on prosthetic limb.  He reports dry needling has decreased muscle pain in right hip but still limited by OA.    Personal Factors and Comorbidities Comorbidity  3+;Fitness;Time since onset of injury/illness/exacerbation;Past/Current Experience    Comorbidities Left TTTA, left THA (05/23/20), arthritis, DM2, HTN, HLD, PVD    Examination-Activity Limitations Locomotion Level;Squat;Stairs;Stand;Transfers    Examination-Participation Restrictions Community Activity;Occupation    Stability/Clinical Decision Making Evolving/Moderate complexity    Rehab Potential Good    PT Frequency 2x / week    PT Duration 12 weeks    PT Treatment/Interventions ADLs/Self Care Home Management;DME Instruction;Gait training;Stair training;Functional mobility training;Therapeutic activities;Therapeutic exercise;Balance training;Neuromuscular re-education;Patient/family education;Prosthetic Training;Vestibular;Dry needling;Manual techniques    PT Next Visit Plan check STGs, work towards new STGs, prosthetic gait with cane,  Balance activities, therapeutic exercise, dry needling prn    PT Home Exercise Plan Access Code: [email or web address]  + sink HEP    Consulted and Agree with Plan of Care Patient             Patient will benefit from skilled therapeutic intervention in order to improve the following deficits and impairments:  Abnormal gait, Decreased activity tolerance, Decreased balance, Decreased endurance, Decreased knowledge of use of DME, Decreased mobility, Decreased range of motion, Decreased skin integrity, Decreased strength, Increased edema, Postural dysfunction, Prosthetic Dependency, Pain  Visit Diagnosis: Unsteadiness on feet  Stiffness of left knee, not elsewhere classified  Other abnormalities of gait and mobility  Muscle weakness (generalized)  Abnormal posture  Pain in left lower leg     Problem List Patient Active Problem List   Diagnosis Date Noted   S/P BKA (below knee amputation) unilateral, left ([place]) 10/03/2020   Non-healing wound of lower extremity 10/03/2020   History of amputation of left foot through metatarsal bone ([place]) 09/11/2020   PAD  (peripheral artery disease) ([place]) 08/20/2020   Status post total replacement of left hip 06/05/2020   Unilateral primary osteoarthritis, right hip 04/17/2020   Unilateral primary osteoarthritis, left hip 04/17/2020   Amputated toe, right ([place]) 11/22/2018   Type 2 diabetes mellitus with foot ulcer, without long-term current use of insulin ([place]) 11/22/2018   Essential hypertension 11/22/2018   Mixed hyperlipidemia 11/22/2018   Morbid obesity ([place]) 11/22/2018   Aneurysm of left popliteal artery ([place]) 11/22/2018   Status post peripheral artery angioplasty with insertion of stent 11/22/2018   Encounter for smoking cessation counseling 11/22/2018   Osteomyelitis of third toe of right foot ([place])    Cellulitis of third toe of right foot     Jamey Reas, PT, DPT 06/09/2021, 4:43 PM  Elsie Ra, PT, DPT 06/11/21 8:57 AM   [facility] [address] [place], [place], [address] Phone: [phone]   Fax:  [phone]  Name: Anthony Park MRN:  [id number] Date of Birth: 1961/06/22

## 2021-06-10 DIAGNOSIS — I10 Essential (primary) hypertension: Secondary | ICD-10-CM | POA: Diagnosis not present

## 2021-06-10 DIAGNOSIS — E1142 Type 2 diabetes mellitus with diabetic polyneuropathy: Secondary | ICD-10-CM | POA: Diagnosis not present

## 2021-06-10 DIAGNOSIS — Z23 Encounter for immunization: Secondary | ICD-10-CM | POA: Diagnosis not present

## 2021-06-10 DIAGNOSIS — D509 Iron deficiency anemia, unspecified: Secondary | ICD-10-CM | POA: Diagnosis not present

## 2021-06-11 ENCOUNTER — Other Ambulatory Visit: Payer: Self-pay

## 2021-06-11 ENCOUNTER — Encounter: Payer: Self-pay | Admitting: Physical Therapy

## 2021-06-11 ENCOUNTER — Ambulatory Visit (INDEPENDENT_AMBULATORY_CARE_PROVIDER_SITE_OTHER): Payer: BC Managed Care – PPO | Admitting: Physical Therapy

## 2021-06-11 DIAGNOSIS — R2681 Unsteadiness on feet: Secondary | ICD-10-CM | POA: Diagnosis not present

## 2021-06-11 DIAGNOSIS — M6281 Muscle weakness (generalized): Secondary | ICD-10-CM

## 2021-06-11 DIAGNOSIS — M25662 Stiffness of left knee, not elsewhere classified: Secondary | ICD-10-CM

## 2021-06-11 DIAGNOSIS — R2689 Other abnormalities of gait and mobility: Secondary | ICD-10-CM | POA: Diagnosis not present

## 2021-06-11 DIAGNOSIS — R293 Abnormal posture: Secondary | ICD-10-CM

## 2021-06-11 NOTE — Therapy (Addendum)
[facility] [address] [place], [place], [address] Phone: [phone]   Fax:  [phone]  Physical Therapy Treatment  Patient Details  Name: Anthony Park MRN: [id number] Date of Birth: [birth year] Referring Provider (PT): Harold Barban, MD   Encounter Date: 06/11/2021   PT End of Session - 06/11/21 0804     Visit Number 15    Number of Visits 25    Date for PT Re-Evaluation 07/17/21    Authorization Type [employer] comm ppo    Authorization Time Period DED MET AND OOP MET, 120 Visits PT/OT/ST COMBINED,Remaining 116 Visits    Authorization - Visit Number 15    Authorization - Number of Visits 116    PT Start Time 0759    PT Stop Time 0845    PT Time Calculation (min) 46 min    Equipment Utilized During Treatment Gait belt    Activity Tolerance Patient tolerated treatment well;Patient limited by pain    Behavior During Therapy WFL for tasks assessed/performed             Past Medical History:  Diagnosis Date   Arthritis    Diabetes mellitus without complication ([place])    type 2 controlled with diet    Diabetic toe ulcer ([place]) 11/05/2018   History of kidney stones    passed 1 several years ago   Hyperlipidemia    Hypertension    not on medications   Peripheral vascular disease ([place])    stent in left leg due to anerysm     Past Surgical History:  Procedure Laterality Date    Left Transmetatarsal Ampuation (Left Foot)  08/20/2020   ABDOMINAL AORTOGRAM W/LOWER EXTREMITY N/A 08/06/2020   Procedure: ABDOMINAL AORTOGRAM W/LOWER EXTREMITY;  Surgeon: Marty Heck, MD;  Location: [place] [facility];  Service: Cardiovascular;  Laterality: N/A;   AMPUTATION Right 11/07/2018   Procedure: RIGHT 3RD TOE AMPUTATION;  Surgeon: Newt Minion, MD;  Location: [place];  Service: Orthopedics;  Laterality: Right;   AMPUTATION Left 08/20/2020   Procedure: Left Transmetatarsal Ampuation;  Surgeon: Marty Heck, MD;  Location: [place];   Service: Vascular;  Laterality: Left;   AMPUTATION Left 10/03/2020   Procedure: LEFT BELOW KNEE AMPUTATION;  Surgeon: Marty Heck, MD;  Location: [place];  Service: Vascular;  Laterality: Left;   AMPUTATION TOE Left    big toe and one next to it   CATARACT EXTRACTION Bilateral    EYE SURGERY Bilateral    cataracts removed   PERIPHERAL VASCULAR BALLOON ANGIOPLASTY Left 08/06/2020   Procedure: PERIPHERAL VASCULAR BALLOON ANGIOPLASTY;  Surgeon: Marty Heck, MD;  Location: [place] [facility];  Service: Cardiovascular;  Laterality: Left;  Peroneal artery.   TOE AMPUTATION Left 2015   TOTAL HIP ARTHROPLASTY Left 05/23/2020   Procedure: LEFT TOTAL HIP ARTHROPLASTY ANTERIOR APPROACH;  Surgeon: Mcarthur Rossetti, MD;  Location: [facility];  Service: Orthopedics;  Laterality: Left;   WOUND DEBRIDEMENT Left 09/11/2020   Procedure: DEBRIDEMENT OF LEFT TRANSMETATARSAL WOUND;  Surgeon: Cherre Robins, MD;  Location: [place];  Service: Vascular;  Laterality: Left;    There were no vitals filed for this visit.   Subjective Assessment - 06/11/21 0759     Subjective HIs right hip is hurting more this morning. He was awoken at 3am.    Pertinent History Left TTTA, left THA (05/23/20), arthritis, DM2, HTN, HLD, PVD    Patient Stated Goals Wants to use prosthesis to return to some  work, get back in community, be mobile, work in gym in his garage    Currently in Pain? Yes    Pain Score 7     Pain Location Hip    Pain Orientation Right    Pain Descriptors / Indicators Aching;Sore;Sharp    Pain Type Chronic pain    Pain Onset More than a month ago    Pain Frequency Constant    Aggravating Factors  standing & walking    Pain Relieving Factors sitting on tennis ball                               [facility] Adult PT Treatment/Exercise - 06/11/21 0759       Transfers   Transfers Sit to Stand;Stand to Sit    Sit to Stand 5: Supervision;With upper extremity assist;From  chair/3-in-1;Other (comment)    Sit to Stand Details Visual cues/gestures for sequencing;Verbal cues for technique    Stand to Sit 5: Supervision;With upper extremity assist;To chair/3-in-1;Other (comment)    Stand to Sit Details (indicate cue type and reason) Verbal cues for technique;Visual cues/gestures for sequencing      Ambulation/Gait   Ambulation/Gait Yes    Ambulation/Gait Assistance 5: Supervision;4: Min assist   MinA cane & sup rollator walker   Ambulation Distance (Feet) 150 Feet   rollator x 2 to enter/exit and then 80'x 2 with cane stand alone tip   Assistive device Prosthesis;Rollator;Straight cane;1 person hand held assist   cane with stand alone tip & HHA   Ramp 3: Mod assist   TTA prosthesis & Cane / HHA   Ramp Details (indicate cue type and reason) verbal cues. descending causes increased right hip pain.    Curb 3: Mod assist   TTA prosthesis & Cane / HHA   Curb Details (indicate cue type and reason) leads with prosthesis first ascending & descending due to RLE hip pain. verbal cues on technique.      Neuro Re-ed    Neuro Re-ed Details  standing balance & stabiization:  green theraband alt. UEs & BUEs row, forward press & biceps curls 10 reps ea.      Knee/Hip Exercises: Stretches   Active Hamstring Stretch Both;30 seconds;1 rep      Knee/Hip Exercises: Aerobic   Nustep Seat 12 level 8 with BUEs & BLEs for 8 min      Knee/Hip Exercises: Machines for Strengthening   Total Gym Leg Press Shuttle leg press 138# 15 reps 2 sets. 1st set back 45* & 2nd set back flat      Knee/Hip Exercises: Standing   Rocker Board --    Rocker Board Limitations --      Manual Therapy   Manual Therapy Soft tissue mobilization;Muscle Energy Technique    Soft tissue mobilization soft tissue mobilizations with compression to right glut & piriformis.    Muscle Energy Technique contract relax for right hip flexor stretch      Prosthetics   Prosthetic Care Comments  --    Current prosthetic  wear tolerance (days/week)  daily    Current prosthetic wear tolerance (#hours/day)  most awake hours, removes prosthesis for massaging after standing, removes liner & prosthesis for 15-30 min 2-4 times per day,    Current prosthetic weight-bearing tolerance (hours/day)  Patient tolerates standing with weight on prosthesis with residual limb pain 2-3/10.  He reports pain 7-8/10 with wear >45-60 min at home.    Residual  limb condition  edema at distal tibia with pain on palpation,  wound on incision line at distal tibia with granulation, good hair growth, normal moisture & temperature    Education Provided Proper weight-bearing schedule/adjustment;Skin check;Residual limb care;Other (comment)   see prosthetic care comments             Trigger Point Dry Needling - 06/11/21 0759     Consent Given? Yes    Education Handout Provided Yes    Muscles Treated Back/Hip Piriformis    Piriformis Response Twitch response elicited            Dry Needling performed by Ivery Quale, PT, DPT.          PT Short Term Goals - 06/02/21 1253       PT SHORT TERM GOAL #1   Title Patient verbalizes proper sweat managment with prosthesis.    Time 4    Period Weeks    Status On-going    Target Date 06/18/21      PT SHORT TERM GOAL #2   Title Patient tolerates prosthesis >80% of awake hrs /day without increase in skin issues or limb pain <4/10 after standing.    Time 4    Period Weeks    Status On-going    Target Date 06/18/21      PT SHORT TERM GOAL #3   Title Patient able to pick up items from floor & reach 10" without UE support safely.    Time 4    Period Weeks    Status On-going    Target Date 06/18/21      PT SHORT TERM GOAL #4   Title Patient ambulates 200' with cane & prosthesis with supervision.    Time 4    Period Weeks    Status On-going    Target Date 06/18/21      PT SHORT TERM GOAL #5   Title Patient negotiates ramps & curbs with cane & prosthesis with minA.    Time  4    Period Weeks    Status On-going    Target Date 06/18/21               PT Long Term Goals - 05/19/21 0939       PT LONG TERM GOAL #1   Title Patient demonstrates & verbalized understanding of prosthetic care to enable safe utilization of prosthesis.    Time 12    Period Weeks    Status On-going    Target Date 07/17/21      PT LONG TERM GOAL #2   Title Patient tolerates prosthesis wear for >90% of awake hours without skin issues & limb pain </= 2/10.    Time 12    Period Weeks    Status On-going    Target Date 07/17/21      PT LONG TERM GOAL #3   Title Berg Balance >/= 36/56 to indicate lower fall risk    Time 12    Period Weeks    Status On-going    Target Date 07/17/21      PT LONG TERM GOAL #4   Title Patient ambulates 300' with LRAD & prosthesis modified independent    Time 12    Period Weeks    Status On-going    Target Date 07/17/21      PT LONG TERM GOAL #5   Title Patient negotiates ramps, curbs & stairs with LRAD & prosthesis modified independent.    Time 12  Period Weeks    Status On-going    Target Date 07/17/21                   Plan - 06/11/21 0805     Clinical Impression Statement Patient had increased arthritic right hip pain today limiting his mobliity.  He feels dry needling is keeping pain at level that he can be mobile until the right THA can be done.  Patient is improving with ability to weight bear & balance including gait with prosthesis.    Personal Factors and Comorbidities Comorbidity 3+;Fitness;Time since onset of injury/illness/exacerbation;Past/Current Experience    Comorbidities Left TTTA, left THA (05/23/20), arthritis, DM2, HTN, HLD, PVD    Examination-Activity Limitations Locomotion Level;Squat;Stairs;Stand;Transfers    Examination-Participation Restrictions Community Activity;Occupation    Stability/Clinical Decision Making Evolving/Moderate complexity    Rehab Potential Good    PT Frequency 2x / week    PT  Duration 12 weeks    PT Treatment/Interventions ADLs/Self Care Home Management;DME Instruction;Gait training;Stair training;Functional mobility training;Therapeutic activities;Therapeutic exercise;Balance training;Neuromuscular re-education;Patient/family education;Prosthetic Training;Vestibular;Dry needling;Manual techniques    PT Next Visit Plan check STGs next week, prosthetic gait with cane including ramp & curb increasing distance as tolerated by right hip pain,  Balance activities, therapeutic exercise, dry needling prn    PT Home Exercise Plan Access Code: [email or web address]  + sink HEP    Consulted and Agree with Plan of Care Patient             Patient will benefit from skilled therapeutic intervention in order to improve the following deficits and impairments:  Abnormal gait, Decreased activity tolerance, Decreased balance, Decreased endurance, Decreased knowledge of use of DME, Decreased mobility, Decreased range of motion, Decreased skin integrity, Decreased strength, Increased edema, Postural dysfunction, Prosthetic Dependency, Pain  Visit Diagnosis: Unsteadiness on feet  Stiffness of left knee, not elsewhere classified  Other abnormalities of gait and mobility  Muscle weakness (generalized)  Abnormal posture     Problem List Patient Active Problem List   Diagnosis Date Noted   S/P BKA (below knee amputation) unilateral, left ([place]) 10/03/2020   Non-healing wound of lower extremity 10/03/2020   History of amputation of left foot through metatarsal bone ([place]) 09/11/2020   PAD (peripheral artery disease) ([place]) 08/20/2020   Status post total replacement of left hip 06/05/2020   Unilateral primary osteoarthritis, right hip 04/17/2020   Unilateral primary osteoarthritis, left hip 04/17/2020   Amputated toe, right ([place]) 11/22/2018   Type 2 diabetes mellitus with foot ulcer, without long-term current use of insulin ([place]) 11/22/2018   Essential hypertension 11/22/2018   Mixed  hyperlipidemia 11/22/2018   Morbid obesity ([place]) 11/22/2018   Aneurysm of left popliteal artery ([place]) 11/22/2018   Status post peripheral artery angioplasty with insertion of stent 11/22/2018   Encounter for smoking cessation counseling 11/22/2018   Osteomyelitis of third toe of right foot ([place])    Cellulitis of third toe of right foot     Jamey Reas, PT, DPT 06/11/2021, 9:36 AM  Elsie Ra, PT, DPT 06/11/21 9:54 AM   [facility] [address] [place], [place], [address] Phone: [phone]   Fax:  [phone]  Name: Anthony Park MRN: [id number] Date of Birth: December 15, 1960

## 2021-06-16 ENCOUNTER — Telehealth: Payer: Self-pay | Admitting: Orthopaedic Surgery

## 2021-06-16 ENCOUNTER — Other Ambulatory Visit: Payer: Self-pay

## 2021-06-16 ENCOUNTER — Encounter: Payer: Self-pay | Admitting: Rehabilitation

## 2021-06-16 ENCOUNTER — Ambulatory Visit (INDEPENDENT_AMBULATORY_CARE_PROVIDER_SITE_OTHER): Payer: BC Managed Care – PPO | Admitting: Rehabilitation

## 2021-06-16 DIAGNOSIS — R2681 Unsteadiness on feet: Secondary | ICD-10-CM

## 2021-06-16 DIAGNOSIS — R2689 Other abnormalities of gait and mobility: Secondary | ICD-10-CM | POA: Diagnosis not present

## 2021-06-16 DIAGNOSIS — M6281 Muscle weakness (generalized): Secondary | ICD-10-CM | POA: Diagnosis not present

## 2021-06-16 NOTE — Telephone Encounter (Signed)
Pt came in just wanting to check on the status of his surgery. Is he still waiting for insurance to approve it? Is he on the list? He would just like an update overall. Pt states if it's easier he can discuss it through mychart because his phone is messed up.

## 2021-06-16 NOTE — Therapy (Signed)
[facility] [address] [place], [place], [address] Phone: [phone]   Fax:  [phone]  Physical Therapy Treatment  Patient Details  Name: Anthony Park MRN: [id number] Date of Birth: July 30, 1961 Referring Provider (PT): Harold Barban, MD   Encounter Date: 06/16/2021   PT End of Session - 06/16/21 1037     Visit Number 16    Number of Visits 25    Date for PT Re-Evaluation 07/17/21    Authorization Type BCBS comm ppo    Authorization Time Period DED MET AND OOP MET, 120 Visits PT/OT/ST COMBINED,Remaining 116 Visits    Authorization - Visit Number 16    Authorization - Number of Visits 116    PT Start Time 0802    PT Stop Time 0845    PT Time Calculation (min) 43 min    Equipment Utilized During Treatment Gait belt    Activity Tolerance Patient tolerated treatment well;Patient limited by pain    Behavior During Therapy WFL for tasks assessed/performed             Past Medical History:  Diagnosis Date   Arthritis    Diabetes mellitus without complication ([place])    type 2 controlled with diet    Diabetic toe ulcer ([place]) 11/05/2018   History of kidney stones    passed 1 several years ago   Hyperlipidemia    Hypertension    not on medications   Peripheral vascular disease ([place])    stent in left leg due to anerysm     Past Surgical History:  Procedure Laterality Date    Left Transmetatarsal Ampuation (Left Foot)  08/20/2020   ABDOMINAL AORTOGRAM W/LOWER EXTREMITY N/A 08/06/2020   Procedure: ABDOMINAL AORTOGRAM W/LOWER EXTREMITY;  Surgeon: Marty Heck, MD;  Location: [place] [facility];  Service: Cardiovascular;  Laterality: N/A;   AMPUTATION Right 11/07/2018   Procedure: RIGHT 3RD TOE AMPUTATION;  Surgeon: Newt Minion, MD;  Location: [place];  Service: Orthopedics;  Laterality: Right;   AMPUTATION Left 08/20/2020   Procedure: Left Transmetatarsal Ampuation;  Surgeon: Marty Heck, MD;  Location: [place];   Service: Vascular;  Laterality: Left;   AMPUTATION Left 10/03/2020   Procedure: LEFT BELOW KNEE AMPUTATION;  Surgeon: Marty Heck, MD;  Location: [place];  Service: Vascular;  Laterality: Left;   AMPUTATION TOE Left    big toe and one next to it   CATARACT EXTRACTION Bilateral    EYE SURGERY Bilateral    cataracts removed   PERIPHERAL VASCULAR BALLOON ANGIOPLASTY Left 08/06/2020   Procedure: PERIPHERAL VASCULAR BALLOON ANGIOPLASTY;  Surgeon: Marty Heck, MD;  Location: [place] [facility];  Service: Cardiovascular;  Laterality: Left;  Peroneal artery.   TOE AMPUTATION Left 2015   TOTAL HIP ARTHROPLASTY Left 05/23/2020   Procedure: LEFT TOTAL HIP ARTHROPLASTY ANTERIOR APPROACH;  Surgeon: Mcarthur Rossetti, MD;  [facility];  Service: Orthopedics;  Laterality: Left;   WOUND DEBRIDEMENT Left 09/11/2020   Procedure: DEBRIDEMENT OF LEFT TRANSMETATARSAL WOUND;  Surgeon: Cherre Robins, MD;  Location: [place];  Service: Vascular;  Laterality: Left;    There were no vitals filed for this visit.   Subjective Assessment - 06/16/21 0806     Subjective Pt reports hip pain is very high today and was up all night with pain.  Tylenol did not help.    Pertinent History Left TTTA, left THA (05/23/20), arthritis, DM2, HTN, HLD, PVD    Patient Stated Goals Wants  to use prosthesis to return to some work, get back in community, be mobile, work in gym in his garage    Currently in Pain? Yes    Pain Score 7     Pain Location Hip    Pain Orientation Right    Pain Descriptors / Indicators Aching;Sore;Sharp    Pain Type Chronic pain    Pain Onset More than a month ago    Pain Frequency Constant    Aggravating Factors  standing and walking    Pain Relieving Factors sitting on tennis ball                               [facility] Adult PT Treatment/Exercise - 06/16/21 0809       Transfers   Transfers Sit to Stand;Stand to Sit    Sit to Stand 5: Supervision;With  upper extremity assist;From chair/3-in-1;Other (comment)    Sit to Stand Details Visual cues/gestures for sequencing;Verbal cues for technique    Stand to Sit 5: Supervision;With upper extremity assist;To chair/3-in-1;Other (comment)    Stand to Sit Details (indicate cue type and reason) Verbal cues for technique;Visual cues/gestures for sequencing      Ambulation/Gait   Ambulation/Gait Yes    Ambulation/Gait Assistance 5: Supervision    Ambulation/Gait Assistance Details Into and out of clinic with rollator at S level.  Note marked pain and stiffness in R hip.    Ambulation Distance (Feet) 150 Feet    Assistive device Prosthesis;Rollator      Knee/Hip Exercises: Stretches   Active Hamstring Stretch Both;30 seconds;1 rep    Active Hamstring Stretch Limitations Standing at counter top x 1 min x 2 sets      Knee/Hip Exercises: Aerobic   Nustep Seat 12 level 8 with BUEs & BLEs for 8 min      Knee/Hip Exercises: Standing   Hip Abduction Stengthening;Both;1 set;10 reps    Abduction Limitations Cues for posture and closeness to countertop    Hip Extension Stengthening;Right;1 set;5 sets    Functional Squat 1 set;10 reps   mini squat at counter top   Other Standing Knee Exercises Pt showed PT exercises from virtual PT sessions and ones that he is unable to do due to high hip pain.  Educated verbally on how to modify standig quad stretch to prone (as in hip flex stretch) with strap around foot and self moderating how far to pull foot towards head.  Pt verbalized understanding.    Other Standing Knee Exercises Standing LLE flex/ext swings for R hip stabilization and L hip strength x 10 reps.  Pt unable to do without BUE support due to pain.  Also note crepitus in R hip so discontinued.      Knee/Hip Exercises: Prone   Other Prone Exercises hip flex stretch x 4 mins.  PT adding slight overpressure with knee flex on R side for increased stretch.  Also performed manual therapy to R piriformis in  prone position, see manual therapy section.      Manual Therapy   Manual Therapy Soft tissue mobilization;Myofascial release    Manual therapy comments STM and trigger point massage to R piriformis    Soft tissue mobilization soft tissue mobilizations with compression to right glut & piriformis.    Myofascial Release piriformis trigger point release in prone      Prosthetics   Current prosthetic wear tolerance (days/week)  daily    Current prosthetic wear tolerance (#  hours/day)  most awake hours, removes prosthesis for massaging after standing, removes liner & prosthesis for 15-30 min 2-4 times per day,    Current prosthetic weight-bearing tolerance (hours/day)  Patient tolerates standing with weight on prosthesis with residual limb pain 2-3/10.  He reports pain 7-8/10 with wear >45-60 min at home.    Residual limb condition  edema at distal tibia with pain on palpation,  wound on incision line at distal tibia with granulation, good hair growth, normal moisture & temperature              Trigger Point Dry Needling - 06/16/21 0001     Consent Given? Yes    Education Handout Provided Yes    Muscles Treated Back/Hip Piriformis    Piriformis Response Twitch response elicited                     PT Short Term Goals - 06/02/21 1253       PT SHORT TERM GOAL #1   Title Patient verbalizes proper sweat managment with prosthesis.    Time 4    Period Weeks    Status On-going    Target Date 06/18/21      PT SHORT TERM GOAL #2   Title Patient tolerates prosthesis >80% of awake hrs /day without increase in skin issues or limb pain <4/10 after standing.    Time 4    Period Weeks    Status On-going    Target Date 06/18/21      PT SHORT TERM GOAL #3   Title Patient able to pick up items from floor & reach 10" without UE support safely.    Time 4    Period Weeks    Status On-going    Target Date 06/18/21      PT SHORT TERM GOAL #4   Title Patient ambulates 200' with cane  & prosthesis with supervision.    Time 4    Period Weeks    Status On-going    Target Date 06/18/21      PT SHORT TERM GOAL #5   Title Patient negotiates ramps & curbs with cane & prosthesis with minA.    Time 4    Period Weeks    Status On-going    Target Date 06/18/21               PT Long Term Goals - 05/19/21 0939       PT LONG TERM GOAL #1   Title Patient demonstrates & verbalized understanding of prosthetic care to enable safe utilization of prosthesis.    Time 12    Period Weeks    Status On-going    Target Date 07/17/21      PT LONG TERM GOAL #2   Title Patient tolerates prosthesis wear for >90% of awake hours without skin issues & limb pain </= 2/10.    Time 12    Period Weeks    Status On-going    Target Date 07/17/21      PT LONG TERM GOAL #3   Title Berg Balance >/= 36/56 to indicate lower fall risk    Time 12    Period Weeks    Status On-going    Target Date 07/17/21      PT LONG TERM GOAL #4   Title Patient ambulates 300' with LRAD & prosthesis modified independent    Time 12    Period Weeks    Status On-going    Target  Date 07/17/21      PT LONG TERM GOAL #5   Title Patient negotiates ramps, curbs & stairs with LRAD & prosthesis modified independent.    Time 12    Period Weeks    Status On-going    Target Date 07/17/21                   Plan - 06/16/21 1038     Clinical Impression Statement Skilled session limited due to high pain in hip.  Focused on stretching, strengthening and dry needling to piriformis.  Did not assess goals today due to high level of pain.    Personal Factors and Comorbidities Comorbidity 3+;Fitness;Time since onset of injury/illness/exacerbation;Past/Current Experience    Comorbidities Left TTTA, left THA (05/23/20), arthritis, DM2, HTN, HLD, PVD    Examination-Activity Limitations Locomotion Level;Squat;Stairs;Stand;Transfers    Examination-Participation Restrictions Community Activity;Occupation     Stability/Clinical Decision Making Evolving/Moderate complexity    Rehab Potential Good    PT Frequency 2x / week    PT Duration 12 weeks    PT Treatment/Interventions ADLs/Self Care Home Management;DME Instruction;Gait training;Stair training;Functional mobility training;Therapeutic activities;Therapeutic exercise;Balance training;Neuromuscular re-education;Patient/family education;Prosthetic Training;Vestibular;Dry needling;Manual techniques    PT Next Visit Plan check STGs next week-Robin I didn't check due to high pain , prosthetic gait with cane including ramp & curb increasing distance as tolerated by right hip pain,  Balance activities, therapeutic exercise, dry needling prn    PT Home Exercise Plan Access Code: [email or web address]  + sink HEP    Consulted and Agree with Plan of Care Patient             Patient will benefit from skilled therapeutic intervention in order to improve the following deficits and impairments:  Abnormal gait, Decreased activity tolerance, Decreased balance, Decreased endurance, Decreased knowledge of use of DME, Decreased mobility, Decreased range of motion, Decreased skin integrity, Decreased strength, Increased edema, Postural dysfunction, Prosthetic Dependency, Pain  Visit Diagnosis: Unsteadiness on feet  Other abnormalities of gait and mobility  Muscle weakness (generalized)     Problem List Patient Active Problem List   Diagnosis Date Noted   S/P BKA (below knee amputation) unilateral, left ([place]) 10/03/2020   Non-healing wound of lower extremity 10/03/2020   History of amputation of left foot through metatarsal bone ([place]) 09/11/2020   PAD (peripheral artery disease) ([place]) 08/20/2020   Status post total replacement of left hip 06/05/2020   Unilateral primary osteoarthritis, right hip 04/17/2020   Unilateral primary osteoarthritis, left hip 04/17/2020   Amputated toe, right ([place]) 11/22/2018   Type 2 diabetes mellitus with foot ulcer, without long-term  current use of insulin ([place]) 11/22/2018   Essential hypertension 11/22/2018   Mixed hyperlipidemia 11/22/2018   Morbid obesity ([place]) 11/22/2018   Aneurysm of left popliteal artery ([place]) 11/22/2018   Status post peripheral artery angioplasty with insertion of stent 11/22/2018   Encounter for smoking cessation counseling 11/22/2018   Osteomyelitis of third toe of right foot ([place])    Cellulitis of third toe of right foot     Cameron Sprang, PT, MPT  06/16/21, 10:41 AM   [facility] [address] [place], [place], [address] Phone: [phone]   Fax:  [phone]  Name: Anthony Park MRN: [id number] Date of Birth: 06/03/1961

## 2021-06-17 ENCOUNTER — Encounter: Payer: Self-pay | Admitting: Physical Therapy

## 2021-06-17 NOTE — Telephone Encounter (Signed)
I called patient and left voice mail.  I sent My Chart message to patient about possibly surgery on 07-31-21.

## 2021-06-18 ENCOUNTER — Encounter: Payer: BC Managed Care – PPO | Admitting: Physical Therapy

## 2021-06-18 ENCOUNTER — Other Ambulatory Visit: Payer: Self-pay

## 2021-06-18 ENCOUNTER — Ambulatory Visit (INDEPENDENT_AMBULATORY_CARE_PROVIDER_SITE_OTHER): Payer: BC Managed Care – PPO | Admitting: Physical Therapy

## 2021-06-18 DIAGNOSIS — R293 Abnormal posture: Secondary | ICD-10-CM

## 2021-06-18 DIAGNOSIS — R2681 Unsteadiness on feet: Secondary | ICD-10-CM

## 2021-06-18 DIAGNOSIS — M25662 Stiffness of left knee, not elsewhere classified: Secondary | ICD-10-CM

## 2021-06-18 DIAGNOSIS — M79662 Pain in left lower leg: Secondary | ICD-10-CM

## 2021-06-18 DIAGNOSIS — Z9181 History of falling: Secondary | ICD-10-CM

## 2021-06-18 DIAGNOSIS — R2689 Other abnormalities of gait and mobility: Secondary | ICD-10-CM | POA: Diagnosis not present

## 2021-06-18 DIAGNOSIS — M6281 Muscle weakness (generalized): Secondary | ICD-10-CM | POA: Diagnosis not present

## 2021-06-18 NOTE — Therapy (Signed)
[facility] [address] [place], [place], [address] Phone: [phone]   Fax:  [phone]  Physical Therapy Treatment  Patient Details  Name: Anthony Park MRN: [id number] Date of Birth: [birth year] Referring Provider (PT): Harold Barban, MD   Encounter Date: 06/18/2021   PT End of Session - 06/18/21 1428     Visit Number 17    Number of Visits 25    Date for PT Re-Evaluation 07/17/21    Authorization Type BCBS comm ppo    Authorization Time Period DED MET AND OOP MET, 120 Visits PT/OT/ST COMBINED,Remaining 116 Visits    Authorization - Visit Number 17    Authorization - Number of Visits 116    PT Start Time 1300    PT Stop Time 1345    PT Time Calculation (min) 45 min    Equipment Utilized During Treatment Gait belt    Activity Tolerance Patient tolerated treatment well;Patient limited by pain    Behavior During Therapy WFL for tasks assessed/performed             Past Medical History:  Diagnosis Date   Arthritis    Diabetes mellitus without complication ([place])    type 2 controlled with diet    Diabetic toe ulcer ([place]) 11/05/2018   History of kidney stones    passed 1 several years ago   Hyperlipidemia    Hypertension    not on medications   Peripheral vascular disease ([place])    stent in left leg due to anerysm     Past Surgical History:  Procedure Laterality Date    Left Transmetatarsal Ampuation (Left Foot)  08/20/2020   ABDOMINAL AORTOGRAM W/LOWER EXTREMITY N/A 08/06/2020   Procedure: ABDOMINAL AORTOGRAM W/LOWER EXTREMITY;  Surgeon: Marty Heck, MD;  Location: [place] [facility];  Service: Cardiovascular;  Laterality: N/A;   AMPUTATION Right 11/07/2018   Procedure: RIGHT 3RD TOE AMPUTATION;  Surgeon: Newt Minion, MD;  Location: [place];  Service: Orthopedics;  Laterality: Right;   AMPUTATION Left 08/20/2020   Procedure: Left Transmetatarsal Ampuation;  Surgeon: Marty Heck, MD;  Location: [place];   Service: Vascular;  Laterality: Left;   AMPUTATION Left 10/03/2020   Procedure: LEFT BELOW KNEE AMPUTATION;  Surgeon: Marty Heck, MD;  Location: [place];  Service: Vascular;  Laterality: Left;   AMPUTATION TOE Left    big toe and one next to it   CATARACT EXTRACTION Bilateral    EYE SURGERY Bilateral    cataracts removed   PERIPHERAL VASCULAR BALLOON ANGIOPLASTY Left 08/06/2020   Procedure: PERIPHERAL VASCULAR BALLOON ANGIOPLASTY;  Surgeon: Marty Heck, MD;  Location: [place] [facility];  Service: Cardiovascular;  Laterality: Left;  Peroneal artery.   TOE AMPUTATION Left 2015   TOTAL HIP ARTHROPLASTY Left 05/23/2020   Procedure: LEFT TOTAL HIP ARTHROPLASTY ANTERIOR APPROACH;  Surgeon: Mcarthur Rossetti, MD;  [facility];  Service: Orthopedics;  Laterality: Left;   WOUND DEBRIDEMENT Left 09/11/2020   Procedure: DEBRIDEMENT OF LEFT TRANSMETATARSAL WOUND;  Surgeon: Cherre Robins, MD;  Location: [place];  Service: Vascular;  Laterality: Left;    There were no vitals filed for this visit.   Subjective Assessment - 06/18/21 1351     Subjective Relays he is in a lot of hip pain after prolonged standing and going up stairs for a home inspection yesteday. He does relay he will have his hip surgery in November now.    Pertinent History Left TTTA, left THA (  05/23/20), arthritis, DM2, HTN, HLD, PVD    Patient Stated Goals Wants to use prosthesis to return to some work, get back in community, be mobile, work in gym in his garage    Pain Onset More than a month ago                               [facility] Adult PT Treatment/Exercise - 06/18/21 0001       Transfers   Transfers Sit to Stand;Stand to Sit    Sit to Stand 5: Supervision;With upper extremity assist;From chair/3-in-1;Other (comment)    Sit to Stand Details Visual cues/gestures for sequencing;Verbal cues for technique    Stand to Sit 5: Supervision;With upper extremity assist;To  chair/3-in-1;Other (comment)    Stand to Sit Details (indicate cue type and reason) Verbal cues for technique;Visual cues/gestures for sequencing      Ambulation/Gait   Ambulation/Gait Yes    Ambulation/Gait Assistance 5: Supervision    Ambulation/Gait Assistance Details Into and out of clinic with rollator at S level.  Note marked pain and stiffness in R hip that improved after DN    Ambulation Distance (Feet) 150 Feet    Assistive device Prosthesis;Rollator      Neuro Re-ed    Neuro Re-ed Details  sidestepping and retro walking in bars 2 round trips ea      Knee/Hip Exercises: Machines for Strengthening   Cybex Knee Extension 5# up with using more of his Rt leg and down with using more of his left leg 2X10    Cybex Knee Flexion 25# DL 2X20      Manual Therapy   Manual therapy comments compresson and skilled Palpaiton with DN      Prosthetics   Prosthetic Care Comments  He had too many clicks in sitting when donning prosthesis after DN so we had him add 3 ply sock to his already 5 ply for improved fit, reviewed adding and substracting socks and he showed good understanding              Trigger Point Dry Needling - 06/18/21 0001     Consent Given? Yes    Education Handout Provided Previously provided    Muscles Treated Back/Hip Piriformis    Dry Needling Comments with and without E-stim at milli amp current at 100 frequency at intensity 10    Piriformis Response Twitch response elicited                     PT Short Term Goals - 06/02/21 1253       PT SHORT TERM GOAL #1   Title Patient verbalizes proper sweat managment with prosthesis.    Time 4    Period Weeks    Status On-going    Target Date 06/18/21      PT SHORT TERM GOAL #2   Title Patient tolerates prosthesis >80% of awake hrs /day without increase in skin issues or limb pain <4/10 after standing.    Time 4    Period Weeks    Status On-going    Target Date 06/18/21      PT SHORT TERM GOAL #3    Title Patient able to pick up items from floor & reach 10" without UE support safely.    Time 4    Period Weeks    Status On-going    Target Date 06/18/21      PT SHORT TERM  GOAL #4   Title Patient ambulates 200' with cane & prosthesis with supervision.    Time 4    Period Weeks    Status On-going    Target Date 06/18/21      PT SHORT TERM GOAL #5   Title Patient negotiates ramps & curbs with cane & prosthesis with minA.    Time 4    Period Weeks    Status On-going    Target Date 06/18/21               PT Long Term Goals - 05/19/21 0939       PT LONG TERM GOAL #1   Title Patient demonstrates & verbalized understanding of prosthetic care to enable safe utilization of prosthesis.    Time 12    Period Weeks    Status On-going    Target Date 07/17/21      PT LONG TERM GOAL #2   Title Patient tolerates prosthesis wear for >90% of awake hours without skin issues & limb pain </= 2/10.    Time 12    Period Weeks    Status On-going    Target Date 07/17/21      PT LONG TERM GOAL #3   Title Berg Balance >/= 36/56 to indicate lower fall risk    Time 12    Period Weeks    Status On-going    Target Date 07/17/21      PT LONG TERM GOAL #4   Title Patient ambulates 300' with LRAD & prosthesis modified independent    Time 12    Period Weeks    Status On-going    Target Date 07/17/21      PT LONG TERM GOAL #5   Title Patient negotiates ramps, curbs & stairs with LRAD & prosthesis modified independent.    Time 12    Period Weeks    Status On-going    Target Date 07/17/21                   Plan - 06/18/21 1429     Clinical Impression Statement he continues to be limited with standing activity due to Rt hip pain. At least now he has hip surgery scheduled for mid november. We continued with DN which did significantly improve his pain. We worked on more sitting strengthening today due to his standing hip pain using the leg exension and leg curl machine today and  he had good tolerance to this, relays he liked these machines.    Personal Factors and Comorbidities Comorbidity 3+;Fitness;Time since onset of injury/illness/exacerbation;Past/Current Experience    Comorbidities Left TTTA, left THA (05/23/20), arthritis, DM2, HTN, HLD, PVD    Examination-Activity Limitations Locomotion Level;Squat;Stairs;Stand;Transfers    Examination-Participation Restrictions Community Activity;Occupation    Stability/Clinical Decision Making Evolving/Moderate complexity    Rehab Potential Good    PT Frequency 2x / week    PT Duration 12 weeks    PT Treatment/Interventions ADLs/Self Care Home Management;DME Instruction;Gait training;Stair training;Functional mobility training;Therapeutic activities;Therapeutic exercise;Balance training;Neuromuscular re-education;Patient/family education;Prosthetic Training;Vestibular;Dry needling;Manual techniques    PT Next Visit Plan check STGs next week since we didn't check due to high pain , prosthetic gait with cane including ramp & curb increasing distance as tolerated by right hip pain,  Balance activities, therapeutic exercise, dry needling prn    PT Home Exercise Plan Access Code: [email or web address]  + sink HEP    Consulted and Agree with Plan of Care Patient  Patient will benefit from skilled therapeutic intervention in order to improve the following deficits and impairments:  Abnormal gait, Decreased activity tolerance, Decreased balance, Decreased endurance, Decreased knowledge of use of DME, Decreased mobility, Decreased range of motion, Decreased skin integrity, Decreased strength, Increased edema, Postural dysfunction, Prosthetic Dependency, Pain  Visit Diagnosis: Unsteadiness on feet  Other abnormalities of gait and mobility  Muscle weakness (generalized)  Stiffness of left knee, not elsewhere classified  Abnormal posture  Pain in left lower leg  History of falling     Problem List Patient Active Problem  List   Diagnosis Date Noted   S/P BKA (below knee amputation) unilateral, left ([place]) 10/03/2020   Non-healing wound of lower extremity 10/03/2020   History of amputation of left foot through metatarsal bone ([place]) 09/11/2020   PAD (peripheral artery disease) ([place]) 08/20/2020   Status post total replacement of left hip 06/05/2020   Unilateral primary osteoarthritis, right hip 04/17/2020   Unilateral primary osteoarthritis, left hip 04/17/2020   Amputated toe, right ([place]) 11/22/2018   Type 2 diabetes mellitus with foot ulcer, without long-term current use of insulin ([place]) 11/22/2018   Essential hypertension 11/22/2018   Mixed hyperlipidemia 11/22/2018   Morbid obesity ([place]) 11/22/2018   Aneurysm of left popliteal artery ([place]) 11/22/2018   Status post peripheral artery angioplasty with insertion of stent 11/22/2018   Encounter for smoking cessation counseling 11/22/2018   Osteomyelitis of third toe of right foot ([place])    Cellulitis of third toe of right foot     Debbe Odea, PT,DPT 06/18/2021, 2:32 PM  [facility] [address] [place], [place], [address] Phone: [phone]   Fax:  [phone]  Name: KAYDAN WONG MRN: [id number] Date of Birth: 12/25/1960

## 2021-06-22 DIAGNOSIS — L0889 Other specified local infections of the skin and subcutaneous tissue: Secondary | ICD-10-CM | POA: Diagnosis not present

## 2021-06-22 DIAGNOSIS — E1142 Type 2 diabetes mellitus with diabetic polyneuropathy: Secondary | ICD-10-CM | POA: Diagnosis not present

## 2021-06-23 ENCOUNTER — Encounter: Payer: Self-pay | Admitting: Physical Therapy

## 2021-06-23 ENCOUNTER — Other Ambulatory Visit: Payer: Self-pay

## 2021-06-23 ENCOUNTER — Ambulatory Visit (INDEPENDENT_AMBULATORY_CARE_PROVIDER_SITE_OTHER): Payer: BC Managed Care – PPO | Admitting: Physical Therapy

## 2021-06-23 DIAGNOSIS — R2689 Other abnormalities of gait and mobility: Secondary | ICD-10-CM

## 2021-06-23 DIAGNOSIS — M25662 Stiffness of left knee, not elsewhere classified: Secondary | ICD-10-CM | POA: Diagnosis not present

## 2021-06-23 DIAGNOSIS — M6281 Muscle weakness (generalized): Secondary | ICD-10-CM

## 2021-06-23 DIAGNOSIS — R293 Abnormal posture: Secondary | ICD-10-CM

## 2021-06-23 DIAGNOSIS — M79662 Pain in left lower leg: Secondary | ICD-10-CM

## 2021-06-23 DIAGNOSIS — R2681 Unsteadiness on feet: Secondary | ICD-10-CM

## 2021-06-23 NOTE — Therapy (Signed)
[facility] Physical Therapy [address] [place], [place], [address] Phone: [phone]   Fax:  [phone]  Physical Therapy Treatment  Patient Details  Name: Anthony Park MRN: [id number] Date of Birth: July 02, 1961 Referring Provider (PT): Harold Barban, MD   Encounter Date: 06/23/2021   PT End of Session - 06/23/21 0808     Visit Number 18    Number of Visits 25    Date for PT Re-Evaluation 07/17/21    Authorization Type BCBS comm ppo    Authorization Time Period DED MET AND OOP MET, 120 Visits PT/OT/ST COMBINED,Remaining 116 Visits    Authorization - Visit Number 17    Authorization - Number of Visits 116    PT Start Time 0800    PT Stop Time 0835    PT Time Calculation (min) 35 min    Equipment Utilized During Treatment Gait belt    Activity Tolerance Patient tolerated treatment well;Patient limited by pain    Behavior During Therapy WFL for tasks assessed/performed             Past Medical History:  Diagnosis Date   Arthritis    Diabetes mellitus without complication ([place])    type 2 controlled with diet    Diabetic toe ulcer ([place]) 11/05/2018   History of kidney stones    passed 1 several years ago   Hyperlipidemia    Hypertension    not on medications   Peripheral vascular disease ([place])    stent in left leg due to anerysm     Past Surgical History:  Procedure Laterality Date    Left Transmetatarsal Ampuation (Left Foot)  08/20/2020   ABDOMINAL AORTOGRAM W/LOWER EXTREMITY N/A 08/06/2020   Procedure: ABDOMINAL AORTOGRAM W/LOWER EXTREMITY;  Surgeon: Marty Heck, MD;  Location: [place] [facility];  Service: Cardiovascular;  Laterality: N/A;   AMPUTATION Right 11/07/2018   Procedure: RIGHT 3RD TOE AMPUTATION;  Surgeon: Newt Minion, MD;  Location: [place];  Service: Orthopedics;  Laterality: Right;   AMPUTATION Left 08/20/2020   Procedure: Left Transmetatarsal Ampuation;  Surgeon: Marty Heck, MD;  Location: [place];   Service: Vascular;  Laterality: Left;   AMPUTATION Left 10/03/2020   Procedure: LEFT BELOW KNEE AMPUTATION;  Surgeon: Marty Heck, MD;  Location: [place];  Service: Vascular;  Laterality: Left;   AMPUTATION TOE Left    big toe and one next to it   CATARACT EXTRACTION Bilateral    EYE SURGERY Bilateral    cataracts removed   PERIPHERAL VASCULAR BALLOON ANGIOPLASTY Left 08/06/2020   Procedure: PERIPHERAL VASCULAR BALLOON ANGIOPLASTY;  Surgeon: Marty Heck, MD;  Location: [place] [facility];  Service: Cardiovascular;  Laterality: Left;  Peroneal artery.   TOE AMPUTATION Left 2015   TOTAL HIP ARTHROPLASTY Left 05/23/2020   Procedure: LEFT TOTAL HIP ARTHROPLASTY ANTERIOR APPROACH;  Surgeon: Mcarthur Rossetti, MD;  [facility]: [facility];  Service: Orthopedics;  Laterality: Left;   WOUND DEBRIDEMENT Left 09/11/2020   Procedure: DEBRIDEMENT OF LEFT TRANSMETATARSAL WOUND;  Surgeon: Cherre Robins, MD;  Location: [place];  Service: Vascular;  Laterality: Left;    There were no vitals filed for this visit.   Subjective Assessment - 06/23/21 0800     Subjective He noticed signs of infection in residual limb Saturday.  He got oral antibiodic.  He saw PA at PCP office.    Pertinent History Left TTTA, left THA (05/23/20), arthritis, DM2, HTN, HLD, PVD    Patient Stated Goals  Wants to use prosthesis to return to some work, get back in community, be mobile, work in gym in his garage    Currently in Pain? Yes    Pain Score 3    over weekend was 10/10   Pain Location Leg   residual limb   Pain Orientation Left;Distal    Pain Descriptors / Indicators Burning    Pain Type Acute pain    Pain Onset More than a month ago    Pain Frequency Constant    Aggravating Factors  infection in residual limb    Pain Relieving Factors meds    Pain Score 3   over last week, lowest 0-1/10 worst 8-9/10   Pain Location Hip    Pain Orientation Right    Pain Descriptors / Indicators Aching;Sore    Pain  Type Chronic pain    Pain Onset More than a month ago    Pain Frequency Intermittent    Aggravating Factors  staying in one position too long    Pain Relieving Factors moving                               [facility] Adult PT Treatment/Exercise - 06/23/21 0800       Knee/Hip Exercises: Standing   Functional Squat --   mini squat at counter top   Rocker Board 1 minute   BUE light support, ant/level/post and right/level/left   Rocker Board Limitations mirror & verbal cues for balance reactions    Other Standing Knee Exercises stood for 1 min on foam hip width stance head turns 5 reps. Stinging pain in residual limb limiting weight bearing.      Prosthetics   Prosthetic Care Comments  Dr Sharol Given came to PT clinic and assessed limb.  He feels infection is isolated to wound at distal tibia.  He set up appt next Tuesday to determine if infection has cleared or needs longer duration of Doxycyclin.  PT advised to wear prosthesis with large size Vivewear shrinker under liner as tolerated.    Current prosthetic wear tolerance (days/week)  daily    Current prosthetic wear tolerance (#hours/day)  most awake hours, removes prosthesis for massaging after standing, removes liner & prosthesis for 15-30 min 2-4 times per day,    Current prosthetic weight-bearing tolerance (hours/day)  Patient tolerates standing with weight on prosthesis with residual limb pain 2-3/10.  He reports pain 7-8/10 with wear >45-60 min at home.    Residual limb condition  pitting edema, redness & slight warmth at distal limb,    Education Provided Other (comment)   see prosthetic care comments   Person(s) Educated Patient    Education Method Explanation;Verbal cues    Education Method Verbalized understanding;Verbal cues required    Donning Prosthesis Modified independent (device/increased time)                       PT Short Term Goals - 06/02/21 1253       PT SHORT TERM GOAL #1   Title Patient  verbalizes proper sweat managment with prosthesis.    Time 4    Period Weeks    Status On-going    Target Date 06/18/21      PT SHORT TERM GOAL #2   Title Patient tolerates prosthesis >80% of awake hrs /day without increase in skin issues or limb pain <4/10 after standing.    Time 4  Period Weeks    Status On-going    Target Date 06/18/21      PT SHORT TERM GOAL #3   Title Patient able to pick up items from floor & reach 10" without UE support safely.    Time 4    Period Weeks    Status On-going    Target Date 06/18/21      PT SHORT TERM GOAL #4   Title Patient ambulates 200' with cane & prosthesis with supervision.    Time 4    Period Weeks    Status On-going    Target Date 06/18/21      PT SHORT TERM GOAL #5   Title Patient negotiates ramps & curbs with cane & prosthesis with minA.    Time 4    Period Weeks    Status On-going    Target Date 06/18/21               PT Long Term Goals - 05/19/21 0939       PT LONG TERM GOAL #1   Title Patient demonstrates & verbalized understanding of prosthetic care to enable safe utilization of prosthesis.    Time 12    Period Weeks    Status On-going    Target Date 07/17/21      PT LONG TERM GOAL #2   Title Patient tolerates prosthesis wear for >90% of awake hours without skin issues & limb pain </= 2/10.    Time 12    Period Weeks    Status On-going    Target Date 07/17/21      PT LONG TERM GOAL #3   Title Berg Balance >/= 36/56 to indicate lower fall risk    Time 12    Period Weeks    Status On-going    Target Date 07/17/21      PT LONG TERM GOAL #4   Title Patient ambulates 300' with LRAD & prosthesis modified independent    Time 12    Period Weeks    Status On-going    Target Date 07/17/21      PT LONG TERM GOAL #5   Title Patient negotiates ramps, curbs & stairs with LRAD & prosthesis modified independent.    Time 12    Period Weeks    Status On-going    Target Date 07/17/21                    Plan - 06/23/21 0809     Clinical Impression Statement Patient has infection in residual limb at distal tibia.  Pain with weight bearing too much today to tolerate weightbearing through prosthesis.  Dr Sharol Given consulted today and will reassess next week.  If limb is too painful for weight bearing onThursday then he is to cancel appt with PT.    Personal Factors and Comorbidities Comorbidity 3+;Fitness;Time since onset of injury/illness/exacerbation;Past/Current Experience    Comorbidities Left TTTA, left THA (05/23/20), arthritis, DM2, HTN, HLD, PVD    Examination-Activity Limitations Locomotion Level;Squat;Stairs;Stand;Transfers    Examination-Participation Restrictions Community Activity;Occupation    Stability/Clinical Decision Making Evolving/Moderate complexity    Rehab Potential Good    PT Frequency 2x / week    PT Duration 12 weeks    PT Treatment/Interventions ADLs/Self Care Home Management;DME Instruction;Gait training;Stair training;Functional mobility training;Therapeutic activities;Therapeutic exercise;Balance training;Neuromuscular re-education;Patient/family education;Prosthetic Training;Vestibular;Dry needling;Manual techniques    PT Next Visit Plan assess wound on residual limb, if tolerated check STGs, prosthetic gait with cane including ramp & curb  increasing distance as tolerated by right hip pain,  Balance activities, therapeutic exercise, no dry needling until infection clears & off antibiodic    PT Home Exercise Plan Access Code: [email or web address]  + sink HEP    Consulted and Agree with Plan of Care Patient             Patient will benefit from skilled therapeutic intervention in order to improve the following deficits and impairments:  Abnormal gait, Decreased activity tolerance, Decreased balance, Decreased endurance, Decreased knowledge of use of DME, Decreased mobility, Decreased range of motion, Decreased skin integrity, Decreased strength, Increased edema,  Postural dysfunction, Prosthetic Dependency, Pain  Visit Diagnosis: Other abnormalities of gait and mobility  Unsteadiness on feet  Muscle weakness (generalized)  Stiffness of left knee, not elsewhere classified  Abnormal posture  Pain in left lower leg     Problem List Patient Active Problem List   Diagnosis Date Noted   S/P BKA (below knee amputation) unilateral, left ([place]) 10/03/2020   Non-healing wound of lower extremity 10/03/2020   History of amputation of left foot through metatarsal bone ([place]) 09/11/2020   PAD (peripheral artery disease) ([place]) 08/20/2020   Status post total replacement of left hip 06/05/2020   Unilateral primary osteoarthritis, right hip 04/17/2020   Unilateral primary osteoarthritis, left hip 04/17/2020   Amputated toe, right ([place]) 11/22/2018   Type 2 diabetes mellitus with foot ulcer, without long-term current use of insulin ([place]) 11/22/2018   Essential hypertension 11/22/2018   Mixed hyperlipidemia 11/22/2018   Morbid obesity ([place]) 11/22/2018   Aneurysm of left popliteal artery ([place]) 11/22/2018   Status post peripheral artery angioplasty with insertion of stent 11/22/2018   Encounter for smoking cessation counseling 11/22/2018   Osteomyelitis of third toe of right foot ([place])    Cellulitis of third toe of right foot     Jamey Reas, PT, DPT 06/23/2021, 8:53 AM  [facility] [address] [place], [place], [address] Phone: [phone]   Fax:  [phone]  Name: LYFE MONGER MRN: [id number] Date of Birth: 02-18-1961

## 2021-06-25 ENCOUNTER — Other Ambulatory Visit: Payer: Self-pay

## 2021-06-25 ENCOUNTER — Ambulatory Visit (INDEPENDENT_AMBULATORY_CARE_PROVIDER_SITE_OTHER): Payer: BC Managed Care – PPO | Admitting: Physical Therapy

## 2021-06-25 DIAGNOSIS — R2681 Unsteadiness on feet: Secondary | ICD-10-CM

## 2021-06-25 DIAGNOSIS — M6281 Muscle weakness (generalized): Secondary | ICD-10-CM | POA: Diagnosis not present

## 2021-06-25 DIAGNOSIS — M25662 Stiffness of left knee, not elsewhere classified: Secondary | ICD-10-CM

## 2021-06-25 DIAGNOSIS — M79662 Pain in left lower leg: Secondary | ICD-10-CM

## 2021-06-25 DIAGNOSIS — R2689 Other abnormalities of gait and mobility: Secondary | ICD-10-CM | POA: Diagnosis not present

## 2021-06-25 DIAGNOSIS — R293 Abnormal posture: Secondary | ICD-10-CM

## 2021-06-25 NOTE — Therapy (Signed)
[facility] Physical Therapy [address] [place], [place], [address] Phone: [phone]   Fax:  [phone]  Physical Therapy Treatment  Patient Details  Name: Anthony Park MRN: [id number] Date of Birth: Jan 15, 1961 Referring Provider (PT): Harold Barban, MD   Encounter Date: 06/25/2021   PT End of Session - 06/25/21 0851     Visit Number 19    Number of Visits 25    Date for PT Re-Evaluation 07/17/21    Authorization Type BCBS comm ppo    Authorization Time Period DED MET AND OOP MET, 120 Visits PT/OT/ST COMBINED,Remaining 116 Visits    Authorization - Visit Number 18    Authorization - Number of Visits 116    PT Start Time 0800    PT Stop Time 0845    PT Time Calculation (min) 45 min    Equipment Utilized During Treatment Gait belt    Activity Tolerance Patient tolerated treatment well;Patient limited by pain    Behavior During Therapy WFL for tasks assessed/performed             Past Medical History:  Diagnosis Date   Arthritis    Diabetes mellitus without complication ([place])    type 2 controlled with diet    Diabetic toe ulcer ([place]) 11/05/2018   History of kidney stones    passed 1 several years ago   Hyperlipidemia    Hypertension    not on medications   Peripheral vascular disease ([place])    stent in left leg due to anerysm     Past Surgical History:  Procedure Laterality Date    Left Transmetatarsal Ampuation (Left Foot)  08/20/2020   ABDOMINAL AORTOGRAM W/LOWER EXTREMITY N/A 08/06/2020   Procedure: ABDOMINAL AORTOGRAM W/LOWER EXTREMITY;  Surgeon: Marty Heck, MD;  Location: [place] [facility];  Service: Cardiovascular;  Laterality: N/A;   AMPUTATION Right 11/07/2018   Procedure: RIGHT 3RD TOE AMPUTATION;  Surgeon: Newt Minion, MD;  Location: [place];  Service: Orthopedics;  Laterality: Right;   AMPUTATION Left 08/20/2020   Procedure: Left Transmetatarsal Ampuation;  Surgeon: Marty Heck, MD;  Location: [place];   Service: Vascular;  Laterality: Left;   AMPUTATION Left 10/03/2020   Procedure: LEFT BELOW KNEE AMPUTATION;  Surgeon: Marty Heck, MD;  Location: [place];  Service: Vascular;  Laterality: Left;   AMPUTATION TOE Left    big toe and one next to it   CATARACT EXTRACTION Bilateral    EYE SURGERY Bilateral    cataracts removed   PERIPHERAL VASCULAR BALLOON ANGIOPLASTY Left 08/06/2020   Procedure: PERIPHERAL VASCULAR BALLOON ANGIOPLASTY;  Surgeon: Marty Heck, MD;  Location: [place] [facility];  Service: Cardiovascular;  Laterality: Left;  Peroneal artery.   TOE AMPUTATION Left 2015   TOTAL HIP ARTHROPLASTY Left 05/23/2020   Procedure: LEFT TOTAL HIP ARTHROPLASTY ANTERIOR APPROACH;  Surgeon: Mcarthur Rossetti, MD;  [facility];  Service: Orthopedics;  Laterality: Left;   WOUND DEBRIDEMENT Left 09/11/2020   Procedure: DEBRIDEMENT OF LEFT TRANSMETATARSAL WOUND;  Surgeon: Cherre Robins, MD;  Location: [place];  Service: Vascular;  Laterality: Left;    There were no vitals filed for this visit.   Subjective Assessment - 06/25/21 0834     Subjective relays his infection is improving, no more pus reported. He is able to weight bear with better tolerance today.    Pertinent History Left TTTA, left THA (05/23/20), arthritis, DM2, HTN, HLD, PVD    Patient Stated Goals Wants to  use prosthesis to return to some work, get back in community, be mobile, work in gym in his garage    Pain Onset More than a month ago    Pain Onset More than a month ago                               [facility] Adult PT Treatment/Exercise - 06/25/21 0001       Transfers   Transfers Stand Pivot Transfers    Sit to Stand 5: Supervision;With upper extremity assist;From chair/3-in-1;Other (comment)    Stand to Sit 5: Supervision;With upper extremity assist;To chair/3-in-1;Other (comment)    Stand Pivot Transfers 5: Supervision    Stand Pivot Transfer Details (indicate cue type and  reason) from wheelchair to leg press and back with UE support, then wheelchair to nu step and back with UE support      Ambulation/Gait   Ambulation/Gait Yes    Ambulation/Gait Assistance 5: Supervision    Ambulation/Gait Assistance Details in //bars with bilat UE support    Ambulation Distance (Feet) 30 Feet    Gait Comments ambulation still limited by pain due to infection in residual limb      Neuro Re-ed    Neuro Re-ed Details  balance in bars on foam pad 1 min X2, progressed to head rotations X10 lateral and up/down X10, on foam with eyes closed 10 sec X5. On foam with unilat rows, shoulder extensions, chest presses, curl to shoulder press X10 ea side green band      Knee/Hip Exercises: Stretches   Active Hamstring Stretch Both;30 seconds;1 rep    Active Hamstring Stretch Limitations standing in bars      Knee/Hip Exercises: Aerobic   Nustep Seat 12 level 7 with BUEs & BLEs for 8 min      Knee/Hip Exercises: Machines for Strengthening   Total Gym Leg Press Shuttle leg press 125# DL 2X15                       PT Short Term Goals - 06/02/21 1253       PT SHORT TERM GOAL #1   Title Patient verbalizes proper sweat managment with prosthesis.    Time 4    Period Weeks    Status On-going    Target Date 06/18/21      PT SHORT TERM GOAL #2   Title Patient tolerates prosthesis >80% of awake hrs /day without increase in skin issues or limb pain <4/10 after standing.    Time 4    Period Weeks    Status On-going    Target Date 06/18/21      PT SHORT TERM GOAL #3   Title Patient able to pick up items from floor & reach 10" without UE support safely.    Time 4    Period Weeks    Status On-going    Target Date 06/18/21      PT SHORT TERM GOAL #4   Title Patient ambulates 200' with cane & prosthesis with supervision.    Time 4    Period Weeks    Status On-going    Target Date 06/18/21      PT SHORT TERM GOAL #5   Title Patient negotiates ramps & curbs with  cane & prosthesis with minA.    Time 4    Period Weeks    Status On-going    Target Date 06/18/21  PT Long Term Goals - 05/19/21 0939       PT LONG TERM GOAL #1   Title Patient demonstrates & verbalized understanding of prosthetic care to enable safe utilization of prosthesis.    Time 12    Period Weeks    Status On-going    Target Date 07/17/21      PT LONG TERM GOAL #2   Title Patient tolerates prosthesis wear for >90% of awake hours without skin issues & limb pain </= 2/10.    Time 12    Period Weeks    Status On-going    Target Date 07/17/21      PT LONG TERM GOAL #3   Title Berg Balance >/= 36/56 to indicate lower fall risk    Time 12    Period Weeks    Status On-going    Target Date 07/17/21      PT LONG TERM GOAL #4   Title Patient ambulates 300' with LRAD & prosthesis modified independent    Time 12    Period Weeks    Status On-going    Target Date 07/17/21      PT LONG TERM GOAL #5   Title Patient negotiates ramps, curbs & stairs with LRAD & prosthesis modified independent.    Time 12    Period Weeks    Status On-going    Target Date 07/17/21                   Plan - 06/25/21 0853     Clinical Impression Statement he was able to tolerate standing balance exercises today but we limited his ambulation due to pain with this from injection in his residual limb, this does appear to be improving and healing well, he will follow up with MD about this next week. He had woke up with lots of swelling and did not need any ply socks but now it has come down drastically and he is using 12 ply socks. He shows good ability with transfers but does still need UE support with these. I did not check STG due to his pain limiting his weight bearing and ambulation tolerance.    Personal Factors and Comorbidities Comorbidity 3+;Fitness;Time since onset of injury/illness/exacerbation;Past/Current Experience    Comorbidities Left TTTA, left THA  (05/23/20), arthritis, DM2, HTN, HLD, PVD    Examination-Activity Limitations Locomotion Level;Squat;Stairs;Stand;Transfers    Examination-Participation Restrictions Community Activity;Occupation    Stability/Clinical Decision Making Evolving/Moderate complexity    Rehab Potential Good    PT Frequency 2x / week    PT Duration 12 weeks    PT Treatment/Interventions ADLs/Self Care Home Management;DME Instruction;Gait training;Stair training;Functional mobility training;Therapeutic activities;Therapeutic exercise;Balance training;Neuromuscular re-education;Patient/family education;Prosthetic Training;Vestibular;Dry needling;Manual techniques    PT Next Visit Plan assess wound on residual limb, if tolerated check STGs, prosthetic gait with cane including ramp & curb increasing distance as tolerated by right hip pain,  Balance activities, therapeutic exercise, no dry needling until infection clears & off antibiodic    PT Home Exercise Plan Access Code: [email or web address]  + sink HEP    Consulted and Agree with Plan of Care Patient             Patient will benefit from skilled therapeutic intervention in order to improve the following deficits and impairments:  Abnormal gait, Decreased activity tolerance, Decreased balance, Decreased endurance, Decreased knowledge of use of DME, Decreased mobility, Decreased range of motion, Decreased skin integrity, Decreased strength, Increased edema, Postural dysfunction, Prosthetic Dependency, Pain  Visit  Diagnosis: Other abnormalities of gait and mobility  Unsteadiness on feet  Muscle weakness (generalized)  Stiffness of left knee, not elsewhere classified  Abnormal posture  Pain in left lower leg     Problem List Patient Active Problem List   Diagnosis Date Noted   S/P BKA (below knee amputation) unilateral, left ([place]) 10/03/2020   Non-healing wound of lower extremity 10/03/2020   History of amputation of left foot through metatarsal bone ([place])  09/11/2020   PAD (peripheral artery disease) ([place]) 08/20/2020   Status post total replacement of left hip 06/05/2020   Unilateral primary osteoarthritis, right hip 04/17/2020   Unilateral primary osteoarthritis, left hip 04/17/2020   Amputated toe, right ([place]) 11/22/2018   Type 2 diabetes mellitus with foot ulcer, without long-term current use of insulin ([place]) 11/22/2018   Essential hypertension 11/22/2018   Mixed hyperlipidemia 11/22/2018   Morbid obesity ([place]) 11/22/2018   Aneurysm of left popliteal artery ([place]) 11/22/2018   Status post peripheral artery angioplasty with insertion of stent 11/22/2018   Encounter for smoking cessation counseling 11/22/2018   Osteomyelitis of third toe of right foot ([place])    Cellulitis of third toe of right foot     Debbe Odea, PT,DPT 06/25/2021, 8:56 AM  [facility] Physical [facility] [address] [place], [place], [address] Phone: [phone]   Fax:  [phone]  Name: Anthony Park MRN: [id number] Date of Birth: 02-Aug-1961

## 2021-06-29 ENCOUNTER — Other Ambulatory Visit: Payer: Self-pay

## 2021-06-30 ENCOUNTER — Ambulatory Visit (INDEPENDENT_AMBULATORY_CARE_PROVIDER_SITE_OTHER): Payer: BC Managed Care – PPO | Admitting: Physical Therapy

## 2021-06-30 ENCOUNTER — Encounter: Payer: Self-pay | Admitting: Physical Therapy

## 2021-06-30 ENCOUNTER — Ambulatory Visit (INDEPENDENT_AMBULATORY_CARE_PROVIDER_SITE_OTHER): Payer: BC Managed Care – PPO | Admitting: Orthopedic Surgery

## 2021-06-30 ENCOUNTER — Other Ambulatory Visit: Payer: Self-pay

## 2021-06-30 ENCOUNTER — Encounter: Payer: BC Managed Care – PPO | Admitting: Physical Therapy

## 2021-06-30 DIAGNOSIS — S88112A Complete traumatic amputation at level between knee and ankle, left lower leg, initial encounter: Secondary | ICD-10-CM | POA: Diagnosis not present

## 2021-06-30 DIAGNOSIS — M6281 Muscle weakness (generalized): Secondary | ICD-10-CM | POA: Diagnosis not present

## 2021-06-30 DIAGNOSIS — R2681 Unsteadiness on feet: Secondary | ICD-10-CM | POA: Diagnosis not present

## 2021-06-30 DIAGNOSIS — R2689 Other abnormalities of gait and mobility: Secondary | ICD-10-CM

## 2021-06-30 DIAGNOSIS — M25662 Stiffness of left knee, not elsewhere classified: Secondary | ICD-10-CM | POA: Diagnosis not present

## 2021-06-30 DIAGNOSIS — R293 Abnormal posture: Secondary | ICD-10-CM

## 2021-06-30 DIAGNOSIS — M79662 Pain in left lower leg: Secondary | ICD-10-CM

## 2021-06-30 MED ORDER — DOXYCYCLINE HYCLATE 100 MG PO TABS: 100.0000 mg | ORAL_TABLET | Freq: Two times a day (BID) | ORAL | 0 refills | Status: DC

## 2021-06-30 NOTE — Therapy (Signed)
[facility] Physical Therapy [address] [place], [place], [address] Phone: [phone]   Fax:  [phone]  Physical Therapy Treatment  Patient Details  Name: [name] MRN: [id number] Date of Birth: 10-Mar-1961 Referring Provider (PT): Harold Barban, MD   Encounter Date: 06/30/2021   PT End of Session - 06/30/21 1304     Visit Number 20    Number of Visits 25    Date for PT Re-Evaluation 07/17/21    Authorization Type BCBS comm ppo    Authorization Time Period Anthony MET AND OOP MET, 120 Visits PT/OT/ST COMBINED,Remaining 116 Visits    Authorization - Visit Number 20    Authorization - Number of Visits 116    PT Start Time 1300    PT Stop Time 1340    PT Time Calculation (min) 40 min    Equipment Utilized During Treatment Gait belt    Activity Tolerance Patient tolerated treatment well;Patient limited by pain    Behavior During Therapy WFL for tasks assessed/performed             Past Medical History:  Diagnosis Date   Arthritis    Diabetes mellitus without complication ([place])    type 2 controlled with diet    Diabetic toe ulcer ([place]) 11/05/2018   History of kidney stones    passed 1 several years ago   Hyperlipidemia    Hypertension    not on medications   Peripheral vascular disease ([place])    stent in left leg due to anerysm     Past Surgical History:  Procedure Laterality Date    Left Transmetatarsal Ampuation (Left Foot)  08/20/2020   ABDOMINAL AORTOGRAM W/LOWER EXTREMITY N/A 08/06/2020   Procedure: ABDOMINAL AORTOGRAM W/LOWER EXTREMITY;  Surgeon: Marty Heck, MD;  Location: [place] [facility];  Service: Cardiovascular;  Laterality: N/A;   AMPUTATION Right 11/07/2018   Procedure: RIGHT 3RD TOE AMPUTATION;  Surgeon: Newt Minion, MD;  Location: [place];  Service: Orthopedics;  Laterality: Right;   AMPUTATION Left 08/20/2020   Procedure: Left Transmetatarsal Ampuation;  Surgeon: Marty Heck, MD;  Location: [place];   Service: Vascular;  Laterality: Left;   AMPUTATION Left 10/03/2020   Procedure: LEFT BELOW KNEE AMPUTATION;  Surgeon: Marty Heck, MD;  Location: [place];  Service: Vascular;  Laterality: Left;   AMPUTATION TOE Left    big toe and one next to it   CATARACT EXTRACTION Bilateral    EYE SURGERY Bilateral    cataracts removed   PERIPHERAL VASCULAR BALLOON ANGIOPLASTY Left 08/06/2020   Procedure: PERIPHERAL VASCULAR BALLOON ANGIOPLASTY;  Surgeon: Marty Heck, MD;  Location: [place] [facility];  Service: Cardiovascular;  Laterality: Left;  Peroneal artery.   TOE AMPUTATION Left 2015   TOTAL HIP ARTHROPLASTY Left 05/23/2020   Procedure: LEFT TOTAL HIP ARTHROPLASTY ANTERIOR APPROACH;  Surgeon: Mcarthur Rossetti, MD;  Location: [facility];  Service: Orthopedics;  Laterality: Left;   WOUND DEBRIDEMENT Left 09/11/2020   Procedure: DEBRIDEMENT OF LEFT TRANSMETATARSAL WOUND;  Surgeon: Cherre Robins, MD;  Location: [place];  Service: Vascular;  Laterality: Left;    There were no vitals filed for this visit.   Subjective Assessment - 06/30/21 1300     Subjective His hip is better than last 2 visits.  His antibiodic ended today and sees Dr. Sharol Given after PT.    Pertinent History Left TTTA, left THA (05/23/20), arthritis, DM2, HTN, HLD, PVD    Patient Stated Goals Wants to  use prosthesis to return to some work, get back in community, be mobile, work in gym in his garage    Currently in Pain? Yes    Pain Score 2     Pain Location Hip    Pain Orientation Right    Pain Descriptors / Indicators Sore;Aching    Pain Type Chronic pain    Pain Onset More than a month ago    Pain Frequency Constant    Aggravating Factors  walking  & standing with arthritis    Pain Relieving Factors meds    Pain Onset More than a month ago                               [facility] Adult PT Treatment/Exercise - 06/30/21 1300       Transfers   Transfers Stand Pivot Transfers    Sit to Stand  5: Supervision;With upper extremity assist;From chair/3-in-1;Other (comment)    Stand to Sit 5: Supervision;With upper extremity assist;To chair/3-in-1;Other (comment)    Stand Pivot Transfers 5: Supervision      Ambulation/Gait   Ambulation/Gait Yes    Ambulation/Gait Assistance 3: Mod assist;4: Min assist   1st walk modA & 2nd walk minA   Ambulation/Gait Assistance Details greater support with HHA (pt reports due to right hip stiffness & weakness)    Ambulation Distance (Feet) 70 Feet   70' X 2   Assistive device Straight cane;1 person hand held assist;Prosthesis      Neuro Re-ed    Neuro Re-ed Details  balance in bars on foam beam 1 min X2, progressed to head rotations X10 lateral, diagonals and up/down X10, on foam with eyes closed 10 sec X5.      Knee/Hip Exercises: Stretches   Active Hamstring Stretch Both;30 seconds;1 rep    Active Hamstring Stretch Limitations standing in bars      Knee/Hip Exercises: Aerobic   Nustep Seat 12 level 7 with BUEs & BLEs for 5 min      Knee/Hip Exercises: Standing   Forward Step Up Both;1 set;Hand Hold: 2;Step Height: 6"   3 reps   Forward Step Up Limitations demo & verbal cues on technique    Step Down Both;1 set;Hand Hold: 2;Step Height: 6"   3 reps   Step Down Limitations demo & verbal cues on technique    Rocker Board 1 minute   ant/level/post & right/level/left   Rocker Board Limitations square w/2 pivot points, mirror & verbal cues for balance reactions      Prosthetics   Current prosthetic wear tolerance (days/week)  daily    Current prosthetic wear tolerance (#hours/day)  <50% of awake hours while infection heals    Current prosthetic weight-bearing tolerance (hours/day)  standing initially unbearable with intermittent standing 30 min.  at end of PT session after 30 min intermittent standing distal limb pain 5-6/10    Edema pitting    Residual limb condition  scab on wound at distal tibia. redness at distal tibia & distal limb that  disappates with pressure.                       PT Short Term Goals - 06/30/21 1341       PT SHORT TERM GOAL #1   Title Patient verbalizes proper sweat managment with prosthesis.    Time 4    Period Weeks    Status Achieved    Target  Date 06/18/21      PT SHORT TERM GOAL #2   Title Patient tolerates prosthesis >80% of awake hrs /day without increase in skin issues or limb pain <4/10 after standing.    Baseline Not Met due to infection in residual limb limiting weight bearing    Time 4    Period Weeks    Status Not Met    Target Date 06/18/21      PT SHORT TERM GOAL #3   Title Patient able to pick up items from floor & reach 10" without UE support safely.    Baseline Not Met due to infection in residual limb limiting weight bearing    Time 4    Period Weeks    Status Not Met    Target Date 06/18/21      PT SHORT TERM GOAL #4   Title Patient ambulates 200' with cane & prosthesis with supervision.    Baseline Not Met due to infection in residual limb limiting weight bearing    Time 4    Period Weeks    Status Not Met    Target Date 06/18/21      PT SHORT TERM GOAL #5   Title Patient negotiates ramps & curbs with cane & prosthesis with minA.    Baseline Not Met due to infection in residual limb limiting weight bearing    Time 4    Period Weeks    Status Not Met    Target Date 06/18/21               PT Long Term Goals - 05/19/21 0939       PT LONG TERM GOAL #1   Title Patient demonstrates & verbalized understanding of prosthetic care to enable safe utilization of prosthesis.    Time 12    Period Weeks    Status On-going    Target Date 07/17/21      PT LONG TERM GOAL #2   Title Patient tolerates prosthesis wear for >90% of awake hours without skin issues & limb pain </= 2/10.    Time 12    Period Weeks    Status On-going    Target Date 07/17/21      PT LONG TERM GOAL #3   Title Berg Balance >/= 36/56 to indicate lower fall risk    Time 12     Period Weeks    Status On-going    Target Date 07/17/21      PT LONG TERM GOAL #4   Title Patient ambulates 300' with LRAD & prosthesis modified independent    Time 12    Period Weeks    Status On-going    Target Date 07/17/21      PT LONG TERM GOAL #5   Title Patient negotiates ramps, curbs & stairs with LRAD & prosthesis modified independent.    Time 12    Period Weeks    Status On-going    Target Date 07/17/21                   Plan - 06/30/21 1300     Clinical Impression Statement Patient tolerated more weight on prosthesis today than last 10 days with infection in residual limb. Decrease in activity tolerance over those 10 days seems to have caused increased weakness & issues with right hip that is scheduled to be replaced in one month.    Personal Factors and Comorbidities Comorbidity 3+;Fitness;Time since onset of injury/illness/exacerbation;Past/Current Experience  Comorbidities Left TTTA, left THA (05/23/20), arthritis, DM2, HTN, HLD, PVD    Examination-Activity Limitations Locomotion Level;Squat;Stairs;Stand;Transfers    Examination-Participation Restrictions Community Activity;Occupation    Stability/Clinical Decision Making Evolving/Moderate complexity    Rehab Potential Good    PT Frequency 2x / week    PT Duration 12 weeks    PT Treatment/Interventions ADLs/Self Care Home Management;DME Instruction;Gait training;Stair training;Functional mobility training;Therapeutic activities;Therapeutic exercise;Balance training;Neuromuscular re-education;Patient/family education;Prosthetic Training;Vestibular;Dry needling;Manual techniques    PT Next Visit Plan check wound & Dr. Sharol Given note, progressive standing balance & functional exercises, work towards LTGs as tolerated with pain    PT Home Exercise Plan Access Code: [email or web address]  + sink HEP    Consulted and Agree with Plan of Care Patient             Patient will benefit from skilled therapeutic intervention in  order to improve the following deficits and impairments:  Abnormal gait, Decreased activity tolerance, Decreased balance, Decreased endurance, Decreased knowledge of use of DME, Decreased mobility, Decreased range of motion, Decreased skin integrity, Decreased strength, Increased edema, Postural dysfunction, Prosthetic Dependency, Pain  Visit Diagnosis: Other abnormalities of gait and mobility  Unsteadiness on feet  Muscle weakness (generalized)  Stiffness of left knee, not elsewhere classified  Abnormal posture  Pain in left lower leg     Problem List Patient Active Problem List   Diagnosis Date Noted   S/P BKA (below knee amputation) unilateral, left ([place]) 10/03/2020   Non-healing wound of lower extremity 10/03/2020   History of amputation of left foot through metatarsal bone ([place]) 09/11/2020   PAD (peripheral artery disease) ([place]) 08/20/2020   Status post total replacement of left hip 06/05/2020   Unilateral primary osteoarthritis, right hip 04/17/2020   Unilateral primary osteoarthritis, left hip 04/17/2020   Amputated toe, right ([place]) 11/22/2018   Type 2 diabetes mellitus with foot ulcer, without long-term current use of insulin ([place]) 11/22/2018   Essential hypertension 11/22/2018   Mixed hyperlipidemia 11/22/2018   Morbid obesity ([place]) 11/22/2018   Aneurysm of left popliteal artery ([place]) 11/22/2018   Status post peripheral artery angioplasty with insertion of stent 11/22/2018   Encounter for smoking cessation counseling 11/22/2018   Osteomyelitis of third toe of right foot ([place])    Cellulitis of third toe of right foot     Jamey Reas, PT, DPT 06/30/2021, 1:45 PM  [facility] Physical Therapy [address] [place], [place], [address] Phone: [phone]   Fax:  [phone]  Name: Anthony Park MRN: [id number] Date of Birth: December 28, 1960

## 2021-07-02 ENCOUNTER — Other Ambulatory Visit: Payer: Self-pay

## 2021-07-02 ENCOUNTER — Ambulatory Visit (INDEPENDENT_AMBULATORY_CARE_PROVIDER_SITE_OTHER): Payer: BC Managed Care – PPO | Admitting: Physical Therapy

## 2021-07-02 ENCOUNTER — Encounter: Payer: Self-pay | Admitting: Physical Therapy

## 2021-07-02 DIAGNOSIS — R2689 Other abnormalities of gait and mobility: Secondary | ICD-10-CM

## 2021-07-02 DIAGNOSIS — R2681 Unsteadiness on feet: Secondary | ICD-10-CM | POA: Diagnosis not present

## 2021-07-02 DIAGNOSIS — M6281 Muscle weakness (generalized): Secondary | ICD-10-CM

## 2021-07-02 DIAGNOSIS — R293 Abnormal posture: Secondary | ICD-10-CM

## 2021-07-02 DIAGNOSIS — M25662 Stiffness of left knee, not elsewhere classified: Secondary | ICD-10-CM | POA: Diagnosis not present

## 2021-07-02 NOTE — Therapy (Signed)
[facility] Physical Therapy [address] [place], [place], [address] Phone: [phone]   Fax:  [phone]  Physical Therapy Treatment  Patient Details  Name: Anthony Park MRN: [id number] Date of Birth: 1961-02-26 Referring Provider (PT): Coral Else, MD   Encounter Date: 07/02/2021   PT End of Session - 07/02/21 0807     Visit Number 21    Number of Visits 25    Date for PT Re-Evaluation 07/17/21    Authorization Type BCBS comm ppo    Authorization Time Period DED MET AND OOP MET, 120 Visits PT/OT/ST COMBINED,Remaining 116 Visits    Authorization - Visit Number 21    Authorization - Number of Visits 116    PT Start Time 0801    PT Stop Time 0845    PT Time Calculation (min) 44 min    Equipment Utilized During Treatment Gait belt    Activity Tolerance Patient tolerated treatment well;Patient limited by pain    Behavior During Therapy WFL for tasks assessed/performed             Past Medical History:  Diagnosis Date   Arthritis    Diabetes mellitus without complication ([facility])    type 2 controlled with diet    Diabetic toe ulcer ([facility]) 11/05/2018   History of kidney stones    passed 1 several years ago   Hyperlipidemia    Hypertension    not on medications   Peripheral vascular disease ([facility])    stent in left leg due to anerysm     Past Surgical History:  Procedure Laterality Date    Left Transmetatarsal Ampuation (Left Foot)  08/20/2020   ABDOMINAL AORTOGRAM W/LOWER EXTREMITY N/A 08/06/2020   Procedure: ABDOMINAL AORTOGRAM W/LOWER EXTREMITY;  Surgeon: [name], MD;  [facility]: [facility];  Service: Cardiovascular;  Laterality: N/A;   AMPUTATION Right 11/07/2018   Procedure: RIGHT 3RD TOE AMPUTATION;  Surgeon: Nadara Mustard, MD;  Location: [facility];  Service: Orthopedics;  Laterality: Right;   AMPUTATION Left 08/20/2020   Procedure: Left Transmetatarsal Ampuation;  Surgeon: [name], MD;  Location: [facility];   Service: Vascular;  Laterality: Left;   AMPUTATION Left 10/03/2020   Procedure: LEFT BELOW KNEE AMPUTATION;  Surgeon: [name], MD;  Location: [facility];  Service: Vascular;  Laterality: Left;   AMPUTATION TOE Left    big toe and one next to it   CATARACT EXTRACTION Bilateral    EYE SURGERY Bilateral    cataracts removed   PERIPHERAL VASCULAR BALLOON ANGIOPLASTY Left 08/06/2020   Procedure: PERIPHERAL VASCULAR BALLOON ANGIOPLASTY;  Surgeon: [name], MD;  [facility]: [facility];  Service: Cardiovascular;  Laterality: Left;  Peroneal artery.   TOE AMPUTATION Left 2015   TOTAL HIP ARTHROPLASTY Left 05/23/2020   Procedure: LEFT TOTAL HIP ARTHROPLASTY ANTERIOR APPROACH;  Surgeon: Kathryne Hitch, MD;  [facility]: [facility];  Service: Orthopedics;  Laterality: Left;   WOUND DEBRIDEMENT Left 09/11/2020   Procedure: DEBRIDEMENT OF LEFT TRANSMETATARSAL WOUND;  Surgeon: Leonie Douglas, MD;  Location: [facility];  Service: Vascular;  Laterality: Left;    There were no vitals filed for this visit.   Subjective Assessment - 07/02/21 0801     Subjective Dr. Lajoyce Corners thinks there is ~10% chance infection is in bone which would require revision shaving of tibia.  He prescribed longer anitbiodic (30 days). Plans return in 2 weeks which will assess if can procede with hip surgery 11/18.  Pertinent History Left TTTA, left THA (05/23/20), arthritis, DM2, HTN, HLD, PVD    Patient Stated Goals Wants to use prosthesis to return to some work, get back in community, be mobile, work in gym in his garage    Currently in Pain? Yes    Pain Score 4     Pain Location Hip    Pain Orientation Right    Pain Descriptors / Indicators Aching;Sore    Pain Type Chronic pain    Pain Onset More than a month ago    Pain Frequency Constant    Aggravating Factors  walking & standing with arthritis    Pain Relieving Factors meds    Effect of Pain on Daily Activities sleeping & walking distance     Multiple Pain Sites No    Pain Location Leg   distal limb   Pain Onset More than a month ago                               [facility] Adult PT Treatment/Exercise - 07/02/21 0801       Transfers   Transfers Stand Pivot Transfers    Sit to Stand 5: Supervision;With upper extremity assist;From chair/3-in-1;Other (comment)    Stand to Sit 5: Supervision;With upper extremity assist;To chair/3-in-1;Other (comment)    Stand Pivot Transfers --      Ambulation/Gait   Ambulation/Gait Yes    Ambulation/Gait Assistance 5: Supervision    Ambulation/Gait Assistance Details verbal & demo cues on proper step width to decrease lateral wt shift for stance.  Pt decreased for 8" base to 4" base with improved gait motion & less hip pain.    Ambulation Distance (Feet) 70 Feet   70' X 2   Assistive device Straight cane;Prosthesis   arrives & exits with rollator   Ambulation Surface Level;Indoor      Neuro Re-ed    Neuro Re-ed Details  --      Knee/Hip Exercises: Stretches   Active Hamstring Stretch Both;30 seconds;1 rep    Active Hamstring Stretch Limitations standing in bars    Other Knee/Hip Stretches Quadratus Lumborum stretch seated 2 reps & Yoga child's pose 3 reps 30 sec hold. Added to HEP Pt verbalized & return demo understanding.      Knee/Hip Exercises: Aerobic   Nustep Seat 12 level 7 with BUEs & BLEs for 6 min      Knee/Hip Exercises: Machines for Strengthening   Total Gym Leg Press Shuttle leg press 125# DL 2X15 2nd set back flat to change hip position      Knee/Hip Exercises: Standing   Hip Abduction Stengthening;Both;1 set;10 reps;Knee straight   alternating LEs, standing on foam, light UE support //bars   Abduction Limitations cues for posture & form    Hip Extension Stengthening;Both;1 set;10 reps;Knee straight   alternating LEs, standing on foam, light UE support //bars   Extension Limitations cues for posture & form    Rocker Board 1 minute   ant/level/post &  right/level/left   Rocker Board Limitations square w/1 pivot points, mirror & verbal cues for balance reactions      Prosthetics   Prosthetic Care Comments  consider seeing prosthetist for additional pads    Current prosthetic wear tolerance (days/week)  daily    Current prosthetic wear tolerance (#hours/day)  <50% of awake hours while infection heals    Current prosthetic weight-bearing tolerance (hours/day)  standing initially unbearable with intermittent standing 30  min.  at end of PT session after 30 min intermittent standing distal limb pain 5-6/10    Edema pitting    Residual limb condition  scab on wound at distal tibia. redness at distal tibia & distal limb that disappates with pressure.                       PT Short Term Goals - 06/30/21 1341       PT SHORT TERM GOAL #1   Title Patient verbalizes proper sweat managment with prosthesis.    Time 4    Period Weeks    Status Achieved    Target Date 06/18/21      PT SHORT TERM GOAL #2   Title Patient tolerates prosthesis >80% of awake hrs /day without increase in skin issues or limb pain <4/10 after standing.    Baseline Not Met due to infection in residual limb limiting weight bearing    Time 4    Period Weeks    Status Not Met    Target Date 06/18/21      PT SHORT TERM GOAL #3   Title Patient able to pick up items from floor & reach 10" without UE support safely.    Baseline Not Met due to infection in residual limb limiting weight bearing    Time 4    Period Weeks    Status Not Met    Target Date 06/18/21      PT SHORT TERM GOAL #4   Title Patient ambulates 200' with cane & prosthesis with supervision.    Baseline Not Met due to infection in residual limb limiting weight bearing    Time 4    Period Weeks    Status Not Met    Target Date 06/18/21      PT SHORT TERM GOAL #5   Title Patient negotiates ramps & curbs with cane & prosthesis with minA.    Baseline Not Met due to infection in residual  limb limiting weight bearing    Time 4    Period Weeks    Status Not Met    Target Date 06/18/21               PT Long Term Goals - 05/19/21 0939       PT LONG TERM GOAL #1   Title Patient demonstrates & verbalized understanding of prosthetic care to enable safe utilization of prosthesis.    Time 12    Period Weeks    Status On-going    Target Date 07/17/21      PT LONG TERM GOAL #2   Title Patient tolerates prosthesis wear for >90% of awake hours without skin issues & limb pain </= 2/10.    Time 12    Period Weeks    Status On-going    Target Date 07/17/21      PT LONG TERM GOAL #3   Title Berg Balance >/= 36/56 to indicate lower fall risk    Time 12    Period Weeks    Status On-going    Target Date 07/17/21      PT LONG TERM GOAL #4   Title Patient ambulates 300' with LRAD & prosthesis modified independent    Time 12    Period Weeks    Status On-going    Target Date 07/17/21      PT LONG TERM GOAL #5   Title Patient negotiates ramps, curbs & stairs with LRAD &  prosthesis modified independent.    Time 12    Period Weeks    Status On-going    Target Date 07/17/21                   Plan - 07/02/21 0808     Clinical Impression Statement Patient's hip was stiff initially but improved with exercises.  PT cued on step width which improved his gait.    Personal Factors and Comorbidities Comorbidity 3+;Fitness;Time since onset of injury/illness/exacerbation;Past/Current Experience    Comorbidities Left TTTA, left THA (05/23/20), arthritis, DM2, HTN, HLD, PVD    Examination-Activity Limitations Locomotion Level;Squat;Stairs;Stand;Transfers    Examination-Participation Restrictions Community Activity;Occupation    Stability/Clinical Decision Making Evolving/Moderate complexity    Rehab Potential Good    PT Frequency 2x / week    PT Duration 12 weeks    PT Treatment/Interventions ADLs/Self Care Home Management;DME Instruction;Gait training;Stair  training;Functional mobility training;Therapeutic activities;Therapeutic exercise;Balance training;Neuromuscular re-education;Patient/family education;Prosthetic Training;Vestibular;Dry needling;Manual techniques    PT Next Visit Plan progressive standing balance & functional exercises, work towards LTGs as tolerated with pain    PT Home Exercise Plan Access Code: [email or web address]  + sink HEP    Consulted and Agree with Plan of Care Patient             Patient will benefit from skilled therapeutic intervention in order to improve the following deficits and impairments:  Abnormal gait, Decreased activity tolerance, Decreased balance, Decreased endurance, Decreased knowledge of use of DME, Decreased mobility, Decreased range of motion, Decreased skin integrity, Decreased strength, Increased edema, Postural dysfunction, Prosthetic Dependency, Pain  Visit Diagnosis: Other abnormalities of gait and mobility  Unsteadiness on feet  Muscle weakness (generalized)  Stiffness of left knee, not elsewhere classified  Abnormal posture     Problem List Patient Active Problem List   Diagnosis Date Noted   S/P BKA (below knee amputation) unilateral, left ([place]) 10/03/2020   Non-healing wound of lower extremity 10/03/2020   History of amputation of left foot through metatarsal bone ([place]) 09/11/2020   PAD (peripheral artery disease) ([place]) 08/20/2020   Status post total replacement of left hip 06/05/2020   Unilateral primary osteoarthritis, right hip 04/17/2020   Unilateral primary osteoarthritis, left hip 04/17/2020   Amputated toe, right ([place]) 11/22/2018   Type 2 diabetes mellitus with foot ulcer, without long-term current use of insulin ([place]) 11/22/2018   Essential hypertension 11/22/2018   Mixed hyperlipidemia 11/22/2018   Morbid obesity ([place]) 11/22/2018   Aneurysm of left popliteal artery ([place]) 11/22/2018   Status post peripheral artery angioplasty with insertion of stent 11/22/2018   Encounter  for smoking cessation counseling 11/22/2018   Osteomyelitis of third toe of right foot ([place])    Cellulitis of third toe of right foot     Jamey Reas, PT, DPT 07/02/2021, 9:45 AM  [facility] Physical Therapy [address] [place], [place], [address] Phone: [phone]   Fax:  [phone]  Name: Anthony Park MRN: [id number] Date of Birth: 1960-11-23

## 2021-07-04 ENCOUNTER — Encounter: Payer: Self-pay | Admitting: Orthopedic Surgery

## 2021-07-04 NOTE — Progress Notes (Signed)
Office Visit Note   Patient: Anthony Park           Date of Birth: February 25, 1961           MRN: [id number] Visit Date: 06/30/2021              Requested by: Melida Quitter, MD [address] [place],  [place] [address] PCP: Melida Quitter, MD  Chief Complaint  Patient presents with   Left Leg - Wound Check, Follow-up      HPI: Patient is a 60 year old gentleman who presents in follow-up status post left transtibial amputation.  Patient recently has had an infection was on doxycycline.  Assessment & Plan: Visit Diagnoses:  1. Below-knee amputation of left lower extremity (HCC)     Plan: Patient is given a refill prescription for doxycycline reevaluate in 2 weeks.  Follow-Up Instructions: Return in about 2 weeks (around 07/14/2021).   Ortho Exam  Patient is alert, oriented, no adenopathy, well-dressed, normal affect, normal respiratory effort. Examination the cellulitis is significantly improved in the left leg.  His leg is much less tender to palpation and less pain with wearing his leg.  There is a area of cellulitis 2 cm over the tip of the transtibial amputation.  Imaging: No results found. No images are attached to the encounter.  Labs: Lab Results  Component Value Date   HGBA1C 6.2 (H) 10/03/2020   HGBA1C 6.8 (H) 05/20/2020   HGBA1C 6.7 (H) 11/05/2018   CRP 3.8 (H) 10/03/2020   CRP 53.7 (H) 09/23/2020   LABURIC 6.9 07/29/2020   REPTSTATUS 09/16/2020 FINAL 09/11/2020   GRAMSTAIN  09/11/2020    RARE WBC PRESENT, PREDOMINANTLY MONONUCLEAR MODERATE GRAM NEGATIVE RODS FEW GRAM VARIABLE ROD    CULT  09/11/2020    MODERATE PSEUDOMONAS AERUGINOSA RARE ESCHERICHIA COLI NO ANAEROBES ISOLATED Performed at [facility], [address], [place], [place] [address]    LABORGA PSEUDOMONAS AERUGINOSA 09/11/2020   LABORGA ESCHERICHIA COLI 09/11/2020     Lab Results  Component Value Date   ALBUMIN 3.4 (L) 10/03/2020   ALBUMIN 3.6 09/11/2020   ALBUMIN 3.5  11/06/2018    Lab Results  Component Value Date   MG 1.9 08/24/2020   MG 1.6 (L) 08/23/2020   No results found for: VD25OH  No results found for: PREALBUMIN CBC EXTENDED Latest Ref Rng & Units 10/06/2020 10/05/2020 10/04/2020  WBC 4.0 - 10.5 K/uL 7.4 8.0 11.1(H)  RBC 4.22 - 5.81 MIL/uL 2.83(L) 2.80(L) 2.87(L)  HGB 13.0 - 17.0 g/dL 8.1(L) 8.1(L) 8.5(L)  HCT 39.0 - 52.0 % 26.1(L) 25.4(L) 25.7(L)  PLT 150 - 400 K/uL 254 262 273  NEUTROABS 1.7 - 7.7 K/uL - - -  LYMPHSABS 0.7 - 4.0 K/uL - - -     There is no height or weight on file to calculate BMI.  Orders:  No orders of the defined types were placed in this encounter.  Meds ordered this encounter  Medications   doxycycline (VIBRA-TABS) 100 MG tablet    Sig: Take 1 tablet (100 mg total) by mouth 2 (two) times daily.    Dispense:  60 tablet    Refill:  0     Procedures: No procedures performed  Clinical Data: No additional findings.  ROS:  All other systems negative, except as noted in the HPI. Review of Systems  Objective: Vital Signs: There were no vitals taken for this visit.  Specialty Comments:  No specialty comments available.  PMFS History:  Patient Active Problem List   Diagnosis Date Noted   S/P BKA (below knee amputation) unilateral, left (HCC) 10/03/2020   Non-healing wound of lower extremity 10/03/2020   History of amputation of left foot through metatarsal bone (HCC) 09/11/2020   PAD (peripheral artery disease) (HCC) 08/20/2020   Status post total replacement of left hip 06/05/2020   Unilateral primary osteoarthritis, right hip 04/17/2020   Unilateral primary osteoarthritis, left hip 04/17/2020   Amputated toe, right (HCC) 11/22/2018   Type 2 diabetes mellitus with foot ulcer, without long-term current use of insulin (HCC) 11/22/2018   Essential hypertension 11/22/2018   Mixed hyperlipidemia 11/22/2018   Morbid obesity (HCC) 11/22/2018   Aneurysm of left popliteal artery (HCC) 11/22/2018    Status post peripheral artery angioplasty with insertion of stent 11/22/2018   Encounter for smoking cessation counseling 11/22/2018   Osteomyelitis of third toe of right foot (HCC)    Cellulitis of third toe of right foot    Past Medical History:  Diagnosis Date   Arthritis    Diabetes mellitus without complication (HCC)    type 2 controlled with diet    Diabetic toe ulcer (HCC) 11/05/2018   History of kidney stones    passed 1 several years ago   Hyperlipidemia    Hypertension    not on medications   Peripheral vascular disease (HCC)    stent in left leg due to anerysm     Family History  Problem Relation Age of Onset   COPD Mother    Cataracts Mother    Brain cancer Father    Hypertension Brother     Past Surgical History:  Procedure Laterality Date    Left Transmetatarsal Ampuation (Left Foot)  08/20/2020   ABDOMINAL AORTOGRAM W/LOWER EXTREMITY N/A 08/06/2020   Procedure: ABDOMINAL AORTOGRAM W/LOWER EXTREMITY;  Surgeon: [name], MD;  Location: [facility];  Service: Cardiovascular;  Laterality: N/A;   AMPUTATION Right 11/07/2018   Procedure: RIGHT 3RD TOE AMPUTATION;  Surgeon: Nadara Mustard, MD;  Location: [facility];  Service: Orthopedics;  Laterality: Right;   AMPUTATION Left 08/20/2020   Procedure: Left Transmetatarsal Ampuation;  Surgeon: [name], MD;  Location: [facility];  Service: Vascular;  Laterality: Left;   AMPUTATION Left 10/03/2020   Procedure: LEFT BELOW KNEE AMPUTATION;  Surgeon: [name], MD;  Location: [facility] OR;  Service: Vascular;  Laterality: Left;   AMPUTATION TOE Left    big toe and one next to it   CATARACT EXTRACTION Bilateral    EYE SURGERY Bilateral    cataracts removed   PERIPHERAL VASCULAR BALLOON ANGIOPLASTY Left 08/06/2020   Procedure: PERIPHERAL VASCULAR BALLOON ANGIOPLASTY;  Surgeon: [name], MD;  Location: [facility];  Service: Cardiovascular;  Laterality: Left;  Peroneal artery.   TOE  AMPUTATION Left 2015   TOTAL HIP ARTHROPLASTY Left 05/23/2020   Procedure: LEFT TOTAL HIP ARTHROPLASTY ANTERIOR APPROACH;  Surgeon: Kathryne Hitch, MD;  Location: [facility];  Service: Orthopedics;  Laterality: Left;   WOUND DEBRIDEMENT Left 09/11/2020   Procedure: DEBRIDEMENT OF LEFT TRANSMETATARSAL WOUND;  Surgeon: Leonie Douglas, MD;  Location: [facility] OR;  Service: Vascular;  Laterality: Left;   Social History   Occupational History   Not on file  Tobacco Use   Smoking status: Former    Packs/day: 0.50    Years: 41.00    Pack years: 20.50    Types: Cigarettes    Quit date: 07/20/2020  Years since quitting: 0.9   Smokeless tobacco: Never  Vaping Use   Vaping Use: Never used  Substance and Sexual Activity   Alcohol use: Yes    Alcohol/week: 14.0 standard drinks    Types: 14 Standard drinks or equivalent per week   Drug use: Not Currently    Comment: hx of marijuana 15 years ago    Sexual activity: Yes

## 2021-07-07 ENCOUNTER — Other Ambulatory Visit: Payer: Self-pay

## 2021-07-07 ENCOUNTER — Encounter: Payer: Self-pay | Admitting: Physical Therapy

## 2021-07-07 ENCOUNTER — Ambulatory Visit (INDEPENDENT_AMBULATORY_CARE_PROVIDER_SITE_OTHER): Payer: BC Managed Care – PPO | Admitting: Physical Therapy

## 2021-07-07 DIAGNOSIS — M25662 Stiffness of left knee, not elsewhere classified: Secondary | ICD-10-CM | POA: Diagnosis not present

## 2021-07-07 DIAGNOSIS — R2689 Other abnormalities of gait and mobility: Secondary | ICD-10-CM

## 2021-07-07 DIAGNOSIS — R2681 Unsteadiness on feet: Secondary | ICD-10-CM

## 2021-07-07 DIAGNOSIS — M6281 Muscle weakness (generalized): Secondary | ICD-10-CM | POA: Diagnosis not present

## 2021-07-07 DIAGNOSIS — M79662 Pain in left lower leg: Secondary | ICD-10-CM

## 2021-07-07 DIAGNOSIS — R293 Abnormal posture: Secondary | ICD-10-CM

## 2021-07-07 NOTE — Therapy (Addendum)
[facility] [address] [place], [place], [address] Phone: [phone]   Fax:  [phone]  Physical Therapy Treatment  Patient Details  Name: Anthony Park MRN: [id number] Date of Birth: 1960-12-09 Referring Provider (PT): Harold Barban, MD   Encounter Date: 07/07/2021   PT End of Session - 07/07/21 0758     Visit Number 22    Number of Visits 25    Date for PT Re-Evaluation 07/17/21    Authorization Type [employer] comm ppo    Authorization Time Period DED MET AND OOP MET, 120 Visits PT/OT/ST COMBINED,Remaining 116 Visits    Authorization - Visit Number 96    Authorization - Number of Visits 116    PT Start Time 0758    PT Stop Time 0845    PT Time Calculation (min) 47 min    Equipment Utilized During Treatment Gait belt    Activity Tolerance Patient tolerated treatment well;Patient limited by pain    Behavior During Therapy WFL for tasks assessed/performed             Past Medical History:  Diagnosis Date   Arthritis    Diabetes mellitus without complication ([place])    type 2 controlled with diet    Diabetic toe ulcer ([place]) 11/05/2018   History of kidney stones    passed 1 several years ago   Hyperlipidemia    Hypertension    not on medications   Peripheral vascular disease ([place])    stent in left leg due to anerysm     Past Surgical History:  Procedure Laterality Date    Left Transmetatarsal Ampuation (Left Foot)  08/20/2020   ABDOMINAL AORTOGRAM W/LOWER EXTREMITY N/A 08/06/2020   Procedure: ABDOMINAL AORTOGRAM W/LOWER EXTREMITY;  Surgeon: Marty Heck, MD;  Location: [place] [facility];  Service: Cardiovascular;  Laterality: N/A;   AMPUTATION Right 11/07/2018   Procedure: RIGHT 3RD TOE AMPUTATION;  Surgeon: Newt Minion, MD;  Location: [place];  Service: Orthopedics;  Laterality: Right;   AMPUTATION Left 08/20/2020   Procedure: Left Transmetatarsal Ampuation;  Surgeon: Marty Heck, MD;  Location: [place];   Service: Vascular;  Laterality: Left;   AMPUTATION Left 10/03/2020   Procedure: LEFT BELOW KNEE AMPUTATION;  Surgeon: Marty Heck, MD;  Location: [place];  Service: Vascular;  Laterality: Left;   AMPUTATION TOE Left    big toe and one next to it   CATARACT EXTRACTION Bilateral    EYE SURGERY Bilateral    cataracts removed   PERIPHERAL VASCULAR BALLOON ANGIOPLASTY Left 08/06/2020   Procedure: PERIPHERAL VASCULAR BALLOON ANGIOPLASTY;  Surgeon: Marty Heck, MD;  Location: [place] [facility];  Service: Cardiovascular;  Laterality: Left;  Peroneal artery.   TOE AMPUTATION Left 2015   TOTAL HIP ARTHROPLASTY Left 05/23/2020   Procedure: LEFT TOTAL HIP ARTHROPLASTY ANTERIOR APPROACH;  Surgeon: Mcarthur Rossetti, MD;  Location: [facility];  Service: Orthopedics;  Laterality: Left;   WOUND DEBRIDEMENT Left 09/11/2020   Procedure: DEBRIDEMENT OF LEFT TRANSMETATARSAL WOUND;  Surgeon: Cherre Robins, MD;  Location: [place];  Service: Vascular;  Laterality: Left;    There were no vitals filed for this visit.   Subjective Assessment - 07/07/21 0759     Subjective His right hip continues to limit motions. He continues to take antibiodic. His limb is no longer burning or hurting. It feels the best it has since he got the prosthesis.    Pertinent History Left TTTA, left THA (05/23/20), arthritis,  DM2, HTN, HLD, PVD    Patient Stated Goals Wants to use prosthesis to return to some work, get back in community, be mobile, work in gym in his garage    Currently in Pain? Yes    Pain Score 4    range over last week 0/10 - 8-9/10   Pain Location Hip    Pain Orientation Right    Pain Descriptors / Indicators Aching;Sore    Pain Type Chronic pain    Pain Onset More than a month ago    Pain Frequency Intermittent    Aggravating Factors  walking & standing with arthritis    Pain Relieving Factors meds    Effect of Pain on Daily Activities sleeping & walking    Multiple Pain Sites No    Pain  Location Leg   residual limb   Pain Orientation Right    Pain Onset More than a month ago                               [facility] Adult PT Treatment/Exercise - 07/07/21 0759       Transfers   Transfers Stand Pivot Transfers    Sit to Stand 5: Supervision;With upper extremity assist;From chair/3-in-1;Other (comment)    Stand to Sit 5: Supervision;With upper extremity assist;To chair/3-in-1;Other (comment)      Ambulation/Gait   Ambulation/Gait Yes    Ambulation/Gait Assistance 5: Supervision    Ambulation Distance (Feet) 70 Feet   70' X 2   Assistive device Straight cane;Prosthesis   arrives & exits with rollator   Ambulation Surface Level;Indoor      Knee/Hip Exercises: Diplomatic Services operational officer Both;30 seconds;1 rep    Active Hamstring Stretch Limitations standing in bars    Other Knee/Hip Stretches Quadratus Lumborum stretch seated 2 reps & Yoga child's pose 3 reps 30 sec hold. Added to HEP Pt verbalized & return demo understanding.      Knee/Hip Exercises: Aerobic   Nustep Seat 12 level 7 with BUEs & BLEs for 8 min      Knee/Hip Exercises: Machines for Strengthening   Total Gym Leg Press Shuttle leg press 125# DL 2X15 2nd set back flat to change hip position      Knee/Hip Exercises: Standing   Hip Abduction Stengthening;Both;1 set;10 reps;Knee straight   alternating LEs, standing on foam, light UE support //bars   Abduction Limitations cues for posture & form    Hip Extension Stengthening;Both;1 set;10 reps;Knee straight   alternating LEs, standing on foam, light UE support //bars   Extension Limitations cues for posture & form    Rocker Board 1 minute   ant/level/post & right/level/left   Rocker Board Limitations square w/1 pivot points, mirror & verbal cues for balance reactions      Manual Therapy   Manual Therapy Soft tissue mobilization;Myofascial release    Manual therapy comments compresson and skilled Palpaiton with DN    Soft tissue  mobilization soft tissue mobilizations with compression to right glut & piriformis.    Myofascial Release glut med & max trigger point release in sidelying    Muscle [employer] Technique contract relax for right hip flexor stretch      Prosthetics   Prosthetic Care Comments  reviewed recommendation to consider seeing prosthetist for additional pads    Current prosthetic wear tolerance (days/week)  daily    Current prosthetic wear tolerance (#hours/day)  most of awake hours  Current prosthetic weight-bearing tolerance (hours/day)  no limb pain with weight bearing today    Edema pitting    Residual limb condition  pt reports no issues    Education Provided Other (comment)   see prosthetic care comments   Person(s) Educated Patient    Education Method Explanation;Verbal cues    Education Method Verbalized understanding    Donning Prosthesis Modified independent (device/increased time)              Trigger Point Dry Needling - 07/07/21 0830     Consent Given? Yes    Education Handout Provided Previously provided    Muscles Treated Back/Hip Gluteus medius;Gluteus maximus    Dry Needling Comments without E-stim    Piriformis Response Twitch response elicited            Dry Needling performed by Scot Jun, PT, DPT         PT Short Term Goals - 06/30/21 1341       PT SHORT TERM GOAL #1   Title Patient verbalizes proper sweat managment with prosthesis.    Time 4    Period Weeks    Status Achieved    Target Date 06/18/21      PT SHORT TERM GOAL #2   Title Patient tolerates prosthesis >80% of awake hrs /day without increase in skin issues or limb pain <4/10 after standing.    Baseline Not Met due to infection in residual limb limiting weight bearing    Time 4    Period Weeks    Status Not Met    Target Date 06/18/21      PT SHORT TERM GOAL #3   Title Patient able to pick up items from floor & reach 10" without UE support safely.    Baseline Not Met due to  infection in residual limb limiting weight bearing    Time 4    Period Weeks    Status Not Met    Target Date 06/18/21      PT SHORT TERM GOAL #4   Title Patient ambulates 200' with cane & prosthesis with supervision.    Baseline Not Met due to infection in residual limb limiting weight bearing    Time 4    Period Weeks    Status Not Met    Target Date 06/18/21      PT SHORT TERM GOAL #5   Title Patient negotiates ramps & curbs with cane & prosthesis with minA.    Baseline Not Met due to infection in residual limb limiting weight bearing    Time 4    Period Weeks    Status Not Met    Target Date 06/18/21               PT Long Term Goals - 05/19/21 0939       PT LONG TERM GOAL #1   Title Patient demonstrates & verbalized understanding of prosthetic care to enable safe utilization of prosthesis.    Time 12    Period Weeks    Status On-going    Target Date 07/17/21      PT LONG TERM GOAL #2   Title Patient tolerates prosthesis wear for >90% of awake hours without skin issues & limb pain </= 2/10.    Time 12    Period Weeks    Status On-going    Target Date 07/17/21      PT LONG TERM GOAL #3   Title Berg Balance >/= 36/56 to  indicate lower fall risk    Time 12    Period Weeks    Status On-going    Target Date 07/17/21      PT LONG TERM GOAL #4   Title Patient ambulates 300' with LRAD & prosthesis modified independent    Time 12    Period Weeks    Status On-going    Target Date 07/17/21      PT LONG TERM GOAL #5   Title Patient negotiates ramps, curbs & stairs with LRAD & prosthesis modified independent.    Time 12    Period Weeks    Status On-going    Target Date 07/17/21                   Plan - 07/07/21 0758     Clinical Impression Statement Patient reports dry needling keeps right hip pain more managable prior to surgery.  Based on pt report of limb pain, it appears that the infection is clear or close to clear.  Patient's right hip  strength & pain limit his mobility.  Strengthening & flexibility prior to surgery should help with quicker recovery.    Personal Factors and Comorbidities Comorbidity 3+;Fitness;Time since onset of injury/illness/exacerbation;Past/Current Experience    Comorbidities Left TTTA, left THA (05/23/20), arthritis, DM2, HTN, HLD, PVD    Examination-Activity Limitations Locomotion Level;Squat;Stairs;Stand;Transfers    Examination-Participation Restrictions Community Activity;Occupation    Stability/Clinical Decision Making Evolving/Moderate complexity    Rehab Potential Good    PT Frequency 2x / week    PT Duration 12 weeks    PT Treatment/Interventions ADLs/Self Care Home Management;DME Instruction;Gait training;Stair training;Functional mobility training;Therapeutic activities;Therapeutic exercise;Balance training;Neuromuscular re-education;Patient/family education;Prosthetic Training;Vestibular;Dry needling;Manual techniques    PT Next Visit Plan continue progressive standing balance & functional exercises, work towards LTGs as tolerated with pain, dry needling as indicated.    PT Home Exercise Plan Access Code: [email or web address]  + sink HEP    Consulted and Agree with Plan of Care Patient             Patient will benefit from skilled therapeutic intervention in order to improve the following deficits and impairments:  Abnormal gait, Decreased activity tolerance, Decreased balance, Decreased endurance, Decreased knowledge of use of DME, Decreased mobility, Decreased range of motion, Decreased skin integrity, Decreased strength, Increased edema, Postural dysfunction, Prosthetic Dependency, Pain  Visit Diagnosis: Other abnormalities of gait and mobility  Unsteadiness on feet  Muscle weakness (generalized)  Stiffness of left knee, not elsewhere classified  Abnormal posture  Pain in left lower leg     Problem List Patient Active Problem List   Diagnosis Date Noted   S/P BKA (below knee  amputation) unilateral, left ([place]) 10/03/2020   Non-healing wound of lower extremity 10/03/2020   History of amputation of left foot through metatarsal bone ([place]) 09/11/2020   PAD (peripheral artery disease) ([place]) 08/20/2020   Status post total replacement of left hip 06/05/2020   Unilateral primary osteoarthritis, right hip 04/17/2020   Unilateral primary osteoarthritis, left hip 04/17/2020   Amputated toe, right ([place]) 11/22/2018   Type 2 diabetes mellitus with foot ulcer, without long-term current use of insulin ([place]) 11/22/2018   Essential hypertension 11/22/2018   Mixed hyperlipidemia 11/22/2018   Morbid obesity ([place]) 11/22/2018   Aneurysm of left popliteal artery ([place]) 11/22/2018   Status post peripheral artery angioplasty with insertion of stent 11/22/2018   Encounter for smoking cessation counseling 11/22/2018   Osteomyelitis of third toe of right foot ([place])  Cellulitis of third toe of right foot     Jamey Reas, PT, DPT 07/07/2021, 8:46 AM  I attest to performing dry needling with visit today.   Scot Jun, PT, DPT, OCS, ATC 07/07/21  9:26 AM    [facility] [address] [place], [place], [address] Phone: [phone]   Fax:  [phone]  Name: Anthony Park MRN: [id number] Date of Birth: 01-03-1961

## 2021-07-09 ENCOUNTER — Ambulatory Visit (INDEPENDENT_AMBULATORY_CARE_PROVIDER_SITE_OTHER): Payer: BC Managed Care – PPO | Admitting: Physical Therapy

## 2021-07-09 ENCOUNTER — Other Ambulatory Visit: Payer: Self-pay

## 2021-07-09 ENCOUNTER — Encounter: Payer: Self-pay | Admitting: Physical Therapy

## 2021-07-09 DIAGNOSIS — R293 Abnormal posture: Secondary | ICD-10-CM

## 2021-07-09 DIAGNOSIS — M25662 Stiffness of left knee, not elsewhere classified: Secondary | ICD-10-CM

## 2021-07-09 DIAGNOSIS — M6281 Muscle weakness (generalized): Secondary | ICD-10-CM

## 2021-07-09 DIAGNOSIS — R2681 Unsteadiness on feet: Secondary | ICD-10-CM

## 2021-07-09 DIAGNOSIS — M79662 Pain in left lower leg: Secondary | ICD-10-CM

## 2021-07-09 DIAGNOSIS — R2689 Other abnormalities of gait and mobility: Secondary | ICD-10-CM

## 2021-07-09 NOTE — Therapy (Signed)
[facility] [address] [place], [place], [address] Phone: [phone]   Fax:  [phone]  Physical Therapy Treatment  Patient Details  Name: Anthony Park MRN: [id number] Date of Birth: 1960-10-31 Referring Provider (PT): Harold Barban, MD   Encounter Date: 07/09/2021   PT End of Session - 07/09/21 0759     Visit Number 23    Number of Visits 25    Date for PT Re-Evaluation 07/17/21    Authorization Type [employer] comm ppo    Authorization Time Period DED MET AND OOP MET, 120 Visits PT/OT/ST COMBINED,Remaining 116 Visits    Authorization - Visit Number 23    Authorization - Number of Visits 116    PT Start Time 5573    PT Stop Time 0846    PT Time Calculation (min) 47 min    Equipment Utilized During Treatment Gait belt    Activity Tolerance Patient tolerated treatment well;Patient limited by pain    Behavior During Therapy WFL for tasks assessed/performed             Past Medical History:  Diagnosis Date   Arthritis    Diabetes mellitus without complication ([place])    type 2 controlled with diet    Diabetic toe ulcer ([place]) 11/05/2018   History of kidney stones    passed 1 several years ago   Hyperlipidemia    Hypertension    not on medications   Peripheral vascular disease ([place])    stent in left leg due to anerysm     Past Surgical History:  Procedure Laterality Date    Left Transmetatarsal Ampuation (Left Foot)  08/20/2020   ABDOMINAL AORTOGRAM W/LOWER EXTREMITY N/A 08/06/2020   Procedure: ABDOMINAL AORTOGRAM W/LOWER EXTREMITY;  Surgeon: Marty Heck, MD;  Location: [place] [facility];  Service: Cardiovascular;  Laterality: N/A;   AMPUTATION Right 11/07/2018   Procedure: RIGHT 3RD TOE AMPUTATION;  Surgeon: Newt Minion, MD;  Location: [place];  Service: Orthopedics;  Laterality: Right;   AMPUTATION Left 08/20/2020   Procedure: Left Transmetatarsal Ampuation;  Surgeon: Marty Heck, MD;  Location: [place];   Service: Vascular;  Laterality: Left;   AMPUTATION Left 10/03/2020   Procedure: LEFT BELOW KNEE AMPUTATION;  Surgeon: Marty Heck, MD;  Location: [place];  Service: Vascular;  Laterality: Left;   AMPUTATION TOE Left    big toe and one next to it   CATARACT EXTRACTION Bilateral    EYE SURGERY Bilateral    cataracts removed   PERIPHERAL VASCULAR BALLOON ANGIOPLASTY Left 08/06/2020   Procedure: PERIPHERAL VASCULAR BALLOON ANGIOPLASTY;  Surgeon: Marty Heck, MD;  Location: [place] [facility];  Service: Cardiovascular;  Laterality: Left;  Peroneal artery.   TOE AMPUTATION Left 2015   TOTAL HIP ARTHROPLASTY Left 05/23/2020   Procedure: LEFT TOTAL HIP ARTHROPLASTY ANTERIOR APPROACH;  Surgeon: Mcarthur Rossetti, MD;  Location: [facility];  Service: Orthopedics;  Laterality: Left;   WOUND DEBRIDEMENT Left 09/11/2020   Procedure: DEBRIDEMENT OF LEFT TRANSMETATARSAL WOUND;  Surgeon: Cherre Robins, MD;  Location: [place];  Service: Vascular;  Laterality: Left;    There were no vitals filed for this visit.   Subjective Assessment - 07/09/21 0800     Subjective His right hip seems to be getting worse.    Pertinent History Left TTTA, left THA (05/23/20), arthritis, DM2, HTN, HLD, PVD    Patient Stated Goals Wants to use prosthesis to return to some work, get back in community,  be mobile, work in gym in his garage    Currently in Pain? Yes    Pain Score 6     Pain Location Hip    Pain Orientation Right    Pain Descriptors / Indicators Aching;Sore    Pain Type Chronic pain    Pain Onset More than a month ago    Pain Frequency Intermittent    Aggravating Factors  arthritis, walking & standing,    Pain Relieving Factors meds    Effect of Pain on Daily Activities sleeping & walking    Pain Onset More than a month ago                               [facility] Adult PT Treatment/Exercise - 07/09/21 0800       Transfers   Transfers Stand Pivot Transfers    Sit to  Stand 5: Supervision;With upper extremity assist;From chair/3-in-1;Other (comment)    Stand to Sit 5: Supervision;With upper extremity assist;To chair/3-in-1;Other (comment)      Ambulation/Gait   Ambulation/Gait Yes    Ambulation/Gait Assistance 5: Supervision    Ambulation Distance (Feet) 70 Feet   70' X 2   Assistive device Straight cane;Prosthesis   arrives & exits with rollator     High Level Balance   High Level Balance Activities Side stepping;Turns   in //bars   High Level Balance Comments verbal cues      Knee/Hip Exercises: Stretches   Active Hamstring Stretch Both;30 seconds;1 rep    Active Hamstring Stretch Limitations supine SLR w/ strap    Other Knee/Hip Stretches Quadratus Lumborum stretch seated 2 reps & Yoga child's pose 3 reps 30 sec hold. Added to HEP Pt verbalized & return demo understanding.      Knee/Hip Exercises: Aerobic   Nustep Seat 12 level 8 with BUEs & BLEs for 8 min      Knee/Hip Exercises: Machines for Strengthening   Total Gym Leg Press Shuttle leg press 125# DL 2X15 1st set back 45* position 3 & 2nd set back flat to change hip position 2      Knee/Hip Exercises: Standing   Hip Abduction --    Abduction Limitations --    Hip Extension --    Extension Limitations --    Rocker Board 1 minute   ant/level/post & right/level/left   Rocker Board Limitations square w/1 pivot points, mirror & verbal cues for balance reactions    Other Standing Knee Exercises alternating LEs flex/abd/ext with foot returning to bilateral stance between direction changes, standing on foam, light UE support //bars, 10 reps      Manual Therapy   Manual Therapy Soft tissue mobilization;Myofascial release    Manual therapy comments compresson and skilled Palpaiton with DN    Soft tissue mobilization soft tissue mobilizations with compression to right glut & piriformis.    Myofascial Release glut med & max trigger point release in sidelying    Muscle Energy Technique contract  relax for right hip flexor stretch      Prosthetics   Prosthetic Care Comments  PT used internet and discussed socket revision with possibility of suction sleeve suspension.  PT cued on burning discomfot may be pin alignment causing friction for liner and repositioning pin away area of pain / discomfort.    Current prosthetic wear tolerance (days/week)  daily    Current prosthetic wear tolerance (#hours/day)  most of awake hours  Current prosthetic weight-bearing tolerance (hours/day)  no limb pain with weight bearing today    Edema pitting    Residual limb condition  pt reports no issues    Education Provided Other (comment)   see prosthetic care comments   Person(s) Educated Patient    Education Method Explanation;Verbal cues    Education Method Verbalized understanding    Donning Prosthesis Modified independent (device/increased time)                       PT Short Term Goals - 06/30/21 1341       PT SHORT TERM GOAL #1   Title Patient verbalizes proper sweat managment with prosthesis.    Time 4    Period Weeks    Status Achieved    Target Date 06/18/21      PT SHORT TERM GOAL #2   Title Patient tolerates prosthesis >80% of awake hrs /day without increase in skin issues or limb pain <4/10 after standing.    Baseline Not Met due to infection in residual limb limiting weight bearing    Time 4    Period Weeks    Status Not Met    Target Date 06/18/21      PT SHORT TERM GOAL #3   Title Patient able to pick up items from floor & reach 10" without UE support safely.    Baseline Not Met due to infection in residual limb limiting weight bearing    Time 4    Period Weeks    Status Not Met    Target Date 06/18/21      PT SHORT TERM GOAL #4   Title Patient ambulates 200' with cane & prosthesis with supervision.    Baseline Not Met due to infection in residual limb limiting weight bearing    Time 4    Period Weeks    Status Not Met    Target Date 06/18/21       PT SHORT TERM GOAL #5   Title Patient negotiates ramps & curbs with cane & prosthesis with minA.    Baseline Not Met due to infection in residual limb limiting weight bearing    Time 4    Period Weeks    Status Not Met    Target Date 06/18/21               PT Long Term Goals - 05/19/21 0939       PT LONG TERM GOAL #1   Title Patient demonstrates & verbalized understanding of prosthetic care to enable safe utilization of prosthesis.    Time 12    Period Weeks    Status On-going    Target Date 07/17/21      PT LONG TERM GOAL #2   Title Patient tolerates prosthesis wear for >90% of awake hours without skin issues & limb pain </= 2/10.    Time 12    Period Weeks    Status On-going    Target Date 07/17/21      PT LONG TERM GOAL #3   Title Berg Balance >/= 36/56 to indicate lower fall risk    Time 12    Period Weeks    Status On-going    Target Date 07/17/21      PT LONG TERM GOAL #4   Title Patient ambulates 300' with LRAD & prosthesis modified independent    Time 12    Period Weeks    Status On-going  Target Date 07/17/21      PT LONG TERM GOAL #5   Title Patient negotiates ramps, curbs & stairs with LRAD & prosthesis modified independent.    Time 12    Period Weeks    Status On-going    Target Date 07/17/21                   Plan - 07/09/21 0759     Clinical Impression Statement Patient's gait was slow & cautious today due to hip pain but able to use cane without HHA.  Pt in agreement with discharge next week. He will probably need PT after his hip surgery currently scheduled on 11/18.    Personal Factors and Comorbidities Comorbidity 3+;Fitness;Time since onset of injury/illness/exacerbation;Past/Current Experience    Comorbidities Left TTTA, left THA (05/23/20), arthritis, DM2, HTN, HLD, PVD    Examination-Activity Limitations Locomotion Level;Squat;Stairs;Stand;Transfers    Examination-Participation Restrictions Community Activity;Occupation     Stability/Clinical Decision Making Evolving/Moderate complexity    Rehab Potential Good    PT Frequency 2x / week    PT Duration 12 weeks    PT Treatment/Interventions ADLs/Self Care Home Management;DME Instruction;Gait training;Stair training;Functional mobility training;Therapeutic activities;Therapeutic exercise;Balance training;Neuromuscular re-education;Patient/family education;Prosthetic Training;Vestibular;Dry needling;Manual techniques    PT Next Visit Plan beging to assess LTGs, standing balance & functional exercises as tolerated with hip pain.  dry needling as indicated.    PT Home Exercise Plan Access Code: [email or web address]  + sink HEP    Consulted and Agree with Plan of Care Patient             Patient will benefit from skilled therapeutic intervention in order to improve the following deficits and impairments:  Abnormal gait, Decreased activity tolerance, Decreased balance, Decreased endurance, Decreased knowledge of use of DME, Decreased mobility, Decreased range of motion, Decreased skin integrity, Decreased strength, Increased edema, Postural dysfunction, Prosthetic Dependency, Pain  Visit Diagnosis: Other abnormalities of gait and mobility  Unsteadiness on feet  Muscle weakness (generalized)  Stiffness of left knee, not elsewhere classified  Abnormal posture  Pain in left lower leg     Problem List Patient Active Problem List   Diagnosis Date Noted   S/P BKA (below knee amputation) unilateral, left ([place]) 10/03/2020   Non-healing wound of lower extremity 10/03/2020   History of amputation of left foot through metatarsal bone ([place]) 09/11/2020   PAD (peripheral artery disease) ([place]) 08/20/2020   Status post total replacement of left hip 06/05/2020   Unilateral primary osteoarthritis, right hip 04/17/2020   Unilateral primary osteoarthritis, left hip 04/17/2020   Amputated toe, right ([place]) 11/22/2018   Type 2 diabetes mellitus with foot ulcer, without long-term  current use of insulin ([place]) 11/22/2018   Essential hypertension 11/22/2018   Mixed hyperlipidemia 11/22/2018   Morbid obesity ([place]) 11/22/2018   Aneurysm of left popliteal artery ([place]) 11/22/2018   Status post peripheral artery angioplasty with insertion of stent 11/22/2018   Encounter for smoking cessation counseling 11/22/2018   Osteomyelitis of third toe of right foot ([place])    Cellulitis of third toe of right foot     Jamey Reas, PT, DPT 07/09/2021, 8:51 AM  [facility] [address] [place], [place], [address] Phone: [phone]   Fax:  [phone]  Name: BLU LORI MRN: [id number] Date of Birth: [birth year]

## 2021-07-13 ENCOUNTER — Ambulatory Visit (INDEPENDENT_AMBULATORY_CARE_PROVIDER_SITE_OTHER): Payer: BC Managed Care – PPO | Admitting: Physical Therapy

## 2021-07-13 ENCOUNTER — Encounter: Payer: Self-pay | Admitting: Physical Therapy

## 2021-07-13 ENCOUNTER — Ambulatory Visit (INDEPENDENT_AMBULATORY_CARE_PROVIDER_SITE_OTHER): Payer: BC Managed Care – PPO | Admitting: Orthopedic Surgery

## 2021-07-13 ENCOUNTER — Encounter: Payer: Self-pay | Admitting: Orthopedic Surgery

## 2021-07-13 ENCOUNTER — Other Ambulatory Visit: Payer: Self-pay

## 2021-07-13 DIAGNOSIS — R2689 Other abnormalities of gait and mobility: Secondary | ICD-10-CM | POA: Diagnosis not present

## 2021-07-13 DIAGNOSIS — S88112A Complete traumatic amputation at level between knee and ankle, left lower leg, initial encounter: Secondary | ICD-10-CM

## 2021-07-13 DIAGNOSIS — M25662 Stiffness of left knee, not elsewhere classified: Secondary | ICD-10-CM | POA: Diagnosis not present

## 2021-07-13 DIAGNOSIS — M6281 Muscle weakness (generalized): Secondary | ICD-10-CM | POA: Diagnosis not present

## 2021-07-13 DIAGNOSIS — R2681 Unsteadiness on feet: Secondary | ICD-10-CM

## 2021-07-13 NOTE — Progress Notes (Signed)
Office Visit Note   Patient: Anthony Park           Date of Birth: July 11, 1961           MRN: [id number] Visit Date: 07/13/2021              Requested by: Michael Boston, MD [address] [place],  [place] [address] PCP: Michael Boston, MD  Chief Complaint  Patient presents with   Left Leg - Wound Check    S/p Left BKA      HPI: Patient is a 60 year old gentleman who is seen in follow-up for left transtibial amputation.  Patient has had progressive loss of volume of the residual limb he has had modifications made to the socket he has been wearing increased ply socks and also wearing a underline her sock but still has some end bearing pressure.  Patient is scheduled for total hip arthroplasty with Dr. Ninfa Linden.  Patient will complete his course of doxycycline.  Assessment & Plan: Visit Diagnoses:  1. Below-knee amputation of left lower extremity ([place])     Plan: Patient is working with therapy for gait training.  Discussed that we may need to consider a suction socket to help patient with his prosthetic fitting.  Anticipate we can get a few more months out of his current socket.  Follow-Up Instructions: Return in about 3 months (around 10/13/2021).   Ortho Exam  Patient is alert, oriented, no adenopathy, well-dressed, normal affect, normal respiratory effort. On examination there is some end bearing redness after patient woke with therapy this morning there is no cellulitis no drainage no tenderness to palpation.  Imaging: No results found. No images are attached to the encounter.  Labs: Lab Results  Component Value Date   HGBA1C 6.2 (H) 10/03/2020   HGBA1C 6.8 (H) 05/20/2020   HGBA1C 6.7 (H) 11/05/2018   CRP 3.8 (H) 10/03/2020   CRP 53.7 (H) 09/23/2020   LABURIC 6.9 07/29/2020   REPTSTATUS 09/16/2020 FINAL 09/11/2020   GRAMSTAIN  09/11/2020    RARE WBC PRESENT, PREDOMINANTLY MONONUCLEAR MODERATE GRAM NEGATIVE RODS FEW GRAM VARIABLE ROD    CULT  09/11/2020     MODERATE PSEUDOMONAS AERUGINOSA RARE ESCHERICHIA COLI NO ANAEROBES ISOLATED Performed at [facility], [place] [address], [place], [place] [address]    LABORGA PSEUDOMONAS AERUGINOSA 09/11/2020   LABORGA ESCHERICHIA COLI 09/11/2020     Lab Results  Component Value Date   ALBUMIN 3.4 (L) 10/03/2020   ALBUMIN 3.6 09/11/2020   ALBUMIN 3.5 11/06/2018    Lab Results  Component Value Date   MG 1.9 08/24/2020   MG 1.6 (L) 08/23/2020   No results found for: VD25OH  No results found for: PREALBUMIN CBC EXTENDED Latest Ref Rng & Units 10/06/2020 10/05/2020 10/04/2020  WBC 4.0 - 10.5 K/uL 7.4 8.0 11.1(H)  RBC 4.22 - 5.81 MIL/uL 2.83(L) 2.80(L) 2.87(L)  HGB 13.0 - 17.0 g/dL 8.1(L) 8.1(L) 8.5(L)  HCT 39.0 - 52.0 % 26.1(L) 25.4(L) 25.7(L)  PLT 150 - 400 K/uL 254 262 273  NEUTROABS 1.7 - 7.7 K/uL - - -  LYMPHSABS 0.7 - 4.0 K/uL - - -     There is no height or weight on file to calculate BMI.  Orders:  No orders of the defined types were placed in this encounter.  No orders of the defined types were placed in this encounter.    Procedures: No procedures performed  Clinical Data: No additional findings.  ROS:  All other  systems negative, except as noted in the HPI. Review of Systems  Objective: Vital Signs: There were no vitals taken for this visit.  Specialty Comments:  No specialty comments available.  PMFS History: Patient Active Problem List   Diagnosis Date Noted   S/P BKA (below knee amputation) unilateral, left ([facility]) 10/03/2020   Non-healing wound of lower extremity 10/03/2020   History of amputation of left foot through metatarsal bone ([facility]) 09/11/2020   PAD (peripheral artery disease) ([facility]) 08/20/2020   Status post total replacement of left hip 06/05/2020   Unilateral primary osteoarthritis, right hip 04/17/2020   Unilateral primary osteoarthritis, left hip 04/17/2020   Amputated toe, right ([facility]) 11/22/2018   Type 2 diabetes mellitus with foot ulcer,  without long-term current use of insulin ([facility]) 11/22/2018   Essential hypertension 11/22/2018   Mixed hyperlipidemia 11/22/2018   Morbid obesity ([facility]) 11/22/2018   Aneurysm of left popliteal artery ([facility]) 11/22/2018   Status post peripheral artery angioplasty with insertion of stent 11/22/2018   Encounter for smoking cessation counseling 11/22/2018   Osteomyelitis of third toe of right foot ([facility])    Cellulitis of third toe of right foot    Past Medical History:  Diagnosis Date   Arthritis    Diabetes mellitus without complication ([facility])    type 2 controlled with diet    Diabetic toe ulcer ([facility]) 11/05/2018   History of kidney stones    passed 1 several years ago   Hyperlipidemia    Hypertension    not on medications   Peripheral vascular disease ([facility])    stent in left leg due to anerysm     Family History  Problem Relation Age of Onset   COPD Mother    Cataracts Mother    Brain cancer Father    Hypertension Brother     Past Surgical History:  Procedure Laterality Date    Left Transmetatarsal Ampuation (Left Foot)  08/20/2020   ABDOMINAL AORTOGRAM W/LOWER EXTREMITY N/A 08/06/2020   Procedure: ABDOMINAL AORTOGRAM W/LOWER EXTREMITY;  Surgeon: [name], MD;  [facility];  Service: Cardiovascular;  Laterality: N/A;   AMPUTATION Right 11/07/2018   Procedure: RIGHT 3RD TOE AMPUTATION;  Surgeon: Nadara Mustard, MD;  Location: [facility];  Service: Orthopedics;  Laterality: Right;   AMPUTATION Left 08/20/2020   Procedure: Left Transmetatarsal Ampuation;  Surgeon: [name], MD;  Location: [facility];  Service: Vascular;  Laterality: Left;   AMPUTATION Left 10/03/2020   Procedure: LEFT BELOW KNEE AMPUTATION;  Surgeon: [name], MD;  Location: [facility];  Service: Vascular;  Laterality: Left;   AMPUTATION TOE Left    big toe and one next to it   CATARACT EXTRACTION Bilateral    EYE SURGERY Bilateral    cataracts removed   PERIPHERAL VASCULAR BALLOON  ANGIOPLASTY Left 08/06/2020   Procedure: PERIPHERAL VASCULAR BALLOON ANGIOPLASTY;  Surgeon: [name], MD;  Location: [facility];  Service: Cardiovascular;  Laterality: Left;  Peroneal artery.   TOE AMPUTATION Left 2015   TOTAL HIP ARTHROPLASTY Left 05/23/2020   Procedure: LEFT TOTAL HIP ARTHROPLASTY ANTERIOR APPROACH;  Surgeon: Kathryne Hitch, MD;  Location: [facility];  Service: Orthopedics;  Laterality: Left;   WOUND DEBRIDEMENT Left 09/11/2020   Procedure: DEBRIDEMENT OF LEFT TRANSMETATARSAL WOUND;  Surgeon: Leonie Douglas, MD;  Location: [facility];  Service: Vascular;  Laterality: Left;   Social History   Occupational History   Not on file  Tobacco Use  Smoking status: Former    Packs/day: 0.50    Years: 41.00    Pack years: 20.50    Types: Cigarettes    Quit date: 07/20/2020    Years since quitting: 0.9   Smokeless tobacco: Never  Vaping Use   Vaping Use: Never used  Substance and Sexual Activity   Alcohol use: Yes    Alcohol/week: 14.0 standard drinks    Types: 14 Standard drinks or equivalent per week   Drug use: Not Currently    Comment: hx of marijuana 15 years ago    Sexual activity: Yes

## 2021-07-13 NOTE — Therapy (Signed)
[facility] [address] [place], [place], [address] Phone: [phone]   Fax:  [phone]  Physical Therapy Treatment  Patient Details  Name: Anthony Park MRN: [id number] Date of Birth: 08-27-1961 Referring Provider (PT): Harold Barban, MD   Encounter Date: 07/13/2021   PT End of Session - 07/13/21 0805     Visit Number 24    Number of Visits 25    Date for PT Re-Evaluation 07/17/21    Authorization Type [employer] comm ppo    Authorization Time Period DED MET AND OOP MET, 120 Visits PT/OT/ST COMBINED,Remaining 116 Visits    Authorization - Visit Number 24    Authorization - Number of Visits 116    PT Start Time 0800    PT Stop Time 0845    PT Time Calculation (min) 45 min    Equipment Utilized During Treatment Gait belt    Activity Tolerance Patient tolerated treatment well;Patient limited by pain    Behavior During Therapy WFL for tasks assessed/performed             Past Medical History:  Diagnosis Date   Arthritis    Diabetes mellitus without complication ([place])    type 2 controlled with diet    Diabetic toe ulcer ([place]) 11/05/2018   History of kidney stones    passed 1 several years ago   Hyperlipidemia    Hypertension    not on medications   Peripheral vascular disease ([place])    stent in left leg due to anerysm     Past Surgical History:  Procedure Laterality Date    Left Transmetatarsal Ampuation (Left Foot)  08/20/2020   ABDOMINAL AORTOGRAM W/LOWER EXTREMITY N/A 08/06/2020   Procedure: ABDOMINAL AORTOGRAM W/LOWER EXTREMITY;  Surgeon: Marty Heck, MD;  Location: [facility];  Service: Cardiovascular;  Laterality: N/A;   AMPUTATION Right 11/07/2018   Procedure: RIGHT 3RD TOE AMPUTATION;  Surgeon: Newt Minion, MD;  Location: [facility];  Service: Orthopedics;  Laterality: Right;   AMPUTATION Left 08/20/2020   Procedure: Left Transmetatarsal Ampuation;  Surgeon: Marty Heck, MD;  Location: [facility];   Service: Vascular;  Laterality: Left;   AMPUTATION Left 10/03/2020   Procedure: LEFT BELOW KNEE AMPUTATION;  Surgeon: Marty Heck, MD;  Location: [facility];  Service: Vascular;  Laterality: Left;   AMPUTATION TOE Left    big toe and one next to it   CATARACT EXTRACTION Bilateral    EYE SURGERY Bilateral    cataracts removed   PERIPHERAL VASCULAR BALLOON ANGIOPLASTY Left 08/06/2020   Procedure: PERIPHERAL VASCULAR BALLOON ANGIOPLASTY;  Surgeon: Marty Heck, MD;  Location: [facility];  Service: Cardiovascular;  Laterality: Left;  Peroneal artery.   TOE AMPUTATION Left 2015   TOTAL HIP ARTHROPLASTY Left 05/23/2020   Procedure: LEFT TOTAL HIP ARTHROPLASTY ANTERIOR APPROACH;  Surgeon: Mcarthur Rossetti, MD;  Location: [facility];  Service: Orthopedics;  Laterality: Left;   WOUND DEBRIDEMENT Left 09/11/2020   Procedure: DEBRIDEMENT OF LEFT TRANSMETATARSAL WOUND;  Surgeon: Cherre [place], MD;  Location: [facility] [facility];  Service: Vascular;  Laterality: Left;    There were no vitals filed for this visit.   Subjective Assessment - 07/13/21 0801     Subjective His right hip was not as bad until last night. So he did not sleep well last night.    Pertinent History Left TTTA, left THA (05/23/20), arthritis, DM2, HTN, HLD, PVD    Patient Stated Goals Wants to use  prosthesis to return to some work, get back in community, be mobile, work in gym in his garage    Currently in Pain? Yes    Pain Score 5    ranged from 0/10 - 10/10   Pain Location Hip    Pain Orientation Right    Pain Descriptors / Indicators Aching;Sore    Pain Type Chronic pain    Pain Onset More than a month ago    Aggravating Factors  arthritis,    Pain Relieving Factors meds, stretching    Effect of Pain on Daily Activities sleeping & walking    Multiple Pain Sites No    Pain Score 0    Pain Location Leg   residual limb   Pain Orientation Left    Pain Onset More than a month ago                [facility] PT  Assessment - 07/13/21 0800       Assessment   Medical Diagnosis Left Transtibial Amputaion    Referring Provider (PT) Harold Barban, MD    Onset Date/Surgical Date 04/16/21   prosthesis delivery     Berg Balance Test   Sit to Stand Able to stand  independently using hands    Standing Unsupported Able to stand safely 2 minutes    Sitting with Back Unsupported but Feet Supported on Floor or Stool Able to sit safely and securely 2 minutes    Stand to Sit Controls descent by using hands    Transfers Able to transfer safely, definite need of hands    Standing Unsupported with Eyes Closed Able to stand 10 seconds safely    Standing Unsupported with Feet Together Able to place feet together independently and stand 1 minute safely    From Standing, Reach Forward with Outstretched Arm Can reach confidently >25 cm (10")    From Standing Position, Pick up Object from Floor Able to pick up shoe safely and easily    From Standing Position, Turn to Look Behind Over each Shoulder Looks behind one side only/other side shows less weight shift    Turn 360 Degrees Needs close supervision or verbal cueing    Standing Unsupported, Alternately Place Feet on Step/Stool Needs assistance to keep from falling or unable to try    Standing Unsupported, One Foot in [place] to take small step independently and hold 30 seconds    Standing on One Leg Unable to try or needs assist to prevent fall    Total Score 39    Berg comment: at eval on 04/21/21 was 17/56                           [facility] Adult PT Treatment/Exercise - 07/13/21 0800       Transfers   Transfers Stand Pivot Transfers    Sit to Stand 5: Supervision;With upper extremity assist;From chair/3-in-1;Other (comment)    Stand to Sit 5: Supervision;With upper extremity assist;To chair/3-in-1;Other (comment)      Ambulation/Gait   Ambulation/Gait Yes    Ambulation/Gait Assistance 5: Supervision    Ambulation Distance (Feet) 70 Feet   70'  X 2   Assistive device Straight cane;Prosthesis   arrives & exits with rollator     High Level Balance   High Level Balance Activities Side stepping;Turns   in //bars   High Level Balance Comments verbal cues      Knee/Hip Exercises: Stretches   Active  Hamstring Stretch --    Active Hamstring Stretch Limitations --    Other Knee/Hip Stretches --      Knee/Hip Exercises: Aerobic   Nustep Seat 12 level 8 with BUEs & BLEs for 8 min      Knee/Hip Exercises: Machines for Strengthening   Total Gym Leg Press Shuttle leg press 125# DL 2X15 1st set back 45* position 3 & 2nd set back flat to change hip position 2      Knee/Hip Exercises: Standing   Rocker Board --    Rocker Board Limitations --    Other Standing Knee Exercises alternating LEs flex/abd/ext with foot returning to bilateral stance between direction changes, standing on foam, light UE support //bars, 10 reps      Manual Therapy   Manual Therapy Soft tissue mobilization;Myofascial release    Manual therapy comments compresson and skilled Palpaiton with DN    Soft tissue mobilization soft tissue mobilizations with compression to right glut & piriformis.    Myofascial Release glut med & max trigger point release in sidelying    Muscle Energy Technique --      Prosthetics   Prosthetic Care Comments  PT used internet and discussed socket revision with possibility of suction sleeve suspension.  PT cued on burning discomfot may be pin alignment causing friction for liner and repositioning pin away area of pain / discomfort.    Current prosthetic wear tolerance (days/week)  daily    Current prosthetic wear tolerance (#hours/day)  most of awake hours    Current prosthetic weight-bearing tolerance (hours/day)  no limb pain with weight bearing today    Edema pitting    Residual limb condition  pt reports no issues    Education Provided Other (comment)   see prosthetic care comments             Trigger Point Dry Needling -  07/13/21 0835     Consent Given? Yes    Education Handout Provided Previously provided    Muscles Treated Back/Hip Gluteus medius;Gluteus maximus    Dry Needling Comments without E-stim    Piriformis Response Twitch response elicited            Dry Needling performed by Elsie Ra, PT, DPT         PT Short Term Goals - 06/30/21 1341       PT SHORT TERM GOAL #1   Title Patient verbalizes proper sweat managment with prosthesis.    Time 4    Period Weeks    Status Achieved    Target Date 06/18/21      PT SHORT TERM GOAL #2   Title Patient tolerates prosthesis >80% of awake hrs /day without increase in skin issues or limb pain <4/10 after standing.    Baseline Not Met due to infection in residual limb limiting weight bearing    Time 4    Period Weeks    Status Not Met    Target Date 06/18/21      PT SHORT TERM GOAL #3   Title Patient able to pick up items from floor & reach 10" without UE support safely.    Baseline Not Met due to infection in residual limb limiting weight bearing    Time 4    Period Weeks    Status Not Met    Target Date 06/18/21      PT SHORT TERM GOAL #4   Title Patient ambulates 200' with cane & prosthesis with supervision.  Baseline Not Met due to infection in residual limb limiting weight bearing    Time 4    Period Weeks    Status Not Met    Target Date 06/18/21      PT SHORT TERM GOAL #5   Title Patient negotiates ramps & curbs with cane & prosthesis with minA.    Baseline Not Met due to infection in residual limb limiting weight bearing    Time 4    Period Weeks    Status Not Met    Target Date 06/18/21               PT Long Term Goals - 07/13/21 0840       PT LONG TERM GOAL #1   Title Patient demonstrates & verbalized understanding of prosthetic care to enable safe utilization of prosthesis.    Time 12    Period Weeks    Status Achieved      PT LONG TERM GOAL #2   Title Patient tolerates prosthesis wear for  >90% of awake hours without skin issues & limb pain </= 2/10.    Time 12    Period Weeks    Status On-going      PT LONG TERM GOAL #3   Title Oceanographer >/= 36/56 to indicate lower fall risk    Time 12    Period Weeks    Status Achieved      PT LONG TERM GOAL #4   Title Patient ambulates 300' with LRAD & prosthesis modified independent    Time 12    Period Weeks    Status On-going      PT LONG TERM GOAL #5   Title Patient negotiates ramps, curbs & stairs with LRAD & prosthesis modified independent.    Time 12    Period Weeks    Status On-going                   Plan - 07/13/21 0805     Clinical Impression Statement Patient met Jeanice Lim LTG improving score to 39 / 56 from 17/56.  After right THA & rehab that score should be above 45/56 indicating low fall risk.    Personal Factors and Comorbidities Comorbidity 3+;Fitness;Time since onset of injury/illness/exacerbation;Past/Current Experience    Comorbidities Left TTTA, left THA (05/23/20), arthritis, DM2, HTN, HLD, PVD    Examination-Activity Limitations Locomotion Level;Squat;Stairs;Stand;Transfers    Examination-Participation Restrictions Community Activity;Occupation    Stability/Clinical Decision Making Evolving/Moderate complexity    Rehab Potential Good    PT Frequency 2x / week    PT Duration 12 weeks    PT Treatment/Interventions ADLs/Self Care Home Management;DME Instruction;Gait training;Stair training;Functional mobility training;Therapeutic activities;Therapeutic exercise;Balance training;Neuromuscular re-education;Patient/family education;Prosthetic Training;Vestibular;Dry needling;Manual techniques    PT Next Visit Plan assess remaining LTGs and discharge,  TUG with cane, household gait with cane & community with RW,  dry needling as indicated.    PT Home Exercise Plan Access Code: [email or web address]  + sink HEP    Consulted and Agree with Plan of Care Patient             Patient will benefit from  skilled therapeutic intervention in order to improve the following deficits and impairments:  Abnormal gait, Decreased activity tolerance, Decreased balance, Decreased endurance, Decreased knowledge of use of DME, Decreased mobility, Decreased range of motion, Decreased skin integrity, Decreased strength, Increased edema, Postural dysfunction, Prosthetic Dependency, Pain  Visit Diagnosis: Other abnormalities of gait and mobility  Unsteadiness on feet  Muscle weakness (generalized)  Stiffness of left knee, not elsewhere classified     Problem List Patient Active Problem List   Diagnosis Date Noted   S/P BKA (below knee amputation) unilateral, left ([place]) 10/03/2020   Non-healing wound of lower extremity 10/03/2020   History of amputation of left foot through metatarsal bone ([place]) 09/11/2020   PAD (peripheral artery disease) ([place]) 08/20/2020   Status post total replacement of left hip 06/05/2020   Unilateral primary osteoarthritis, right hip 04/17/2020   Unilateral primary osteoarthritis, left hip 04/17/2020   Amputated toe, right ([place]) 11/22/2018   Type 2 diabetes mellitus with foot ulcer, without long-term current use of insulin ([place]) 11/22/2018   Essential hypertension 11/22/2018   Mixed hyperlipidemia 11/22/2018   Morbid obesity ([place]) 11/22/2018   Aneurysm of left popliteal artery ([place]) 11/22/2018   Status post peripheral artery angioplasty with insertion of stent 11/22/2018   Encounter for smoking cessation counseling 11/22/2018   Osteomyelitis of third toe of right foot ([place])    Cellulitis of third toe of right foot     Jamey Reas, PT, DPT 07/13/2021, 8:40 AM  [facility] [address] [place], [place], [address] Phone: [phone]   Fax:  [phone]  Name: AARYN SERMON MRN: [id number] Date of Birth: Nov 07, 1960

## 2021-07-15 ENCOUNTER — Ambulatory Visit (INDEPENDENT_AMBULATORY_CARE_PROVIDER_SITE_OTHER): Payer: BC Managed Care – PPO | Admitting: Physical Therapy

## 2021-07-15 ENCOUNTER — Other Ambulatory Visit: Payer: Self-pay

## 2021-07-15 ENCOUNTER — Encounter: Payer: Self-pay | Admitting: Physical Therapy

## 2021-07-15 DIAGNOSIS — M6281 Muscle weakness (generalized): Secondary | ICD-10-CM

## 2021-07-15 DIAGNOSIS — R2689 Other abnormalities of gait and mobility: Secondary | ICD-10-CM | POA: Diagnosis not present

## 2021-07-15 DIAGNOSIS — M25662 Stiffness of left knee, not elsewhere classified: Secondary | ICD-10-CM | POA: Diagnosis not present

## 2021-07-15 DIAGNOSIS — R293 Abnormal posture: Secondary | ICD-10-CM

## 2021-07-15 DIAGNOSIS — R2681 Unsteadiness on feet: Secondary | ICD-10-CM

## 2021-07-15 NOTE — Therapy (Signed)
[facility] [address] [place], [place], [address] Phone: [phone]   Fax:  [phone]  Physical Therapy Treatment & Discharge Summary  Patient Details  Name: Anthony Park MRN: [id number] Date of Birth: January 08, 1961 Referring Provider (PT): Harold Barban, MD   Encounter Date: 07/15/2021  PHYSICAL THERAPY DISCHARGE SUMMARY  Visits from Start of Care: 25  Current functional level related to goals / functional outcomes: See below   Remaining deficits: See below   Education / Equipment: HEP & prosthetic care   Patient agrees to discharge. Patient goals were met. Patient is being discharged due to meeting the stated rehab goals.    PT End of Session - 07/15/21 0808     Visit Number 25    Number of Visits 25    Date for PT Re-Evaluation 07/17/21    Authorization Type [employer] comm ppo    Authorization Time Period DED MET AND OOP MET, 120 Visits PT/OT/ST COMBINED,Remaining 116 Visits    Authorization - Visit Number 25    Authorization - Number of Visits 116    PT Start Time 0800    PT Stop Time 8841    PT Time Calculation (min) 44 min    Equipment Utilized During Treatment Gait belt    Activity Tolerance Patient tolerated treatment well;Patient limited by pain    Behavior During Therapy WFL for tasks assessed/performed             Past Medical History:  Diagnosis Date   Arthritis    Diabetes mellitus without complication ([facility])    type 2 controlled with diet    Diabetic toe ulcer ([place]) 11/05/2018   History of kidney stones    passed 1 several years ago   Hyperlipidemia    Hypertension    not on medications   Peripheral vascular disease ([place])    stent in left leg due to anerysm     Past Surgical History:  Procedure Laterality Date    Left Transmetatarsal Ampuation (Left Foot)  08/20/2020   ABDOMINAL AORTOGRAM W/LOWER EXTREMITY N/A 08/06/2020   Procedure: ABDOMINAL AORTOGRAM W/LOWER EXTREMITY;  Surgeon: Marty Heck, MD;  Location: [place] [facility];  Service: Cardiovascular;  Laterality: N/A;   AMPUTATION Right 11/07/2018   Procedure: RIGHT 3RD TOE AMPUTATION;  Surgeon: Newt Minion, MD;  Location: [place];  Service: Orthopedics;  Laterality: Right;   AMPUTATION Left 08/20/2020   Procedure: Left Transmetatarsal Ampuation;  Surgeon: Marty Heck, MD;  Location: [place];  Service: Vascular;  Laterality: Left;   AMPUTATION Left 10/03/2020   Procedure: LEFT BELOW KNEE AMPUTATION;  Surgeon: Marty Heck, MD;  Location: [place];  Service: Vascular;  Laterality: Left;   AMPUTATION TOE Left    big toe and one next to it   CATARACT EXTRACTION Bilateral    EYE SURGERY Bilateral    cataracts removed   PERIPHERAL VASCULAR BALLOON ANGIOPLASTY Left 08/06/2020   Procedure: PERIPHERAL VASCULAR BALLOON ANGIOPLASTY;  Surgeon: Marty Heck, MD;  Location: [facility] [facility];  Service: Cardiovascular;  Laterality: Left;  Peroneal artery.   TOE AMPUTATION Left 2015   TOTAL HIP ARTHROPLASTY Left 05/23/2020   Procedure: LEFT TOTAL HIP ARTHROPLASTY ANTERIOR APPROACH;  Surgeon: Mcarthur Rossetti, MD;  Location: [facility];  Service: Orthopedics;  Laterality: Left;   WOUND DEBRIDEMENT Left 09/11/2020   Procedure: DEBRIDEMENT OF LEFT TRANSMETATARSAL WOUND;  Surgeon: Cherre Robins, MD;  Location: [facility];  Service: Vascular;  Laterality: Left;  There were no vitals filed for this visit.   Subjective Assessment - 07/15/21 0800     Subjective He saw Dr. Sharol Given who says infection is cleared to enable progression with THA on 11/18.  He is susposed to complete course of antibiodic.    Pertinent History Left TTTA, left THA (05/23/20), arthritis, DM2, HTN, HLD, PVD    Patient Stated Goals Wants to use prosthesis to return to some work, get back in community, be mobile, work in gym in his garage    Currently in Pain? Yes    Pain Score 7     Pain Location Hip    Pain Orientation Right    Pain  Descriptors / Indicators Aching;Sore;Sharp    Pain Type Chronic pain    Pain Onset More than a month ago    Pain Frequency Intermittent    Aggravating Factors  arthritis    Pain Relieving Factors meds, stretching    Effect of Pain on Daily Activities sleeping & walking    Pain Onset More than a month ago                [facility] PT Assessment - 07/15/21 0800       Assessment   Medical Diagnosis Left Transtibial Amputaion    Referring Provider (PT) Harold Barban, MD    Onset Date/Surgical Date 04/16/21   prosthesis delivery   Hand Dominance Right      Transfers   Sit to Stand 6: Modified independent (Device/Increase time);With upper extremity assist;From chair/3-in-1    Stand to Sit 6: Modified independent (Device/Increase time);With upper extremity assist;To chair/3-in-1      Ambulation/Gait   Ambulation/Gait Yes    Ambulation/Gait Assistance 6: Modified independent (Device/Increase time)    Ambulation/Gait Assistance Details gait & standing are limited by arthritic right hip not prosthesis.  He ambulates community distances up to 350' with rollator walker and household up to 67' with cane freeing one hand to carry items like drink between roooms.  His gait is slow & gaurded due to right hip.    Ambulation Distance (Feet) 350 Feet   350' rollator and 50' cane stand alone tip   Assistive device 4-wheeled walker;Rollator;Straight cane;Prosthesis    Gait Pattern Step-through pattern;Decreased stance time - right;Antalgic;Lateral hip instability    Ambulation Surface Level;Indoor    Gait velocity 0.83 ft/sec with rollator    Ramp 6: Modified independent (Device)   rollator walker & TTA prosthesis   Curb 6: Modified independent (Device/increase time)   rollator walker & TTA prosthesis     Berg Balance Test   Sit to Stand Park to stand  independently using hands    Standing Unsupported Park to stand safely 2 minutes    Sitting with Back Unsupported but Feet Supported on Floor or  Stool Park to sit safely and securely 2 minutes    Stand to Sit Controls descent by using hands    Transfers Park to transfer safely, definite need of hands    Standing Unsupported with Eyes Closed Park to stand 10 seconds safely    Standing Unsupported with Feet Together Park to place feet together independently and stand 1 minute safely    From Standing, Reach Forward with Outstretched Arm Can reach confidently >25 cm (10")    From Standing Position, Pick up Object from Floor Park to pick up shoe safely and easily    From Standing Position, Turn to Look Behind Over each Shoulder Looks behind one side only/other side shows  less weight shift    Turn 360 Degrees Needs close supervision or verbal cueing    Standing Unsupported, Alternately Place Feet on Step/Stool Needs assistance to keep from falling or unable to try    Standing Unsupported, One Foot in [place] to take small step independently and hold 30 seconds    Standing on One Leg Unable to try or needs assist to prevent fall    Total Score 39    Berg comment: at eval on 04/21/21 was 17/56             Prosthetics Assessment - 07/15/21 0800       [place] with Skin check;Residual limb care;Care of non-amputated limb;Prosthetic cleaning;Ply sock cleaning;Correct ply sock adjustment;Proper wear schedule/adjustment;Proper weight-bearing schedule/adjustment    Donning prosthesis  Modified independent (Device/Increase time)    Doffing prosthesis  Modified independent (Device/Increase time)    Current prosthetic wear tolerance (days/week)  daily    Current prosthetic wear tolerance (#hours/day)  most of awake hours    Current prosthetic weight-bearing tolerance (hours/day)  no limb pain with weight bearing today    Edema mild pitting    Residual limb condition  pt reports no issues    Prosthesis Description silicon liner with shuttle pin lock suspension, dynamic response foot                           [facility] Adult PT Treatment/Exercise - 07/15/21 0800       Knee/Hip Exercises: Aerobic   Nustep Seat 12 level 8 with BUEs & BLEs for 8 min                       PT Short Term Goals - 06/30/21 1341       PT SHORT TERM GOAL #1   Title Patient verbalizes proper sweat managment with prosthesis.    Time 4    Period Weeks    Status Achieved    Target Date 06/18/21      PT SHORT TERM GOAL #2   Title Patient tolerates prosthesis >80% of awake hrs /day without increase in skin issues or limb pain <4/10 after standing.    Baseline Not Met due to infection in residual limb limiting weight bearing    Time 4    Period Weeks    Status Not Met    Target Date 06/18/21      PT SHORT TERM GOAL #3   Title Patient Park to pick up items from floor & reach 10" without UE support safely.    Baseline Not Met due to infection in residual limb limiting weight bearing    Time 4    Period Weeks    Status Not Met    Target Date 06/18/21      PT SHORT TERM GOAL #4   Title Patient ambulates 200' with cane & prosthesis with supervision.    Baseline Not Met due to infection in residual limb limiting weight bearing    Time 4    Period Weeks    Status Not Met    Target Date 06/18/21      PT SHORT TERM GOAL #5   Title Patient negotiates ramps & curbs with cane & prosthesis with minA.    Baseline Not Met due to infection in residual limb limiting weight bearing    Time 4    Period Weeks    Status Not Met  Target Date 06/18/21               PT Long Term Goals - 07/15/21 1036       PT LONG TERM GOAL #1   Title Patient demonstrates & verbalized understanding of prosthetic care to enable safe utilization of prosthesis.    Time 12    Period Weeks    Status Achieved      PT LONG TERM GOAL #2   Title Patient tolerates prosthesis wear for >90% of awake hours without skin issues & limb pain </= 2/10.    Baseline no pain in residual limb.    Time 12     Period Weeks    Status Achieved      PT LONG TERM GOAL #3   Title Berg Balance >/= 36/56 to indicate lower fall risk    Baseline Berg 39/56    Time 12    Period Weeks    Status Achieved      PT LONG TERM GOAL #4   Title Patient ambulates 300' with LRAD & prosthesis modified independent    Baseline MET with rollator walker & prosthesis    Time 12    Period Weeks    Status Achieved      PT LONG TERM GOAL #5   Title Patient negotiates ramps, curbs & stairs with LRAD & prosthesis modified independent.    Baseline MET with rollator walker & prosthesis for ramps & curbs and BUE support on rail on stairs    Time 12    Period Weeks    Status Achieved                   Plan - 07/15/21 1032     Clinical Impression Statement Patient met all LTGs set.  He is functioning with prosthesis at maximal level tolerated with right hip arthritic pain.  He is scheduled for right Total Hip Arthroplasty anterior approach with Dr. Ninfa Linden on 07/31/2021.  He improved his Berg Balance to 39/56 from 17/56 initially. Score <45/56 indicates high fall risk and PT anticipates with hip replacement & subsequent rehab he should be Park to achieve this level.    Personal Factors and Comorbidities Comorbidity 3+;Fitness;Time since onset of injury/illness/exacerbation;Past/Current Experience    Comorbidities Left TTTA, left THA (05/23/20), arthritis, DM2, HTN, HLD, PVD    Examination-Activity Limitations Locomotion Level;Squat;Stairs;Stand;Transfers    Examination-Participation Restrictions Community Activity;Occupation    Stability/Clinical Decision Making Evolving/Moderate complexity    Rehab Potential Good    PT Frequency 2x / week    PT Duration 12 weeks    PT Treatment/Interventions ADLs/Self Care Home Management;DME Instruction;Gait training;Stair training;Functional mobility training;Therapeutic activities;Therapeutic exercise;Balance training;Neuromuscular re-education;Patient/family  education;Prosthetic Training;Vestibular;Dry needling;Manual techniques    PT Next Visit Plan discharge PT for this episode of care,  He would benefit from PT following THA surgery.    PT Home Exercise Plan Access Code: [email or web address]  + sink HEP    Consulted and Agree with Plan of Care Patient             Patient will benefit from skilled therapeutic intervention in order to improve the following deficits and impairments:  Abnormal gait, Decreased activity tolerance, Decreased balance, Decreased endurance, Decreased knowledge of use of DME, Decreased mobility, Decreased range of motion, Decreased skin integrity, Decreased strength, Increased edema, Postural dysfunction, Prosthetic Dependency, Pain  Visit Diagnosis: Other abnormalities of gait and mobility  Unsteadiness on feet  Muscle weakness (generalized)  Stiffness of left knee, not elsewhere  classified  Abnormal posture     Problem List Patient Active Problem List   Diagnosis Date Noted   S/P BKA (below knee amputation) unilateral, left ([place]) 10/03/2020   Non-healing wound of lower extremity 10/03/2020   History of amputation of left foot through metatarsal bone ([place]) 09/11/2020   PAD (peripheral artery disease) ([place]) 08/20/2020   Status post total replacement of left hip 06/05/2020   Unilateral primary osteoarthritis, right hip 04/17/2020   Unilateral primary osteoarthritis, left hip 04/17/2020   Amputated toe, right ([place]) 11/22/2018   Type 2 diabetes mellitus with foot ulcer, without long-term current use of insulin ([place]) 11/22/2018   Essential hypertension 11/22/2018   Mixed hyperlipidemia 11/22/2018   Morbid obesity ([place]) 11/22/2018   Aneurysm of left popliteal artery ([place]) 11/22/2018   Status post peripheral artery angioplasty with insertion of stent 11/22/2018   Encounter for smoking cessation counseling 11/22/2018   Osteomyelitis of third toe of right foot ([place])    Cellulitis of third toe of right foot     Jamey Reas, PT, DPT 07/15/2021, 10:37 AM  [facility] [address] [place], [place], [address] Phone: [phone]   Fax:  [phone]  Name: Anthony Park MRN: [id number] Date of Birth: [birth year]

## 2021-07-17 NOTE — Progress Notes (Signed)
Please enter orders for PAT visit scheduled for 07-24-21 

## 2021-07-21 ENCOUNTER — Other Ambulatory Visit: Payer: Self-pay | Admitting: Physician Assistant

## 2021-07-21 DIAGNOSIS — M1611 Unilateral primary osteoarthritis, right hip: Secondary | ICD-10-CM

## 2021-07-22 NOTE — Patient Instructions (Signed)
DUE TO COVID-19 ONLY ONE VISITOR IS ALLOWED TO COME WITH YOU AND STAY IN THE WAITING ROOM ONLY DURING PRE OP AND PROCEDURE.   **NO VISITORS ARE ALLOWED IN THE SHORT STAY AREA OR RECOVERY ROOM!!**  IF YOU WILL BE ADMITTED INTO THE HOSPITAL YOU ARE ALLOWED ONLY TWO SUPPORT PEOPLE DURING VISITATION HOURS ONLY (7 AM -8PM)   The support person(s) must pass our screening, gel in and out, and wear a mask at all times, including in the patient's room. Patients must also wear a mask when staff or their support person are in the room. Visitors GUEST BADGE MUST BE WORN VISIBLY  One adult visitor may remain with you overnight and MUST be in the room by 8 P.M.  No visitors under the age of 46. Any visitor under the age of 42 must be accompanied by an adult.    COVID SWAB TESTING MUST BE COMPLETED ON:  07/29/21 **MUST PRESENT COMPLETED FORM AT TESTING SITE**    [address]  [place] (backside of the building) You are not required to quarantine, however you are required to wear a well-fitted mask when you are out and around people not in your household.  Hand Hygiene often Do NOT share personal items Notify your provider if you are in close contact with someone who has COVID or you develop fever 100.4 or greater, new onset of sneezing, cough, sore throat, shortness of breath or body aches.  [facility] En[facility] [address], [address], must go inside of the hospital, NOT A DRIVE THRU!  (Must self quarantine after testing. Follow instructions on handout.)       Your procedure is scheduled on: 07/31/21   Report to [facility] Main Entr[facility]    Report to admitting at: 7:00 AM   Call this number if you have problems the morning of surgery [phone]   Do not eat food :After Midnight.   May have liquids until : 6:45 AM   day of surgery  CLEAR LIQUID DIET  Foods Allowed                                                                      Foods Excluded  Water, Black Coffee and tea, regular and decaf                             liquids that you cannot  Plain Jell-O in any flavor  (No red)                                           see through such as: Fruit ices (not with fruit pulp)                                     milk, soups, orange juice              Iced Popsicles (No red)  All solid food                                   Apple juices Sports drinks like Gatorade (No red) Lightly seasoned clear broth or consume(fat free) Sugar  Sample Menu Breakfast                                Lunch                                     Supper Cranberry juice                    Beef broth                            Chicken broth Jell-O                                     Grape juice                           Apple juice Coffee or tea                        Jell-O                                      Popsicle                                                Coffee or tea                        Coffee or tea      Complete one Gatorade drink the morning of surgery at: 6:45 AM       the day of surgery.   The day of surgery:  Drink ONE (1) Pre-Surgery Clear Ensure or G2 by am the morning of surgery. Drink in one sitting. Do not sip.  This drink was given to you during your hospital  pre-op appointment visit. Nothing else to drink after completing the  Pre-Surgery Clear Ensure or G2.          If you have questions, please contact your surgeon's office.    Oral Hygiene is also important to reduce your risk of infection.                                    Remember - BRUSH YOUR TEETH THE MORNING OF SURGERY WITH YOUR REGULAR TOOTHPASTE   Do NOT smoke after Midnight   Take these medicines the morning of surgery with A SIP OF WATER: gabapentin as needed.  How to Manage Your Diabetes Before and After Surgery  Why is it important to control my blood sugar before and after surgery? Improving blood sugar  levels  before and after surgery helps healing and can limit problems. A way of improving blood sugar control is eating a healthy diet by:  Eating less sugar and carbohydrates  Increasing activity/exercise  Talking with your doctor about reaching your blood sugar goals High blood sugars (greater than 180 mg/dL) can raise your risk of infections and slow your recovery, so you will need to focus on controlling your diabetes during the weeks before surgery. Make sure that the doctor who takes care of your diabetes knows about your planned surgery including the date and location.  How do I manage my blood sugar before surgery? Check your blood sugar at least 4 times a day, starting 2 days before surgery, to make sure that the level is not too high or low. Check your blood sugar the morning of your surgery when you wake up and every 2 hours until you get to the Short Stay unit. If your blood sugar is less than 70 mg/dL, you will need to treat for low blood sugar: Do not take insulin. Treat a low blood sugar (less than 70 mg/dL) with  cup of clear juice (cranberry or apple), 4 glucose tablets, OR glucose gel. Recheck blood sugar in 15 minutes after treatment (to make sure it is greater than 70 mg/dL). If your blood sugar is not greater than 70 mg/dL on recheck, call [phone] for further instructions. Report your blood sugar to the short stay nurse when you get to Short Stay.  If you are admitted to the hospital after surgery: Your blood sugar will be checked by the staff and you will probably be given insulin after surgery (instead of oral diabetes medicines) to make sure you have good blood sugar levels. The goal for blood sugar control after surgery is 80-180 mg/dL.   WHAT DO I DO ABOUT MY DIABETES MEDICATION?  Do not take oral diabetes medicines (pills) the morning of surgery.  THE DAY BEFORE SURGERY, take metformin as usual.       THE MORNING OF SURGERY, DO NOT TAKE ANY ORAL DIABETIC  MEDICATIONS DAY OF YOUR SURGERY                              You may not have any metal on your body including hair pins, jewelry, and body piercing             Do not wear lotions, powders, perfumes/cologne, or deodorant              Men may shave face and neck.   Do not bring valuables to the hospital. [facility] IS NOT             RESPONSIBLE   FOR VALUABLES.   Contacts, dentures or bridgework may not be worn into surgery.   Bring small overnight bag day of surgery.    Patients discharged on the day of surgery will not be allowed to drive home.   Special Instructions: Bring a copy of your healthcare power of attorney and living will documents         the day of surgery if you haven't scanned them before.              Please read over the following fact sheets you were given: IF YOU HAVE QUESTIONS ABOUT YOUR PRE-OP INSTRUCTIONS PLEASE CALL [phone]     [facility] - Preparing for Surgery Before surgery, you can play an important role.  Because  skin is not sterile, your skin needs to be as free of germs as possible.  You can reduce the number of germs on your skin by washing with CHG (chlorahexidine gluconate) soap before surgery.  CHG is an antiseptic cleaner which kills germs and bonds with the skin to continue killing germs even after washing. Please DO NOT use if you have an allergy to CHG or antibacterial soaps.  If your skin becomes reddened/irritated stop using the CHG and inform your nurse when you arrive at Short Stay. Do not shave (including legs and underarms) for at least 48 hours prior to the first CHG shower.  You may shave your face/neck. Please follow these instructions carefully:  1.  Shower with CHG Soap the night before surgery and the  morning of Surgery.  2.  If you choose to wash your hair, wash your hair first as usual with your  normal  shampoo.  3.  After you shampoo, rinse your hair and body thoroughly to remove the  shampoo.                           4.   Use CHG as you would any other liquid soap.  You can apply chg directly  to the skin and wash                       Gently with a scrungie or clean washcloth.  5.  Apply the CHG Soap to your body ONLY FROM THE NECK DOWN.   Do not use on face/ open                           Wound or open sores. Avoid contact with eyes, ears mouth and genitals (private parts).                       Wash face,  Genitals (private parts) with your normal soap.             6.  Wash thoroughly, paying special attention to the area where your surgery  will be performed.  7.  Thoroughly rinse your body with warm water from the neck down.  8.  DO NOT shower/wash with your normal soap after using and rinsing off  the CHG Soap.                9.  Pat yourself dry with a clean towel.            10.  Wear clean pajamas.            11.  Place clean sheets on your bed the night of your first shower and do not  sleep with pets. Day of Surgery : Do not apply any lotions/deodorants the morning of surgery.  Please wear clean clothes to the hospital/surgery center.  FAILURE TO FOLLOW THESE INSTRUCTIONS MAY RESULT IN THE CANCELLATION OF YOUR SURGERY PATIENT SIGNATURE_________________________________  NURSE SIGNATURE__________________________________  ________________________________________________________________________   [place]  An incentive spirometer is a tool that can help keep your lungs clear and active. This tool measures how well you are filling your lungs with each breath. Taking long deep breaths may help reverse or decrease the chance of developing breathing (pulmonary) problems (especially infection) following: A long period of time when you are unable to move or be active. BEFORE THE PROCEDURE  If the spirometer includes  an indicator to show your best effort, your nurse or respiratory therapist will set it to a desired goal. If possible, sit up straight or lean slightly forward. Try not to  slouch. Hold the incentive spirometer in an upright position. INSTRUCTIONS FOR USE  Sit on the edge of your bed if possible, or sit up as far as you can in bed or on a chair. Hold the incentive spirometer in an upright position. Breathe out normally. Place the mouthpiece in your mouth and seal your lips tightly around it. Breathe in slowly and as deeply as possible, raising the piston or the ball toward the top of the column. Hold your breath for 3-5 seconds or for as long as possible. Allow the piston or ball to fall to the bottom of the column. Remove the mouthpiece from your mouth and breathe out normally. Rest for a few seconds and repeat Steps 1 through 7 at least 10 times every 1-2 hours when you are awake. Take your time and take a few normal breaths between deep breaths. The spirometer may include an indicator to show your best effort. Use the indicator as a goal to work toward during each repetition. After each set of 10 deep breaths, practice coughing to be sure your lungs are clear. If you have an incision (the cut made at the time of surgery), support your incision when coughing by placing a pillow or rolled up towels firmly against it. Once you are able to get out of bed, walk around indoors and cough well. You may stop using the incentive spirometer when instructed by your caregiver.  RISKS AND COMPLICATIONS Take your time so you do not get dizzy or light-headed. If you are in pain, you may need to take or ask for pain medication before doing incentive spirometry. It is harder to take a deep breath if you are having pain. AFTER USE Rest and breathe slowly and easily. It can be helpful to keep track of a log of your progress. Your caregiver can provide you with a simple table to help with this. If you are using the spirometer at home, follow these instructions: SEEK MEDICAL CARE IF:  You are having difficultly using the spirometer. You have trouble using the spirometer as often as  instructed. Your pain medication is not giving enough relief while using the spirometer. You develop fever of 100.5 F (38.1 C) or higher. SEEK IMMEDIATE MEDICAL CARE IF:  You cough up bloody sputum that had not been present before. You develop fever of 102 F (38.9 C) or greater. You develop worsening pain at or near the incision site. MAKE SURE YOU:  Understand these instructions. Will watch your condition. Will get help right away if you are not doing well or get worse. Document Released: 01/10/2007 Document Revised: 11/22/2011 Document Reviewed: 03/13/2007 [facility] Patient Information 2014 [place], [place].   ________________________________________________________________________

## 2021-07-24 ENCOUNTER — Other Ambulatory Visit: Payer: Self-pay

## 2021-07-24 ENCOUNTER — Encounter (HOSPITAL_COMMUNITY)
Admission: RE | Admit: 2021-07-24 | Discharge: 2021-07-24 | Disposition: A | Discharge status: Home / Self Care | Payer: BC Managed Care – PPO | Source: Ambulatory Visit | Attending: Orthopaedic Surgery | Admitting: Orthopaedic Surgery

## 2021-07-24 ENCOUNTER — Encounter (HOSPITAL_COMMUNITY): Payer: Self-pay

## 2021-07-24 VITALS — BP 116/78 | HR 66 | Temp 98.4°F | Ht 72.0 in | Wt 226.0 lb

## 2021-07-24 DIAGNOSIS — Z7902 Long term (current) use of antithrombotics/antiplatelets: Secondary | ICD-10-CM | POA: Diagnosis not present

## 2021-07-24 DIAGNOSIS — Z01818 Encounter for other preprocedural examination: Secondary | ICD-10-CM

## 2021-07-24 DIAGNOSIS — L97509 Non-pressure chronic ulcer of other part of unspecified foot with unspecified severity: Secondary | ICD-10-CM

## 2021-07-24 DIAGNOSIS — M1611 Unilateral primary osteoarthritis, right hip: Secondary | ICD-10-CM

## 2021-07-24 DIAGNOSIS — I10 Essential (primary) hypertension: Secondary | ICD-10-CM

## 2021-07-24 DIAGNOSIS — Z87891 Personal history of nicotine dependence: Secondary | ICD-10-CM | POA: Insufficient documentation

## 2021-07-24 DIAGNOSIS — Z89512 Acquired absence of left leg below knee: Secondary | ICD-10-CM | POA: Insufficient documentation

## 2021-07-24 DIAGNOSIS — E1151 Type 2 diabetes mellitus with diabetic peripheral angiopathy without gangrene: Secondary | ICD-10-CM | POA: Insufficient documentation

## 2021-07-24 DIAGNOSIS — E11621 Type 2 diabetes mellitus with foot ulcer: Secondary | ICD-10-CM

## 2021-07-24 LAB — BASIC METABOLIC PANEL
Anion gap: 11 (ref 5–15)
BUN: 21 mg/dL — ABNORMAL HIGH (ref 6–20)
CO2: 24 mmol/L (ref 22–32)
Calcium: 9.5 mg/dL (ref 8.9–10.3)
Chloride: 98 mmol/L (ref 98–111)
Creatinine, Ser: 0.9 mg/dL (ref 0.61–1.24)
GFR, Estimated: >60 mL/min (ref >60)
Glucose, Bld: 111 mg/dL — ABNORMAL HIGH (ref 70–99)
Potassium: 4 mmol/L (ref 3.5–5.1)
Sodium: 133 mmol/L — ABNORMAL LOW (ref 135–145)

## 2021-07-24 LAB — CBC
HCT: 40.4 % (ref 39.0–52.0)
Hemoglobin: 13.2 g/dL (ref 13.0–17.0)
MCH: 27 pg (ref 26.0–34.0)
MCHC: 32.7 g/dL (ref 30.0–36.0)
MCV: 82.6 fL (ref 80.0–100.0)
Platelets: 240 10*3/uL (ref 150–400)
RBC: 4.89 MIL/uL (ref 4.22–5.81)
RDW: 15.3 % (ref 11.5–15.5)
WBC: 8.4 10*3/uL (ref 4.0–10.5)
nRBC: 0 % (ref 0.0–0.2)

## 2021-07-24 LAB — HEMOGLOBIN A1C
Hgb A1c MFr Bld: 6.5 % — ABNORMAL HIGH (ref 4.8–5.6)
Mean Plasma Glucose: 139.85 mg/dL

## 2021-07-24 LAB — SURGICAL PCR SCREEN
MRSA, PCR: NEGATIVE
Staphylococcus aureus: NEGATIVE

## 2021-07-24 LAB — GLUCOSE, CAPILLARY: Glucose-Capillary: 119 mg/dL — ABNORMAL HIGH (ref 70–99)

## 2021-07-24 NOTE — Progress Notes (Signed)
COVID Vaccine Completed: Yes Date COVID Vaccine completed: 07/2020 x 3 COVID vaccine manufacturer: Pfizer     COVID Test: 07/29/21  PCP - Dr. Dorinda Hill Cardiologist -   Chest x-ray : 10/03/20 - CT Chest: 02/20/21 EKG -  Stress Test -  ECHO -  Cardiac Cath -  Pacemaker/ICD device last checked:  Sleep Study -  CPAP -   Fasting Blood Sugar - 100's Checks Blood Sugar ___1__ times a day  Blood Thinner Instructions: No instructions yet,pt. Is aware from previous surgery,to call surgeon's office for instructions for plavix and aspirin 81 mg. Aspirin Instructions: Last Dose:  Anesthesia review: Hx: DIA,HTN,PVD  Patient denies shortness of breath, fever, cough and chest pain at PAT appointment   Patient verbalized understanding of instructions that were given to them at the PAT appointment. Patient was also instructed that they will need to review over the PAT instructions again at home before surgery.

## 2021-07-27 NOTE — Progress Notes (Addendum)
Anesthesia Chart Review   Case: [id number] Date/Time: 07/31/21 0930   Procedure: RIGHT TOTAL HIP ARTHROPLASTY ANTERIOR APPROACH (Right: Hip)   Anesthesia type: Spinal   Pre-op diagnosis: osteoarthritis right hip   Location: [facility] 09 / [facility]   Surgeons: Kathryne Hitch, MD       DISCUSSION:60 y.o. former smoker with h/o DM II, HTN, PVD, right hip OA scheduled for above procedure 07/31/2021 with Dr. Doneen Poisson.   S/p left below knee amputation 10/03/20 with no anesthesia complications noted.   Pt reports to PAT nurse he has been given no instructions for holding Plavix.  He was advised to contact prescriber.  Discussed with Dr. Eliberto Ivory office, he will need to hold Plavix 5-7 days for spinal.   Addendum:  Pt reports last dose of Plavix 07/25/2021.   Anticipate pt can proceed with planned procedure barring acute status change.   VS: BP 116/78   Pulse 66   Temp 36.9 C (Oral)   Ht 6' (1.829 m)   Wt 102.5 kg   SpO2 96%   BMI 30.65 kg/m   PROVIDERS: Melida Quitter, MD is PCP    LABS: Labs reviewed: Acceptable for surgery. (all labs ordered are listed, but only abnormal results are displayed)  Labs Reviewed  HEMOGLOBIN A1C - Abnormal; Notable for the following components:      Result Value   Hgb A1c MFr Bld 6.5 (*)    All other components within normal limits  BASIC METABOLIC PANEL - Abnormal; Notable for the following components:   Sodium 133 (*)    Glucose, Bld 111 (*)    BUN 21 (*)    All other components within normal limits  GLUCOSE, CAPILLARY - Abnormal; Notable for the following components:   Glucose-Capillary 119 (*)    All other components within normal limits  SURGICAL PCR SCREEN  CBC  TYPE AND SCREEN     IMAGES:   EKG: 07/24/21 Rate 65 bpm  NSR RBBB  CV:  Past Medical History:  Diagnosis Date   Arthritis    Diabetes mellitus without complication (HCC)    type 2 controlled with diet    Diabetic toe ulcer (HCC) 11/05/2018    History of kidney stones    passed 1 several years ago   Hyperlipidemia    Hypertension    not on medications   Peripheral vascular disease (HCC)    stent in left leg due to anerysm     Past Surgical History:  Procedure Laterality Date    Left Transmetatarsal Ampuation (Left Foot)  08/20/2020   ABDOMINAL AORTOGRAM W/LOWER EXTREMITY N/A 08/06/2020   Procedure: ABDOMINAL AORTOGRAM W/LOWER EXTREMITY;  Surgeon: [name], MD;  Location: [facility] INVASIVE CV LAB;  Service: Cardiovascular;  Laterality: N/A;   AMPUTATION Right 11/07/2018   Procedure: RIGHT 3RD TOE AMPUTATION;  Surgeon: Nadara Mustard, MD;  Location: [facility] OR;  Service: Orthopedics;  Laterality: Right;   AMPUTATION Left 08/20/2020   Procedure: Left Transmetatarsal Ampuation;  Surgeon: [name], MD;  Location: [facility] OR;  Service: Vascular;  Laterality: Left;   AMPUTATION Left 10/03/2020   Procedure: LEFT BELOW KNEE AMPUTATION;  Surgeon: [name], MD;  Location: [facility] OR;  Service: Vascular;  Laterality: Left;   AMPUTATION TOE Left    big toe and one next to it   CATARACT EXTRACTION Bilateral    EYE SURGERY Bilateral    cataracts removed   INSERTION OF ILIAC STENT Left    PERIPHERAL  VASCULAR BALLOON ANGIOPLASTY Left 08/06/2020   Procedure: PERIPHERAL VASCULAR BALLOON ANGIOPLASTY;  Surgeon: [name], MD;  Location: [facility] LAB;  Service: Cardiovascular;  Laterality: Left;  Peroneal artery.   TOE AMPUTATION Left 2015   TOTAL HIP ARTHROPLASTY Left 05/23/2020   Procedure: LEFT TOTAL HIP ARTHROPLASTY ANTERIOR APPROACH;  Surgeon: Kathryne Hitch, MD;  Location: [facility]S;  Service: Orthopedics;  Laterality: Left;   WOUND DEBRIDEMENT Left 09/11/2020   Procedure: DEBRIDEMENT OF LEFT TRANSMETATARSAL WOUND;  Surgeon: Leonie Douglas, MD;  Location: [facility] OR;  Service: Vascular;  Laterality: Left;    MEDICATIONS:  acetaminophen (TYLENOL) 500 MG tablet   ascorbic acid (VITAMIN C) 500 MG tablet    aspirin EC 81 MG tablet   atorvastatin (LIPITOR) 10 MG tablet   cephALEXin (KEFLEX) 500 MG capsule   clopidogrel (PLAVIX) 75 MG tablet   cyclobenzaprine (FLEXERIL) 5 MG tablet   doxycycline (VIBRA-TABS) 100 MG tablet   folic acid (FOLVITE) 1 MG tablet   gabapentin (NEURONTIN) 300 MG capsule   lisinopril (ZESTRIL) 40 MG tablet   metFORMIN (GLUCOPHAGE) 500 MG tablet   methocarbamol (ROBAXIN) 500 MG tablet   Multiple Vitamin (MULTIVITAMIN WITH MINERALS) TABS tablet   oxyCODONE (ROXICODONE) 5 MG immediate release tablet   POLY-IRON 150 FORTE 150-25-1 MG-MCG-MG CAPS   polyethylene glycol (MIRALAX / GLYCOLAX) 17 g packet   rosuvastatin (CRESTOR) 10 MG tablet   senna (SENOKOT) 8.6 MG TABS tablet   No current facility-administered medications for this encounter.    Jodell Cipro Ward, PA-C [facility] Pre-Surgical Testing [phone]

## 2021-07-29 ENCOUNTER — Other Ambulatory Visit: Payer: Self-pay | Admitting: Surgery

## 2021-07-29 LAB — SARS CORONAVIRUS 2 (TAT 6-24 HRS): SARS Coronavirus 2: NEGATIVE

## 2021-07-30 ENCOUNTER — Telehealth: Payer: Self-pay

## 2021-07-30 NOTE — Anesthesia Preprocedure Evaluation (Addendum)
Anesthesia Evaluation  Patient identified by MRN, date of birth, ID band Patient awake    Reviewed: Allergy & Precautions, NPO status , Patient's Chart, lab work & pertinent test results  Airway Mallampati: II  TM Distance: >3 FB Neck ROM: Full    Dental no notable dental hx. (+) Teeth Intact, Dental Advisory Given   Pulmonary former smoker,    Pulmonary exam normal breath sounds clear to auscultation       Cardiovascular hypertension, Pt. on medications + Peripheral Vascular Disease  Normal cardiovascular exam Rhythm:Regular Rate:Normal     Neuro/Psych negative neurological ROS  negative psych ROS   GI/Hepatic Neg liver ROS,   Endo/Other  diabetes, Well Controlled, Type 2  Renal/GU Lab Results      Component                Value               Date                      CREATININE               0.90                07/24/2021                BUN                      21 (H)              07/24/2021                NA                       133 (L)             07/24/2021                K                        4.0                 07/24/2021                CL                       98                  07/24/2021                CO2                      24                  07/24/2021                Musculoskeletal  (+) Arthritis ,   Abdominal   Peds  Hematology Lab Results      Component                Value               Date                      WBC  8.4                 07/24/2021                HGB                      13.2                07/24/2021                HCT                      40.4                07/24/2021                MCV                      82.6                07/24/2021                PLT                      240                 07/24/2021              Anesthesia Other Findings S/P Bk A  Reproductive/Obstetrics                            Anesthesia  Physical Anesthesia Plan  ASA: 3  Anesthesia Plan: Spinal   Post-op Pain Management:    Induction:   PONV Risk Score and Plan: Treatment may vary due to age or medical condition, Ondansetron, Midazolam and Dexamethasone  Airway Management Planned: Natural Airway and Nasal Cannula  Additional Equipment: None  Intra-op Plan:   Post-operative Plan:   Informed Consent: I have reviewed the patients History and Physical, chart, labs and discussed the procedure including the risks, benefits and alternatives for the proposed anesthesia with the patient or authorized representative who has indicated his/her understanding and acceptance.     Dental advisory given  Plan Discussed with: CRNA and Anesthesiologist  Anesthesia Plan Comments: (Pt on Plavix  Spinal)       Anesthesia Quick Evaluation

## 2021-07-30 NOTE — Telephone Encounter (Signed)
Per patient request, faxed all office visit notes from 2022 to [employer] at [phone]. ROI on file.

## 2021-07-30 NOTE — H&P (Signed)
TOTAL HIP ADMISSION H&P  Patient is admitted for right total hip arthroplasty.  Subjective:  Chief Complaint: right hip pain  HPI: Anthony Park, 60 y.o. male, has a history of pain and functional disability in the right hip(s) due to arthritis and patient has failed non-surgical conservative treatments for greater than 12 weeks to include NSAID's and/or analgesics, flexibility and strengthening excercises, use of assistive devices, and activity modification.  Onset of symptoms was gradual starting 3 years ago with gradually worsening course since that time.The patient noted no past surgery on the right hip(s).  Patient currently rates pain in the right hip at 10 out of 10 with activity. Patient has night pain, worsening of pain with activity and weight bearing, pain that interfers with activities of daily living, and pain with passive range of motion. Patient has evidence of subchondral sclerosis, periarticular osteophytes, and joint space narrowing by imaging studies. This condition presents safety issues increasing the risk of falls.  There is no current active infection.  Patient Active Problem List   Diagnosis Date Noted   S/P BKA (below knee amputation) unilateral, left ([place]) 10/03/2020   Non-healing wound of lower extremity 10/03/2020   History of amputation of left foot through metatarsal bone ([place]) 09/11/2020   PAD (peripheral artery disease) ([place]) 08/20/2020   Status post total replacement of left hip 06/05/2020   Unilateral primary osteoarthritis, right hip 04/17/2020   Unilateral primary osteoarthritis, left hip 04/17/2020   Amputated toe, right ([place]) 11/22/2018   Type 2 diabetes mellitus with foot ulcer, without long-term current use of insulin ([place]) 11/22/2018   Essential hypertension 11/22/2018   Mixed hyperlipidemia 11/22/2018   Morbid obesity ([place]) 11/22/2018   Aneurysm of left popliteal artery ([place]) 11/22/2018   Status post peripheral artery angioplasty with insertion of  stent 11/22/2018   Encounter for smoking cessation counseling 11/22/2018   Osteomyelitis of third toe of right foot ([place])    Cellulitis of third toe of right foot    Past Medical History:  Diagnosis Date   Arthritis    Diabetes mellitus without complication ([place])    type 2 controlled with diet    Diabetic toe ulcer ([place]) 11/05/2018   History of kidney stones    passed 1 several years ago   Hyperlipidemia    Hypertension    not on medications   Peripheral vascular disease ([place])    stent in left leg due to anerysm     Past Surgical History:  Procedure Laterality Date    Left Transmetatarsal Ampuation (Left Foot)  08/20/2020   ABDOMINAL AORTOGRAM W/LOWER EXTREMITY N/A 08/06/2020   Procedure: ABDOMINAL AORTOGRAM W/LOWER EXTREMITY;  Surgeon: Marty Heck, MD;  Location: [facility];  Service: Cardiovascular;  Laterality: N/A;   AMPUTATION Right 11/07/2018   Procedure: RIGHT 3RD TOE AMPUTATION;  Surgeon: Newt Minion, MD;  Location: [facility];  Service: Orthopedics;  Laterality: Right;   AMPUTATION Left 08/20/2020   Procedure: Left Transmetatarsal Ampuation;  Surgeon: Marty Heck, MD;  Location: [facility];  Service: Vascular;  Laterality: Left;   AMPUTATION Left 10/03/2020   Procedure: LEFT BELOW KNEE AMPUTATION;  Surgeon: Marty Heck, MD;  Location: [facility];  Service: Vascular;  Laterality: Left;   AMPUTATION TOE Left    big toe and one next to it   CATARACT EXTRACTION Bilateral    EYE SURGERY Bilateral    cataracts removed   INSERTION OF ILIAC STENT Left    PERIPHERAL VASCULAR BALLOON ANGIOPLASTY  Left 08/06/2020   Procedure: PERIPHERAL VASCULAR BALLOON ANGIOPLASTY;  Surgeon: [name], MD;  Location: [facility];  Service: Cardiovascular;  Laterality: Left;  Peroneal artery.   TOE AMPUTATION Left 2015   TOTAL HIP ARTHROPLASTY Left 05/23/2020   Procedure: LEFT TOTAL HIP ARTHROPLASTY ANTERIOR APPROACH;  Surgeon: Kathryne Hitch, MD;   Location: [facility];  Service: Orthopedics;  Laterality: Left;   WOUND DEBRIDEMENT Left 09/11/2020   Procedure: DEBRIDEMENT OF LEFT TRANSMETATARSAL WOUND;  Surgeon: Leonie Douglas, MD;  Location: [facility];  Service: Vascular;  Laterality: Left;    No current facility-administered medications for this encounter.   Current Outpatient Medications  Medication Sig Dispense Refill Last Dose   acetaminophen (TYLENOL) 500 MG tablet Take 1,000 mg by mouth every 6 (six) hours as needed for moderate pain.      ascorbic acid (VITAMIN C) 500 MG tablet Take 500 mg by mouth daily.      aspirin EC 81 MG tablet Take 1 tablet (81 mg total) by mouth 2 (two) times daily after a meal. Swallow whole. 30 tablet 0    clopidogrel (PLAVIX) 75 MG tablet Take 1 tablet (75 mg total) by mouth daily. 30 tablet 11    doxycycline (VIBRA-TABS) 100 MG tablet Take 1 tablet (100 mg total) by mouth 2 (two) times daily. 60 tablet 0    folic acid (FOLVITE) 1 MG tablet Take 1 tablet (1 mg total) by mouth daily. 30 tablet 1    gabapentin (NEURONTIN) 300 MG capsule Take 2 capsules (600 mg total) by mouth 2 (two) times daily. (Patient taking differently: Take 300 mg by mouth 2 (two) times daily as needed (pain).) 120 capsule 2    lisinopril (ZESTRIL) 40 MG tablet Take 1 tablet (40 mg total) by mouth daily. 30 tablet 0    metFORMIN (GLUCOPHAGE) 500 MG tablet TAKE 1 TABLET BY MOUTH 2 TIMES DAILY WITH A MEAL. 60 tablet 0    methocarbamol (ROBAXIN) 500 MG tablet Take 1 tablet (500 mg total) by mouth every 8 (eight) hours as needed for muscle spasms. 30 tablet 0    Multiple Vitamin (MULTIVITAMIN WITH MINERALS) TABS tablet Take 1 tablet by mouth daily.      POLY-IRON 150 FORTE 150-25-1 MG-MCG-MG CAPS Take 150 mg by mouth daily.      rosuvastatin (CRESTOR) 10 MG tablet Take 10 mg by mouth at bedtime.      atorvastatin (LIPITOR) 10 MG tablet TAKE 1 TABLET BY MOUTH EVERY DAY (Patient not taking: Reported on 07/21/2021) 90 tablet 3 Not Taking    cephALEXin (KEFLEX) 500 MG capsule Take 1 capsule (500 mg total) by mouth 3 (three) times daily. (Patient not taking: No sig reported) 21 capsule 0 Not Taking   cyclobenzaprine (FLEXERIL) 5 MG tablet Take 1 tablet (5 mg total) by mouth 3 (three) times daily as needed for muscle spasms. (Patient not taking: No sig reported) 30 tablet 0 Not Taking   oxyCODONE (ROXICODONE) 5 MG immediate release tablet Take 1 tablet (5 mg total) by mouth every 6 (six) hours as needed for severe pain. (Patient not taking: No sig reported) 20 tablet 0 Not Taking   polyethylene glycol (MIRALAX / GLYCOLAX) 17 g packet Take 17 g by mouth daily as needed for mild constipation. (Patient not taking: No sig reported) 14 each 0 Not Taking   senna (SENOKOT) 8.6 MG TABS tablet Take 2 tablets (17.2 mg total) by mouth at bedtime. (Patient not taking: No sig reported) 120  tablet 0 Not Taking   Allergies  Allergen Reactions   Meloxicam Nausea Only    Upset stomach     Social History   Tobacco Use   Smoking status: Former    Packs/day: 0.50    Years: 41.00    Pack years: 20.50    Types: Cigarettes    Quit date: 07/20/2020    Years since quitting: 1.0   Smokeless tobacco: Never  Substance Use Topics   Alcohol use: Yes    Alcohol/week: 14.0 standard drinks    Types: 14 Standard drinks or equivalent per week    Comment: occas.    Family History  Problem Relation Age of Onset   COPD Mother    Cataracts Mother    Brain cancer Father    Hypertension Brother      Review of Systems  All other systems reviewed and are negative.  Objective:  Physical Exam Vitals reviewed.  Constitutional:      Appearance: Normal appearance.  HENT:     Head: Normocephalic and atraumatic.  Eyes:     Extraocular Movements: Extraocular movements intact.     Pupils: Pupils are equal, round, and reactive to light.  Cardiovascular:     Rate and Rhythm: Normal rate and regular rhythm.  Pulmonary:     Breath sounds: Normal breath  sounds.  Musculoskeletal:     Cervical back: Normal range of motion and neck supple.     Right hip: Tenderness and bony tenderness present. Decreased range of motion. Decreased strength.  Neurological:     Mental Status: He is alert and oriented to person, place, and time.  Psychiatric:        Behavior: Behavior normal.    Vital signs in last 24 hours:    Labs:   Estimated body mass index is 30.65 kg/m as calculated from the following:   Height as of 07/24/21: 6' (1.829 m).   Weight as of 07/24/21: 102.5 kg.   Imaging Review Plain radiographs demonstrate severe degenerative joint disease of the right hip(s). The bone quality appears to be good for age and reported activity level.      Assessment/Plan:  End stage arthritis, right hip(s)  The patient history, physical examination, clinical judgement of the provider and imaging studies are consistent with end stage degenerative joint disease of the right hip(s) and total hip arthroplasty is deemed medically necessary. The treatment options including medical management, injection therapy, arthroscopy and arthroplasty were discussed at length. The risks and benefits of total hip arthroplasty were presented and reviewed. The risks due to aseptic loosening, infection, stiffness, dislocation/subluxation,  thromboembolic complications and other imponderables were discussed.  The patient acknowledged the explanation, agreed to proceed with the plan and consent was signed. Patient is being admitted for inpatient treatment for surgery, pain control, PT, OT, prophylactic antibiotics, VTE prophylaxis, progressive ambulation and ADL's and discharge planning.The patient is planning to be discharged home with home health services

## 2021-07-31 ENCOUNTER — Ambulatory Visit (HOSPITAL_COMMUNITY): Payer: BC Managed Care – PPO

## 2021-07-31 ENCOUNTER — Ambulatory Visit (HOSPITAL_COMMUNITY): Payer: BC Managed Care – PPO | Admitting: Certified Registered Nurse Anesthetist

## 2021-07-31 ENCOUNTER — Encounter (HOSPITAL_COMMUNITY): Payer: Self-pay | Admitting: Orthopaedic Surgery

## 2021-07-31 ENCOUNTER — Ambulatory Visit (HOSPITAL_COMMUNITY)
Admission: RE | Admit: 2021-07-31 | Discharge: 2021-07-31 | Disposition: A | Discharge status: Home / Self Care | Payer: BC Managed Care – PPO | Source: Ambulatory Visit | Attending: Orthopaedic Surgery | Admitting: Orthopaedic Surgery

## 2021-07-31 ENCOUNTER — Ambulatory Visit (HOSPITAL_COMMUNITY): Payer: BC Managed Care – PPO | Admitting: Physician Assistant

## 2021-07-31 ENCOUNTER — Encounter (HOSPITAL_COMMUNITY)
Admission: RE | Disposition: A | Discharge status: Home / Self Care | Payer: Self-pay | Source: Ambulatory Visit | Attending: Orthopaedic Surgery

## 2021-07-31 DIAGNOSIS — E1151 Type 2 diabetes mellitus with diabetic peripheral angiopathy without gangrene: Secondary | ICD-10-CM | POA: Insufficient documentation

## 2021-07-31 DIAGNOSIS — M25451 Effusion, right hip: Secondary | ICD-10-CM | POA: Diagnosis not present

## 2021-07-31 DIAGNOSIS — M25751 Osteophyte, right hip: Secondary | ICD-10-CM | POA: Insufficient documentation

## 2021-07-31 DIAGNOSIS — I10 Essential (primary) hypertension: Secondary | ICD-10-CM | POA: Diagnosis not present

## 2021-07-31 DIAGNOSIS — Z89512 Acquired absence of left leg below knee: Secondary | ICD-10-CM | POA: Insufficient documentation

## 2021-07-31 DIAGNOSIS — Z96642 Presence of left artificial hip joint: Secondary | ICD-10-CM | POA: Diagnosis not present

## 2021-07-31 DIAGNOSIS — M1611 Unilateral primary osteoarthritis, right hip: Secondary | ICD-10-CM | POA: Insufficient documentation

## 2021-07-31 DIAGNOSIS — Z96641 Presence of right artificial hip joint: Secondary | ICD-10-CM | POA: Diagnosis not present

## 2021-07-31 DIAGNOSIS — E782 Mixed hyperlipidemia: Secondary | ICD-10-CM | POA: Diagnosis not present

## 2021-07-31 DIAGNOSIS — Z419 Encounter for procedure for purposes other than remedying health state, unspecified: Secondary | ICD-10-CM

## 2021-07-31 DIAGNOSIS — Z87891 Personal history of nicotine dependence: Secondary | ICD-10-CM | POA: Insufficient documentation

## 2021-07-31 DIAGNOSIS — Z471 Aftercare following joint replacement surgery: Secondary | ICD-10-CM | POA: Diagnosis not present

## 2021-07-31 HISTORY — PX: TOTAL HIP ARTHROPLASTY: SHX124

## 2021-07-31 LAB — TYPE AND SCREEN
ABO/RH(D): A POS
Antibody Screen: NEGATIVE

## 2021-07-31 LAB — GLUCOSE, CAPILLARY
Glucose-Capillary: 133 mg/dL — ABNORMAL HIGH (ref 70–99)
Glucose-Capillary: 119 mg/dL — ABNORMAL HIGH (ref 70–99)

## 2021-07-31 SURGERY — ARTHROPLASTY, HIP, TOTAL, ANTERIOR APPROACH
Anesthesia: Spinal | Site: Hip | Laterality: Right

## 2021-07-31 MED ORDER — SODIUM CHLORIDE 0.9 % IR SOLN
Status: DC | PRN
  Administered 2021-07-31: 1000 mL

## 2021-07-31 MED ORDER — METHOCARBAMOL 500 MG PO TABS: 500.0000 mg | ORAL_TABLET | Freq: Four times a day (QID) | ORAL | Status: DC | PRN

## 2021-07-31 MED ORDER — KETOROLAC TROMETHAMINE 15 MG/ML IJ SOLN
15.0000 mg | Freq: Four times a day (QID) | INTRAMUSCULAR | Status: DC
  Administered 2021-07-31: 15 mg via INTRAVENOUS

## 2021-07-31 MED ORDER — ONDANSETRON HCL 4 MG/2ML IJ SOLN
INTRAMUSCULAR | Status: DC | PRN
  Administered 2021-07-31: 4 mg via INTRAVENOUS

## 2021-07-31 MED ORDER — LACTATED RINGERS IV BOLUS
250.0000 mL | Freq: Once | INTRAVENOUS | Status: AC
  Administered 2021-07-31: 250 mL via INTRAVENOUS

## 2021-07-31 MED ORDER — FENTANYL CITRATE (PF) 100 MCG/2ML IJ SOLN
INTRAMUSCULAR | Status: DC | PRN
  Administered 2021-07-31 (×2): 50 ug via INTRAVENOUS

## 2021-07-31 MED ORDER — PROPOFOL 1000 MG/100ML IV EMUL
INTRAVENOUS | Status: AC
  Filled 2021-07-31: qty 100

## 2021-07-31 MED ORDER — LACTATED RINGERS IV SOLN: INTRAVENOUS | Status: DC

## 2021-07-31 MED ORDER — PHENYLEPHRINE 40 MCG/ML (10ML) SYRINGE FOR IV PUSH (FOR BLOOD PRESSURE SUPPORT)
PREFILLED_SYRINGE | INTRAVENOUS | Status: DC | PRN
  Administered 2021-07-31 (×2): 80 ug via INTRAVENOUS

## 2021-07-31 MED ORDER — PHENYLEPHRINE 40 MCG/ML (10ML) SYRINGE FOR IV PUSH (FOR BLOOD PRESSURE SUPPORT)
PREFILLED_SYRINGE | INTRAVENOUS | Status: AC
  Filled 2021-07-31: qty 10

## 2021-07-31 MED ORDER — FENTANYL CITRATE (PF) 100 MCG/2ML IJ SOLN
INTRAMUSCULAR | Status: AC
  Filled 2021-07-31: qty 2

## 2021-07-31 MED ORDER — OXYCODONE HCL 5 MG/5ML PO SOLN: 5.0000 mg | Freq: Once | ORAL | Status: AC | PRN

## 2021-07-31 MED ORDER — EPHEDRINE 5 MG/ML INJ
INTRAVENOUS | Status: AC
  Filled 2021-07-31: qty 5

## 2021-07-31 MED ORDER — METHOCARBAMOL 500 MG PO TABS: 500.0000 mg | ORAL_TABLET | Freq: Four times a day (QID) | ORAL | 0 refills | Status: AC | PRN

## 2021-07-31 MED ORDER — OXYCODONE HCL 5 MG PO TABS
5.0000 mg | ORAL_TABLET | Freq: Once | ORAL | Status: AC | PRN
  Administered 2021-07-31: 5 mg via ORAL

## 2021-07-31 MED ORDER — MIDAZOLAM HCL 2 MG/2ML IJ SOLN
INTRAMUSCULAR | Status: AC
  Filled 2021-07-31: qty 2

## 2021-07-31 MED ORDER — DEXAMETHASONE SODIUM PHOSPHATE 10 MG/ML IJ SOLN
INTRAMUSCULAR | Status: AC
  Filled 2021-07-31: qty 1

## 2021-07-31 MED ORDER — METHOCARBAMOL 500 MG IVPB - SIMPLE MED
INTRAVENOUS | Status: AC
  Filled 2021-07-31: qty 50

## 2021-07-31 MED ORDER — LACTATED RINGERS IV BOLUS
500.0000 mL | Freq: Once | INTRAVENOUS | Status: AC
  Administered 2021-07-31: 500 mL via INTRAVENOUS

## 2021-07-31 MED ORDER — POVIDONE-IODINE 10 % EX SWAB
2.0000 "application " | Freq: Once | CUTANEOUS | Status: AC
  Administered 2021-07-31: 2 via TOPICAL

## 2021-07-31 MED ORDER — ACETAMINOPHEN 10 MG/ML IV SOLN: 1000.0000 mg | Freq: Once | INTRAVENOUS | Status: DC | PRN

## 2021-07-31 MED ORDER — BUPIVACAINE IN DEXTROSE 0.75-8.25 % IT SOLN
INTRATHECAL | Status: DC | PRN
  Administered 2021-07-31: 1.7 mL via INTRATHECAL

## 2021-07-31 MED ORDER — PHENYLEPHRINE HCL-NACL 20-0.9 MG/250ML-% IV SOLN
INTRAVENOUS | Status: DC | PRN
  Administered 2021-07-31: 25 ug/min via INTRAVENOUS

## 2021-07-31 MED ORDER — DEXAMETHASONE SODIUM PHOSPHATE 10 MG/ML IJ SOLN
INTRAMUSCULAR | Status: DC | PRN
  Administered 2021-07-31: 8 mg via INTRAVENOUS

## 2021-07-31 MED ORDER — OXYCODONE HCL 5 MG PO TABS: 5.0000 mg | ORAL_TABLET | ORAL | 0 refills | Status: DC | PRN

## 2021-07-31 MED ORDER — EPHEDRINE SULFATE-NACL 50-0.9 MG/10ML-% IV SOSY
PREFILLED_SYRINGE | INTRAVENOUS | Status: DC | PRN
  Administered 2021-07-31: 10 mg via INTRAVENOUS

## 2021-07-31 MED ORDER — AMISULPRIDE (ANTIEMETIC) 5 MG/2ML IV SOLN: 10.0000 mg | Freq: Once | INTRAVENOUS | Status: DC | PRN

## 2021-07-31 MED ORDER — CEFAZOLIN SODIUM-DEXTROSE 2-4 GM/100ML-% IV SOLN
2.0000 g | INTRAVENOUS | Status: AC
  Administered 2021-07-31: 2 g via INTRAVENOUS
  Filled 2021-07-31: qty 100

## 2021-07-31 MED ORDER — ORAL CARE MOUTH RINSE: 15.0000 mL | Freq: Once | OROMUCOSAL | Status: AC

## 2021-07-31 MED ORDER — KETOROLAC TROMETHAMINE 15 MG/ML IJ SOLN
INTRAMUSCULAR | Status: AC
  Filled 2021-07-31: qty 1

## 2021-07-31 MED ORDER — METHOCARBAMOL 500 MG IVPB - SIMPLE MED
500.0000 mg | Freq: Four times a day (QID) | INTRAVENOUS | Status: DC | PRN
  Administered 2021-07-31: 500 mg via INTRAVENOUS

## 2021-07-31 MED ORDER — TRANEXAMIC ACID-NACL 1000-0.7 MG/100ML-% IV SOLN
1000.0000 mg | INTRAVENOUS | Status: AC
  Administered 2021-07-31: 1000 mg via INTRAVENOUS
  Filled 2021-07-31: qty 100

## 2021-07-31 MED ORDER — ONDANSETRON HCL 4 MG/2ML IJ SOLN
INTRAMUSCULAR | Status: AC
  Filled 2021-07-31: qty 2

## 2021-07-31 MED ORDER — PROPOFOL 10 MG/ML IV BOLUS
INTRAVENOUS | Status: DC | PRN
  Administered 2021-07-31: 20 mg via INTRAVENOUS

## 2021-07-31 MED ORDER — HYDROMORPHONE HCL 1 MG/ML IJ SOLN: 0.2500 mg | INTRAMUSCULAR | Status: DC | PRN

## 2021-07-31 MED ORDER — 0.9 % SODIUM CHLORIDE (POUR BTL) OPTIME
TOPICAL | Status: DC | PRN
  Administered 2021-07-31: 1000 mL

## 2021-07-31 MED ORDER — ONDANSETRON HCL 4 MG/2ML IJ SOLN: 4.0000 mg | Freq: Once | INTRAMUSCULAR | Status: DC | PRN

## 2021-07-31 MED ORDER — CHLORHEXIDINE GLUCONATE 0.12 % MT SOLN
15.0000 mL | Freq: Once | OROMUCOSAL | Status: AC
  Administered 2021-07-31: 15 mL via OROMUCOSAL

## 2021-07-31 MED ORDER — PROPOFOL 500 MG/50ML IV EMUL
INTRAVENOUS | Status: DC | PRN
  Administered 2021-07-31: 100 ug/kg/min via INTRAVENOUS

## 2021-07-31 MED ORDER — OXYCODONE HCL 5 MG PO TABS
ORAL_TABLET | ORAL | Status: AC
  Filled 2021-07-31: qty 1

## 2021-07-31 MED ORDER — MIDAZOLAM HCL 5 MG/5ML IJ SOLN
INTRAMUSCULAR | Status: DC | PRN
  Administered 2021-07-31: 2 mg via INTRAVENOUS

## 2021-07-31 MED ORDER — CEFAZOLIN SODIUM-DEXTROSE 1-4 GM/50ML-% IV SOLN: 1.0000 g | Freq: Once | INTRAVENOUS | Status: DC

## 2021-07-31 SURGICAL SUPPLY — 42 items
BAG COUNTER SPONGE SURGICOUNT (BAG) ×2 IMPLANT
BAG ZIPLOCK 12X15 (MISCELLANEOUS) IMPLANT
BENZOIN TINCTURE PRP APPL 2/3 (GAUZE/BANDAGES/DRESSINGS) IMPLANT
BLADE SAW SGTL 18X1.27X75 (BLADE) ×2 IMPLANT
BNDG COHESIVE 6X5 TAN ST LF (GAUZE/BANDAGES/DRESSINGS) ×2 IMPLANT
COVER PERINEAL POST (MISCELLANEOUS) ×2 IMPLANT
COVER SURGICAL LIGHT HANDLE (MISCELLANEOUS) ×2 IMPLANT
CUP SECTOR GRIPTON 58MM (Orthopedic Implant) ×2 IMPLANT
DRAPE FOOT SWITCH (DRAPES) ×2 IMPLANT
DRAPE STERI IOBAN 125X83 (DRAPES) ×2 IMPLANT
DRAPE U-SHAPE 47X51 STRL (DRAPES) ×4 IMPLANT
DRSG AQUACEL AG ADV 3.5X10 (GAUZE/BANDAGES/DRESSINGS) ×2 IMPLANT
DURAPREP 26ML APPLICATOR (WOUND CARE) ×2 IMPLANT
ELECT REM PT RETURN 15FT ADLT (MISCELLANEOUS) ×2 IMPLANT
GAUZE XEROFORM 1X8 LF (GAUZE/BANDAGES/DRESSINGS) ×2 IMPLANT
GLOVE SRG 8 PF TXTR STRL LF DI (GLOVE) ×2 IMPLANT
GLOVE SURG ENC MOIS LTX SZ7.5 (GLOVE) ×2 IMPLANT
GLOVE SURG NEOPR MICRO LF SZ8 (GLOVE) ×2 IMPLANT
GLOVE SURG UNDER POLY LF SZ8 (GLOVE) ×2
GOWN STRL REUS W/TWL XL LVL3 (GOWN DISPOSABLE) ×4 IMPLANT
HANDPIECE INTERPULSE COAX TIP (DISPOSABLE) ×1
HEAD M SROM 36MM PLUS 1.5 (Hips) ×1 IMPLANT
HOLDER FOLEY CATH W/STRAP (MISCELLANEOUS) IMPLANT
KIT TURNOVER KIT A (KITS) IMPLANT
LINER NEUTRAL 36X58 PLUS4 ×2 IMPLANT
PACK ANTERIOR HIP CUSTOM (KITS) ×2 IMPLANT
PENCIL SMOKE EVACUATOR (MISCELLANEOUS) IMPLANT
SET HNDPC FAN SPRY TIP SCT (DISPOSABLE) ×1 IMPLANT
SPONGE T-LAP 18X18 ~~LOC~~+RFID (SPONGE) ×4 IMPLANT
SROM M HEAD 36MM PLUS 1.5 (Hips) ×2 IMPLANT
STAPLER VISISTAT 35W (STAPLE) ×2 IMPLANT
STEM AMT HIGH CORAIL SZ13X135 (Stem) ×2 IMPLANT
STRIP CLOSURE SKIN 1/2X4 (GAUZE/BANDAGES/DRESSINGS) IMPLANT
SUT ETHIBOND NAB CT1 #1 30IN (SUTURE) ×2 IMPLANT
SUT ETHILON 2 0 PS N (SUTURE) IMPLANT
SUT MNCRL AB 4-0 PS2 18 (SUTURE) IMPLANT
SUT VIC AB 0 CT1 36 (SUTURE) ×2 IMPLANT
SUT VIC AB 1 CT1 36 (SUTURE) ×2 IMPLANT
SUT VIC AB 2-0 CT1 27 (SUTURE) ×2
SUT VIC AB 2-0 CT1 TAPERPNT 27 (SUTURE) ×2 IMPLANT
TRAY FOLEY MTR SLVR 16FR STAT (SET/KITS/TRAYS/PACK) ×2 IMPLANT
TUBE SUCTION HIGH CAP CLEAR NV (SUCTIONS) ×2 IMPLANT

## 2021-07-31 NOTE — Anesthesia Postprocedure Evaluation (Signed)
Anesthesia Post Note  Patient: Anthony Park  Procedure(s) Performed: RIGHT TOTAL HIP ARTHROPLASTY ANTERIOR APPROACH (Right: Hip)     Patient location during evaluation: Nursing Unit Anesthesia Type: Spinal Level of consciousness: oriented and awake and alert Pain management: pain level controlled Vital Signs Assessment: post-procedure vital signs reviewed and stable Respiratory status: spontaneous breathing and respiratory function stable Cardiovascular status: blood pressure returned to baseline and stable Postop Assessment: no headache, no backache, no apparent nausea or vomiting and patient able to bend at knees Anesthetic complications: no   No notable events documented.  Last Vitals:  Vitals:   07/31/21 1145 07/31/21 1200  BP: 90/67   Pulse: 67   Resp: 17 18  Temp:    SpO2: 98%     Last Pain:  Vitals:   07/31/21 1145  TempSrc:   PainSc: 0-No pain                 Trevor Iha

## 2021-07-31 NOTE — Anesthesia Procedure Notes (Signed)
Procedure Name: MAC Date/Time: 07/31/2021 9:49 AM Performed by: West Pugh, CRNA Pre-anesthesia Checklist: Patient identified, Emergency Drugs available, Suction available, Patient being monitored and Timeout performed Patient Re-evaluated:Patient Re-evaluated prior to induction Oxygen Delivery Method: Simple face mask Preoxygenation: Pre-oxygenation with 100% oxygen Induction Type: IV induction Placement Confirmation: positive ETCO2 Dental Injury: Teeth and Oropharynx as per pre-operative assessment

## 2021-07-31 NOTE — Anesthesia Procedure Notes (Signed)
Spinal  Patient location during procedure: OR Start time: 07/31/2021 9:51 AM End time: 07/31/2021 9:58 AM Reason for block: surgical anesthesia Staffing Performed: resident/CRNA  Anesthesiologist: Barnet Glasgow, MD Resident/CRNA: West Pugh, CRNA Preanesthetic Checklist Completed: patient identified, IV checked, site marked, risks and benefits discussed, surgical consent, monitors and equipment checked, pre-op evaluation and timeout performed Spinal Block Patient position: sitting Prep: DuraPrep and site prepped and draped Patient monitoring: heart rate, continuous pulse ox and blood pressure Approach: midline Location: L2-3 Injection technique: single-shot Needle Needle type: Pencan and Introducer  Needle gauge: 24 G Needle length: 10 cm Assessment Sensory level: T6 Events: CSF return Additional Notes IV functioning, monitors applied to pt. Expiration date of kit checked and confirmed to be in date. Sterile prep and drape, hand hygiene and sterile gloved used. Pt was positioned and spine was prepped in sterile fashion. Skin was anesthetized with lidocaine. Free flow of clear CSF obtained prior to injecting local anesthetic into CS. Spinal needle aspirated freely following injection. Needle was carefully withdrawn, and pt tolerated procedure well. Loss of motor and sensory on exam post injection. Dr Valma Cava present for procedure.

## 2021-07-31 NOTE — Evaluation (Signed)
Physical Therapy Evaluation Patient Details Name: Anthony Park MRN: [id number] DOB: 1961/07/12 Today's Date: 07/31/2021  History of Present Illness  Patient is 60 y.o. male s/p Rt THA anterior approach on 07/31/21 with PMH significant for HTN, HLD, DM, OA, PVD, Rt toe amputations, Lt BKA 10/03/20,, Lt THA on 05/23/20.    Clinical Impression  Anthony Park is a 60 y.o. male POD 0 s/p Rt THA. Patient reports independence with mobility at baseline. Patient is now limited by functional impairments (see PT problem list below) and requires min guard/supervision for transfers and gait with RW. Patient was able to ambulate ~80 feet with RW and min guard/supervision and cues for safe walker management. Patient instructed in exercises to facilitate ROM and circulation. Patient will benefit from continued skilled PT interventions to address impairments and progress towards PLOF. Patient has met mobility goals at adequate level for discharge home; will continue to follow if pt continues acute stay to progress towards Mod I goals.        Recommendations for follow up therapy are one component of a multi-disciplinary discharge planning process, led by the attending physician.  Recommendations may be updated based on patient status, additional functional criteria and insurance authorization.  Follow Up Recommendations Follow physician's recommendations for discharge plan and follow up therapies    Assistance Recommended at Discharge Intermittent Supervision/Assistance  Functional Status Assessment Patient has had a recent decline in their functional status and demonstrates the ability to make significant improvements in function in a reasonable and predictable amount of time.  Equipment Recommendations       Recommendations for Other Services       Precautions / Restrictions Precautions Precautions: Fall Precaution Comments: Lt BKA Restrictions Weight Bearing Restrictions: No Other  Position/Activity Restrictions: WBAT      Mobility  Bed Mobility Overal bed mobility: Needs Assistance Bed Mobility: Supine to Sit     Supine to sit: HOB elevated;Min guard;Supervision     General bed mobility comments: cues to bring LE's off EOB and pt taking extra time, no assist needed.    Transfers Overall transfer level: Needs assistance Equipment used: Rolling walker (2 wheels) Transfers: Sit to/from Stand Sit to Stand: Supervision;Min guard           General transfer comment: guard/supervision with cues for hand placement on RW, no assist needed and pt steady with rise.    Ambulation/Gait Ambulation/Gait assistance: Min guard;Supervision Gait Distance (Feet): 80 Feet Assistive device: Rolling walker (2 wheels) Gait Pattern/deviations: Step-to pattern;Decreased stride length Gait velocity: decr     General Gait Details: cues for safe step to pattern. pt maintained safe position to RW with no LOB throughout.  Stairs            Wheelchair Mobility    Modified Rankin (Stroke Patients Only)       Balance Overall balance assessment: Needs assistance;Mild deficits observed, not formally tested Sitting-balance support: Feet supported Sitting balance-Leahy Scale: Good     Standing balance support: Reliant on assistive device for balance;During functional activity;Bilateral upper extremity supported Standing balance-Leahy Scale: Poor                               Pertinent Vitals/Pain Pain Assessment: 0-10 Pain Score: 6  Pain Location: Rt hip Pain Descriptors / Indicators: Aching;Discomfort;Grimacing;Guarding Pain Intervention(s): Limited activity within patient's tolerance;Monitored during session;Repositioned;Patient requesting pain meds-RN notified;Ice applied    Home Living Family/patient expects to be  discharged to:: Private residence Living Arrangements: Spouse/significant other Available Help at Discharge: Family;Available 24  hours/day Type of Home: House Home Access: Ramped entrance       Home Layout: One level Home Equipment: Agricultural consultant (2 wheels);Cane - single point;Crutches;BSC/3in1;Shower seat;Wheelchair - manual Additional Comments: Wife works from home, has flexible job and can assist pt as needed    Prior Function Prior Level of Function : Independent/Modified Independent             Mobility Comments: pt recovered from Lt BKA surgery in Jan 2022. Recently graduated from therapy. pt was still using [facility] for ambulation PTA for this surgery.       Hand Dominance   Dominant Hand: Right    Extremity/Trunk Assessment   Upper Extremity Assessment Upper Extremity Assessment: Overall WFL for tasks assessed    Lower Extremity Assessment Lower Extremity Assessment: RLE deficits/detail;LLE deficits/detail RLE Deficits / Details: good quad activation, limited by hip pain RLE: Unable to fully assess due to pain RLE Sensation: WNL RLE Coordination: WNL LLE Deficits / Details: Lt AKA pt with good balance and independent for doffing/donning prosthesis LLE Sensation: WNL LLE Coordination: WNL    Cervical / Trunk Assessment Cervical / Trunk Assessment: Normal  Communication   Communication: No difficulties  Cognition Arousal/Alertness: Awake/alert Behavior During Therapy: WFL for tasks assessed/performed Overall Cognitive Status: Within Functional Limits for tasks assessed                                          General Comments      Exercises     Assessment/Plan    PT Assessment Patient needs continued PT services  PT Problem List Decreased strength;Decreased range of motion;Decreased activity tolerance;Decreased balance;Decreased mobility;Decreased knowledge of use of DME;Decreased safety awareness;Decreased knowledge of precautions;Pain       PT Treatment Interventions Gait training;DME instruction;Stair training;Functional mobility training;Therapeutic  activities;Balance training;Patient/family education    PT Goals (Current goals can be found in the Care Plan section)  Acute Rehab PT Goals Patient Stated Goal: go home and stop hurting PT Goal Formulation: With patient Time For Goal Achievement: 08/07/21 Potential to Achieve Goals: Good    Frequency 7X/week   Barriers to discharge        Co-evaluation               AM-PAC PT "6 Clicks" Mobility  Outcome Measure Help needed turning from your back to your side while in a flat bed without using bedrails?: None Help needed moving from lying on your back to sitting on the side of a flat bed without using bedrails?: A Little Help needed moving to and from a bed to a chair (including a wheelchair)?: A Little Help needed standing up from a chair using your arms (e.g., wheelchair or bedside chair)?: A Little Help needed to walk in hospital room?: A Little Help needed climbing 3-5 steps with a railing? : A Little 6 Click Score: 19    End of Session Equipment Utilized During Treatment: Gait belt Activity Tolerance: Patient tolerated treatment well;Patient limited by pain Patient left: in chair;with call bell/phone within reach (MD at bedside) Nurse Communication: Mobility status;Patient requests pain meds PT Visit Diagnosis: Other abnormalities of gait and mobility (R26.89);Muscle weakness (generalized) (M62.81);Difficulty in walking, not elsewhere classified (R26.2)    Time: 8899-6498 PT Time Calculation (min) (ACUTE ONLY): 32 min   Charges:  PT Evaluation $PT Eval Low Complexity: 1 Low PT Treatments $Gait Training: 8-22 mins        Verner Mould, DPT Acute Rehabilitation Services Office [phone] Pager [phone]   Jacques Navy 07/31/2021, 4:24 PM

## 2021-07-31 NOTE — Interval H&P Note (Signed)
History and Physical Interval Note: The patient understands that he is here today for a right hip replacement to treat his right hip osteoarthritis.  There has been no acute or interval change in his medical status.  Please see recent H&P.  The risks and benefits of surgery been explained in detail and informed consent is obtained.  The right hip has been marked.  07/31/2021 8:44 AM  Anthony Park  has presented today for surgery, with the diagnosis of osteoarthritis right hip.  The various methods of treatment have been discussed with the patient and family. After consideration of risks, benefits and other options for treatment, the patient has consented to  Procedure(s): RIGHT TOTAL HIP ARTHROPLASTY ANTERIOR APPROACH (Right) as a surgical intervention.  The patient's history has been reviewed, patient examined, no change in status, stable for surgery.  I have reviewed the patient's chart and labs.  Questions were answered to the patient's satisfaction.     Kathryne Hitch

## 2021-07-31 NOTE — Discharge Instructions (Signed)

## 2021-07-31 NOTE — Brief Op Note (Signed)
07/31/2021  10:59 AM  PATIENT:  Anthony Park  60 y.o. male  PRE-OPERATIVE DIAGNOSIS:  osteoarthritis right hip  POST-OPERATIVE DIAGNOSIS:  osteoarthritis right hip  PROCEDURE:  Procedure(s): RIGHT TOTAL HIP ARTHROPLASTY ANTERIOR APPROACH (Right)  SURGEON:  Surgeon(s) and Role:    Kathryne Hitch, MD - Primary  PHYSICIAN ASSISTANT:  Rexene Edison, PA-C  ANESTHESIA:   spinal  EBL:  100 mL   COUNTS:  YES  DICTATION: .Other Dictation: Dictation Number [id number]  PLAN OF CARE: Discharge to home after PACU  PATIENT DISPOSITION:  PACU - hemodynamically stable.   Delay start of Pharmacological VTE agent (>24hrs) due to surgical blood loss or risk of bleeding: no

## 2021-07-31 NOTE — Transfer of Care (Signed)
Immediate Anesthesia Transfer of Care Note  Patient: Anthony Park  Procedure(s) Performed: RIGHT TOTAL HIP ARTHROPLASTY ANTERIOR APPROACH (Right: Hip)  Patient Location: PACU  Anesthesia Type:MAC and Spinal  Level of Consciousness: awake, drowsy and patient cooperative  Airway & Oxygen Therapy: Patient Spontanous Breathing and Patient connected to face mask oxygen  Post-op Assessment: Report given to RN and Post -op Vital signs reviewed and stable  Post vital signs: Reviewed and stable  Last Vitals:  Vitals Value Taken Time  BP 99/56 07/31/21 1110  Temp    Pulse    Resp 12 07/31/21 1112  SpO2    Vitals shown include unvalidated device data.  Last Pain:  Vitals:   07/31/21 0804  TempSrc:   PainSc: 5       Patients Stated Pain Goal: 4 (64/33/29 5188)  Complications: No notable events documented.

## 2021-08-01 NOTE — Op Note (Signed)
NAME: Anthony Park, Anthony Park MEDICAL RECORD NO: [id number] ACCOUNT NO: [id number] DATE OF BIRTH: April 04, 1961 FACILITY: [facility] LOCATION: [facility] PHYSICIAN: Vanita Panda. Magnus Ivan, MD  Operative Report   DATE OF PROCEDURE: 07/31/2021   PREOPERATIVE DIAGNOSIS:  Primary osteoarthritis and degenerative joint disease, right hip.  POSTOPERATIVE DIAGNOSIS:  Primary osteoarthritis and degenerative joint disease, right hip.  PROCEDURE:  Right total hip arthroplasty through direct anterior approach.  IMPLANTS:  DePuy sector Gription acetabular component, size 58, size 36+4 polyethylene liner, size 13 Corail femoral component with high offset, size 36+1.5 metal hip ball.  SURGEON:  Doneen Poisson, M.D.   ASSISTANT:  Richardean Canal, PA-C.  ANESTHESIA:  Spinal.  ANTIBIOTICS:  2 g IV Ancef.  ESTIMATED BLOOD LOSS:  100 mL  COMPLICATIONS:  None.  INDICATIONS:  The patient is a 60 year old gentleman well known to me.  He actually has a previous left total hip arthroplasty and a left below-knee amputation secondary to peripheral vascular disease and diabetes.  His diabetes is under good control.   He has debilitating arthritis as well documented in his right hip.  At this point, with the failure of conservative treatment and the success of his left total hip arthroplasty he wishes to have this done on the right side as well.  I agree with this as  well based on his clinical exam and x-ray findings.  At this point, his hip pain is detrimentally affecting his mobility, his quality of life and his activities of daily living.  This causes him to put more pressure through his left below-knee amputation  prosthesis.  With surgery, he understands there is risk of acute blood loss anemia, nerve or vessel injury, fracture, infection, dislocation, DVT, implant failure, skin and soft tissue issues and leg length differences.  He understands our goals are to  decrease pain, improve mobility and overall improve  quality of life.  DESCRIPTION OF PROCEDURE:  After informed consent was obtained, appropriate right hip was marked, he was brought to the operating room and sat up on the stretcher.  Spinal anesthesia was obtained, he was laid in supine position on the stretcher and his  right leg was placed in a traction boot in line skeletal traction device and no traction applied.  His left below-knee amputation stump was placed in a well leg holder.  A perineal post was then placed as well with well padding.  We then assessed his  right hip radiographically as well as the pelvis.  His right hip was prepped and draped with DuraPrep and sterile drapes.  A timeout was called and he was identified correct patient, correct right hip.  I then made an incision just inferior and posterior  to the anterior superior iliac spine and carried this obliquely down the leg.  Dissected down tensor fascia lata muscle.  Tensor fascia was then divided longitudinally to proceed with direct anterior approach to the hip.  We identified and cauterized  circumflex vessels and identified the hip capsule, opened the hip capsule in L-type format finding moderate joint effusion.  We placed retractors in the medial and lateral femoral neck capsule and made a femoral neck cut with oscillating saw, proximal to  the lesser trochanter and completed this with the osteotome.  We placed a corkscrew guide in the femoral head and removed the femoral head in its entirety.  There was a very large femoral head and was devoid of cartilage.  We then removed periarticular  osteophytes around the acetabulum as well as remnants of the  acetabulum and other debris.  I placed a bent Hohmann over the medial acetabular rim and then began reaming from a size 43 reamer in a stepwise increments all the way up to a size 57 reamer  with all reamers placed under direct visualization.  Last reamer was placed under direct fluoroscopy, so we could obtain our depth of reaming, our  inclination and anteversion.  I then placed real DePuy sector Gription acetabular component, size 58, we  went with a 36+4 polyethylene liner.  Attention was then turned to the femur.  With the leg externally rotated to 120 degrees and extended and adducted, we were able to place a Mueller retractor medially and Hohman retractor behind the greater  trochanter.  We released lateral joint capsule and used a box cutting osteotome to enter femoral canal and a rongeur to lateralize, then began broaching using the Corail broaching system from a size 8 going up to a size 13.  With the 13 in place, we  trialed a high offset femoral neck and a 36+15 hip ball.  We brought the leg back over and up and with traction, internal rotation reduced the pelvis.  We were pleased with stability of the hip, assessed radiographically and mechanically.  We were  pleased with the offset and leg length as well.  We then dislocated the hip, removed the trial components.  We placed the real high offset Corail femoral component, size 13 and the real 36+15 metal hip ball and again reduced this in the acetabulum and  was stable.  We then irrigated the soft tissue with normal saline solution using pulsatile lavage.  The joint capsule was closed with interrupted #1 Ethibond suture followed by #1 Vicryl to close the tensor fascia.  0 Vicryl was used to close deep tissue  and 2-0 Vicryl was used to close subcutaneous tissue.  The skin was closed with staples.  An Aquacel dressing was applied.  He was taken off the Hana table and taken to recovery room in stable condition with all final counts being correct.  No  complications noted.  Of note, Rexene Edison, PA-C did assist in entire case and his assistance was crucial for facilitating all aspects of this case.   [email or web address] D: 07/31/2021 10:57:54 am T: 08/01/2021 1:00:00 am  JOB: [id number]/ [id number]

## 2021-08-03 ENCOUNTER — Encounter (HOSPITAL_COMMUNITY): Payer: Self-pay | Admitting: Orthopaedic Surgery

## 2021-08-13 ENCOUNTER — Ambulatory Visit (INDEPENDENT_AMBULATORY_CARE_PROVIDER_SITE_OTHER): Payer: BC Managed Care – PPO | Admitting: Physician Assistant

## 2021-08-13 ENCOUNTER — Encounter: Payer: Self-pay | Admitting: Physician Assistant

## 2021-08-13 DIAGNOSIS — Z96641 Presence of right artificial hip joint: Secondary | ICD-10-CM

## 2021-08-13 MED ORDER — OXYCODONE HCL 5 MG PO TABS
5.0000 mg | ORAL_TABLET | ORAL | 0 refills | Status: DC | PRN
Start: 2021-08-13 — End: 2021-09-02

## 2021-08-13 NOTE — Progress Notes (Signed)
HPI: Anthony Park returns today status post right total hip arthroplasty 07/1821.  He states he really has not been donning his left leg prosthesis since surgery just because he is been very so well.  He is had no fevers chills.  Physical exam: Right hip surgical incisions healing well.  Staples well approximate the incision.  Slight seroma.  Seroma was aspirated 20 cc of serosanguineous fluid obtained patient tolerates well.  Right calf supple nontender dorsiflexion plantarflexion right ankle intact.  Impression: Status post right total hip arthroplasty 07/31/2021.  Plan: Staples removed Steri-Strips applied.  Reiterated to fill his Ox[place] codon.  He is on chronic Plavix and aspirin.  He will work on scar tissue mobilization.  We will see him back in 4 weeks see how he is doing overall.  We will send him to formal therapy to assist him in gait and balance.  Questions were encouraged and answered at length.

## 2021-08-13 NOTE — Addendum Note (Signed)
Addended by: Barbette Or on: 08/13/2021 04:40 PM   Modules accepted: Orders

## 2021-08-18 ENCOUNTER — Ambulatory Visit (INDEPENDENT_AMBULATORY_CARE_PROVIDER_SITE_OTHER): Payer: BC Managed Care – PPO | Admitting: Physical Therapy

## 2021-08-18 ENCOUNTER — Encounter: Payer: Self-pay | Admitting: Physical Therapy

## 2021-08-18 ENCOUNTER — Other Ambulatory Visit: Payer: Self-pay

## 2021-08-18 DIAGNOSIS — M25551 Pain in right hip: Secondary | ICD-10-CM | POA: Diagnosis not present

## 2021-08-18 DIAGNOSIS — M25651 Stiffness of right hip, not elsewhere classified: Secondary | ICD-10-CM

## 2021-08-18 DIAGNOSIS — R2689 Other abnormalities of gait and mobility: Secondary | ICD-10-CM | POA: Diagnosis not present

## 2021-08-18 DIAGNOSIS — R2681 Unsteadiness on feet: Secondary | ICD-10-CM | POA: Diagnosis not present

## 2021-08-18 DIAGNOSIS — M6281 Muscle weakness (generalized): Secondary | ICD-10-CM

## 2021-08-18 NOTE — Therapy (Signed)
[facility] [address] [place], [place], [address] Phone: [phone]   Fax:  [phone]  Physical Therapy Evaluation  Patient Details  Name: Anthony Park MRN: [id number] Date of Birth: 11-Jun-1961 Referring Provider (PT): Kriste Basque   Encounter Date: 08/18/2021   PT End of Session - 08/18/21 1512     Visit Number 1    Number of Visits 16    Date for PT Re-Evaluation 10/13/21    Authorization Type [employer] comm ppo    Authorization - Visit Number 26    Authorization - Number of Visits [id number]    PT Start Time 1345    PT Stop Time 1430    PT Time Calculation (min) 45 min    Activity Tolerance Patient tolerated treatment well    Behavior During Therapy WFL for tasks assessed/performed             Past Medical History:  Diagnosis Date   Arthritis    Diabetes mellitus without complication (HCC)    type 2 controlled with diet    Diabetic toe ulcer (HCC) 11/05/2018   History of kidney stones    passed 1 several years ago   Hyperlipidemia    Hypertension    not on medications   Peripheral vascular disease (HCC)    stent in left leg due to anerysm     Past Surgical History:  Procedure Laterality Date    Left Transmetatarsal Ampuation (Left Foot)  08/20/2020   ABDOMINAL AORTOGRAM W/LOWER EXTREMITY N/A 08/06/2020   Procedure: ABDOMINAL AORTOGRAM W/LOWER EXTREMITY;  Surgeon: [name], MD;  Location: [facility];  Service: Cardiovascular;  Laterality: N/A;   AMPUTATION Right 11/07/2018   Procedure: RIGHT 3RD TOE AMPUTATION;  Surgeon: Nadara Mustard, MD;  Location: [facility] OR;  Service: Orthopedics;  Laterality: Right;   AMPUTATION Left 08/20/2020   Procedure: Left Transmetatarsal Ampuation;  Surgeon: [name], MD;  Location: [facility] OR;  Service: Vascular;  Laterality: Left;   AMPUTATION Left 10/03/2020   Procedure: LEFT BELOW KNEE AMPUTATION;  Surgeon: [name], MD;  Location: [facility] OR;  Service: Vascular;   Laterality: Left;   AMPUTATION TOE Left    big toe and one next to it   CATARACT EXTRACTION Bilateral    EYE SURGERY Bilateral    cataracts removed   INSERTION OF ILIAC STENT Left    PERIPHERAL VASCULAR BALLOON ANGIOPLASTY Left 08/06/2020   Procedure: PERIPHERAL VASCULAR BALLOON ANGIOPLASTY;  Surgeon: [name], MD;  Location: [facility];  Service: Cardiovascular;  Laterality: Left;  Peroneal artery.   TOE AMPUTATION Left 2015   TOTAL HIP ARTHROPLASTY Left 05/23/2020   Procedure: LEFT TOTAL HIP ARTHROPLASTY ANTERIOR APPROACH;  Surgeon: Kathryne Hitch, MD;  Location: [facility];  Service: Orthopedics;  Laterality: Left;   TOTAL HIP ARTHROPLASTY Right 07/31/2021   Procedure: RIGHT TOTAL HIP ARTHROPLASTY ANTERIOR APPROACH;  Surgeon: Kathryne Hitch, MD;  Location: [facility];  Service: Orthopedics;  Laterality: Right;   WOUND DEBRIDEMENT Left 09/11/2020   Procedure: DEBRIDEMENT OF LEFT TRANSMETATARSAL WOUND;  Surgeon: Leonie Douglas, MD;  Location: [facility] OR;  Service: Vascular;  Laterality: Left;    There were no vitals filed for this visit.    Subjective Assessment - 08/18/21 1342     Subjective He was referred back to PT S/P R THA on 07/31/21. We had been working with him previously due to Left TTA for prosthethic training but this was limited by his  Currently in Pain? Yes    Pain Score 4     Pain Location Hip    Pain Orientation Right    Pain Descriptors / Indicators Aching    Pain Type Surgical pain    Pain Radiating Towards to his knee    Pain Onset 1 to 4 weeks ago    Pain Frequency Intermittent    Aggravating Factors  laying on his Rt side    Pain Relieving Factors movement                [facility] PT Assessment - 08/18/21 0001       Assessment   Medical Diagnosis Rt THA (anterior approach)    Referring Provider (PT) Kriste Basque    Onset Date/Surgical Date 07/31/21    Next MD Visit 09/10/21      Prior Function   Level of Independence  Independent with community mobility with device    Leisure makes cheese      Cognition   Overall Cognitive Status Within Functional Limits for tasks assessed      Observation/Other Assessments   Observations incision site looks well healing with no signs of infection    Focus on Therapeutic Outcomes (FOTO)  44% functional, goal 59%      ROM / Strength   AROM / PROM / Strength PROM;Strength      PROM   PROM Assessment Site Hip    Right/Left Hip Right    Right Hip Flexion 110    Right Hip External Rotation  30    Right Hip Internal Rotation  20    Right Hip ABduction 25      Strength   Overall Strength Comments Rt hip strength 4/5 grossly, Rt knee strength 5/5      Flexibility   Soft Tissue Assessment /Muscle Length --   tight hip flexors and hip rotators bilat     Transfers   Sit to Stand 6: Modified independent (Device/Increase time);With upper extremity assist;From chair/3-in-1    Stand to Sit 6: Modified independent (Device/Increase time);With upper extremity assist;To chair/3-in-1      Ambulation/Gait   Ambulation/Gait Yes    Ambulation/Gait Assistance 6: Modified independent (Device/Increase time)    Ambulation Distance (Feet) 100 Feet   100 feet with Rollator progressed to 100 feet with SPC with SBA   Assistive device Straight cane   and rollator   Gait Pattern Step-through pattern;Decreased stance time - right;Antalgic;Lateral hip instability                        Objective measurements completed on examination: See above findings.       [facility] Adult PT Treatment/Exercise - 08/18/21 0001       Exercises   Exercises Knee/Hip      Knee/Hip Exercises: Stretches   Hip Flexor Stretch Right;3 reps;20 seconds    Hip Flexor Stretch Limitations in standing with UE support at counter stepping Left leg fwd      Knee/Hip Exercises: Aerobic   Nustep Seat 12 level 9 with  BLEs for 8 min      Knee/Hip Exercises: Standing   Hip Flexion Right;10 reps     Hip Flexion Limitations red    Hip Abduction Right;10 reps    Abduction Limitations red   O         Other standing: squats with bilat UE support at sink with chair behind him squatting down as low as he is comfortable X 10  reps         PT Education - 08/18/21 1512     Education Details HEP, POC    Person(s) Educated Patient    Methods Explanation;Demonstration;Verbal cues   he denies needing handout for these   Comprehension Verbalized understanding              PT Short Term Goals - 08/18/21 1615       PT SHORT TERM GOAL #1   Title He will be I and compliant with HEP    Time 4    Period Weeks    Status New    Target Date 09/15/21      PT SHORT TERM GOAL #2   Title He will be able to ambulate 200 feet with SPC mod I to supervision    Time 4    Period Weeks    Status New    Target Date 09/15/21      PT SHORT TERM GOAL #3   Title -      PT SHORT TERM GOAL #4   Title -           d    PT Long Term Goals - 08/18/21 1605       PT LONG TERM GOAL #1   Title He will improve FOTO functional score to 59%    Time 8    Period Weeks    Status New    Target Date 10/13/21      PT LONG TERM GOAL #2   Title Patient will improve Rt hip strength to 5/5 MMT all planes for functional activity    Baseline 4/5    Time 8    Period Weeks    Status New    Target Date 10/13/21      PT LONG TERM GOAL #3   Title Berg Balance >/= 43/56 to indicate lower fall risk    Baseline Berg 39/56    Time 8    Period Weeks    Status New      PT LONG TERM GOAL #4   Title Patient ambulates 300' with SPC to no AD    Baseline can do this with rollator    Time 8    Period Weeks    Status New      PT LONG TERM GOAL #5   Title Patient will improve Rt hip pain to overall less than 3/10 with ususal activity    Baseline 4 today, can still get up to 10    Time 8    Period Weeks    Status New    Target Date 10/13/21                    Plan - 08/18/21 1514      Clinical Impression Statement Pt presents with Rt hip pain, tightness, and weakness S/P Rt THA (anterior approach) on 07/31/21. He also has gait instability due to Lt TTA with proshesis. His Rt hip is doing quite well to this point post op. He will benefit from skilled PT to address his funcitonal deficits listed below    Personal Factors and Comorbidities Comorbidity 3+;Fitness;Time since onset of injury/illness/exacerbation;Past/Current Experience    Comorbidities RT THA, Left TTA, left THA (05/23/20), arthritis, DM2, HTN, HLD, PVD    Examination-Activity Limitations Locomotion Level;Squat;Stairs;Stand;Transfers;Lift;Sleep    Examination-Participation Restrictions Community Activity;Occupation;Driving;Shop    Stability/Clinical Decision Making Evolving/Moderate complexity    Clinical Decision Making Moderate    Rehab Potential Good  PT Frequency 2x / week    PT Duration 8 weeks    PT Treatment/Interventions ADLs/Self Care Home Management;DME Instruction;Gait training;Stair training;Functional mobility training;Therapeutic activities;Therapeutic exercise;Balance training;Neuromuscular re-education;Patient/family education;Prosthetic Training;Vestibular;Dry needling;Manual techniques;Electrical Stimulation;Cryotherapy;Ultrasound;Passive range of motion;Joint Manipulations    PT Next Visit Plan needs Rt hip ROM and strengthening, gait and balance    PT Home Exercise Plan squats at counter top with UE support and chair behind him, standing hip flexion and abd with red band, standing hip flexor stretch    Consulted and Agree with Plan of Care Patient             Patient will benefit from skilled therapeutic intervention in order to improve the following deficits and impairments:  Abnormal gait, Decreased activity tolerance, Decreased balance, Decreased endurance, Decreased knowledge of use of DME, Decreased mobility, Decreased range of motion, Decreased skin integrity, Decreased strength,  Increased edema, Postural dysfunction, Prosthetic Dependency, Pain, Impaired flexibility  Visit Diagnosis: Pain in right hip  Stiffness of right hip, not elsewhere classified  Other abnormalities of gait and mobility  Unsteadiness on feet  Muscle weakness (generalized)     Problem List Patient Active Problem List   Diagnosis Date Noted   S/P BKA (below knee amputation) unilateral, left (HCC) 10/03/2020   Non-healing wound of lower extremity 10/03/2020   History of amputation of left foot through metatarsal bone (HCC) 09/11/2020   PAD (peripheral artery disease) (HCC) 08/20/2020   Status post total replacement of left hip 06/05/2020   Unilateral primary osteoarthritis, right hip 04/17/2020   Unilateral primary osteoarthritis, left hip 04/17/2020   Amputated toe, right (HCC) 11/22/2018   Type 2 diabetes mellitus with foot ulcer, without long-term current use of insulin (HCC) 11/22/2018   Essential hypertension 11/22/2018   Mixed hyperlipidemia 11/22/2018   Morbid obesity (HCC) 11/22/2018   Aneurysm of left popliteal artery (HCC) 11/22/2018   Status post peripheral artery angioplasty with insertion of stent 11/22/2018   Encounter for smoking cessation counseling 11/22/2018   Osteomyelitis of third toe of right foot (HCC)    Cellulitis of third toe of right foot     April Manson, PT,DPT 08/18/2021, 4:18 PM  [facility] [address] [place], [place], [address] Phone: [phone]   Fax:  [phone]  Name: DEVERE BREM MRN: [id number] Date of Birth: [birth year]

## 2021-08-19 ENCOUNTER — Ambulatory Visit (INDEPENDENT_AMBULATORY_CARE_PROVIDER_SITE_OTHER): Payer: BC Managed Care – PPO | Admitting: Physical Therapy

## 2021-08-19 DIAGNOSIS — R2689 Other abnormalities of gait and mobility: Secondary | ICD-10-CM

## 2021-08-19 DIAGNOSIS — M25551 Pain in right hip: Secondary | ICD-10-CM

## 2021-08-19 DIAGNOSIS — M6281 Muscle weakness (generalized): Secondary | ICD-10-CM

## 2021-08-19 DIAGNOSIS — R2681 Unsteadiness on feet: Secondary | ICD-10-CM

## 2021-08-19 DIAGNOSIS — M25651 Stiffness of right hip, not elsewhere classified: Secondary | ICD-10-CM

## 2021-08-19 NOTE — Therapy (Signed)
[facility] Physical Therapy [address] [place], [place], [address] Phone: [phone]   Fax:  [phone]  Physical Therapy Treatment  Patient Details  Name: Anthony Park MRN: [id number] Date of Birth: 1961-02-13 Referring Provider (PT): Kriste Basque   Encounter Date: 08/19/2021   PT End of Session - 08/19/21 1551     Visit Number 2    Number of Visits 16    Date for PT Re-Evaluation 10/13/21    Authorization Type BC[employer] comm ppo    Authorization - Visit Number 27    Authorization - Number of Visits 116    PT Start Time 1430    PT Stop Time 1515    PT Time Calculation (min) 45 min    Activity Tolerance Patient tolerated treatment well    Behavior During Therapy [facility] for tasks assessed/performed             Past Medical History:  Diagnosis Date   Arthritis    Diabetes mellitus without complication (HCC)    type 2 controlled with diet    Diabetic toe ulcer (HCC) 11/05/2018   History of kidney stones    passed 1 several years ago   Hyperlipidemia    Hypertension    not on medications   Peripheral vascular disease (HCC)    stent in left leg due to anerysm     Past Surgical History:  Procedure Laterality Date    Left Transmetatarsal Ampuation (Left Foot)  08/20/2020   ABDOMINAL AORTOGRAM W/LOWER EXTREMITY N/A 08/06/2020   Procedure: ABDOMINAL AORTOGRAM W/LOWER EXTREMITY;  Surgeon: [name], MD;  [facility]: [facility];  Service: Cardiovascular;  Laterality: N/A;   AMPUTATION Right 11/07/2018   Procedure: RIGHT 3RD TOE AMPUTATION;  Surgeon: Nadara Mustard, MD;  Location: [facility] OR;  Service: Orthopedics;  Laterality: Right;   AMPUTATION Left 08/20/2020   Procedure: Left Transmetatarsal Ampuation;  Surgeon: [name], MD;  Location: [facility] OR;  Service: Vascular;  Laterality: Left;   AMPUTATION Left 10/03/2020   Procedure: LEFT BELOW KNEE AMPUTATION;  Surgeon: [name], MD;  Location: [facility] OR;  Service: Vascular;   Laterality: Left;   AMPUTATION TOE Left    big toe and one next to it   CATARACT EXTRACTION Bilateral    EYE SURGERY Bilateral    cataracts removed   INSERTION OF ILIAC STENT Left    PERIPHERAL VASCULAR BALLOON ANGIOPLASTY Left 08/06/2020   Procedure: PERIPHERAL VASCULAR BALLOON ANGIOPLASTY;  Surgeon: [name], MD;  Location: [facility];  Service: Cardiovascular;  Laterality: Left;  Peroneal artery.   TOE AMPUTATION Left 2015   TOTAL HIP ARTHROPLASTY Left 05/23/2020   Procedure: LEFT TOTAL HIP ARTHROPLASTY ANTERIOR APPROACH;  Surgeon: Kathryne Hitch, MD;  Location: [facility] ORS;  Service: Orthopedics;  Laterality: Left;   TOTAL HIP ARTHROPLASTY Right 07/31/2021   Procedure: RIGHT TOTAL HIP ARTHROPLASTY ANTERIOR APPROACH;  Surgeon: Kathryne Hitch, MD;  Location: [facility] ORS;  Service: Orthopedics;  Laterality: Right;   WOUND DEBRIDEMENT Left 09/11/2020   Procedure: DEBRIDEMENT OF LEFT TRANSMETATARSAL WOUND;  Surgeon: Leonie Douglas, MD;  Location: [facility] OR;  Service: Vascular;  Laterality: Left;    There were no vitals filed for this visit.   Subjective Assessment - 08/19/21 1540     Subjective relays tighntess in his Rt anterior hip, more soreness overall in his Rt lateral leg overall    Pertinent History Left TTTA, left THA (05/23/20), arthritis, DM2, HTN, HLD, PVD  Patient Stated Goals Wants to use prosthesis to return to some work, get back in community, be mobile, work in gym in his garage    Currently in Pain? Yes    Pain Score 6     Pain Location Hip    Pain Orientation Right    Pain Onset 1 to 4 weeks ago    Pain Onset More than a month ago                [facility] Adult PT Treatment/Exercise - 08/19/21 0001       Ambulation/Gait   Ambulation/Gait Yes    Ambulation/Gait Assistance 5: Supervision    Ambulation Distance (Feet) 150 Feet   X2   Assistive device Straight cane    Gait Comments we adjusted cane height up 2 notches which was more  appropriate for him      Knee/Hip Exercises: Stretches   Hip Flexor Stretch Right;3 reps;60 seconds    Hip Flexor Stretch Limitations supine with Rt leg extended and Lt leg on bolster      Knee/Hip Exercises: Aerobic   Nustep Seat 12 level 7 with UE/LE X 10 min      Knee/Hip Exercises: Supine   Hip Adduction Isometric Both;15 reps    Hip Adduction Isometric Limitations 5 sec hold    Other Supine Knee/Hip Exercises attempted SLR but unable, performed 7 with min A then became painful so switched to bent knee raises X 10    Other Supine Knee/Hip Exercises clams with green X20      Manual Therapy   Manual therapy comments Rt hip PROM flexion, abd, IR,ER and cirlces to tolerance in supine                       PT Short Term Goals - 08/18/21 1615       PT SHORT TERM GOAL #1   Title He will be I and compliant with HEP    Time 4    Period Weeks    Status New    Target Date 09/15/21      PT SHORT TERM GOAL #2   Title He will be able to ambulate 200 feet with SPC mod I to supervision    Time 4    Period Weeks    Status New    Target Date 09/15/21      PT SHORT TERM GOAL #3   Title -      PT SHORT TERM GOAL #4   Title -               PT Long Term Goals - 08/18/21 1605       PT LONG TERM GOAL #1   Title He will improve FOTO functional score to 59%    Time 8    Period Weeks    Status New    Target Date 10/13/21      PT LONG TERM GOAL #2   Title Patient will improve Rt hip strength to 5/5 MMT all planes for functional activity    Baseline 4/5    Time 8    Period Weeks    Status New    Target Date 10/13/21      PT LONG TERM GOAL #3   Title Berg Balance >/= 43/56 to indicate lower fall risk    Baseline Berg 39/56    Time 8    Period Weeks    Status New      PT  LONG TERM GOAL #4   Title Patient ambulates 300' with SPC to no AD    Baseline can do this with rollator    Time 8    Period Weeks    Status New      PT LONG TERM GOAL #5   Title  Patient will improve Rt hip pain to overall less than 3/10 with ususal activity    Baseline 4 today, can still get up to 10    Time 8    Period Weeks    Status New    Target Date 10/13/21                   Plan - 08/19/21 1552     Clinical Impression Statement He was able to progress his overall walking tolerance today with SPC but we did adjust this to more appropriate height and he needs close supervision due to unsteady gait. We will continue to work to improve this with PT. Since he was sore from last time I did not have him perform the standing strength exrecises and instead we performed supine strengthening exercises which were easier for him.    Personal Factors and Comorbidities Comorbidity 3+;Fitness;Time since onset of injury/illness/exacerbation;Past/Current Experience    Comorbidities RT THA, Left TTA, left THA (05/23/20), arthritis, DM2, HTN, HLD, PVD    Examination-Activity Limitations Locomotion Level;Squat;Stairs;Stand;Transfers;Lift;Sleep    Examination-Participation Restrictions Community Activity;Occupation;Driving;Shop    Stability/Clinical Decision Making Evolving/Moderate complexity    Rehab Potential Good    PT Frequency 2x / week    PT Duration 8 weeks    PT Treatment/Interventions ADLs/Self Care Home Management;DME Instruction;Gait training;Stair training;Functional mobility training;Therapeutic activities;Therapeutic exercise;Balance training;Neuromuscular re-education;Patient/family education;Prosthetic Training;Vestibular;Dry needling;Manual techniques;Electrical Stimulation;Cryotherapy;Ultrasound;Passive range of motion;Joint Manipulations    PT Next Visit Plan needs Rt hip ROM and strengthening, gait and balance    PT Home Exercise Plan squats at counter top with UE support and chair behind him, standing hip flexion and abd with red band, standing hip flexor stretch    Consulted and Agree with Plan of Care Patient             Patient will benefit  from skilled therapeutic intervention in order to improve the following deficits and impairments:  Abnormal gait, Decreased activity tolerance, Decreased balance, Decreased endurance, Decreased knowledge of use of DME, Decreased mobility, Decreased range of motion, Decreased skin integrity, Decreased strength, Increased edema, Postural dysfunction, Prosthetic Dependency, Pain, Impaired flexibility  Visit Diagnosis: Pain in right hip  Stiffness of right hip, not elsewhere classified  Other abnormalities of gait and mobility  Unsteadiness on feet  Muscle weakness (generalized)     Problem List Patient Active Problem List   Diagnosis Date Noted   S/P BKA (below knee amputation) unilateral, left (HCC) 10/03/2020   Non-healing wound of lower extremity 10/03/2020   History of amputation of left foot through metatarsal bone (HCC) 09/11/2020   PAD (peripheral artery disease) (HCC) 08/20/2020   Status post total replacement of left hip 06/05/2020   Unilateral primary osteoarthritis, right hip 04/17/2020   Unilateral primary osteoarthritis, left hip 04/17/2020   Amputated toe, right (HCC) 11/22/2018   Type 2 diabetes mellitus with foot ulcer, without long-term current use of insulin (HCC) 11/22/2018   Essential hypertension 11/22/2018   Mixed hyperlipidemia 11/22/2018   Morbid obesity (HCC) 11/22/2018   Aneurysm of left popliteal artery (HCC) 11/22/2018   Status post peripheral artery angioplasty with insertion of stent 11/22/2018   Encounter for smoking cessation counseling 11/22/2018  Osteomyelitis of third toe of right foot (HCC)    Cellulitis of third toe of right foot     April Manson, PT,DPT 08/19/2021, 3:57 PM  [facility] Physical Therapy [address] [place], [place], [address] Phone: [phone]   Fax:  [phone]  Name: Anthony Park MRN: [id number] Date of Birth: February 22, 1961

## 2021-08-24 ENCOUNTER — Encounter: Payer: Self-pay | Admitting: Physical Therapy

## 2021-08-24 ENCOUNTER — Ambulatory Visit (INDEPENDENT_AMBULATORY_CARE_PROVIDER_SITE_OTHER): Payer: BC Managed Care – PPO | Admitting: Physical Therapy

## 2021-08-24 ENCOUNTER — Other Ambulatory Visit: Payer: Self-pay

## 2021-08-24 DIAGNOSIS — R2681 Unsteadiness on feet: Secondary | ICD-10-CM

## 2021-08-24 DIAGNOSIS — M25551 Pain in right hip: Secondary | ICD-10-CM

## 2021-08-24 DIAGNOSIS — R2689 Other abnormalities of gait and mobility: Secondary | ICD-10-CM | POA: Diagnosis not present

## 2021-08-24 DIAGNOSIS — M25651 Stiffness of right hip, not elsewhere classified: Secondary | ICD-10-CM | POA: Diagnosis not present

## 2021-08-24 DIAGNOSIS — M6281 Muscle weakness (generalized): Secondary | ICD-10-CM

## 2021-08-24 NOTE — Therapy (Signed)
[facility] Physical Therapy [address] [place], [place], [address] Phone: [phone]   Fax:  [phone]  Physical Therapy Treatment  Patient Details  Name: Anthony Park MRN: [id number] Date of Birth: September 21, 1960 Referring Provider (PT): Kriste Basque   Encounter Date: 08/24/2021   PT End of Session - 08/24/21 0850     Visit Number 3    Number of Visits 16    Date for PT Re-Evaluation 10/13/21    Authorization Type BCBS comm ppo    Authorization - Visit Number 27    Authorization - Number of Visits 116    PT Start Time [id number]    PT Stop Time 0928    PT Time Calculation (min) 46 min    Activity Tolerance Patient tolerated treatment well    Behavior During Therapy [facility] for tasks assessed/performed             Past Medical History:  Diagnosis Date   Arthritis    Diabetes mellitus without complication (HCC)    type 2 controlled with diet    Diabetic toe ulcer (HCC) 11/05/2018   History of kidney stones    passed 1 several years ago   Hyperlipidemia    Hypertension    not on medications   Peripheral vascular disease (HCC)    stent in left leg due to anerysm     Past Surgical History:  Procedure Laterality Date    Left Transmetatarsal Ampuation (Left Foot)  08/20/2020   ABDOMINAL AORTOGRAM W/LOWER EXTREMITY N/A 08/06/2020   Procedure: ABDOMINAL AORTOGRAM W/LOWER EXTREMITY;  Surgeon: [name], MD;  Location: [facility];  Service: Cardiovascular;  Laterality: N/A;   AMPUTATION Right 11/07/2018   Procedure: RIGHT 3RD TOE AMPUTATION;  Surgeon: Nadara Mustard, MD;  Location: [facility] OR;  Service: Orthopedics;  Laterality: Right;   AMPUTATION Left 08/20/2020   Procedure: Left Transmetatarsal Ampuation;  Surgeon: [name], MD;  Location: [facility] OR;  Service: Vascular;  Laterality: Left;   AMPUTATION Left 10/03/2020   Procedure: LEFT BELOW KNEE AMPUTATION;  Surgeon: [name], MD;  Location: [facility] OR;  Service: Vascular;   Laterality: Left;   AMPUTATION TOE Left    big toe and one next to it   CATARACT EXTRACTION Bilateral    EYE SURGERY Bilateral    cataracts removed   INSERTION OF ILIAC STENT Left    PERIPHERAL VASCULAR BALLOON ANGIOPLASTY Left 08/06/2020   Procedure: PERIPHERAL VASCULAR BALLOON ANGIOPLASTY;  Surgeon: [name], MD;  Location: [facility];  Service: Cardiovascular;  Laterality: Left;  Peroneal artery.   TOE AMPUTATION Left 2015   TOTAL HIP ARTHROPLASTY Left 05/23/2020   Procedure: LEFT TOTAL HIP ARTHROPLASTY ANTERIOR APPROACH;  Surgeon: Kathryne Hitch, MD;  Location: [facility] ORS;  Service: Orthopedics;  Laterality: Left;   TOTAL HIP ARTHROPLASTY Right 07/31/2021   Procedure: RIGHT TOTAL HIP ARTHROPLASTY ANTERIOR APPROACH;  Surgeon: Kathryne Hitch, MD;  Location: [facility] ORS;  Service: Orthopedics;  Laterality: Right;   WOUND DEBRIDEMENT Left 09/11/2020   Procedure: DEBRIDEMENT OF LEFT TRANSMETATARSAL WOUND;  Surgeon: Leonie Douglas, MD;  Location: [facility] OR;  Service: Vascular;  Laterality: Left;    There were no vitals filed for this visit.   Subjective Assessment - 08/24/21 0845     Subjective He went [place] with [place]. He did not ski or tube but significant increase in walking & steps. He is stiff, sore & moving slow this morning. Pain awoke him ~3am  which settled things down.    Pertinent History Left TTTA, left THA (05/23/20), arthritis, DM2, HTN, HLD, PVD    Patient Stated Goals Wants to use prosthesis to return to some work, get back in community, be mobile, work in gym in his garage    Currently in Pain? Yes    Pain Score 4    since last PT, lowest 0/10 - 10/10   Pain Location Hip    Pain Orientation Right    Pain Descriptors / Indicators Aching;Sore;Spasm;Sharp;Tightness    Pain Type Surgical pain    Pain Onset 1 to 4 weeks ago    Pain Frequency Intermittent    Aggravating Factors  increase in walking & stairs    Pain Relieving Factors rest &  gentle movement    Effect of Pain on Daily Activities walking    Multiple Pain Sites Yes   residual limb pain distally 5-6/10, ranging from 0/10-10/10   Pain Onset More than a month ago                               [facility] Adult PT Treatment/Exercise - 08/24/21 0842       Ambulation/Gait   Ambulation/Gait Yes    Ambulation/Gait Assistance 5: Supervision    Ambulation Distance (Feet) 150 Feet   X2   Assistive device Rollator      Knee/Hip Exercises: Stretches   Active Hamstring Stretch Right;3 reps;30 seconds   AAROM   Active Hamstring Stretch Limitations supine SLR w/ strap    Hip Flexor Stretch Right;3 reps;30 seconds   after contract relax   Hip Flexor Stretch Limitations RLE knee to chest to decrease lumbar lordosis,  left hip isometric hip ext 5 reps 5sec, then slide left hip out into hip extension for 30 sec hold,      Knee/Hip Exercises: Aerobic   Nustep Seat 12 level 7 with UE/LE X 10 min      Knee/Hip Exercises: Supine   Hip Adduction Isometric Both;15 reps    Hip Adduction Isometric Limitations 5 sec hold    Straight Leg Raises AROM;Strengthening;Left;2 sets;10 reps    Other Supine Knee/Hip Exercises clams with green BLEs 1st set, RLE only 2nd set stabilizing LLE, 3rd set LLE only stabilizing RLE, 20 reps ea set      Manual Therapy   Manual therapy comments Rt hip PROM flexion, abd, IR,ER and cirlces to tolerance in supine    Muscle Energy Technique contract relax for right hip flexor stretch      Prosthetics   Prosthetic Care Comments  Patient has redness & flat spot on distal residual limb from increased distal weight bearing.  He is still wearing size 3xL Vivewear shrinker against skin. his limb circumference in 15.25" which is size XL.  PT issued 2 new Vivewear size XL. PT instructed that too large could slide down & bunch up so he is weight bearing on wrinkles which would increase his pain.  PT recommending appt with prosthetist to add popliteal  pad. pt verbalized understanding.    Education Provided Other (comment)   see prosthetic care comments   Person(s) Educated Patient    Education Method Explanation;Demonstration;Verbal cues    Education Method Verbalized understanding;Verbal cues required                       PT Short Term Goals - 08/18/21 1615       PT  SHORT TERM GOAL #1   Title He will be I and compliant with HEP    Time 4    Period Weeks    Status New    Target Date 09/15/21      PT SHORT TERM GOAL #2   Title He will be able to ambulate 200 feet with SPC mod I to supervision    Time 4    Period Weeks    Status New    Target Date 09/15/21      PT SHORT TERM GOAL #3   Title -      PT SHORT TERM GOAL #4   Title -               PT Long Term Goals - 08/18/21 1605       PT LONG TERM GOAL #1   Title He will improve FOTO functional score to 59%    Time 8    Period Weeks    Status New    Target Date 10/13/21      PT LONG TERM GOAL #2   Title Patient will improve Rt hip strength to 5/5 MMT all planes for functional activity    Baseline 4/5    Time 8    Period Weeks    Status New    Target Date 10/13/21      PT LONG TERM GOAL #3   Title Berg Balance >/= 43/56 to indicate lower fall risk    Baseline Berg 39/56    Time 8    Period Weeks    Status New      PT LONG TERM GOAL #4   Title Patient ambulates 300' with SPC to no AD    Baseline can do this with rollator    Time 8    Period Weeks    Status New      PT LONG TERM GOAL #5   Title Patient will improve Rt hip pain to overall less than 3/10 with ususal activity    Baseline 4 today, can still get up to 10    Time 8    Period Weeks    Status New    Target Date 10/13/21                   Plan - 08/24/21 0850     Clinical Impression Statement PT had increased soreness from activity level over weekend so PT did not use cane or progress to standing exercises.  Manual techniques improved right hip range.  He was  able to perform SLR today with improved strength.    Personal Factors and Comorbidities Comorbidity 3+;Fitness;Time since onset of injury/illness/exacerbation;Past/Current Experience    Comorbidities RT THA, Left TTA, left THA (05/23/20), arthritis, DM2, HTN, HLD, PVD    Examination-Activity Limitations Locomotion Level;Squat;Stairs;Stand;Transfers;Lift;Sleep    Examination-Participation Restrictions Community Activity;Occupation;Driving;Shop    Stability/Clinical Decision Making Evolving/Moderate complexity    Rehab Potential Good    PT Frequency 2x / week    PT Duration 8 weeks    PT Treatment/Interventions ADLs/Self Care Home Management;DME Instruction;Gait training;Stair training;Functional mobility training;Therapeutic activities;Therapeutic exercise;Balance training;Neuromuscular re-education;Patient/family education;Prosthetic Training;Vestibular;Dry needling;Manual techniques;Electrical Stimulation;Cryotherapy;Ultrasound;Passive range of motion;Joint Manipulations    PT Next Visit Plan needs Rt hip ROM and strengthening progress to standing as tolerated, gait and balance    PT Home Exercise Plan squats at counter top with UE support and chair behind him, standing hip flexion and abd with red band, standing hip flexor stretch    Consulted  and Agree with Plan of Care Patient             Patient will benefit from skilled therapeutic intervention in order to improve the following deficits and impairments:  Abnormal gait, Decreased activity tolerance, Decreased balance, Decreased endurance, Decreased knowledge of use of DME, Decreased mobility, Decreased range of motion, Decreased skin integrity, Decreased strength, Increased edema, Postural dysfunction, Prosthetic Dependency, Pain, Impaired flexibility  Visit Diagnosis: Pain in right hip  Other abnormalities of gait and mobility  Unsteadiness on feet  Stiffness of right hip, not elsewhere classified  Muscle weakness  (generalized)     Problem List Patient Active Problem List   Diagnosis Date Noted   S/P BKA (below knee amputation) unilateral, left (HCC) 10/03/2020   Non-healing wound of lower extremity 10/03/2020   History of amputation of left foot through metatarsal bone (HCC) 09/11/2020   PAD (peripheral artery disease) (HCC) 08/20/2020   Status post total replacement of left hip 06/05/2020   Unilateral primary osteoarthritis, right hip 04/17/2020   Unilateral primary osteoarthritis, left hip 04/17/2020   Amputated toe, right (HCC) 11/22/2018   Type 2 diabetes mellitus with foot ulcer, without long-term current use of insulin (HCC) 11/22/2018   Essential hypertension 11/22/2018   Mixed hyperlipidemia 11/22/2018   Morbid obesity (HCC) 11/22/2018   Aneurysm of left popliteal artery (HCC) 11/22/2018   Status post peripheral artery angioplasty with insertion of stent 11/22/2018   Encounter for smoking cessation counseling 11/22/2018   Osteomyelitis of third toe of right foot (HCC)    Cellulitis of third toe of right foot     Vladimir Faster, PT, DPT 08/24/2021, 11:14 AM  [facility] Physical Therapy [address] [place], [place], [address] Phone: [phone]   Fax:  [phone]  Name: Anthony Park MRN: [id number] Date of Birth: 03/19/1961

## 2021-08-26 ENCOUNTER — Ambulatory Visit (INDEPENDENT_AMBULATORY_CARE_PROVIDER_SITE_OTHER): Payer: BC Managed Care – PPO | Admitting: Physical Therapy

## 2021-08-26 ENCOUNTER — Other Ambulatory Visit: Payer: Self-pay

## 2021-08-26 ENCOUNTER — Encounter: Payer: Self-pay | Admitting: Physical Therapy

## 2021-08-26 DIAGNOSIS — M25651 Stiffness of right hip, not elsewhere classified: Secondary | ICD-10-CM

## 2021-08-26 DIAGNOSIS — M25551 Pain in right hip: Secondary | ICD-10-CM | POA: Diagnosis not present

## 2021-08-26 DIAGNOSIS — R2689 Other abnormalities of gait and mobility: Secondary | ICD-10-CM

## 2021-08-26 DIAGNOSIS — R2681 Unsteadiness on feet: Secondary | ICD-10-CM

## 2021-08-26 DIAGNOSIS — M6281 Muscle weakness (generalized): Secondary | ICD-10-CM

## 2021-08-26 NOTE — Therapy (Signed)
[facility] Physical Therapy [address] [place], [place], [address] Phone: [phone]   Fax:  [phone]  Physical Therapy Treatment  Patient Details  Name: Anthony Park MRN: [id number] Date of Birth: Aug 22, 1961 Referring Provider (PT): Kriste Basque   Encounter Date: 08/26/2021   PT End of Session - 08/26/21 0855     Visit Number 4    Number of Visits 16    Date for PT Re-Evaluation 10/13/21    Authorization Type BCBS comm ppo    Authorization - Visit Number 28    Authorization - Number of Visits [id number]    PT Start Time 0845    PT Stop Time 0938    PT Time Calculation (min) 53 min    Activity Tolerance Patient tolerated treatment well    Behavior During Therapy [facility] for tasks assessed/performed             Past Medical History:  Diagnosis Date   Arthritis    Diabetes mellitus without complication (HCC)    type 2 controlled with diet    Diabetic toe ulcer (HCC) 11/05/2018   History of kidney stones    passed 1 several years ago   Hyperlipidemia    Hypertension    not on medications   Peripheral vascular disease (HCC)    stent in left leg due to anerysm     Past Surgical History:  Procedure Laterality Date    Left Transmetatarsal Ampuation (Left Foot)  08/20/2020   ABDOMINAL AORTOGRAM W/LOWER EXTREMITY N/A 08/06/2020   Procedure: ABDOMINAL AORTOGRAM W/LOWER EXTREMITY;  Surgeon: [name], MD;  [facility];  Service: Cardiovascular;  Laterality: N/A;   AMPUTATION Right 11/07/2018   Procedure: RIGHT 3RD TOE AMPUTATION;  Surgeon: Nadara Mustard, MD;  Location: [facility] OR;  Service: Orthopedics;  Laterality: Right;   AMPUTATION Left 08/20/2020   Procedure: Left Transmetatarsal Ampuation;  Surgeon: [name], MD;  Location: [facility] OR;  Service: Vascular;  Laterality: Left;   AMPUTATION Left 10/03/2020   Procedure: LEFT BELOW KNEE AMPUTATION;  Surgeon: [name], MD;  Location: [facility] OR;  Service: Vascular;   Laterality: Left;   AMPUTATION TOE Left    big toe and one next to it   CATARACT EXTRACTION Bilateral    EYE SURGERY Bilateral    cataracts removed   INSERTION OF ILIAC STENT Left    PERIPHERAL VASCULAR BALLOON ANGIOPLASTY Left 08/06/2020   Procedure: PERIPHERAL VASCULAR BALLOON ANGIOPLASTY;  Surgeon: [name], MD;  [facility]: [facility];  Service: Cardiovascular;  Laterality: Left;  Peroneal artery.   TOE AMPUTATION Left 2015   TOTAL HIP ARTHROPLASTY Left 05/23/2020   Procedure: LEFT TOTAL HIP ARTHROPLASTY ANTERIOR APPROACH;  Surgeon: Kathryne Hitch, MD;  Location: [facility] ORS;  Service: Orthopedics;  Laterality: Left;   TOTAL HIP ARTHROPLASTY Right 07/31/2021   Procedure: RIGHT TOTAL HIP ARTHROPLASTY ANTERIOR APPROACH;  Surgeon: Kathryne Hitch, MD;  Location: [facility] ORS;  Service: Orthopedics;  Laterality: Right;   WOUND DEBRIDEMENT Left 09/11/2020   Procedure: DEBRIDEMENT OF LEFT TRANSMETATARSAL WOUND;  Surgeon: Leonie Douglas, MD;  Location: [facility] OR;  Service: Vascular;  Laterality: Left;    There were no vitals filed for this visit.   Subjective Assessment - 08/26/21 0845     Subjective Prosthetist added popliteal pad to socket and checked alignment. He is wearing XL Vivewear shrinker under liner.  His shin still is sore otherwise he has recovered from increased activity over the  weekend.    Pertinent History Left TTTA, left THA (05/23/20), arthritis, DM2, HTN, HLD, PVD    Patient Stated Goals Wants to use prosthesis to return to some work, get back in community, be mobile, work in gym in his garage    Currently in Pain? Yes    Pain Score 0-No pain   since last PT 2 days ago, up to 8/10 when he is laying down.   Pain Location Hip    Pain Orientation Right    Pain Descriptors / Indicators Aching;Sore;Tightness    Pain Type Surgical pain    Pain Onset More than a month ago    Pain Score 4    Pain Location Leg   residual limb   Pain Orientation  Left;Anterior    Pain Descriptors / Indicators Sore;Sharp    Pain Type Chronic pain    Pain Onset More than a month ago    Pain Frequency Constant    Aggravating Factors  prosthetic pressure    Pain Relieving Factors tylenol & Rx meds    Effect of Pain on Daily Activities sleep & standing/walking time                               [facility] Adult PT Treatment/Exercise - 08/26/21 0845       Ambulation/Gait   Ambulation/Gait Yes    Ambulation/Gait Assistance 4: Min assist    Ambulation Distance (Feet) 50 Feet    Assistive device Straight cane;Prosthesis      Knee/Hip Exercises: Stretches   Active Hamstring Stretch Right;3 reps;30 seconds   AAROM   Active Hamstring Stretch Limitations seated with strap DF    Quad Stretch Right;2 reps;30 seconds    Quad Stretch Limitations LLE knee to chest to decrease lumbar lordosis & ball under thigh to decrease hip flexor spasm, RLE over edge with PT manual stretch    Hip Flexor Stretch Right;3 reps;30 seconds   after contract relax   Hip Flexor Stretch Limitations RLE knee to chest to decrease lumbar lordosis,  left hip isometric hip ext 5 reps 5sec, then slide left hip out into hip extension for 30 sec hold,    Other Knee/Hip Stretches heel slide using strap to work on ext for 10 sec & flex with knee to chest 10 sec hold  5 reps.  Then PT assisted stretch with contralateral LE in opposite position 3 reps.      Knee/Hip Exercises: Aerobic   Nustep Seat 12 level 7 with UE/LE X 10 min      Knee/Hip Exercises: Supine   Heel Slides AAROM;Left;1 set;5 reps   strap assist   Hip Adduction Isometric --    Hip Adduction Isometric Limitations --    Henreitta Leber Strengthening;Both;3 sets;10 reps    Bridges Limitations 1st set straight bridge with limited range,  2nd set off 4" Yoga block under pelvis for increased range, 3rd set off 4" yoga block + adductor squeeze with ball.  after 3rd set, 1 rep without block with significant increased range  from 1st set.    Straight Leg Raises AROM;Strengthening;Left;2 sets;10 reps    Other Supine Knee/Hip Exercises abduction with RLE as straight as possible with strap assist, 10 sec hold for adductor stretch, 10 reps    Other Supine Knee/Hip Exercises clams with blue BLEs 1st set, RLE only 2nd set stabilizing LLE, 3rd set LLE only stabilizing RLE, 20 reps ea set  Manual Therapy   Manual therapy comments Rt hip PROM flexion, abd, IR,ER and cirlces to tolerance in supine    Muscle Energy Technique contract relax for right hip flexor stretch      Prosthetics   Prosthetic Care Comments  --    Education Provided --                       PT Short Term Goals - 08/18/21 1615       PT SHORT TERM GOAL #1   Title He will be I and compliant with HEP    Time 4    Period Weeks    Status New    Target Date 09/15/21      PT SHORT TERM GOAL #2   Title He will be able to ambulate 200 feet with SPC mod I to supervision    Time 4    Period Weeks    Status New    Target Date 09/15/21      PT SHORT TERM GOAL #3   Title -      PT SHORT TERM GOAL #4   Title -               PT Long Term Goals - 08/18/21 1605       PT LONG TERM GOAL #1   Title He will improve FOTO functional score to 59%    Time 8    Period Weeks    Status New    Target Date 10/13/21      PT LONG TERM GOAL #2   Title Patient will improve Rt hip strength to 5/5 MMT all planes for functional activity    Baseline 4/5    Time 8    Period Weeks    Status New    Target Date 10/13/21      PT LONG TERM GOAL #3   Title Berg Balance >/= 43/56 to indicate lower fall risk    Baseline Berg 39/56    Time 8    Period Weeks    Status New      PT LONG TERM GOAL #4   Title Patient ambulates 300' with SPC to no AD    Baseline can do this with rollator    Time 8    Period Weeks    Status New      PT LONG TERM GOAL #5   Title Patient will improve Rt hip pain to overall less than 3/10 with ususal activity     Baseline 4 today, can still get up to 10    Time 8    Period Weeks    Status New    Target Date 10/13/21                   Plan - 08/26/21 0855     Clinical Impression Statement PT worked on range of hip & stretching muscles with manual therapy & exercises. He improved with skilled instruction & progression. He was able to ambulate in clinic with cane but prosthetic limb pain seems to be primary limiting factor.    Personal Factors and Comorbidities Comorbidity 3+;Fitness;Time since onset of injury/illness/exacerbation;Past/Current Experience    Comorbidities RT THA, Left TTA, left THA (05/23/20), arthritis, DM2, HTN, HLD, PVD    Examination-Activity Limitations Locomotion Level;Squat;Stairs;Stand;Transfers;Lift;Sleep    Examination-Participation Restrictions Community Activity;Occupation;Driving;Shop    Stability/Clinical Decision Making Evolving/Moderate complexity    Rehab Potential Good    PT Frequency 2x / week  PT Duration 8 weeks    PT Treatment/Interventions ADLs/Self Care Home Management;DME Instruction;Gait training;Stair training;Functional mobility training;Therapeutic activities;Therapeutic exercise;Balance training;Neuromuscular re-education;Patient/family education;Prosthetic Training;Vestibular;Dry needling;Manual techniques;Electrical Stimulation;Cryotherapy;Ultrasound;Passive range of motion;Joint Manipulations    PT Next Visit Plan continue to work on Rt hip ROM and strengthening progress to standing as tolerated, gait and balance    PT Home Exercise Plan squats at counter top with UE support and chair behind him, standing hip flexion and abd with red band, standing hip flexor stretch    Consulted and Agree with Plan of Care Patient             Patient will benefit from skilled therapeutic intervention in order to improve the following deficits and impairments:  Abnormal gait, Decreased activity tolerance, Decreased balance, Decreased endurance,  Decreased knowledge of use of DME, Decreased mobility, Decreased range of motion, Decreased skin integrity, Decreased strength, Increased edema, Postural dysfunction, Prosthetic Dependency, Pain, Impaired flexibility  Visit Diagnosis: Pain in right hip  Other abnormalities of gait and mobility  Unsteadiness on feet  Stiffness of right hip, not elsewhere classified  Muscle weakness (generalized)     Problem List Patient Active Problem List   Diagnosis Date Noted   S/P BKA (below knee amputation) unilateral, left (HCC) 10/03/2020   Non-healing wound of lower extremity 10/03/2020   History of amputation of left foot through metatarsal bone (HCC) 09/11/2020   PAD (peripheral artery disease) (HCC) 08/20/2020   Status post total replacement of left hip 06/05/2020   Unilateral primary osteoarthritis, right hip 04/17/2020   Unilateral primary osteoarthritis, left hip 04/17/2020   Amputated toe, right (HCC) 11/22/2018   Type 2 diabetes mellitus with foot ulcer, without long-term current use of insulin (HCC) 11/22/2018   Essential hypertension 11/22/2018   Mixed hyperlipidemia 11/22/2018   Morbid obesity (HCC) 11/22/2018   Aneurysm of left popliteal artery (HCC) 11/22/2018   Status post peripheral artery angioplasty with insertion of stent 11/22/2018   Encounter for smoking cessation counseling 11/22/2018   Osteomyelitis of third toe of right foot (HCC)    Cellulitis of third toe of right foot     Vladimir Faster, PT, DPT 08/26/2021, 10:21 AM  [facility] Physical Therapy [address] [place], [place], [address] Phone: [phone]   Fax:  [phone]  Name: ROLDAN LAFOREST MRN: [id number] Date of Birth: 1961-01-23

## 2021-08-31 ENCOUNTER — Encounter: Payer: Self-pay | Admitting: Physical Therapy

## 2021-08-31 ENCOUNTER — Ambulatory Visit (INDEPENDENT_AMBULATORY_CARE_PROVIDER_SITE_OTHER): Payer: BC Managed Care – PPO | Admitting: Physical Therapy

## 2021-08-31 ENCOUNTER — Other Ambulatory Visit: Payer: Self-pay

## 2021-08-31 DIAGNOSIS — M25651 Stiffness of right hip, not elsewhere classified: Secondary | ICD-10-CM

## 2021-08-31 DIAGNOSIS — R2681 Unsteadiness on feet: Secondary | ICD-10-CM | POA: Diagnosis not present

## 2021-08-31 DIAGNOSIS — R2689 Other abnormalities of gait and mobility: Secondary | ICD-10-CM

## 2021-08-31 DIAGNOSIS — M25662 Stiffness of left knee, not elsewhere classified: Secondary | ICD-10-CM

## 2021-08-31 DIAGNOSIS — M25551 Pain in right hip: Secondary | ICD-10-CM

## 2021-08-31 DIAGNOSIS — M6281 Muscle weakness (generalized): Secondary | ICD-10-CM

## 2021-08-31 NOTE — Therapy (Signed)
[facility] Physical Therapy [address] [place], [place], [address] Phone: [phone]   Fax:  [phone]  Physical Therapy Treatment  Patient Details  Name: Anthony Park MRN: [id number] Date of Birth: [birth year] Referring Provider (PT): Kriste Basque   Encounter Date: 08/31/2021   PT End of Session - 08/31/21 0849     Visit Number 5    Number of Visits 16    Date for PT Re-Evaluation 10/13/21    Authorization Type BCBS comm ppo    Authorization - Visit Number 29    Authorization - Number of Visits 116    PT Start Time 0845    PT Stop Time 0931    PT Time Calculation (min) 46 min    Activity Tolerance Patient tolerated treatment well    Behavior During Therapy [facility] for tasks assessed/performed             Past Medical History:  Diagnosis Date   Arthritis    Diabetes mellitus without complication (HCC)    type 2 controlled with diet    Diabetic toe ulcer (HCC) 11/05/2018   History of kidney stones    passed 1 several years ago   Hyperlipidemia    Hypertension    not on medications   Peripheral vascular disease (HCC)    stent in left leg due to anerysm     Past Surgical History:  Procedure Laterality Date    Left Transmetatarsal Ampuation (Left Foot)  08/20/2020   ABDOMINAL AORTOGRAM W/LOWER EXTREMITY N/A 08/06/2020   Procedure: ABDOMINAL AORTOGRAM W/LOWER EXTREMITY;  Surgeon: [name], MD;  [facility]: [facility];  Service: Cardiovascular;  Laterality: N/A;   AMPUTATION Right 11/07/2018   Procedure: RIGHT 3RD TOE AMPUTATION;  Surgeon: Nadara Mustard, MD;  Location: [facility] OR;  Service: Orthopedics;  Laterality: Right;   AMPUTATION Left 08/20/2020   Procedure: Left Transmetatarsal Ampuation;  Surgeon: [name], MD;  Location: [facility] OR;  Service: Vascular;  Laterality: Left;   AMPUTATION Left 10/03/2020   Procedure: LEFT BELOW KNEE AMPUTATION;  Surgeon: [name], MD;  Location: [facility] OR;  Service: Vascular;   Laterality: Left;   AMPUTATION TOE Left    big toe and one next to it   CATARACT EXTRACTION Bilateral    EYE SURGERY Bilateral    cataracts removed   INSERTION OF ILIAC STENT Left    PERIPHERAL VASCULAR BALLOON ANGIOPLASTY Left 08/06/2020   Procedure: PERIPHERAL VASCULAR BALLOON ANGIOPLASTY;  Surgeon: [name], MD;  Location: [facility];  Service: Cardiovascular;  Laterality: Left;  Peroneal artery.   TOE AMPUTATION Left 2015   TOTAL HIP ARTHROPLASTY Left 05/23/2020   Procedure: LEFT TOTAL HIP ARTHROPLASTY ANTERIOR APPROACH;  Surgeon: Kathryne Hitch, MD;  Location: [facility] ORS;  Service: Orthopedics;  Laterality: Left;   TOTAL HIP ARTHROPLASTY Right 07/31/2021   Procedure: RIGHT TOTAL HIP ARTHROPLASTY ANTERIOR APPROACH;  Surgeon: Kathryne Hitch, MD;  Location: [facility] ORS;  Service: Orthopedics;  Laterality: Right;   WOUND DEBRIDEMENT Left 09/11/2020   Procedure: DEBRIDEMENT OF LEFT TRANSMETATARSAL WOUND;  Surgeon: Leonie Douglas, MD;  Location: [facility] OR;  Service: Vascular;  Laterality: Left;    There were no vitals filed for this visit.   Subjective Assessment - 08/31/21 0845     Subjective He went to trampoline park with grandchildren and bounced a little.  His hip did not hurt but his left shin bone burned. He has been doing his HEP including stretches.  Pertinent History Left TTTA, left THA (05/23/20), arthritis, DM2, HTN, HLD, PVD    Patient Stated Goals Wants to use prosthesis to return to some work, get back in community, be mobile, work in gym in his garage    Currently in Pain? No/denies    Pain Score 0-No pain    Pain Location Hip    Pain Onset More than a month ago    Pain Onset More than a month ago                               [facility] Adult PT Treatment/Exercise - 08/31/21 0844       Ambulation/Gait   Ambulation/Gait Yes    Ambulation/Gait Assistance 5: Supervision    Ambulation Distance (Feet) 75 Feet   50' & 75'    Assistive device Straight cane;Prosthesis    Ramp 5: Supervision;4: Min assist   descend minA more from TTA tibial pain not balance, ascend supervision.  with cane & TTA prosthesis   Curb 5: Supervision   cane & TTA prosthesis     Neuro Re-ed    Neuro Re-ed Details  balance in bars intermittent touch on foam beam tandem stance 30 sec RLE in front & RLE in back.  crossways stance narrow stance (~3" bw feet) head movements 4 directions 5 reps EO & static EC 10 sec 3 reps.      Knee/Hip Exercises: Stretches   Active Naval architect --    Active Hamstring Stretch Limitations --    Acupuncturist Limitations --    Hip Flexor Stretch Right;3 reps;30 seconds    Hip Flexor Stretch Limitations PT instructed with demo & verbal cues 3 alternative ways to stretch in standing: 1)straddle step stretch.  2)standing upright with posterior pelvis to counter top & feet under pelvis working for upright posture. This stretch goal is duration 1-2x/day.  3)standing with posterior pelvis to door frame reaching overhead single UE 2 reps ea & BUEs 2 reps with hold for 2 deep breathes. PT recommended when exiting bathroom for increased frequency. Pt verbalized understanding.    Other Knee/Hip Stretches --      Knee/Hip Exercises: Aerobic   Nustep Seat 12 level 7 with UE/LE X 10 min      Knee/Hip Exercises: Machines for Strengthening   Total Gym Leg Press Shuttle leg press 125# [id number] 1st set back 45* position 3 & 2nd set back flat to change hip position 2      Knee/Hip Exercises: Standing   Rocker Board 1 minute   ant/level/post & right/level/left, BUE support   Rocker Board Limitations round w/1 pivot point, mirror & verbal cues for balance reactions      Knee/Hip Exercises: Supine   Heel Slides --    Henreitta Leber --    Bridges Limitations --    Straight Leg Raises --    Other Supine Knee/Hip Exercises --    Other Supine Knee/Hip Exercises --      Manual Therapy   Manual therapy comments --     Muscle Energy Technique --      Prosthetics   Prosthetic Care Comments  PT explained pain may be either or both pressure or friction related. The pads should help with pressure related pain. PT recommended to try baby oil on shin bone only prior to donning liner.    Current prosthetic wear tolerance (days/week)  daily  Current prosthetic wear tolerance (#hours/day)  most of awake hours    Residual limb condition  residual l imb pain along tibial crest up to 6/10    Education Provided Other (comment)   see prosthetic care commments   Person(s) Educated Patient    Education Method Explanation;Verbal cues    Education Method Verbalized understanding;Needs further instruction;Verbal cues required    Donning Prosthesis Modified independent (device/increased time)                       PT Short Term Goals - 08/18/21 1615       PT SHORT TERM GOAL #1   Title He will be I and compliant with HEP    Time 4    Period Weeks    Status New    Target Date 09/15/21      PT SHORT TERM GOAL #2   Title He will be able to ambulate 200 feet with SPC mod I to supervision    Time 4    Period Weeks    Status New    Target Date 09/15/21      PT SHORT TERM GOAL #3   Title -      PT SHORT TERM GOAL #4   Title -               PT Long Term Goals - 08/18/21 1605       PT LONG TERM GOAL #1   Title He will improve FOTO functional score to 59%    Time 8    Period Weeks    Status New    Target Date 10/13/21      PT LONG TERM GOAL #2   Title Patient will improve Rt hip strength to 5/5 MMT all planes for functional activity    Baseline 4/5    Time 8    Period Weeks    Status New    Target Date 10/13/21      PT LONG TERM GOAL #3   Title Berg Balance >/= 43/56 to indicate lower fall risk    Baseline Berg 39/56    Time 8    Period Weeks    Status New      PT LONG TERM GOAL #4   Title Patient ambulates 300' with SPC to no AD    Baseline can do this with rollator     Time 8    Period Weeks    Status New      PT LONG TERM GOAL #5   Title Patient will improve Rt hip pain to overall less than 3/10 with ususal activity    Baseline 4 today, can still get up to 10    Time 8    Period Weeks    Status New    Target Date 10/13/21                   Plan - 08/31/21 0850     Clinical Impression Statement Pt appears to understand different hip flexor stretches and reports could each in different locations of Iliopsoas muscle.  His balance for gait with cane has improved but residual limb pain with weight bearing on socket limits use.  Pt continues to benefit from skilled PT services to improve function & safety.    Personal Factors and Comorbidities Comorbidity 3+;Fitness;Time since onset of injury/illness/exacerbation;Past/Current Experience    Comorbidities RT THA, Left TTA, left THA (05/23/20), arthritis, DM2, HTN, HLD, PVD  Examination-Activity Limitations Locomotion Level;Squat;Stairs;Stand;Transfers;Lift;Sleep    Examination-Participation Restrictions Community Activity;Occupation;Driving;Shop    Stability/Clinical Decision Making Evolving/Moderate complexity    Rehab Potential Good    PT Frequency 2x / week    PT Duration 8 weeks    PT Treatment/Interventions ADLs/Self Care Home Management;DME Instruction;Gait training;Stair training;Functional mobility training;Therapeutic activities;Therapeutic exercise;Balance training;Neuromuscular re-education;Patient/family education;Prosthetic Training;Vestibular;Dry needling;Manual techniques;Electrical Stimulation;Cryotherapy;Ultrasound;Passive range of motion;Joint Manipulations    PT Next Visit Plan work on Rt hip ROM and strengthening progress to standing as tolerated, gait and balance    PT Home Exercise Plan squats at counter top with UE support and chair behind him, standing hip flexion and abd with red band, standing hip flexor stretch    Consulted and Agree with Plan of Care Patient              Patient will benefit from skilled therapeutic intervention in order to improve the following deficits and impairments:  Abnormal gait, Decreased activity tolerance, Decreased balance, Decreased endurance, Decreased knowledge of use of DME, Decreased mobility, Decreased range of motion, Decreased skin integrity, Decreased strength, Increased edema, Postural dysfunction, Prosthetic Dependency, Pain, Impaired flexibility  Visit Diagnosis: Pain in right hip  Other abnormalities of gait and mobility  Unsteadiness on feet  Stiffness of right hip, not elsewhere classified  Muscle weakness (generalized)  Stiffness of left knee, not elsewhere classified     Problem List Patient Active Problem List   Diagnosis Date Noted   S/P BKA (below knee amputation) unilateral, left (HCC) 10/03/2020   Non-healing wound of lower extremity 10/03/2020   History of amputation of left foot through metatarsal bone (HCC) 09/11/2020   PAD (peripheral artery disease) (HCC) 08/20/2020   Status post total replacement of left hip 06/05/2020   Unilateral primary osteoarthritis, right hip 04/17/2020   Unilateral primary osteoarthritis, left hip 04/17/2020   Amputated toe, right (HCC) 11/22/2018   Type 2 diabetes mellitus with foot ulcer, without long-term current use of insulin (HCC) 11/22/2018   Essential hypertension 11/22/2018   Mixed hyperlipidemia 11/22/2018   Morbid obesity (HCC) 11/22/2018   Aneurysm of left popliteal artery (HCC) 11/22/2018   Status post peripheral artery angioplasty with insertion of stent 11/22/2018   Encounter for smoking cessation counseling 11/22/2018   Osteomyelitis of third toe of right foot (HCC)    Cellulitis of third toe of right foot     Vladimir Faster, PT, DPT 08/31/2021, 9:53 AM  [facility] Physical Therapy [address] [place], [place], [address] Phone: [phone]   Fax:  [phone]  Name: Anthony Park MRN: [id number] Date of Birth:  09-30-1960

## 2021-09-02 ENCOUNTER — Encounter: Payer: Self-pay | Admitting: Physical Therapy

## 2021-09-02 ENCOUNTER — Other Ambulatory Visit: Payer: Self-pay | Admitting: Physician Assistant

## 2021-09-02 ENCOUNTER — Other Ambulatory Visit: Payer: Self-pay

## 2021-09-02 ENCOUNTER — Ambulatory Visit (INDEPENDENT_AMBULATORY_CARE_PROVIDER_SITE_OTHER): Payer: BC Managed Care – PPO | Admitting: Physical Therapy

## 2021-09-02 ENCOUNTER — Telehealth: Payer: Self-pay | Admitting: Orthopaedic Surgery

## 2021-09-02 DIAGNOSIS — M25551 Pain in right hip: Secondary | ICD-10-CM | POA: Diagnosis not present

## 2021-09-02 DIAGNOSIS — R2681 Unsteadiness on feet: Secondary | ICD-10-CM

## 2021-09-02 DIAGNOSIS — M25651 Stiffness of right hip, not elsewhere classified: Secondary | ICD-10-CM

## 2021-09-02 DIAGNOSIS — R2689 Other abnormalities of gait and mobility: Secondary | ICD-10-CM | POA: Diagnosis not present

## 2021-09-02 DIAGNOSIS — M6281 Muscle weakness (generalized): Secondary | ICD-10-CM

## 2021-09-02 MED ORDER — OXYCODONE HCL 5 MG PO TABS
5.0000 mg | ORAL_TABLET | ORAL | 0 refills | Status: DC | PRN
Start: 2021-09-02 — End: 2021-12-09

## 2021-09-02 NOTE — Telephone Encounter (Signed)
Patient was here for PT. He would like oxycodone called in. His call back number is [phone]

## 2021-09-02 NOTE — Therapy (Signed)
[facility] Physical Therapy [address] [place], [place], [address] Phone: [phone]   Fax:  [phone]  Physical Therapy Treatment  Patient Details  Name: Anthony Park MRN: [id number] Date of Birth: Jan 07, 1961 Referring Provider (PT): Kriste Basque   Encounter Date: 09/02/2021   PT End of Session - 09/02/21 0855     Visit Number 6    Number of Visits 16    Date for PT Re-Evaluation 10/13/21    Authorization Type BCBS comm ppo    Authorization - Visit Number 30    Authorization - Number of Visits 116    PT Start Time 0845    PT Stop Time 0930    PT Time Calculation (min) 45 min    Activity Tolerance Patient tolerated treatment well    Behavior During Therapy [facility] for tasks assessed/performed             Past Medical History:  Diagnosis Date   Arthritis    Diabetes mellitus without complication (HCC)    type 2 controlled with diet    Diabetic toe ulcer (HCC) 11/05/2018   History of kidney stones    passed 1 several years ago   Hyperlipidemia    Hypertension    not on medications   Peripheral vascular disease (HCC)    stent in left leg due to anerysm     Past Surgical History:  Procedure Laterality Date    Left Transmetatarsal Ampuation (Left Foot)  08/20/2020   ABDOMINAL AORTOGRAM W/LOWER EXTREMITY N/A 08/06/2020   Procedure: ABDOMINAL AORTOGRAM W/LOWER EXTREMITY;  Surgeon: [name], MD;  [facility]: [facility];  Service: Cardiovascular;  Laterality: N/A;   AMPUTATION Right 11/07/2018   Procedure: RIGHT 3RD TOE AMPUTATION;  Surgeon: Nadara Mustard, MD;  Location: [facility] OR;  Service: Orthopedics;  Laterality: Right;   AMPUTATION Left 08/20/2020   Procedure: Left Transmetatarsal Ampuation;  Surgeon: [name], MD;  Location: [facility] OR;  Service: Vascular;  Laterality: Left;   AMPUTATION Left 10/03/2020   Procedure: LEFT BELOW KNEE AMPUTATION;  Surgeon: [name], MD;  Location: [facility] OR;  Service: Vascular;   Laterality: Left;   AMPUTATION TOE Left    big toe and one next to it   CATARACT EXTRACTION Bilateral    EYE SURGERY Bilateral    cataracts removed   INSERTION OF ILIAC STENT Left    PERIPHERAL VASCULAR BALLOON ANGIOPLASTY Left 08/06/2020   Procedure: PERIPHERAL VASCULAR BALLOON ANGIOPLASTY;  Surgeon: [name], MD;  Location: [facility];  Service: Cardiovascular;  Laterality: Left;  Peroneal artery.   TOE AMPUTATION Left 2015   TOTAL HIP ARTHROPLASTY Left 05/23/2020   Procedure: LEFT TOTAL HIP ARTHROPLASTY ANTERIOR APPROACH;  Surgeon: Kathryne Hitch, MD;  Location: [facility] ORS;  Service: Orthopedics;  Laterality: Left;   TOTAL HIP ARTHROPLASTY Right 07/31/2021   Procedure: RIGHT TOTAL HIP ARTHROPLASTY ANTERIOR APPROACH;  Surgeon: Kathryne Hitch, MD;  Location: [facility] ORS;  Service: Orthopedics;  Laterality: Right;   WOUND DEBRIDEMENT Left 09/11/2020   Procedure: DEBRIDEMENT OF LEFT TRANSMETATARSAL WOUND;  Surgeon: Leonie Douglas, MD;  Location: [facility] OR;  Service: Vascular;  Laterality: Left;    There were no vitals filed for this visit.   Subjective Assessment - 09/02/21 0845     Subjective His hip was uncomfortable laying on side when sleeping and had to go to kitchen to take meds to go back to sleep.  He is using pillow or folded blanket for  positioning LEs.    Pertinent History Left TTTA, left THA (05/23/20), arthritis, DM2, HTN, HLD, PVD    Patient Stated Goals Wants to use prosthesis to return to some work, get back in community, be mobile, work in gym in his garage    Currently in Pain? No/denies   in last week, right hip up to 8-9/10 either sitting still too long or walking too much.  changing positions helps 1/3 of time otherwise has to take meds   Pain Location Hip    Pain Onset More than a month ago    Pain Onset More than a month ago                               [facility] Adult PT Treatment/Exercise - 09/02/21 0845        Ambulation/Gait   Ambulation/Gait Yes    Ambulation/Gait Assistance 5: Supervision    Ambulation Distance (Feet) 75 Feet   50' & 75'   Assistive device Straight cane;Prosthesis    Ramp 5: Supervision;4: Min assist   descend minA more from TTA tibial pain not balance, ascend supervision.  with cane & TTA prosthesis   Curb 5: Supervision   cane & TTA prosthesis     Neuro Re-ed    Neuro Re-ed Details  stepping to 3 cones (ant-lat, lat, post-lat) leading with RLE 5reps & LLE 5 reps using cane RUE and intermittent LUE //bar      Knee/Hip Exercises: Stretches   Active Hamstring Stretch Both;2 reps;30 seconds    Active Hamstring Stretch Limitations seated SLR with strap DF    Hip Flexor Stretch Right;3 reps;30 seconds    Hip Flexor Stretch Limitations supine LLE knee to chest & RLE over edge      Knee/Hip Exercises: Aerobic   Nustep Seat 12 level 7 with UE/LE X 8 min      Knee/Hip Exercises: Machines for Strengthening   Total Gym Leg Press Shuttle leg press 125# DL 0Y30 1st set back 45* position 3 & 2nd set back flat to change hip position 2,  RLE only 68# 15 reps 1 set      Knee/Hip Exercises: Standing   Rocker Board --    Rocker Board Limitations --      Prosthetics   Current prosthetic wear tolerance (days/week)  daily    Current prosthetic wear tolerance (#hours/day)  most of awake hours    Residual limb condition  residual l imb pain along tibial crest up to 6/10    Education Provided Other (comment)   see prosthetic care commments                      PT Short Term Goals - 08/18/21 1615       PT SHORT TERM GOAL #1   Title He will be I and compliant with HEP    Time 4    Period Weeks    Status New    Target Date 09/15/21      PT SHORT TERM GOAL #2   Title He will be able to ambulate 200 feet with SPC mod I to supervision    Time 4    Period Weeks    Status New    Target Date 09/15/21      PT SHORT TERM GOAL #3   Title -      PT SHORT TERM GOAL #4    Title -  PT Long Term Goals - 08/18/21 1605       PT LONG TERM GOAL #1   Title He will improve FOTO functional score to 59%    Time 8    Period Weeks    Status New    Target Date 10/13/21      PT LONG TERM GOAL #2   Title Patient will improve Rt hip strength to 5/5 MMT all planes for functional activity    Baseline 4/5    Time 8    Period Weeks    Status New    Target Date 10/13/21      PT LONG TERM GOAL #3   Title Berg Balance >/= 43/56 to indicate lower fall risk    Baseline Berg 39/56    Time 8    Period Weeks    Status New      PT LONG TERM GOAL #4   Title Patient ambulates 300' with SPC to no AD    Baseline can do this with rollator    Time 8    Period Weeks    Status New      PT LONG TERM GOAL #5   Title Patient will improve Rt hip pain to overall less than 3/10 with ususal activity    Baseline 4 today, can still get up to 10    Time 8    Period Weeks    Status New    Target Date 10/13/21                   Plan - 09/02/21 0855     Clinical Impression Statement Pt was limited by residual limb pain today. He is progressing with hip strength & function with skilled instruction.  He has muscle tightness effecting his upright posture but is responding to flexibility exercises.    Personal Factors and Comorbidities Comorbidity 3+;Fitness;Time since onset of injury/illness/exacerbation;Past/Current Experience    Comorbidities RT THA, Left TTA, left THA (05/23/20), arthritis, DM2, HTN, HLD, PVD    Examination-Activity Limitations Locomotion Level;Squat;Stairs;Stand;Transfers;Lift;Sleep    Examination-Participation Restrictions Community Activity;Occupation;Driving;Shop    Stability/Clinical Decision Making Evolving/Moderate complexity    Rehab Potential Good    PT Frequency 2x / week    PT Duration 8 weeks    PT Treatment/Interventions ADLs/Self Care Home Management;DME Instruction;Gait training;Stair training;Functional mobility  training;Therapeutic activities;Therapeutic exercise;Balance training;Neuromuscular re-education;Patient/family education;Prosthetic Training;Vestibular;Dry needling;Manual techniques;Electrical Stimulation;Cryotherapy;Ultrasound;Passive range of motion;Joint Manipulations    PT Next Visit Plan cont to work on Rt hip ROM and strengthening progress to standing as tolerated, gait and balance    PT Home Exercise Plan squats at counter top with UE support and chair behind him, standing hip flexion and abd with red band, standing hip flexor stretch    Consulted and Agree with Plan of Care Patient             Patient will benefit from skilled therapeutic intervention in order to improve the following deficits and impairments:  Abnormal gait, Decreased activity tolerance, Decreased balance, Decreased endurance, Decreased knowledge of use of DME, Decreased mobility, Decreased range of motion, Decreased skin integrity, Decreased strength, Increased edema, Postural dysfunction, Prosthetic Dependency, Pain, Impaired flexibility  Visit Diagnosis: Pain in right hip  Other abnormalities of gait and mobility  Unsteadiness on feet  Stiffness of right hip, not elsewhere classified  Muscle weakness (generalized)     Problem List Patient Active Problem List   Diagnosis Date Noted   S/P BKA (below knee amputation) unilateral, left (HCC) 10/03/2020  Non-healing wound of lower extremity 10/03/2020   History of amputation of left foot through metatarsal bone (HCC) 09/11/2020   PAD (peripheral artery disease) (HCC) 08/20/2020   Status post total replacement of left hip 06/05/2020   Unilateral primary osteoarthritis, right hip 04/17/2020   Unilateral primary osteoarthritis, left hip 04/17/2020   Amputated toe, right (HCC) 11/22/2018   Type 2 diabetes mellitus with foot ulcer, without long-term current use of insulin (HCC) 11/22/2018   Essential hypertension 11/22/2018   Mixed hyperlipidemia  11/22/2018   Morbid obesity (HCC) 11/22/2018   Aneurysm of left popliteal artery (HCC) 11/22/2018   Status post peripheral artery angioplasty with insertion of stent 11/22/2018   Encounter for smoking cessation counseling 11/22/2018   Osteomyelitis of third toe of right foot (HCC)    Cellulitis of third toe of right foot     Vladimir Faster, PT, DPT 09/02/2021, 12:08 PM  [facility] Physical Therapy [address] [place], [place], [address] Phone: [phone]   Fax:  [phone]  Name: Anthony Park MRN: [id number] Date of Birth: 07-Jul-1961

## 2021-09-09 ENCOUNTER — Other Ambulatory Visit: Payer: Self-pay

## 2021-09-09 ENCOUNTER — Ambulatory Visit (INDEPENDENT_AMBULATORY_CARE_PROVIDER_SITE_OTHER): Payer: BC Managed Care – PPO | Admitting: Physical Therapy

## 2021-09-09 ENCOUNTER — Encounter: Payer: Self-pay | Admitting: Physical Therapy

## 2021-09-09 DIAGNOSIS — R2681 Unsteadiness on feet: Secondary | ICD-10-CM

## 2021-09-09 DIAGNOSIS — R2689 Other abnormalities of gait and mobility: Secondary | ICD-10-CM | POA: Diagnosis not present

## 2021-09-09 DIAGNOSIS — M25651 Stiffness of right hip, not elsewhere classified: Secondary | ICD-10-CM

## 2021-09-09 DIAGNOSIS — M25551 Pain in right hip: Secondary | ICD-10-CM

## 2021-09-09 DIAGNOSIS — M6281 Muscle weakness (generalized): Secondary | ICD-10-CM

## 2021-09-09 NOTE — Therapy (Signed)
[facility] Physical Therapy [address] [place], [place], [address] Phone: [phone]   Fax:  [phone]  Physical Therapy Treatment  Patient Details  Name: Anthony Park MRN: [id number] Date of Birth: August 26, 1961 Referring Provider (PT): Kriste Basque   Encounter Date: 09/09/2021   PT End of Session - 09/09/21 0758     Visit Number 7    Number of Visits 16    Date for PT Re-Evaluation 10/13/21    Authorization Type BCBS comm ppo    Authorization - Visit Number 31    Authorization - Number of Visits 116    PT Start Time 0758    PT Stop Time 0845    PT Time Calculation (min) 47 min    Activity Tolerance Patient tolerated treatment well    Behavior During Therapy [facility] for tasks assessed/performed             Past Medical History:  Diagnosis Date   Arthritis    Diabetes mellitus without complication (HCC)    type 2 controlled with diet    Diabetic toe ulcer (HCC) 11/05/2018   History of kidney stones    passed 1 several years ago   Hyperlipidemia    Hypertension    not on medications   Peripheral vascular disease (HCC)    stent in left leg due to anerysm     Past Surgical History:  Procedure Laterality Date    Left Transmetatarsal Ampuation (Left Foot)  08/20/2020   ABDOMINAL AORTOGRAM W/LOWER EXTREMITY N/A 08/06/2020   Procedure: ABDOMINAL AORTOGRAM W/LOWER EXTREMITY;  Surgeon: [name], MD;  [facility]: [facility];  Service: Cardiovascular;  Laterality: N/A;   AMPUTATION Right 11/07/2018   Procedure: RIGHT 3RD TOE AMPUTATION;  Surgeon: Nadara Mustard, MD;  Location: [facility] OR;  Service: Orthopedics;  Laterality: Right;   AMPUTATION Left 08/20/2020   Procedure: Left Transmetatarsal Ampuation;  Surgeon: [name], MD;  Location: [facility] OR;  Service: Vascular;  Laterality: Left;   AMPUTATION Left 10/03/2020   Procedure: LEFT BELOW KNEE AMPUTATION;  Surgeon: [name], MD;  Location: [facility] OR;  Service: Vascular;   Laterality: Left;   AMPUTATION TOE Left    big toe and one next to it   CATARACT EXTRACTION Bilateral    EYE SURGERY Bilateral    cataracts removed   INSERTION OF ILIAC STENT Left    PERIPHERAL VASCULAR BALLOON ANGIOPLASTY Left 08/06/2020   Procedure: PERIPHERAL VASCULAR BALLOON ANGIOPLASTY;  Surgeon: [name], MD;  Location: [facility];  Service: Cardiovascular;  Laterality: Left;  Peroneal artery.   TOE AMPUTATION Left 2015   TOTAL HIP ARTHROPLASTY Left 05/23/2020   Procedure: LEFT TOTAL HIP ARTHROPLASTY ANTERIOR APPROACH;  Surgeon: Kathryne Hitch, MD;  Location: [facility]S;  Service: Orthopedics;  Laterality: Left;   TOTAL HIP ARTHROPLASTY Right 07/31/2021   Procedure: RIGHT TOTAL HIP ARTHROPLASTY ANTERIOR APPROACH;  Surgeon: Kathryne Hitch, MD;  Location: [facility] ORS;  Service: Orthopedics;  Laterality: Right;   WOUND DEBRIDEMENT Left 09/11/2020   Procedure: DEBRIDEMENT OF LEFT TRANSMETATARSAL WOUND;  Surgeon: Leonie Douglas, MD;  Location: [facility] OR;  Service: Vascular;  Laterality: Left;    There were no vitals filed for this visit.   Subjective Assessment - 09/09/21 0758     Subjective He had two good days & two bad days with residual limb pain from prosthesis. His hip is fine.    Pertinent History Left TTTA, left THA (05/23/20), arthritis, DM2, HTN,  HLD, PVD    Patient Stated Goals Wants to use prosthesis to return to some work, get back in community, be mobile, work in gym in his garage    Currently in Pain? No/denies    Pain Score 0-No pain   He was awaken one night with hip pain resolved with meds.  He does not remember any cause.   Pain Location Hip    Pain Orientation Right    Pain Onset More than a month ago    Multiple Pain Sites Yes    Pain Score 4   over last week, lowest 0/10 - highest 10/10   Pain Location Leg   residual limb   Pain Orientation Left;Anterior    Pain Descriptors / Indicators Other (Comment)   bee stings & rope burns    Pain Onset More than a month ago    Pain Frequency Intermittent    Aggravating Factors  prosthetic pressure    Pain Relieving Factors using oil as PT recommended over bone only,  tylenol & Rx meds    Effect of Pain on Daily Activities weight bearing on prosthesis                               [facility] Adult PT Treatment/Exercise - 09/09/21 0759       Ambulation/Gait   Ambulation/Gait Yes    Ambulation/Gait Assistance 5: Supervision    Ambulation Distance (Feet) 75 Feet   50' & 75'   Assistive device Straight cane;Prosthesis    Ramp 5: Supervision;4: Min assist   descend minA more from TTA tibial pain not balance, ascend supervision.  with cane & TTA prosthesis   Curb 5: Supervision   cane & TTA prosthesis     Neuro Re-ed    Neuro Re-ed Details  stepping to 3 cones (ant-lat, lat, post-lat) leading with RLE 5reps & LLE 5 reps using cane RUE and intermittent LUE //bar      Knee/Hip Exercises: Stretches   Active Hamstring Stretch Both;2 reps;30 seconds    Active Hamstring Stretch Limitations seated SLR with strap DF    Hip Flexor Stretch Right;3 reps;30 seconds    Hip Flexor Stretch Limitations supine LLE knee to chest & RLE over edge      Knee/Hip Exercises: Aerobic   Nustep Seat 12 level 8 with UE/LE X 8 min      Knee/Hip Exercises: Machines for Strengthening   Total Gym Leg Press Shuttle leg press 131# DL 3U20 1st set back 45* position 3 & 2nd set back flat to change hip position 2,  back flat RLE only 75# 15 reps 2 sets focus on maximal hip ext, RLE heel raise 75# 20 reps      Knee/Hip Exercises: Standing   Functional Squat 1 set;15 reps   sink support BUEs   Functional Squat Limitations focus on glut set with hip ext upon arising.    Other Standing Knee Exercises RLE stance with focus on maintain upright without [place] or hip flexion - BUE support on //bars using mirror for feedback  5 reps moving LLE knee bent into flexion, abduction, extension &  adduction. PT demo & verbal cues on technique      Knee/Hip Exercises: Supine   Single Leg Bridge Strengthening;Right;1 set;15 reps;Limitations   pelvic rotation moving left ASIS from posterior to anterior rotation,  PT stabilizing RLE laterally to prevent hip rotation facilitating pelvic motion.     Knee/Hip  Exercises: Sidelying   Clams RLE 20 reps with cues visually (target) tactile & verbally to decrease pelvic rotation creating movement.      Prosthetics   Current prosthetic wear tolerance (days/week)  daily    Current prosthetic wear tolerance (#hours/day)  most of awake hours    Residual limb condition  residual l imb pain along tibial crest up to 6/10    Education Provided --   see prosthetic care commments                      PT Short Term Goals - 09/09/21 0854       PT SHORT TERM GOAL #1   Title He will be I and compliant with HEP    Time 4    Period Weeks    Status On-going    Target Date 09/15/21      PT SHORT TERM GOAL #2   Title He will be able to ambulate 200 feet with SPC mod I to supervision    Time 4    Period Weeks    Status On-going    Target Date 09/15/21      PT SHORT TERM GOAL #3   Title -      PT SHORT TERM GOAL #4   Title -               PT Long Term Goals - 09/09/21 0854       PT LONG TERM GOAL #1   Title He will improve FOTO functional score to 59%    Time 8    Period Weeks    Status On-going    Target Date 10/13/21      PT LONG TERM GOAL #2   Title Patient will improve Rt hip strength to 5/5 MMT all planes for functional activity    Baseline 4/5    Time 8    Period Weeks    Status On-going    Target Date 10/13/21      PT LONG TERM GOAL #3   Title Berg Balance >/= 43/56 to indicate lower fall risk    Baseline Berg 39/56    Time 8    Period Weeks    Status On-going      PT LONG TERM GOAL #4   Title Patient ambulates 300' with SPC to no AD    Baseline can do this with rollator    Time 8    Period Weeks     Status On-going      PT LONG TERM GOAL #5   Title Patient will improve Rt hip pain to overall less than 3/10 with ususal activity    Baseline 4 today, can still get up to 10    Time 8    Period Weeks    Status On-going    Target Date 10/13/21                   Plan - 09/09/21 0758     Clinical Impression Statement PT increased difficulty of right hip strength & balance for functional activities which he improved with instruction & repetition.  He continues to be limited by residual limb pain from prosthesis.  He is using shrinker at night to maintain limb volumes. Some mornings he has to work to [employer] prosthesis with 0-ply socks therefore pads in socket can not be increased.  He will have prosthesis 6 months on 10/17/2021 when he is elgible for socket revision.  This  PT recommends changing socket design to suction sleeve so no umbrella or hard cap as part of interface.    Personal Factors and Comorbidities Comorbidity 3+;Fitness;Time since onset of injury/illness/exacerbation;Past/Current Experience    Comorbidities RT THA, Left TTA, left THA (05/23/20), arthritis, DM2, HTN, HLD, PVD    Examination-Activity Limitations Locomotion Level;Squat;Stairs;Stand;Transfers;Lift;Sleep    Examination-Participation Restrictions Community Activity;Occupation;Driving;Shop    Stability/Clinical Decision Making Evolving/Moderate complexity    Rehab Potential Good    PT Frequency 2x / week    PT Duration 8 weeks    PT Treatment/Interventions ADLs/Self Care Home Management;DME Instruction;Gait training;Stair training;Functional mobility training;Therapeutic activities;Therapeutic exercise;Balance training;Neuromuscular re-education;Patient/family education;Prosthetic Training;Vestibular;Dry needling;Manual techniques;Electrical Stimulation;Cryotherapy;Ultrasound;Passive range of motion;Joint Manipulations    PT Next Visit Plan New year so check insurance changes & restart visit authorization count,   check STGs, cont to work on Rt hip ROM and strengthening progress to standing as tolerated, gait and balance    PT Home Exercise Plan squats at counter top with UE support and chair behind him, standing hip flexion and abd with red band, standing hip flexor stretch    Consulted and Agree with Plan of Care Patient             Patient will benefit from skilled therapeutic intervention in order to improve the following deficits and impairments:  Abnormal gait, Decreased activity tolerance, Decreased balance, Decreased endurance, Decreased knowledge of use of DME, Decreased mobility, Decreased range of motion, Decreased skin integrity, Decreased strength, Increased edema, Postural dysfunction, Prosthetic Dependency, Pain, Impaired flexibility  Visit Diagnosis: Pain in right hip  Other abnormalities of gait and mobility  Unsteadiness on feet  Stiffness of right hip, not elsewhere classified  Muscle weakness (generalized)     Problem List Patient Active Problem List   Diagnosis Date Noted   S/P BKA (below knee amputation) unilateral, left (HCC) 10/03/2020   Non-healing wound of lower extremity 10/03/2020   History of amputation of left foot through metatarsal bone (HCC) 09/11/2020   PAD (peripheral artery disease) (HCC) 08/20/2020   Status post total replacement of left hip 06/05/2020   Unilateral primary osteoarthritis, right hip 04/17/2020   Unilateral primary osteoarthritis, left hip 04/17/2020   Amputated toe, right (HCC) 11/22/2018   Type 2 diabetes mellitus with foot ulcer, without long-term current use of insulin (HCC) 11/22/2018   Essential hypertension 11/22/2018   Mixed hyperlipidemia 11/22/2018   Morbid obesity (HCC) 11/22/2018   Aneurysm of left popliteal artery (HCC) 11/22/2018   Status post peripheral artery angioplasty with insertion of stent 11/22/2018   Encounter for smoking cessation counseling 11/22/2018   Osteomyelitis of third toe of right foot (HCC)     Cellulitis of third toe of right foot     Vladimir Faster, PT, DPT 09/09/2021, 9:02 AM  [facility] Physical Therapy [address] [place], [place], [address] Phone: [phone]   Fax:  [phone]  Name: MITCHELL IWANICKI MRN: [id number] Date of Birth: 07-Jun-1961

## 2021-09-10 ENCOUNTER — Encounter: Payer: Self-pay | Admitting: Physician Assistant

## 2021-09-10 ENCOUNTER — Ambulatory Visit (INDEPENDENT_AMBULATORY_CARE_PROVIDER_SITE_OTHER): Payer: BC Managed Care – PPO | Admitting: Physician Assistant

## 2021-09-10 DIAGNOSIS — Z96641 Presence of right artificial hip joint: Secondary | ICD-10-CM

## 2021-09-10 NOTE — Progress Notes (Signed)
HPI: Anthony Park returns today status post right total hip arthroplasty 08/01/2021.  He states overall he is doing well.  He has pain early in the right hip whenever he is lying on the hip.  Rates his pain to be a 9 out of 10 pain at worst.  He states that he really does not have a lot of pain whenever he is up and about.  He still having some muscle spasms at times he is taking Robaxin.  He is on chronic Neurontin.  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  Is having some problems with donning his left prosthetic leg due to swelling.  Review of systems: See HPI otherwise negative  Physical exam: General well-developed well-nourished male no acute distress seated in wheelchair today. Right hip fluid but somewhat limited external/internal rotation.  Right calf supple nontender.  Dorsiflexion plantarflexion right ankle intact. Left lower leg BKA with slight erythema no abnormal warmth.  Well-healing scab over the incision line.  No signs of gross infection.  Impression: Status post right total hip arthroplasty 6 weeks  Plan: He will continue to work with physical therapy on range of motion strengthening right hip.  We will see him back in 3 months AP pelvis lateral view of the right hip at that time.  Follow-up with [place] sooner if there is any questions concerns.  In regards to his left BKA he is to wash the area with antibacterial soap.  He is given by Dr. Lajoyce Corners shrinker sock.  He can wear this whenever he is not wearing his leg.  He is to take it off at night.  Questions were encouraged and answered

## 2021-09-16 ENCOUNTER — Ambulatory Visit (INDEPENDENT_AMBULATORY_CARE_PROVIDER_SITE_OTHER): Payer: BC Managed Care – PPO | Admitting: Physical Therapy

## 2021-09-16 ENCOUNTER — Encounter: Payer: Self-pay | Admitting: Physical Therapy

## 2021-09-16 ENCOUNTER — Other Ambulatory Visit: Payer: Self-pay

## 2021-09-16 DIAGNOSIS — R2681 Unsteadiness on feet: Secondary | ICD-10-CM

## 2021-09-16 DIAGNOSIS — R2689 Other abnormalities of gait and mobility: Secondary | ICD-10-CM

## 2021-09-16 DIAGNOSIS — M25651 Stiffness of right hip, not elsewhere classified: Secondary | ICD-10-CM

## 2021-09-16 DIAGNOSIS — M25551 Pain in right hip: Secondary | ICD-10-CM | POA: Diagnosis not present

## 2021-09-16 DIAGNOSIS — M6281 Muscle weakness (generalized): Secondary | ICD-10-CM

## 2021-09-16 NOTE — Therapy (Signed)
[facility] Physical Therapy [address] [place], [place], [address] Phone: [phone]   Fax:  [phone]  Physical Therapy Treatment  Patient Details  Name: Anthony Park MRN: [id number] Date of Birth: Mar 22, 1961 Referring Provider (PT): Carney Bern   Encounter Date: 09/16/2021   PT End of Session - 09/16/21 0920     Visit Number 8    Number of Visits 16    Date for PT Re-Evaluation 10/13/21    Authorization Type [employer] comm ppo    Authorization - Visit Number 74    Authorization - Number of Visits [id number]    PT Start Time 0848    PT Stop Time 0928    PT Time Calculation (min) 40 min    Activity Tolerance Patient tolerated treatment well    Behavior During Therapy [facility] for tasks assessed/performed             Past Medical History:  Diagnosis Date   Arthritis    Diabetes mellitus without complication ([place])    type 2 controlled with diet    Diabetic toe ulcer ([place]) 11/05/2018   History of kidney stones    passed 1 several years ago   Hyperlipidemia    Hypertension    not on medications   Peripheral vascular disease ([place])    stent in left leg due to anerysm     Past Surgical History:  Procedure Laterality Date    Left Transmetatarsal Ampuation (Left Foot)  08/20/2020   ABDOMINAL AORTOGRAM W/LOWER EXTREMITY N/A 08/06/2020   Procedure: ABDOMINAL AORTOGRAM W/LOWER EXTREMITY;  Surgeon: Marty Heck, MD;  Location: [place] [facility];  Service: Cardiovascular;  Laterality: N/A;   AMPUTATION Right 11/07/2018   Procedure: RIGHT 3RD TOE AMPUTATION;  Surgeon: Newt Minion, MD;  Location: [place];  Service: Orthopedics;  Laterality: Right;   AMPUTATION Left 08/20/2020   Procedure: Left Transmetatarsal Ampuation;  Surgeon: Marty Heck, MD;  Location: [place];  Service: Vascular;  Laterality: Left;   AMPUTATION Left 10/03/2020   Procedure: LEFT BELOW KNEE AMPUTATION;  Surgeon: Marty Heck, MD;  Location: [place];  Service: Vascular;   Laterality: Left;   AMPUTATION TOE Left    big toe and one next to it   CATARACT EXTRACTION Bilateral    EYE SURGERY Bilateral    cataracts removed   INSERTION OF ILIAC STENT Left    PERIPHERAL VASCULAR BALLOON ANGIOPLASTY Left 08/06/2020   Procedure: PERIPHERAL VASCULAR BALLOON ANGIOPLASTY;  Surgeon: Marty Heck, MD;  Location: [place] [facility];  Service: Cardiovascular;  Laterality: Left;  Peroneal artery.   TOE AMPUTATION Left 2015   TOTAL HIP ARTHROPLASTY Left 05/23/2020   Procedure: LEFT TOTAL HIP ARTHROPLASTY ANTERIOR APPROACH;  Surgeon: Mcarthur Rossetti, MD;  [facility];  Service: Orthopedics;  Laterality: Left;   TOTAL HIP ARTHROPLASTY Right 07/31/2021   Procedure: RIGHT TOTAL HIP ARTHROPLASTY ANTERIOR APPROACH;  Surgeon: Mcarthur Rossetti, MD;  Location: [facility];  Service: Orthopedics;  Laterality: Right;   WOUND DEBRIDEMENT Left 09/11/2020   Procedure: DEBRIDEMENT OF LEFT TRANSMETATARSAL WOUND;  Surgeon: Cherre Robins, MD;  Location: [place];  Service: Vascular;  Laterality: Left;    There were no vitals filed for this visit.   Subjective Assessment - 09/16/21 0919     Subjective relays no pain in his Rt hip upon arrival. He does complain of some pain in residual limb so he has to take breaks from wearing his prosthesis    Pertinent  History Left TTTA, left THA (05/23/20), arthritis, DM2, HTN, HLD, PVD    Patient Stated Goals Wants to use prosthesis to return to some work, get back in community, be mobile, work in gym in his garage    Pain Onset More than a month ago    Pain Onset More than a month ago                               [facility] Adult PT Treatment/Exercise - 09/16/21 0001       Ambulation/Gait   Ambulation/Gait Yes    Ambulation/Gait Assistance 5: Supervision    Ambulation Distance (Feet) 200 Feet   also walked 30 feet without AD and supervision   Assistive device Straight cane;Prosthesis      Knee/Hip  Exercises: Stretches   Active Hamstring Stretch Both;2 reps;30 seconds    Active Hamstring Stretch Limitations seated SLR with strap DF    Hip Flexor Stretch Right;3 reps;30 seconds    Hip Flexor Stretch Limitations supine LLE knee to chest & RLE over edge      Knee/Hip Exercises: Aerobic   Nustep Seat 12 level 8 with UE/LE X 10 min      Knee/Hip Exercises: Machines for Strengthening   Total Gym Leg Press Shuttle leg press 131# DL 2X15 RLE only 75# 15 reps 2 sets focus on maximal hip ext,      Knee/Hip Exercises: Standing   Functional Squat Limitations 15# kettlebell goblet squat with glute touch  to 23 inch mat surface 2X10      Knee/Hip Exercises: Seated   Other Seated Knee/Hip Exercises Rt hip IR and ER green X20 ea      Knee/Hip Exercises: Supine   Single Leg Bridge Strengthening;Right;1 set;15 reps;Limitations   holding 5 sec   Straight Leg Raises Right;15 reps      Knee/Hip Exercises: Sidelying   Hip ABduction Right;15 reps    Clams RLE 20 reps with cues visually (target) tactile & verbally to decrease pelvic rotation creating movement.    Other Sidelying Knee/Hip Exercises reverse clam on Rt X20 reps                       PT Short Term Goals - 09/16/21 0937       PT SHORT TERM GOAL #1   Title He will be I and compliant with HEP    Time 4    Period Weeks    Status Achieved    Target Date 09/15/21      PT SHORT TERM GOAL #2   Title He will be able to ambulate 200 feet with SPC mod I to supervision    Baseline met 1/4    Time 4    Period Weeks    Status Achieved    Target Date 09/15/21      PT SHORT TERM GOAL #3   Title -      PT SHORT TERM GOAL #4   Title -               PT Long Term Goals - 09/09/21 0854       PT LONG TERM GOAL #1   Title He will improve FOTO functional score to 59%    Time 8    Period Weeks    Status On-going    Target Date 10/13/21      PT LONG TERM GOAL #2   Title  Patient will improve Rt hip strength to 5/5  MMT all planes for functional activity    Baseline 4/5    Time 8    Period Weeks    Status On-going    Target Date 10/13/21      PT LONG TERM GOAL #3   Title Berg Balance >/= 43/56 to indicate lower fall risk    Baseline Berg 39/56    Time 8    Period Weeks    Status On-going      PT LONG TERM GOAL #4   Title Patient ambulates 300' with SPC to no AD    Baseline can do this with rollator    Time 8    Period Weeks    Status On-going      PT LONG TERM GOAL #5   Title Patient will improve Rt hip pain to overall less than 3/10 with ususal activity    Baseline 4 today, can still get up to 10    Time 8    Period Weeks    Status On-going    Target Date 10/13/21                   Plan - 09/16/21 5993     Clinical Impression Statement We focused on strengthening today with good overall tolerance. He does still have some weakness noted with hip flexors, extensors, and abductors so PT recommending to continue POC. He has now met his short term PT goals.    Personal Factors and Comorbidities Comorbidity 3+;Fitness;Time since onset of injury/illness/exacerbation;Past/Current Experience    Comorbidities RT THA, Left TTA, left THA (05/23/20), arthritis, DM2, HTN, HLD, PVD    Examination-Activity Limitations Locomotion Level;Squat;Stairs;Stand;Transfers;Lift;Sleep    Examination-Participation Restrictions Community Activity;Occupation;Driving;Shop    Stability/Clinical Decision Making Evolving/Moderate complexity    Rehab Potential Good    PT Frequency 2x / week    PT Duration 8 weeks    PT Treatment/Interventions ADLs/Self Care Home Management;DME Instruction;Gait training;Stair training;Functional mobility training;Therapeutic activities;Therapeutic exercise;Balance training;Neuromuscular re-education;Patient/family education;Prosthetic Training;Vestibular;Dry needling;Manual techniques;Electrical Stimulation;Cryotherapy;Ultrasound;Passive range of motion;Joint Manipulations    PT  Next Visit Plan New year so check insurance changes & restart visit authorization count,  check STGs, cont to work on Rt hip ROM and strengthening progress to standing as tolerated, gait and balance    PT Home Exercise Plan squats at counter top with UE support and chair behind him, standing hip flexion and abd with red band, standing hip flexor stretch    Consulted and Agree with Plan of Care Patient             Patient will benefit from skilled therapeutic intervention in order to improve the following deficits and impairments:  Abnormal gait, Decreased activity tolerance, Decreased balance, Decreased endurance, Decreased knowledge of use of DME, Decreased mobility, Decreased range of motion, Decreased skin integrity, Decreased strength, Increased edema, Postural dysfunction, Prosthetic Dependency, Pain, Impaired flexibility  Visit Diagnosis: Pain in right hip  Other abnormalities of gait and mobility  Unsteadiness on feet  Stiffness of right hip, not elsewhere classified  Muscle weakness (generalized)     Problem List Patient Active Problem List   Diagnosis Date Noted   S/P BKA (below knee amputation) unilateral, left ([place]) 10/03/2020   Non-healing wound of lower extremity 10/03/2020   History of amputation of left foot through metatarsal bone ([place]) 09/11/2020   PAD (peripheral artery disease) ([place]) 08/20/2020   Status post total replacement of left hip 06/05/2020   Unilateral primary osteoarthritis, right  hip 04/17/2020   Unilateral primary osteoarthritis, left hip 04/17/2020   Amputated toe, right ([place]) 11/22/2018   Type 2 diabetes mellitus with foot ulcer, without long-term current use of insulin ([place]) 11/22/2018   Mixed hyperlipidemia 11/22/2018   Aneurysm of left popliteal artery ([place]) 11/22/2018   Status post peripheral artery angioplasty with insertion of stent 11/22/2018   Osteomyelitis of third toe of right foot ([place])    Cellulitis of third toe of right foot      Debbe Odea, PT,DPT 09/16/2021, 9:38 AM  [facility] Physical Therapy [address] [place], [place], [address] Phone: [phone]   Fax:  [phone]  Name: FENRIS CAUBLE MRN: [id number] Date of Birth: 08-10-1961

## 2021-09-21 ENCOUNTER — Other Ambulatory Visit: Payer: Self-pay

## 2021-09-21 ENCOUNTER — Encounter: Payer: Self-pay | Admitting: Orthopedic Surgery

## 2021-09-21 ENCOUNTER — Encounter: Payer: Self-pay | Admitting: Physical Therapy

## 2021-09-21 ENCOUNTER — Ambulatory Visit (INDEPENDENT_AMBULATORY_CARE_PROVIDER_SITE_OTHER): Payer: BC Managed Care – PPO | Admitting: Orthopedic Surgery

## 2021-09-21 ENCOUNTER — Ambulatory Visit (INDEPENDENT_AMBULATORY_CARE_PROVIDER_SITE_OTHER): Payer: BC Managed Care – PPO | Admitting: Physical Therapy

## 2021-09-21 DIAGNOSIS — M25651 Stiffness of right hip, not elsewhere classified: Secondary | ICD-10-CM | POA: Diagnosis not present

## 2021-09-21 DIAGNOSIS — R2689 Other abnormalities of gait and mobility: Secondary | ICD-10-CM

## 2021-09-21 DIAGNOSIS — M25551 Pain in right hip: Secondary | ICD-10-CM

## 2021-09-21 DIAGNOSIS — Z89512 Acquired absence of left leg below knee: Secondary | ICD-10-CM | POA: Diagnosis not present

## 2021-09-21 DIAGNOSIS — R2681 Unsteadiness on feet: Secondary | ICD-10-CM

## 2021-09-21 DIAGNOSIS — M6281 Muscle weakness (generalized): Secondary | ICD-10-CM

## 2021-09-21 DIAGNOSIS — S88112A Complete traumatic amputation at level between knee and ankle, left lower leg, initial encounter: Secondary | ICD-10-CM

## 2021-09-21 MED ORDER — DOXYCYCLINE HYCLATE 100 MG PO TABS: 100.0000 mg | ORAL_TABLET | Freq: Two times a day (BID) | ORAL | 0 refills | Status: DC

## 2021-09-21 NOTE — Progress Notes (Signed)
Office Visit Note   Patient: Anthony Park           Date of Birth: 08-16-1961           MRN: [id number] Visit Date: 09/21/2021              Requested by: Michael Boston, MD [address] [place],  [place] [address] PCP: Michael Boston, MD  Chief Complaint  Patient presents with   Left Leg - Follow-up    10/03/20 left BKA pain      HPI: Patient is 11 months status post left transtibial amputation patient has been having some end bearing drainage and pain.  He feels like he is sliding down into his socket.  Assessment & Plan: Visit Diagnoses:  1. Below-knee amputation of left lower extremity ([place])     Plan: Will call in a prescription for doxycycline.  He will use his stump shrinker under the liner to prevent from subsiding into the socket.  Anticipate at follow-up patient will need a new socket.  Follow-Up Instructions: Return in about 2 weeks (around 10/05/2021).   Ortho Exam  Patient is alert, oriented, no adenopathy, well-dressed, normal affect, normal respiratory effort. Examination there is some end bearing redness on the residual limb there is a very small open wound but this does not appear here to be from a retained suture.  Patient does have redness over the residual limb that appears to be from an bearing pressure.  Imaging: No results found. No images are attached to the encounter.  Labs: Lab Results  Component Value Date   HGBA1C 6.5 (H) 07/24/2021   HGBA1C 6.2 (H) 10/03/2020   HGBA1C 6.8 (H) 05/20/2020   CRP 3.8 (H) 10/03/2020   CRP 53.7 (H) 09/23/2020   LABURIC 6.9 07/29/2020   REPTSTATUS 09/16/2020 FINAL 09/11/2020   GRAMSTAIN  09/11/2020    RARE WBC PRESENT, PREDOMINANTLY MONONUCLEAR MODERATE GRAM NEGATIVE RODS FEW GRAM VARIABLE ROD    CULT  09/11/2020    MODERATE PSEUDOMONAS AERUGINOSA RARE ESCHERICHIA COLI NO ANAEROBES ISOLATED Performed at [facility], [place] [address], [place], [place] [address]    LABORGA PSEUDOMONAS AERUGINOSA  09/11/2020   LABORGA ESCHERICHIA COLI 09/11/2020     Lab Results  Component Value Date   ALBUMIN 3.4 (L) 10/03/2020   ALBUMIN 3.6 09/11/2020   ALBUMIN 3.5 11/06/2018    Lab Results  Component Value Date   MG 1.9 08/24/2020   MG 1.6 (L) 08/23/2020   No results found for: VD25OH  No results found for: PREALBUMIN CBC EXTENDED Latest Ref Rng & Units 07/24/2021 10/06/2020 10/05/2020  WBC 4.0 - 10.5 K/uL 8.4 7.4 8.0  RBC 4.22 - 5.81 MIL/uL 4.89 2.83(L) 2.80(L)  HGB 13.0 - 17.0 g/dL 13.2 8.1(L) 8.1(L)  HCT 39.0 - 52.0 % 40.4 26.1(L) 25.4(L)  PLT 150 - 400 K/uL 240 254 262  NEUTROABS 1.7 - 7.7 K/uL - - -  LYMPHSABS 0.7 - 4.0 K/uL - - -     There is no height or weight on file to calculate BMI.  Orders:  No orders of the defined types were placed in this encounter.  No orders of the defined types were placed in this encounter.    Procedures: No procedures performed  Clinical Data: No additional findings.  ROS:  All other systems negative, except as noted in the HPI. Review of Systems  Objective: Vital Signs: There were no vitals taken for this visit.  Specialty Comments:  No  specialty comments available.  PMFS History: Patient Active Problem List   Diagnosis Date Noted   S/P BKA (below knee amputation) unilateral, left ([place]) 10/03/2020   Non-healing wound of lower extremity 10/03/2020   History of amputation of left foot through metatarsal bone ([place]) 09/11/2020   PAD (peripheral artery disease) ([place]) 08/20/2020   Status post total replacement of left hip 06/05/2020   Unilateral primary osteoarthritis, right hip 04/17/2020   Unilateral primary osteoarthritis, left hip 04/17/2020   Amputated toe, right ([place]) 11/22/2018   Type 2 diabetes mellitus with foot ulcer, without long-term current use of insulin ([place]) 11/22/2018   Mixed hyperlipidemia 11/22/2018   Aneurysm of left popliteal artery ([place]) 11/22/2018   Status post peripheral artery angioplasty with  insertion of stent 11/22/2018   Osteomyelitis of third toe of right foot ([facility])    Cellulitis of third toe of right foot    Past Medical History:  Diagnosis Date   Arthritis    Diabetes mellitus without complication ([place])    type 2 controlled with diet    Diabetic toe ulcer ([place]) 11/05/2018   History of kidney stones    passed 1 several years ago   Hyperlipidemia    Hypertension    not on medications   Peripheral vascular disease ([place])    stent in left leg due to anerysm     Family History  Problem Relation Age of Onset   COPD Mother    Cataracts Mother    Brain cancer Father    Hypertension Brother     Past Surgical History:  Procedure Laterality Date    Left Transmetatarsal Ampuation (Left Foot)  08/20/2020   ABDOMINAL AORTOGRAM W/LOWER EXTREMITY N/A 08/06/2020   Procedure: ABDOMINAL AORTOGRAM W/LOWER EXTREMITY;  Surgeon: Marty Heck, MD;  Location: [place] [facility];  Service: Cardiovascular;  Laterality: N/A;   AMPUTATION Right 11/07/2018   Procedure: RIGHT 3RD TOE AMPUTATION;  Surgeon: Newt Minion, MD;  Location: [place];  Service: Orthopedics;  Laterality: Right;   AMPUTATION Left 08/20/2020   Procedure: Left Transmetatarsal Ampuation;  Surgeon: Marty Heck, MD;  Location: [place];  Service: Vascular;  Laterality: Left;   AMPUTATION Left 10/03/2020   Procedure: LEFT BELOW KNEE AMPUTATION;  Surgeon: Marty Heck, MD;  Location: [place];  Service: Vascular;  Laterality: Left;   AMPUTATION TOE Left    big toe and one next to it   CATARACT EXTRACTION Bilateral    EYE SURGERY Bilateral    cataracts removed   INSERTION OF ILIAC STENT Left    PERIPHERAL VASCULAR BALLOON ANGIOPLASTY Left 08/06/2020   Procedure: PERIPHERAL VASCULAR BALLOON ANGIOPLASTY;  Surgeon: Marty Heck, MD;  Location: [place] [facility];  Service: Cardiovascular;  Laterality: Left;  Peroneal artery.   TOE AMPUTATION Left 2015   TOTAL HIP ARTHROPLASTY Left 05/23/2020    Procedure: LEFT TOTAL HIP ARTHROPLASTY ANTERIOR APPROACH;  Surgeon: Mcarthur Rossetti, MD;  [facility];  Service: Orthopedics;  Laterality: Left;   TOTAL HIP ARTHROPLASTY Right 07/31/2021   Procedure: RIGHT TOTAL HIP ARTHROPLASTY ANTERIOR APPROACH;  Surgeon: Mcarthur Rossetti, MD;  [facility];  Service: Orthopedics;  Laterality: Right;   WOUND DEBRIDEMENT Left 09/11/2020   Procedure: DEBRIDEMENT OF LEFT TRANSMETATARSAL WOUND;  Surgeon: Cherre Robins, MD;  Location: [facility];  Service: Vascular;  Laterality: Left;   Social History   Occupational History   Not on file  Tobacco Use   Smoking status: Former  Packs/day: 0.50    Years: 41.00    Pack years: 20.50    Types: Cigarettes    Quit date: 07/20/2020    Years since quitting: 1.1   Smokeless tobacco: Never  Vaping Use   Vaping Use: Never used  Substance and Sexual Activity   Alcohol use: Yes    Alcohol/week: 14.0 standard drinks    Types: 14 Standard drinks or equivalent per week    Comment: occas.   Drug use: Not Currently    Comment: hx of marijuana 15 years ago    Sexual activity: Yes

## 2021-09-21 NOTE — Therapy (Signed)
[facility] Physical Therapy [address] [place], [place], [address] Phone: [phone]   Fax:  [phone]  Physical Therapy Treatment  Patient Details  Name: Anthony Park MRN: [id number] Date of Birth: Nov 15, 1960 Referring Provider (PT): Carney Bern   Encounter Date: 09/21/2021   PT End of Session - 09/21/21 0845     Visit Number 9    Number of Visits 16    Date for PT Re-Evaluation 10/13/21    Authorization Type BCBS comm ppo    Authorization Time Period 2023 Deductible ($1,450.00)$0.00 Paid $1,450.00 to go, Out-of-Pocket Limit ($2,650.00)  $0.00 Paid $2,650.00 to go    Authorization - Visit Number 2    Authorization - Number of Visits 116    PT Start Time 0845    PT Stop Time 0926    PT Time Calculation (min) 41 min    Activity Tolerance Patient tolerated treatment well    Behavior During Therapy [facility] for tasks assessed/performed             Past Medical History:  Diagnosis Date   Arthritis    Diabetes mellitus without complication ([place])    type 2 controlled with diet    Diabetic toe ulcer ([place]) 11/05/2018   History of kidney stones    passed 1 several years ago   Hyperlipidemia    Hypertension    not on medications   Peripheral vascular disease ([place])    stent in left leg due to anerysm     Past Surgical History:  Procedure Laterality Date    Left Transmetatarsal Ampuation (Left Foot)  08/20/2020   ABDOMINAL AORTOGRAM W/LOWER EXTREMITY N/A 08/06/2020   Procedure: ABDOMINAL AORTOGRAM W/LOWER EXTREMITY;  Surgeon: Marty Heck, MD;  Location: [place] [facility];  Service: Cardiovascular;  Laterality: N/A;   AMPUTATION Right 11/07/2018   Procedure: RIGHT 3RD TOE AMPUTATION;  Surgeon: Newt Minion, MD;  Location: [place];  Service: Orthopedics;  Laterality: Right;   AMPUTATION Left 08/20/2020   Procedure: Left Transmetatarsal Ampuation;  Surgeon: Marty Heck, MD;  Location: [place];  Service: Vascular;  Laterality: Left;    AMPUTATION Left 10/03/2020   Procedure: LEFT BELOW KNEE AMPUTATION;  Surgeon: Marty Heck, MD;  Location: [place];  Service: Vascular;  Laterality: Left;   AMPUTATION TOE Left    big toe and one next to it   CATARACT EXTRACTION Bilateral    EYE SURGERY Bilateral    cataracts removed   INSERTION OF ILIAC STENT Left    PERIPHERAL VASCULAR BALLOON ANGIOPLASTY Left 08/06/2020   Procedure: PERIPHERAL VASCULAR BALLOON ANGIOPLASTY;  Surgeon: Marty Heck, MD;  Location: [place] [facility];  Service: Cardiovascular;  Laterality: Left;  Peroneal artery.   TOE AMPUTATION Left 2015   TOTAL HIP ARTHROPLASTY Left 05/23/2020   Procedure: LEFT TOTAL HIP ARTHROPLASTY ANTERIOR APPROACH;  Surgeon: Mcarthur Rossetti, MD;  [facility]: [facility];  Service: Orthopedics;  Laterality: Left;   TOTAL HIP ARTHROPLASTY Right 07/31/2021   Procedure: RIGHT TOTAL HIP ARTHROPLASTY ANTERIOR APPROACH;  Surgeon: Mcarthur Rossetti, MD;  [facility];  Service: Orthopedics;  Laterality: Right;   WOUND DEBRIDEMENT Left 09/11/2020   Procedure: DEBRIDEMENT OF LEFT TRANSMETATARSAL WOUND;  Surgeon: Cherre Robins, MD;  Location: [place];  Service: Vascular;  Laterality: Left;    There were no vitals filed for this visit.   Subjective Assessment - 09/21/21 0845     Subjective his residual limb continues to burn on end of shin  which limits his function.    Pertinent History Left TTTA, left THA (05/23/20), arthritis, DM2, HTN, HLD, PVD    Patient Stated Goals Wants to use prosthesis to return to some work, get back in community, be mobile, work in gym in his garage    Currently in Pain? No/denies    Pain Location Hip    Pain Orientation Right    Pain Onset More than a month ago    Multiple Pain Sites Yes    Pain Score 5    Pain Location Leg    Pain Orientation Left;Distal;Anterior    Pain Descriptors / Indicators Burning    Pain Onset More than a month ago    Aggravating Factors  prosthesis pressure     Pain Relieving Factors removing prosthesis                               [facility] Adult PT Treatment/Exercise - 09/21/21 0845       Ambulation/Gait   Ambulation/Gait Yes    Ambulation/Gait Assistance 5: Supervision    Ambulation/Gait Assistance Details pt limited by residual limb pain    Ambulation Distance (Feet) 200 Feet   also walked 30 feet without AD and supervision   Assistive device Straight cane;Prosthesis      Neuro Re-ed    Neuro Re-ed Details  standing on foam tandem stance RLE in front 30 sec and RLE in back 30sec intermittent UE support      Knee/Hip Exercises: Stretches   Active Hamstring Stretch Both;2 reps;30 seconds    Active Hamstring Stretch Limitations seated SLR with strap DF    Hip Flexor Stretch Right;3 reps;30 seconds    Hip Flexor Stretch Limitations supine LLE knee to chest & RLE over edge      Knee/Hip Exercises: Machines for Strengthening   Total Gym Leg Press Shuttle leg press 138# [id number] & RLE only 87# 15 reps 2 sets focus on maximal hip ext,      Knee/Hip Exercises: Standing   SLS with Vectors RLE stance with light UE support: reaching RLE flex/ext 15 reps adnd abd/add 15 reps      Knee/Hip Exercises: Supine   Single Leg Bridge Strengthening;Right;1 set;15 reps;Limitations   holding 5 sec   Straight Leg Raises Right;15 reps      Knee/Hip Exercises: Sidelying   Hip ABduction Right;10 reps    Clams RLE 20 reps with cues visually (target) tactile & verbally to decrease pelvic rotation creating movement.    Other Sidelying Knee/Hip Exercises reverse clam on Rt X20 reps      Prosthetics   Prosthetic Care Comments  PT educated pt on potentially internal suture trying to expel itself.  PT spoke with Dr. Sharol Given who wants to see him. pt unable to stay today but plans to schedule with Dr. Sharol Given.    Residual limb condition  distal tibia has edema, white puffy area, pin prick hole in white area, no drainage noted.    Education Provided  Other (comment)   see prosthetic care comments   Person(s) Educated Patient    Education Method Explanation;Verbal cues    Education Method Verbalized understanding;Verbal cues required;Needs further instruction                       PT Short Term Goals - 09/16/21 0937       PT SHORT TERM GOAL #1   Title He will be  I and compliant with HEP    Time 4    Period Weeks    Status Achieved    Target Date 09/15/21      PT SHORT TERM GOAL #2   Title He will be able to ambulate 200 feet with SPC mod I to supervision    Baseline met 1/4    Time 4    Period Weeks    Status Achieved    Target Date 09/15/21      PT SHORT TERM GOAL #3   Title -      PT SHORT TERM GOAL #4   Title -               PT Long Term Goals - 09/09/21 0854       PT LONG TERM GOAL #1   Title He will improve FOTO functional score to 59%    Time 8    Period Weeks    Status On-going    Target Date 10/13/21      PT LONG TERM GOAL #2   Title Patient will improve Rt hip strength to 5/5 MMT all planes for functional activity    Baseline 4/5    Time 8    Period Weeks    Status On-going    Target Date 10/13/21      PT LONG TERM GOAL #3   Title Berg Balance >/= 43/56 to indicate lower fall risk    Baseline Berg 39/56    Time 8    Period Weeks    Status On-going      PT LONG TERM GOAL #4   Title Patient ambulates 300' with SPC to no AD    Baseline can do this with rollator    Time 8    Period Weeks    Status On-going      PT LONG TERM GOAL #5   Title Patient will improve Rt hip pain to overall less than 3/10 with ususal activity    Baseline 4 today, can still get up to 10    Time 8    Period Weeks    Status On-going    Target Date 10/13/21                   Plan - 09/21/21 0848     Clinical Impression Statement Patient's residual limb pain is limiting standing & gait activities.  He may have an internal suture that is irritated and he needs to see Dr. Sharol Given to assess  area.  His right hip is strengthening and improving function.    Personal Factors and Comorbidities Comorbidity 3+;Fitness;Time since onset of injury/illness/exacerbation;Past/Current Experience    Comorbidities RT THA, Left TTA, left THA (05/23/20), arthritis, DM2, HTN, HLD, PVD    Examination-Activity Limitations Locomotion Level;Squat;Stairs;Stand;Transfers;Lift;Sleep    Examination-Participation Restrictions Community Activity;Occupation;Driving;Shop    Stability/Clinical Decision Making Evolving/Moderate complexity    Rehab Potential Good    PT Frequency 2x / week    PT Duration 8 weeks    PT Treatment/Interventions ADLs/Self Care Home Management;DME Instruction;Gait training;Stair training;Functional mobility training;Therapeutic activities;Therapeutic exercise;Balance training;Neuromuscular re-education;Patient/family education;Prosthetic Training;Vestibular;Dry needling;Manual techniques;Electrical Stimulation;Cryotherapy;Ultrasound;Passive range of motion;Joint Manipulations    PT Next Visit Plan check if he scheduled to see Dr. Sharol Given, cont to work on Rt hip ROM and strengthening progress to standing as tolerated, gait and balance    PT Home Exercise Plan squats at counter top with UE support and chair behind him, standing hip flexion and abd with red band,  standing hip flexor stretch    Consulted and Agree with Plan of Care Patient             Patient will benefit from skilled therapeutic intervention in order to improve the following deficits and impairments:  Abnormal gait, Decreased activity tolerance, Decreased balance, Decreased endurance, Decreased knowledge of use of DME, Decreased mobility, Decreased range of motion, Decreased skin integrity, Decreased strength, Increased edema, Postural dysfunction, Prosthetic Dependency, Pain, Impaired flexibility  Visit Diagnosis: Pain in right hip  Other abnormalities of gait and mobility  Unsteadiness on feet  Stiffness of right hip,  not elsewhere classified  Muscle weakness (generalized)     Problem List Patient Active Problem List   Diagnosis Date Noted   S/P BKA (below knee amputation) unilateral, left ([place]) 10/03/2020   Non-healing wound of lower extremity 10/03/2020   History of amputation of left foot through metatarsal bone ([place]) 09/11/2020   PAD (peripheral artery disease) ([place]) 08/20/2020   Status post total replacement of left hip 06/05/2020   Unilateral primary osteoarthritis, right hip 04/17/2020   Unilateral primary osteoarthritis, left hip 04/17/2020   Amputated toe, right ([place]) 11/22/2018   Type 2 diabetes mellitus with foot ulcer, without long-term current use of insulin ([place]) 11/22/2018   Mixed hyperlipidemia 11/22/2018   Aneurysm of left popliteal artery ([place]) 11/22/2018   Status post peripheral artery angioplasty with insertion of stent 11/22/2018   Osteomyelitis of third toe of right foot ([place])    Cellulitis of third toe of right foot     Jamey Reas, PT, DPT 09/21/2021, 9:51 AM  [facility] [address] [place], [place], [address] Phone: [phone]   Fax:  [phone]  Name: Anthony Park MRN: [id number] Date of Birth: Aug 24, 1961

## 2021-09-24 ENCOUNTER — Other Ambulatory Visit: Payer: Self-pay

## 2021-09-24 ENCOUNTER — Ambulatory Visit (INDEPENDENT_AMBULATORY_CARE_PROVIDER_SITE_OTHER): Payer: BC Managed Care – PPO | Admitting: Physical Therapy

## 2021-09-24 DIAGNOSIS — R2681 Unsteadiness on feet: Secondary | ICD-10-CM | POA: Diagnosis not present

## 2021-09-24 DIAGNOSIS — M25551 Pain in right hip: Secondary | ICD-10-CM | POA: Diagnosis not present

## 2021-09-24 DIAGNOSIS — R2689 Other abnormalities of gait and mobility: Secondary | ICD-10-CM | POA: Diagnosis not present

## 2021-09-24 DIAGNOSIS — M25651 Stiffness of right hip, not elsewhere classified: Secondary | ICD-10-CM | POA: Diagnosis not present

## 2021-09-24 DIAGNOSIS — M6281 Muscle weakness (generalized): Secondary | ICD-10-CM

## 2021-09-24 DIAGNOSIS — M25662 Stiffness of left knee, not elsewhere classified: Secondary | ICD-10-CM

## 2021-09-24 NOTE — Therapy (Signed)
[facility] Physical Therapy [address] [place], [place], [address] Phone: [phone]   Fax:  [phone]  Physical Therapy Treatment  Patient Details  Name: Anthony Park MRN: [id number] Date of Birth: [birth year] Referring Provider (PT): Carney Bern   Encounter Date: 09/24/2021   PT End of Session - 09/24/21 0916     Visit Number 10    Number of Visits 16    Date for PT Re-Evaluation 10/13/21    Authorization Type BCBS comm ppo    Authorization Time Period 2023 Deductible ($1,450.00)$0.00 Paid $1,450.00 to go, Out-of-Pocket Limit ($2,650.00)  $0.00 Paid $2,650.00 to go    Authorization - Visit Number 2    Authorization - Number of Visits 116    PT Start Time [phone]    PT Stop Time 0930    PT Time Calculation (min) 43 min    Activity Tolerance Patient tolerated treatment well    Behavior During Therapy [facility] for tasks assessed/performed             Past Medical History:  Diagnosis Date   Arthritis    Diabetes mellitus without complication ([place])    type 2 controlled with diet    Diabetic toe ulcer ([place]) 11/05/2018   History of kidney stones    passed 1 several years ago   Hyperlipidemia    Hypertension    not on medications   Peripheral vascular disease ([place])    stent in left leg due to anerysm     Past Surgical History:  Procedure Laterality Date    Left Transmetatarsal Ampuation (Left Foot)  08/20/2020   ABDOMINAL AORTOGRAM W/LOWER EXTREMITY N/A 08/06/2020   Procedure: ABDOMINAL AORTOGRAM W/LOWER EXTREMITY;  Surgeon: Marty Heck, MD;  Location: [place] [facility];  Service: Cardiovascular;  Laterality: N/A;   AMPUTATION Right 11/07/2018   Procedure: RIGHT 3RD TOE AMPUTATION;  Surgeon: Newt Minion, MD;  Location: [place];  Service: Orthopedics;  Laterality: Right;   AMPUTATION Left 08/20/2020   Procedure: Left Transmetatarsal Ampuation;  Surgeon: Marty Heck, MD;  Location: [place];  Service: Vascular;  Laterality: Left;    AMPUTATION Left 10/03/2020   Procedure: LEFT BELOW KNEE AMPUTATION;  Surgeon: Marty Heck, MD;  Location: [place];  Service: Vascular;  Laterality: Left;   AMPUTATION TOE Left    big toe and one next to it   CATARACT EXTRACTION Bilateral    EYE SURGERY Bilateral    cataracts removed   INSERTION OF ILIAC STENT Left    PERIPHERAL VASCULAR BALLOON ANGIOPLASTY Left 08/06/2020   Procedure: PERIPHERAL VASCULAR BALLOON ANGIOPLASTY;  Surgeon: Marty Heck, MD;  Location: [place] [facility];  Service: Cardiovascular;  Laterality: Left;  Peroneal artery.   TOE AMPUTATION Left 2015   TOTAL HIP ARTHROPLASTY Left 05/23/2020   Procedure: LEFT TOTAL HIP ARTHROPLASTY ANTERIOR APPROACH;  Surgeon: Mcarthur Rossetti, MD;  [facility];  Service: Orthopedics;  Laterality: Left;   TOTAL HIP ARTHROPLASTY Right 07/31/2021   Procedure: RIGHT TOTAL HIP ARTHROPLASTY ANTERIOR APPROACH;  Surgeon: Mcarthur Rossetti, MD;  [facility];  Service: Orthopedics;  Laterality: Right;   WOUND DEBRIDEMENT Left 09/11/2020   Procedure: DEBRIDEMENT OF LEFT TRANSMETATARSAL WOUND;  Surgeon: Cherre Robins, MD;  Location: [place];  Service: Vascular;  Laterality: Left;    There were no vitals filed for this visit.   Subjective Assessment - 09/24/21 0859     Subjective he had follow up with MD due to concerns from  residual limb and MD placed him on antibiotics and pt reports some puss was squeezed out by MD.    Pertinent History Left TTTA, left THA (05/23/20), arthritis, DM2, HTN, HLD, PVD    Patient Stated Goals Wants to use prosthesis to return to some work, get back in community, be mobile, work in gym in his garage    Pain Onset More than a month ago    Pain Onset More than a month ago                               [facility] Adult PT Treatment/Exercise - 09/24/21 0001       Ambulation/Gait   Ambulation/Gait Yes    Ambulation/Gait Assistance 5: Supervision     Ambulation/Gait Assistance Details pt limited by residual limb pain    Ambulation Distance (Feet) 50 Feet   X3   Assistive device Straight cane;Prosthesis      Neuro Re-ed    Neuro Re-ed Details  standing on foam tandem stance RLE in front 30 sec and RLE in back 30sec intermittent UE support      Knee/Hip Exercises: Stretches   Active Hamstring Stretch Right;3 reps;30 seconds    Active Hamstring Stretch Limitations supine with strap    Hip Flexor Stretch Right;3 reps;30 seconds    Hip Flexor Stretch Limitations supine Rt leg off EOB    ITB Stretch Right;3 reps;30 seconds    Other Knee/Hip Stretches Rt leg single knee to chest stretch 3x10 0sec      Knee/Hip Exercises: Machines for Strengthening   Total Gym Leg Press Shuttle leg press 150# DL 2X15 & RLE only 87# 15 reps 2 sets focus on maximal hip ext,      Knee/Hip Exercises: Supine   Single Leg Bridge Strengthening;Right;1 set;15 reps;Limitations    Straight Leg Raises Right;15 reps      Knee/Hip Exercises: Sidelying   Hip ABduction Right;15 reps    Clams RLE 20 reps with cues visually (target) tactile & verbally to decrease pelvic rotation creating movement.    Other Sidelying Knee/Hip Exercises reverse clam on Rt X20 reps                       PT Short Term Goals - 09/16/21 0937       PT SHORT TERM GOAL #1   Title He will be I and compliant with HEP    Time 4    Period Weeks    Status Achieved    Target Date 09/15/21      PT SHORT TERM GOAL #2   Title He will be able to ambulate 200 feet with SPC mod I to supervision    Baseline met 1/4    Time 4    Period Weeks    Status Achieved    Target Date 09/15/21      PT SHORT TERM GOAL #3   Title -      PT SHORT TERM GOAL #4   Title -               PT Long Term Goals - 09/09/21 0854       PT LONG TERM GOAL #1   Title He will improve FOTO functional score to 59%    Time 8    Period Weeks    Status On-going    Target Date 10/13/21      PT  LONG TERM  GOAL #2   Title Patient will improve Rt hip strength to 5/5 MMT all planes for functional activity    Baseline 4/5    Time 8    Period Weeks    Status On-going    Target Date 10/13/21      PT LONG TERM GOAL #3   Title Berg Balance >/= 43/56 to indicate lower fall risk    Baseline Berg 39/56    Time 8    Period Weeks    Status On-going      PT LONG TERM GOAL #4   Title Patient ambulates 300' with SPC to no AD    Baseline can do this with rollator    Time 8    Period Weeks    Status On-going      PT LONG TERM GOAL #5   Title Patient will improve Rt hip pain to overall less than 3/10 with ususal activity    Baseline 4 today, can still get up to 10    Time 8    Period Weeks    Status On-going    Target Date 10/13/21                   Plan - 09/24/21 0935     Clinical Impression Statement His pain and possible infection limited his standing and gait activity again. He is now on antibiotics for next 2 weeks. We did focus more on supine stretching and strengthening for his Rt hip due to standing limitations.    Personal Factors and Comorbidities Comorbidity 3+;Fitness;Time since onset of injury/illness/exacerbation;Past/Current Experience    Comorbidities RT THA, Left TTA, left THA (05/23/20), arthritis, DM2, HTN, HLD, PVD    Examination-Activity Limitations Locomotion Level;Squat;Stairs;Stand;Transfers;Lift;Sleep    Examination-Participation Restrictions Community Activity;Occupation;Driving;Shop    Stability/Clinical Decision Making Evolving/Moderate complexity    Rehab Potential Good    PT Frequency 2x / week    PT Duration 8 weeks    PT Treatment/Interventions ADLs/Self Care Home Management;DME Instruction;Gait training;Stair training;Functional mobility training;Therapeutic activities;Therapeutic exercise;Balance training;Neuromuscular re-education;Patient/family education;Prosthetic Training;Vestibular;Dry needling;Manual techniques;Electrical  Stimulation;Cryotherapy;Ultrasound;Passive range of motion;Joint Manipulations    PT Next Visit Plan may need to continue to limit standing activity until infection/pain resides in his residual limb. Check to see if MD filled out his disability paperwork. cont to work on Rt hip ROM and strengthening progress to standing as tolerated, gait and balance    PT Home Exercise Plan squats at counter top with UE support and chair behind him, standing hip flexion and abd with red band, standing hip flexor stretch    Consulted and Agree with Plan of Care Patient             Patient will benefit from skilled therapeutic intervention in order to improve the following deficits and impairments:  Abnormal gait, Decreased activity tolerance, Decreased balance, Decreased endurance, Decreased knowledge of use of DME, Decreased mobility, Decreased range of motion, Decreased skin integrity, Decreased strength, Increased edema, Postural dysfunction, Prosthetic Dependency, Pain, Impaired flexibility  Visit Diagnosis: Pain in right hip  Other abnormalities of gait and mobility  Unsteadiness on feet  Stiffness of right hip, not elsewhere classified  Muscle weakness (generalized)  Stiffness of left knee, not elsewhere classified     Problem List Patient Active Problem List   Diagnosis Date Noted   S/P BKA (below knee amputation) unilateral, left ([place]) 10/03/2020   Non-healing wound of lower extremity 10/03/2020   History of amputation of left foot through metatarsal bone ([place]) 09/11/2020  PAD (peripheral artery disease) ([place]) 08/20/2020   Status post total replacement of left hip 06/05/2020   Unilateral primary osteoarthritis, right hip 04/17/2020   Unilateral primary osteoarthritis, left hip 04/17/2020   Amputated toe, right ([place]) 11/22/2018   Type 2 diabetes mellitus with foot ulcer, without long-term current use of insulin ([place]) 11/22/2018   Mixed hyperlipidemia 11/22/2018   Aneurysm of left  popliteal artery ([place]) 11/22/2018   Status post peripheral artery angioplasty with insertion of stent 11/22/2018   Osteomyelitis of third toe of right foot ([place])    Cellulitis of third toe of right foot     Debbe Odea, PT,DPT 09/24/2021, 9:38 AM  [facility] [address] [place], [place], [address] Phone: [phone]   Fax:  [phone]  Name: Anthony Park MRN: [id number] Date of Birth: 06-Jul-1961

## 2021-09-28 ENCOUNTER — Other Ambulatory Visit: Payer: Self-pay

## 2021-09-28 ENCOUNTER — Ambulatory Visit (INDEPENDENT_AMBULATORY_CARE_PROVIDER_SITE_OTHER): Payer: BC Managed Care – PPO | Admitting: Physical Therapy

## 2021-09-28 DIAGNOSIS — R2689 Other abnormalities of gait and mobility: Secondary | ICD-10-CM | POA: Diagnosis not present

## 2021-09-28 DIAGNOSIS — M25551 Pain in right hip: Secondary | ICD-10-CM | POA: Diagnosis not present

## 2021-09-28 DIAGNOSIS — R2681 Unsteadiness on feet: Secondary | ICD-10-CM

## 2021-09-28 DIAGNOSIS — M25651 Stiffness of right hip, not elsewhere classified: Secondary | ICD-10-CM | POA: Diagnosis not present

## 2021-09-28 NOTE — Therapy (Signed)
[facility] [address] [place], [place], [address] Phone: [phone]   Fax:  [phone]  Physical Therapy Treatment  Patient Details  Name: Anthony Park MRN: [id number] Date of Birth: June 30, 1961 Referring Provider (PT): Carney Bern   Encounter Date: 09/28/2021   PT End of Session - 09/28/21 0910     Visit Number 11    Number of Visits 16    Date for PT Re-Evaluation 10/13/21    Authorization Type BCBS comm ppo    Authorization Time Period 2023 Deductible ($1,450.00)$0.00 Paid $1,450.00 to go, Out-of-Pocket Limit ($2,650.00)  $0.00 Paid $2,650.00 to go    Authorization - Visit Number 3    Authorization - Number of Visits 116    PT Start Time [phone]    PT Stop Time 0930    PT Time Calculation (min) 44 min    Activity Tolerance Patient tolerated treatment well;Patient limited by pain    Behavior During Therapy [facility] for tasks assessed/performed             Past Medical History:  Diagnosis Date   Arthritis    Diabetes mellitus without complication ([place])    type 2 controlled with diet    Diabetic toe ulcer ([place]) 11/05/2018   History of kidney stones    passed 1 several years ago   Hyperlipidemia    Hypertension    not on medications   Peripheral vascular disease ([place])    stent in left leg due to anerysm     Past Surgical History:  Procedure Laterality Date    Left Transmetatarsal Ampuation (Left Foot)  08/20/2020   ABDOMINAL AORTOGRAM W/LOWER EXTREMITY N/A 08/06/2020   Procedure: ABDOMINAL AORTOGRAM W/LOWER EXTREMITY;  Surgeon: Marty Heck, MD;  Location: [place] [facility];  Service: Cardiovascular;  Laterality: N/A;   AMPUTATION Right 11/07/2018   Procedure: RIGHT 3RD TOE AMPUTATION;  Surgeon: Newt Minion, MD;  Location: [place];  Service: Orthopedics;  Laterality: Right;   AMPUTATION Left 08/20/2020   Procedure: Left Transmetatarsal Ampuation;  Surgeon: Marty Heck, MD;  Location: [place];  Service:  Vascular;  Laterality: Left;   AMPUTATION Left 10/03/2020   Procedure: LEFT BELOW KNEE AMPUTATION;  Surgeon: Marty Heck, MD;  Location: [place];  Service: Vascular;  Laterality: Left;   AMPUTATION TOE Left    big toe and one next to it   CATARACT EXTRACTION Bilateral    EYE SURGERY Bilateral    cataracts removed   INSERTION OF ILIAC STENT Left    PERIPHERAL VASCULAR BALLOON ANGIOPLASTY Left 08/06/2020   Procedure: PERIPHERAL VASCULAR BALLOON ANGIOPLASTY;  Surgeon: Marty Heck, MD;  Location: [place] [facility];  Service: Cardiovascular;  Laterality: Left;  Peroneal artery.   TOE AMPUTATION Left 2015   TOTAL HIP ARTHROPLASTY Left 05/23/2020   Procedure: LEFT TOTAL HIP ARTHROPLASTY ANTERIOR APPROACH;  Surgeon: Mcarthur Rossetti, MD;  [facility];  Service: Orthopedics;  Laterality: Left;   TOTAL HIP ARTHROPLASTY Right 07/31/2021   Procedure: RIGHT TOTAL HIP ARTHROPLASTY ANTERIOR APPROACH;  Surgeon: Mcarthur Rossetti, MD;  [facility];  Service: Orthopedics;  Laterality: Right;   WOUND DEBRIDEMENT Left 09/11/2020   Procedure: DEBRIDEMENT OF LEFT TRANSMETATARSAL WOUND;  Surgeon: Cherre Robins, MD;  Location: [place];  Service: Vascular;  Laterality: Left;    There were no vitals filed for this visit.   Subjective Assessment - 09/28/21 0906     Subjective He relays his Lt residual limb feels  overall better today. He says his Rt hip does not have pain but is very tight still.    Pertinent History Left TTTA, left THA (05/23/20), arthritis, DM2, HTN, HLD, PVD    Patient Stated Goals Wants to use prosthesis to return to some work, get back in community, be mobile, work in gym in his garage    Pain Onset More than a month ago    Pain Onset More than a month ago                [facility] Adult PT Treatment/Exercise - 09/28/21 0001       Ambulation/Gait   Ambulation/Gait Yes    Ambulation/Gait Assistance 5: Supervision    Ambulation/Gait Assistance  Details pt limited by residual limb pain    Ambulation Distance (Feet) 75 Feet   X2   Assistive device Straight cane;Prosthesis      Neuro Re-ed    Neuro Re-ed Details  standing on foam tandem stance RLE in front 30 sec and RLE in back 30sec intermittent UE support      Knee/Hip Exercises: Stretches   Hip Flexor Stretch Right;3 reps;30 seconds    Hip Flexor Stretch Limitations supine Rt leg off EOB    ITB Stretch Right;3 reps;30 seconds    Other Knee/Hip Stretches Rt leg single knee to chest stretch 5 X10 sec      Knee/Hip Exercises: Machines for Strengthening   Total Gym Leg Press Shuttle leg press 156# DL 2X15 & RLE only 87# 15 reps 2 sets focus on maximal hip ext,      Knee/Hip Exercises: Supine   Single Leg Bridge Strengthening;Right;1 set;15 reps;Limitations    Straight Leg Raises Right;15 reps      Knee/Hip Exercises: Sidelying   Hip ABduction Right;15 reps    Clams RLE 20 reps with cues visually (target) tactile & verbally to decrease pelvic rotation creating movement.                       PT Short Term Goals - 09/16/21 0937       PT SHORT TERM GOAL #1   Title He will be I and compliant with HEP    Time 4    Period Weeks    Status Achieved    Target Date 09/15/21      PT SHORT TERM GOAL #2   Title He will be able to ambulate 200 feet with SPC mod I to supervision    Baseline met 1/4    Time 4    Period Weeks    Status Achieved    Target Date 09/15/21      PT SHORT TERM GOAL #3   Title -      PT SHORT TERM GOAL #4   Title -               PT Long Term Goals - 09/09/21 0854       PT LONG TERM GOAL #1   Title He will improve FOTO functional score to 59%    Time 8    Period Weeks    Status On-going    Target Date 10/13/21      PT LONG TERM GOAL #2   Title Patient will improve Rt hip strength to 5/5 MMT all planes for functional activity    Baseline 4/5    Time 8    Period Weeks    Status On-going    Target Date 10/13/21  PT LONG TERM GOAL #3   Title Berg Balance >/= 43/56 to indicate lower fall risk    Baseline Berg 39/56    Time 8    Period Weeks    Status On-going      PT LONG TERM GOAL #4   Title Patient ambulates 300' with SPC to no AD    Baseline can do this with rollator    Time 8    Period Weeks    Status On-going      PT LONG TERM GOAL #5   Title Patient will improve Rt hip pain to overall less than 3/10 with ususal activity    Baseline 4 today, can still get up to 10    Time 8    Period Weeks    Status On-going    Target Date 10/13/21                   Plan - 09/28/21 0915     Clinical Impression Statement We continued to work on Rt hip ROM and strengthening to his tolerance. He had improved overall tolerance to standing and walking today using SPC but this is still overall limited by Lt residual limb pain with this. We will work to progress this slowly as tolerated.    Personal Factors and Comorbidities Comorbidity 3+;Fitness;Time since onset of injury/illness/exacerbation;Past/Current Experience    Comorbidities RT THA, Left TTA, left THA (05/23/20), arthritis, DM2, HTN, HLD, PVD    Examination-Activity Limitations Locomotion Level;Squat;Stairs;Stand;Transfers;Lift;Sleep    Examination-Participation Restrictions Community Activity;Occupation;Driving;Shop    Stability/Clinical Decision Making Evolving/Moderate complexity    Rehab Potential Good    PT Frequency 2x / week    PT Duration 8 weeks    PT Treatment/Interventions ADLs/Self Care Home Management;DME Instruction;Gait training;Stair training;Functional mobility training;Therapeutic activities;Therapeutic exercise;Balance training;Neuromuscular re-education;Patient/family education;Prosthetic Training;Vestibular;Dry needling;Manual techniques;Electrical Stimulation;Cryotherapy;Ultrasound;Passive range of motion;Joint Manipulations    PT Next Visit Plan may need to continue to limit standing activity until infection/pain  resides in his residual limb. Check to see if MD filled out his disability paperwork. cont to work on Rt hip ROM and strengthening progress to standing as tolerated, gait and balance    PT Home Exercise Plan squats at counter top with UE support and chair behind him, standing hip flexion and abd with red band, standing hip flexor stretch    Consulted and Agree with Plan of Care Patient             Patient will benefit from skilled therapeutic intervention in order to improve the following deficits and impairments:  Abnormal gait, Decreased activity tolerance, Decreased balance, Decreased endurance, Decreased knowledge of use of DME, Decreased mobility, Decreased range of motion, Decreased skin integrity, Decreased strength, Increased edema, Postural dysfunction, Prosthetic Dependency, Pain, Impaired flexibility  Visit Diagnosis: Pain in right hip  Other abnormalities of gait and mobility  Unsteadiness on feet  Stiffness of right hip, not elsewhere classified     Problem List Patient Active Problem List   Diagnosis Date Noted   S/P BKA (below knee amputation) unilateral, left ([place]) 10/03/2020   Non-healing wound of lower extremity 10/03/2020   History of amputation of left foot through metatarsal bone ([place]) 09/11/2020   PAD (peripheral artery disease) ([place]) 08/20/2020   Status post total replacement of left hip 06/05/2020   Unilateral primary osteoarthritis, right hip 04/17/2020   Unilateral primary osteoarthritis, left hip 04/17/2020   Amputated toe, right ([place]) 11/22/2018   Type 2 diabetes mellitus with foot ulcer, without long-term current  use of insulin ([place]) 11/22/2018   Mixed hyperlipidemia 11/22/2018   Aneurysm of left popliteal artery ([place]) 11/22/2018   Status post peripheral artery angioplasty with insertion of stent 11/22/2018   Osteomyelitis of third toe of right foot ([place])    Cellulitis of third toe of right foot     Debbe Odea, PT,DPT 09/28/2021, 9:34  AM  [facility] [address] [place], [place], [address] Phone: [phone]   Fax:  [phone]  Name: Anthony Park MRN: [id number] Date of Birth: [birth year]

## 2021-09-30 ENCOUNTER — Ambulatory Visit (INDEPENDENT_AMBULATORY_CARE_PROVIDER_SITE_OTHER): Payer: BC Managed Care – PPO | Admitting: Physical Therapy

## 2021-09-30 ENCOUNTER — Encounter: Payer: Self-pay | Admitting: Physical Therapy

## 2021-09-30 ENCOUNTER — Other Ambulatory Visit: Payer: Self-pay

## 2021-09-30 DIAGNOSIS — M25651 Stiffness of right hip, not elsewhere classified: Secondary | ICD-10-CM

## 2021-09-30 DIAGNOSIS — M25662 Stiffness of left knee, not elsewhere classified: Secondary | ICD-10-CM

## 2021-09-30 DIAGNOSIS — R2681 Unsteadiness on feet: Secondary | ICD-10-CM

## 2021-09-30 DIAGNOSIS — M25551 Pain in right hip: Secondary | ICD-10-CM

## 2021-09-30 DIAGNOSIS — R2689 Other abnormalities of gait and mobility: Secondary | ICD-10-CM | POA: Diagnosis not present

## 2021-09-30 DIAGNOSIS — M6281 Muscle weakness (generalized): Secondary | ICD-10-CM

## 2021-09-30 NOTE — Therapy (Signed)
[facility] [address] [place], [place], [address] Phone: [phone]   Fax:  [phone]  Physical Therapy Treatment  Patient Details  Name: Anthony Park MRN: [id number] Date of Birth: 1961/03/01 Referring Provider (PT): Carney Bern   Encounter Date: 09/30/2021   PT End of Session - 09/30/21 0852     Visit Number 12    Number of Visits 16    Date for PT Re-Evaluation 10/13/21    Authorization Type BCBS comm ppo    Authorization Time Period 2023 Deductible ($1,450.00)$0.00 Paid $1,450.00 to go, Out-of-Pocket Limit ($2,650.00)  $0.00 Paid $2,650.00 to go    Authorization - Visit Number 4    Authorization - Number of Visits 116    PT Start Time 0845    PT Stop Time 0930    PT Time Calculation (min) 45 min    Activity Tolerance Patient tolerated treatment well    Behavior During Therapy [facility] for tasks assessed/performed             Past Medical History:  Diagnosis Date   Arthritis    Diabetes mellitus without complication ([place])    type 2 controlled with diet    Diabetic toe ulcer ([place]) 11/05/2018   History of kidney stones    passed 1 several years ago   Hyperlipidemia    Hypertension    not on medications   Peripheral vascular disease ([place])    stent in left leg due to anerysm     Past Surgical History:  Procedure Laterality Date    Left Transmetatarsal Ampuation (Left Foot)  08/20/2020   ABDOMINAL AORTOGRAM W/LOWER EXTREMITY N/A 08/06/2020   Procedure: ABDOMINAL AORTOGRAM W/LOWER EXTREMITY;  Surgeon: Marty Heck, MD;  Location: [place] [facility];  Service: Cardiovascular;  Laterality: N/A;   AMPUTATION Right 11/07/2018   Procedure: RIGHT 3RD TOE AMPUTATION;  Surgeon: Newt Minion, MD;  Location: [place];  Service: Orthopedics;  Laterality: Right;   AMPUTATION Left 08/20/2020   Procedure: Left Transmetatarsal Ampuation;  Surgeon: Marty Heck, MD;  Location: [place];  Service: Vascular;  Laterality: Left;    AMPUTATION Left 10/03/2020   Procedure: LEFT BELOW KNEE AMPUTATION;  Surgeon: Marty Heck, MD;  Location: [place];  Service: Vascular;  Laterality: Left;   AMPUTATION TOE Left    big toe and one next to it   CATARACT EXTRACTION Bilateral    EYE SURGERY Bilateral    cataracts removed   INSERTION OF ILIAC STENT Left    PERIPHERAL VASCULAR BALLOON ANGIOPLASTY Left 08/06/2020   Procedure: PERIPHERAL VASCULAR BALLOON ANGIOPLASTY;  Surgeon: Marty Heck, MD;  Location: [place] [facility];  Service: Cardiovascular;  Laterality: Left;  Peroneal artery.   TOE AMPUTATION Left 2015   TOTAL HIP ARTHROPLASTY Left 05/23/2020   Procedure: LEFT TOTAL HIP ARTHROPLASTY ANTERIOR APPROACH;  Surgeon: Mcarthur Rossetti, MD;  [facility];  Service: Orthopedics;  Laterality: Left;   TOTAL HIP ARTHROPLASTY Right 07/31/2021   Procedure: RIGHT TOTAL HIP ARTHROPLASTY ANTERIOR APPROACH;  Surgeon: Mcarthur Rossetti, MD;  [facility];  Service: Orthopedics;  Laterality: Right;   WOUND DEBRIDEMENT Left 09/11/2020   Procedure: DEBRIDEMENT OF LEFT TRANSMETATARSAL WOUND;  Surgeon: Cherre Robins, MD;  Location: [place];  Service: Vascular;  Laterality: Left;    There were no vitals filed for this visit.   Subjective Assessment - 09/30/21 0844     Subjective He went to gym yesterday doing bike and weight machines  for upper & lower body. His goal is 3x/week    Pertinent History Left TTTA, left THA (05/23/20), arthritis, DM2, HTN, HLD, PVD    Patient Stated Goals Wants to use prosthesis to return to some work, get back in community, be mobile, work in gym in his garage    Currently in Pain? Yes    Pain Score 1     Pain Location Hip    Pain Orientation Right    Pain Descriptors / Indicators Tightness    Pain Type Chronic pain    Pain Onset More than a month ago    Pain Frequency Intermittent    Aggravating Factors  activity level    Pain Relieving Factors rest    Pain Onset More than  a month ago                               [facility] Adult PT Treatment/Exercise - 09/30/21 0845       Ambulation/Gait   Ambulation/Gait Yes    Ambulation/Gait Assistance 5: Supervision    Ambulation Distance (Feet) 75 Feet   34' X2 with cane, 30' X 2 without device   Assistive device Straight cane;Prosthesis;None      Neuro Re-ed    Neuro Re-ed Details  --      Knee/Hip Exercises: Stretches   Active Hamstring Stretch 3 reps;30 seconds;Both    Active Hamstring Stretch Limitations supine with strap    Hip Flexor Stretch Right;3 reps;30 seconds    Hip Flexor Stretch Limitations supine Rt leg off EOB    ITB Stretch Right;3 reps;30 seconds    ITB Stretch Limitations supine with strap RLE into adduction    Other Knee/Hip Stretches Rt leg single knee to chest stretch 2 X 30 sec      Knee/Hip Exercises: Machines for Strengthening   Total Gym Leg Press Shuttle leg press 162# DL 2X15 & RLE only 87# 15 reps 2 sets focus on maximal hip ext,      Knee/Hip Exercises: Seated   Sit to Sand 1 set;10 reps;without UE support      Knee/Hip Exercises: Supine   Bridges Both;2 sets;15 reps    Bridges Limitations BLE lift & lower and raising LLE off mat for 3 sec at peak bridge    Single Leg Bridge --    Straight Leg Raises Right;10 reps;2 sets      Knee/Hip Exercises: Sidelying   Hip ABduction Right;15 reps    Clams RLE 20 reps red theraband with cues visually (target) tactile & verbally to decrease pelvic rotation creating movement.      Prosthetics   Prosthetic Care Comments  pt reports no residual limb pain today.                       PT Short Term Goals - 09/16/21 0937       PT SHORT TERM GOAL #1   Title He will be I and compliant with HEP    Time 4    Period Weeks    Status Achieved    Target Date 09/15/21      PT SHORT TERM GOAL #2   Title He will be able to ambulate 200 feet with SPC mod I to supervision    Baseline met 1/4    Time 4    Period  Weeks    Status Achieved    Target Date 09/15/21  PT SHORT TERM GOAL #3   Title -      PT SHORT TERM GOAL #4   Title -               PT Long Term Goals - 09/09/21 0854       PT LONG TERM GOAL #1   Title He will improve FOTO functional score to 59%    Time 8    Period Weeks    Status On-going    Target Date 10/13/21      PT LONG TERM GOAL #2   Title Patient will improve Rt hip strength to 5/5 MMT all planes for functional activity    Baseline 4/5    Time 8    Period Weeks    Status On-going    Target Date 10/13/21      PT LONG TERM GOAL #3   Title Berg Balance >/= 43/56 to indicate lower fall risk    Baseline Berg 39/56    Time 8    Period Weeks    Status On-going      PT LONG TERM GOAL #4   Title Patient ambulates 300' with SPC to no AD    Baseline can do this with rollator    Time 8    Period Weeks    Status On-going      PT LONG TERM GOAL #5   Title Patient will improve Rt hip pain to overall less than 3/10 with ususal activity    Baseline 4 today, can still get up to 10    Time 8    Period Weeks    Status On-going    Target Date 10/13/21                   Plan - 09/30/21 5697     Clinical Impression Statement Patient reporting no residual limb pain with prosthesis today which improved his gait & standing activities.  Patient's right hip strength & mobility continues to improve with skilled PT activities.  He appears on target to meet LTGs in 2 weeks.    Personal Factors and Comorbidities Comorbidity 3+;Fitness;Time since onset of injury/illness/exacerbation;Past/Current Experience    Comorbidities RT THA, Left TTA, left THA (05/23/20), arthritis, DM2, HTN, HLD, PVD    Examination-Activity Limitations Locomotion Level;Squat;Stairs;Stand;Transfers;Lift;Sleep    Examination-Participation Restrictions Community Activity;Occupation;Driving;Shop    Stability/Clinical Decision Making Evolving/Moderate complexity    Rehab Potential Good    PT  Frequency 2x / week    PT Duration 8 weeks    PT Treatment/Interventions ADLs/Self Care Home Management;DME Instruction;Gait training;Stair training;Functional mobility training;Therapeutic activities;Therapeutic exercise;Balance training;Neuromuscular re-education;Patient/family education;Prosthetic Training;Vestibular;Dry needling;Manual techniques;Electrical Stimulation;Cryotherapy;Ultrasound;Passive range of motion;Joint Manipulations    PT Next Visit Plan therapeutic exercise, gait & balance activities to progress mobility & safety.    PT Home Exercise Plan squats at counter top with UE support and chair behind him, standing hip flexion and abd with red band, standing hip flexor stretch    Consulted and Agree with Plan of Care Patient             Patient will benefit from skilled therapeutic intervention in order to improve the following deficits and impairments:  Abnormal gait, Decreased activity tolerance, Decreased balance, Decreased endurance, Decreased knowledge of use of DME, Decreased mobility, Decreased range of motion, Decreased skin integrity, Decreased strength, Increased edema, Postural dysfunction, Prosthetic Dependency, Pain, Impaired flexibility  Visit Diagnosis: Pain in right hip  Other abnormalities of gait and mobility  Unsteadiness on feet  Stiffness of right hip, not elsewhere classified  Muscle weakness (generalized)  Stiffness of left knee, not elsewhere classified     Problem List Patient Active Problem List   Diagnosis Date Noted   S/P BKA (below knee amputation) unilateral, left ([place]) 10/03/2020   Non-healing wound of lower extremity 10/03/2020   History of amputation of left foot through metatarsal bone ([place]) 09/11/2020   PAD (peripheral artery disease) ([place]) 08/20/2020   Status post total replacement of left hip 06/05/2020   Unilateral primary osteoarthritis, right hip 04/17/2020   Unilateral primary osteoarthritis, left hip 04/17/2020    Amputated toe, right ([place]) 11/22/2018   Type 2 diabetes mellitus with foot ulcer, without long-term current use of insulin ([place]) 11/22/2018   Mixed hyperlipidemia 11/22/2018   Aneurysm of left popliteal artery ([place]) 11/22/2018   Status post peripheral artery angioplasty with insertion of stent 11/22/2018   Osteomyelitis of third toe of right foot ([place])    Cellulitis of third toe of right foot     Jamey Reas, PT, DPT 09/30/2021, 12:11 PM  [facility] [address] [place], [place], [address] Phone: [phone]   Fax:  [phone]  Name: Anthony Park MRN: [id number] Date of Birth: September 16, 1960

## 2021-10-05 ENCOUNTER — Encounter: Payer: BC Managed Care – PPO | Admitting: Physical Therapy

## 2021-10-07 ENCOUNTER — Encounter: Payer: BC Managed Care – PPO | Admitting: Physical Therapy

## 2021-10-11 IMAGING — CT CT CHEST LUNG CANCER SCREENING LOW DOSE W/O CM
1 of 2 series · 10 of 20 positions shown, 13 images · non-contrast
Comparison: 10/03/2020 chest radiograph.

CLINICAL DATA: Forty pack-year smoking history, quitting 6 months
ago.

EXAM:
CT CHEST WITHOUT CONTRAST LOW-DOSE FOR LUNG CANCER SCREENING
TECHNIQUE: Multidetector CT imaging of the chest was performed following the
standard protocol without IV contrast.

[ct lung segmentation data · axial · 0.90mm/px · z∈[-400,-400]mm · 10 of 376 frames shown]
[frame 1/376  mediastinal]
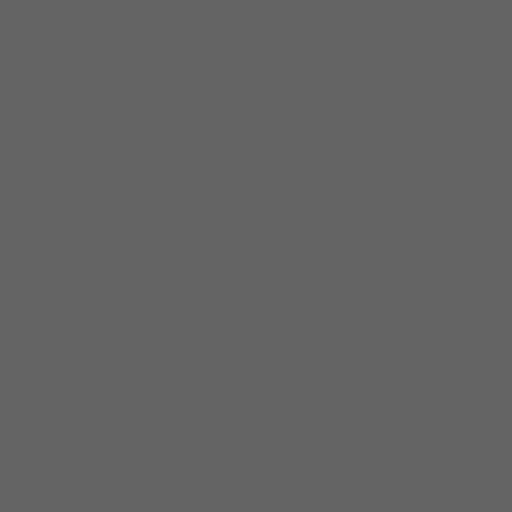
[frame 1/376  lung]
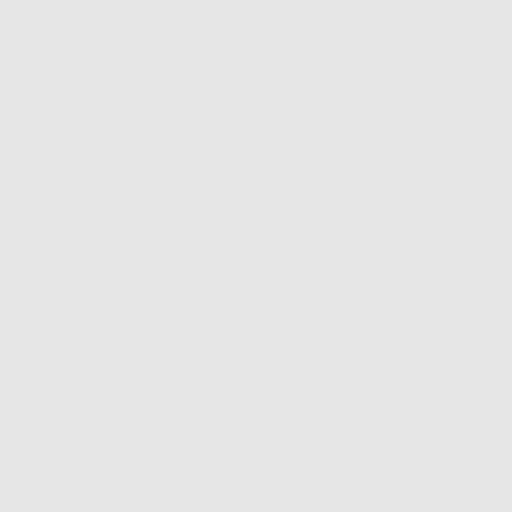
[frame 42/376  lung]
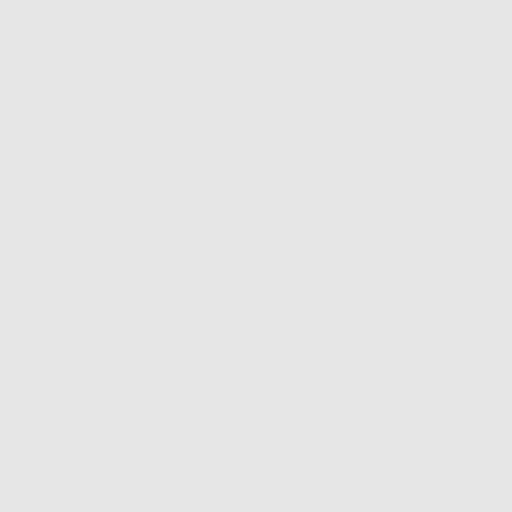
[frame 84/376  lung]
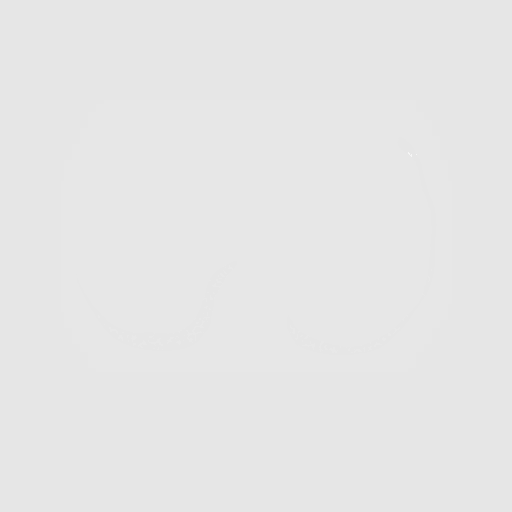
[frame 126/376  lung]
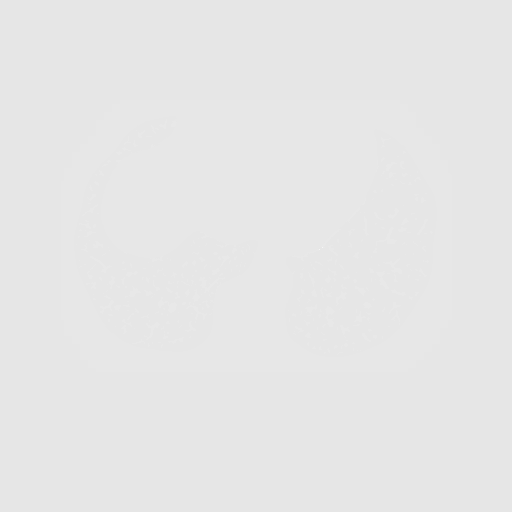
[frame 167/376  mediastinal]
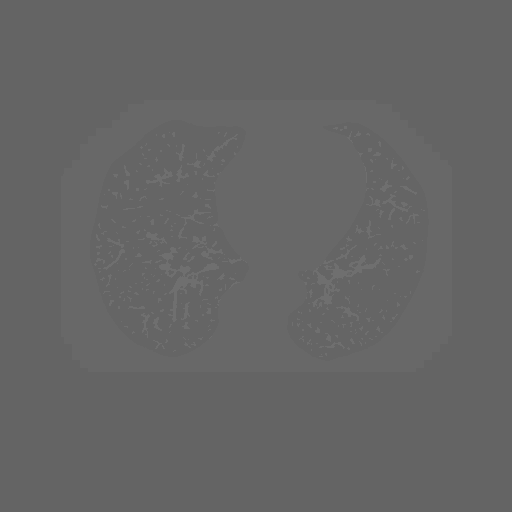
[frame 167/376  lung]
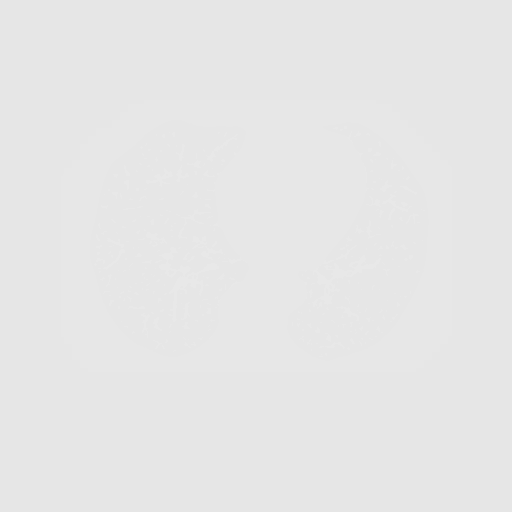
[frame 209/376  lung]
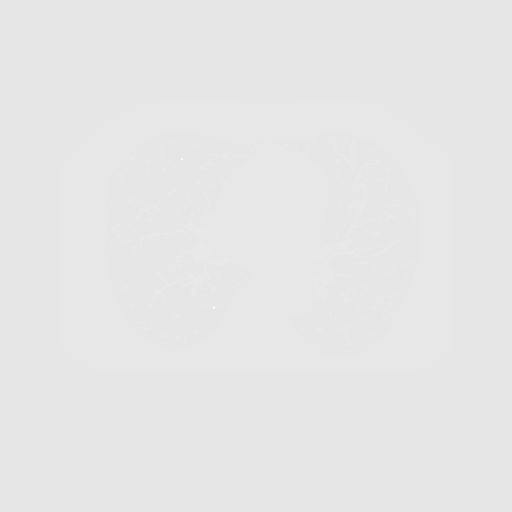
[frame 251/376  lung]
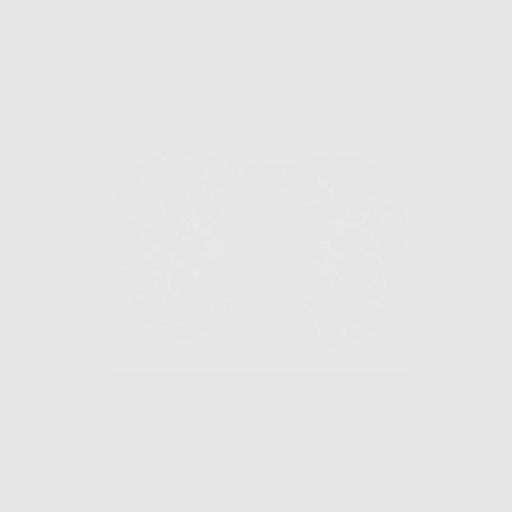
[frame 292/376  lung]
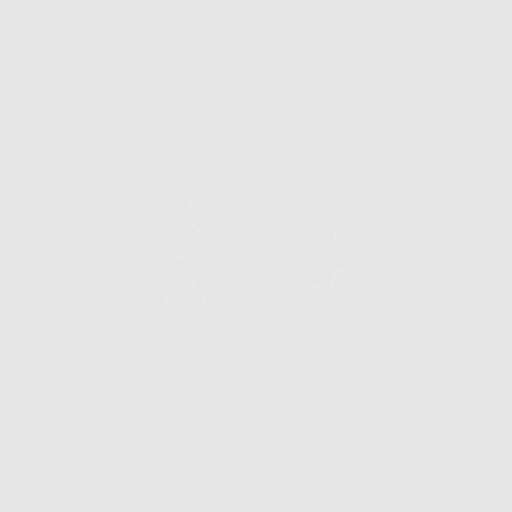
[frame 334/376  mediastinal]
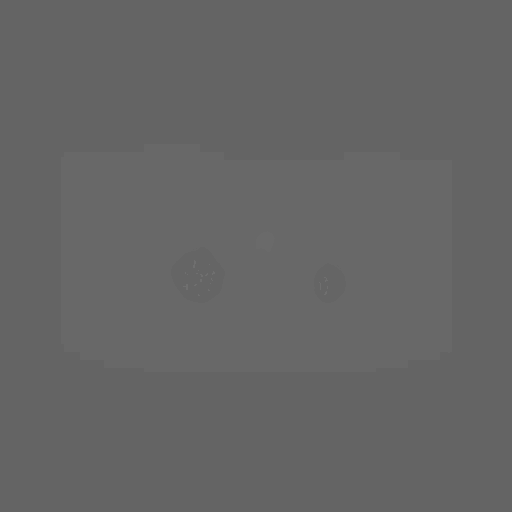
[frame 334/376  lung]
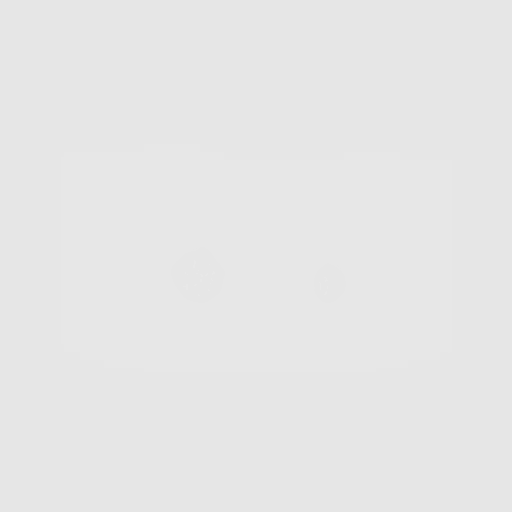
[frame 376/376  lung]
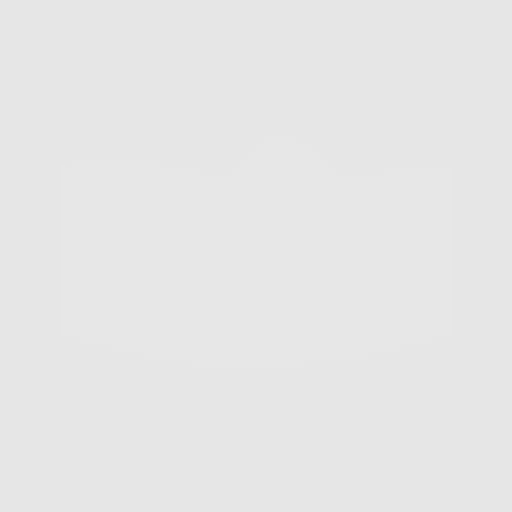

[10 of 20 positions shown; findings below may reference images not displayed]

FINDINGS: Cardiovascular: Advanced aortic and branch vessel atherosclerosis.
Bovine arch. Tortuous thoracic aorta. Normal heart size, without
pericardial effusion. Three vessel coronary artery calcification.
Advanced aortic valve calcification.

Mediastinum/Nodes: No mediastinal or definite hilar adenopathy,
given limitations of unenhanced CT.

Lungs/Pleura: No pleural fluid. Mild centrilobular emphysema. No
suspicious pulmonary nodule or mass.

Upper Abdomen: Normal imaged portions of the liver, spleen, stomach,
pancreas, gallbladder, adrenal glands, left kidney. Mild right-sided
hydroureteronephrosis is incompletely imaged including on 76/2.

Musculoskeletal: No acute osseous abnormality.
IMPRESSION: 1. Lung-RADS 1S, negative. Continue annual screening with low-dose
chest CT without contrast in 12 months.
2. The "S" modifier represents a potentially clinically significant
non pulmonary finding. Mild right-sided hydroureteronephrosis,
incompletely imaged. Consider dedicated abdominopelvic imaging
including renal ultrasound versus stone study CT.
3. Aortic Atherosclerosis (XODZP-J9T.T) and Emphysema (XODZP-HC2.H).
4. Age advanced coronary artery atherosclerosis. Recommend
assessment of coronary risk factors and consideration of medical
therapy.
5. Aortic valvular calcifications. Consider echocardiography to
evaluate for valvular dysfunction.

These results will be called to the ordering clinician or
representative by the Radiologist Assistant, and communication
documented in the PACS or [REDACTED].

## 2021-10-12 ENCOUNTER — Encounter: Payer: Self-pay | Admitting: Physical Therapy

## 2021-10-12 ENCOUNTER — Ambulatory Visit (INDEPENDENT_AMBULATORY_CARE_PROVIDER_SITE_OTHER): Payer: BC Managed Care – PPO | Admitting: Physical Therapy

## 2021-10-12 ENCOUNTER — Other Ambulatory Visit: Payer: Self-pay

## 2021-10-12 DIAGNOSIS — R2681 Unsteadiness on feet: Secondary | ICD-10-CM | POA: Diagnosis not present

## 2021-10-12 DIAGNOSIS — R2689 Other abnormalities of gait and mobility: Secondary | ICD-10-CM

## 2021-10-12 DIAGNOSIS — M25651 Stiffness of right hip, not elsewhere classified: Secondary | ICD-10-CM | POA: Diagnosis not present

## 2021-10-12 DIAGNOSIS — Z125 Encounter for screening for malignant neoplasm of prostate: Secondary | ICD-10-CM | POA: Diagnosis not present

## 2021-10-12 DIAGNOSIS — M25551 Pain in right hip: Secondary | ICD-10-CM

## 2021-10-12 DIAGNOSIS — E1142 Type 2 diabetes mellitus with diabetic polyneuropathy: Secondary | ICD-10-CM | POA: Diagnosis not present

## 2021-10-12 DIAGNOSIS — M6281 Muscle weakness (generalized): Secondary | ICD-10-CM

## 2021-10-12 NOTE — Therapy (Signed)
[facility] Physical Therapy [address] [place], [place], [address] Phone: [phone]   Fax:  [phone]  Physical Therapy Treatment  Patient Details  Name: Anthony Park MRN: [id number] Date of Birth: 1961/08/02 Referring Provider (PT): Carney Bern   Encounter Date: 10/12/2021   PT End of Session - 10/12/21 0953     Visit Number 13    Number of Visits 16    Date for PT Re-Evaluation 10/13/21    Authorization Type BCBS comm ppo    Authorization Time Period 2023 Deductible ($1,450.00)$0.00 Paid $1,450.00 to go, Out-of-Pocket Limit ($2,650.00)  $0.00 Paid $2,650.00 to go    Authorization - Visit Number 5    Authorization - Number of Visits 116    PT Start Time 0845    PT Stop Time 0924    PT Time Calculation (min) 39 min    Activity Tolerance Patient tolerated treatment well    Behavior During Therapy [facility] for tasks assessed/performed             Past Medical History:  Diagnosis Date   Arthritis    Diabetes mellitus without complication ([place])    type 2 controlled with diet    Diabetic toe ulcer ([place]) 11/05/2018   History of kidney stones    passed 1 several years ago   Hyperlipidemia    Hypertension    not on medications   Peripheral vascular disease ([place])    stent in left leg due to anerysm     Past Surgical History:  Procedure Laterality Date    Left Transmetatarsal Ampuation (Left Foot)  08/20/2020   ABDOMINAL AORTOGRAM W/LOWER EXTREMITY N/A 08/06/2020   Procedure: ABDOMINAL AORTOGRAM W/LOWER EXTREMITY;  Surgeon: Marty Heck, MD;  Location: [place] [facility];  Service: Cardiovascular;  Laterality: N/A;   AMPUTATION Right 11/07/2018   Procedure: RIGHT 3RD TOE AMPUTATION;  Surgeon: Newt Minion, MD;  Location: [place];  Service: Orthopedics;  Laterality: Right;   AMPUTATION Left 08/20/2020   Procedure: Left Transmetatarsal Ampuation;  Surgeon: Marty Heck, MD;  Location: [place];  Service: Vascular;  Laterality: Left;    AMPUTATION Left 10/03/2020   Procedure: LEFT BELOW KNEE AMPUTATION;  Surgeon: Marty Heck, MD;  Location: [place];  Service: Vascular;  Laterality: Left;   AMPUTATION TOE Left    big toe and one next to it   CATARACT EXTRACTION Bilateral    EYE SURGERY Bilateral    cataracts removed   INSERTION OF ILIAC STENT Left    PERIPHERAL VASCULAR BALLOON ANGIOPLASTY Left 08/06/2020   Procedure: PERIPHERAL VASCULAR BALLOON ANGIOPLASTY;  Surgeon: Marty Heck, MD;  Location: [place] [facility];  Service: Cardiovascular;  Laterality: Left;  Peroneal artery.   TOE AMPUTATION Left 2015   TOTAL HIP ARTHROPLASTY Left 05/23/2020   Procedure: LEFT TOTAL HIP ARTHROPLASTY ANTERIOR APPROACH;  Surgeon: Mcarthur Rossetti, MD;  [facility];  Service: Orthopedics;  Laterality: Left;   TOTAL HIP ARTHROPLASTY Right 07/31/2021   Procedure: RIGHT TOTAL HIP ARTHROPLASTY ANTERIOR APPROACH;  Surgeon: Mcarthur Rossetti, MD;  [facility];  Service: Orthopedics;  Laterality: Right;   WOUND DEBRIDEMENT Left 09/11/2020   Procedure: DEBRIDEMENT OF LEFT TRANSMETATARSAL WOUND;  Surgeon: Cherre Robins, MD;  Location: [place];  Service: Vascular;  Laterality: Left;    There were no vitals filed for this visit.   Subjective Assessment - 10/12/21 0852     Subjective He relays no pain today in his hip but a  stinging pain in his left redual lower limb. He relays he still feels weak after recovering from [place] last week.    Pertinent History Left TTTA, left THA (05/23/20), arthritis, DM2, HTN, HLD, PVD    Patient Stated Goals Wants to use prosthesis to return to some work, get back in community, be mobile, work in gym in his garage    Pain Onset More than a month ago    Pain Onset More than a month ago              [facility] Adult PT Treatment/Exercise - 10/12/21 0001       Ambulation/Gait   Ambulation/Gait Yes    Ambulation/Gait Assistance 5: Supervision    Ambulation Distance (Feet) 75  Feet   59' X2 with cane, 150 ft with rollator   Assistive device Straight cane;Prosthesis;None      Neuro Re-ed    Neuro Re-ed Details  standing balance with unilat alternating rows and chest presses with blue X20 bilat, sidestepping with red resistance around ankles red X 5 round trips      Knee/Hip Exercises: Stretches   Active Hamstring Stretch 3 reps;30 seconds;Both    Active Hamstring Stretch Limitations supine with strap    Hip Flexor Stretch Right;3 reps;30 seconds    Hip Flexor Stretch Limitations supine Rt leg off EOB    ITB Stretch Right;3 reps;30 seconds    ITB Stretch Limitations supine with strap RLE into adduction    Other Knee/Hip Stretches Rt leg single knee to chest stretch 2 X 30 sec      Knee/Hip Exercises: Machines for Strengthening   Total Gym Leg Press Shuttle leg press 162# DL 2X15 & RLE only 87# 15 reps 2 sets focus on maximal hip ext,      Knee/Hip Exercises: Standing   Other Standing Knee Exercises 6 inch stepping up with Rt and down with right bilat UE support in bars X 10      Knee/Hip Exercises: Supine   Straight Leg Raises Right;10 reps;2 sets      Knee/Hip Exercises: Sidelying   Hip ABduction Right;2 sets;10 reps    Hip ABduction Limitations holding 3 sec    Clams RLE 2X15 reps red theraband with cues visually (target) tactile & verbally to decrease pelvic rotation creating movement.                       PT Short Term Goals - 09/16/21 0937       PT SHORT TERM GOAL #1   Title He will be I and compliant with HEP    Time 4    Period Weeks    Status Achieved    Target Date 09/15/21      PT SHORT TERM GOAL #2   Title He will be able to ambulate 200 feet with SPC mod I to supervision    Baseline met 1/4    Time 4    Period Weeks    Status Achieved    Target Date 09/15/21      PT SHORT TERM GOAL #3   Title -      PT SHORT TERM GOAL #4   Title -               PT Long Term Goals - 09/09/21 0854       PT LONG TERM  GOAL #1   Title He will improve FOTO functional score to 59%    Time 8  Period Weeks    Status On-going    Target Date 10/13/21      PT LONG TERM GOAL #2   Title Patient will improve Rt hip strength to 5/5 MMT all planes for functional activity    Baseline 4/5    Time 8    Period Weeks    Status On-going    Target Date 10/13/21      PT LONG TERM GOAL #3   Title Berg Balance >/= 43/56 to indicate lower fall risk    Baseline Berg 39/56    Time 8    Period Weeks    Status On-going      PT LONG TERM GOAL #4   Title Patient ambulates 300' with SPC to no AD    Baseline can do this with rollator    Time 8    Period Weeks    Status On-going      PT LONG TERM GOAL #5   Title Patient will improve Rt hip pain to overall less than 3/10 with ususal activity    Baseline 4 today, can still get up to 10    Time 8    Period Weeks    Status On-going    Target Date 10/13/21                   Plan - 10/12/21 0955     Clinical Impression Statement Session limited by left residual limb pain and overall fatigue after getting COVID 19 last week. His Rt hip is doing reasonably well considering. He will need recert next visit.    Personal Factors and Comorbidities Comorbidity 3+;Fitness;Time since onset of injury/illness/exacerbation;Past/Current Experience    Comorbidities RT THA, Left TTA, left THA (05/23/20), arthritis, DM2, HTN, HLD, PVD    Examination-Activity Limitations Locomotion Level;Squat;Stairs;Stand;Transfers;Lift;Sleep    Examination-Participation Restrictions Community Activity;Occupation;Driving;Shop    Stability/Clinical Decision Making Evolving/Moderate complexity    Rehab Potential Good    PT Frequency 2x / week    PT Duration 8 weeks    PT Treatment/Interventions ADLs/Self Care Home Management;DME Instruction;Gait training;Stair training;Functional mobility training;Therapeutic activities;Therapeutic exercise;Balance training;Neuromuscular  re-education;Patient/family education;Prosthetic Training;Vestibular;Dry needling;Manual techniques;Electrical Stimulation;Cryotherapy;Ultrasound;Passive range of motion;Joint Manipulations    PT Next Visit Plan recert    PT Home Exercise Plan squats at counter top with UE support and chair behind him, standing hip flexion and abd with red band, standing hip flexor stretch    Consulted and Agree with Plan of Care Patient             Patient will benefit from skilled therapeutic intervention in order to improve the following deficits and impairments:  Abnormal gait, Decreased activity tolerance, Decreased balance, Decreased endurance, Decreased knowledge of use of DME, Decreased mobility, Decreased range of motion, Decreased skin integrity, Decreased strength, Increased edema, Postural dysfunction, Prosthetic Dependency, Pain, Impaired flexibility  Visit Diagnosis: Pain in right hip  Other abnormalities of gait and mobility  Unsteadiness on feet  Stiffness of right hip, not elsewhere classified  Muscle weakness (generalized)     Problem List Patient Active Problem List   Diagnosis Date Noted   S/P BKA (below knee amputation) unilateral, left ([place]) 10/03/2020   Non-healing wound of lower extremity 10/03/2020   History of amputation of left foot through metatarsal bone ([place]) 09/11/2020   PAD (peripheral artery disease) ([place]) 08/20/2020   Status post total replacement of left hip 06/05/2020   Unilateral primary osteoarthritis, right hip 04/17/2020   Unilateral primary osteoarthritis, left hip 04/17/2020  Amputated toe, right ([place]) 11/22/2018   Type 2 diabetes mellitus with foot ulcer, without long-term current use of insulin ([place]) 11/22/2018   Mixed hyperlipidemia 11/22/2018   Aneurysm of left popliteal artery ([place]) 11/22/2018   Status post peripheral artery angioplasty with insertion of stent 11/22/2018   Osteomyelitis of third toe of right foot ([place])    Cellulitis of third  toe of right foot     Debbe Odea, PT,DPT 10/12/2021, 10:18 AM  [facility] Physical Therapy [address] [place], [place], [address] Phone: [phone]   Fax:  [phone]  Name: Anthony Park MRN: [id number] Date of Birth: [birth year]

## 2021-10-13 ENCOUNTER — Ambulatory Visit (INDEPENDENT_AMBULATORY_CARE_PROVIDER_SITE_OTHER): Payer: BC Managed Care – PPO | Admitting: Physical Therapy

## 2021-10-13 DIAGNOSIS — M6281 Muscle weakness (generalized): Secondary | ICD-10-CM

## 2021-10-13 DIAGNOSIS — R2689 Other abnormalities of gait and mobility: Secondary | ICD-10-CM

## 2021-10-13 DIAGNOSIS — M25551 Pain in right hip: Secondary | ICD-10-CM

## 2021-10-13 DIAGNOSIS — R2681 Unsteadiness on feet: Secondary | ICD-10-CM | POA: Diagnosis not present

## 2021-10-13 DIAGNOSIS — M25651 Stiffness of right hip, not elsewhere classified: Secondary | ICD-10-CM | POA: Diagnosis not present

## 2021-10-13 NOTE — Therapy (Signed)
[facility] Physical Therapy [address] [place], [place], [address] Phone: [phone]   Fax:  [phone]  Physical Therapy Treatment/Recert  Patient Details  Name: Anthony Park MRN: [id number] Date of Birth: 08/23/1961 Referring Provider (PT): Carney Bern   Encounter Date: 10/13/2021   PT End of Session - 10/13/21 0934     Visit Number 14    Number of Visits 24    Date for PT Re-Evaluation 11/24/21    Authorization Type BCBS comm ppo    Authorization Time Period 2023 Deductible ($1,450.00)$0.00 Paid $1,450.00 to go, Out-of-Pocket Limit ($2,650.00)  $0.00 Paid $2,650.00 to go    Authorization - Visit Number 6    Authorization - Number of Visits 116    PT Start Time 0845    PT Stop Time 0926    PT Time Calculation (min) 41 min    Activity Tolerance Patient tolerated treatment well    Behavior During Therapy [facility] for tasks assessed/performed             Past Medical History:  Diagnosis Date   Arthritis    Diabetes mellitus without complication ([place])    type 2 controlled with diet    Diabetic toe ulcer ([place]) 11/05/2018   History of kidney stones    passed 1 several years ago   Hyperlipidemia    Hypertension    not on medications   Peripheral vascular disease ([place])    stent in left leg due to anerysm     Past Surgical History:  Procedure Laterality Date    Left Transmetatarsal Ampuation (Left Foot)  08/20/2020   ABDOMINAL AORTOGRAM W/LOWER EXTREMITY N/A 08/06/2020   Procedure: ABDOMINAL AORTOGRAM W/LOWER EXTREMITY;  Surgeon: Marty Heck, MD;  Location: [place] [facility];  Service: Cardiovascular;  Laterality: N/A;   AMPUTATION Right 11/07/2018   Procedure: RIGHT 3RD TOE AMPUTATION;  Surgeon: Newt Minion, MD;  Location: [place];  Service: Orthopedics;  Laterality: Right;   AMPUTATION Left 08/20/2020   Procedure: Left Transmetatarsal Ampuation;  Surgeon: Marty Heck, MD;  Location: [place];  Service: Vascular;  Laterality:  Left;   AMPUTATION Left 10/03/2020   Procedure: LEFT BELOW KNEE AMPUTATION;  Surgeon: Marty Heck, MD;  Location: [place];  Service: Vascular;  Laterality: Left;   AMPUTATION TOE Left    big toe and one next to it   CATARACT EXTRACTION Bilateral    EYE SURGERY Bilateral    cataracts removed   INSERTION OF ILIAC STENT Left    PERIPHERAL VASCULAR BALLOON ANGIOPLASTY Left 08/06/2020   Procedure: PERIPHERAL VASCULAR BALLOON ANGIOPLASTY;  Surgeon: Marty Heck, MD;  Location: [place] [facility];  Service: Cardiovascular;  Laterality: Left;  Peroneal artery.   TOE AMPUTATION Left 2015   TOTAL HIP ARTHROPLASTY Left 05/23/2020   Procedure: LEFT TOTAL HIP ARTHROPLASTY ANTERIOR APPROACH;  Surgeon: Mcarthur Rossetti, MD;  [facility];  Service: Orthopedics;  Laterality: Left;   TOTAL HIP ARTHROPLASTY Right 07/31/2021   Procedure: RIGHT TOTAL HIP ARTHROPLASTY ANTERIOR APPROACH;  Surgeon: Mcarthur Rossetti, MD;  [facility];  Service: Orthopedics;  Laterality: Right;   WOUND DEBRIDEMENT Left 09/11/2020   Procedure: DEBRIDEMENT OF LEFT TRANSMETATARSAL WOUND;  Surgeon: Cherre Robins, MD;  Location: [place];  Service: Vascular;  Laterality: Left;    There were no vitals filed for this visit.   Subjective Assessment - 10/13/21 0933     Subjective His Rt his is doing great but still limited by  some left residual limb pain and will follow up with MD thursday.                [facility] PT Assessment - 10/13/21 0001       Assessment   Medical Diagnosis Rt THA (anterior approach)    Referring Provider (PT) Kriste Basque    Onset Date/Surgical Date 07/31/21      PROM   Right Hip Flexion 110    Right Hip External Rotation  30    Right Hip Internal Rotation  20    Right Hip ABduction 25      Strength   Overall Strength Comments Rt hip/knee strength 5/5 MMT, does have strength limitations in left hip      Ambulation/Gait   Ambulation/Gait Yes     Ambulation/Gait Assistance 5: Supervision    Ambulation Distance (Feet) 300 Feet    Assistive device Straight cane;Prosthesis      Berg Balance Test   Sit to Stand Able to stand  independently using hands    Standing Unsupported Able to stand safely 2 minutes    Sitting with Back Unsupported but Feet Supported on Floor or Stool Able to sit safely and securely 2 minutes    Stand to Sit Controls descent by using hands    Transfers Able to transfer safely, definite need of hands    Standing Unsupported with Eyes Closed Able to stand 10 seconds with supervision    Standing Unsupported with Feet Together Able to place feet together independently and stand 1 minute safely    From Standing, Reach Forward with Outstretched Arm Can reach forward >12 cm safely (5")    From Standing Position, Pick up Object from Floor Able to pick up shoe, needs supervision    From Standing Position, Turn to Look Behind Over each Shoulder Looks behind from both sides and weight shifts well    Turn 360 Degrees Able to turn 360 degrees safely but slowly    Standing Unsupported, Alternately Place Feet on Step/Stool Able to complete >2 steps/needs minimal assist    Standing Unsupported, One Foot in Front Able to take small step independently and hold 30 seconds    Standing on One Leg Tries to lift leg/unable to hold 3 seconds but remains standing independently    Total Score 40                           [facility] Adult PT Treatment/Exercise - 10/13/21 0001       Knee/Hip Exercises: Stretches   Active Hamstring Stretch 3 reps;30 seconds;Both    Active Hamstring Stretch Limitations supine with strap    Hip Flexor Stretch Right;3 reps;30 seconds    Hip Flexor Stretch Limitations supine Rt leg off EOB    ITB Stretch Right;3 reps;30 seconds    ITB Stretch Limitations supine with strap RLE into adduction    Other Knee/Hip Stretches Rt leg single knee to chest stretch 2 X 30 sec      Knee/Hip Exercises:  Standing   Other Standing Knee Exercises sidestepping with red band X 5 round trips, standing hip extensions  X15 bilat with red band                       PT Short Term Goals - 09/16/21 5844       PT SHORT TERM GOAL #1   Title He will be I and compliant with HEP  Time 4    Period Weeks    Status Achieved    Target Date 09/15/21      PT SHORT TERM GOAL #2   Title He will be able to ambulate 200 feet with SPC mod I to supervision    Baseline met 1/4    Time 4    Period Weeks    Status Achieved    Target Date 09/15/21      PT SHORT TERM GOAL #3   Title -      PT SHORT TERM GOAL #4   Title -               PT Long Term Goals - 10/13/21 0856       PT LONG TERM GOAL #1   Title He will improve FOTO functional score to 59%    Time 8    Period Weeks    Status On-going    Target Date 11/24/21      PT LONG TERM GOAL #2   Title Patient will improve Rt hip strength to 5/5 MMT all planes for functional activity    Baseline met 1/31    Time 8    Period Weeks    Status Achieved    Target Date 10/13/21      PT LONG TERM GOAL #3   Title Berg Balance >/= 43/56 to indicate lower fall risk    Baseline 40 on 1/31    Time 8    Period Weeks    Status On-going    Target Date 11/24/21      PT LONG TERM GOAL #4   Title Patient ambulates 300' with SPC to no AD    Baseline met 1/31    Time 8    Period Weeks    Status Achieved      PT LONG TERM GOAL #5   Title Patient will improve Rt hip pain to overall less than 3/10 with ususal activity    Baseline no pain this week in rt hip but is limited with Lt residual limb pain    Time 8    Period Weeks    Status Achieved    Target Date 10/13/21                   Plan - 10/13/21 0935     Clinical Impression Statement Recert today as his POC date was up. He has made great progress with his Rt hip S/P THA however he still functionally limited with standing tolerance, ambulation, balance and tightness  due to his Lt BKA and residual limb pain/infection. PT recommending 4-6 more weeks of PT to address these functional impairments.    Personal Factors and Comorbidities Comorbidity 3+;Fitness;Time since onset of injury/illness/exacerbation;Past/Current Experience    Comorbidities RT THA, Left TTA, left THA (05/23/20), arthritis, DM2, HTN, HLD, PVD    Examination-Activity Limitations Locomotion Level;Squat;Stairs;Stand;Transfers;Lift;Sleep    Examination-Participation Restrictions Community Activity;Occupation;Driving;Shop    Stability/Clinical Decision Making Evolving/Moderate complexity    Rehab Potential Good    PT Frequency 2x / week    PT Duration 8 weeks    PT Treatment/Interventions ADLs/Self Care Home Management;DME Instruction;Gait training;Stair training;Functional mobility training;Therapeutic activities;Therapeutic exercise;Balance training;Neuromuscular re-education;Patient/family education;Prosthetic Training;Vestibular;Dry needling;Manual techniques;Electrical Stimulation;Cryotherapy;Ultrasound;Passive range of motion;Joint Manipulations    PT Next Visit Plan gait, balance, standing tolerance, strength and ROM.    PT Home Exercise Plan squats at counter top with UE support and chair behind him, standing hip flexion and abd with red band, standing  hip flexor stretch    Consulted and Agree with Plan of Care Patient             Patient will benefit from skilled therapeutic intervention in order to improve the following deficits and impairments:  Abnormal gait, Decreased activity tolerance, Decreased balance, Decreased endurance, Decreased knowledge of use of DME, Decreased mobility, Decreased range of motion, Decreased skin integrity, Decreased strength, Increased edema, Postural dysfunction, Prosthetic Dependency, Pain, Impaired flexibility  Visit Diagnosis: Pain in right hip  Other abnormalities of gait and mobility  Unsteadiness on feet  Stiffness of right hip, not elsewhere  classified  Muscle weakness (generalized)     Problem List Patient Active Problem List   Diagnosis Date Noted   S/P BKA (below knee amputation) unilateral, left ([place]) 10/03/2020   Non-healing wound of lower extremity 10/03/2020   History of amputation of left foot through metatarsal bone ([place]) 09/11/2020   PAD (peripheral artery disease) ([place]) 08/20/2020   Status post total replacement of left hip 06/05/2020   Unilateral primary osteoarthritis, right hip 04/17/2020   Unilateral primary osteoarthritis, left hip 04/17/2020   Amputated toe, right ([place]) 11/22/2018   Type 2 diabetes mellitus with foot ulcer, without long-term current use of insulin ([place]) 11/22/2018   Mixed hyperlipidemia 11/22/2018   Aneurysm of left popliteal artery ([place]) 11/22/2018   Status post peripheral artery angioplasty with insertion of stent 11/22/2018   Osteomyelitis of third toe of right foot ([place])    Cellulitis of third toe of right foot     Debbe Odea, PT,DPT 10/13/2021, 9:40 AM  [facility] [address] [place], [place], [address] Phone: [phone]   Fax:  [phone]  Name: Anthony Park MRN: [id number] Date of Birth: 24-Oct-1960

## 2021-10-14 ENCOUNTER — Encounter: Payer: BC Managed Care – PPO | Admitting: Physical Therapy

## 2021-10-14 DIAGNOSIS — E1142 Type 2 diabetes mellitus with diabetic polyneuropathy: Secondary | ICD-10-CM | POA: Diagnosis not present

## 2021-10-14 DIAGNOSIS — I10 Essential (primary) hypertension: Secondary | ICD-10-CM | POA: Diagnosis not present

## 2021-10-14 DIAGNOSIS — Z1339 Encounter for screening examination for other mental health and behavioral disorders: Secondary | ICD-10-CM | POA: Diagnosis not present

## 2021-10-14 DIAGNOSIS — Z1331 Encounter for screening for depression: Secondary | ICD-10-CM | POA: Diagnosis not present

## 2021-10-14 DIAGNOSIS — Z0000 Encounter for general adult medical examination without abnormal findings: Secondary | ICD-10-CM | POA: Diagnosis not present

## 2021-10-15 ENCOUNTER — Ambulatory Visit (INDEPENDENT_AMBULATORY_CARE_PROVIDER_SITE_OTHER): Payer: BC Managed Care – PPO | Admitting: Orthopedic Surgery

## 2021-10-15 ENCOUNTER — Other Ambulatory Visit: Payer: Self-pay

## 2021-10-15 DIAGNOSIS — Z89512 Acquired absence of left leg below knee: Secondary | ICD-10-CM

## 2021-10-15 DIAGNOSIS — S88112A Complete traumatic amputation at level between knee and ankle, left lower leg, initial encounter: Secondary | ICD-10-CM

## 2021-10-18 ENCOUNTER — Encounter: Payer: Self-pay | Admitting: Orthopedic Surgery

## 2021-10-18 NOTE — Progress Notes (Addendum)
Office Visit Note   Patient: Anthony Park           Date of Birth: Sep 21, 1960           MRN: [id number] Visit Date: 10/15/2021              Requested by: Michael Boston, MD [address] [place],  [place] [address] PCP: Michael Boston, MD  Chief Complaint  Patient presents with   Left Leg - Follow-up    Hx left BKA      HPI: Patient is a 61 year old gentleman who is status post left transtibial amputation January 2022 essentially 1 year ago.  Patient complains of end bearing pain with ambulation and has pain going downhill.  Patient has had multiple modifications with his pin liner and still does not have a satisfactory fit.  Assessment & Plan: Visit Diagnoses:  1. Below-knee amputation of left lower extremity ([place])     Plan: I feel patient would benefit from a suction liner.  A prescription was provided for [employer].  Patient would functionally benefit from a K3 hydraulic foot in conjunction with his new socket to reduce socket pressures allow him to walk over uneven terrain.  Patient [place]  Follow-Up Instructions: Return in about 4 weeks (around 11/12/2021).   Ortho Exam  Patient is alert, oriented, no adenopathy, well-dressed, normal affect, normal respiratory effort. Examination patient has an bearing skin changes secondary to subsiding into the socket.  There is pain over the patella tendon.  Pain over the medial and lateral tibial plateau.  Patient is an existing left transtibial  amputee.  Patient's current comorbidities are not expected to impact the ability to function with the prescribed prosthesis. Patient verbally communicates a strong desire to use a prosthesis. Patient currently requires mobility aids to ambulate without a prosthesis.  Expects not to use mobility aids with a new prosthesis.  Patient is a K3 level ambulator that spends a lot of time walking around on uneven terrain over obstacles, up and down stairs, and ambulates  with a variable cadence.      Imaging: No results found. No images are attached to the encounter.  Labs: Lab Results  Component Value Date   HGBA1C 6.5 (H) 07/24/2021   HGBA1C 6.2 (H) 10/03/2020   HGBA1C 6.8 (H) 05/20/2020   CRP 3.8 (H) 10/03/2020   CRP 53.7 (H) 09/23/2020   LABURIC 6.9 07/29/2020   REPTSTATUS 09/16/2020 FINAL 09/11/2020   GRAMSTAIN  09/11/2020    RARE WBC PRESENT, PREDOMINANTLY MONONUCLEAR MODERATE GRAM NEGATIVE RODS FEW GRAM VARIABLE ROD    CULT  09/11/2020    MODERATE PSEUDOMONAS AERUGINOSA RARE ESCHERICHIA COLI NO ANAEROBES ISOLATED Performed at [facility], [place] [address] [place], [place] [address]    LABORGA PSEUDOMONAS AERUGINOSA 09/11/2020   LABORGA ESCHERICHIA COLI 09/11/2020     Lab Results  Component Value Date   ALBUMIN 3.4 (L) 10/03/2020   ALBUMIN 3.6 09/11/2020   ALBUMIN 3.5 11/06/2018    Lab Results  Component Value Date   MG 1.9 08/24/2020   MG 1.6 (L) 08/23/2020   No results found for: VD25OH  No results found for: PREALBUMIN CBC EXTENDED Latest Ref Rng & Units 07/24/2021 10/06/2020 10/05/2020  WBC 4.0 - 10.5 K/uL 8.4 7.4 8.0  RBC 4.22 - 5.81 MIL/uL 4.89 2.83(L) 2.80(L)  HGB 13.0 - 17.0 g/dL 13.2 8.1(L) 8.1(L)  HCT 39.0 - 52.0 % 40.4 26.1(L) 25.4(L)  PLT 150 - 400 K/uL 240 254 262  NEUTROABS 1.7 - 7.7 K/uL - - -  LYMPHSABS 0.7 - 4.0 K/uL - - -     There is no height or weight on file to calculate BMI.  Orders:  No orders of the defined types were placed in this encounter.  No orders of the defined types were placed in this encounter.    Procedures: No procedures performed  Clinical Data: No additional findings.  ROS:  All other systems negative, except as noted in the HPI. Review of Systems  Objective: Vital Signs: There were no vitals taken for this visit.  Specialty Comments:  No specialty comments available.  PMFS History: Patient Active Problem List   Diagnosis Date Noted   S/P BKA  (below knee amputation) unilateral, left ([place]) 10/03/2020   Non-healing wound of lower extremity 10/03/2020   History of amputation of left foot through metatarsal bone ([place]) 09/11/2020   PAD (peripheral artery disease) ([place]) 08/20/2020   Status post total replacement of left hip 06/05/2020   Unilateral primary osteoarthritis, right hip 04/17/2020   Unilateral primary osteoarthritis, left hip 04/17/2020   Amputated toe, right ([place]) 11/22/2018   Type 2 diabetes mellitus with foot ulcer, without long-term current use of insulin ([place]) 11/22/2018   Mixed hyperlipidemia 11/22/2018   Aneurysm of left popliteal artery ([place]) 11/22/2018   Status post peripheral artery angioplasty with insertion of stent 11/22/2018   Osteomyelitis of third toe of right foot (H[facility])    Cellulitis of third toe of right foot    Past Medical History:  Diagnosis Date   Arthritis    Diabetes mellitus without complication ([place])    type 2 controlled with diet    Diabetic toe ulcer ([place]) 11/05/2018   History of kidney stones    passed 1 several years ago   Hyperlipidemia    Hypertension    not on medications   Peripheral vascular disease ([place])    stent in left leg due to anerysm     Family History  Problem Relation Age of Onset   COPD Mother    Cataracts Mother    Brain cancer Father    Hypertension Brother     Past Surgical History:  Procedure Laterality Date    Left Transmetatarsal Ampuation (Left Foot)  08/20/2020   ABDOMINAL AORTOGRAM W/LOWER EXTREMITY N/A 08/06/2020   Procedure: ABDOMINAL AORTOGRAM W/LOWER EXTREMITY;  Surgeon: Marty Heck, MD;  Location: [facility];  Service: Cardiovascular;  Laterality: N/A;   AMPUTATION Right 11/07/2018   Procedure: RIGHT 3RD TOE AMPUTATION;  Surgeon: Newt Minion, MD;  Location: [place];  Service: Orthopedics;  Laterality: Right;   AMPUTATION Left 08/20/2020   Procedure: Left Transmetatarsal Ampuation;  Surgeon: Marty Heck, MD;  Location: [place];  Service: Vascular;  Laterality: Left;   AMPUTATION Left 10/03/2020   Procedure: LEFT BELOW KNEE AMPUTATION;  Surgeon: Marty Heck, MD;  Location: [place];  Service: Vascular;  Laterality: Left;   AMPUTATION TOE Left    big toe and one next to it   CATARACT EXTRACTION Bilateral    EYE SURGERY Bilateral    cataracts removed   INSERTION OF ILIAC STENT Left    PERIPHERAL VASCULAR BALLOON ANGIOPLASTY Left 08/06/2020   Procedure: PERIPHERAL VASCULAR BALLOON ANGIOPLASTY;  Surgeon: Marty Heck, MD;  Location: [place] [facility];  Service: Cardiovascular;  Laterality: Left;  Peroneal artery.   TOE AMPUTATION Left 2015   TOTAL HIP ARTHROPLASTY  Left 05/23/2020   Procedure: LEFT TOTAL HIP ARTHROPLASTY ANTERIOR APPROACH;  Surgeon: Mcarthur Rossetti, MD;  Location: [facility];  Service: Orthopedics;  Laterality: Left;   TOTAL HIP ARTHROPLASTY Right 07/31/2021   Procedure: RIGHT TOTAL HIP ARTHROPLASTY ANTERIOR APPROACH;  Surgeon: Mcarthur Rossetti, MD;  Location: [facility];  Service: Orthopedics;  Laterality: Right;   WOUND DEBRIDEMENT Left 09/11/2020   Procedure: DEBRIDEMENT OF LEFT TRANSMETATARSAL WOUND;  Surgeon: Cherre Robins, MD;  [facility];  Service: Vascular;  Laterality: Left;   Social History   Occupational History   Not on file  Tobacco Use   Smoking status: Former    Packs/day: 0.50    Years: 41.00    Pack years: 20.50    Types: Cigarettes    Quit date: 07/20/2020    Years since quitting: 1.2   Smokeless tobacco: Never  Vaping Use   Vaping Use: Never used  Substance and Sexual Activity   Alcohol use: Yes    Alcohol/week: 14.0 standard drinks    Types: 14 Standard drinks or equivalent per week    Comment: occas.   Drug use: Not Currently    Comment: hx of marijuana 15 years ago    Sexual activity: Yes

## 2021-10-20 ENCOUNTER — Other Ambulatory Visit: Payer: Self-pay

## 2021-10-20 ENCOUNTER — Ambulatory Visit (INDEPENDENT_AMBULATORY_CARE_PROVIDER_SITE_OTHER): Payer: BC Managed Care – PPO | Admitting: Physical Therapy

## 2021-10-20 ENCOUNTER — Encounter: Payer: Self-pay | Admitting: Physical Therapy

## 2021-10-20 DIAGNOSIS — R2681 Unsteadiness on feet: Secondary | ICD-10-CM

## 2021-10-20 DIAGNOSIS — M25651 Stiffness of right hip, not elsewhere classified: Secondary | ICD-10-CM | POA: Diagnosis not present

## 2021-10-20 DIAGNOSIS — R2689 Other abnormalities of gait and mobility: Secondary | ICD-10-CM

## 2021-10-20 DIAGNOSIS — M6281 Muscle weakness (generalized): Secondary | ICD-10-CM

## 2021-10-20 DIAGNOSIS — M25551 Pain in right hip: Secondary | ICD-10-CM | POA: Diagnosis not present

## 2021-10-20 NOTE — Therapy (Signed)
[facility] [address] [place], [place], [address] Phone: [phone]   Fax:  [phone]  Physical Therapy Treatment  Patient Details  Name: Anthony Park MRN: [id number] Date of Birth: Jan 30, 1961 Referring Provider (PT): Carney Bern   Encounter Date: 10/20/2021   PT End of Session - 10/20/21 0810     Visit Number 15    Number of Visits 24    Date for PT Re-Evaluation 11/24/21    Authorization Type BCBS comm ppo    Authorization Time Period 2023 Deductible ($1,450.00)$0.00 Paid $1,450.00 to go, Out-of-Pocket Limit ($2,650.00)  $0.00 Paid $2,650.00 to go    Authorization - Visit Number 7    Authorization - Number of Visits 116    PT Start Time 0800    PT Stop Time 0845    PT Time Calculation (min) 45 min    Activity Tolerance Patient tolerated treatment well    Behavior During Therapy [facility] for tasks assessed/performed             Past Medical History:  Diagnosis Date   Arthritis    Diabetes mellitus without complication ([place])    type 2 controlled with diet    Diabetic toe ulcer ([place]) 11/05/2018   History of kidney stones    passed 1 several years ago   Hyperlipidemia    Hypertension    not on medications   Peripheral vascular disease ([place])    stent in left leg due to anerysm     Past Surgical History:  Procedure Laterality Date    Left Transmetatarsal Ampuation (Left Foot)  08/20/2020   ABDOMINAL AORTOGRAM W/LOWER EXTREMITY N/A 08/06/2020   Procedure: ABDOMINAL AORTOGRAM W/LOWER EXTREMITY;  Surgeon: Marty Heck, MD;  Location: [place] [facility];  Service: Cardiovascular;  Laterality: N/A;   AMPUTATION Right 11/07/2018   Procedure: RIGHT 3RD TOE AMPUTATION;  Surgeon: Newt Minion, MD;  Location: [place];  Service: Orthopedics;  Laterality: Right;   AMPUTATION Left 08/20/2020   Procedure: Left Transmetatarsal Ampuation;  Surgeon: Marty Heck, MD;  Location: [place];  Service: Vascular;  Laterality: Left;    AMPUTATION Left 10/03/2020   Procedure: LEFT BELOW KNEE AMPUTATION;  Surgeon: Marty Heck, MD;  Location: [place];  Service: Vascular;  Laterality: Left;   AMPUTATION TOE Left    big toe and one next to it   CATARACT EXTRACTION Bilateral    EYE SURGERY Bilateral    cataracts removed   INSERTION OF ILIAC STENT Left    PERIPHERAL VASCULAR BALLOON ANGIOPLASTY Left 08/06/2020   Procedure: PERIPHERAL VASCULAR BALLOON ANGIOPLASTY;  Surgeon: Marty Heck, MD;  Location: [place] [facility];  Service: Cardiovascular;  Laterality: Left;  Peroneal artery.   TOE AMPUTATION Left 2015   TOTAL HIP ARTHROPLASTY Left 05/23/2020   Procedure: LEFT TOTAL HIP ARTHROPLASTY ANTERIOR APPROACH;  Surgeon: Mcarthur Rossetti, MD;  [facility];  Service: Orthopedics;  Laterality: Left;   TOTAL HIP ARTHROPLASTY Right 07/31/2021   Procedure: RIGHT TOTAL HIP ARTHROPLASTY ANTERIOR APPROACH;  Surgeon: Mcarthur Rossetti, MD;  [facility];  Service: Orthopedics;  Laterality: Right;   WOUND DEBRIDEMENT Left 09/11/2020   Procedure: DEBRIDEMENT OF LEFT TRANSMETATARSAL WOUND;  Surgeon: Cherre Robins, MD;  Location: [place];  Service: Vascular;  Laterality: Left;    There were no vitals filed for this visit.   Subjective Assessment - 10/20/21 0800     Subjective He saw Dr. Sharol Given who ordered new prosthesis with suction  socket & hydraulic foot/ankle.  He was casted yesterday with anticipated delivery in ~2 weeks.    Pertinent History Left TTTA, left THA (05/23/20), arthritis, DM2, HTN, HLD, PVD    Patient Stated Goals Wants to use prosthesis to return to some work, get back in community, be mobile, work in gym in his garage    Pain Score 0-No pain    Pain Location Hip    Pain Score 1    Pain Location Leg   residual limb   Pain Orientation Left;Anterior    Pain Descriptors / Indicators Burning    Pain Type Chronic pain    Pain Onset More than a month ago    Pain Frequency Intermittent     Aggravating Factors  prosthesis pressure    Pain Relieving Factors removing prosthesis                               [facility] Adult PT Treatment/Exercise - 10/20/21 0800       Transfers   Transfers Floor to Transfer    Floor to Transfer 5: Supervision;With upper extremity assist    Floor to Transfer Details (indicate cue type and reason) PT demo & verbal cues on technique with new prosthetic system to minmize putting hole in suction sleeve.  Pt return demo & verbalized understanding.      Ambulation/Gait   Ambulation/Gait Yes    Ambulation/Gait Assistance 4: Min guard;5: Supervision    Ambulation Distance (Feet) 100 Feet   50' X 3 & 100' X 1   Assistive device Prosthesis      Neuro Re-ed    Neuro Re-ed Details  tandem stance on foam beam 30 sec RLE in front & in back with intermittent touch on //bars      Knee/Hip Exercises: Stretches   Active Hamstring Stretch 3 reps;30 seconds;Both    Active Hamstring Stretch Limitations supine with strap    Hip Flexor Stretch --    Hip Flexor Stretch Limitations --    ITB Stretch --    ITB Stretch Limitations --    Other Knee/Hip Stretches --      Knee/Hip Exercises: Aerobic   Recumbent Bike attempted but unable to get knee around without binding or foot falling off pedal    Nustep Seat 12 level 8 with UE/LE X 10 min      Knee/Hip Exercises: Machines for Strengthening   Total Gym Leg Press Shuttle leg press 162# DL 2X15 & RLE only 87# 15 reps 2 sets focus on maximal hip ext,      Knee/Hip Exercises: Standing   Forward Step Up Right;1 set;10 reps;Hand Hold: 2;Step Height: 6"    Forward Step Up Limitations verbal cues to enengage RLE for strengthening    Step Down Right;1 set;10 reps;Hand Hold: 2;Step Height: 6"    Step Down Limitations verbal cues to enengage RLE for strengthening    SLS with Vectors RLE stance on foam beam Y-reach LLE with BUE support (focus on minimizing WBing)    Other Standing Knee Exercises standing  hip extensions  X15 bilat with green band, RLE on 2" block to enable LLE hip ext (RLE stance) without changing movement pattern.    Other Standing Knee Exercises sidestepping with green band around ankles X 5 round trips //bars      Prosthetics   Prosthetic Care Comments  PT recommended using old prosthesis as water leg.  If submerging in pool or  water, adding 1# wt to ankle will decrease it lifting off pool bottom.    Education Provided Other (comment)   see prosthetic care   Person(s) Educated Patient    Education Method Explanation;Verbal cues    Education Method Verbalized understanding                       PT Short Term Goals - 09/16/21 0937       PT SHORT TERM GOAL #1   Title He will be I and compliant with HEP    Time 4    Period Weeks    Status Achieved    Target Date 09/15/21      PT SHORT TERM GOAL #2   Title He will be able to ambulate 200 feet with SPC mod I to supervision    Baseline met 1/4    Time 4    Period Weeks    Status Achieved    Target Date 09/15/21      PT SHORT TERM GOAL #3   Title -      PT SHORT TERM GOAL #4   Title -               PT Long Term Goals - 10/13/21 0856       PT LONG TERM GOAL #1   Title He will improve FOTO functional score to 59%    Time 8    Period Weeks    Status On-going    Target Date 11/24/21      PT LONG TERM GOAL #2   Title Patient will improve Rt hip strength to 5/5 MMT all planes for functional activity    Baseline met 1/31    Time 8    Period Weeks    Status Achieved    Target Date 10/13/21      PT LONG TERM GOAL #3   Title Berg Balance >/= 43/56 to indicate lower fall risk    Baseline 40 on 1/31    Time 8    Period Weeks    Status On-going    Target Date 11/24/21      PT LONG TERM GOAL #4   Title Patient ambulates 300' with SPC to no AD    Baseline met 1/31    Time 8    Period Weeks    Status Achieved      PT LONG TERM GOAL #5   Title Patient will improve Rt hip pain to  overall less than 3/10 with ususal activity    Baseline no pain this week in rt hip but is limited with Lt residual limb pain    Time 8    Period Weeks    Status Achieved    Target Date 10/13/21                   Plan - 10/20/21 0816     Clinical Impression Statement PT worked on functional strength of right hip which is slowly improving.  He was able to ambulate without assistive device initially with supervision then hand hold once right hip fatigued.  Patient should be able to do even more with new prosthesis.  Pt continues to benefit from skilled PT.    Personal Factors and Comorbidities Comorbidity 3+;Fitness;Time since onset of injury/illness/exacerbation;Past/Current Experience    Comorbidities RT THA, Left TTA, left THA (05/23/20), arthritis, DM2, HTN, HLD, PVD    Examination-Activity Limitations Locomotion Level;Squat;Stairs;Stand;Transfers;Lift;Sleep    Examination-Participation Restrictions Community Activity;Occupation;Driving;Shop  Stability/Clinical Decision Making Evolving/Moderate complexity    Rehab Potential Good    PT Frequency 2x / week    PT Duration 8 weeks    PT Treatment/Interventions ADLs/Self Care Home Management;DME Instruction;Gait training;Stair training;Functional mobility training;Therapeutic activities;Therapeutic exercise;Balance training;Neuromuscular re-education;Patient/family education;Prosthetic Training;Vestibular;Dry needling;Manual techniques;Electrical Stimulation;Cryotherapy;Ultrasound;Passive range of motion;Joint Manipulations    PT Next Visit Plan gait iwithout device as tolerated with residual limb pain, balance, standing tolerance, functional strength and ROM.    PT Home Exercise Plan squats at counter top with UE support and chair behind him, standing hip flexion and abd with red band, standing hip flexor stretch    Consulted and Agree with Plan of Care Patient             Patient will benefit from skilled therapeutic  intervention in order to improve the following deficits and impairments:  Abnormal gait, Decreased activity tolerance, Decreased balance, Decreased endurance, Decreased knowledge of use of DME, Decreased mobility, Decreased range of motion, Decreased skin integrity, Decreased strength, Increased edema, Postural dysfunction, Prosthetic Dependency, Pain, Impaired flexibility  Visit Diagnosis: Pain in right hip  Other abnormalities of gait and mobility  Unsteadiness on feet  Stiffness of right hip, not elsewhere classified  Muscle weakness (generalized)     Problem List Patient Active Problem List   Diagnosis Date Noted   S/P BKA (below knee amputation) unilateral, left ([place]) 10/03/2020   Non-healing wound of lower extremity 10/03/2020   History of amputation of left foot through metatarsal bone ([place]) 09/11/2020   PAD (peripheral artery disease) ([place]) 08/20/2020   Status post total replacement of left hip 06/05/2020   Unilateral primary osteoarthritis, right hip 04/17/2020   Unilateral primary osteoarthritis, left hip 04/17/2020   Amputated toe, right ([place]) 11/22/2018   Type 2 diabetes mellitus with foot ulcer, without long-term current use of insulin ([place]) 11/22/2018   Mixed hyperlipidemia 11/22/2018   Aneurysm of left popliteal artery ([place]) 11/22/2018   Status post peripheral artery angioplasty with insertion of stent 11/22/2018   Osteomyelitis of third toe of right foot (HCC)    Cellulitis of third toe of right foot     Jamey Reas, PT, DPT 10/20/2021, 10:32 AM  [facility] [address] [place], [place], [address] Phone: [phone]   Fax:  [phone]  Name: Anthony Park MRN: [id number] Date of Birth: [birth year]

## 2021-10-22 ENCOUNTER — Ambulatory Visit (INDEPENDENT_AMBULATORY_CARE_PROVIDER_SITE_OTHER): Payer: BC Managed Care – PPO | Admitting: Physical Therapy

## 2021-10-22 ENCOUNTER — Encounter: Payer: Self-pay | Admitting: Physical Therapy

## 2021-10-22 ENCOUNTER — Other Ambulatory Visit: Payer: Self-pay

## 2021-10-22 DIAGNOSIS — M25651 Stiffness of right hip, not elsewhere classified: Secondary | ICD-10-CM | POA: Diagnosis not present

## 2021-10-22 DIAGNOSIS — R2689 Other abnormalities of gait and mobility: Secondary | ICD-10-CM

## 2021-10-22 DIAGNOSIS — M25662 Stiffness of left knee, not elsewhere classified: Secondary | ICD-10-CM

## 2021-10-22 DIAGNOSIS — M6281 Muscle weakness (generalized): Secondary | ICD-10-CM

## 2021-10-22 DIAGNOSIS — R2681 Unsteadiness on feet: Secondary | ICD-10-CM | POA: Diagnosis not present

## 2021-10-22 DIAGNOSIS — R293 Abnormal posture: Secondary | ICD-10-CM

## 2021-10-22 DIAGNOSIS — M25551 Pain in right hip: Secondary | ICD-10-CM

## 2021-10-22 NOTE — Therapy (Signed)
[facility] Physical Therapy [address] [place], [place], [address] Phone: [phone]   Fax:  [phone]  Physical Therapy Treatment  Patient Details  Name: Anthony Park MRN: [id number] Date of Birth: 1961/07/25 Referring Provider (PT): Carney Bern   Encounter Date: 10/22/2021   PT End of Session - 10/22/21 0800     Visit Number 16    Number of Visits 24    Date for PT Re-Evaluation 11/24/21    Authorization Type BCBS comm ppo    Authorization Time Period 2023 Deductible ($1,450.00)$0.00 Paid $1,450.00 to go, Out-of-Pocket Limit ($2,650.00)  $0.00 Paid $2,650.00 to go    Authorization - Visit Number 7    Authorization - Number of Visits 116    PT Start Time 0800    PT Stop Time 0845    PT Time Calculation (min) 45 min    Activity Tolerance Patient tolerated treatment well    Behavior During Therapy [facility] for tasks assessed/performed             Past Medical History:  Diagnosis Date   Arthritis    Diabetes mellitus without complication ([place])    type 2 controlled with diet    Diabetic toe ulcer ([place]) 11/05/2018   History of kidney stones    passed 1 several years ago   Hyperlipidemia    Hypertension    not on medications   Peripheral vascular disease ([place])    stent in left leg due to anerysm     Past Surgical History:  Procedure Laterality Date    Left Transmetatarsal Ampuation (Left Foot)  08/20/2020   ABDOMINAL AORTOGRAM W/LOWER EXTREMITY N/A 08/06/2020   Procedure: ABDOMINAL AORTOGRAM W/LOWER EXTREMITY;  Surgeon: Marty Heck, MD;  Location: [place] [facility];  Service: Cardiovascular;  Laterality: N/A;   AMPUTATION Right 11/07/2018   Procedure: RIGHT 3RD TOE AMPUTATION;  Surgeon: Newt Minion, MD;  Location: [place];  Service: Orthopedics;  Laterality: Right;   AMPUTATION Left 08/20/2020   Procedure: Left Transmetatarsal Ampuation;  Surgeon: Marty Heck, MD;  Location: [place];  Service: Vascular;  Laterality: Left;    AMPUTATION Left 10/03/2020   Procedure: LEFT BELOW KNEE AMPUTATION;  Surgeon: Marty Heck, MD;  Location: [place];  Service: Vascular;  Laterality: Left;   AMPUTATION TOE Left    big toe and one next to it   CATARACT EXTRACTION Bilateral    EYE SURGERY Bilateral    cataracts removed   INSERTION OF ILIAC STENT Left    PERIPHERAL VASCULAR BALLOON ANGIOPLASTY Left 08/06/2020   Procedure: PERIPHERAL VASCULAR BALLOON ANGIOPLASTY;  Surgeon: Marty Heck, MD;  Location: [place] [facility];  Service: Cardiovascular;  Laterality: Left;  Peroneal artery.   TOE AMPUTATION Left 2015   TOTAL HIP ARTHROPLASTY Left 05/23/2020   Procedure: LEFT TOTAL HIP ARTHROPLASTY ANTERIOR APPROACH;  Surgeon: Mcarthur Rossetti, MD;  [facility];  Service: Orthopedics;  Laterality: Left;   TOTAL HIP ARTHROPLASTY Right 07/31/2021   Procedure: RIGHT TOTAL HIP ARTHROPLASTY ANTERIOR APPROACH;  Surgeon: Mcarthur Rossetti, MD;  [facility];  Service: Orthopedics;  Laterality: Right;   WOUND DEBRIDEMENT Left 09/11/2020   Procedure: DEBRIDEMENT OF LEFT TRANSMETATARSAL WOUND;  Surgeon: Cherre Robins, MD;  Location: [place];  Service: Vascular;  Laterality: Left;    There were no vitals filed for this visit.   Subjective Assessment - 10/22/21 0800     Subjective He is anxious to get new prosthesis.  Pertinent History Left TTTA, left THA (05/23/20), arthritis, DM2, HTN, HLD, PVD    Patient Stated Goals Wants to use prosthesis to return to some work, get back in community, be mobile, work in gym in his garage    Pain Score 0-No pain    Pain Location Hip    Pain Score 2    Pain Location Leg   residual limb   Pain Orientation Left;Anterior    Pain Descriptors / Indicators Sore    Pain Type Chronic pain    Pain Onset More than a month ago    Pain Frequency Intermittent    Aggravating Factors  prosthesis pressure    Pain Relieving Factors removing prosthesis    Effect of Pain on Daily  Activities weight bearing on prosthesis                               [facility] Adult PT Treatment/Exercise - 10/22/21 0800       Ambulation/Gait   Ambulation/Gait Yes    Ambulation/Gait Assistance 4: Min guard;5: Supervision    Ambulation Distance (Feet) 100 Feet   50' X 3 & 100' X 1   Assistive device Prosthesis      Neuro Re-ed    Neuro Re-ed Details  tandem stance on foam beam 30 sec RLE in front & in back with intermittent touch on //bars      Knee/Hip Exercises: Stretches   Active Hamstring Stretch 3 reps;30 seconds;Both    Active Hamstring Stretch Limitations supine with strap      Knee/Hip Exercises: Aerobic   Nustep Seat 12 level 8 with UE/LE X 10 min      Knee/Hip Exercises: Machines for Strengthening   Total Gym Leg Press Shuttle leg press 162# DL 7Y89 & RLE only 32# 15 reps 2 sets focus on maximal hip ext,      Knee/Hip Exercises: Standing   Forward Step Up Hand Hold: 2;Both;10 reps   BOSU round side up for unstable surface   Forward Step Up Limitations demo & verbal cues to engage & work rt hip    Step Down Both;1 set;10 reps;Hand Hold: 2   BOSU round side up for unstable surface   Step Down Limitations demo & verbal cues to engage & work rt hip including weight acceptance stepping down onto RLE    SLS with Vectors RLE stance on foam beam Y-reach LLE with BUE support (focus on minimizing WBing)    Other Standing Knee Exercises standing hip extensions  X15 bilat with green band, RLE on foam beam to enable LLE hip ext (RLE stance) without changing movement pattern.    Other Standing Knee Exercises sidestepping on foam beam with green band around ankles X 5 round trips //bars      Prosthetics   Prosthetic Care Comments  PT educated on actual vs percieved weight of prosthesis. And relationship to function and LE fatigue    Education Provided Other (comment)   see prosthetic care   Person(s) Educated Patient    Education Method Explanation;Verbal cues     Education Method Verbalized understanding;Needs further instruction                       PT Short Term Goals - 09/16/21 9115       PT SHORT TERM GOAL #1   Title He will be I and compliant with HEP    Time 4  Period Weeks    Status Achieved    Target Date 09/15/21      PT SHORT TERM GOAL #2   Title He will be able to ambulate 200 feet with SPC mod I to supervision    Baseline met 1/4    Time 4    Period Weeks    Status Achieved    Target Date 09/15/21      PT SHORT TERM GOAL #3   Title -      PT SHORT TERM GOAL #4   Title -               PT Long Term Goals - 10/13/21 0856       PT LONG TERM GOAL #1   Title He will improve FOTO functional score to 59%    Time 8    Period Weeks    Status On-going    Target Date 11/24/21      PT LONG TERM GOAL #2   Title Patient will improve Rt hip strength to 5/5 MMT all planes for functional activity    Baseline met 1/31    Time 8    Period Weeks    Status Achieved    Target Date 10/13/21      PT LONG TERM GOAL #3   Title Berg Balance >/= 43/56 to indicate lower fall risk    Baseline 40 on 1/31    Time 8    Period Weeks    Status On-going    Target Date 11/24/21      PT LONG TERM GOAL #4   Title Patient ambulates 300' with SPC to no AD    Baseline met 1/31    Time 8    Period Weeks    Status Achieved      PT LONG TERM GOAL #5   Title Patient will improve Rt hip pain to overall less than 3/10 with ususal activity    Baseline no pain this week in rt hip but is limited with Lt residual limb pain    Time 8    Period Weeks    Status Achieved    Target Date 10/13/21                   Plan - 10/22/21 0807     Clinical Impression Statement Patient's right hip following THR is improving in functional strength.  His gait is limited more by prosthesis with limb pain than his right hip it appears.  Balance activities challenge him but his balance is improving.    Personal Factors and  Comorbidities Comorbidity 3+;Fitness;Time since onset of injury/illness/exacerbation;Past/Current Experience    Comorbidities RT THA, Left TTA, left THA (05/23/20), arthritis, DM2, HTN, HLD, PVD    Examination-Activity Limitations Locomotion Level;Squat;Stairs;Stand;Transfers;Lift;Sleep    Examination-Participation Restrictions Community Activity;Occupation;Driving;Shop    Stability/Clinical Decision Making Evolving/Moderate complexity    Rehab Potential Good    PT Frequency 2x / week    PT Duration 8 weeks    PT Treatment/Interventions ADLs/Self Care Home Management;DME Instruction;Gait training;Stair training;Functional mobility training;Therapeutic activities;Therapeutic exercise;Balance training;Neuromuscular re-education;Patient/family education;Prosthetic Training;Vestibular;Dry needling;Manual techniques;Electrical Stimulation;Cryotherapy;Ultrasound;Passive range of motion;Joint Manipulations    PT Next Visit Plan gait without device as tolerated with residual limb pain, balance, standing tolerance, functional strength and ROM.    PT Home Exercise Plan squats at counter top with UE support and chair behind him, standing hip flexion and abd with red band, standing hip flexor stretch    Consulted and Agree with Plan  of Care Patient             Patient will benefit from skilled therapeutic intervention in order to improve the following deficits and impairments:  Abnormal gait, Decreased activity tolerance, Decreased balance, Decreased endurance, Decreased knowledge of use of DME, Decreased mobility, Decreased range of motion, Decreased skin integrity, Decreased strength, Increased edema, Postural dysfunction, Prosthetic Dependency, Pain, Impaired flexibility  Visit Diagnosis: Pain in right hip  Other abnormalities of gait and mobility  Unsteadiness on feet  Stiffness of right hip, not elsewhere classified  Muscle weakness (generalized)  Stiffness of left knee, not elsewhere  classified  Abnormal posture     Problem List Patient Active Problem List   Diagnosis Date Noted   S/P BKA (below knee amputation) unilateral, left ([place]) 10/03/2020   Non-healing wound of lower extremity 10/03/2020   History of amputation of left foot through metatarsal bone ([place]) 09/11/2020   PAD (peripheral artery disease) ([place]) 08/20/2020   Status post total replacement of left hip 06/05/2020   Unilateral primary osteoarthritis, right hip 04/17/2020   Unilateral primary osteoarthritis, left hip 04/17/2020   Amputated toe, right ([place]) 11/22/2018   Type 2 diabetes mellitus with foot ulcer, without long-term current use of insulin ([place]) 11/22/2018   Mixed hyperlipidemia 11/22/2018   Aneurysm of left popliteal artery ([place]) 11/22/2018   Status post peripheral artery angioplasty with insertion of stent 11/22/2018   Osteomyelitis of third toe of right foot ([place])    Cellulitis of third toe of right foot     Jamey Reas, PT, DPT 10/22/2021, 1:15 PM  [facility] Physical Therapy [address] [place], [place], [address] Phone: [phone]   Fax:  [phone]  Name: JAQUON GINGERICH MRN: [id number] Date of Birth: 22-Jun-1961

## 2021-10-26 ENCOUNTER — Encounter: Payer: BC Managed Care – PPO | Admitting: Physical Therapy

## 2021-11-03 ENCOUNTER — Other Ambulatory Visit: Payer: Self-pay

## 2021-11-03 ENCOUNTER — Ambulatory Visit (INDEPENDENT_AMBULATORY_CARE_PROVIDER_SITE_OTHER): Payer: BC Managed Care – PPO | Admitting: Physical Therapy

## 2021-11-03 ENCOUNTER — Encounter: Payer: Self-pay | Admitting: Physical Therapy

## 2021-11-03 DIAGNOSIS — M25651 Stiffness of right hip, not elsewhere classified: Secondary | ICD-10-CM | POA: Diagnosis not present

## 2021-11-03 DIAGNOSIS — R2689 Other abnormalities of gait and mobility: Secondary | ICD-10-CM | POA: Diagnosis not present

## 2021-11-03 DIAGNOSIS — R2681 Unsteadiness on feet: Secondary | ICD-10-CM

## 2021-11-03 DIAGNOSIS — M25551 Pain in right hip: Secondary | ICD-10-CM

## 2021-11-03 DIAGNOSIS — M6281 Muscle weakness (generalized): Secondary | ICD-10-CM

## 2021-11-03 NOTE — Therapy (Signed)
[facility] Physical Therapy [address] [place], [place], [address] Phone: [phone]   Fax:  [phone]  Physical Therapy Treatment  Patient Details  Name: Anthony Park MRN: [id number] Date of Birth: Jan 20, 1961 Referring Provider (PT): Carney Bern   Encounter Date: 11/03/2021   PT End of Session - 11/03/21 0800     Visit Number 17    Number of Visits 24    Date for PT Re-Evaluation 11/24/21    Authorization Type BCBS comm ppo    Authorization Time Period 2023 Deductible ($1,450.00)$0.00 Paid $1,450.00 to go, Out-of-Pocket Limit ($2,650.00)  $0.00 Paid $2,650.00 to go    Authorization - Visit Number 8    Authorization - Number of Visits 116    PT Start Time 0800    PT Stop Time 0845    PT Time Calculation (min) 45 min    Activity Tolerance Patient tolerated treatment well    Behavior During Therapy [facility] for tasks assessed/performed             Past Medical History:  Diagnosis Date   Arthritis    Diabetes mellitus without complication ([place])    type 2 controlled with diet    Diabetic toe ulcer ([place]) 11/05/2018   History of kidney stones    passed 1 several years ago   Hyperlipidemia    Hypertension    not on medications   Peripheral vascular disease ([place])    stent in left leg due to anerysm     Past Surgical History:  Procedure Laterality Date    Left Transmetatarsal Ampuation (Left Foot)  08/20/2020   ABDOMINAL AORTOGRAM W/LOWER EXTREMITY N/A 08/06/2020   Procedure: ABDOMINAL AORTOGRAM W/LOWER EXTREMITY;  Surgeon: Marty Heck, MD;  Location: [place] [facility];  Service: Cardiovascular;  Laterality: N/A;   AMPUTATION Right 11/07/2018   Procedure: RIGHT 3RD TOE AMPUTATION;  Surgeon: Newt Minion, MD;  Location: [place];  Service: Orthopedics;  Laterality: Right;   AMPUTATION Left 08/20/2020   Procedure: Left Transmetatarsal Ampuation;  Surgeon: Marty Heck, MD;  Location: [place];  Service: Vascular;  Laterality: Left;    AMPUTATION Left 10/03/2020   Procedure: LEFT BELOW KNEE AMPUTATION;  Surgeon: Marty Heck, MD;  Location: [place];  Service: Vascular;  Laterality: Left;   AMPUTATION TOE Left    big toe and one next to it   CATARACT EXTRACTION Bilateral    EYE SURGERY Bilateral    cataracts removed   INSERTION OF ILIAC STENT Left    PERIPHERAL VASCULAR BALLOON ANGIOPLASTY Left 08/06/2020   Procedure: PERIPHERAL VASCULAR BALLOON ANGIOPLASTY;  Surgeon: Marty Heck, MD;  Location: [place] [facility];  Service: Cardiovascular;  Laterality: Left;  Peroneal artery.   TOE AMPUTATION Left 2015   TOTAL HIP ARTHROPLASTY Left 05/23/2020   Procedure: LEFT TOTAL HIP ARTHROPLASTY ANTERIOR APPROACH;  Surgeon: Mcarthur Rossetti, MD;  [facility];  Service: Orthopedics;  Laterality: Left;   TOTAL HIP ARTHROPLASTY Right 07/31/2021   Procedure: RIGHT TOTAL HIP ARTHROPLASTY ANTERIOR APPROACH;  Surgeon: Mcarthur Rossetti, MD;  [facility];  Service: Orthopedics;  Laterality: Right;   WOUND DEBRIDEMENT Left 09/11/2020   Procedure: DEBRIDEMENT OF LEFT TRANSMETATARSAL WOUND;  Surgeon: Cherre Robins, MD;  Location: [place];  Service: Vascular;  Laterality: Left;    There were no vitals filed for this visit.   Subjective Assessment - 11/03/21 0800     Subjective He cancelled last PT appt because he could not get  prosthesis on limb.  He did not wear shrinker overnight & ate increased salt.  He saw prosthetist yesterday who made note on socket modifications. He should have something to walk on at next appt on Monday, 2/27    Pertinent History Left TTTA, left THA (05/23/20), arthritis, DM2, HTN, HLD, PVD    Patient Stated Goals Wants to use prosthesis to return to some work, get back in community, be mobile, work in gym in his garage    Currently in Pain? No/denies    Pain Score 0-No pain    Pain Location Hip    Pain Orientation Right    Pain Score 2   yesterday 6/10   Pain Location Leg    residual limb   Pain Orientation Left;Anterior    Pain Descriptors / Indicators Sore    Pain Type Chronic pain    Pain Onset More than a month ago    Pain Frequency Intermittent    Aggravating Factors  increased work in yard / activity    Pain Relieving Factors removing prosthesis                               [facility] Adult PT Treatment/Exercise - 11/03/21 0800       Ambulation/Gait   Ambulation/Gait Yes    Ambulation/Gait Assistance 5: Supervision    Ambulation/Gait Assistance Details PT recommended walking in home where distances are shorter without device & with cane depending on left residual limb pain.  Pt verbalized understanding.    Ambulation Distance (Feet) 100 Feet   50' X 2 no device & 100' X 1 cane   Assistive device Prosthesis      Neuro Re-ed    Neuro Re-ed Details  tandem stance on foam beam 30 sec RLE in front & in back with intermittent touch on //bars      Knee/Hip Exercises: Stretches   Active Hamstring Stretch 3 reps;30 seconds;Both    Active Hamstring Stretch Limitations supine with strap      Knee/Hip Exercises: Aerobic   Nustep Seat 12 level 8 with UE/LE X 10 min      Knee/Hip Exercises: Machines for Strengthening   Total Gym Leg Press Shuttle leg press 168# DL 2X15 & RLE only 87# 15 reps 2 sets focus on maximal hip ext,      Knee/Hip Exercises: Standing   Forward Step Up Hand Hold: 2;Both;10 reps   BOSU round side up for unstable surface   Forward Step Up Limitations demo & verbal cues to engage & work rt hip    Step Down Both;1 set;10 reps;Hand Hold: 2   BOSU round side up for unstable surface   Step Down Limitations demo & verbal cues to engage & work rt hip including weight acceptance stepping down onto RLE    SLS with Vectors RLE stance on foam beam square 4 cones reach LLE with BUE support (focus on minimizing WBing)    Other Standing Knee Exercises standing hip extensions  X15 bilat with green band, RLE on foam beam to enable LLE  hip ext (RLE stance) without changing movement pattern.    Other Standing Knee Exercises sidestepping on foam beam with green band around ankles X 5 round trips //bars      Prosthetics   Prosthetic Care Comments  PT advised that residual limb discomfort / pain may take 2-3 weeks to resolve once he gets new socket as his soft tissue has  changes like contusion that will take time to resolve    Education Provided Other (comment)   see prosthetic care   Person(s) Educated Patient    Education Method Explanation;Verbal cues    Education Method Verbalized understanding                       PT Short Term Goals - 09/16/21 0937       PT SHORT TERM GOAL #1   Title He will be I and compliant with HEP    Time 4    Period Weeks    Status Achieved    Target Date 09/15/21      PT SHORT TERM GOAL #2   Title He will be able to ambulate 200 feet with SPC mod I to supervision    Baseline met 1/4    Time 4    Period Weeks    Status Achieved    Target Date 09/15/21      PT SHORT TERM GOAL #3   Title -      PT SHORT TERM GOAL #4   Title -               PT Long Term Goals - 10/13/21 0856       PT LONG TERM GOAL #1   Title He will improve FOTO functional score to 59%    Time 8    Period Weeks    Status On-going    Target Date 11/24/21      PT LONG TERM GOAL #2   Title Patient will improve Rt hip strength to 5/5 MMT all planes for functional activity    Baseline met 1/31    Time 8    Period Weeks    Status Achieved    Target Date 10/13/21      PT LONG TERM GOAL #3   Title Berg Balance >/= 43/56 to indicate lower fall risk    Baseline 40 on 1/31    Time 8    Period Weeks    Status On-going    Target Date 11/24/21      PT LONG TERM GOAL #4   Title Patient ambulates 300' with SPC to no AD    Baseline met 1/31    Time 8    Period Weeks    Status Achieved      PT LONG TERM GOAL #5   Title Patient will improve Rt hip pain to overall less than 3/10 with  ususal activity    Baseline no pain this week in rt hip but is limited with Lt residual limb pain    Time 8    Period Weeks    Status Achieved    Target Date 10/13/21                   Plan - 11/03/21 0800     Clinical Impression Statement Patient's functional strength with greater active range is improving.  His gait is limited by limb pain with current prosthesis but should improve with new socket that he may get next Monday.  His balance is improving but still challenged by activities especially compliant surfaces.  Pt continues to benefit from skilled PT.    Personal Factors and Comorbidities Comorbidity 3+;Fitness;Time since onset of injury/illness/exacerbation;Past/Current Experience    Comorbidities RT THA, Left TTA, left THA (05/23/20), arthritis, DM2, HTN, HLD, PVD    Examination-Activity Limitations Locomotion Level;Squat;Stairs;Stand;Transfers;Lift;Sleep    Examination-Participation Restrictions Community Activity;Occupation;Driving;Shop  Stability/Clinical Decision Making Evolving/Moderate complexity    Rehab Potential Good    PT Frequency 2x / week    PT Duration 8 weeks    PT Treatment/Interventions ADLs/Self Care Home Management;DME Instruction;Gait training;Stair training;Functional mobility training;Therapeutic activities;Therapeutic exercise;Balance training;Neuromuscular re-education;Patient/family education;Prosthetic Training;Vestibular;Dry needling;Manual techniques;Electrical Stimulation;Cryotherapy;Ultrasound;Passive range of motion;Joint Manipulations    PT Next Visit Plan continue gait without device as tolerated with residual limb pain, balance, standing tolerance, functional strength and ROM.    PT Home Exercise Plan squats at counter top with UE support and chair behind him, standing hip flexion and abd with red band, standing hip flexor stretch    Consulted and Agree with Plan of Care Patient             Patient will benefit from skilled  therapeutic intervention in order to improve the following deficits and impairments:  Abnormal gait, Decreased activity tolerance, Decreased balance, Decreased endurance, Decreased knowledge of use of DME, Decreased mobility, Decreased range of motion, Decreased skin integrity, Decreased strength, Increased edema, Postural dysfunction, Prosthetic Dependency, Pain, Impaired flexibility  Visit Diagnosis: Pain in right hip  Other abnormalities of gait and mobility  Unsteadiness on feet  Stiffness of right hip, not elsewhere classified  Muscle weakness (generalized)     Problem List Patient Active Problem List   Diagnosis Date Noted   S/P BKA (below knee amputation) unilateral, left ([place]) 10/03/2020   Non-healing wound of lower extremity 10/03/2020   History of amputation of left foot through metatarsal bone ([place]) 09/11/2020   PAD (peripheral artery disease) ([place]) 08/20/2020   Status post total replacement of left hip 06/05/2020   Unilateral primary osteoarthritis, right hip 04/17/2020   Unilateral primary osteoarthritis, left hip 04/17/2020   Amputated toe, right ([place]) 11/22/2018   Type 2 diabetes mellitus with foot ulcer, without long-term current use of insulin ([place]) 11/22/2018   Mixed hyperlipidemia 11/22/2018   Aneurysm of left popliteal artery ([place]) 11/22/2018   Status post peripheral artery angioplasty with insertion of stent 11/22/2018   Osteomyelitis of third toe of right foot ([place])    Cellulitis of third toe of right foot     Jamey Reas, PT, DPT 11/03/2021, 8:58 AM  [facility] [address] [place], [place], [address] Phone: [phone]   Fax:  [phone]  Name: ULRICK METHOT MRN: [id number] Date of Birth: [birth year]

## 2021-11-05 ENCOUNTER — Other Ambulatory Visit: Payer: Self-pay

## 2021-11-05 ENCOUNTER — Ambulatory Visit (INDEPENDENT_AMBULATORY_CARE_PROVIDER_SITE_OTHER): Payer: BC Managed Care – PPO | Admitting: Physical Therapy

## 2021-11-05 ENCOUNTER — Encounter: Payer: Self-pay | Admitting: Physical Therapy

## 2021-11-05 DIAGNOSIS — R293 Abnormal posture: Secondary | ICD-10-CM

## 2021-11-05 DIAGNOSIS — M25551 Pain in right hip: Secondary | ICD-10-CM | POA: Diagnosis not present

## 2021-11-05 DIAGNOSIS — R2681 Unsteadiness on feet: Secondary | ICD-10-CM

## 2021-11-05 DIAGNOSIS — M25662 Stiffness of left knee, not elsewhere classified: Secondary | ICD-10-CM

## 2021-11-05 DIAGNOSIS — R2689 Other abnormalities of gait and mobility: Secondary | ICD-10-CM | POA: Diagnosis not present

## 2021-11-05 DIAGNOSIS — M25651 Stiffness of right hip, not elsewhere classified: Secondary | ICD-10-CM

## 2021-11-05 DIAGNOSIS — M6281 Muscle weakness (generalized): Secondary | ICD-10-CM

## 2021-11-05 NOTE — Therapy (Signed)
[facility] Therapy [address] [place], [place], [address] Phone: [phone]   Fax:  [phone]  Physical Therapy Treatment  Patient Details  Name: Anthony Park MRN: [id number] Date of Birth: [birth year] Referring Provider (PT): Carney Bern   Encounter Date: 11/05/2021   PT End of Session - 11/05/21 0803     Visit Number 18    Number of Visits 24    Date for PT Re-Evaluation 11/24/21    Authorization Type BCBS comm ppo    Authorization Time Period 2023 Deductible ($1,450.00)$0.00 Paid $1,450.00 to go, Out-of-Pocket Limit ($2,650.00)  $0.00 Paid $2,650.00 to go    Authorization - Visit Number 9    Authorization - Number of Visits 116    PT Start Time 0800    PT Stop Time 0842    PT Time Calculation (min) 42 min    Activity Tolerance Patient tolerated treatment well    Behavior During Therapy [facility] for tasks assessed/performed             Past Medical History:  Diagnosis Date   Arthritis    Diabetes mellitus without complication ([place])    type 2 controlled with diet    Diabetic toe ulcer ([place]) 11/05/2018   History of kidney stones    passed 1 several years ago   Hyperlipidemia    Hypertension    not on medications   Peripheral vascular disease ([place])    stent in left leg due to anerysm     Past Surgical History:  Procedure Laterality Date    Left Transmetatarsal Ampuation (Left Foot)  08/20/2020   ABDOMINAL AORTOGRAM W/LOWER EXTREMITY N/A 08/06/2020   Procedure: ABDOMINAL AORTOGRAM W/LOWER EXTREMITY;  Surgeon: Marty Heck, MD;  Location: [place] [facility];  Service: Cardiovascular;  Laterality: N/A;   AMPUTATION Right 11/07/2018   Procedure: RIGHT 3RD TOE AMPUTATION;  Surgeon: Newt Minion, MD;  Location: [place];  Service: Orthopedics;  Laterality: Right;   AMPUTATION Left 08/20/2020   Procedure: Left Transmetatarsal Ampuation;  Surgeon: Marty Heck, MD;  Location: [place];  Service: Vascular;  Laterality: Left;    AMPUTATION Left 10/03/2020   Procedure: LEFT BELOW KNEE AMPUTATION;  Surgeon: Marty Heck, MD;  Location: [place];  Service: Vascular;  Laterality: Left;   AMPUTATION TOE Left    big toe and one next to it   CATARACT EXTRACTION Bilateral    EYE SURGERY Bilateral    cataracts removed   INSERTION OF ILIAC STENT Left    PERIPHERAL VASCULAR BALLOON ANGIOPLASTY Left 08/06/2020   Procedure: PERIPHERAL VASCULAR BALLOON ANGIOPLASTY;  Surgeon: Marty Heck, MD;  Location: [place] [facility];  Service: Cardiovascular;  Laterality: Left;  Peroneal artery.   TOE AMPUTATION Left 2015   TOTAL HIP ARTHROPLASTY Left 05/23/2020   Procedure: LEFT TOTAL HIP ARTHROPLASTY ANTERIOR APPROACH;  Surgeon: Mcarthur Rossetti, MD;  [facility];  Service: Orthopedics;  Laterality: Left;   TOTAL HIP ARTHROPLASTY Right 07/31/2021   Procedure: RIGHT TOTAL HIP ARTHROPLASTY ANTERIOR APPROACH;  Surgeon: Mcarthur Rossetti, MD;  [facility]: [facility];  Service: Orthopedics;  Laterality: Right;   WOUND DEBRIDEMENT Left 09/11/2020   Procedure: DEBRIDEMENT OF LEFT TRANSMETATARSAL WOUND;  Surgeon: Cherre Robins, MD;  Location: [place];  Service: Vascular;  Laterality: Left;    There were no vitals filed for this visit.   Subjective Assessment - 11/05/21 0800     Subjective His right hip woke him up last night.  It  may be increased activity or laying wrong.  He got up moved around and 15 min later went back to bed & sleep.    Pertinent History Left TTTA, left THA (05/23/20), arthritis, DM2, HTN, HLD, PVD    Patient Stated Goals Wants to use prosthesis to return to some work, get back in community, be mobile, work in gym in his garage    Currently in Pain? Yes    Pain Score 5     Pain Location Hip    Pain Orientation Right    Pain Descriptors / Indicators Sore;Aching    Pain Type Chronic pain    Pain Onset More than a month ago    Pain Frequency Intermittent    Aggravating Factors  activity level     Pain Relieving Factors rest    Effect of Pain on Daily Activities increasing activity level    Multiple Pain Sites Yes    Pain Score 3    Pain Location Leg   residual limb   Pain Orientation Left;Anterior    Pain Descriptors / Indicators Sore    Pain Type Chronic pain    Pain Onset More than a month ago    Pain Frequency Intermittent    Aggravating Factors  increased activity    Pain Relieving Factors removing prosthesis    Effect of Pain on Daily Activities weight bearing on prosthesis                               [facility] Adult PT Treatment/Exercise - 11/05/21 0800       Ambulation/Gait   Ambulation/Gait Yes    Ambulation/Gait Assistance 5: Supervision    Ambulation Distance (Feet) 100 Feet   50' X 2 no device & 100' X 1 cane   Assistive device Prosthesis      Neuro Re-ed    Neuro Re-ed Details  tandem stance on foam beam 30 sec RLE in front & in back with intermittent touch on //bars.  crossways stance feet 2" apart head turns 4 directions intermittent touch.      Knee/Hip Exercises: Stretches   Active Hamstring Stretch 3 reps;30 seconds;Both    Active Hamstring Stretch Limitations supine with strap      Knee/Hip Exercises: Aerobic   Recumbent Bike seat 8 level 3 for 8 min  PT demo & verbal cues on set up & technique with TTA prosthesis    Nustep --      Knee/Hip Exercises: Machines for Strengthening   Total Gym Leg Press Shuttle leg press 168# DL 2X15 & RLE only 87# 15 reps 2 sets focus on maximal hip ext,      Knee/Hip Exercises: Standing   Forward Step Up Hand Hold: 2;10 reps;Right   BOSU round side up for unstable surface   Forward Step Up Limitations demo & verbal cues to engage & work rt hip    Step Down 1 set;10 reps;Hand Hold: 2;Right   BOSU round side up for unstable surface   Step Down Limitations demo & verbal cues to engage & work rt hip including weight acceptance stepping down onto RLE    SLS with Vectors RLE stance on foam beam square  4 cones reach LLE with BUE support (focus on minimizing WBing)    Other Standing Knee Exercises standing hip extensions  X15 bilat full motion from flexion initial contact to full extension terminal stance with green band, RLE on foam beam to  enable LLE hip ext (RLE stance) without changing movement pattern.    Other Standing Knee Exercises sidestepping on foam beam with green band around knees X 5 round trips //bars      Prosthetics   Prosthetic Care Comments  PT advised that residual limb discomfort / pain may take 2-3 weeks to resolve once he gets new socket as his soft tissue has changes like contusion that will take time to resolve    Education Provided Other (comment)   see prosthetic care                      PT Short Term Goals - 09/16/21 0937       PT SHORT TERM GOAL #1   Title He will be I and compliant with HEP    Time 4    Period Weeks    Status Achieved    Target Date 09/15/21      PT SHORT TERM GOAL #2   Title He will be able to ambulate 200 feet with SPC mod I to supervision    Baseline met 1/4    Time 4    Period Weeks    Status Achieved    Target Date 09/15/21      PT SHORT TERM GOAL #3   Title -      PT SHORT TERM GOAL #4   Title -               PT Long Term Goals - 10/13/21 0856       PT LONG TERM GOAL #1   Title He will improve FOTO functional score to 59%    Time 8    Period Weeks    Status On-going    Target Date 11/24/21      PT LONG TERM GOAL #2   Title Patient will improve Rt hip strength to 5/5 MMT all planes for functional activity    Baseline met 1/31    Time 8    Period Weeks    Status Achieved    Target Date 10/13/21      PT LONG TERM GOAL #3   Title Berg Balance >/= 43/56 to indicate lower fall risk    Baseline 40 on 1/31    Time 8    Period Weeks    Status On-going    Target Date 11/24/21      PT LONG TERM GOAL #4   Title Patient ambulates 300' with SPC to no AD    Baseline met 1/31    Time 8    Period  Weeks    Status Achieved      PT LONG TERM GOAL #5   Title Patient will improve Rt hip pain to overall less than 3/10 with ususal activity    Baseline no pain this week in rt hip but is limited with Lt residual limb pain    Time 8    Period Weeks    Status Achieved    Target Date 10/13/21                   Plan - 11/05/21 0806     Clinical Impression Statement Patient increased his activity level yesterday which made his right hip sore and limited PT activities today.  He is challenged by compliant surfaces but is improving balance reactions.  His functional strength is improving but muscle endurance limits how much he can do.    Personal Factors and Comorbidities Comorbidity  3+;Fitness;Time since onset of injury/illness/exacerbation;Past/Current Experience    Comorbidities RT THA, Left TTA, left THA (05/23/20), arthritis, DM2, HTN, HLD, PVD    Examination-Activity Limitations Locomotion Level;Squat;Stairs;Stand;Transfers;Lift;Sleep    Examination-Participation Restrictions Community Activity;Occupation;Driving;Shop    Stability/Clinical Decision Making Evolving/Moderate complexity    Rehab Potential Good    PT Frequency 2x / week    PT Duration 8 weeks    PT Treatment/Interventions ADLs/Self Care Home Management;DME Instruction;Gait training;Stair training;Functional mobility training;Therapeutic activities;Therapeutic exercise;Balance training;Neuromuscular re-education;Patient/family education;Prosthetic Training;Vestibular;Dry needling;Manual techniques;Electrical Stimulation;Cryotherapy;Ultrasound;Passive range of motion;Joint Manipulations    PT Next Visit Plan upgrade standing resistance to blue theraband, continue gait without device as tolerated with residual limb pain, balance, standing tolerance, functional strength and ROM.    PT Home Exercise Plan squats at counter top with UE support and chair behind him, standing hip flexion and abd with red band, standing hip flexor  stretch    Consulted and Agree with Plan of Care Patient             Patient will benefit from skilled therapeutic intervention in order to improve the following deficits and impairments:  Abnormal gait, Decreased activity tolerance, Decreased balance, Decreased endurance, Decreased knowledge of use of DME, Decreased mobility, Decreased range of motion, Decreased skin integrity, Decreased strength, Increased edema, Postural dysfunction, Prosthetic Dependency, Pain, Impaired flexibility  Visit Diagnosis: Pain in right hip  Other abnormalities of gait and mobility  Unsteadiness on feet  Stiffness of right hip, not elsewhere classified  Muscle weakness (generalized)  Stiffness of left knee, not elsewhere classified  Abnormal posture     Problem List Patient Active Problem List   Diagnosis Date Noted   S/P BKA (below knee amputation) unilateral, left ([place]) 10/03/2020   Non-healing wound of lower extremity 10/03/2020   History of amputation of left foot through metatarsal bone ([place]) 09/11/2020   PAD (peripheral artery disease) ([place]) 08/20/2020   Status post total replacement of left hip 06/05/2020   Unilateral primary osteoarthritis, right hip 04/17/2020   Unilateral primary osteoarthritis, left hip 04/17/2020   Amputated toe, right ([place]) 11/22/2018   Type 2 diabetes mellitus with foot ulcer, without long-term current use of insulin ([place]) 11/22/2018   Mixed hyperlipidemia 11/22/2018   Aneurysm of left popliteal artery ([place]) 11/22/2018   Status post peripheral artery angioplasty with insertion of stent 11/22/2018   Osteomyelitis of third toe of right foot ([place])    Cellulitis of third toe of right foot     Jamey Reas, PT, DPT 11/05/2021, 9:24 AM  [facility] [address] [place], [place], [address] Phone: [phone]   Fax:  [phone]  Name: ALOK MINSHALL MRN: [id number] Date of Birth: [birth year]

## 2021-11-10 ENCOUNTER — Encounter: Payer: Self-pay | Admitting: Physical Therapy

## 2021-11-10 ENCOUNTER — Other Ambulatory Visit: Payer: Self-pay

## 2021-11-10 ENCOUNTER — Ambulatory Visit (INDEPENDENT_AMBULATORY_CARE_PROVIDER_SITE_OTHER): Payer: BC Managed Care – PPO | Admitting: Physical Therapy

## 2021-11-10 DIAGNOSIS — R2681 Unsteadiness on feet: Secondary | ICD-10-CM | POA: Diagnosis not present

## 2021-11-10 DIAGNOSIS — M25551 Pain in right hip: Secondary | ICD-10-CM

## 2021-11-10 DIAGNOSIS — M6281 Muscle weakness (generalized): Secondary | ICD-10-CM

## 2021-11-10 DIAGNOSIS — M25651 Stiffness of right hip, not elsewhere classified: Secondary | ICD-10-CM

## 2021-11-10 DIAGNOSIS — R2689 Other abnormalities of gait and mobility: Secondary | ICD-10-CM | POA: Diagnosis not present

## 2021-11-10 NOTE — Therapy (Signed)
[facility] Physical Therapy [address] [place], [place], [address] Phone: [phone]   Fax:  [phone]  Physical Therapy Treatment  Patient Details  Name: LEMARCUS Park MRN: [id number] Date of Birth: 11/17/1960 Referring Provider (PT): Carney Bern   Encounter Date: 11/10/2021   PT End of Session - 11/10/21 0805     Visit Number 19    Number of Visits 24    Date for PT Re-Evaluation 11/24/21    Authorization Type BCBS comm ppo    Authorization Time Period 2023 Deductible ($1,450.00)$0.00 Paid $1,450.00 to go, Out-of-Pocket Limit ($2,650.00)  $0.00 Paid $2,650.00 to go    Authorization - Visit Number 10    Authorization - Number of Visits 116    PT Start Time 0800    PT Stop Time 0844    PT Time Calculation (min) 44 min    Activity Tolerance Patient tolerated treatment well    Behavior During Therapy [facility] for tasks assessed/performed             Past Medical History:  Diagnosis Date   Arthritis    Diabetes mellitus without complication ([place])    type 2 controlled with diet    Diabetic toe ulcer ([place]) 11/05/2018   History of kidney stones    passed 1 several years ago   Hyperlipidemia    Hypertension    not on medications   Peripheral vascular disease ([place])    stent in left leg due to anerysm     Past Surgical History:  Procedure Laterality Date    Left Transmetatarsal Ampuation (Left Foot)  08/20/2020   ABDOMINAL AORTOGRAM W/LOWER EXTREMITY N/A 08/06/2020   Procedure: ABDOMINAL AORTOGRAM W/LOWER EXTREMITY;  Surgeon: Marty Heck, MD;  Location: [place] [facility];  Service: Cardiovascular;  Laterality: N/A;   AMPUTATION Right 11/07/2018   Procedure: RIGHT 3RD TOE AMPUTATION;  Surgeon: Newt Minion, MD;  Location: [place];  Service: Orthopedics;  Laterality: Right;   AMPUTATION Left 08/20/2020   Procedure: Left Transmetatarsal Ampuation;  Surgeon: Marty Heck, MD;  Location: [place];  Service: Vascular;  Laterality: Left;    AMPUTATION Left 10/03/2020   Procedure: LEFT BELOW KNEE AMPUTATION;  Surgeon: Marty Heck, MD;  Location: [place];  Service: Vascular;  Laterality: Left;   AMPUTATION TOE Left    big toe and one next to it   CATARACT EXTRACTION Bilateral    EYE SURGERY Bilateral    cataracts removed   INSERTION OF ILIAC STENT Left    PERIPHERAL VASCULAR BALLOON ANGIOPLASTY Left 08/06/2020   Procedure: PERIPHERAL VASCULAR BALLOON ANGIOPLASTY;  Surgeon: Marty Heck, MD;  Location: [place] [facility];  Service: Cardiovascular;  Laterality: Left;  Peroneal artery.   TOE AMPUTATION Left 2015   TOTAL HIP ARTHROPLASTY Left 05/23/2020   Procedure: LEFT TOTAL HIP ARTHROPLASTY ANTERIOR APPROACH;  Surgeon: Mcarthur Rossetti, MD;  [facility];  Service: Orthopedics;  Laterality: Left;   TOTAL HIP ARTHROPLASTY Right 07/31/2021   Procedure: RIGHT TOTAL HIP ARTHROPLASTY ANTERIOR APPROACH;  Surgeon: Mcarthur Rossetti, MD;  [facility];  Service: Orthopedics;  Laterality: Right;   WOUND DEBRIDEMENT Left 09/11/2020   Procedure: DEBRIDEMENT OF LEFT TRANSMETATARSAL WOUND;  Surgeon: Cherre Robins, MD;  Location: [place];  Service: Vascular;  Laterality: Left;    There were no vitals filed for this visit.   Subjective Assessment - 11/10/21 0800     Subjective He had check socket & alignment with new prosthesis yesterday  and anticipates delivery next Monday.  He liked the way it felt.    Pertinent History Left TTTA, left THA (05/23/20), arthritis, DM2, HTN, HLD, PVD    Patient Stated Goals Wants to use prosthesis to return to some work, get back in community, be mobile, work in gym in his garage    Currently in Pain? No/denies    Pain Score 0-No pain    Pain Location Hip    Pain Orientation Right    Pain Onset More than a month ago    Pain Score 2   last night 2/10 this morning 0/10   Pain Location Leg   residual limb   Pain Orientation Left;Anterior    Pain Descriptors /  Indicators Sore    Pain Type Chronic pain    Pain Onset More than a month ago    Pain Frequency Intermittent    Aggravating Factors  increased activity with current prosthesis    Pain Relieving Factors removing prosthesis                               [facility] Adult PT Treatment/Exercise - 11/10/21 0800       Ambulation/Gait   Ambulation/Gait Yes    Ambulation/Gait Assistance 5: Supervision    Ambulation Distance (Feet) 100 Feet   50' X 2 no device & 100' X 1 cane   Assistive device Prosthesis      Neuro Re-ed    Neuro Re-ed Details  tandem stance on foam beam 30 sec RLE in front & in back with intermittent touch on //bars.  crossways stance feet 2" apart head turns 4 directions intermittent touch.      Knee/Hip Exercises: Stretches   Active Hamstring Stretch 3 reps;30 seconds;Both    Active Hamstring Stretch Limitations supine with strap      Knee/Hip Exercises: Aerobic   Nustep Seat 12 level 8 with UE/LE X 10 min      Knee/Hip Exercises: Machines for Strengthening   Total Gym Leg Press Shuttle leg press 168# DL 2X15 & RLE only 87# 15 reps 2 sets focus on maximal hip ext,      Knee/Hip Exercises: Standing   Forward Step Up Hand Hold: 2;10 reps;Right   BOSU round side up for unstable surface   Forward Step Up Limitations verbal cues to engage & work rt hip    Step Down 1 set;10 reps;Hand Hold: 2;Right   BOSU round side up for unstable surface   Step Down Limitations verbal cues to engage & work rt hip including weight acceptance stepping down onto RLE    SLS with Vectors RLE stance on foam with LLE tapping 3 cones: lateral, ant, add / across midline with light UE support 10 reps.    Other Standing Knee Exercises standing hip extensions  X15 bilat full motion from flexion initial contact to full extension terminal stance with blue band, RLE on foam beam to enable LLE hip ext (RLE stance) without changing movement pattern.    Other Standing Knee Exercises  sidestepping on foam beam with blue band around knees X 5 round trips //bars      Prosthetics   Prosthetic Care Comments  PT advised that residual limb discomfort / pain may take 2-3 weeks to resolve once he gets new socket as his soft tissue has changes like contusion that will take time to resolve    Education Provided Other (comment)   see prosthetic  care                      PT Short Term Goals - 09/16/21 0937       PT SHORT TERM GOAL #1   Title He will be I and compliant with HEP    Time 4    Period Weeks    Status Achieved    Target Date 09/15/21      PT SHORT TERM GOAL #2   Title He will be able to ambulate 200 feet with SPC mod I to supervision    Baseline met 1/4    Time 4    Period Weeks    Status Achieved    Target Date 09/15/21      PT SHORT TERM GOAL #3   Title -      PT SHORT TERM GOAL #4   Title -               PT Long Term Goals - 10/13/21 0856       PT LONG TERM GOAL #1   Title He will improve FOTO functional score to 59%    Time 8    Period Weeks    Status On-going    Target Date 11/24/21      PT LONG TERM GOAL #2   Title Patient will improve Rt hip strength to 5/5 MMT all planes for functional activity    Baseline met 1/31    Time 8    Period Weeks    Status Achieved    Target Date 10/13/21      PT LONG TERM GOAL #3   Title Berg Balance >/= 43/56 to indicate lower fall risk    Baseline 40 on 1/31    Time 8    Period Weeks    Status On-going    Target Date 11/24/21      PT LONG TERM GOAL #4   Title Patient ambulates 300' with SPC to no AD    Baseline met 1/31    Time 8    Period Weeks    Status Achieved      PT LONG TERM GOAL #5   Title Patient will improve Rt hip pain to overall less than 3/10 with ususal activity    Baseline no pain this week in rt hip but is limited with Lt residual limb pain    Time 8    Period Weeks    Status Achieved    Target Date 10/13/21                   Plan - 11/10/21  0808     Clinical Impression Statement Patient's right hip strength & function appear to be improving.  He is scheduled to get new prosthesis next Monday which should further improve his moblity.    Personal Factors and Comorbidities Comorbidity 3+;Fitness;Time since onset of injury/illness/exacerbation;Past/Current Experience    Comorbidities RT THA, Left TTA, left THA (05/23/20), arthritis, DM2, HTN, HLD, PVD    Examination-Activity Limitations Locomotion Level;Squat;Stairs;Stand;Transfers;Lift;Sleep    Examination-Participation Restrictions Community Activity;Occupation;Driving;Shop    Stability/Clinical Decision Making Evolving/Moderate complexity    Rehab Potential Good    PT Frequency 2x / week    PT Duration 8 weeks    PT Treatment/Interventions ADLs/Self Care Home Management;DME Instruction;Gait training;Stair training;Functional mobility training;Therapeutic activities;Therapeutic exercise;Balance training;Neuromuscular re-education;Patient/family education;Prosthetic Training;Vestibular;Dry needling;Manual techniques;Electrical Stimulation;Cryotherapy;Ultrasound;Passive range of motion;Joint Manipulations    PT Next Visit Plan continue with stance blue theraband, continue  gait without device as tolerated with residual limb pain, balance, standing tolerance, functional strength and ROM.    PT Home Exercise Plan squats at counter top with UE support and chair behind him, standing hip flexion and abd with red band, standing hip flexor stretch    Consulted and Agree with Plan of Care Patient             Patient will benefit from skilled therapeutic intervention in order to improve the following deficits and impairments:  Abnormal gait, Decreased activity tolerance, Decreased balance, Decreased endurance, Decreased knowledge of use of DME, Decreased mobility, Decreased range of motion, Decreased skin integrity, Decreased strength, Increased edema, Postural dysfunction, Prosthetic  Dependency, Pain, Impaired flexibility  Visit Diagnosis: Pain in right hip  Other abnormalities of gait and mobility  Unsteadiness on feet  Stiffness of right hip, not elsewhere classified  Muscle weakness (generalized)     Problem List Patient Active Problem List   Diagnosis Date Noted   S/P BKA (below knee amputation) unilateral, left ([place]) 10/03/2020   Non-healing wound of lower extremity 10/03/2020   History of amputation of left foot through metatarsal bone ([place]) 09/11/2020   PAD (peripheral artery disease) ([place]) 08/20/2020   Status post total replacement of left hip 06/05/2020   Unilateral primary osteoarthritis, right hip 04/17/2020   Unilateral primary osteoarthritis, left hip 04/17/2020   Amputated toe, right ([place]) 11/22/2018   Type 2 diabetes mellitus with foot ulcer, without long-term current use of insulin ([place]) 11/22/2018   Mixed hyperlipidemia 11/22/2018   Aneurysm of left popliteal artery ([place]) 11/22/2018   Status post peripheral artery angioplasty with insertion of stent 11/22/2018   Osteomyelitis of third toe of right foot ([facility])    Cellulitis of third toe of right foot     Jamey Reas, PT, DPT 11/10/2021, 12:58 PM  [facility] [address] [place], [place], [address] Phone: [phone]   Fax:  [phone]  Name: Anthony Park MRN: [id number] Date of Birth: 1961/04/29

## 2021-11-12 ENCOUNTER — Ambulatory Visit (INDEPENDENT_AMBULATORY_CARE_PROVIDER_SITE_OTHER): Payer: BC Managed Care – PPO | Admitting: Physical Therapy

## 2021-11-12 ENCOUNTER — Other Ambulatory Visit: Payer: Self-pay

## 2021-11-12 ENCOUNTER — Encounter: Payer: Self-pay | Admitting: Physical Therapy

## 2021-11-12 DIAGNOSIS — M6281 Muscle weakness (generalized): Secondary | ICD-10-CM

## 2021-11-12 DIAGNOSIS — R2689 Other abnormalities of gait and mobility: Secondary | ICD-10-CM | POA: Diagnosis not present

## 2021-11-12 DIAGNOSIS — M25651 Stiffness of right hip, not elsewhere classified: Secondary | ICD-10-CM | POA: Diagnosis not present

## 2021-11-12 DIAGNOSIS — M25551 Pain in right hip: Secondary | ICD-10-CM

## 2021-11-12 DIAGNOSIS — R2681 Unsteadiness on feet: Secondary | ICD-10-CM | POA: Diagnosis not present

## 2021-11-12 DIAGNOSIS — M79662 Pain in left lower leg: Secondary | ICD-10-CM

## 2021-11-12 NOTE — Therapy (Signed)
[facility] Physical Therapy [address] [place], [place], [address] Phone: [phone]   Fax:  [phone]  Physical Therapy Treatment  Patient Details  Name: Anthony Park MRN: [id number] Date of Birth: 1961/07/26 Referring Provider (PT): Carney Bern   Encounter Date: 11/12/2021   PT End of Session - 11/12/21 0801     Visit Number 20    Number of Visits 24    Date for PT Re-Evaluation 11/24/21    Authorization Type [employer] comm ppo    Authorization Time Period 2023 Deductible ($1,450.00)$0.00 Paid $1,450.00 to go, Out-of-Pocket Limit ($2,650.00)  $0.00 Paid $2,650.00 to go    Authorization - Visit Number 11    Authorization - Number of Visits 116    PT Start Time 0800    PT Stop Time 0838    PT Time Calculation (min) 38 min    Activity Tolerance Patient tolerated treatment well    Behavior During Therapy [facility] for tasks assessed/performed             Past Medical History:  Diagnosis Date   Arthritis    Diabetes mellitus without complication ([place])    type 2 controlled with diet    Diabetic toe ulcer ([place]) 11/05/2018   History of kidney stones    passed 1 several years ago   Hyperlipidemia    Hypertension    not on medications   Peripheral vascular disease ([place])    stent in left leg due to anerysm     Past Surgical History:  Procedure Laterality Date    Left Transmetatarsal Ampuation (Left Foot)  08/20/2020   ABDOMINAL AORTOGRAM W/LOWER EXTREMITY N/A 08/06/2020   Procedure: ABDOMINAL AORTOGRAM W/LOWER EXTREMITY;  Surgeon: Marty Heck, MD;  Location: [place] [facility];  Service: Cardiovascular;  Laterality: N/A;   AMPUTATION Right 11/07/2018   Procedure: RIGHT 3RD TOE AMPUTATION;  Surgeon: Newt Minion, MD;  Location: [place];  Service: Orthopedics;  Laterality: Right;   AMPUTATION Left 08/20/2020   Procedure: Left Transmetatarsal Ampuation;  Surgeon: Marty Heck, MD;  Location: [place];  Service: Vascular;  Laterality: Left;    AMPUTATION Left 10/03/2020   Procedure: LEFT BELOW KNEE AMPUTATION;  Surgeon: Marty Heck, MD;  Location: [place];  Service: Vascular;  Laterality: Left;   AMPUTATION TOE Left    big toe and one next to it   CATARACT EXTRACTION Bilateral    EYE SURGERY Bilateral    cataracts removed   INSERTION OF ILIAC STENT Left    PERIPHERAL VASCULAR BALLOON ANGIOPLASTY Left 08/06/2020   Procedure: PERIPHERAL VASCULAR BALLOON ANGIOPLASTY;  Surgeon: Marty Heck, MD;  Location: [place] [facility];  Service: Cardiovascular;  Laterality: Left;  Peroneal artery.   TOE AMPUTATION Left 2015   TOTAL HIP ARTHROPLASTY Left 05/23/2020   Procedure: LEFT TOTAL HIP ARTHROPLASTY ANTERIOR APPROACH;  Surgeon: Mcarthur Rossetti, MD;  Location: [facility];  Service: Orthopedics;  Laterality: Left;   TOTAL HIP ARTHROPLASTY Right 07/31/2021   Procedure: RIGHT TOTAL HIP ARTHROPLASTY ANTERIOR APPROACH;  Surgeon: Mcarthur Rossetti, MD;  Location: [facility];  Service: Orthopedics;  Laterality: Right;   WOUND DEBRIDEMENT Left 09/11/2020   Procedure: DEBRIDEMENT OF LEFT TRANSMETATARSAL WOUND;  Surgeon: Cherre Robins, MD;  Location: [place];  Service: Vascular;  Laterality: Left;    There were no vitals filed for this visit.   Subjective Assessment - 11/12/21 0800     Subjective He did a lot after PT on Tuesday. His left  residual limb was very sore Wednesday & this morning limiting his weight bearing on prosthesis.    Pertinent History Left TTTA, left THA (05/23/20), arthritis, DM2, HTN, HLD, PVD    Patient Stated Goals Wants to use prosthesis to return to some work, get back in community, be mobile, work in gym in his garage    Currently in Pain? Yes    Pain Score 2    ~2-3am right hip pain woke him up 5-6/10   Pain Location Hip    Pain Orientation Right    Pain Descriptors / Indicators Aching;Sore    Pain Type Chronic pain    Pain Onset More than a month ago    Pain Frequency Intermittent     Aggravating Factors  activity level    Pain Relieving Factors rest & meds    Pain Score 5   in last 2 days, highest 8-9/10   Pain Location Leg   residual limb   Pain Orientation Left;Anterior    Pain Descriptors / Indicators Sore    Pain Type Chronic pain    Pain Onset More than a month ago    Pain Frequency Intermittent    Aggravating Factors  Increased activity with current prosthesis    Pain Relieving Factors removing prosthesis                               [facility] Adult PT Treatment/Exercise - 11/12/21 0800       Ambulation/Gait   Ambulation/Gait Yes    Ambulation/Gait Assistance Details used rollator walker & prosthesis today due to limb pain.    Ambulation Distance (Feet) 100 Feet      Neuro Re-ed    Neuro Re-ed Details  tandem stance on foam beam 30 sec RLE in front & in back with intermittent touch on //bars.  crossways stance feet 2" apart head turns 4 directions intermittent touch.      Knee/Hip Exercises: Stretches   Active Hamstring Stretch 3 reps;30 seconds;Both    Active Hamstring Stretch Limitations supine with strap      Knee/Hip Exercises: Aerobic   Nustep Seat 12 level 8 with UE/LE X 10 min      Knee/Hip Exercises: Machines for Strengthening   Total Gym Leg Press RLE only 87# 15 reps 2 sets focus on maximal hip ext, Pt could not tolerate LLE use today.      Knee/Hip Exercises: Standing   Forward Step Up Hand Hold: 2;10 reps;Right   BOSU round side up for unstable surface   Forward Step Up Limitations verbal cues to engage & work rt hip    Step Down 1 set;10 reps;Hand Hold: 2;Right   BOSU round side up for unstable surface   Step Down Limitations verbal cues to engage & work rt hip including weight acceptance stepping down onto RLE    SLS with Vectors RLE stance on foam with LLE tapping 3 cones: lateral, ant, add / across midline with light UE support 10 reps progressively increasing distance.    Other Standing Knee Exercises standing hip  extensions  X15 LLE for stance RLE full motion from flexion initial contact to full extension terminal stance with blue band, RLE on foam beam to enable LLE hip ext (RLE stance) without changing movement pattern and too painful LLE so switched to standing LLE abduction / stance RLE 15 reps blue theraband and kneeling LLE on chair with pillow to limit WBing LLE for  RLE hip flexionn / extension blue theraband 15 reps.    Other Standing Knee Exercises sidestepping on foam beam with blue band around knees X 1 round trips //bars but too painful LLE so switched to standing LLE abduction / stance RLE 15 reps blue theraband and kneeling LLE on chair with pillow to limit WBing LLE for RLE hip abduction blue theraband 15 reps.      Prosthetics   Prosthetic Care Comments  PT strongly advised that he limit his activities in current prosthesis as it will significantly limit what he can tolerate in new prosthesis on Monday.  Also he needs to plan with new prosthesis to limit activities with slow progression over 2-3 weeks to set long term outcomes.    Residual limb condition  slight edema at distal tibia with tenderness.But no skin breakdown or redness.    Education Provided Other (comment)   see prosthetic care   Person(s) Educated Patient    Education Method Explanation;Verbal cues    Education Method Verbalized understanding                       PT Short Term Goals - 09/16/21 0937       PT SHORT TERM GOAL #1   Title He will be I and compliant with HEP    Time 4    Period Weeks    Status Achieved    Target Date 09/15/21      PT SHORT TERM GOAL #2   Title He will be able to ambulate 200 feet with SPC mod I to supervision    Baseline met 1/4    Time 4    Period Weeks    Status Achieved    Target Date 09/15/21      PT SHORT TERM GOAL #3   Title -      PT SHORT TERM GOAL #4   Title -               PT Long Term Goals - 10/13/21 0856       PT LONG TERM GOAL #1   Title He  will improve FOTO functional score to 59%    Time 8    Period Weeks    Status On-going    Target Date 11/24/21      PT LONG TERM GOAL #2   Title Patient will improve Rt hip strength to 5/5 MMT all planes for functional activity    Baseline met 1/31    Time 8    Period Weeks    Status Achieved    Target Date 10/13/21      PT LONG TERM GOAL #3   Title Berg Balance >/= 43/56 to indicate lower fall risk    Baseline 40 on 1/31    Time 8    Period Weeks    Status On-going    Target Date 11/24/21      PT LONG TERM GOAL #4   Title Patient ambulates 300' with SPC to no AD    Baseline met 1/31    Time 8    Period Weeks    Status Achieved      PT LONG TERM GOAL #5   Title Patient will improve Rt hip pain to overall less than 3/10 with ususal activity    Baseline no pain this week in rt hip but is limited with Lt residual limb pain    Time 8    Period Weeks  Status Achieved    Target Date 10/13/21                   Plan - 11/12/21 0807     Clinical Impression Statement His residual limb pain was limiting standing today.  He appears to understand PT recommendations to limit activity so limb is not overly sore with final fitting of new prosthesis on Monday.  PT modified strengthening exercises to limit weight bearing with prosthesis which helped.  PT thinks that his right hip is sore also as he has been moving with different pattern due to residual limb pain.    Personal Factors and Comorbidities Comorbidity 3+;Fitness;Time since onset of injury/illness/exacerbation;Past/Current Experience    Comorbidities RT THA, Left TTA, left THA (05/23/20), arthritis, DM2, HTN, HLD, PVD    Examination-Activity Limitations Locomotion Level;Squat;Stairs;Stand;Transfers;Lift;Sleep    Examination-Participation Restrictions Community Activity;Occupation;Driving;Shop    Stability/Clinical Decision Making Evolving/Moderate complexity    Rehab Potential Good    PT Frequency 2x / week    PT  Duration 8 weeks    PT Treatment/Interventions ADLs/Self Care Home Management;DME Instruction;Gait training;Stair training;Functional mobility training;Therapeutic activities;Therapeutic exercise;Balance training;Neuromuscular re-education;Patient/family education;Prosthetic Training;Vestibular;Dry needling;Manual techniques;Electrical Stimulation;Cryotherapy;Ultrasound;Passive range of motion;Joint Manipulations    PT Next Visit Plan assess gait & balance with new prosthesis, continue with stance blue theraband, continue gait without device as tolerated with residual limb pain, balance, standing tolerance, functional strength and ROM.    PT Home Exercise Plan squats at counter top with UE support and chair behind him, standing hip flexion and abd with red band, standing hip flexor stretch    Consulted and Agree with Plan of Care Patient             Patient will benefit from skilled therapeutic intervention in order to improve the following deficits and impairments:  Abnormal gait, Decreased activity tolerance, Decreased balance, Decreased endurance, Decreased knowledge of use of DME, Decreased mobility, Decreased range of motion, Decreased skin integrity, Decreased strength, Increased edema, Postural dysfunction, Prosthetic Dependency, Pain, Impaired flexibility  Visit Diagnosis: Pain in right hip  Other abnormalities of gait and mobility  Unsteadiness on feet  Stiffness of right hip, not elsewhere classified  Muscle weakness (generalized)  Pain in left lower leg     Problem List Patient Active Problem List   Diagnosis Date Noted   S/P BKA (below knee amputation) unilateral, left ([place]) 10/03/2020   Non-healing wound of lower extremity 10/03/2020   History of amputation of left foot through metatarsal bone ([place]) 09/11/2020   PAD (peripheral artery disease) ([place]) 08/20/2020   Status post total replacement of left hip 06/05/2020   Unilateral primary osteoarthritis, right hip  04/17/2020   Unilateral primary osteoarthritis, left hip 04/17/2020   Amputated toe, right ([place]) 11/22/2018   Type 2 diabetes mellitus with foot ulcer, without long-term current use of insulin ([place]) 11/22/2018   Mixed hyperlipidemia 11/22/2018   Aneurysm of left popliteal artery ([place]) 11/22/2018   Status post peripheral artery angioplasty with insertion of stent 11/22/2018   Osteomyelitis of third toe of right foot ([facility])    Cellulitis of third toe of right foot     Jamey Reas, PT, DPT 11/12/2021, 8:41 AM  [facility] [address] [place], [place], [address] Phone: [phone]   Fax:  [phone]  Name: Anthony Park MRN: [id number] Date of Birth: [birth year]

## 2021-11-16 DIAGNOSIS — Z89512 Acquired absence of left leg below knee: Secondary | ICD-10-CM | POA: Diagnosis not present

## 2021-11-17 ENCOUNTER — Ambulatory Visit (INDEPENDENT_AMBULATORY_CARE_PROVIDER_SITE_OTHER): Payer: BC Managed Care – PPO | Admitting: Physical Therapy

## 2021-11-17 ENCOUNTER — Encounter: Payer: Self-pay | Admitting: Physical Therapy

## 2021-11-17 ENCOUNTER — Other Ambulatory Visit: Payer: Self-pay

## 2021-11-17 DIAGNOSIS — R2681 Unsteadiness on feet: Secondary | ICD-10-CM | POA: Diagnosis not present

## 2021-11-17 DIAGNOSIS — M25551 Pain in right hip: Secondary | ICD-10-CM | POA: Diagnosis not present

## 2021-11-17 DIAGNOSIS — M25651 Stiffness of right hip, not elsewhere classified: Secondary | ICD-10-CM

## 2021-11-17 DIAGNOSIS — R2689 Other abnormalities of gait and mobility: Secondary | ICD-10-CM | POA: Diagnosis not present

## 2021-11-17 DIAGNOSIS — M79662 Pain in left lower leg: Secondary | ICD-10-CM

## 2021-11-17 DIAGNOSIS — M6281 Muscle weakness (generalized): Secondary | ICD-10-CM

## 2021-11-17 NOTE — Therapy (Signed)
[facility] Physical Therapy [address] [place], [place], [address] Phone: [phone]   Fax:  [phone]  Physical Therapy Treatment  Patient Details  Name: Anthony Park MRN: [id number] Date of Birth: 1960/10/28 Referring Provider (PT): Carney Bern   Encounter Date: 11/17/2021   PT End of Session - 11/17/21 0801     Visit Number 21    Number of Visits 24    Date for PT Re-Evaluation 11/24/21    Authorization Type BCBS comm ppo    Authorization Time Period 2023 Deductible ($1,450.00)$0.00 Paid $1,450.00 to go, Out-of-Pocket Limit ($2,650.00)  $0.00 Paid $2,650.00 to go    Authorization - Visit Number 12    Authorization - Number of Visits 116    PT Start Time 0800    PT Stop Time 0845    PT Time Calculation (min) 45 min    Activity Tolerance Patient tolerated treatment well    Behavior During Therapy WFL for tasks assessed/performed             Past Medical History:  Diagnosis Date   Arthritis    Diabetes mellitus without complication ([place])    type 2 controlled with diet    Diabetic toe ulcer ([place]) 11/05/2018   History of kidney stones    passed 1 several years ago   Hyperlipidemia    Hypertension    not on medications   Peripheral vascular disease ([place])    stent in left leg due to anerysm     Past Surgical History:  Procedure Laterality Date    Left Transmetatarsal Ampuation (Left Foot)  08/20/2020   ABDOMINAL AORTOGRAM W/LOWER EXTREMITY N/A 08/06/2020   Procedure: ABDOMINAL AORTOGRAM W/LOWER EXTREMITY;  Surgeon: Marty Heck, MD;  Location: [place] [facility];  Service: Cardiovascular;  Laterality: N/A;   AMPUTATION Right 11/07/2018   Procedure: RIGHT 3RD TOE AMPUTATION;  Surgeon: Newt Minion, MD;  Location: [place];  Service: Orthopedics;  Laterality: Right;   AMPUTATION Left 08/20/2020   Procedure: Left Transmetatarsal Ampuation;  Surgeon: Marty Heck, MD;  Location: [place];  Service: Vascular;  Laterality: Left;    AMPUTATION Left 10/03/2020   Procedure: LEFT BELOW KNEE AMPUTATION;  Surgeon: Marty Heck, MD;  Location: [place];  Service: Vascular;  Laterality: Left;   AMPUTATION TOE Left    big toe and one next to it   CATARACT EXTRACTION Bilateral    EYE SURGERY Bilateral    cataracts removed   INSERTION OF ILIAC STENT Left    PERIPHERAL VASCULAR BALLOON ANGIOPLASTY Left 08/06/2020   Procedure: PERIPHERAL VASCULAR BALLOON ANGIOPLASTY;  Surgeon: Marty Heck, MD;  Location: [place] [facility];  Service: Cardiovascular;  Laterality: Left;  Peroneal artery.   TOE AMPUTATION Left 2015   TOTAL HIP ARTHROPLASTY Left 05/23/2020   Procedure: LEFT TOTAL HIP ARTHROPLASTY ANTERIOR APPROACH;  Surgeon: Mcarthur Rossetti, MD;  [facility];  Service: Orthopedics;  Laterality: Left;   TOTAL HIP ARTHROPLASTY Right 07/31/2021   Procedure: RIGHT TOTAL HIP ARTHROPLASTY ANTERIOR APPROACH;  Surgeon: Mcarthur Rossetti, MD;  [facility]: [facility];  Service: Orthopedics;  Laterality: Right;   WOUND DEBRIDEMENT Left 09/11/2020   Procedure: DEBRIDEMENT OF LEFT TRANSMETATARSAL WOUND;  Surgeon: Cherre Robins, MD;  Location: [place];  Service: Vascular;  Laterality: Left;    There were no vitals filed for this visit.   Subjective Assessment - 11/17/21 0800     Subjective He got new prosthesis yesterday.  He wore it all  day. Limb pain was improved.  He will need to get used to material in back of knee.    Pertinent History Left TTTA, left THA (05/23/20), arthritis, DM2, HTN, HLD, PVD    Patient Stated Goals Wants to use prosthesis to return to some work, get back in community, be mobile, work in gym in his garage    Currently in Pain? No/denies    Pain Score 0-No pain    Pain Location Hip    Pain Onset More than a month ago    Multiple Pain Sites Yes    Pain Score 1    Pain Location Leg   residual limb   Pain Orientation Left;Lateral    Pain Descriptors / Indicators Other (Comment)   rock in shoe  feeling   Pain Type Chronic pain    Pain Onset More than a month ago    Aggravating Factors  new prosthesis    Pain Score 3   in last day since new prosthesis up to 5/10   Pain Location Back    Pain Orientation Lower;Mid    Pain Descriptors / Indicators Aching    Pain Type Acute pain    Pain Onset Yesterday    Pain Frequency Intermittent    Aggravating Factors  new prosthesis & alignment    Pain Relieving Factors sitting to rest                 Prosthetics Assessment - 11/17/21 0800       Prosthetics   Prosthesis Description interface 59mm silicon liner with anti-stretch strips, flexible inner liner with suction sleeve suspension, total contact socket, hydraulic foot with resistance to PF & DF in closed chain stance.                          [facility] Adult PT Treatment/Exercise - 11/17/21 0800       Ambulation/Gait   Ambulation/Gait Yes    Ambulation/Gait Assistance 5: Supervision    Ambulation/Gait Assistance Details arrived with cane & new prosthesis.  verbal & demo cues on not abducting prosthesis.    Ambulation Distance (Feet) 100 Feet   100' X 3   Ramp 4: Min assist   cane & new TTA prosthesis   Ramp Details (indicate cue type and reason) tactile & verbal cues on weight shift & posture.    Curb 4: Min assist   curb & new TTA prosthesis.   Curb Details (indicate cue type and reason) MinA with loss of balance stepping up with RLE and down with LLE.  Pt attempted stepping up with prosthesis first but not strong enough without HHA.      Neuro Re-ed    Neuro Re-ed Details  --      Knee/Hip Exercises: Stretches   Active Hamstring Stretch --    Active Hamstring Stretch Limitations --      Knee/Hip Exercises: Aerobic   Nustep Seat 12 level 8 with UE/LE X 5 min      Knee/Hip Exercises: Machines for Strengthening   Total Gym Leg Press --      Knee/Hip Exercises: Standing   Forward Step Up Hand Hold: 2;15 reps;Both;Step Height: 6"    Forward Step Up  Limitations demo & verbal cues on engaging new prosthesis including loading foot.    Step Down Hand Hold: 2;Both;1 set;15 reps;Step Height: 6"    Step Down Limitations demo & verbal cues on engaging new prosthesis including loading foot.  SLS with Vectors --    Other Standing Knee Exercises stepping prosthesis forward & backward loading foot lifting RLE off ground for single limb stance on prosthesis.  10 reps 2 sets.    Other Standing Knee Exercises --      Prosthetics   Prosthetic Care Comments  PT instructed in donning new prosthesis with inner liner anti-stretch strips not over bones esp Fibula where he is feeling "stone in shoe"  Also needs to weight bear on prosthesis to seat limb into prosthesis then roll suspension liner down & back up to take some slack from suspension liner.  He may benefit from hamstring reliefs in both flexible liner & laminated socket.    Current prosthetic wear tolerance (days/week)  daily    Current prosthetic wear tolerance (#hours/day)  most of awake hours    Residual limb condition  no skin breakdown or redness.    Education Provided Proper Donning;Other (comment)   see prosthetic care   Person(s) Educated Patient    Education Method Explanation;Verbal cues;Demonstration;Tactile cues    Education Method Verbalized understanding;Returned demonstration;Needs further instruction                       PT Short Term Goals - 09/16/21 0937       PT SHORT TERM GOAL #1   Title He will be I and compliant with HEP    Time 4    Period Weeks    Status Achieved    Target Date 09/15/21      PT SHORT TERM GOAL #2   Title He will be able to ambulate 200 feet with SPC mod I to supervision    Baseline met 1/4    Time 4    Period Weeks    Status Achieved    Target Date 09/15/21      PT SHORT TERM GOAL #3   Title -      PT SHORT TERM GOAL #4   Title -               PT Long Term Goals - 10/13/21 0856       PT LONG TERM GOAL #1   Title  He will improve FOTO functional score to 59%    Time 8    Period Weeks    Status On-going    Target Date 11/24/21      PT LONG TERM GOAL #2   Title Patient will improve Rt hip strength to 5/5 MMT all planes for functional activity    Baseline met 1/31    Time 8    Period Weeks    Status Achieved    Target Date 10/13/21      PT LONG TERM GOAL #3   Title Berg Balance >/= 43/56 to indicate lower fall risk    Baseline 40 on 1/31    Time 8    Period Weeks    Status On-going    Target Date 11/24/21      PT LONG TERM GOAL #4   Title Patient ambulates 300' with SPC to no AD    Baseline met 1/31    Time 8    Period Weeks    Status Achieved      PT LONG TERM GOAL #5   Title Patient will improve Rt hip pain to overall less than 3/10 with ususal activity    Baseline no pain this week in rt hip but is limited with Lt residual limb pain  Time 8    Period Weeks    Status Achieved    Target Date 10/13/21                   Plan - 11/17/21 0807     Clinical Impression Statement Patient appears to understand instruction for new prosthesis including donning. PT worked on standing functional activities to load new foot and improve BLE ability to raise / lower body weight similar to curb. He needs additional instruction & work to enable safe negotiation for curb with cane or less support.  He appears will need recertification next week.    Personal Factors and Comorbidities Comorbidity 3+;Fitness;Time since onset of injury/illness/exacerbation;Past/Current Experience    Comorbidities RT THA, Left TTA, left THA (05/23/20), arthritis, DM2, HTN, HLD, PVD    Examination-Activity Limitations Locomotion Level;Squat;Stairs;Stand;Transfers;Lift;Sleep    Examination-Participation Restrictions Community Activity;Occupation;Driving;Shop    Stability/Clinical Decision Making Evolving/Moderate complexity    Rehab Potential Good    PT Frequency 2x / week    PT Duration 8 weeks    PT  Treatment/Interventions ADLs/Self Care Home Management;DME Instruction;Gait training;Stair training;Functional mobility training;Therapeutic activities;Therapeutic exercise;Balance training;Neuromuscular re-education;Patient/family education;Prosthetic Training;Vestibular;Dry needling;Manual techniques;Electrical Stimulation;Cryotherapy;Ultrasound;Passive range of motion;Joint Manipulations    PT Next Visit Plan work on standing balance & gait with cane or less UE support,  check how new prosthesis is doing with instruction given today, 3/7. Check back pain related to new prosthesis.    PT Home Exercise Plan squats at counter top with UE support and chair behind him, standing hip flexion and abd with red band, standing hip flexor stretch    Consulted and Agree with Plan of Care Patient             Patient will benefit from skilled therapeutic intervention in order to improve the following deficits and impairments:  Abnormal gait, Decreased activity tolerance, Decreased balance, Decreased endurance, Decreased knowledge of use of DME, Decreased mobility, Decreased range of motion, Decreased skin integrity, Decreased strength, Increased edema, Postural dysfunction, Prosthetic Dependency, Pain, Impaired flexibility  Visit Diagnosis: Pain in right hip  Other abnormalities of gait and mobility  Unsteadiness on feet  Stiffness of right hip, not elsewhere classified  Muscle weakness (generalized)  Pain in left lower leg     Problem List Patient Active Problem List   Diagnosis Date Noted   S/P BKA (below knee amputation) unilateral, left ([place]) 10/03/2020   Non-healing wound of lower extremity 10/03/2020   History of amputation of left foot through metatarsal bone ([place]) 09/11/2020   PAD (peripheral artery disease) ([place]) 08/20/2020   Status post total replacement of left hip 06/05/2020   Unilateral primary osteoarthritis, right hip 04/17/2020   Unilateral primary osteoarthritis, left hip  04/17/2020   Amputated toe, right ([place]) 11/22/2018   Type 2 diabetes mellitus with foot ulcer, without long-term current use of insulin ([place]) 11/22/2018   Mixed hyperlipidemia 11/22/2018   Aneurysm of left popliteal artery ([place]) 11/22/2018   Status post peripheral artery angioplasty with insertion of stent 11/22/2018   Osteomyelitis of third toe of right foot ([place])    Cellulitis of third toe of right foot     Jamey Reas, PT, DPT 11/17/2021, 10:50 AM  [facility] Physical Therapy [address] [place], [place], [address] Phone: [phone]   Fax:  [phone]  Name: Anthony Park MRN: [id number] Date of Birth: 12/09/1960

## 2021-11-19 ENCOUNTER — Ambulatory Visit (INDEPENDENT_AMBULATORY_CARE_PROVIDER_SITE_OTHER): Payer: BC Managed Care – PPO | Admitting: Physical Therapy

## 2021-11-19 ENCOUNTER — Other Ambulatory Visit: Payer: Self-pay

## 2021-11-19 ENCOUNTER — Encounter: Payer: Self-pay | Admitting: Physical Therapy

## 2021-11-19 DIAGNOSIS — M79662 Pain in left lower leg: Secondary | ICD-10-CM

## 2021-11-19 DIAGNOSIS — M25551 Pain in right hip: Secondary | ICD-10-CM

## 2021-11-19 DIAGNOSIS — M25651 Stiffness of right hip, not elsewhere classified: Secondary | ICD-10-CM | POA: Diagnosis not present

## 2021-11-19 DIAGNOSIS — R2689 Other abnormalities of gait and mobility: Secondary | ICD-10-CM | POA: Diagnosis not present

## 2021-11-19 DIAGNOSIS — R2681 Unsteadiness on feet: Secondary | ICD-10-CM

## 2021-11-19 DIAGNOSIS — M6281 Muscle weakness (generalized): Secondary | ICD-10-CM

## 2021-11-19 NOTE — Therapy (Signed)
[facility] Physical Therapy [address] [place], [place], [address] Phone: [phone]   Fax:  [phone]  Physical Therapy Treatment  Patient Details  Name: Anthony Park MRN: [id number] Date of Birth: [birth year] Referring Provider (PT): Carney Bern   Encounter Date: 11/19/2021   PT End of Session - 11/19/21 0818     Visit Number 22    Number of Visits 24    Date for PT Re-Evaluation 11/24/21    Authorization Type BCBS comm ppo    Authorization Time Period 2023 Deductible ($1,450.00)$0.00 Paid $1,450.00 to go, Out-of-Pocket Limit ($2,650.00)  $0.00 Paid $2,650.00 to go    Authorization - Visit Number 13    Authorization - Number of Visits 116    PT Start Time 0804    PT Stop Time 0845    PT Time Calculation (min) 41 min    Activity Tolerance Patient tolerated treatment well    Behavior During Therapy [facility] for tasks assessed/performed             Past Medical History:  Diagnosis Date   Arthritis    Diabetes mellitus without complication ([place])    type 2 controlled with diet    Diabetic toe ulcer ([place]) 11/05/2018   History of kidney stones    passed 1 several years ago   Hyperlipidemia    Hypertension    not on medications   Peripheral vascular disease ([place])    stent in left leg due to anerysm     Past Surgical History:  Procedure Laterality Date    Left Transmetatarsal Ampuation (Left Foot)  08/20/2020   ABDOMINAL AORTOGRAM W/LOWER EXTREMITY N/A 08/06/2020   Procedure: ABDOMINAL AORTOGRAM W/LOWER EXTREMITY;  Surgeon: Marty Heck, MD;  Location: [place] [facility];  Service: Cardiovascular;  Laterality: N/A;   AMPUTATION Right 11/07/2018   Procedure: RIGHT 3RD TOE AMPUTATION;  Surgeon: Newt Minion, MD;  Location: [place];  Service: Orthopedics;  Laterality: Right;   AMPUTATION Left 08/20/2020   Procedure: Left Transmetatarsal Ampuation;  Surgeon: Marty Heck, MD;  Location: [place];  Service: Vascular;  Laterality: Left;    AMPUTATION Left 10/03/2020   Procedure: LEFT BELOW KNEE AMPUTATION;  Surgeon: Marty Heck, MD;  Location: [place];  Service: Vascular;  Laterality: Left;   AMPUTATION TOE Left    big toe and one next to it   CATARACT EXTRACTION Bilateral    EYE SURGERY Bilateral    cataracts removed   INSERTION OF ILIAC STENT Left    PERIPHERAL VASCULAR BALLOON ANGIOPLASTY Left 08/06/2020   Procedure: PERIPHERAL VASCULAR BALLOON ANGIOPLASTY;  Surgeon: Marty Heck, MD;  Location: [place] [facility];  Service: Cardiovascular;  Laterality: Left;  Peroneal artery.   TOE AMPUTATION Left 2015   TOTAL HIP ARTHROPLASTY Left 05/23/2020   Procedure: LEFT TOTAL HIP ARTHROPLASTY ANTERIOR APPROACH;  Surgeon: Mcarthur Rossetti, MD;  [facility];  Service: Orthopedics;  Laterality: Left;   TOTAL HIP ARTHROPLASTY Right 07/31/2021   Procedure: RIGHT TOTAL HIP ARTHROPLASTY ANTERIOR APPROACH;  Surgeon: Mcarthur Rossetti, MD;  [facility];  Service: Orthopedics;  Laterality: Right;   WOUND DEBRIDEMENT Left 09/11/2020   Procedure: DEBRIDEMENT OF LEFT TRANSMETATARSAL WOUND;  Surgeon: Cherre Robins, MD;  Location: [place];  Service: Vascular;  Laterality: Left;    There were no vitals filed for this visit.   Subjective Assessment - 11/19/21 0821     Subjective He relays feels pretty good overall upon arrival and arrives  with only SPC vs rollator. He does feel like he is slightly longer/taller on one side.    Pertinent History Left TTTA, left THA (05/23/20), arthritis, DM2, HTN, HLD, PVD    Patient Stated Goals Wants to use prosthesis to return to some work, get back in community, be mobile, work in gym in his garage    Pain Onset More than a month ago    Pain Onset More than a month ago    Pain Onset Yesterday                               [facility] Adult PT Treatment/Exercise - 11/19/21 0001       Ambulation/Gait   Ambulation/Gait Yes    Ambulation/Gait Assistance  5: Supervision    Ambulation Distance (Feet) 150 Feet   150 with SPC, 10 feet X 10 no AD in bars     Neuro Re-ed    Neuro Re-ed Details  lateral walking and retrowalking in bars (discontinued retrowalking due to pain) without UE support. Balance on foam beam with diagonal head turns, step ups onto foam beam and reciprocal step down X 5 bilat.      Knee/Hip Exercises: Machines for Strengthening   Total Gym Leg Press DL 125# 2X15, Rt leg only 87# 2X15, Lt leg only 37# 2X15      Knee/Hip Exercises: Standing   Forward Step Up Both;10 reps;Hand Hold: 2;Step Height: 4"    Step Down Hand Hold: 2;Both;1 set;15 reps;Step Height: 4"                       PT Short Term Goals - 09/16/21 0937       PT SHORT TERM GOAL #1   Title He will be I and compliant with HEP    Time 4    Period Weeks    Status Achieved    Target Date 09/15/21      PT SHORT TERM GOAL #2   Title He will be able to ambulate 200 feet with SPC mod I to supervision    Baseline met 1/4    Time 4    Period Weeks    Status Achieved    Target Date 09/15/21      PT SHORT TERM GOAL #3   Title -      PT SHORT TERM GOAL #4   Title -               PT Long Term Goals - 10/13/21 0856       PT LONG TERM GOAL #1   Title He will improve FOTO functional score to 59%    Time 8    Period Weeks    Status On-going    Target Date 11/24/21      PT LONG TERM GOAL #2   Title Patient will improve Rt hip strength to 5/5 MMT all planes for functional activity    Baseline met 1/31    Time 8    Period Weeks    Status Achieved    Target Date 10/13/21      PT LONG TERM GOAL #3   Title Berg Balance >/= 43/56 to indicate lower fall risk    Baseline 40 on 1/31    Time 8    Period Weeks    Status On-going    Target Date 11/24/21      PT LONG TERM GOAL #4  Title Patient ambulates 300' with SPC to no AD    Baseline met 1/31    Time 8    Period Weeks    Status Achieved      PT LONG TERM GOAL #5   Title  Patient will improve Rt hip pain to overall less than 3/10 with ususal activity    Baseline no pain this week in rt hip but is limited with Lt residual limb pain    Time 8    Period Weeks    Status Achieved    Target Date 10/13/21                   Plan - 11/19/21 3009     Clinical Impression Statement When examining his pelvis in standing it does appear that left illiac crest is slightly higher which could be due to longer limb on that side now. He says he will reach out to prosthetist about this. We then worked on his overall dynamic balance, gait without AD, and leg strength to his tolerance. He does still note some left anterior knee pain with step downs and retro walking.    Personal Factors and Comorbidities Comorbidity 3+;Fitness;Time since onset of injury/illness/exacerbation;Past/Current Experience    Comorbidities RT THA, Left TTA, left THA (05/23/20), arthritis, DM2, HTN, HLD, PVD    Examination-Activity Limitations Locomotion Level;Squat;Stairs;Stand;Transfers;Lift;Sleep    Examination-Participation Restrictions Community Activity;Occupation;Driving;Shop    Stability/Clinical Decision Making Evolving/Moderate complexity    Rehab Potential Good    PT Frequency 2x / week    PT Duration 8 weeks    PT Treatment/Interventions ADLs/Self Care Home Management;DME Instruction;Gait training;Stair training;Functional mobility training;Therapeutic activities;Therapeutic exercise;Balance training;Neuromuscular re-education;Patient/family education;Prosthetic Training;Vestibular;Dry needling;Manual techniques;Electrical Stimulation;Cryotherapy;Ultrasound;Passive range of motion;Joint Manipulations    PT Next Visit Plan will need recert next visit    PT Home Exercise Plan squats at counter top with UE support and chair behind him, standing hip flexion and abd with red band, standing hip flexor stretch    Consulted and Agree with Plan of Care Patient             Patient will  benefit from skilled therapeutic intervention in order to improve the following deficits and impairments:  Abnormal gait, Decreased activity tolerance, Decreased balance, Decreased endurance, Decreased knowledge of use of DME, Decreased mobility, Decreased range of motion, Decreased skin integrity, Decreased strength, Increased edema, Postural dysfunction, Prosthetic Dependency, Pain, Impaired flexibility  Visit Diagnosis: Pain in right hip  Other abnormalities of gait and mobility  Unsteadiness on feet  Stiffness of right hip, not elsewhere classified  Muscle weakness (generalized)  Pain in left lower leg     Problem List Patient Active Problem List   Diagnosis Date Noted   S/P BKA (below knee amputation) unilateral, left ([place]) 10/03/2020   Non-healing wound of lower extremity 10/03/2020   History of amputation of left foot through metatarsal bone ([place]) 09/11/2020   PAD (peripheral artery disease) ([place]) 08/20/2020   Status post total replacement of left hip 06/05/2020   Unilateral primary osteoarthritis, right hip 04/17/2020   Unilateral primary osteoarthritis, left hip 04/17/2020   Amputated toe, right ([place]) 11/22/2018   Type 2 diabetes mellitus with foot ulcer, without long-term current use of insulin ([place]) 11/22/2018   Mixed hyperlipidemia 11/22/2018   Aneurysm of left popliteal artery ([place]) 11/22/2018   Status post peripheral artery angioplasty with insertion of stent 11/22/2018   Osteomyelitis of third toe of right foot ([facility])    Cellulitis of third toe of right  foot     Debbe Odea, PT,DPT 11/19/2021, 8:52 AM  [facility] Physical Therapy [address] [place], [place], [address] Phone: [phone]   Fax:  [phone]  Name: Anthony Park MRN: [id number] Date of Birth: [birth year]

## 2021-11-24 ENCOUNTER — Encounter: Payer: BC Managed Care – PPO | Admitting: Physical Therapy

## 2021-11-26 ENCOUNTER — Encounter: Payer: Self-pay | Admitting: Physical Therapy

## 2021-11-26 ENCOUNTER — Other Ambulatory Visit: Payer: Self-pay

## 2021-11-26 ENCOUNTER — Ambulatory Visit (INDEPENDENT_AMBULATORY_CARE_PROVIDER_SITE_OTHER): Payer: BC Managed Care – PPO | Admitting: Physical Therapy

## 2021-11-26 DIAGNOSIS — R2689 Other abnormalities of gait and mobility: Secondary | ICD-10-CM | POA: Diagnosis not present

## 2021-11-26 DIAGNOSIS — M25551 Pain in right hip: Secondary | ICD-10-CM

## 2021-11-26 DIAGNOSIS — M6281 Muscle weakness (generalized): Secondary | ICD-10-CM

## 2021-11-26 DIAGNOSIS — R2681 Unsteadiness on feet: Secondary | ICD-10-CM

## 2021-11-26 DIAGNOSIS — M79662 Pain in left lower leg: Secondary | ICD-10-CM

## 2021-11-26 NOTE — Therapy (Signed)
[facility] ?[address] ?[place], [place], [address] ?Phone: [phone]   Fax:  [phone] ? ?Physical Therapy Treatment & Recertification ? ?Patient Details  ?Name: Anthony Park ?MRN: [id number] ?Date of Birth: [birth year] ?Referring Provider (PT): Kriste Basque ? ? ?Encounter Date: 11/26/2021 ? ? PT End of Session - 11/26/21 0805   ? ? Visit Number 23   ? Number of Visits 39   ? Date for PT Re-Evaluation 01/22/22   ? Authorization Type BCBS comm ppo   ? Authorization Time Period 2023 Deductible ($1,450.00)$0.00 Paid $1,450.00 to go, Out-of-Pocket Limit ($2,650.00)  $0.00 Paid $2,650.00 to go   ? Authorization - Visit Number 14   ? Authorization - Number of Visits 116   ? PT Start Time 0800   ? PT Stop Time 0845   ? PT Time Calculation (min) 45 min   ? Activity Tolerance Patient tolerated treatment well   ? Behavior During Therapy [facility] for tasks assessed/performed   ? ?  ?  ? ?  ? ? ?Past Medical History:  ?Diagnosis Date  ? Arthritis   ? Diabetes mellitus without complication (HCC)   ? type 2 controlled with diet   ? Diabetic toe ulcer (HCC) 11/05/2018  ? History of kidney stones   ? passed 1 several years ago  ? Hyperlipidemia   ? Hypertension   ? not on medications  ? Peripheral vascular disease (HCC)   ? stent in left leg due to anerysm   ? ? ?Past Surgical History:  ?Procedure Laterality Date  ?  Left Transmetatarsal Ampuation (Left Foot)  08/20/2020  ? ABDOMINAL AORTOGRAM W/LOWER EXTREMITY N/A 08/06/2020  ? Procedure: ABDOMINAL AORTOGRAM W/LOWER EXTREMITY;  Surgeon: [name], MD;  Location[facility] INVASIVE CV LAB;  Service: Cardiovascular;  Laterality: N/A;  ? AMPUTATION Right 11/07/2018  ? Procedure: RIGHT 3RD TOE AMPUTATION;  Surgeon: Nadara Mustard, MD;  Location: [facility] OR;  Service: Orthopedics;  Laterality: Right;  ? AMPUTATION Left 08/20/2020  ? Procedure: Left Transmetatarsal Ampuation;  Surgeon: [name], MD;  Location: [facility] OR;  Service: Vascular;   Laterality: Left;  ? AMPUTATION Left 10/03/2020  ? Procedure: LEFT BELOW KNEE AMPUTATION;  Surgeon: [name], MD;  Location: [facility] OR;  Service: Vascular;  Laterality: Left;  ? AMPUTATION TOE Left   ? big toe and one next to it  ? CATARACT EXTRACTION Bilateral   ? EYE SURGERY Bilateral   ? cataracts removed  ? INSERTION OF ILIAC STENT Left   ? PERIPHERAL VASCULAR BALLOON ANGIOPLASTY Left 08/06/2020  ? Procedure: PERIPHERAL VASCULAR BALLOON ANGIOPLASTY;  Surgeon: [name], MD;  Location: [facility] INVASIVE CV LAB;  Service: Cardiovascular;  Laterality: Left;  Peroneal artery.  ? TOE AMPUTATION Left 2015  ? TOTAL HIP ARTHROPLASTY Left 05/23/2020  ? Procedure: LEFT TOTAL HIP ARTHROPLASTY ANTERIOR APPROACH;  Surgeon: Kathryne Hitch, MD;  Location: [facility] ORS;  Service: Orthopedics;  Laterality: Left;  ? TOTAL HIP ARTHROPLASTY Right 07/31/2021  ? Procedure: RIGHT TOTAL HIP ARTHROPLASTY ANTERIOR APPROACH;  Surgeon: Kathryne Hitch, MD;  Location: [facility] ORS;  Service: Orthopedics;  Laterality: Right;  ? WOUND DEBRIDEMENT Left 09/11/2020  ? Procedure: DEBRIDEMENT OF LEFT TRANSMETATARSAL WOUND;  Surgeon: Leonie Douglas, MD;  Location: [facility] OR;  Service: Vascular;  Laterality: Left;  ? ? ?There were no vitals filed for this visit. ? ? Subjective Assessment - 11/26/21 0800   ? ? Subjective He feels that he has major break through  with mobility going longer & more control. He feels he needs more PT to improve his balance.  He arrived ambulating with cane.  He has not seen prosthetist. [facility]   ? Pertinent History Left TTTA, left THA (05/23/20), arthritis, DM2, HTN, HLD, PVD   ? Patient Stated Goals Wants to use prosthesis to return to some work, get back in community, be mobile, work in gym in his garage   ? Currently in Pain? Yes   ? Pain Score 1    ? Pain Location Hip   ? Pain Orientation Right   ? Pain Descriptors / Indicators Aching   ? Pain Type Chronic pain   ? Pain Onset  More than a month ago   ? Pain Frequency Intermittent   ? Aggravating Factors  sitting too long so hip gets stiff   ? Pain Relieving Factors moving around.   ? Pain Score 0   in last week up to 5/10  ? Pain Location Leg   residual limb  ? Pain Orientation Left   proximal ant-lat & calf  ? Pain Descriptors / Indicators Aching   ? Pain Type Chronic pain   ? Pain Onset More than a month ago   ? Pain Frequency Intermittent   ? Aggravating Factors  sitting too long then getting up   ? Pain Relieving Factors tylenol   ? Pain Onset Yesterday   ? ?  ?  ? ?  ? ? ? ? ? [facility] PT Assessment - 11/26/21 0800   ? ?  ? Assessment  ? Medical Diagnosis Rt THA (anterior approach)   History of Left TTA  ? Referring Provider (PT) Kriste Basquelark, Gill   ? Onset Date/Surgical Date 07/31/21   ?  ? Observation/Other Assessments  ? Focus on Therapeutic Outcomes (FOTO)  42% with target 59%   ?  ? Ambulation/Gait  ? Ambulation Distance (Feet) 250 Feet   ? Assistive device Prosthesis;None   ? Gait velocity 3.5646ft/sec comfortable, 4.39 ft/sec fast   ? Stairs Yes   ? Stairs Assistance 5: Supervision   ? Stairs Assistance Details (indicate cue type and reason) requires 2 rails for alternating pattern & verbal cues for technique;  single rail with step-to modified for prosthetic side.   ? Stair Management Technique One rail Left;Step to pattern;Two rails;Alternating pattern;Forwards   ? Number of Stairs 11   ? Height of Stairs 7   ? Ramp 4: Min assist   min guard, prosthesis only  ? Curb 5: Supervision   prosthesis only  ?  ? Standardized Balance Assessment  ? Standardized Balance Assessment Berg Balance Test;Timed Up and Go Test   ?  ? Berg Balance Test  ? Sit to Stand Able to stand without using hands and stabilize independently   ? Standing Unsupported Able to stand safely 2 minutes   ? Sitting with Back Unsupported but Feet Supported on Floor or Stool Able to sit safely and securely 2 minutes   ? Stand to Sit Sits safely with minimal use of hands   ?  Transfers Able to transfer safely, minor use of hands   ? Standing Unsupported with Eyes Closed Able to stand 10 seconds safely   ? Standing Unsupported with Feet Together Able to place feet together independently and stand 1 minute safely   ? From Standing, Reach Forward with Outstretched Arm Can reach forward >12 cm safely (5")   ? From Standing Position, Pick up  Object from Floor Able to pick up shoe safely and easily   ? From Standing Position, Turn to Look Behind Over each Shoulder Looks behind from both sides and weight shifts well   ? Turn 360 Degrees Able to turn 360 degrees safely but slowly   ? Standing Unsupported, Alternately Place Feet on Step/Stool Able to complete 4 steps without aid or supervision   ? Standing Unsupported, One Foot in Front Able to take small step independently and hold 30 seconds   ? Standing on One Leg Tries to lift leg/unable to hold 3 seconds but remains standing independently   ? Total Score 46   ? Berg comment: BERG  < 36 high risk for falls (close to 100%) 46-51 moderate (>50%)   37-45 significant (>80%) 52-55 lower (> 25%)   ?  ? Timed Up and Go Test  ? Normal TUG (seconds) 14.12   14.12sec pros only  >13.5 sec indicates high fall risk  ? Cognitive TUG (seconds) 17.31   17.31sec with prosthesis only math subtracting with 2 errors & stopped naming during turn,  >15 sec indicates high fall risk  ?  ? Functional Gait  Assessment  ? Gait assessed  Yes   ? Gait Level Surface Walks 20 ft in less than 7 sec but greater than 5.5 sec, uses assistive device, slower speed, mild gait deviations, or deviates 6-10 in outside of the 12 in walkway width.   ? Change in Gait Speed Makes only minor adjustments to walking speed, or accomplishes a change in speed with significant gait deviations, deviates 10-15 in outside the 12 in walkway width, or changes speed but loses balance but is able to recover and continue walking.   ? Gait with Horizontal Head Turns Performs head turns with moderate  changes in gait velocity, slows down, deviates 10-15 in outside 12 in walkway width but recovers, can continue to walk.   ? Gait with Vertical Head Turns Performs task with moderate change in gait velocity, sl

## 2021-12-01 ENCOUNTER — Encounter: Payer: BC Managed Care – PPO | Admitting: Physical Therapy

## 2021-12-03 ENCOUNTER — Encounter: Payer: Self-pay | Admitting: Physical Therapy

## 2021-12-03 ENCOUNTER — Other Ambulatory Visit: Payer: Self-pay

## 2021-12-03 ENCOUNTER — Telehealth: Payer: Self-pay | Admitting: Orthopaedic Surgery

## 2021-12-03 ENCOUNTER — Ambulatory Visit (INDEPENDENT_AMBULATORY_CARE_PROVIDER_SITE_OTHER): Payer: BC Managed Care – PPO | Admitting: Physical Therapy

## 2021-12-03 ENCOUNTER — Telehealth: Payer: Self-pay

## 2021-12-03 DIAGNOSIS — R2681 Unsteadiness on feet: Secondary | ICD-10-CM

## 2021-12-03 DIAGNOSIS — M25551 Pain in right hip: Secondary | ICD-10-CM | POA: Diagnosis not present

## 2021-12-03 DIAGNOSIS — M6281 Muscle weakness (generalized): Secondary | ICD-10-CM

## 2021-12-03 DIAGNOSIS — R2689 Other abnormalities of gait and mobility: Secondary | ICD-10-CM | POA: Diagnosis not present

## 2021-12-03 DIAGNOSIS — M79662 Pain in left lower leg: Secondary | ICD-10-CM

## 2021-12-03 NOTE — Telephone Encounter (Signed)
Anthony Park has been out of the office. Did you maybe try to call him? Hes a Dr. Lajoyce Corners pt. ?

## 2021-12-03 NOTE — Therapy (Signed)
[facility] ?[address] ?[place], [place], [address] ?Phone: [phone]   Fax:  [phone] ? ?Physical Therapy Treatment ? ?Patient Details  ?Name: Anthony Park ?MRN: [id number] ?Date of Birth: June 29, 1961 ?Referring Provider (PT): Kriste Basque ? ? ?Encounter Date: 12/03/2021 ? ? PT End of Session - 12/03/21 0759   ? ? Visit Number 24   ? Number of Visits 39   ? Date for PT Re-Evaluation 01/22/22   ? Authorization Type BCBS comm ppo   ? Authorization Time Period 2023 Deductible ($1,450.00)$0.00 Paid $1,450.00 to go, Out-of-Pocket Limit ($2,650.00)  $0.00 Paid $2,650.00 to go   ? Authorization - Visit Number 15   ? Authorization - Number of Visits 116   ? PT Start Time [phone]   ? PT Stop Time 0844   ? PT Time Calculation (min) 45 min   ? Activity Tolerance Patient tolerated treatment well   ? Behavior During Therapy [facility] for tasks assessed/performed   ? ?  ?  ? ?  ? ? ?Past Medical History:  ?Diagnosis Date  ? Arthritis   ? Diabetes mellitus without complication (HCC)   ? type 2 controlled with diet   ? Diabetic toe ulcer (HCC) 11/05/2018  ? History of kidney stones   ? passed 1 several years ago  ? Hyperlipidemia   ? Hypertension   ? not on medications  ? Peripheral vascular disease (HCC)   ? stent in left leg due to anerysm   ? ? ?Past Surgical History:  ?Procedure Laterality Date  ?  Left Transmetatarsal Ampuation (Left Foot)  08/20/2020  ? ABDOMINAL AORTOGRAM W/LOWER EXTREMITY N/A 08/06/2020  ? Procedure: ABDOMINAL AORTOGRAM W/LOWER EXTREMITY;  Surgeon: [name], MD;  Location: [facility] INVASIVE CV LAB;  Service: Cardiovascular;  Laterality: N/A;  ? AMPUTATION Right 11/07/2018  ? Procedure: RIGHT 3RD TOE AMPUTATION;  Surgeon: Nadara Mustard, MD;  Location: [facility] OR;  Service: Orthopedics;  Laterality: Right;  ? AMPUTATION Left 08/20/2020  ? Procedure: Left Transmetatarsal Ampuation;  Surgeon: [name], MD;  Location: [facility] OR;  Service: Vascular;  Laterality: Left;   ? AMPUTATION Left 10/03/2020  ? Procedure: LEFT BELOW KNEE AMPUTATION;  Surgeon: [name], MD;  Location: [facility] OR;  Service: Vascular;  Laterality: Left;  ? AMPUTATION TOE Left   ? big toe and one next to it  ? CATARACT EXTRACTION Bilateral   ? EYE SURGERY Bilateral   ? cataracts removed  ? INSERTION OF ILIAC STENT Left   ? PERIPHERAL VASCULAR BALLOON ANGIOPLASTY Left 08/06/2020  ? Procedure: PERIPHERAL VASCULAR BALLOON ANGIOPLASTY;  Surgeon: [name], MD;  Location: [facility] INVASIVE CV LAB;  Service: Cardiovascular;  Laterality: Left;  Peroneal artery.  ? TOE AMPUTATION Left 2015  ? TOTAL HIP ARTHROPLASTY Left 05/23/2020  ? Procedure: LEFT TOTAL HIP ARTHROPLASTY ANTERIOR APPROACH;  Surgeon: Kathryne Hitch, MD;  Location: [facility] ORS;  Service: Orthopedics;  Laterality: Left;  ? TOTAL HIP ARTHROPLASTY Right 07/31/2021  ? Procedure: RIGHT TOTAL HIP ARTHROPLASTY ANTERIOR APPROACH;  Surgeon: Kathryne Hitch, MD;  Location: [facility] ORS;  Service: Orthopedics;  Laterality: Right;  ? WOUND DEBRIDEMENT Left 09/11/2020  ? Procedure: DEBRIDEMENT OF LEFT TRANSMETATARSAL WOUND;  Surgeon: Leonie Douglas, MD;  Location: [facility] OR;  Service: Vascular;  Laterality: Left;  ? ? ?There were no vitals filed for this visit. ? ? Subjective Assessment - 12/03/21 0759   ? ? Subjective the new prosthesis requires him to redonne to get comfortable.   ?  Pertinent History Left TTTA, left THA (05/23/20), arthritis, DM2, HTN, HLD, PVD   ? Patient Stated Goals Wants to use prosthesis to return to some work, get back in community, be mobile, work in gym in his garage   ? Currently in Pain? No/denies   ? Pain Location Hip   ? Pain Orientation Right   ? Pain Onset More than a month ago   ? Pain Score 0   since last PT most 5/10 which is alleviated by redonning prosthesis.  ? Pain Location Leg   residual limb  ? Pain Orientation Left   ? Pain Onset More than a month ago   ? Pain Onset Yesterday   ? ?  ?  ? ?   ? ? ? ? ? ? ? ? ? ? ? ? ? ? ? ? ? ? ? ? [facility] Adult PT Treatment/Exercise - 12/03/21 0800   ? ?  ? Ambulation/Gait  ? Ambulation/Gait Yes   ? Ambulation/Gait Assistance 5: Supervision   ? Ambulation Distance (Feet) --   ? Assistive device Prosthesis;None   ? Gait velocity --   ? Stairs --   ? Stairs Assistance --   ? Stair Management Technique --   ? Number of Stairs --   ? Height of Stairs --   ? Ramp --   ? Curb --   ?  ? Knee/Hip Exercises: Aerobic  ? Nustep --   ?  ? Knee/Hip Exercises: Machines for Strengthening  ? Total Gym Leg Press DL 161#[id number]# [id number], Rt leg only 87# 20 reps, Lt leg only 62# 20 reps   ?  ? Prosthetics  ? Prosthetic Care Comments  PT demo & verbal cues on reset ankle hydraulics so pylon not on posterior lean before arising. Pt verbalized & return demo understanding.   ? Current prosthetic wear tolerance (days/week)  daily   ? Current prosthetic wear tolerance (#hours/day)  most of awake hours   ? Residual limb condition  no skin breakdown or redness. intermittent tenderness distal tibia & fibula   ? ?  ?  ? ?  ? ? ? ? ? ? ? ? ? ? PT Education - 12/03/21 0837   ? ? Education Details Access Code: [email or web address]   ? Person(s) Educated Patient   ? Methods Explanation;Demonstration;Verbal cues;Handout   ? Comprehension Verbalized understanding;Returned demonstration   ? ?  ?  ? ?  ? ? ? PT Short Term Goals - 11/26/21 1427   ? ?  ? PT SHORT TERM GOAL #1  ? Title Programmer, systemsBerg Balance >/= [phone]   ? Time 4   ? Period Weeks   ? Status New   ? Target Date 12/24/21   ?  ? PT SHORT TERM GOAL #2  ? Title Pt negotiates stairs with single rail alternating pattern with minimal cues only.   ? Time 4   ? Period Weeks   ? Status New   ? Target Date 12/24/21   ? ?  ?  ? ?  ? ? ? ? PT Long Term Goals - 11/26/21 1431   ? ?  ? PT LONG TERM GOAL #1  ? Title He will improve FOTO functional score to 59%   ? Time 8   ? Period Weeks   ? Status On-going   ? Target Date 01/22/22   ?  ? PT LONG TERM GOAL #2  ? Title Programmer, systemsBerg Balance >/= 52/56    ? Time 8   ?  Period Weeks   ? Status Revised   ? Target Date 01/22/22   ?  ? PT LONG TERM GOAL #3  ? Title Timed Up & Go with prosthesis only <13.5sec and cognitive TUG <15.5sec to indicate lower fall risk.   ? Time 8   ? Period Weeks   ? Status New   ? Target Date 01/22/22   ?  ? PT LONG TERM GOAL #4  ? Title Functional Gait Assessment with prosthesis only >/= 19/30 to indicate lower fall risk.   ? Time 8   ? Period Weeks   ? Status New   ? Target Date 01/22/22   ?  ? PT LONG TERM GOAL #5  ? Title Patient ambulates 500' and negotiates ramps, curbs & stairs single rail alternating pattern with prosthesis only modified independent.   ? Time 8   ? Period Weeks   ? Status New   ? Target Date 01/22/22   ?  ? Additional Long Term Goals  ? Additional Long Term Goals Yes   ?  ? PT LONG TERM GOAL #6  ? Title Patient reports pain </= 2/10 in right hip & left residual limb with standing & gait activities.   ? Time 8   ? Period Weeks   ? Status Revised   ? Target Date 01/22/22   ? ?  ?  ? ?  ? ? ? ? ? ? ? ? Plan - 12/03/21 0800   ? ? Clinical Impression Statement PT updated HEP to work on posture including flexibility and resistive exercises with UEs & LEs for core stability / balance with functional strength.  He appears to understand exercises & rationale.   ? Personal Factors and Comorbidities Comorbidity 3+;Fitness;Time since onset of injury/illness/exacerbation;Past/Current Experience   ? Comorbidities RT THA, Left TTA, left THA (05/23/20), arthritis, DM2, HTN, HLD, PVD   ? Examination-Activity Limitations Locomotion Level;Squat;Stairs;Stand;Transfers;Lift;Sleep   ? Examination-Participation Restrictions Community Activity;Occupation;Driving;Shop   ? Stability/Clinical Decision Making Evolving/Moderate complexity   ? Rehab Potential Good   ? PT Frequency 2x / week   ? PT Duration 8 weeks   ? PT Treatment/Interventions ADLs/Self Care Home Management;DME Instruction;Gait training;Stair training;Functional mobility  training;Therapeutic activities;Therapeutic exercise;Balance training;Neuromuscular re-education;Patient/family education;Prosthetic Training;Vestibular;Dry needling;Manual techniques;Electrical Stimulation;Cryotherapy;Joint Manip

## 2021-12-03 NOTE — Telephone Encounter (Signed)
Anthony Park dropped off paper work for this pt that you saw in therapy today that is s/p a total hip with CB. He said that this is for [place] to complete for the pt's BKA. Happy to do this there would not be any restriction on our part. Is this for his total hip that he had done in November? If so I can give this back to gil to complete (does the pt have any restrictions from your standpoint?)  ?

## 2021-12-03 NOTE — Telephone Encounter (Signed)
Pt called. He thinks that Caryl Pina called him.  ? ?CB [phone]  ?

## 2021-12-03 NOTE — Telephone Encounter (Signed)
PT brought down paperwork for pt. Employer questioning if he has any restrictions. The pt is a prior BKA with a new THR on the other side. Message to robin to see if this is clearance for BKA or total hip.  ?

## 2021-12-03 NOTE — Patient Instructions (Signed)
Access Code: [email or web address] ?URL: [email or web address] ?Date: 12/03/2021 ?Prepared by: Jamey Reas ? ?Exercises ?- Alternating Punch with Resistance  - 1 x daily - 7 x weekly - 1 sets - 10 reps - 5 seconds hold ?- Standing Scapular Protraction with Resistance  - 1 x daily - 7 x weekly - 1 sets - 10 reps - 5 seconds hold ?- Standing alternate rows with resistance  - 1 x daily - 7 x weekly - 1 sets - 10 reps - 5 seconds hold ?- Standing Row with Anchored Resistance  - 1 x daily - 7 x weekly - 1 sets - 10 reps - 5 seconds hold ?- Alternating elbow flexion with resistance  - 1 x daily - 7 x weekly - 1 sets - 10 reps - 5 seconds hold ?- Standing Bicep Curls with Resistance  - 1 x daily - 7 x weekly - 1 sets - 10 reps - 5 seconds hold ?- Standing Hip Flexion with Resistance (Mirrored)  - 1 x daily - 7 x weekly - 1 sets - 10 reps ?- Standing Hip Extension with Resistance  - 1 x daily - 7 x weekly - 1 sets - 10 reps ?- Standing Hip Adduction with Resistance (Mirrored)  - 1 x daily - 7 x weekly - 1 sets - 10 reps ?- Standing Hip Abduction with Theraband Resistance  - 1 x daily - 7 x weekly - 1 sets - 10 reps ?- Seated Table Hamstring Stretch  - 1 x daily - 7 x weekly - 1 sets - 2-3 reps - 30 seconds hold ?- Modified Thomas Stretch  - 1 x daily - 7 x weekly - 1 sets - 2-3 reps - 30 seconds hold ?- Standing posture with back to counter  - 2 x daily - 7 x weekly - 1 sets - 1 reps - 5-10 minutes hold ?- Upright Stance at Door Frame Single Arm  - 3-6 x daily - 7 x weekly - 1 sets - 2 reps - 2 deep breathes hold ?- Upright Stance at Door Frame with Both Arms  - 3-6 x daily - 7 x weekly - 1 sets - 2 reps - 2 deep breathes hold ? ?

## 2021-12-08 ENCOUNTER — Other Ambulatory Visit: Payer: Self-pay

## 2021-12-08 ENCOUNTER — Ambulatory Visit (INDEPENDENT_AMBULATORY_CARE_PROVIDER_SITE_OTHER): Payer: BC Managed Care – PPO | Admitting: Physical Therapy

## 2021-12-08 ENCOUNTER — Encounter: Payer: Self-pay | Admitting: Physical Therapy

## 2021-12-08 DIAGNOSIS — M25551 Pain in right hip: Secondary | ICD-10-CM | POA: Diagnosis not present

## 2021-12-08 DIAGNOSIS — R2689 Other abnormalities of gait and mobility: Secondary | ICD-10-CM

## 2021-12-08 DIAGNOSIS — M6281 Muscle weakness (generalized): Secondary | ICD-10-CM

## 2021-12-08 DIAGNOSIS — M25651 Stiffness of right hip, not elsewhere classified: Secondary | ICD-10-CM

## 2021-12-08 DIAGNOSIS — M79662 Pain in left lower leg: Secondary | ICD-10-CM

## 2021-12-08 DIAGNOSIS — R2681 Unsteadiness on feet: Secondary | ICD-10-CM | POA: Diagnosis not present

## 2021-12-08 NOTE — Therapy (Signed)
[place] ?[address] ?[place], [place], [address] ?Phone: [phone]   Fax:  [phone] ? ?Physical Therapy Treatment ? ?Patient Details  ?Name: Anthony Park ?MRN: [id number] ?Date of Birth: 14-Mar-1961 ?Referring Provider (PT): Kriste Basque ? ? ?Encounter Date: 12/08/2021 ? ? PT End of Session - 12/08/21 0758   ? ? Visit Number 25   ? Number of Visits 39   ? Date for PT Re-Evaluation 01/22/22   ? Authorization Type BCBS comm ppo   ? Authorization Time Period 2023 Deductible ($1,450.00)$0.00 Paid $1,450.00 to go, Out-of-Pocket Limit ($2,650.00)  $0.00 Paid $2,650.00 to go   ? Authorization - Visit Number 15   ? Authorization - Number of Visits 116   ? PT Start Time [phone]   ? PT Stop Time 0845   ? PT Time Calculation (min) 47 min   ? Activity Tolerance Patient tolerated treatment well   ? Behavior During Therapy [facility] for tasks assessed/performed   ? ?  ?  ? ?  ? ? ?Past Medical History:  ?Diagnosis Date  ? Arthritis   ? Diabetes mellitus without complication (HCC)   ? type 2 controlled with diet   ? Diabetic toe ulcer (HCC) 11/05/2018  ? History of kidney stones   ? passed 1 several years ago  ? Hyperlipidemia   ? Hypertension   ? not on medications  ? Peripheral vascular disease (HCC)   ? stent in left leg due to anerysm   ? ? ?Past Surgical History:  ?Procedure Laterality Date  ?  Left Transmetatarsal Ampuation (Left Foot)  08/20/2020  ? ABDOMINAL AORTOGRAM W/LOWER EXTREMITY N/A 08/06/2020  ? Procedure: ABDOMINAL AORTOGRAM W/LOWER EXTREMITY;  Surgeon: [name], MD;  Location: [facility] INVASIVE CV LAB;  Service: Cardiovascular;  Laterality: N/A;  ? AMPUTATION Right 11/07/2018  ? Procedure: RIGHT 3RD TOE AMPUTATION;  Surgeon: Nadara Mustard, MD;  Location: [facility] OR;  Service: Orthopedics;  Laterality: Right;  ? AMPUTATION Left 08/20/2020  ? Procedure: Left Transmetatarsal Ampuation;  Surgeon: [name], MD;  Location: [facility] OR;  Service: Vascular;  Laterality: Left;   ? AMPUTATION Left 10/03/2020  ? Procedure: LEFT BELOW KNEE AMPUTATION;  Surgeon: [name], MD;  Location: [facility] OR;  Service: Vascular;  Laterality: Left;  ? AMPUTATION TOE Left   ? big toe and one next to it  ? CATARACT EXTRACTION Bilateral   ? EYE SURGERY Bilateral   ? cataracts removed  ? INSERTION OF ILIAC STENT Left   ? PERIPHERAL VASCULAR BALLOON ANGIOPLASTY Left 08/06/2020  ? Procedure: PERIPHERAL VASCULAR BALLOON ANGIOPLASTY;  Surgeon: [name], MD;  Location: [facility] INVASIVE CV LAB;  Service: Cardiovascular;  Laterality: Left;  Peroneal artery.  ? TOE AMPUTATION Left 2015  ? TOTAL HIP ARTHROPLASTY Left 05/23/2020  ? Procedure: LEFT TOTAL HIP ARTHROPLASTY ANTERIOR APPROACH;  Surgeon: Kathryne Hitch, MD;  Location: [facility] ORS;  Service: Orthopedics;  Laterality: Left;  ? TOTAL HIP ARTHROPLASTY Right 07/31/2021  ? Procedure: RIGHT TOTAL HIP ARTHROPLASTY ANTERIOR APPROACH;  Surgeon: Kathryne Hitch, MD;  Location: [facility] ORS;  Service: Orthopedics;  Laterality: Right;  ? WOUND DEBRIDEMENT Left 09/11/2020  ? Procedure: DEBRIDEMENT OF LEFT TRANSMETATARSAL WOUND;  Surgeon: Leonie Douglas, MD;  Location: [facility] OR;  Service: Vascular;  Laterality: Left;  ? ? ?There were no vitals filed for this visit. ? ? Subjective Assessment - 12/08/21 0759   ? ? Subjective He had to put old prosthesis on limb to crawl  under his house as he did not want to mess up new prosthesis suction seal.  He has bone bruise with limb tenderness.  He saw prosthetist yesterday morning. He adjusted height, made ankle stiffer and adjusted alignment.  Pt reports that he can not tell difference.   ? Pertinent History Left TTTA, left THA (05/23/20), arthritis, DM2, HTN, HLD, PVD   ? Patient Stated Goals Wants to use prosthesis to return to some work, get back in community, be mobile, work in gym in his garage   ? Pain Score 0-No pain   ? Pain Location Hip   ? Pain Orientation Right   ? Pain Onset More than a month ago   ?  Pain Score 2   ? Pain Location Leg   residual limb  ? Pain Orientation Left;Anterior   ? Pain Descriptors / Indicators Sore   ? Pain Onset More than a month ago   ? Aggravating Factors  putting old prosthesis on limb.   ? Pain Relieving Factors tylenol   ? Pain Onset Yesterday   ? ?  ?  ? ?  ? ? ? ? ? ? ? ? ? ? ? ? ? ? ? ? ? ? ? ? [facility] Adult PT Treatment/Exercise - 12/08/21 0759   ? ?  ? Ambulation/Gait  ? Ambulation/Gait Yes   ? Ambulation/Gait Assistance 5: Supervision   ? Assistive device Prosthesis;None   ?  ? High Level Balance  ? High Level Balance Activities Braiding;Tandem walking;Marching forwards;Marching backwards   turning 360* full quarter steps CW/CCW,  3 laps or reps per activity, BUE support cues to lighten  ? High Level Balance Comments PT demo & verbal cues on technique.   ?  ? Therapeutic Activites   ? Therapeutic Activities Work Sara LeeSimulation;Lifting;Other Therapeutic Activities   ? Lifting lifting 15# kettle bell squats with prosthesis forward offset to enable ability to get lower 10 reps 2 sets.  PT demo & verbal cues on technique   ? Work Counselling psychologistimulation push / Psychiatric nursepull pulley wts (15# ea stack for 30# total) walking out & back 10 reps ea with weight posteriorly & anteriorly   ? Other Therapeutic Activities building standing / walking tolerance for work: PT recommended standing or walking (exercises, projects around home, etc.) for 15 min per hour of 8 hour work day.  Pt verbalized understanding.   ?  ? Neuro Re-ed   ? Neuro Re-ed Details  SLS balance tennis ball rolls (ant/post, med/lat & circles CW/CCW) slow then fast 10 reps ea.  stance on RLE intermittent UE touch & stance on prosthesis light BUE support.   ?  ? Knee/Hip Exercises: Machines for Strengthening  ? Total Gym Leg Press DL 086#137# [id number], Rt leg only 87# 20 reps, Lt leg only 62# 20 reps   ?  ? Knee/Hip Exercises: Standing  ? Forward Step Up Both;10 reps;Hand Hold: 2;Step Height: 6"   ? Step Down Both;10 reps;1 set;Hand Hold: 2;Step Height: 6"    ?  ? Prosthetics  ? Prosthetic Care Comments  --   ? Current prosthetic wear tolerance (days/week)  daily   ? Current prosthetic wear tolerance (#hours/day)  most of awake hours   ? Residual limb condition  no skin breakdown or redness. intermittent tenderness distal tibia & fibula   ? ?  ?  ? ?  ? ? ? ? ? ? ? ? ? ? ? ? PT Short Term Goals - 11/26/21 1427   ? ?  ?  PT SHORT TERM GOAL #1  ? Title Programmer, systems >/= [phone]   ? Time 4   ? Period Weeks   ? Status New   ? Target Date 12/24/21   ?  ? PT SHORT TERM GOAL #2  ? Title Pt negotiates stairs with single rail alternating pattern with minimal cues only.   ? Time 4   ? Period Weeks   ? Status New   ? Target Date 12/24/21   ? ?  ?  ? ?  ? ? ? ? PT Long Term Goals - 11/26/21 1431   ? ?  ? PT LONG TERM GOAL #1  ? Title He will improve FOTO functional score to 59%   ? Time 8   ? Period Weeks   ? Status On-going   ? Target Date 01/22/22   ?  ? PT LONG TERM GOAL #2  ? Title Programmer, systems >/= 52/56   ? Time 8   ? Period Weeks   ? Status Revised   ? Target Date 01/22/22   ?  ? PT LONG TERM GOAL #3  ? Title Timed Up & Go with prosthesis only <13.5sec and cognitive TUG <15.5sec to indicate lower fall risk.   ? Time 8   ? Period Weeks   ? Status New   ? Target Date 01/22/22   ?  ? PT LONG TERM GOAL #4  ? Title Functional Gait Assessment with prosthesis only >/= 19/30 to indicate lower fall risk.   ? Time 8   ? Period Weeks   ? Status New   ? Target Date 01/22/22   ?  ? PT LONG TERM GOAL #5  ? Title Patient ambulates 500' and negotiates ramps, curbs & stairs single rail alternating pattern with prosthesis only modified independent.   ? Time 8   ? Period Weeks   ? Status New   ? Target Date 01/22/22   ?  ? Additional Long Term Goals  ? Additional Long Term Goals Yes   ?  ? PT LONG TERM GOAL #6  ? Title Patient reports pain </= 2/10 in right hip & left residual limb with standing & gait activities.   ? Time 8   ? Period Weeks   ? Status Revised   ? Target Date 01/22/22   ? ?  ?  ? ?   ? ? ? ? ? ? ? ? Plan - 12/08/21 0806   ? ? Clinical Impression Statement PT began to incorporate some work simulation tasks into exercise program in PT clinic.  He was challenged by new activities.  B

## 2021-12-08 NOTE — Telephone Encounter (Signed)
Complete will pick up at office visit with [facility] tomorrow.  ?

## 2021-12-09 ENCOUNTER — Ambulatory Visit (INDEPENDENT_AMBULATORY_CARE_PROVIDER_SITE_OTHER): Payer: BC Managed Care – PPO | Admitting: Physician Assistant

## 2021-12-09 ENCOUNTER — Encounter: Payer: Self-pay | Admitting: Physician Assistant

## 2021-12-09 DIAGNOSIS — Z96641 Presence of right artificial hip joint: Secondary | ICD-10-CM

## 2021-12-09 MED ORDER — TRAMADOL HCL 50 MG PO TABS: 50.0000 mg | ORAL_TABLET | Freq: Four times a day (QID) | ORAL | 0 refills | Status: DC | PRN

## 2021-12-09 NOTE — Progress Notes (Signed)
HPI Mr. Divelbiss returns today status post right total hip arthroplasty.  He states that he has been working with physical therapy and that the workouts cause him some pain mostly lateral aspect of his hip.  He has been doing some abduction exercises.  He is taking Tylenol.  He is asking for something little stronger to take after working out.  ? ?Review of systems negative for fevers chills.  Please see HPI otherwise negative noncontributory. ? ?Physical exam: General well-developed well-nourished male in no acute distress. ?Right hip good range of motion of the right hip without pain.  Tenderness over the right trochanteric region.  Surgical incisions healed well no signs of infection or wound dehiscence.  Calf supple nontender dorsiflexion plantarflexion right ankle intact. ? ?Impression: Status post right total hip arthroplasty 07/31/2021. ? ?Plan: Discussed with him to avoid abduction exercises right hip.  Work on IT band stretching exercises shown.  The give him some tramadol to take sparingly.  He will follow-up with [place] at 6 months postop.  Questions were encouraged and answered.  We will obtain an AP pelvis lateral view of his right hip at return for the 6 months visit. ?

## 2021-12-10 ENCOUNTER — Ambulatory Visit (INDEPENDENT_AMBULATORY_CARE_PROVIDER_SITE_OTHER): Payer: BC Managed Care – PPO | Admitting: Physical Therapy

## 2021-12-10 ENCOUNTER — Other Ambulatory Visit: Payer: Self-pay

## 2021-12-10 ENCOUNTER — Encounter: Payer: Self-pay | Admitting: Physical Therapy

## 2021-12-10 DIAGNOSIS — M6281 Muscle weakness (generalized): Secondary | ICD-10-CM | POA: Diagnosis not present

## 2021-12-10 DIAGNOSIS — R2689 Other abnormalities of gait and mobility: Secondary | ICD-10-CM | POA: Diagnosis not present

## 2021-12-10 DIAGNOSIS — R2681 Unsteadiness on feet: Secondary | ICD-10-CM

## 2021-12-10 DIAGNOSIS — M25551 Pain in right hip: Secondary | ICD-10-CM | POA: Diagnosis not present

## 2021-12-10 DIAGNOSIS — M25651 Stiffness of right hip, not elsewhere classified: Secondary | ICD-10-CM

## 2021-12-10 DIAGNOSIS — M79662 Pain in left lower leg: Secondary | ICD-10-CM

## 2021-12-10 NOTE — Therapy (Signed)
[place] ?[address] ?[place], [place], [address] ?Phone: [phone]   Fax:  [phone] ? ?Physical Therapy Treatment ? ?Patient Details  ?Name: Anthony Park ?MRN: [id number] ?Date of Birth: Jul 19, 1961 ?Referring Provider (PT): Kriste Basquelark, Gill ? ? ?Encounter Date: 12/10/2021 ? ? PT End of Session - 12/10/21 0804   ? ? Visit Number 26   ? Number of Visits 39   ? Date for PT Re-Evaluation 01/22/22   ? Authorization Type BCBS comm ppo   ? Authorization Time Period 2023 Deductible ($1,450.00)$0.00 Paid $1,450.00 to go, Out-of-Pocket Limit ($2,650.00)  $0.00 Paid $2,650.00 to go   ? Authorization - Visit Number 16   ? Authorization - Number of Visits 116   ? PT Start Time 0801   ? PT Stop Time 0840   ? PT Time Calculation (min) 39 min   ? Activity Tolerance Patient tolerated treatment well   ? [facility] assessed/performed   ? ?  ?  ? ?  ? ? ?Past Medical History:  ?Diagnosis Date  ? Arthritis   ? Diabetes mellitus without complication (HCC)   ? type 2 controlled with diet   ? Diabetic toe ulcer (HCC) 11/05/2018  ? History of kidney stones   ? passed 1 several years ago  ? Hyperlipidemia   ? Hypertension   ? not on medications  ? Peripheral vascular disease (HCC)   ? stent in left leg due to anerysm   ? ? ?Past Surgical History:  ?Procedure Laterality Date  ?  Left Transmetatarsal Ampuation (Left Foot)  08/20/2020  ? ABDOMINAL AORTOGRAM W/LOWER EXTREMITY N/A 08/06/2020  ? Procedure: ABDOMINAL AORTOGRAM W/LOWER EXTREMITY;  Surgeon: Cephus Shellinglark, Christopher J, MD;  [facility];  Service: Cardiovascular;  Laterality: N/A;  ? AMPUTATION Right 11/07/2018  ? Procedure: RIGHT 3RD TOE AMPUTATION;  Surgeon: Nadara Mustarduda, Marcus V, MD;  [facility];  Service: Orthopedics;  Laterality: Right;  ? AMPUTATION Left 08/20/2020  ? Procedure: Left Transmetatarsal Ampuation;  Surgeon: Cephus Shellinglark, Christopher J, MD;  [facility];  Service: Vascular;  Laterality: Left;   ? AMPUTATION Left 10/03/2020  ? Procedure: LEFT BELOW KNEE AMPUTATION;  Surgeon: Cephus Shellinglark, Christopher J, MD;  [facility];  Service: Vascular;  Laterality: Left;  ? AMPUTATION TOE Left   ? big toe and one next to it  ? CATARACT EXTRACTION Bilateral   ? EYE SURGERY Bilateral   ? cataracts removed  ? INSERTION OF ILIAC STENT Left   ? PERIPHERAL VASCULAR BALLOON ANGIOPLASTY Left 08/06/2020  ? Procedure: PERIPHERAL VASCULAR BALLOON ANGIOPLASTY;  Surgeon: Cephus Shellinglark, Christopher J, MD;  Location: [facility] INVASIVE CV LAB;  Service: Cardiovascular;  Laterality: Left;  Peroneal artery.  ? TOE AMPUTATION Left 2015  ? TOTAL HIP ARTHROPLASTY Left 05/23/2020  ? Procedure: LEFT TOTAL HIP ARTHROPLASTY ANTERIOR APPROACH;  Surgeon: Kathryne HitchBlackman, Christopher Y, MD;  Location: [facility] OR[facility];  Service: Orthopedics;  Laterality: Left;  ? TOTAL HIP ARTHROPLASTY Right 07/31/2021  ? Procedure: RIGHT TOTAL HIP ARTHROPLASTY ANTERIOR APPROACH;  Surgeon: Kathryne HitchBlackman, Christopher Y, MD;  Location: [facility] ORS;  Service: Orthopedics;  Laterality: Right;  ? WOUND DEBRIDEMENT Left 09/11/2020  ? Procedure: DEBRIDEMENT OF LEFT TRANSMETATARSAL WOUND;  Surgeon: Leonie DouglasHawken, Thomas N, MD;  [facility];  Service: Vascular;  Laterality: Left;  ? ? ?There were no vitals filed for this visit. ? ? Subjective Assessment - 12/10/21 0801   ? ? Subjective He thinks that work out at gym may be what  is causing his lateral hip pain that he reported to PA.  He says it does not bother him all the time.   ? Pertinent History Left TTTA, left THA (05/23/20), arthritis, DM2, HTN, HLD, PVD   ? Patient Stated Goals Wants to use prosthesis to return to some work, get back in community, be mobile, work in gym in his garage   ? Currently in Pain? Yes   ? Pain Score 0-No pain   last night 2/10  ? Pain Location Hip   ? Pain Orientation Right;Lateral   ? Pain Descriptors / Indicators Aching;Sore   ? Pain Onset More than a month ago   ? Pain Frequency Intermittent   ? Aggravating Factors  gym workout,  lack of exercise   ? Pain Relieving Factors stretching, meds   ? Pain Score 2   ? Pain Location Leg   residual limb  ? Pain Orientation Left;Anterior   ? Pain Descriptors / Indicators Sore   ? Pain Type Chronic pain   ? Pain Onset More than a month ago   ? Pain Frequency Intermittent   ? Aggravating Factors  any extension inside socket   ? Pain Relieving Factors tylenol   ? Pain Onset Yesterday   ? ?  ?  ? ?  ? ? ? ? ? ? ? ? ? ? ? ? ? ? ? ? ? ? ? ? [facility] Adult PT Treatment/Exercise - 12/10/21 0801   ? ?  ? Ambulation/Gait  ? Ambulation/Gait Yes   ? Ambulation/Gait Assistance 5: Supervision   ? Assistive device Prosthesis;None   ?  ? High Level Balance  ? High Level Balance Activities Braiding;Tandem walking;Marching forwards;Marching backwards   turning 360* full quarter steps CW/CCW,  3 laps or reps per activity, BUE support cues to lighten  ? High Level Balance Comments PT demo & verbal cues on technique.   ?  ? Therapeutic Activites   ? Therapeutic Activities Work [employer];Lifting;Other Therapeutic Activities   ? Lifting lifting 15# kettle bell squats with prosthesis forward offset to enable ability to get lower 10 reps 2 sets.  PT demo & verbal cues on technique   ? Work Counselling psychologist push / Psychiatric nurse wts (15# ea stack for 30# total) walking out & back 10 reps ea with weight posteriorly & anteriorly   ? Other Therapeutic Activities building standing / walking tolerance for work: PT recommended standing or walking (exercises, projects around home, etc.) for 15 min per hour of 8 hour work day. Using timer on phone at 1 hour would help to remind him to check what he has done in last hour or need to stand.  Pt verbalized understanding.   ?  ? Neuro Re-ed   ? Neuro Re-ed Details  SLS balance tennis ball rolls (ant/post, med/lat & circles CW/CCW) slow then fast 10 reps ea.  stance on RLE intermittent UE touch & stance on prosthesis light BUE support.   ?  ? Knee/Hip Exercises: Machines for Strengthening  ? Total Gym  Leg Press DL 510# 25ENID, Rt leg only 87# 20 reps, Lt leg only 62# 20 reps   ?  ? Knee/Hip Exercises: Standing  ? Forward Step Up Both;10 reps;Hand Hold: 2;Step Height: 6"   ? Step Down Both;10 reps;1 set;Hand Hold: 2;Step Height: 6"   ?  ? Prosthetics  ? Prosthetic Care Comments  PT demo & verbal cues on proper technique to donne socks with new socket system. pt verbalized understanding.   ?  Current prosthetic wear tolerance (days/week)  daily   ? Current prosthetic wear tolerance (#hours/day)  most of awake hours   ? Residual limb condition  no skin breakdown or redness. intermittent tenderness distal tibia & fibula   ? ?  ?  ? ?  ? ? ? ? ? ? ? ? ? ? PT Education - 12/10/21 0818   ? ? Education Details anatomy of lateral hip, ITB & hip abduction with ext rot can cause greater trochanteric area pain.  PT demo proper hip abduction motions without ext rot   ? Person(s) Educated Patient   ? Methods Explanation;Demonstration;Verbal cues;Other (comment)   internet for images  ? Comprehension Verbalized understanding   ? ?  ?  ? ?  ? ? ? PT Short Term Goals - 11/26/21 1427   ? ?  ? PT SHORT TERM GOAL #1  ? Title Programmer, systems >/= [phone]   ? Time 4   ? Period Weeks   ? Status New   ? Target Date 12/24/21   ?  ? PT SHORT TERM GOAL #2  ? Title Pt negotiates stairs with single rail alternating pattern with minimal cues only.   ? Time 4   ? Period Weeks   ? Status New   ? Target Date 12/24/21   ? ?  ?  ? ?  ? ? ? ? PT Long Term Goals - 11/26/21 1431   ? ?  ? PT LONG TERM GOAL #1  ? Title He will improve FOTO functional score to 59%   ? Time 8   ? Period Weeks   ? Status On-going   ? Target Date 01/22/22   ?  ? PT LONG TERM GOAL #2  ? Title Programmer, systems >/= 52/56   ? Time 8   ? Period Weeks   ? Status Revised   ? Target Date 01/22/22   ?  ? PT LONG TERM GOAL #3  ? Title Timed Up & Go with prosthesis only <13.5sec and cognitive TUG <15.5sec to indicate lower fall risk.   ? Time 8   ? Period Weeks   ? Status New   ? Target Date  01/22/22   ?  ? PT LONG TERM GOAL #4  ? Title Functional Gait Assessment with prosthesis only >/= 19/30 to indicate lower fall risk.   ? Time 8   ? Period Weeks   ? Status New   ? Target Date 01/22/22   ?  ?

## 2021-12-15 ENCOUNTER — Ambulatory Visit (INDEPENDENT_AMBULATORY_CARE_PROVIDER_SITE_OTHER): Payer: BC Managed Care – PPO | Admitting: Physical Therapy

## 2021-12-15 ENCOUNTER — Encounter: Payer: Self-pay | Admitting: Physical Therapy

## 2021-12-15 ENCOUNTER — Other Ambulatory Visit: Payer: Self-pay

## 2021-12-15 DIAGNOSIS — M25551 Pain in right hip: Secondary | ICD-10-CM | POA: Diagnosis not present

## 2021-12-15 DIAGNOSIS — M6281 Muscle weakness (generalized): Secondary | ICD-10-CM

## 2021-12-15 DIAGNOSIS — M79662 Pain in left lower leg: Secondary | ICD-10-CM

## 2021-12-15 DIAGNOSIS — R2689 Other abnormalities of gait and mobility: Secondary | ICD-10-CM

## 2021-12-15 DIAGNOSIS — R2681 Unsteadiness on feet: Secondary | ICD-10-CM | POA: Diagnosis not present

## 2021-12-15 DIAGNOSIS — M25651 Stiffness of right hip, not elsewhere classified: Secondary | ICD-10-CM

## 2021-12-15 NOTE — Therapy (Addendum)
[place] [facility] ?[address] ?[place], [place], [address] ?Phone: [phone]   Fax:  [phone] ? ?Physical Therapy Treatment ? ?Patient Details  ?Name: Anthony Park ?MRN: [id number] ?Date of Birth: 06/08/1961 ?Referring Provider (PT): Kriste Basque ? ? ?Encounter Date: 12/15/2021 ? ? PT End of Session - 12/15/21 0804   ? ? Visit Number 27   ? Number of Visits 39   ? Date for PT Re-Evaluation 01/22/22   ? Authorization Type BCBS comm ppo   ? Authorization Time Period 2023 Deductible ($1,450.00)$0.00 Paid $1,450.00 to go, Out-of-Pocket Limit ($2,650.00)  $0.00 Paid $2,650.00 to go   ? Authorization - Visit Number 17   ? Authorization - Number of Visits 116   ? PT Start Time 0800   ? PT End Time 0845  ? Activity Tolerance Patient tolerated treatment well   ? Behavior During Therapy [facility] for tasks assessed/performed   ? ?  ?  ? ?  ? ? ?Past Medical History:  ?Diagnosis Date  ? Arthritis   ? Diabetes mellitus without complication (HCC)   ? type 2 controlled with diet   ? Diabetic toe ulcer (HCC) 11/05/2018  ? History of kidney stones   ? passed 1 several years ago  ? Hyperlipidemia   ? Hypertension   ? not on medications  ? Peripheral vascular disease (HCC)   ? stent in left leg due to anerysm   ? ? ?Past Surgical History:  ?Procedure Laterality Date  ?  Left Transmetatarsal Ampuation (Left Foot)  08/20/2020  ? ABDOMINAL AORTOGRAM W/LOWER EXTREMITY N/A 08/06/2020  ? Procedure: ABDOMINAL AORTOGRAM W/LOWER EXTREMITY;  Surgeon: [name], MD;  Location: [facility] INVASIVE CV LAB;  Service: Cardiovascular;  Laterality: N/A;  ? AMPUTATION Right 11/07/2018  ? Procedure: RIGHT 3RD TOE AMPUTATION;  Surgeon: Nadara Mustard, MD;  Location: [facility] OR;  Service: Orthopedics;  Laterality: Right;  ? AMPUTATION Left 08/20/2020  ? Procedure: Left Transmetatarsal Ampuation;  Surgeon: [name], MD;  Location: [facility] OR;  Service: Vascular;  Laterality: Left;  ? AMPUTATION Left 10/03/2020  ?  Procedure: LEFT BELOW KNEE AMPUTATION;  Surgeon: [name], MD;  Location: [facility] OR;  Service: Vascular;  Laterality: Left;  ? AMPUTATION TOE Left   ? big toe and one next to it  ? CATARACT EXTRACTION Bilateral   ? EYE SURGERY Bilateral   ? cataracts removed  ? INSERTION OF ILIAC STENT Left   ? PERIPHERAL VASCULAR BALLOON ANGIOPLASTY Left 08/06/2020  ? Procedure: PERIPHERAL VASCULAR BALLOON ANGIOPLASTY;  Surgeon: [name], MD;  Location: [facility] INVASIVE CV LAB;  Service: Cardiovascular;  Laterality: Left;  Peroneal artery.  ? TOE AMPUTATION Left 2015  ? TOTAL HIP ARTHROPLASTY Left 05/23/2020  ? Procedure: LEFT TOTAL HIP ARTHROPLASTY ANTERIOR APPROACH;  Surgeon: Kathryne Hitch, MD;  Location: [facility] ORS;  Service: Orthopedics;  Laterality: Left;  ? TOTAL HIP ARTHROPLASTY Right 07/31/2021  ? Procedure: RIGHT TOTAL HIP ARTHROPLASTY ANTERIOR APPROACH;  Surgeon: Kathryne Hitch, MD;  Location: [facility] ORS;  Service: Orthopedics;  Laterality: Right;  ? WOUND DEBRIDEMENT Left 09/11/2020  ? Procedure: DEBRIDEMENT OF LEFT TRANSMETATARSAL WOUND;  Surgeon: Leonie Douglas, MD;  Location: [facility] OR;  Service: Vascular;  Laterality: Left;  ? ? ?There were no vitals filed for this visit. ? ? Subjective Assessment - 12/15/21 0800   ? ? Subjective He is working on standing / walking each hour >15 min.   ? Pertinent History Left TTTA, left THA (  05/23/20), arthritis, DM2, HTN, HLD, PVD   ? Patient Stated Goals Wants to use prosthesis to return to some work, get back in community, be mobile, work in gym in his garage   ? Pain Score 0-No pain   in last week up to 5/10  ? Pain Location Hip   ? Pain Orientation Right;Lateral   ? Pain Onset More than a month ago   ? Pain Frequency Intermittent   ? Aggravating Factors  gym workout   ? Pain Relieving Factors stretching, meds   ? Pain Score 1   ? Pain Location Leg   ? Pain Orientation Left;Anterior   ? Pain Descriptors / Indicators Tender   ? Pain Onset More than a month  ago   ? Pain Frequency Intermittent   ? Aggravating Factors  any extension inside socket   ? Pain Relieving Factors tylenol   ? Pain Onset Yesterday   ? ?  ?  ? ?  ? ? ? ? ? ? ? ? ? ? ? ? ? ? ? ? ? ? ? ? [facility] Adult PT Treatment/Exercise - 12/15/21 0800   ? ?  ? Ambulation/Gait  ? Ambulation/Gait Yes   ? Ambulation/Gait Assistance 5: Supervision   ? Assistive device Prosthesis;None   ?  ? High Level Balance  ? High Level Balance Activities Braiding;Tandem walking;Marching forwards;Marching backwards   turning 360* full quarter steps CW/CCW,  3 laps or reps per activity, BUE support cues to lighten  ? High Level Balance Comments PT demo & verbal cues on technique.   ?  ? Therapeutic Activites   ? Therapeutic Activities Work [employer];Lifting;Other Therapeutic Activities   ? Lifting lifting 15# kettle bell squats with prosthesis forward offset to enable ability to get lower 10 reps 2 sets.  PT demo & verbal cues on technique   ? Work Counselling psychologist push / Psychiatric nurse wts (15# ea stack for 30# total) walking out & back 10 reps ea with weight posteriorly & anteriorly   ? Other Therapeutic Activities continue building standing / walking tolerance for work: PT recommended standing or walking (exercises, projects around home, etc.) for 20-30 min per hour of 8 hour work day. consistency would help more than large fluctutations.  Using timer on phone at 1 hour would help to remind him to check what he has done in last hour or need to stand.  Pt verbalized understanding.   ?  ? Neuro Re-ed   ? Neuro Re-ed Details  SLS on foam beam tapping contralateral LE on 3 cones (lat, ant, across midline) alternating stance LE 10 reps ea LE BUE support.  working on stabilization on uneven terrain.   ?  ? Knee/Hip Exercises: Machines for Strengthening  ? Total Gym Leg Press DL 628# [id number], Rt leg only 87# 20 reps, Lt leg only 62# 20 reps   ?  ? Knee/Hip Exercises: Standing  ? Forward Step Up Both;10 reps;Hand Hold: 2;Step Height: 6"   ? Step  Down Both;10 reps;1 set;Hand Hold: 2;Step Height: 6"   ?  ? Prosthetics  ? Prosthetic Care Comments  --   ? Current prosthetic wear tolerance (days/week)  daily   ? Current prosthetic wear tolerance (#hours/day)  most of awake hours   ? Residual limb condition  no skin breakdown or redness. intermittent tenderness distal tibia & fibula   ? ?  ?  ? ?  ? ? ? ? ? ? ? ? ? ? PT Education - 12/15/21  [id number]   ? ? Education Details Shoes are foundation for support and have a time / use limit. PT recommended [employer] shoe assessment.   ? Person(s) Educated Patient   ? Methods Explanation;Verbal cues   ? Comprehension Verbalized understanding   ? ?  ?  ? ?  ? ? ? PT Short Term Goals - 11/26/21 1427   ? ?  ? PT SHORT TERM GOAL #1  ? Title Programmer, systemsBerg Balance >/= [phone]   ? Time 4   ? Period Weeks   ? Status New   ? Target Date 12/24/21   ?  ? PT SHORT TERM GOAL #2  ? Title Pt negotiates stairs with single rail alternating pattern with minimal cues only.   ? Time 4   ? Period Weeks   ? Status New   ? Target Date 12/24/21   ? ?  ?  ? ?  ? ? ? ? PT Long Term Goals - 11/26/21 1431   ? ?  ? PT LONG TERM GOAL #1  ? Title He will improve FOTO functional score to 59%   ? Time 8   ? Period Weeks   ? Status On-going   ? Target Date 01/22/22   ?  ? PT LONG TERM GOAL #2  ? Title Programmer, systemsBerg Balance >/= 52/56   ? Time 8   ? Period Weeks   ? Status Revised   ? Target Date 01/22/22   ?  ? PT LONG TERM GOAL #3  ? Title Timed Up & Go with prosthesis only <13.5sec and cognitive TUG <15.5sec to indicate lower fall risk.   ? Time 8   ? Period Weeks   ? Status New   ? Target Date 01/22/22   ?  ? PT LONG TERM GOAL #4  ? Title Functional Gait Assessment with prosthesis only >/= 19/30 to indicate lower fall risk.   ? Time 8   ? Period Weeks   ? Status New   ? Target Date 01/22/22   ?  ? PT LONG TERM GOAL #5  ? Title Patient ambulates 500' and negotiates ramps, curbs & stairs single rail alternating pattern with prosthesis only modified independent.   ? Time 8   ?  Period Weeks   ? Status New   ? Target Date 01/22/22   ?  ? Additional Long Term Goals  ? Additional Long Term Goals Yes   ?  ? PT LONG TERM GOAL #6  ? Title Patient reports pain </= 2/10 in right hip & left re

## 2021-12-17 ENCOUNTER — Ambulatory Visit (INDEPENDENT_AMBULATORY_CARE_PROVIDER_SITE_OTHER): Payer: BC Managed Care – PPO | Admitting: Physical Therapy

## 2021-12-17 ENCOUNTER — Other Ambulatory Visit: Payer: Self-pay

## 2021-12-17 ENCOUNTER — Encounter: Payer: Self-pay | Admitting: Physical Therapy

## 2021-12-17 DIAGNOSIS — M6281 Muscle weakness (generalized): Secondary | ICD-10-CM

## 2021-12-17 DIAGNOSIS — M25551 Pain in right hip: Secondary | ICD-10-CM | POA: Diagnosis not present

## 2021-12-17 DIAGNOSIS — R2681 Unsteadiness on feet: Secondary | ICD-10-CM

## 2021-12-17 DIAGNOSIS — M25651 Stiffness of right hip, not elsewhere classified: Secondary | ICD-10-CM

## 2021-12-17 DIAGNOSIS — R2689 Other abnormalities of gait and mobility: Secondary | ICD-10-CM

## 2021-12-17 DIAGNOSIS — M79662 Pain in left lower leg: Secondary | ICD-10-CM

## 2021-12-17 NOTE — Therapy (Signed)
[place] ?[facility] ?[address] ?[place], [place], [address] ?Phone: [phone]   Fax:  [phone] ? ?Physical Therapy Treatment ? ?Patient Details  ?Name: Anthony Park ?MRN: [id number] ?Date of Birth: [birth year] ?Referring Provider (PT): Kriste Basque ? ? ?Encounter Date: 12/17/2021 ? ? PT End of Session - 12/17/21 0804   ? ? Visit Number 28   ? Number of Visits 39   ? Date for PT Re-Evaluation 01/22/22   ? Authorization Type BCBS comm ppo   ? Authorization Time Period 2023 Deductible ($1,450.00)$0.00 Paid $1,450.00 to go, Out-of-Pocket Limit ($2,650.00)  $0.00 Paid $2,650.00 to go   ? Authorization - Visit Number 17   ? Authorization - Number of Visits 116   ? PT Start Time 0800   ? PT Stop Time 0844   ? PT Time Calculation (min) 44 min   ? Activity Tolerance Patient tolerated treatment well   ? Behavior During Therapy [facility] for tasks assessed/performed   ? ?  ?  ? ?  ? ? ?Past Medical History:  ?Diagnosis Date  ? Arthritis   ? Diabetes mellitus without complication (HCC)   ? type 2 controlled with diet   ? Diabetic toe ulcer (HCC) 11/05/2018  ? History of kidney stones   ? passed 1 several years ago  ? Hyperlipidemia   ? Hypertension   ? not on medications  ? Peripheral vascular disease (HCC)   ? stent in left leg due to anerysm   ? ? ?Past Surgical History:  ?Procedure Laterality Date  ?  Left Transmetatarsal Ampuation (Left Foot)  08/20/2020  ? ABDOMINAL AORTOGRAM W/LOWER EXTREMITY N/A 08/06/2020  ? Procedure: ABDOMINAL AORTOGRAM W/LOWER EXTREMITY;  Surgeon: [name], MD;  Location: [facility] INVASIVE CV LAB;  Service: Cardiovascular;  Laterality: N/A;  ? AMPUTATION Right 11/07/2018  ? Procedure: RIGHT 3RD TOE AMPUTATION;  Surgeon: Nadara Mustard, MD;  Location: [facility] OR;  Service: Orthopedics;  Laterality: Right;  ? AMPUTATION Left 08/20/2020  ? Procedure: Left Transmetatarsal Ampuation;  Surgeon: [name], MD;  Location: [facility] OR;  Service: Vascular;  Laterality: Left;  ?  AMPUTATION Left 10/03/2020  ? Procedure: LEFT BELOW KNEE AMPUTATION;  Surgeon: [name], MD;  Location: [facility] OR;  Service: Vascular;  Laterality: Left;  ? AMPUTATION TOE Left   ? big toe and one next to it  ? CATARACT EXTRACTION Bilateral   ? EYE SURGERY Bilateral   ? cataracts removed  ? INSERTION OF ILIAC STENT Left   ? PERIPHERAL VASCULAR BALLOON ANGIOPLASTY Left 08/06/2020  ? Procedure: PERIPHERAL VASCULAR BALLOON ANGIOPLASTY;  Surgeon: [name], MD;  Location: [facility] INVASIVE CV LAB;  Service: Cardiovascular;  Laterality: Left;  Peroneal artery.  ? TOE AMPUTATION Left 2015  ? TOTAL HIP ARTHROPLASTY Left 05/23/2020  ? Procedure: LEFT TOTAL HIP ARTHROPLASTY ANTERIOR APPROACH;  Surgeon: Kathryne Hitch, MD;  Location: [facility] ORS;  Service: Orthopedics;  Laterality: Left;  ? TOTAL HIP ARTHROPLASTY Right 07/31/2021  ? Procedure: RIGHT TOTAL HIP ARTHROPLASTY ANTERIOR APPROACH;  Surgeon: Kathryne Hitch, MD;  Location: [facility] ORS;  Service: Orthopedics;  Laterality: Right;  ? WOUND DEBRIDEMENT Left 09/11/2020  ? Procedure: DEBRIDEMENT OF LEFT TRANSMETATARSAL WOUND;  Surgeon: Leonie Douglas, MD;  Location: [facility] OR;  Service: Vascular;  Laterality: Left;  ? ? ?There were no vitals filed for this visit. ? ? Subjective Assessment - 12/17/21 0804   ? ? Subjective He plans to work on consistency with standing & walking  per hour.   ? Pertinent History Left TTTA, left THA (05/23/20), arthritis, DM2, HTN, HLD, PVD   ? Patient Stated Goals Wants to use prosthesis to return to some work, get back in community, be mobile, work in gym in his garage   ? Currently in Pain? Yes   ? Pain Score 0-No pain   since last PT no hip pain  ? Pain Location Hip   ? Pain Orientation Right;Lateral   ? Pain Onset More than a month ago   ? Pain Score 0   at end of day 1/10 soreness noted  ? Pain Location Leg   residual limb  ? Pain Orientation Left;Anterior   ? Pain Onset More than a month ago   ? Pain Onset Yesterday   ? ?   ?  ? ?  ? ? ? ? ? ? ? ? ? ? ? ? ? ? ? ? ? ? ? ? [facility] Adult PT Treatment/Exercise - 12/17/21 0800   ? ?  ? Ambulation/Gait  ? Ambulation/Gait Yes   ? Ambulation/Gait Assistance 5: Supervision   ? Assistive device Prosthesis;None   ?  ? High Level Balance  ? High Level Balance Activities Braiding;Tandem walking;Side stepping;Marching forwards;Marching backwards   tandem & sidestep on foam beam;  braiding & marching on floor  ? High Level Balance Comments PT demo & verbal cues on technique.   ?  ? Therapeutic Activites   ? Therapeutic Activities Work [employer] LeeSimulation;Lifting;Other Therapeutic Activities   ? Lifting lifting 15# kettle bell squats with prosthesis forward offset to enable ability to get lower 10 reps 2 sets.  PT demo & verbal cues on technique   ? Work Counselling psychologistimulation push / Psychiatric nursepull pulley wts (15# ea stack for 30# total) walking out & back 10 reps ea with weight posteriorly & anteriorly   ? Other Therapeutic Activities continue building standing / walking tolerance for work: PT recommended standing or walking (exercises, projects around home, etc.) for 20-30 min per hour of 8 hour work day. consistency would help more than large fluctutations.  Using timer on phone at 1 hour would help to remind him to check what he has done in last hour or need to stand.  Pt verbalized understanding.   ?  ? Neuro Re-ed   ? Neuro Re-ed Details  SLS on BOSU round side up reaching contralateral LE max distance for abd, across midline, ant & post -  alternating stance LE 10 reps ea LE BUE support. PT demo & verbal cues on techique & rationale.  working on stabilization on uneven terrain.   ?  ? Knee/Hip Exercises: Machines for Strengthening  ? Total Gym Leg Press DL 829#143# [id number], Rt leg only 87# 20 reps, Lt leg only 68# 20 reps   ?  ? Knee/Hip Exercises: Standing  ? Forward Step Up Both;10 reps;Hand Hold: 2;Step Height: 8"   BOSU round side up  ? Forward Step Up Limitations 2 step lead to BOSU & 2 step away from BOSU to work on power  along with strength   ? Step Down Both;10 reps;1 set;Hand Hold: 2;Step Height: 8"   BOSU round side up  ? Step Down Limitations 2 step lead to BOSU & 2 step away from BOSU to work on power along with strength   ?  ? Prosthetics  ? Prosthetic Care Comments  PT recommended trial using cut-off socks & full socks with window when he needs a sock to see if it helps  what he reports feeling. pt verbalized understanding.   ? Current prosthetic wear tolerance (days/week)  daily   ? Current prosthetic wear tolerance (#hours/day)  most of awake hours   ? Residual limb condition  no skin breakdown or redness. intermittent tenderness distal tibia & fibula   ? ?  ?  ? ?  ? ? ? ? ? ? ? ? ? ? ? ? PT Short Term Goals - 11/26/21 1427   ? ?  ? PT SHORT TERM GOAL #1  ? Title Programmer, systems >/= [phone]   ? Time 4   ? Period Weeks   ? Status New   ? Target Date 12/24/21   ?  ? PT SHORT TERM GOAL #2  ? Title Pt negotiates stairs with single rail alternating pattern with minimal cues only.   ? Time 4   ? Period Weeks   ? Status New   ? Target Date 12/24/21   ? ?  ?  ? ?  ? ? ? ? PT Long Term Goals - 11/26/21 1431   ? ?  ? PT LONG TERM GOAL #1  ? Title He will improve FOTO functional score to 59%   ? Time 8   ? Period Weeks   ? Status On-going   ? Target Date 01/22/22   ?  ? PT LONG TERM GOAL #2  ? Title Programmer, systems >/= 52/56   ? Time 8   ? Period Weeks   ? Status Revised   ? Target Date 01/22/22   ?  ? PT LONG TERM GOAL #3  ? Title Timed Up & Go with prosthesis only <13.5sec and cognitive TUG <15.5sec to indicate lower fall risk.   ? Time 8   ? Period Weeks   ? Status New   ? Target Date 01/22/22   ?  ? PT LONG TERM GOAL #4  ? Title Functional Gait Assessment with prosthesis only >/= 19/30 to indicate lower fall risk.   ? Time 8   ? Period Weeks   ? Status New   ? Target Date 01/22/22   ?  ? PT LONG TERM GOAL #5  ? Title Patient ambulates 500' and negotiates ramps, curbs & stairs single rail alternating pattern with prosthesis only modified  independent.   ? Time 8   ? Period Weeks   ? Status New   ? Target Date 01/22/22   ?  ? Additional Long Term Goals  ? Additional Long Term Goals Yes   ?  ? PT LONG TERM GOAL #6  ? Title Patient reports

## 2021-12-22 ENCOUNTER — Encounter: Payer: BC Managed Care – PPO | Admitting: Physical Therapy

## 2021-12-24 ENCOUNTER — Ambulatory Visit (INDEPENDENT_AMBULATORY_CARE_PROVIDER_SITE_OTHER): Payer: BC Managed Care – PPO | Admitting: Physical Therapy

## 2021-12-24 ENCOUNTER — Other Ambulatory Visit: Payer: Self-pay

## 2021-12-24 ENCOUNTER — Encounter: Payer: Self-pay | Admitting: Physical Therapy

## 2021-12-24 DIAGNOSIS — M6281 Muscle weakness (generalized): Secondary | ICD-10-CM

## 2021-12-24 DIAGNOSIS — M25651 Stiffness of right hip, not elsewhere classified: Secondary | ICD-10-CM

## 2021-12-24 DIAGNOSIS — R2689 Other abnormalities of gait and mobility: Secondary | ICD-10-CM | POA: Diagnosis not present

## 2021-12-24 DIAGNOSIS — R2681 Unsteadiness on feet: Secondary | ICD-10-CM | POA: Diagnosis not present

## 2021-12-24 DIAGNOSIS — M25551 Pain in right hip: Secondary | ICD-10-CM

## 2021-12-24 DIAGNOSIS — M79662 Pain in left lower leg: Secondary | ICD-10-CM

## 2021-12-24 NOTE — Therapy (Signed)
[place] [facility] ?[address] ?[place], [place], [id number] ?Phone: [phone]   Fax:  [phone] ? ?Physical Therapy Treatment ? ?Patient Details  ?Name: Anthony StageGlenn A Chappuis ?MRN: [id number] ?Date of Birth: [birth year] ?Referring Provider (PT): Anthony Park, Gill ? ? ?Encounter Date: 12/24/2021 ? ? PT End of Session - 12/24/21 0804   ? ? Visit Number 29   ? Number of Visits 39   ? Date for PT Re-Evaluation 01/22/22   ? Authorization Type BCBS comm ppo   ? Authorization Time Period 2023 Deductible ($1,450.00)$0.00 Paid $1,450.00 to go, Out-of-Pocket Limit ($2,650.00)  $0.00 Paid $2,650.00 to go   ? Authorization - Visit Number 18   ? Authorization - Number of Visits 116   ? PT Start Time 0802   ? PT Stop Time 0840   ? PT Time Calculation (min) 38 min   ? Activity Tolerance Patient tolerated treatment well   ? Behavior [facility] assessed/performed   ? ?  ?  ? ?  ? ? ?Past Medical History:  ?Diagnosis Date  ? Arthritis   ? Diabetes mellitus without complication (HCC)   ? type 2 controlled with diet   ? Diabetic toe ulcer (HCC) 11/05/2018  ? History of kidney stones   ? passed 1 several years ago  ? Hyperlipidemia   ? Hypertension   ? not on medications  ? Peripheral vascular disease (HCC)   ? stent in left leg due to anerysm   ? ? ?Past Surgical History:  ?Procedure Laterality Date  ?  Left Transmetatarsal Ampuation (Left Foot)  08/20/2020  ? ABDOMINAL AORTOGRAM W/LOWER EXTREMITY Park/A 08/06/2020  ? Procedure: ABDOMINAL AORTOGRAM W/LOWER EXTREMITY;  Surgeon: Anthony Park, Anthony J, MD;  [facility];  Service: Cardiovascular;  Laterality: Park/A;  ? AMPUTATION Right 11/07/2018  ? Procedure: RIGHT 3RD TOE AMPUTATION;  Surgeon: Nadara Mustarduda, Anthony V, MD;  [facility];  Service: Orthopedics;  Laterality: Right;  ? AMPUTATION Left 08/20/2020  ? Procedure: Left Transmetatarsal Ampuation;  Surgeon: Anthony Park, Anthony J, MD;  [facility];  Service: Vascular;  Laterality: Left;   ? AMPUTATION Left 10/03/2020  ? Procedure: LEFT BELOW KNEE AMPUTATION;  Surgeon: Anthony Park, Anthony J, MD;  [facility];  Service: Vascular;  Laterality: Left;  ? AMPUTATION TOE Left   ? big toe and one next to it  ? CATARACT EXTRACTION Bilateral   ? EYE SURGERY Bilateral   ? cataracts removed  ? INSERTION OF ILIAC STENT Left   ? PERIPHERAL VASCULAR BALLOON ANGIOPLASTY Left 08/06/2020  ? Procedure: PERIPHERAL VASCULAR BALLOON ANGIOPLASTY;  Surgeon: Anthony Park, Anthony J, MD;  [facility]: [facility]AS[facility] CV LAB;  Service: Cardiovascular;  Laterality: Left;  Peroneal artery.  ? TOE AMPUTATION Left 2015  ? TOTAL HIP ARTHROPLASTY Left 05/23/2020  ? Procedure: LEFT TOTAL HIP ARTHROPLASTY ANTERIOR APPROACH;  Surgeon: Anthony Park, Anthony Y, MD;  Location: [facility] ORS;  Service: Orthopedics;  Laterality: Left;  ? TOTAL HIP ARTHROPLASTY Right 07/31/2021  ? Procedure: RIGHT TOTAL HIP ARTHROPLASTY ANTERIOR APPROACH;  Surgeon: Anthony Park, Anthony Y, MD;  Location: [facility] ORS;  Service: Orthopedics;  Laterality: Right;  ? WOUND DEBRIDEMENT Left 09/11/2020  ? Procedure: DEBRIDEMENT OF LEFT TRANSMETATARSAL WOUND;  Surgeon: Leonie Park, Anthony N, MD;  [facility];  Service: Vascular;  Laterality: Left;  ? ? ?There were no vitals filed for this visit. ? ? Subjective Assessment - 12/24/21 0802   ? ? Subjective He missed first PT appt this week to scheduling conflict.  He is standing ~30 min of ea hour for 8 hours with little fatigue LLE & no fatigue RLE.   ? Pertinent History Left TTTA, left THA (05/23/20), arthritis, DM2, HTN, HLD, PVD   ? Patient Stated Goals Wants to use prosthesis to return to some work, get back in community, be mobile, work in gym in his garage   ? Currently in Pain? Yes   ? Pain Score 1    ? Pain Location Hip   ? Pain Orientation Right;Lateral   ? Pain Descriptors / Indicators Sore   ? Pain Type Chronic pain   ? Pain Onset More than a month ago   ? Pain Frequency Intermittent   ? Aggravating Factors  first thing in  morning   ? Pain Relieving Factors moving   ? Pain Score 2   ? Pain Location Leg   residual limb  ? Pain Orientation Left;Anterior   ? Pain Descriptors / Indicators Sore   ? Pain Onset More than a month ago   ? Pain Frequency Intermittent   ? Aggravating Factors  increased standing   ? Pain Relieving Factors tylenol   ? Pain Onset Yesterday   ? ?  ?  ? ?  ? ? ? ? ? [facility] PT Assessment - 12/24/21 0802   ? ?  ? Assessment  ? Medical Diagnosis Rt THA (anterior approach)   hx of LT TTA  ? Referring Provider (PT) Anthony Basque   ? Onset Date/Surgical Date 07/31/21   ?  ? Ambulation/Gait  ? Stairs Yes   ? Stairs Assistance 5: Supervision   ? Stairs Assistance Details (indicate cue type and reason) min cues for prosthetic foot placement to enable knee flexion descending   ? Stair Management Technique Two rails;One rail Right;Alternating pattern;Forwards   ascend single rail & descend 2 rails  ? Number of Stairs 11   ? Height of Stairs 7   ?  ? Berg Balance Test  ? Sit to Stand Able to stand without using hands and stabilize independently   ? Standing Unsupported Able to stand safely 2 minutes   ? Sitting with Back Unsupported but Feet Supported on Floor or Stool Able to sit safely and securely 2 minutes   ? Stand to Sit Sits safely with minimal use of hands   ? Transfers Able to transfer safely, minor use of hands   ? Standing Unsupported with Eyes Closed Able to stand 10 seconds safely   ? Standing Unsupported with Feet Together Able to place feet together independently and stand 1 minute safely   ? From Standing, Reach Forward with Outstretched Arm Can reach confidently >25 cm (10")   ? From Standing Position, Pick up Object from Floor Able to pick up shoe safely and easily   ? From Standing Position, Turn to Look Behind Over each Shoulder Looks behind from both sides and weight shifts well   ? Turn 360 Degrees Able to turn 360 degrees safely in 4 seconds or less   ? Standing Unsupported, Alternately Place Feet on  Step/Stool Able to stand independently and safely and complete 8 steps in 20 seconds   ? Standing Unsupported, One Foot in Front Able to plae foot ahead of the other independently and hold 30 seconds   ? Standing on One Leg Tries to lift leg/unable to hold 3 seconds but remains standing independently   ? Total Score 52   ? Berg comment: on 11/26/2021 Sharlene Motts 46/56   ? ?  ?  ? ?  ? ? ? ? ? ? ? ? ? ? ? ? ? ? ? ?  [facility] Adult PT Treatment/Exercise - 12/24/21 0802   ? ?  ? Ambulation/Gait  ? Ambulation/Gait Yes   ? Ambulation/Gait Assistance 5: Supervision   ? Assistive device Prosthesis;None   ?  ? High Level Balance  ? High Level Balance Activities Braiding;Tandem walking;Side stepping;Marching forwards;Marching backwards   tandem & sidestep on foam beam;  braiding & marching on floor  ? High Level Balance Comments PT demo & verbal cues on technique.   ?  ? Therapeutic Activites   ? Therapeutic Activities Work [employer];Lifting;Other Therapeutic Activities   ? Lifting lifting 15# kettle bell squats with prosthesis forward offset to enable ability to get lower 10 reps 2 sets.  PT demo & verbal cues on technique   ? Work Counselling psychologist push / Psychiatric nurse wts (15# ea stack for 30# total) walking out & back 10 reps ea with weight posteriorly & anteriorly   ? Other Therapeutic Activities continue building standing / walking tolerance for work: PT recommended standing or walking (exercises, projects around home, etc.) for 30 min per hour of 8 hour work day. consistency would help more than large fluctutations.  Using timer on phone at 1 hour would help to remind him to check what he has done in last hour or need to stand.  Pt verbalized understanding.   ?  ? Neuro Re-ed   ? Neuro Re-ed Details  SLS on BOSU round side up reaching contralateral LE max distance for abd, across midline, ant & post -  alternating stance LE 10 reps ea LE BUE support. PT demo & verbal cues on techique & rationale.  working on stabilization on uneven  terrain.   ?  ? Knee/Hip Exercises: Machines for Strengthening  ? Total Gym Leg Press DL 625# 63SLHT, Rt leg only 87# 20 reps, Lt leg only 75# 20 reps   ?  ? Knee/Hip Exercises: Standing  ? Forward Step Up Both;10 reps;

## 2021-12-29 ENCOUNTER — Ambulatory Visit (INDEPENDENT_AMBULATORY_CARE_PROVIDER_SITE_OTHER): Payer: BC Managed Care – PPO | Admitting: Physical Therapy

## 2021-12-29 ENCOUNTER — Encounter: Payer: Self-pay | Admitting: Physical Therapy

## 2021-12-29 DIAGNOSIS — M25551 Pain in right hip: Secondary | ICD-10-CM

## 2021-12-29 DIAGNOSIS — R2681 Unsteadiness on feet: Secondary | ICD-10-CM

## 2021-12-29 DIAGNOSIS — M6281 Muscle weakness (generalized): Secondary | ICD-10-CM

## 2021-12-29 DIAGNOSIS — M79662 Pain in left lower leg: Secondary | ICD-10-CM

## 2021-12-29 DIAGNOSIS — R2689 Other abnormalities of gait and mobility: Secondary | ICD-10-CM

## 2021-12-29 NOTE — Therapy (Signed)
[place] [facility] ?[address] ?[place], [place], [address] ?Phone: [phone]   Fax:  [phone] ? ?Physical Therapy Treatment ? ?Patient Details  ?Name: Anthony Park ?MRN: [id number] ?Date of Birth: 1960-12-01 ?Referring Provider (PT): Carney Bern ? ? ?Encounter Date: 12/29/2021 ? ? PT End of Session - 12/29/21 7858   ? ? Visit Number 30   ? Number of Visits 39   ? Date for PT Re-Evaluation 01/22/22   ? Authorization Type BCBS comm ppo   ? Authorization Time Period 2023 Deductible ($1,450.00)$0.00 Paid $1,450.00 to go, Out-of-Pocket Limit ($2,650.00)  $0.00 Paid $2,650.00 to go   ? Authorization - Visit Number 19   ? Authorization - Number of Visits 116   ? PT Start Time 0800   ? PT Stop Time 0840   ? PT Time Calculation (min) 40 min   ? Activity Tolerance Patient tolerated treatment well   ? Behavior During Therapy [facility] for tasks assessed/performed   ? ?  ?  ? ?  ? ? ?Past Medical History:  ?Diagnosis Date  ? Arthritis   ? Diabetes mellitus without complication ([place])   ? type 2 controlled with diet   ? Diabetic toe ulcer ([place]) 11/05/2018  ? History of kidney stones   ? passed 1 several years ago  ? Hyperlipidemia   ? Hypertension   ? not on medications  ? Peripheral vascular disease ([place])   ? stent in left leg due to anerysm   ? ? ?Past Surgical History:  ?Procedure Laterality Date  ?  Left Transmetatarsal Ampuation (Left Foot)  08/20/2020  ? ABDOMINAL AORTOGRAM W/LOWER EXTREMITY N/A 08/06/2020  ? Procedure: ABDOMINAL AORTOGRAM W/LOWER EXTREMITY;  Surgeon: Marty Heck, MD;  Location: [place] CV LAB;  Service: Cardiovascular;  Laterality: N/A;  ? AMPUTATION Right 11/07/2018  ? Procedure: RIGHT 3RD TOE AMPUTATION;  Surgeon: Newt Minion, MD;  Location: [place];  Service: Orthopedics;  Laterality: Right;  ? AMPUTATION Left 08/20/2020  ? Procedure: Left Transmetatarsal Ampuation;  Surgeon: Marty Heck, MD;  Location: [place];  Service: Vascular;  Laterality: Left;   ? AMPUTATION Left 10/03/2020  ? Procedure: LEFT BELOW KNEE AMPUTATION;  Surgeon: Marty Heck, MD;  Location: [place];  Service: Vascular;  Laterality: Left;  ? AMPUTATION TOE Left   ? big toe and one next to it  ? CATARACT EXTRACTION Bilateral   ? EYE SURGERY Bilateral   ? cataracts removed  ? INSERTION OF ILIAC STENT Left   ? PERIPHERAL VASCULAR BALLOON ANGIOPLASTY Left 08/06/2020  ? Procedure: PERIPHERAL VASCULAR BALLOON ANGIOPLASTY;  Surgeon: Marty Heck, MD;  Location: [place] CV LAB;  Service: Cardiovascular;  Laterality: Left;  Peroneal artery.  ? TOE AMPUTATION Left 2015  ? TOTAL HIP ARTHROPLASTY Left 05/23/2020  ? Procedure: LEFT TOTAL HIP ARTHROPLASTY ANTERIOR APPROACH;  Surgeon: Mcarthur Rossetti, MD;  Location: [facility];  Service: Orthopedics;  Laterality: Left;  ? TOTAL HIP ARTHROPLASTY Right 07/31/2021  ? Procedure: RIGHT TOTAL HIP ARTHROPLASTY ANTERIOR APPROACH;  Surgeon: Mcarthur Rossetti, MD;  Location: [facility];  Service: Orthopedics;  Laterality: Right;  ? WOUND DEBRIDEMENT Left 09/11/2020  ? Procedure: DEBRIDEMENT OF LEFT TRANSMETATARSAL WOUND;  Surgeon: Cherre Robins, MD;  Location: [place];  Service: Vascular;  Laterality: Left;  ? ? ?There were no vitals filed for this visit. ? ? Subjective Assessment - 12/29/21 0809   ? ? Subjective he relays he is sore today in his left residual  limb from increased activity at home.   ? Pertinent History Left TTTA, left THA (05/23/20), arthritis, DM2, HTN, HLD, PVD   ? Patient Stated Goals Wants to use prosthesis to return to some work, get back in community, be mobile, work in gym in his garage   ? Currently in Pain? Yes   ? Pain Score 4    ? Pain Location Leg   ? Pain Orientation Left   ? Pain Descriptors / Indicators Sore   ? Pain Onset More than a month ago   ? Pain Onset More than a month ago   ? Pain Onset Yesterday   ? ?  ?  ? ?  ? ? ? ? [place] Adult PT Treatment/Exercise - 12/29/21 0001   ? ?  ? Ambulation/Gait  ?  Ambulation/Gait Yes   ? Ambulation/Gait Assistance 5: Supervision   ? Assistive device Prosthesis;None   ?  ? Therapeutic Activites   ? Lifting lifting 15# kettle bell squats with prosthesis forward offset to enable ability to get lower 10 reps 2 sets.  PT demo & verbal cues on technique   ? Work Economist push / Musician wts (15# ea stack for 30# total) walking out & back 10 reps ea with weight posteriorly & anteriorly   ? Other Therapeutic Activities continue building standing / walking tolerance for work: PT recommended standing or walking (exercises, projects around home, etc.) for 30 min per hour of 8 hour work day. consistency would help more than large fluctutations.  Using timer on phone at 1 hour would help to remind him to check what he has done in last hour or need to stand.  Pt verbalized understanding.   ?  ? Knee/Hip Exercises: Aerobic  ? Altamese Caraway with bilat UE support and cues and demo for getting on/off and safety precautions 2.0 mph X 3 min, then 3 minute rest and 1.9 mph X 3 min. Discussed performing intervals of equal walk/rest times until he can work up to about 20 minutes then he can start to reduce rest intervals.   ?  ? Knee/Hip Exercises: Machines for Strengthening  ? Total Gym Leg Press DL 150# 25reps, Rt leg only 87# 20 reps, Lt leg only 75# 20 reps   ?  ? Knee/Hip Exercises: Standing  ? Forward Step Up Both;10 reps;Hand Hold: 2;Step Height: 8"   BOSU round side up  ? Forward Step Up Limitations 2 step lead to BOSU & 2 step away from BOSU to work on power along with strength   ? Step Down Both;10 reps;1 set;Hand Hold: 2;Step Height: 8"   BOSU round side up  ? Step Down Limitations 2 step lead to BOSU & 2 step away from BOSU to work on power along with strength   ? ?  ?  ? ?  ? ? ? ? ? ? ? ? ? ? ? ? PT Short Term Goals - 12/24/21 0835   ? ?  ? PT SHORT TERM GOAL #1  ? Title Oceanographer >/= [phone]   ? Time 4   ? Period Weeks   ? Status Achieved   ? Target Date 12/24/21   ?  ? PT SHORT  TERM GOAL #2  ? Title Pt negotiates stairs with single rail alternating pattern with minimal cues only.   ? Baseline ascends one rail but needs 2 rails to descend   ? Time 4   ? Period Weeks   ? Status Partially Met   ?  Target Date 12/24/21   ? ?  ?  ? ?  ? ? ? ? PT Long Term Goals - 11/26/21 1431   ? ?  ? PT LONG TERM GOAL #1  ? Title He will improve FOTO functional score to 59%   ? Time 8   ? Period Weeks   ? Status On-going   ? Target Date 01/22/22   ?  ? PT LONG TERM GOAL #2  ? Title Oceanographer >/= 52/56   ? Time 8   ? Period Weeks   ? Status Revised   ? Target Date 01/22/22   ?  ? PT LONG TERM GOAL #3  ? Title Timed Up & Go with prosthesis only <13.5sec and cognitive TUG <15.5sec to indicate lower fall risk.   ? Time 8   ? Period Weeks   ? Status New   ? Target Date 01/22/22   ?  ? PT LONG TERM GOAL #4  ? Title Functional Gait Assessment with prosthesis only >/= 19/30 to indicate lower fall risk.   ? Time 8   ? Period Weeks   ? Status New   ? Target Date 01/22/22   ?  ? PT LONG TERM GOAL #5  ? Title Patient ambulates 500' and negotiates ramps, curbs & stairs single rail alternating pattern with prosthesis only modified independent.   ? Time 8   ? Period Weeks   ? Status New   ? Target Date 01/22/22   ?  ? Additional Long Term Goals  ? Additional Long Term Goals Yes   ?  ? PT LONG TERM GOAL #6  ? Title Patient reports pain </= 2/10 in right hip & left residual limb with standing & gait activities.   ? Time 8   ? Period Weeks   ? Status Revised   ? Target Date 01/22/22   ? ?  ?  ? ?  ? ? ? ? ? ? ? ? Plan - 12/29/21 0856   ? ? Clinical Impression Statement he is progressing with his standing activity levels and functional strengthening. He does still lack endurance and will continue to benefit from PT. We added treadmill today to add to building his overall endurance and he was able to safely perform this and understands how to appropriatley add this at gym with walk/rest intervals.   ? Personal Factors and  Comorbidities Comorbidity 3+;Fitness;Time since onset of injury/illness/exacerbation;Past/Current Experience   ? Comorbidities RT THA, Left TTA, left THA (05/23/20), arthritis, DM2, HTN, HLD, PVD   ? Examination-Activ

## 2022-01-05 ENCOUNTER — Encounter: Payer: Self-pay | Admitting: Physical Therapy

## 2022-01-05 ENCOUNTER — Other Ambulatory Visit: Payer: Self-pay

## 2022-01-05 ENCOUNTER — Ambulatory Visit (INDEPENDENT_AMBULATORY_CARE_PROVIDER_SITE_OTHER): Payer: BC Managed Care – PPO | Admitting: Physical Therapy

## 2022-01-05 DIAGNOSIS — M25651 Stiffness of right hip, not elsewhere classified: Secondary | ICD-10-CM

## 2022-01-05 DIAGNOSIS — M6281 Muscle weakness (generalized): Secondary | ICD-10-CM | POA: Diagnosis not present

## 2022-01-05 DIAGNOSIS — M25662 Stiffness of left knee, not elsewhere classified: Secondary | ICD-10-CM

## 2022-01-05 DIAGNOSIS — M25551 Pain in right hip: Secondary | ICD-10-CM

## 2022-01-05 DIAGNOSIS — R2681 Unsteadiness on feet: Secondary | ICD-10-CM | POA: Diagnosis not present

## 2022-01-05 DIAGNOSIS — R2689 Other abnormalities of gait and mobility: Secondary | ICD-10-CM | POA: Diagnosis not present

## 2022-01-05 DIAGNOSIS — R293 Abnormal posture: Secondary | ICD-10-CM

## 2022-01-05 DIAGNOSIS — M79662 Pain in left lower leg: Secondary | ICD-10-CM

## 2022-01-05 NOTE — Therapy (Signed)
[facility] [facility] ?[address] ?[place], [place], [address] ?Phone: [phone]   Fax:  [phone] ? ?Physical Therapy Treatment ? ?Patient Details  ?Name: Anthony Park ?MRN: [id number] ?Date of Birth: [birth year] ?Referring Provider (PT): Carney Bern ? ? ?Encounter Date: 01/05/2022 ? ? PT End of Session - 01/05/22 0803   ? ? Visit Number 31   ? Number of Visits 39   ? Date for PT Re-Evaluation 01/22/22   ? Authorization Type BCBS comm ppo   ? Authorization Time Period 2023 Deductible ($1,450.00)$0.00 Paid $1,450.00 to go, Out-of-Pocket Limit ($2,650.00)  $0.00 Paid $2,650.00 to go   ? Authorization - Visit Number 20   ? Authorization - Number of Visits 116   ? PT Start Time 0800   ? PT Stop Time 0844   ? PT Time Calculation (min) 44 min   ? Activity Tolerance Patient limited by pain;Patient tolerated treatment well   ? Behavior During Therapy [facility] for tasks assessed/performed   ? ?  ?  ? ?  ? ? ?Past Medical History:  ?Diagnosis Date  ? Arthritis   ? Diabetes mellitus without complication ([place])   ? type 2 controlled with diet   ? Diabetic toe ulcer ([place]) 11/05/2018  ? History of kidney stones   ? passed 1 several years ago  ? Hyperlipidemia   ? Hypertension   ? not on medications  ? Peripheral vascular disease ([place])   ? stent in left leg due to anerysm   ? ? ?Past Surgical History:  ?Procedure Laterality Date  ?  Left Transmetatarsal Ampuation (Left Foot)  08/20/2020  ? ABDOMINAL AORTOGRAM W/LOWER EXTREMITY N/A 08/06/2020  ? Procedure: ABDOMINAL AORTOGRAM W/LOWER EXTREMITY;  Surgeon: Marty Heck, MD;  Location: [place] [facility];  Service: Cardiovascular;  Laterality: N/A;  ? AMPUTATION Right 11/07/2018  ? Procedure: RIGHT 3RD TOE AMPUTATION;  Surgeon: Newt Minion, MD;  Location: [place];  Service: Orthopedics;  Laterality: Right;  ? AMPUTATION Left 08/20/2020  ? Procedure: Left Transmetatarsal Ampuation;  Surgeon: Marty Heck, MD;  Location: [place];  Service:  Vascular;  Laterality: Left;  ? AMPUTATION Left 10/03/2020  ? Procedure: LEFT BELOW KNEE AMPUTATION;  Surgeon: Marty Heck, MD;  Location: [place];  Service: Vascular;  Laterality: Left;  ? AMPUTATION TOE Left   ? big toe and one next to it  ? CATARACT EXTRACTION Bilateral   ? EYE SURGERY Bilateral   ? cataracts removed  ? INSERTION OF ILIAC STENT Left   ? PERIPHERAL VASCULAR BALLOON ANGIOPLASTY Left 08/06/2020  ? Procedure: PERIPHERAL VASCULAR BALLOON ANGIOPLASTY;  Surgeon: Marty Heck, MD;  Location: [place] [facility];  Service: Cardiovascular;  Laterality: Left;  Peroneal artery.  ? TOE AMPUTATION Left 2015  ? TOTAL HIP ARTHROPLASTY Left 05/23/2020  ? Procedure: LEFT TOTAL HIP ARTHROPLASTY ANTERIOR APPROACH;  Surgeon: Mcarthur Rossetti, MD;  [facility]: [facility];  Service: Orthopedics;  Laterality: Left;  ? TOTAL HIP ARTHROPLASTY Right 07/31/2021  ? Procedure: RIGHT TOTAL HIP ARTHROPLASTY ANTERIOR APPROACH;  Surgeon: Mcarthur Rossetti, MD;  Location: [facility];  Service: Orthopedics;  Laterality: Right;  ? WOUND DEBRIDEMENT Left 09/11/2020  ? Procedure: DEBRIDEMENT OF LEFT TRANSMETATARSAL WOUND;  Surgeon: Cherre Robins, MD;  Location: [place];  Service: Vascular;  Laterality: Left;  ? ? ?There were no vitals filed for this visit. ? ? Subjective Assessment - 01/05/22 0800   ? ? Subjective He had SS disability MD on Saturday  but does not know outcome.  He has not tried treadmill at gym.   ? Pertinent History Left TTTA, left THA (05/23/20), arthritis, DM2, HTN, HLD, PVD   ? Patient Stated Goals Wants to use prosthesis to return to some work, get back in community, be mobile, work in gym in his garage   ? Currently in Pain? Yes   ? Pain Score 0-No pain   up to 4-5/10  ? Pain Location Leg   residual limb  ? Pain Orientation Left;Anterior   ? Pain Descriptors / Indicators Sore   ? Pain Type Chronic pain   ? Pain Onset More than a month ago   ? Pain Frequency Intermittent   ? Aggravating Factors   not taking meds, standing or walking longer times,   ? Pain Relieving Factors meds,   ? Pain Score 0   ? Pain Location Hip   ? Pain Orientation Right   ? Pain Descriptors / Indicators Tightness   ? Pain Type Chronic pain   ? Pain Onset More than a month ago   ? Pain Frequency Intermittent   ? Aggravating Factors  laying in bed longer   ? Pain Relieving Factors get up & moving   ? Pain Onset Yesterday   ? ?  ?  ? ?  ? ? ? ? ? ? ? ? ? ? ? ? ? ? ? ? ? ? ? ? [place] Adult PT Treatment/Exercise - 01/05/22 0800   ? ?  ? Ambulation/Gait  ? Ambulation/Gait Yes   ? Ambulation/Gait Assistance 5: Supervision   ? Assistive device Prosthesis;None   ?  ? High Level Balance  ? High Level Balance Activities Tandem walking;Side stepping   on foam beam dynamic motion  ?  ? Therapeutic Activites   ? Lifting lifting 15# kettle bell squats with prosthesis forward offset to enable ability to get lower 10 reps 2 sets.  PT demo & verbal cues on technique   ? Work Economist push / Musician wts (15# ea stack for 30# total) walking out & back 10 reps ea with weight posteriorly & anteriorly   ? Other Therapeutic Activities continue building standing / walking tolerance for work: PT recommended standing or walking (exercises, projects around home, etc.) for 30 min per hour of 8 hour work day. consistency would help more than large fluctutations.  Using timer on phone at 1 hour would help to remind him to check what he has done in last hour or need to stand.  Pt verbalized understanding.   ?  ? Knee/Hip Exercises: Aerobic  ? Tread Mill BUE support 1.16mh 337m 2 sets with 3 min rest between.  pt verbalized & demo treadmill safety.   ?  ? Knee/Hip Exercises: Machines for Strengthening  ? Total Gym Leg Press DL 150# 25reps, Rt leg only 87# 20 reps, Lt leg only 75# 20 reps   ?  ? Knee/Hip Exercises: Standing  ? Forward Step Up Both;Hand Hold: 2;Step Height: 8";5 reps   BOSU round side up  ? Forward Step Up Limitations 2 step lead to BOSU & 2 step  away from BOSU to work on power along with strength   ? Step Down Both;1 set;Hand Hold: 2;Step Height: 8";5 reps   BOSU round side up  ? Step Down Limitations 2 step lead to BOSU & 2 step away from BOSU to work on power along with strength   ? ?  ?  ? ?  ? ? ? ? ? ? ? ? ? ? ? ?  PT Short Term Goals - 12/24/21 0835   ? ?  ? PT SHORT TERM GOAL #1  ? Title Oceanographer >/= [phone]   ? Time 4   ? Period Weeks   ? Status Achieved   ? Target Date 12/24/21   ?  ? PT SHORT TERM GOAL #2  ? Title Pt negotiates stairs with single rail alternating pattern with minimal cues only.   ? Baseline ascends one rail but needs 2 rails to descend   ? Time 4   ? Period Weeks   ? Status Partially Met   ? Target Date 12/24/21   ? ?  ?  ? ?  ? ? ? ? PT Long Term Goals - 11/26/21 1431   ? ?  ? PT LONG TERM GOAL #1  ? Title He will improve FOTO functional score to 59%   ? Time 8   ? Period Weeks   ? Status On-going   ? Target Date 01/22/22   ?  ? PT LONG TERM GOAL #2  ? Title Oceanographer >/= 52/56   ? Time 8   ? Period Weeks   ? Status Revised   ? Target Date 01/22/22   ?  ? PT LONG TERM GOAL #3  ? Title Timed Up & Go with prosthesis only <13.5sec and cognitive TUG <15.5sec to indicate lower fall risk.   ? Time 8   ? Period Weeks   ? Status New   ? Target Date 01/22/22   ?  ? PT LONG TERM GOAL #4  ? Title Functional Gait Assessment with prosthesis only >/= 19/30 to indicate lower fall risk.   ? Time 8   ? Period Weeks   ? Status New   ? Target Date 01/22/22   ?  ? PT LONG TERM GOAL #5  ? Title Patient ambulates 500' and negotiates ramps, curbs & stairs single rail alternating pattern with prosthesis only modified independent.   ? Time 8   ? Period Weeks   ? Status New   ? Target Date 01/22/22   ?  ? Additional Long Term Goals  ? Additional Long Term Goals Yes   ?  ? PT LONG TERM GOAL #6  ? Title Patient reports pain </= 2/10 in right hip & left residual limb with standing & gait activities.   ? Time 8   ? Period Weeks   ? Status Revised   ?  Target Date 01/22/22   ? ?  ?  ? ?  ? ? ? ? ? ? ? ? Plan - 01/05/22 0807   ? ? Clinical Impression Statement Patient continues to progress functional strength & balance but struggles with endurance. His residual

## 2022-01-07 ENCOUNTER — Other Ambulatory Visit: Payer: Self-pay

## 2022-01-07 ENCOUNTER — Ambulatory Visit (INDEPENDENT_AMBULATORY_CARE_PROVIDER_SITE_OTHER): Payer: BC Managed Care – PPO | Admitting: Physical Therapy

## 2022-01-07 ENCOUNTER — Encounter: Payer: Self-pay | Admitting: Physical Therapy

## 2022-01-07 DIAGNOSIS — M25651 Stiffness of right hip, not elsewhere classified: Secondary | ICD-10-CM

## 2022-01-07 DIAGNOSIS — R2689 Other abnormalities of gait and mobility: Secondary | ICD-10-CM

## 2022-01-07 DIAGNOSIS — M6281 Muscle weakness (generalized): Secondary | ICD-10-CM

## 2022-01-07 DIAGNOSIS — M25551 Pain in right hip: Secondary | ICD-10-CM | POA: Diagnosis not present

## 2022-01-07 DIAGNOSIS — M79662 Pain in left lower leg: Secondary | ICD-10-CM

## 2022-01-07 DIAGNOSIS — R2681 Unsteadiness on feet: Secondary | ICD-10-CM

## 2022-01-07 NOTE — Therapy (Signed)
[place] [facility] ?[address] ?[place], [place], [address] ?Phone: [phone]   Fax:  [phone] ? ?Physical Therapy Treatment ? ?Patient Details  ?Name: Anthony Park ?MRN: [id number] ?Date of Birth: [birth year] ?Referring Provider (PT): Carney Bern ? ? ?Encounter Date: 01/07/2022 ? ? PT End of Session - 01/07/22 0805   ? ? Visit Number 32   ? Number of Visits 39   ? Date for PT Re-Evaluation 01/22/22   ? Authorization Type BCBS comm ppo   ? Authorization Time Period 2023 Deductible ($1,450.00)$0.00 Paid $1,450.00 to go, Out-of-Pocket Limit ($2,650.00)  $0.00 Paid $2,650.00 to go   ? Authorization - Visit Number 25   ? Authorization - Number of Visits 116   ? PT Start Time 0800   ? PT Stop Time 0844   ? PT Time Calculation (min) 44 min   ? Activity Tolerance Patient limited by pain;Patient tolerated treatment well   ? Behavior During Therapy [facility] for tasks assessed/performed   ? ?  ?  ? ?  ? ? ?Past Medical History:  ?Diagnosis Date  ? Arthritis   ? Diabetes mellitus without complication ([place])   ? type 2 controlled with diet   ? Diabetic toe ulcer ([place]) 11/05/2018  ? History of kidney stones   ? passed 1 several years ago  ? Hyperlipidemia   ? Hypertension   ? not on medications  ? Peripheral vascular disease ([place])   ? stent in left leg due to anerysm   ? ? ?Past Surgical History:  ?Procedure Laterality Date  ?  Left Transmetatarsal Ampuation (Left Foot)  08/20/2020  ? ABDOMINAL AORTOGRAM W/LOWER EXTREMITY N/A 08/06/2020  ? Procedure: ABDOMINAL AORTOGRAM W/LOWER EXTREMITY;  Surgeon: Marty Heck, MD;  Location: [place] [facility];  Service: Cardiovascular;  Laterality: N/A;  ? AMPUTATION Right 11/07/2018  ? Procedure: RIGHT 3RD TOE AMPUTATION;  Surgeon: Newt Minion, MD;  Location: [place];  Service: Orthopedics;  Laterality: Right;  ? AMPUTATION Left 08/20/2020  ? Procedure: Left Transmetatarsal Ampuation;  Surgeon: Marty Heck, MD;  Location: [place];  Service:  Vascular;  Laterality: Left;  ? AMPUTATION Left 10/03/2020  ? Procedure: LEFT BELOW KNEE AMPUTATION;  Surgeon: Marty Heck, MD;  Location: [place];  Service: Vascular;  Laterality: Left;  ? AMPUTATION TOE Left   ? big toe and one next to it  ? CATARACT EXTRACTION Bilateral   ? EYE SURGERY Bilateral   ? cataracts removed  ? INSERTION OF ILIAC STENT Left   ? PERIPHERAL VASCULAR BALLOON ANGIOPLASTY Left 08/06/2020  ? Procedure: PERIPHERAL VASCULAR BALLOON ANGIOPLASTY;  Surgeon: Marty Heck, MD;  Location: [place] [facility];  Service: Cardiovascular;  Laterality: Left;  Peroneal artery.  ? TOE AMPUTATION Left 2015  ? TOTAL HIP ARTHROPLASTY Left 05/23/2020  ? Procedure: LEFT TOTAL HIP ARTHROPLASTY ANTERIOR APPROACH;  Surgeon: Mcarthur Rossetti, MD;  [facility];  Service: Orthopedics;  Laterality: Left;  ? TOTAL HIP ARTHROPLASTY Right 07/31/2021  ? Procedure: RIGHT TOTAL HIP ARTHROPLASTY ANTERIOR APPROACH;  Surgeon: Mcarthur Rossetti, MD;  [facility];  Service: Orthopedics;  Laterality: Right;  ? WOUND DEBRIDEMENT Left 09/11/2020  ? Procedure: DEBRIDEMENT OF LEFT TRANSMETATARSAL WOUND;  Surgeon: Cherre Robins, MD;  Location: [place];  Service: Vascular;  Laterality: Left;  ? ? ?There were no vitals filed for this visit. ? ? Subjective Assessment - 01/07/22 0800   ? ? Subjective He wants to go to back to  work.  He could do less manual requirements.   ? Pertinent History Left TTTA, left THA (05/23/20), arthritis, DM2, HTN, HLD, PVD   ? Patient Stated Goals Wants to use prosthesis to return to some work, get back in community, be mobile, work in gym in his garage   ? Currently in Pain? No/denies   ? Pain Score 0-No pain   ? Pain Location Leg   residual limb  ? Pain Orientation Left   ? Pain Onset More than a month ago   ? Multiple Pain Sites No   ? Pain Score 0   ? Pain Location Hip   ? Pain Orientation Right   ? Pain Onset More than a month ago   ? Pain Onset Yesterday   ? ?  ?  ? ?   ? ? ? ? ? ? ? ? ? ? ? ? ? ? ? ? ? ? ? ? [place] Adult PT Treatment/Exercise - 01/07/22 0800   ? ?  ? Ambulation/Gait  ? Ambulation/Gait Yes   ? Ambulation/Gait Assistance 5: Supervision   ? Assistive device Prosthesis;None   ?  ? High Level Balance  ? High Level Balance Activities --   ?  ? Therapeutic Activites   ? Lifting lifting 20# kettle bell squats with prosthesis forward offset to enable ability to get lower 10 reps 2 sets. PT reminded pt to check prosthetic foot position to enable lt knee flexion with squat.   ? Work Economist push / Musician wts (15# ea stack for 30# total) walking out & back 10 reps ea with weight posteriorly & anteriorly   ? Other Therapeutic Activities continue building standing / walking tolerance for work: PT recommended standing or walking (exercises, projects around home, etc.) for 30 min per hour of 8 hour work day. consistency would help more than large fluctutations.  Using timer on phone at 1 hour would help to remind him to check what he has done in last hour or need to stand.  Pt verbalized understanding.   ?  ? Knee/Hip Exercises: Aerobic  ? Tread Mill BUE support 1.3mh 364m 2 sets with 3 min rest between.  pt verbalized & demo treadmill safety. PT verbally educated on building up total work time to 20-3065musing timed work:rest periods. Recommendation to increase work to 5 min sets for 20-47m76motal work prior to decreasing rest periods. Also only change including speed one thing each week. Need to time rests to have better control. pt verbalized understanding.   ?  ? Knee/Hip Exercises: Machines for Strengthening  ? Total Gym Leg Press DL 150# 25reps, Rt leg only 87# 20 reps, Lt leg only 75# 20 reps   ?  ? Knee/Hip Exercises: Standing  ? Forward Step Up Both;Hand Hold: 2;Step Height: 8";5 reps   BOSU round side up  ? Forward Step Up Limitations 2 step lead to BOSU & 2 step away from BOSU to work on power along with strength   ? Step Down Both;1 set;Hand Hold: 2;Step  Height: 8";5 reps   BOSU round side up  ? Step Down Limitations 2 step lead to BOSU & 2 step away from BOSU to work on power along with strength   ? ?  ?  ? ?  ? ? ? ? ? ? ? ? ? ? ? ? PT Short Term Goals - 12/24/21 0835   ? ?  ? PT SHORT TERM GOAL #1  ? Title BergOceanographer  49/56   ? Time 4   ? Period Weeks   ? Status Achieved   ? Target Date 12/24/21   ?  ? PT SHORT TERM GOAL #2  ? Title Pt negotiates stairs with single rail alternating pattern with minimal cues only.   ? Baseline ascends one rail but needs 2 rails to descend   ? Time 4   ? Period Weeks   ? Status Partially Met   ? Target Date 12/24/21   ? ?  ?  ? ?  ? ? ? ? PT Long Term Goals - 11/26/21 1431   ? ?  ? PT LONG TERM GOAL #1  ? Title He will improve FOTO functional score to 59%   ? Time 8   ? Period Weeks   ? Status On-going   ? Target Date 01/22/22   ?  ? PT LONG TERM GOAL #2  ? Title Oceanographer >/= 52/56   ? Time 8   ? Period Weeks   ? Status Revised   ? Target Date 01/22/22   ?  ? PT LONG TERM GOAL #3  ? Title Timed Up & Go with prosthesis only <13.5sec and cognitive TUG <15.5sec to indicate lower fall risk.   ? Time 8   ? Period Weeks   ? Status New   ? Target Date 01/22/22   ?  ? PT LONG TERM GOAL #4  ? Title Functional Gait Assessment with prosthesis only >/= 19/30 to indicate lower fall risk.   ? Time 8   ? Period Weeks   ? Status New   ? Target Date 01/22/22   ?  ? PT LONG TERM GOAL #5  ? Title Patient ambulates 500' and negotiates ramps, curbs & stairs single rail alternating pattern with prosthesis only modified independent.   ? Time 8   ? Period Weeks   ? Status New   ? Target Date 01/22/22   ?  ? Additional Long Term Goals  ? Additional Long Term Goals Yes   ?  ? PT LONG TERM GOAL #6  ? Title Patient reports pain </= 2/10 in right hip & left residual limb with standing & gait activities.   ? Time 8   ? Period Weeks   ? Status Revised   ? Target Date 01/22/22   ? ?  ?  ? ?  ? ? ? ? ? ? ? ? Plan - 01/07/22 0811   ? ? Clinical Impression  Statement Patient had issues with residual limb muscle cramps with standing & gait activities today which required more frequent & longer rests than previously.  He appears to understand PT recommendations for

## 2022-01-12 ENCOUNTER — Ambulatory Visit (INDEPENDENT_AMBULATORY_CARE_PROVIDER_SITE_OTHER): Payer: BC Managed Care – PPO | Admitting: Physical Therapy

## 2022-01-12 ENCOUNTER — Other Ambulatory Visit: Payer: Self-pay

## 2022-01-12 ENCOUNTER — Encounter: Payer: Self-pay | Admitting: Physical Therapy

## 2022-01-12 DIAGNOSIS — R2689 Other abnormalities of gait and mobility: Secondary | ICD-10-CM

## 2022-01-12 DIAGNOSIS — M6281 Muscle weakness (generalized): Secondary | ICD-10-CM

## 2022-01-12 DIAGNOSIS — M25651 Stiffness of right hip, not elsewhere classified: Secondary | ICD-10-CM

## 2022-01-12 DIAGNOSIS — R2681 Unsteadiness on feet: Secondary | ICD-10-CM | POA: Diagnosis not present

## 2022-01-12 DIAGNOSIS — M79662 Pain in left lower leg: Secondary | ICD-10-CM

## 2022-01-12 DIAGNOSIS — M25551 Pain in right hip: Secondary | ICD-10-CM

## 2022-01-12 NOTE — Therapy (Signed)
[facility] ?[address] ?[place], [place], [address] ?Phone: [phone]   Fax:  [phone] ? ?Physical Therapy Treatment ? ?Patient Details  ?Name: Anthony Park ?MRN: [id number] ?Date of Birth: Jan 12, 1961 ?Referring Provider (PT): Carney Bern ? ? ?Encounter Date: 01/12/2022 ? ? PT End of Session - 01/12/22 0803   ? ? Visit Number 33   ? Number of Visits 39   ? Date for PT Re-Evaluation 01/22/22   ? Authorization Type BCBS comm ppo   ? Authorization Time Period 2023 Deductible ($1,450.00)$0.00 Paid $1,450.00 to go, Out-of-Pocket Limit ($2,650.00)  $0.00 Paid $2,650.00 to go   ? Authorization - Visit Number 26   ? Authorization - Number of Visits 116   ? PT Start Time 0801   ? PT Stop Time [phone]   ? PT Time Calculation (min) 41 min   ? Activity Tolerance Patient limited by pain;Patient tolerated treatment well   ? Behavior During Therapy [facility] for tasks assessed/performed   ? ?  ?  ? ?  ? ? ?Past Medical History:  ?Diagnosis Date  ? Arthritis   ? Diabetes mellitus without complication ([place])   ? type 2 controlled with diet   ? Diabetic toe ulcer ([place]) 11/05/2018  ? History of kidney stones   ? passed 1 several years ago  ? Hyperlipidemia   ? Hypertension   ? not on medications  ? Peripheral vascular disease ([place])   ? stent in left leg due to anerysm   ? ? ?Past Surgical History:  ?Procedure Laterality Date  ?  Left Transmetatarsal Ampuation (Left Foot)  08/20/2020  ? ABDOMINAL AORTOGRAM W/LOWER EXTREMITY N/A 08/06/2020  ? Procedure: ABDOMINAL AORTOGRAM W/LOWER EXTREMITY;  Surgeon: Marty Heck, MD;  Location: [place] [facility];  Service: Cardiovascular;  Laterality: N/A;  ? AMPUTATION Right 11/07/2018  ? Procedure: RIGHT 3RD TOE AMPUTATION;  Surgeon: Newt Minion, MD;  Location: [place];  Service: Orthopedics;  Laterality: Right;  ? AMPUTATION Left 08/20/2020  ? Procedure: Left Transmetatarsal Ampuation;  Surgeon: Marty Heck, MD;  Location: [place];  Service:  Vascular;  Laterality: Left;  ? AMPUTATION Left 10/03/2020  ? Procedure: LEFT BELOW KNEE AMPUTATION;  Surgeon: Marty Heck, MD;  Location: [place];  Service: Vascular;  Laterality: Left;  ? AMPUTATION TOE Left   ? big toe and one next to it  ? CATARACT EXTRACTION Bilateral   ? EYE SURGERY Bilateral   ? cataracts removed  ? INSERTION OF ILIAC STENT Left   ? PERIPHERAL VASCULAR BALLOON ANGIOPLASTY Left 08/06/2020  ? Procedure: PERIPHERAL VASCULAR BALLOON ANGIOPLASTY;  Surgeon: Marty Heck, MD;  Location: [place] [facility];  Service: Cardiovascular;  Laterality: Left;  Peroneal artery.  ? TOE AMPUTATION Left 2015  ? TOTAL HIP ARTHROPLASTY Left 05/23/2020  ? Procedure: LEFT TOTAL HIP ARTHROPLASTY ANTERIOR APPROACH;  Surgeon: Mcarthur Rossetti, MD;  [facility];  Service: Orthopedics;  Laterality: Left;  ? TOTAL HIP ARTHROPLASTY Right 07/31/2021  ? Procedure: RIGHT TOTAL HIP ARTHROPLASTY ANTERIOR APPROACH;  Surgeon: Mcarthur Rossetti, MD;  Location: [facility];  Service: Orthopedics;  Laterality: Right;  ? WOUND DEBRIDEMENT Left 09/11/2020  ? Procedure: DEBRIDEMENT OF LEFT TRANSMETATARSAL WOUND;  Surgeon: Cherre Robins, MD;  Location: [place];  Service: Vascular;  Laterality: Left;  ? ? ?There were no vitals filed for this visit. ? ? Subjective Assessment - 01/12/22 0801   ? ? Subjective He worked out every day over weekend  and tried to control work:rest as PT recommended.   ? Pertinent History Left TTTA, left THA (05/23/20), arthritis, DM2, HTN, HLD, PVD   ? Patient Stated Goals Wants to use prosthesis to return to some work, get back in community, be mobile, work in gym in his garage   ? Currently in Pain? Yes   ? Pain Score 1    ? Pain Location Leg   residual limb  ? Pain Orientation Left   ? Pain Descriptors / Indicators Sore   ? Pain Type Chronic pain   ? Pain Onset More than a month ago   ? Pain Frequency Intermittent   ? Aggravating Factors  extension of limb in socket   ? Pain  Relieving Factors meds   ? Multiple Pain Sites Yes   ? Pain Score 4   ? Pain Location Hip   ? Pain Orientation Right   ? Pain Descriptors / Indicators Sore   ? Pain Type Chronic pain   ? Pain Onset More than a month ago   ? Pain Frequency Intermittent   ? Aggravating Factors  exercises   ? Pain Relieving Factors rest   ? Pain Onset Yesterday   ? ?  ?  ? ?  ? ? ? ? ? ? ? ? ? ? ? ? ? ? ? ? ? ? ? ? [place] Adult PT Treatment/Exercise - 01/12/22 0801   ? ?  ? Ambulation/Gait  ? Ambulation/Gait Yes   ? Ambulation/Gait Assistance 5: Supervision   ? Assistive device Prosthesis;None   ? Stairs Yes   ? Stairs Assistance 5: Supervision   verbal cues only  ? Stairs Assistance Details (indicate cue type and reason) cues on prosthetic foot placement descending to enable knee flexion   ? Stair Management Technique Two rails;Alternating pattern;Forwards   ? Number of Stairs 11   7  ?  ? High Level Balance  ? High Level Balance Activities Braiding;Marching forwards;Marching backwards;Tandem walking;Side stepping   tandem & sidestep on foam beam;  braiding & marching on floor  ? High Level Balance Comments intermittent touch on //bars for balance   ?  ? Therapeutic Activites   ? Lifting lifting 20# kettle bell squats with prosthesis forward offset to enable ability to get lower 10 reps 2 sets. PT reminded pt to check prosthetic foot position to enable lt knee flexion with squat.   ? Work Economist push / Musician wts (15# ea stack for 30# total) walking out & back 10 reps ea with weight posteriorly & anteriorly   ? Other Therapeutic Activities continue building standing / walking tolerance for work: PT recommended standing or walking (exercises, projects around home, etc.) for 30 min per hour of 8 hour work day. consistency would help more than large fluctutations.  Using timer on phone at 1 hour would help to remind him to check what he has done in last hour or need to stand.  Pt verbalized understanding.   ?  ? Neuro Re-ed   ?  Neuro Re-ed Details  stance LE with contralateral LE on slider moving ant-lat, lat, post-lat 5 reps ea LE.  single UE support. PT demo & verbal cues on technique   ?  ? Knee/Hip Exercises: Aerobic  ? Tread Mill --   ?  ? Knee/Hip Exercises: Machines for Strengthening  ? Total Gym Leg Press DL 150# 25reps, Rt leg only 87# 20 reps, Lt leg only 75# 20 reps   ?  ?  Knee/Hip Exercises: Standing  ? Forward Step Up Both;Hand Hold: 2;Step Height: 8";5 reps   BOSU round side up  ? Forward Step Up Limitations 2 step lead to BOSU & 2 step away from BOSU to work on power along with strength   ? Step Down Both;1 set;Hand Hold: 2;Step Height: 8";5 reps   BOSU round side up  ? Step Down Limitations 2 step lead to BOSU & 2 step away from BOSU to work on power along with strength   ?  ? Prosthetics  ? Prosthetic Care Comments  PT demo & verbal cues on using tapping socket for vibration sensation to override phantom pain when wearing prosthesis.  Also storing sleeve without fold when not wearing it.  pt verbalized understanding.   ? ?  ?  ? ?  ? ? ? ? ? ? ? ? ? ? ? ? PT Short Term Goals - 12/24/21 0835   ? ?  ? PT SHORT TERM GOAL #1  ? Title Oceanographer >/= [phone]   ? Time 4   ? Period Weeks   ? Status Achieved   ? Target Date 12/24/21   ?  ? PT SHORT TERM GOAL #2  ? Title Pt negotiates stairs with single rail alternating pattern with minimal cues only.   ? Baseline ascends one rail but needs 2 rails to descend   ? Time 4   ? Period Weeks   ? Status Partially Met   ? Target Date 12/24/21   ? ?  ?  ? ?  ? ? ? ? PT Long Term Goals - 11/26/21 1431   ? ?  ? PT LONG TERM GOAL #1  ? Title He will improve FOTO functional score to 59%   ? Time 8   ? Period Weeks   ? Status On-going   ? Target Date 01/22/22   ?  ? PT LONG TERM GOAL #2  ? Title Oceanographer >/= 52/56   ? Time 8   ? Period Weeks   ? Status Revised   ? Target Date 01/22/22   ?  ? PT LONG TERM GOAL #3  ? Title Timed Up & Go with prosthesis only <13.5sec and cognitive TUG <15.5sec  to indicate lower fall risk.   ? Time 8   ? Period Weeks   ? Status New   ? Target Date 01/22/22   ?  ? PT LONG TERM GOAL #4  ? Title Functional Gait Assessment with prosthesis only >/= 19/30 to indicate lower fall

## 2022-01-14 ENCOUNTER — Other Ambulatory Visit: Payer: Self-pay

## 2022-01-14 ENCOUNTER — Encounter: Payer: Self-pay | Admitting: Physical Therapy

## 2022-01-14 ENCOUNTER — Ambulatory Visit (INDEPENDENT_AMBULATORY_CARE_PROVIDER_SITE_OTHER): Payer: BC Managed Care – PPO | Admitting: Physical Therapy

## 2022-01-14 DIAGNOSIS — M6281 Muscle weakness (generalized): Secondary | ICD-10-CM

## 2022-01-14 DIAGNOSIS — R2689 Other abnormalities of gait and mobility: Secondary | ICD-10-CM | POA: Diagnosis not present

## 2022-01-14 DIAGNOSIS — M79662 Pain in left lower leg: Secondary | ICD-10-CM

## 2022-01-14 DIAGNOSIS — M25551 Pain in right hip: Secondary | ICD-10-CM

## 2022-01-14 DIAGNOSIS — R2681 Unsteadiness on feet: Secondary | ICD-10-CM

## 2022-01-14 NOTE — Therapy (Signed)
[place] ?[address] ?[place], [place], [address] ?Phone: [phone]   Fax:  [phone] ? ?Physical Therapy Treatment ? ?Patient Details  ?Name: Anthony Park ?MRN: [id number] ?Date of Birth: [birth year] ?Referring Provider (PT): Carney Bern ? ? ?Encounter Date: 01/14/2022 ? ? PT End of Session - 01/14/22 0803   ? ? Visit Number 34   ? Number of Visits 39   ? Date for PT Re-Evaluation 01/22/22   ? Authorization Type BCBS comm ppo   ? Authorization Time Period 2023 Deductible ($1,450.00)$0.00 Paid $1,450.00 to go, Out-of-Pocket Limit ($2,650.00)  $0.00 Paid $2,650.00 to go   ? Authorization - Visit Number 27   ? Authorization - Number of Visits 116   ? PT Start Time 0801   ? PT Stop Time [phone]   ? PT Time Calculation (min) 41 min   ? Activity Tolerance Patient limited by pain;Patient tolerated treatment well   ? Behavior During Therapy [facility] for tasks assessed/performed   ? ?  ?  ? ?  ? ? ?Past Medical History:  ?Diagnosis Date  ? Arthritis   ? Diabetes mellitus without complication ([place])   ? type 2 controlled with diet   ? Diabetic toe ulcer ([place]) 11/05/2018  ? History of kidney stones   ? passed 1 several years ago  ? Hyperlipidemia   ? Hypertension   ? not on medications  ? Peripheral vascular disease ([place])   ? stent in left leg due to anerysm   ? ? ?Past Surgical History:  ?Procedure Laterality Date  ?  Left Transmetatarsal Ampuation (Left Foot)  08/20/2020  ? ABDOMINAL AORTOGRAM W/LOWER EXTREMITY N/A 08/06/2020  ? Procedure: ABDOMINAL AORTOGRAM W/LOWER EXTREMITY;  Surgeon: Marty Heck, MD;  Location: [place] [facility];  Service: Cardiovascular;  Laterality: N/A;  ? AMPUTATION Right 11/07/2018  ? Procedure: RIGHT 3RD TOE AMPUTATION;  Surgeon: Newt Minion, MD;  Location: [place];  Service: Orthopedics;  Laterality: Right;  ? AMPUTATION Left 08/20/2020  ? Procedure: Left Transmetatarsal Ampuation;  Surgeon: Marty Heck, MD;  Location: [place];  Service:  Vascular;  Laterality: Left;  ? AMPUTATION Left 10/03/2020  ? Procedure: LEFT BELOW KNEE AMPUTATION;  Surgeon: Marty Heck, MD;  Location: [place];  Service: Vascular;  Laterality: Left;  ? AMPUTATION TOE Left   ? big toe and one next to it  ? CATARACT EXTRACTION Bilateral   ? EYE SURGERY Bilateral   ? cataracts removed  ? INSERTION OF ILIAC STENT Left   ? PERIPHERAL VASCULAR BALLOON ANGIOPLASTY Left 08/06/2020  ? Procedure: PERIPHERAL VASCULAR BALLOON ANGIOPLASTY;  Surgeon: Marty Heck, MD;  Location: [place] [facility];  Service: Cardiovascular;  Laterality: Left;  Peroneal artery.  ? TOE AMPUTATION Left 2015  ? TOTAL HIP ARTHROPLASTY Left 05/23/2020  ? Procedure: LEFT TOTAL HIP ARTHROPLASTY ANTERIOR APPROACH;  Surgeon: Mcarthur Rossetti, MD;  [facility];  Service: Orthopedics;  Laterality: Left;  ? TOTAL HIP ARTHROPLASTY Right 07/31/2021  ? Procedure: RIGHT TOTAL HIP ARTHROPLASTY ANTERIOR APPROACH;  Surgeon: Mcarthur Rossetti, MD;  [facility];  Service: Orthopedics;  Laterality: Right;  ? WOUND DEBRIDEMENT Left 09/11/2020  ? Procedure: DEBRIDEMENT OF LEFT TRANSMETATARSAL WOUND;  Surgeon: Cherre Robins, MD;  Location: [facility];  Service: Vascular;  Laterality: Left;  ? ? ?There were no vitals filed for this visit. ? ? Subjective Assessment - 01/14/22 0801   ? ? Subjective He is able to do more little  by little.   ? Pertinent History Left TTTA, left THA (05/23/20), arthritis, DM2, HTN, HLD, PVD   ? Patient Stated Goals Wants to use prosthesis to return to some work, get back in community, be mobile, work in gym in his garage   ? Currently in Pain? No/denies   ? Pain Score 0-No pain   ? Pain Location Leg   residual limb  ? Pain Orientation Left   ? Pain Onset More than a month ago   ? Multiple Pain Sites No   ? Pain Score 0   ? Pain Location Hip   ? Pain Orientation Right   ? Pain Onset More than a month ago   ? Pain Onset Yesterday   ? ?  ?  ? ?   ? ? ? ? ? ? ? ? ? ? ? ? ? ? ? ? ? ? ? ? [place] Adult PT Treatment/Exercise - 01/14/22 0801   ? ?  ? Ambulation/Gait  ? Ambulation/Gait Yes   ? Ambulation/Gait Assistance 5: Supervision   ? Assistive device Prosthesis;None   ? Stairs Yes   ? Stairs Assistance 5: Supervision   verbal cues only  ? Stair Management Technique Two rails;Alternating pattern;Forwards   ? Number of Stairs 11   7  ?  ? High Level Balance  ? High Level Balance Activities --   ? High Level Balance Comments --   ?  ? Therapeutic Activites   ? Lifting lifting 20# kettle bell squats with prosthesis forward offset to enable ability to get lower 10 reps 2 sets. PT reminded pt to check prosthetic foot position to enable lt knee flexion with squat.   ? Work Economist push / Musician wts (20# ea stack for 40# total) walking out & back 10 reps ea with weight posteriorly & anteriorly   ? Other Therapeutic Activities --   ?  ? Neuro Re-ed   ? Neuro Re-ed Details  --   ?  ? Knee/Hip Exercises: Stretches  ? Gastroc Stretch Left;1 rep;20 seconds   ? Gastroc Stretch Limitations stretching remainder of gastroc with prosthetic toes on higher surface with weight shift forward.  Discuss this breaking up "cramping"   ?  ? Knee/Hip Exercises: Aerobic  ? Tread Mill BUE support 2.2 mph 71min 1 sets & 2nd set 2.41mph 50min with 3 min rest between.  pt verbalized & demo treadmill safety.   ?  ? Knee/Hip Exercises: Machines for Strengthening  ? Total Gym Leg Press DL 150# 25reps, Rt leg only 87# 20 reps, Lt leg only 87# 20 reps   ?  ? Knee/Hip Exercises: Standing  ? Forward Step Up Both;Hand Hold: 2;Step Height: 8";5 reps   BOSU round side up  ? Forward Step Up Limitations 2 step lead to BOSU & 2 step away from BOSU to work on power along with strength   ? Step Down Both;1 set;Hand Hold: 2;Step Height: 8";5 reps   BOSU round side up  ? Step Down Limitations 2 step lead to BOSU & 2 step away from BOSU to work on power along with strength   ?  ? Prosthetics  ? Prosthetic  Care Comments  --   ? ?  ?  ? ?  ? ? ? ? ? ? ? ? ? ? ? ? PT Short Term Goals - 12/24/21 0835   ? ?  ? PT SHORT TERM GOAL #1  ? Title Oceanographer >/= [phone]   ?  Time 4   ? Period Weeks   ? Status Achieved   ? Target Date 12/24/21   ?  ? PT SHORT TERM GOAL #2  ? Title Pt negotiates stairs with single rail alternating pattern with minimal cues only.   ? Baseline ascends one rail but needs 2 rails to descend   ? Time 4   ? Period Weeks   ? Status Partially Met   ? Target Date 12/24/21   ? ?  ?  ? ?  ? ? ? ? PT Long Term Goals - 11/26/21 1431   ? ?  ? PT LONG TERM GOAL #1  ? Title He will improve FOTO functional score to 59%   ? Time 8   ? Period Weeks   ? Status On-going   ? Target Date 01/22/22   ?  ? PT LONG TERM GOAL #2  ? Title Oceanographer >/= 52/56   ? Time 8   ? Period Weeks   ? Status Revised   ? Target Date 01/22/22   ?  ? PT LONG TERM GOAL #3  ? Title Timed Up & Go with prosthesis only <13.5sec and cognitive TUG <15.5sec to indicate lower fall risk.   ? Time 8   ? Period Weeks   ? Status New   ? Target Date 01/22/22   ?  ? PT LONG TERM GOAL #4  ? Title Functional Gait Assessment with prosthesis only >/= 19/30 to indicate lower fall risk.   ? Time 8   ? Period Weeks   ? Status New   ? Target Date 01/22/22   ?  ? PT LONG TERM GOAL #5  ? Title Patient ambulates 500' and negotiates ramps, curbs & stairs single rail alternating pattern with prosthesis only modified independent.   ? Time 8   ? Period Weeks   ? Status New   ? Target Date 01/22/22   ?  ? Additional Long Term Goals  ? Additional Long Term Goals Yes   ?  ? PT LONG TERM GOAL #6  ? Title Patient reports pain </= 2/10 in right hip & left residual limb with standing & gait activities.   ? Time 8   ? Period Weeks   ? Status Revised   ? Target Date 01/22/22   ? ?  ?  ? ?  ? ? ? ? ? ? ? ? Plan - 01/14/22 0806   ? ? Clinical Impression Statement Patient tolerated increased weight on push & pull activity and faster speeds on treadmill. He continues to have  cramps in residual limb requiring rests between activities.   ? Personal Factors and Comorbidities Comorbidity 3+;Fitness;Time since onset of injury/illness/exacerbation;Past/Current Experience   ? Comorbidities RT THA, Left TTA, left THA (9/1

## 2022-01-19 ENCOUNTER — Encounter: Payer: Self-pay | Admitting: Physical Therapy

## 2022-01-19 ENCOUNTER — Other Ambulatory Visit: Payer: Self-pay

## 2022-01-19 ENCOUNTER — Ambulatory Visit (INDEPENDENT_AMBULATORY_CARE_PROVIDER_SITE_OTHER): Payer: BC Managed Care – PPO | Admitting: Physical Therapy

## 2022-01-19 DIAGNOSIS — R2681 Unsteadiness on feet: Secondary | ICD-10-CM | POA: Diagnosis not present

## 2022-01-19 DIAGNOSIS — M79662 Pain in left lower leg: Secondary | ICD-10-CM | POA: Diagnosis not present

## 2022-01-19 DIAGNOSIS — M25551 Pain in right hip: Secondary | ICD-10-CM

## 2022-01-19 DIAGNOSIS — R2689 Other abnormalities of gait and mobility: Secondary | ICD-10-CM | POA: Diagnosis not present

## 2022-01-19 DIAGNOSIS — M6281 Muscle weakness (generalized): Secondary | ICD-10-CM

## 2022-01-19 DIAGNOSIS — R293 Abnormal posture: Secondary | ICD-10-CM

## 2022-01-19 NOTE — Therapy (Signed)
[facility] ?[address] ?[place], [place], [address] ?Phone: [phone]   Fax:  [phone] ? ?Physical Therapy Treatment ? ?Patient Details  ?Name: Anthony Park ?MRN: [id number] ?Date of Birth: 08/19/1961 ?Referring Provider (PT): Carney Bern ? ? ?Encounter Date: 01/19/2022 ? ? PT End of Session - 01/19/22 0807   ? ? Visit Number 35   ? Number of Visits 39   ? Date for PT Re-Evaluation 01/22/22   ? Authorization Type BCBS comm ppo   ? Authorization Time Period 2023 Deductible ($1,450.00)$0.00 Paid $1,450.00 to go, Out-of-Pocket Limit ($2,650.00)  $0.00 Paid $2,650.00 to go   ? Authorization - Visit Number 28   ? Authorization - Number of Visits 116   ? PT Start Time [id number]   ? PT Stop Time [id number]   ? PT Time Calculation (min) 43 min   ? Activity Tolerance Patient limited by pain;Patient tolerated treatment well   ? Behavior During Therapy [facility] for tasks assessed/performed   ? ?  ?  ? ?  ? ? ?Past Medical History:  ?Diagnosis Date  ? Arthritis   ? Diabetes mellitus without complication ([place])   ? type 2 controlled with diet   ? Diabetic toe ulcer ([place]) 11/05/2018  ? History of kidney stones   ? passed 1 several years ago  ? Hyperlipidemia   ? Hypertension   ? not on medications  ? Peripheral vascular disease ([place])   ? stent in left leg due to anerysm   ? ? ?Past Surgical History:  ?Procedure Laterality Date  ?  Left Transmetatarsal Ampuation (Left Foot)  08/20/2020  ? ABDOMINAL AORTOGRAM W/LOWER EXTREMITY N/A 08/06/2020  ? Procedure: ABDOMINAL AORTOGRAM W/LOWER EXTREMITY;  Surgeon: Marty Heck, MD;  Location: [place] [facility];  Service: Cardiovascular;  Laterality: N/A;  ? AMPUTATION Right 11/07/2018  ? Procedure: RIGHT 3RD TOE AMPUTATION;  Surgeon: Newt Minion, MD;  Location: [place];  Service: Orthopedics;  Laterality: Right;  ? AMPUTATION Left 08/20/2020  ? Procedure: Left Transmetatarsal Ampuation;  Surgeon: Marty Heck, MD;  Location: [place];  Service:  Vascular;  Laterality: Left;  ? AMPUTATION Left 10/03/2020  ? Procedure: LEFT BELOW KNEE AMPUTATION;  Surgeon: Marty Heck, MD;  Location: [place];  Service: Vascular;  Laterality: Left;  ? AMPUTATION TOE Left   ? big toe and one next to it  ? CATARACT EXTRACTION Bilateral   ? EYE SURGERY Bilateral   ? cataracts removed  ? INSERTION OF ILIAC STENT Left   ? PERIPHERAL VASCULAR BALLOON ANGIOPLASTY Left 08/06/2020  ? Procedure: PERIPHERAL VASCULAR BALLOON ANGIOPLASTY;  Surgeon: Marty Heck, MD;  Location: [place] [facility];  Service: Cardiovascular;  Laterality: Left;  Peroneal artery.  ? TOE AMPUTATION Left 2015  ? TOTAL HIP ARTHROPLASTY Left 05/23/2020  ? Procedure: LEFT TOTAL HIP ARTHROPLASTY ANTERIOR APPROACH;  Surgeon: Mcarthur Rossetti, MD;  [facility];  Service: Orthopedics;  Laterality: Left;  ? TOTAL HIP ARTHROPLASTY Right 07/31/2021  ? Procedure: RIGHT TOTAL HIP ARTHROPLASTY ANTERIOR APPROACH;  Surgeon: Mcarthur Rossetti, MD;  Location: [facility];  Service: Orthopedics;  Laterality: Right;  ? WOUND DEBRIDEMENT Left 09/11/2020  ? Procedure: DEBRIDEMENT OF LEFT TRANSMETATARSAL WOUND;  Surgeon: Cherre Robins, MD;  Location: [place];  Service: Vascular;  Laterality: Left;  ? ? ?There were no vitals filed for this visit. ? ? Subjective Assessment - 01/19/22 0803   ? ? Subjective He is getting water leg tomorrow.  He used old prosthesis to go Kayaking over weekend with no issues including getting in/out of boat.   ? Pertinent History Left TTTA, left THA (05/23/20), arthritis, DM2, HTN, HLD, PVD   ? Patient Stated Goals Wants to use prosthesis to return to some work, get back in community, be mobile, work in gym in his garage   ? Currently in Pain? Yes   ? Pain Score 2    was 4/10 after wearing old prosthesis  ? Pain Location Leg   residual limb  ? Pain Orientation Left   ? Pain Descriptors / Indicators Sore   ? Pain Type Chronic pain   ? Pain Onset More than a month ago   ? Pain  Frequency Intermittent   ? Aggravating Factors  wearing old prosthesis   ? Pain Relieving Factors wearing new prosthesis   ? Multiple Pain Sites No   ? Pain Score 0   ? Pain Location Hip   ? Pain Orientation Right   ? Pain Onset More than a month ago   ? Pain Onset Yesterday   ? ?  ?  ? ?  ? ? ? ? ? [facility] PT Assessment - 01/19/22 0803   ? ?  ? Assessment  ? Medical Diagnosis Rt THA (anterior approach)   ? Referring Provider (PT) Carney Bern   ?  ? Ambulation/Gait  ? Ambulation/Gait Assistance 7: Independent   ? Gait velocity 3.64 ft/sec comfortable & 5.00 ft/sec fast pace with prosthesis only   on 11/26/21  3.60ft/sec comfortable, 4.39 ft/sec fast prosthesis only  ? Stairs Assistance 6: Modified independent (Device/Increase time)   verbal cues only  ? Stairs Assistance Details (indicate cue type and reason) pain limits alternating descending >3 steps.  PT instructed how to build number of steps tolerated & pt verbalized understanding.   ? Stair Management Technique Two rails;One rail Right;Alternating pattern;Forwards   2 rails descending except last 3 steps right rail,  ascends right rail only entire flight  ? Number of Stairs 11   ? Height of Stairs 7   ?  ? 6 Minute Walk- Baseline  ? 6 Minute Walk- Baseline yes   ? HR (bpm) 88   ? 02 Sat (%RA) 97 %   ?  ? 6 Minute walk- Post Test  ? 6 Minute Walk Post Test yes   ? HR (bpm) 101   ? 02 Sat (%RA) 97 %   ? Modified Borg Scale for Dyspnea 2- Mild shortness of breath   ? Perceived Rate of Exertion (Borg) 9- very light   ?  ? 6 minute walk test results   ? Aerobic Endurance Distance Walked 1001   1 min seated rest at 48min 15 sec  ? Endurance additional comments muscle cramp in ant-lat residual limb limited distance & required seated rest   ?  ? Berg Balance Test  ? Sit to Stand Able to stand without using hands and stabilize independently   ? Standing Unsupported Able to stand safely 2 minutes   ? Sitting with Back Unsupported but Feet Supported on Floor or Stool Able to  sit safely and securely 2 minutes   ? Stand to Sit Sits safely with minimal use of hands   ? Transfers Able to transfer safely, minor use of hands   ? Standing Unsupported with Eyes Closed Able to stand 10 seconds safely   ? Standing Unsupported with Feet Together Able to place feet together independently and stand 1 minute  safely   ? From Standing, Reach Forward with Outstretched Arm Can reach confidently >25 cm (10")   ? From Standing Position, Pick up Object from [place] to pick up shoe safely and easily   ? From Standing Position, Turn to Look Behind Over each Shoulder Looks behind from both sides and weight shifts well   ? Turn 360 Degrees Able to turn 360 degrees safely in 4 seconds or less   ? Standing Unsupported, Alternately Place Feet on Step/Stool Able to stand independently and safely and complete 8 steps in 20 seconds   ? Standing Unsupported, One Foot in Front Able to plae foot ahead of the other independently and hold 30 seconds   ? Standing on One Leg Tries to lift leg/unable to hold 3 seconds but remains standing independently   ? Total Score 52   ? Berg comment: BERG  < 36 high risk for falls (close to 100%) 46-51 moderate (>50%)   37-45 significant (>80%) 52-55 lower (> 25%)   ?  ? Timed Up and Go Test  ? Normal TUG (seconds) 9.31   9.31 sec on 11/26/21 was 14.12sec pros only  ? Cognitive TUG (seconds) 9.28   9.28sec math, on 3/16 was 17.31sec with prosthesis only math subtracting with 2 errors & stopped naming during turn, >15 sec indicates high fall risk  ? ?  ?  ? ?  ? ? Prosthetics Assessment - 01/19/22 0803   ? ?  ? Prosthetics  ? Prosthetic Care Independent with Skin check;Residual limb care;Care of non-amputated limb;Prosthetic cleaning;Ply sock cleaning;Correct ply sock adjustment;Proper wear schedule/adjustment;Proper weight-bearing schedule/adjustment   ? [place] prosthesis  Independent   ? [place] prosthesis  Independent   ? Current prosthetic wear tolerance (days/week)  daily   ?  Current prosthetic wear tolerance (#hours/day)  most of awake hours   ? ?  ?  ? ?  ? ? ? ? ? ? ? ? ? ? ? ? ? ? ? [facility] PT Treatment/Exercise - 01/19/22 0803   ? ?  ? Ambulation/Gait  ? Ambulation/Gait Yes   ?

## 2022-01-21 ENCOUNTER — Ambulatory Visit (INDEPENDENT_AMBULATORY_CARE_PROVIDER_SITE_OTHER): Payer: BC Managed Care – PPO | Admitting: Physical Therapy

## 2022-01-21 ENCOUNTER — Encounter: Payer: Self-pay | Admitting: Physical Therapy

## 2022-01-21 DIAGNOSIS — R2681 Unsteadiness on feet: Secondary | ICD-10-CM | POA: Diagnosis not present

## 2022-01-21 DIAGNOSIS — R2689 Other abnormalities of gait and mobility: Secondary | ICD-10-CM

## 2022-01-21 DIAGNOSIS — R293 Abnormal posture: Secondary | ICD-10-CM

## 2022-01-21 DIAGNOSIS — M25551 Pain in right hip: Secondary | ICD-10-CM | POA: Diagnosis not present

## 2022-01-21 DIAGNOSIS — M79662 Pain in left lower leg: Secondary | ICD-10-CM

## 2022-01-21 DIAGNOSIS — M6281 Muscle weakness (generalized): Secondary | ICD-10-CM

## 2022-01-21 NOTE — Therapy (Signed)
[facility] ?[address] ?[place], [place], [address] ?Phone: [phone]   Fax:  [phone] ? ?Physical Therapy Treatment & Discharge Summary ? ?Patient Details  ?Name: Anthony Park ?MRN: [id number] ?Date of Birth: 1960/10/12 ?Referring Provider (PT): Carney Bern ? ?PHYSICAL THERAPY DISCHARGE SUMMARY ? ?Visits from Start of Care: 36 ? ?Current functional level related to goals / functional outcomes: ?See below ?  ?Remaining deficits: ?Se below ?  ?Education / Equipment: ?Exercise program including work : rest balance for progressing activity tolerance  and prosthetic care  ? ?Patient agrees to discharge. Patient goals were met. Patient is being discharged due to meeting the stated rehab goals. ? ? ?Encounter Date: 01/21/2022 ? ? PT End of Session - 01/21/22 0804   ? ? Visit Number 36   ? Number of Visits 39   ? Date for PT Re-Evaluation 01/22/22   ? Authorization Type BCBS comm ppo   ? Authorization Time Period 2023 Deductible ($1,450.00)$0.00 Paid $1,450.00 to go, Out-of-Pocket Limit ($2,650.00)  $0.00 Paid $2,650.00 to go   ? Authorization - Visit Number 29   ? Authorization - Number of Visits 116   ? PT Start Time 0800   ? PT Stop Time [phone]   ? PT Time Calculation (min) 33 min   ? Activity Tolerance Patient limited by pain;Patient tolerated treatment well   ? Behavior During Therapy [facility] for tasks assessed/performed   ? ?  ?  ? ?  ? ? ?Past Medical History:  ?Diagnosis Date  ? Arthritis   ? Diabetes mellitus without complication ([place])   ? type 2 controlled with diet   ? Diabetic toe ulcer ([place]) 11/05/2018  ? History of kidney stones   ? passed 1 several years ago  ? Hyperlipidemia   ? Hypertension   ? not on medications  ? Peripheral vascular disease ([place])   ? stent in left leg due to anerysm   ? ? ?Past Surgical History:  ?Procedure Laterality Date  ?  Left Transmetatarsal Ampuation (Left Foot)  08/20/2020  ? ABDOMINAL AORTOGRAM W/LOWER EXTREMITY N/A 08/06/2020  ? Procedure:  ABDOMINAL AORTOGRAM W/LOWER EXTREMITY;  Surgeon: Marty Heck, MD;  Location: [place] [facility];  Service: Cardiovascular;  Laterality: N/A;  ? AMPUTATION Right 11/07/2018  ? Procedure: RIGHT 3RD TOE AMPUTATION;  Surgeon: Newt Minion, MD;  Location: [place];  Service: Orthopedics;  Laterality: Right;  ? AMPUTATION Left 08/20/2020  ? Procedure: Left Transmetatarsal Ampuation;  Surgeon: Marty Heck, MD;  Location: [place];  Service: Vascular;  Laterality: Left;  ? AMPUTATION Left 10/03/2020  ? Procedure: LEFT BELOW KNEE AMPUTATION;  Surgeon: Marty Heck, MD;  Location: [place];  Service: Vascular;  Laterality: Left;  ? AMPUTATION TOE Left   ? big toe and one next to it  ? CATARACT EXTRACTION Bilateral   ? EYE SURGERY Bilateral   ? cataracts removed  ? INSERTION OF ILIAC STENT Left   ? PERIPHERAL VASCULAR BALLOON ANGIOPLASTY Left 08/06/2020  ? Procedure: PERIPHERAL VASCULAR BALLOON ANGIOPLASTY;  Surgeon: Marty Heck, MD;  Location: [place] [facility];  Service: Cardiovascular;  Laterality: Left;  Peroneal artery.  ? TOE AMPUTATION Left 2015  ? TOTAL HIP ARTHROPLASTY Left 05/23/2020  ? Procedure: LEFT TOTAL HIP ARTHROPLASTY ANTERIOR APPROACH;  Surgeon: Mcarthur Rossetti, MD;  [facility];  Service: Orthopedics;  Laterality: Left;  ? TOTAL HIP ARTHROPLASTY Right 07/31/2021  ? Procedure: RIGHT TOTAL HIP ARTHROPLASTY ANTERIOR APPROACH;  Surgeon:  Mcarthur Rossetti, MD;  [facility];  Service: Orthopedics;  Laterality: Right;  ? WOUND DEBRIDEMENT Left 09/11/2020  ? Procedure: DEBRIDEMENT OF LEFT TRANSMETATARSAL WOUND;  Surgeon: Cherre Robins, MD;  Location: [place];  Service: Vascular;  Laterality: Left;  ? ? ?There were no vitals filed for this visit. ? ? Subjective Assessment - 01/21/22 0800   ? ? Subjective He is limited in how long that he can stand. >1 hour increases pain. He reports understanding of PT recommendations for work : rest.   ? Pertinent History Left  TTTA, left THA (05/23/20), arthritis, DM2, HTN, HLD, PVD   ? Patient Stated Goals Wants to use prosthesis to return to some work, get back in community, be mobile, work in gym in his garage   ? Currently in Pain? Yes   ? Pain Score 3    ? Pain Location Leg   [place] limb  ? Pain Orientation Left   ? Pain Onset More than a month ago   ? Multiple Pain Sites No   ? Pain Location Hip   ? Pain Orientation Right   ? Pain Onset More than a month ago   ? Pain Onset Yesterday   ? ?  ?  ? ?  ? ? ? ? ? [facility] PT Assessment - 01/21/22 0800   ? ?  ? Assessment  ? Medical Diagnosis Rt THA (anterior approach)   ? Referring Provider (PT) Carney Bern   ?  ? Observation/Other Assessments  ? Focus on Therapeutic Outcomes (FOTO)  62%   was 42%   ?  ? Ambulation/Gait  ? Ramp 7: Independent   prosthesis only  ? Curb 7: Independent   prosthesis only  ? Curb Details (indicate cue type and reason) able to lead up & down with either LE.   ?  ? 6 Minute Walk- Baseline  ? 6 Minute Walk- Baseline yes   ? HR (bpm) 88   ? 02 Sat (%RA) 97 %   ?  ? 6 Minute walk- Post Test  ? 6 Minute Walk Post Test yes   ? HR (bpm) 101   ? 02 Sat (%RA) 97 %   ? Modified Borg Scale for Dyspnea 2- Mild shortness of breath   ? Perceived Rate of Exertion (Borg) 9- very light   ?  ? 6 minute walk test results   ? Aerobic Endurance Distance Walked 1001   1 min seated rest at 60mn 15 sec  ? Endurance additional comments muscle cramp in ant-lat residual limb limited distance & required seated rest   ?  ? Berg Balance Test  ? Sit to Stand Able to stand without using hands and stabilize independently   ? Standing Unsupported Able to stand safely 2 minutes   ? Sitting with Back Unsupported but Feet Supported on Floor or Stool Able to sit safely and securely 2 minutes   ? Stand to Sit Sits safely with minimal use of hands   ? Transfers Able to transfer safely, minor use of hands   ? Standing Unsupported with Eyes Closed Able to stand 10 seconds safely   ? Standing Unsupported  with Feet Together Able to place feet together independently and stand 1 minute safely   ? From Standing, Reach Forward with Outstretched Arm Can reach confidently >25 cm (10")   ? From Standing Position, [place] safely and easily   ? From Standing Position,  Turn to Look Behind Over each Shoulder Looks behind from both sides and weight shifts well   ? Turn 360 Degrees Able to turn 360 degrees safely in 4 seconds or less   ? Standing Unsupported, Alternately Place Feet on Step/Stool Able to stand independently and safely and complete 8 steps in 20 seconds   ? Standing Unsupported, One Foot in Front Able to plae foot ahead of the other independently and hold 30 seconds   ? Standing on One Leg Tries to lift leg/unable to hold 3 seconds but remains standing independently   ? Total Score 52   ? Berg comment: BERG  < 36 high risk for falls (close to 100%) 46-51 moderate (>50%)   37-45 significant (>80%) 52-55 lower (> 25%)   ?  ? Timed Up and Go Test  ? Normal TUG (seconds) 9.31   9.31 sec on 11/26/21 was 14.12sec pros only  ? Cognitive TUG (seconds) 9.28   9.28sec math, on 3/16 was 17.31sec with prosthesis only math subtracting with 2 errors & stopped naming during turn, >15 sec indicates high fall risk  ?  ? Functional Gait  Assessment  ? Gait assessed  Yes   ? Gait Level Surface Walks 20 ft in less than 5.5 sec, no assistive devices, good speed, no evidence for imbalance, normal gait pattern, deviates no more than 6 in outside of the 12 in walkway width.   ? Change in Gait Speed Able to smoothly change walking speed without loss of balance or gait deviation. Deviate no more than 6 in outside of the 12 in walkway width.   ? Gait with Horizontal Head Turns Performs head turns smoothly with no change in gait. Deviates no more than 6 in outside 12 in walkway width   ? Gait with Vertical Head Turns Performs head turns with no change in gait. Deviates no more than 6 in outside 12 in  walkway width.   ? Gait and Pivot Turn Pivot turns safely within 3 sec and stops quickly with no loss of balance.   ? Step Over Obstacle Is able to step over one shoe box (4.5 in total height) without chang

## 2022-02-10 DIAGNOSIS — S70362A Insect bite (nonvenomous), left thigh, initial encounter: Secondary | ICD-10-CM | POA: Diagnosis not present

## 2022-03-15 ENCOUNTER — Ambulatory Visit (INDEPENDENT_AMBULATORY_CARE_PROVIDER_SITE_OTHER): Payer: BC Managed Care – PPO | Admitting: Physician Assistant

## 2022-03-15 ENCOUNTER — Other Ambulatory Visit: Payer: Self-pay | Admitting: Internal Medicine

## 2022-03-15 ENCOUNTER — Encounter: Payer: Self-pay | Admitting: Physician Assistant

## 2022-03-15 ENCOUNTER — Ambulatory Visit (INDEPENDENT_AMBULATORY_CARE_PROVIDER_SITE_OTHER): Payer: BC Managed Care – PPO

## 2022-03-15 ENCOUNTER — Encounter: Payer: BC Managed Care – PPO | Admitting: Orthopedic Surgery

## 2022-03-15 DIAGNOSIS — Z96641 Presence of right artificial hip joint: Secondary | ICD-10-CM

## 2022-03-15 DIAGNOSIS — Z87891 Personal history of nicotine dependence: Secondary | ICD-10-CM

## 2022-03-15 MED ORDER — DOXYCYCLINE HYCLATE 100 MG PO TABS: 100.0000 mg | ORAL_TABLET | Freq: Two times a day (BID) | ORAL | 0 refills | Status: DC

## 2022-03-15 NOTE — Progress Notes (Signed)
HPI: [name] returns today status post right total hip arthroplasty 07/31/2021.  He states that his hip is doing well.  Denies any pain whatsoever in the right hip.  He wants a wound on his lower leg checked today.  He is worried about infection.  He is having some problems with his left leg aching where he has below-knee amputation.  He has been physical therapy for this and discharged with therapy.  He sees Dr. Lajoyce Corners.  Review of systems: See HPI otherwise negative  Physical exam: General well-developed well-nourished male no acute distress mood affect appropriate. Right hip: Excellent range of motion of the hip without pain.  Surgical incisions well-healed.  Right tibial shin area there is an area of erythema with scab but no signs of gross infection.  Radiographs:AP pelvis lateral view right hip: Status post right total hip arthroplasty with well-seated components.  No acute fractures.  Bilateral hips well located.  Status post bilateral total hip arthroplasties.  Impression: Status post right total hip arthroplasty 07/31/2021  Plan: He will follow-up with [place] in October for an AP pelvis lateral view of the right hip.  Follow-up sooner if there is any questions concerns.  Did send in doxycycline for the wound over the right lower leg.  He will wash this area with antibacterial soap.  In regards to his BKA pain he will follow-up with Dr. Lajoyce Corners.  Questions were encouraged and answered at length.

## 2022-03-17 DIAGNOSIS — E1142 Type 2 diabetes mellitus with diabetic polyneuropathy: Secondary | ICD-10-CM | POA: Diagnosis not present

## 2022-03-21 IMAGING — XA DG HIP (WITH OR WITHOUT PELVIS) 2-3V*R*
1 series · 3 of 3 positions shown · non-contrast
Comparison: 06/03/2021

CLINICAL DATA: Fluoroscopic assistance for right knee arthroplasty

EXAM:
DG HIP (WITH OR WITHOUT PELVIS) 2-3V RIGHT

[Series 1: unknown protocol · 3 of 3 slices shown]
[im 1/3]
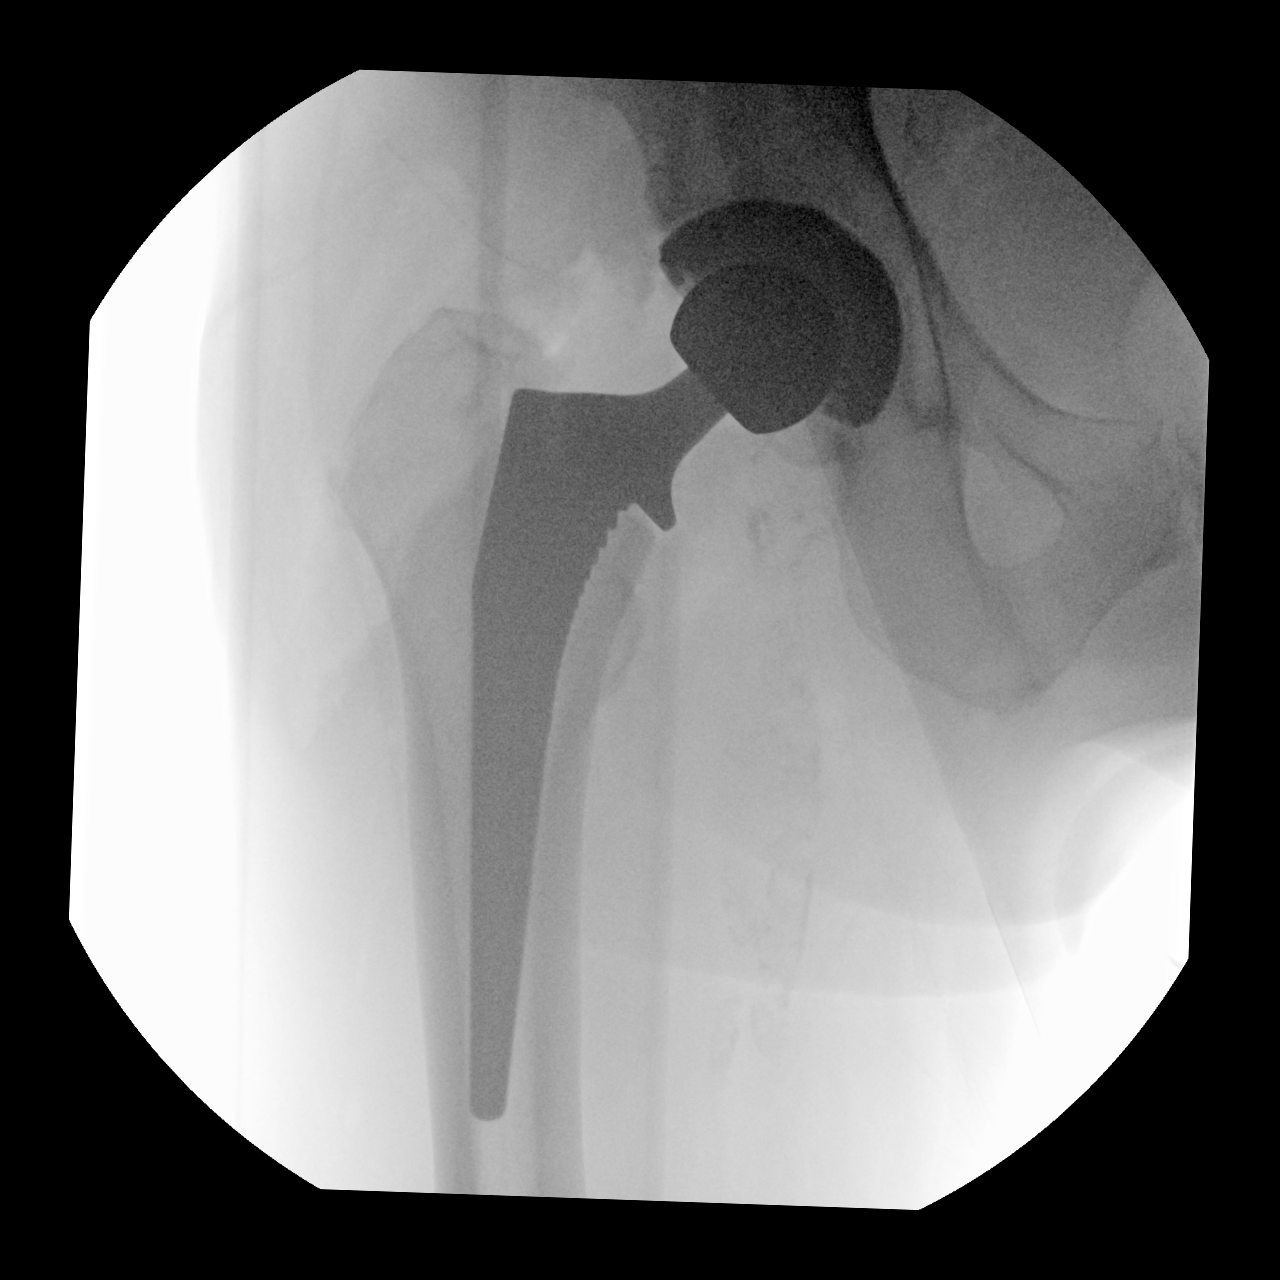
[im 2/3]
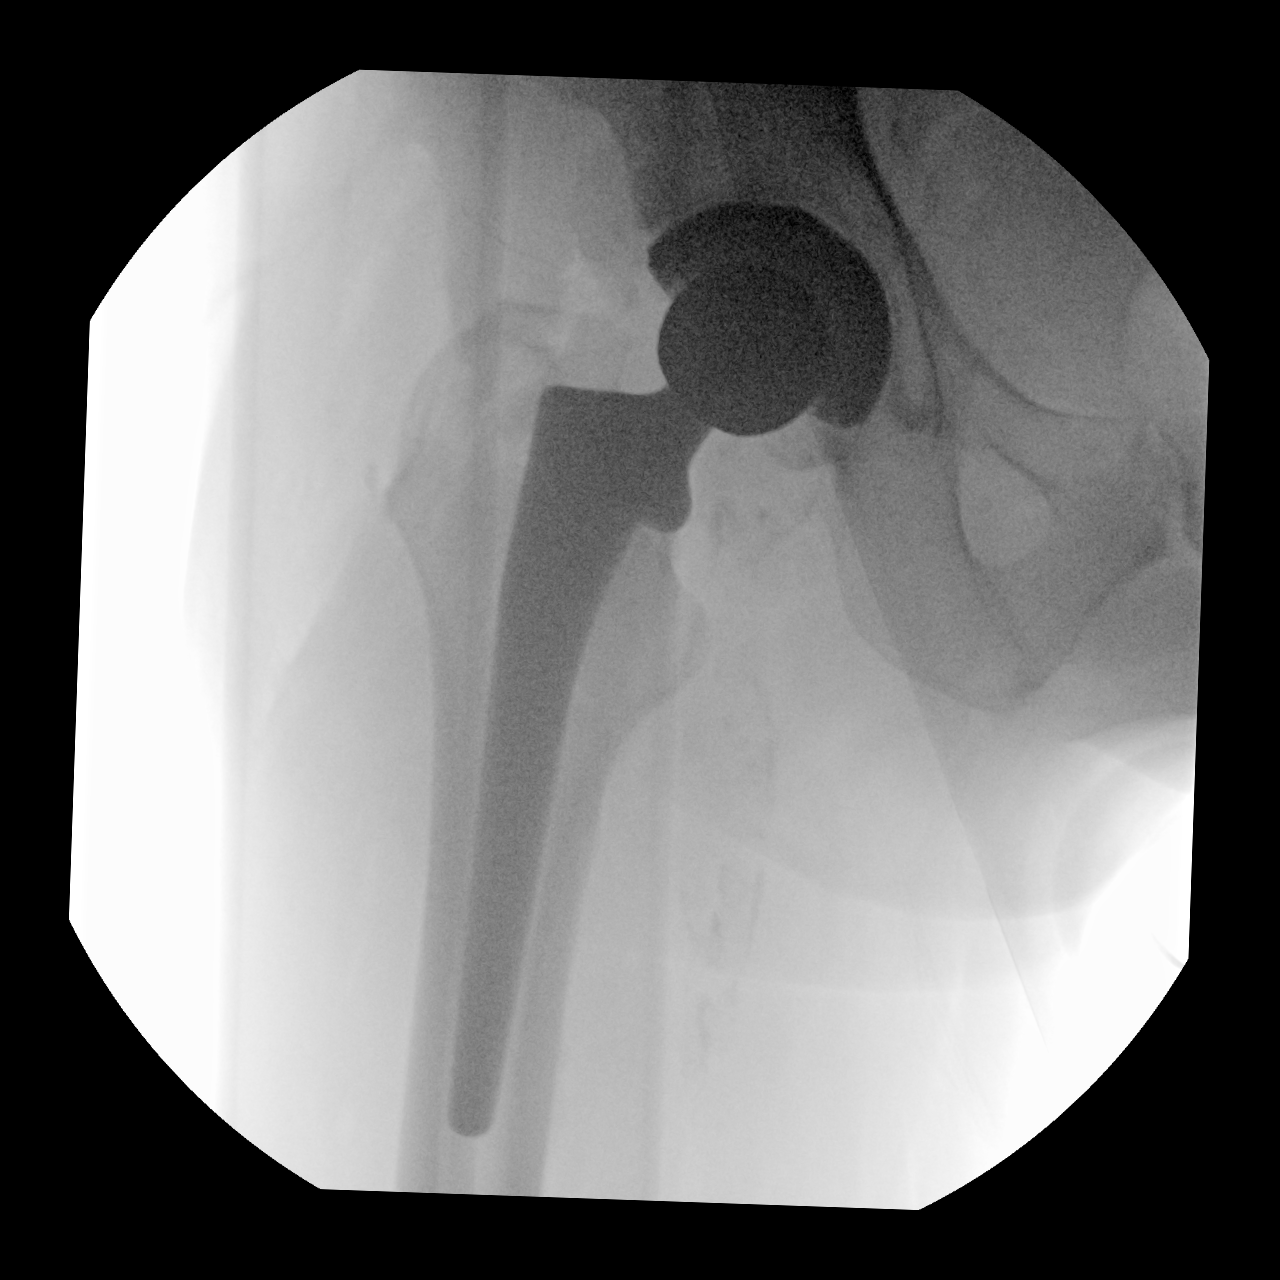
[im 3/3]
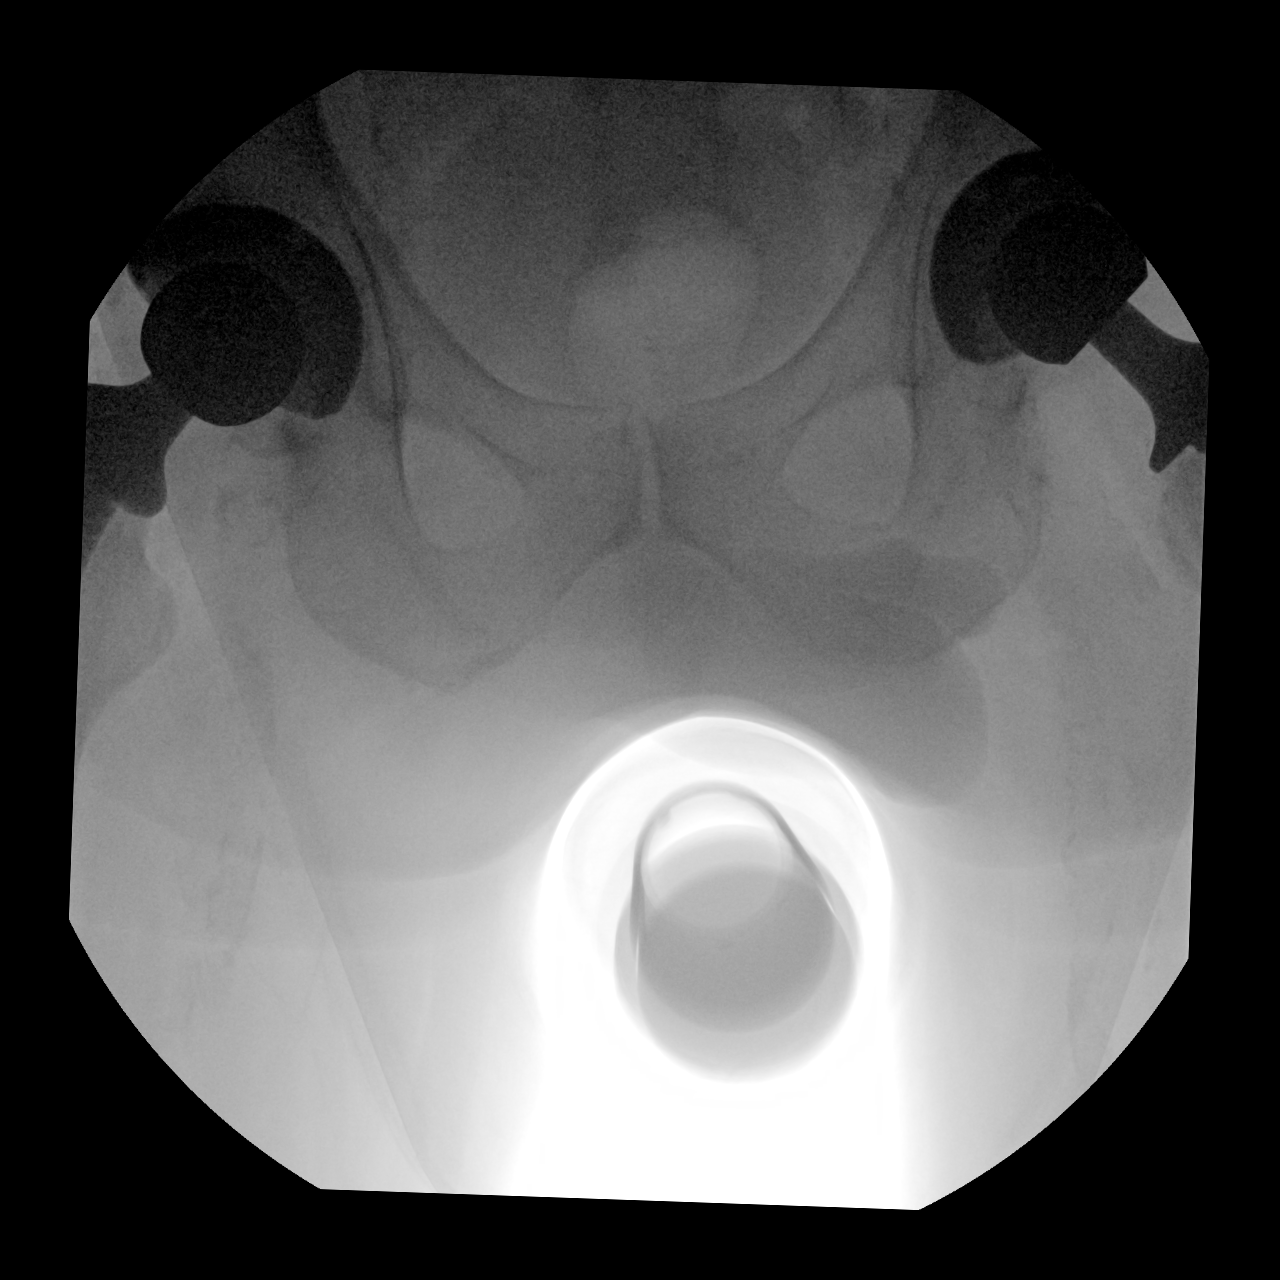

[3 of 3 positions shown; findings below may reference images not displayed]

FINDINGS: There is interval right hip arthroplasty. Fluoroscopic time was 27
seconds. Radiation dose is 2.46 mGy. Three fluoroscopic images are
submitted for review.
IMPRESSION: Fluoroscopic assistance was provided for right hip arthroplasty.

## 2022-03-21 IMAGING — DX DG PORTABLE PELVIS
1 series · 1 of 1 positions shown · non-contrast
Comparison: 05/23/2020

CLINICAL DATA: Right hip arthroplasty

EXAM:
PORTABLE PELVIS 1-2 VIEWS

[pelvis ap]
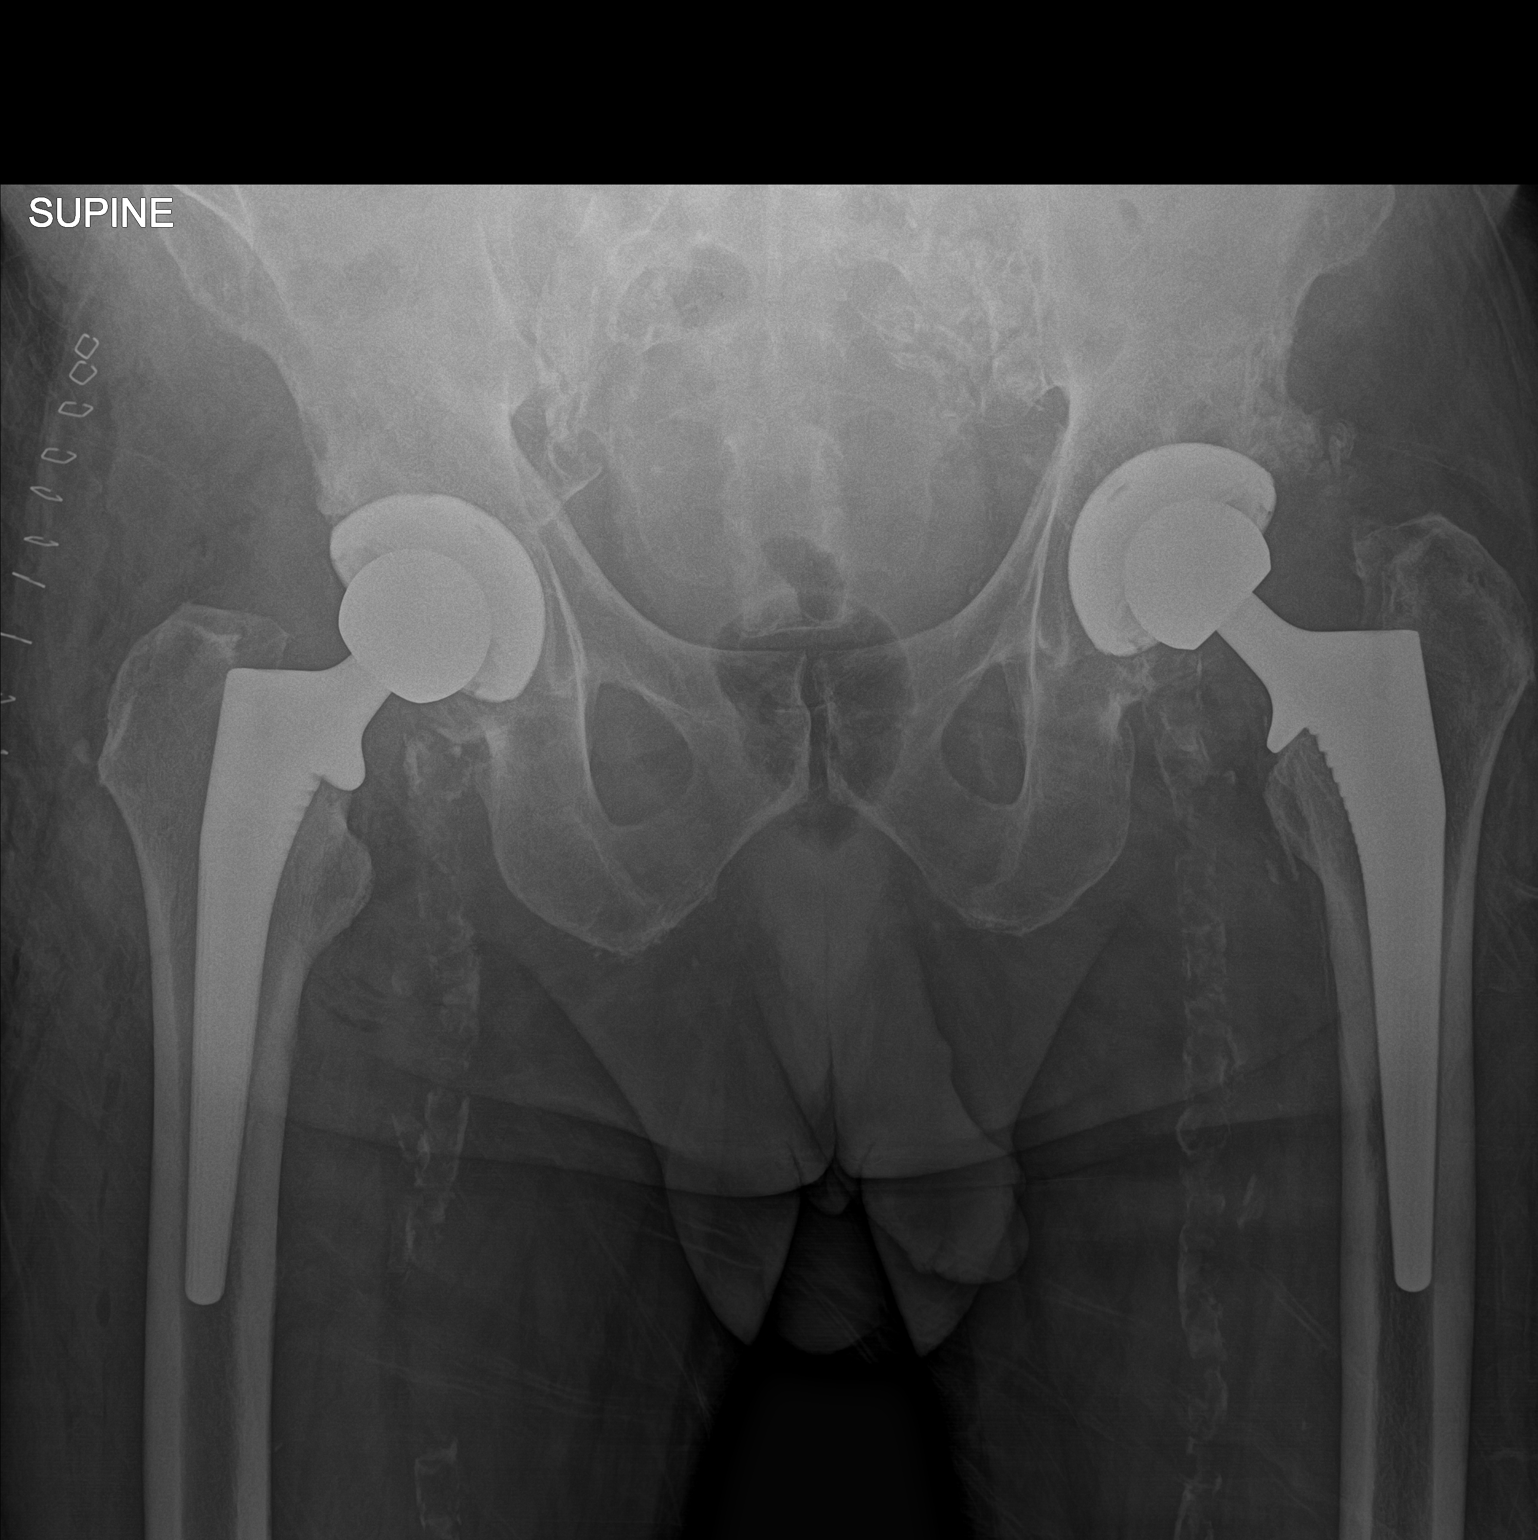

[1 of 1 positions shown; findings below may reference images not displayed]

FINDINGS: There is interval right hip arthroplasty. Skin staples are noted
lateral to the right hip. Extensive arterial calcifications are
seen. There is previous left hip arthroplasty.
IMPRESSION: Interval right hip arthroplasty.

## 2022-04-05 ENCOUNTER — Ambulatory Visit (INDEPENDENT_AMBULATORY_CARE_PROVIDER_SITE_OTHER): Payer: BC Managed Care – PPO | Admitting: Orthopedic Surgery

## 2022-04-05 DIAGNOSIS — S88112A Complete traumatic amputation at level between knee and ankle, left lower leg, initial encounter: Secondary | ICD-10-CM

## 2022-04-05 DIAGNOSIS — Z89512 Acquired absence of left leg below knee: Secondary | ICD-10-CM

## 2022-04-06 ENCOUNTER — Encounter: Payer: Self-pay | Admitting: Orthopedic Surgery

## 2022-04-06 NOTE — Progress Notes (Signed)
Office Visit Note   Patient: Anthony Park           Date of Birth: 05/01/1961           MRN: [id number] Visit Date: 04/05/2022              Requested by: Melida Quitter, MD [address] [place],  [place] [address] PCP: Melida Quitter, MD  Chief Complaint  Patient presents with   Left Leg - Follow-up    Hx left BKA      HPI: Patient is a 61 year old gentleman who is status post left transtibial amputation he is currently had his prosthetic leg for over 6 months.  Patient states that he has been extremely active on his feet and his job has required him to be on his feet.  He states that he currently can only walk about 300 to 400 yards before his leg starts cramping.  With standing he has to sit down after 20 to 30 minutes.  Assessment & Plan: Visit Diagnoses:  1. Below-knee amputation of left lower extremity (HCC)     Plan: Patient is provided a prescription for [employer] for a suction socket.  This should allow patient to increase his activities.  He previously has worked 12 hours a day standing currently would require seated work.  Patient is currently totally disabled from his previous level of work.  Follow-Up Instructions: Return if symptoms worsen or fail to improve.   Ortho Exam  Patient is alert, oriented, no adenopathy, well-dressed, normal affect, normal respiratory effort. Examination patient's leg is in valgus alignment.  He is subsiding in the socket there are no end bearing ulcers he does have callus.  Patient is an existing left transtibial  amputee.  Patient's current comorbidities are not expected to impact the ability to function with the prescribed prosthesis. Patient verbally communicates a strong desire to use a prosthesis. Patient currently requires mobility aids to ambulate without a prosthesis.  Expects not to use mobility aids with a new prosthesis.  Patient is a K3 level ambulator that spends a lot of time walking around on uneven terrain over obstacles,  up and down stairs, and ambulates with a variable cadence.     Imaging: No results found. No images are attached to the encounter.  Labs: Lab Results  Component Value Date   HGBA1C 6.5 (H) 07/24/2021   HGBA1C 6.2 (H) 10/03/2020   HGBA1C 6.8 (H) 05/20/2020   CRP 3.8 (H) 10/03/2020   CRP 53.7 (H) 09/23/2020   LABURIC 6.9 07/29/2020   REPTSTATUS 09/16/2020 FINAL 09/11/2020   GRAMSTAIN  09/11/2020    RARE WBC PRESENT, PREDOMINANTLY MONONUCLEAR MODERATE GRAM NEGATIVE RODS FEW GRAM VARIABLE ROD    CULT  09/11/2020    MODERATE PSEUDOMONAS AERUGINOSA RARE ESCHERICHIA COLI NO ANAEROBES ISOLATED Performed at [facility], [address], [place], [place] [address]    LABORGA PSEUDOMONAS AERUGINOSA 09/11/2020   LABORGA ESCHERICHIA COLI 09/11/2020     Lab Results  Component Value Date   ALBUMIN 3.4 (L) 10/03/2020   ALBUMIN 3.6 09/11/2020   ALBUMIN 3.5 11/06/2018    Lab Results  Component Value Date   MG 1.9 08/24/2020   MG 1.6 (L) 08/23/2020   No results found for: "VD25OH"  No results found for: "PREALBUMIN"    Latest Ref Rng & Units 07/24/2021    8:38 AM 10/06/2020    3:42 AM 10/05/2020    6:14 AM  CBC EXTENDED  WBC 4.0 -  10.5 K/uL 8.4  7.4  8.0   RBC 4.22 - 5.81 MIL/uL 4.89  2.83  2.80   Hemoglobin 13.0 - 17.0 g/dL 84.1  8.1  8.1   HCT 32.4 - 52.0 % 40.4  26.1  25.4   Platelets 150 - 400 K/uL 240  254  262      There is no height or weight on file to calculate BMI.  Orders:  No orders of the defined types were placed in this encounter.  No orders of the defined types were placed in this encounter.    Procedures: No procedures performed  Clinical Data: No additional findings.  ROS:  All other systems negative, except as noted in the HPI. Review of Systems  Objective: Vital Signs: There were no vitals taken for this visit.  Specialty Comments:  No specialty comments available.  PMFS History: Patient Active Problem List   Diagnosis  Date Noted   S/P BKA (below knee amputation) unilateral, left (HCC) 10/03/2020   Non-healing wound of lower extremity 10/03/2020   History of amputation of left foot through metatarsal bone (HCC) 09/11/2020   PAD (peripheral artery disease) (HCC) 08/20/2020   Status post total replacement of left hip 06/05/2020   Unilateral primary osteoarthritis, right hip 04/17/2020   Unilateral primary osteoarthritis, left hip 04/17/2020   Amputated toe, right (HCC) 11/22/2018   Type 2 diabetes mellitus with foot ulcer, without long-term current use of insulin (HCC) 11/22/2018   Mixed hyperlipidemia 11/22/2018   Aneurysm of left popliteal artery (HCC) 11/22/2018   Status post peripheral artery angioplasty with insertion of stent 11/22/2018   Osteomyelitis of third toe of right foot (HCC)    Cellulitis of third toe of right foot    Past Medical History:  Diagnosis Date   Arthritis    Diabetes mellitus without complication (HCC)    type 2 controlled with diet    Diabetic toe ulcer (HCC) 11/05/2018   History of kidney stones    passed 1 several years ago   Hyperlipidemia    Hypertension    not on medications   Peripheral vascular disease (HCC)    stent in left leg due to anerysm     Family History  Problem Relation Age of Onset   COPD Mother    Cataracts Mother    Brain cancer Father    Hypertension Brother     Past Surgical History:  Procedure Laterality Date    Left Transmetatarsal Ampuation (Left Foot)  08/20/2020   ABDOMINAL AORTOGRAM W/LOWER EXTREMITY N/A 08/06/2020   Procedure: ABDOMINAL AORTOGRAM W/LOWER EXTREMITY;  Surgeon: [name], MD;  [facility];  Service: Cardiovascular;  Laterality: N/A;   AMPUTATION Right 11/07/2018   Procedure: RIGHT 3RD TOE AMPUTATION;  Surgeon: Nadara Mustard, MD;  Location: [facility] OR;  Service: Orthopedics;  Laterality: Right;   AMPUTATION Left 08/20/2020   Procedure: Left Transmetatarsal Ampuation;  Surgeon: [name], MD;  Location: [facility] OR;  Service: Vascular;  Laterality: Left;   AMPUTATION Left 10/03/2020   Procedure: LEFT BELOW KNEE AMPUTATION;  Surgeon: [name], MD;  Location: [facility] OR;  Service: Vascular;  Laterality: Left;   AMPUTATION TOE Left    big toe and one next to it   CATARACT EXTRACTION Bilateral    EYE SURGERY Bilateral    cataracts removed   INSERTION OF ILIAC STENT Left    PERIPHERAL VASCULAR BALLOON ANGIOPLASTY Left 08/06/2020   Procedure: PERIPHERAL VASCULAR BALLOON ANGIOPLASTY;  Surgeon:  [name], MD;  Location: [facility] INVASIVE [facility] LAB;  Service: Cardiovascular;  Laterality: Left;  Peroneal artery.   TOE AMPUTATION Left 2015   TOTAL HIP ARTHROPLASTY Left 05/23/2020   Procedure: LEFT TOTAL HIP ARTHROPLASTY ANTERIOR APPROACH;  Surgeon: Kathryne Hitch, MD;  Location: [facility];  Service: Orthopedics;  Laterality: Left;   TOTAL HIP ARTHROPLASTY Right 07/31/2021   Procedure: RIGHT TOTAL HIP ARTHROPLASTY ANTERIOR APPROACH;  Surgeon: Kathryne Hitch, MD;  Location: [facility];  Service: Orthopedics;  Laterality: Right;   WOUND DEBRIDEMENT Left 09/11/2020   Procedure: DEBRIDEMENT OF LEFT TRANSMETATARSAL WOUND;  Surgeon: Leonie Douglas, MD;  Location: [facility] OR;  Service: Vascular;  Laterality: Left;   Social History   Occupational History   Not on file  Tobacco Use   Smoking status: Former    Packs/day: 0.50    Years: 41.00    Total pack years: 20.50    Types: Cigarettes    Quit date: 07/20/2020    Years since quitting: 1.7   Smokeless tobacco: Never  Vaping Use   Vaping Use: Never used  Substance and Sexual Activity   Alcohol use: Yes    Alcohol/week: 14.0 standard drinks of alcohol    Types: 14 Standard drinks or equivalent per week    Comment: occas.   Drug use: Not Currently    Comment: hx of marijuana 15 years ago    Sexual activity: Yes

## 2022-04-13 ENCOUNTER — Telehealth: Payer: Self-pay | Admitting: Orthopedic Surgery

## 2022-04-13 NOTE — Telephone Encounter (Signed)
[employer] forms received. To [employer]. °

## 2022-04-23 ENCOUNTER — Telehealth: Payer: Self-pay | Admitting: Physician Assistant

## 2022-04-23 NOTE — Telephone Encounter (Signed)
Received medical records release form and $25.00 check/Forwarding to [employer] today 

## 2022-04-26 ENCOUNTER — Ambulatory Visit
Admission: RE | Admit: 2022-04-26 | Discharge: 2022-04-26 | Disposition: A | Discharge status: Home / Self Care | Payer: BC Managed Care – PPO | Source: Ambulatory Visit | Attending: Internal Medicine | Admitting: Internal Medicine

## 2022-04-26 DIAGNOSIS — Z87891 Personal history of nicotine dependence: Secondary | ICD-10-CM | POA: Diagnosis not present

## 2022-06-04 DIAGNOSIS — Z89512 Acquired absence of left leg below knee: Secondary | ICD-10-CM | POA: Diagnosis not present

## 2022-07-06 DIAGNOSIS — I739 Peripheral vascular disease, unspecified: Secondary | ICD-10-CM | POA: Diagnosis not present

## 2022-07-15 ENCOUNTER — Ambulatory Visit (INDEPENDENT_AMBULATORY_CARE_PROVIDER_SITE_OTHER): Payer: BC Managed Care – PPO

## 2022-07-15 ENCOUNTER — Ambulatory Visit (INDEPENDENT_AMBULATORY_CARE_PROVIDER_SITE_OTHER): Payer: BC Managed Care – PPO | Admitting: Podiatry

## 2022-07-15 ENCOUNTER — Encounter: Payer: Self-pay | Admitting: Podiatry

## 2022-07-15 DIAGNOSIS — M2041 Other hammer toe(s) (acquired), right foot: Secondary | ICD-10-CM | POA: Diagnosis not present

## 2022-07-15 DIAGNOSIS — E1151 Type 2 diabetes mellitus with diabetic peripheral angiopathy without gangrene: Secondary | ICD-10-CM | POA: Diagnosis not present

## 2022-07-15 NOTE — Progress Notes (Signed)
Subjective:   Patient ID: Anthony Park, male   DOB: 61 y.o.   MRN: [id number]   HPI Patient presents with hammertoe second digit right that is rigid in its nature with history of amputation of the third digit and BKA amputation left secondary to infection with patient having long-term vascular disease history of smoking and diabetes.  Patient no longer smokes and is trying to be more active   Review of Systems  All other systems reviewed and are negative.       Objective:  Physical Exam Vitals and nursing note reviewed.  Constitutional:      Appearance: He is well-developed.  Pulmonary:     Effort: Pulmonary effort is normal.  Musculoskeletal:        General: Normal range of motion.  Skin:    General: Skin is warm.  Neurological:     Mental Status: He is alert.     Vascular status right mildly diminished but intact with patient found to have diminished hair growth noted diminished vibratory sharp dull and severe rigid contracture digit to right some trauma of the right hallux but localized with no erythema edema drainage and minimal other pathology.  BK amputation noted left     Assessment:  High risk patient with severe digital deformity digit to right rigid contracture and what appears to be adequate circulatory status on the right side     Plan:  H&P reviewed x-ray and I discussed the importance of daily activities and I do not recommend digital fusion at 1 point in future amputation second toe may be necessary.  At this point its not infected just irritated we did apply cushioning to it and I explained shoe gear modifications and he will be seen back as needed with thorough instructions given to him today concerning vascular disease diabetes and his condition  X-rays indicate rigid contracture digit to right with amputation third digit right

## 2022-07-26 ENCOUNTER — Encounter: Payer: Self-pay | Admitting: Physician Assistant

## 2022-07-26 ENCOUNTER — Ambulatory Visit (INDEPENDENT_AMBULATORY_CARE_PROVIDER_SITE_OTHER): Payer: BC Managed Care – PPO | Admitting: Physician Assistant

## 2022-07-26 ENCOUNTER — Ambulatory Visit (INDEPENDENT_AMBULATORY_CARE_PROVIDER_SITE_OTHER): Payer: BC Managed Care – PPO

## 2022-07-26 DIAGNOSIS — Z96641 Presence of right artificial hip joint: Secondary | ICD-10-CM

## 2022-07-26 NOTE — Progress Notes (Signed)
Office Visit Note   Patient: Anthony Park           Date of Birth: 1961/01/11           MRN: [id number] Visit Date: 07/26/2022              Requested by: Melida Quitter, MD [address] [place],  [place] [address]     PCP: Melida Quitter, MD   Assessment & Plan: Visit Diagnoses:  1. Status post total replacement of right hip     Plan: He will follow-up with [place] as needed.  Questions were encouraged and answered at length today.  Follow-Up Instructions: Return if symptoms worsen or fail to improve.   Orders:  Orders Placed This Encounter  Procedures   XR HIP UNILAT W OR W/O PELVIS 2-3 VIEWS RIGHT   No orders of the defined types were placed in this encounter.     Procedures: No procedures performed   Clinical Data: No additional findings.   Subjective: Chief Complaint  Patient presents with   Right Hip - Routine Post Op    HPI Patient returns today status post right total hip arthroplasty 07/21/2021.  He states the hip is doing well.  He is back to working out the gym.  He feels overall he is getting stronger.  He has no pain in the hip whatsoever.  Review of Systems See HPI otherwise negative or noncontributory.  Objective: Vital Signs: There were no vitals taken for this visit.  Physical Exam General: Pleasant male in no acute distress.  Ambulates without any assistive device. Ortho Exam Bilateral hips excellent range of motion without pain. Specialty Comments:  No specialty comments available.  Imaging: XR HIP UNILAT W OR W/O PELVIS 2-3 VIEWS RIGHT  Result Date: 07/26/2022 AP pelvis and 2 views of the right hip: Bilateral hips well located.  No acute fractures or acute findings.  Status post bilateral total hip arthroplasties with well-seated components.  No hardware failure.  Arthrosclerosis femoral vessels bilaterally.    PMFS History: Patient Active Problem List   Diagnosis Date Noted   S/P BKA (below knee amputation) unilateral, left (HCC)  10/03/2020   Non-healing wound of lower extremity 10/03/2020   History of amputation of left foot through metatarsal bone (HCC) 09/11/2020   PAD (peripheral artery disease) (HCC) 08/20/2020   Status post total replacement of left hip 06/05/2020   Unilateral primary osteoarthritis, right hip 04/17/2020   Unilateral primary osteoarthritis, left hip 04/17/2020   Amputated toe, right (HCC) 11/22/2018   Type 2 diabetes mellitus with foot ulcer, without long-term current use of insulin (HCC) 11/22/2018   Mixed hyperlipidemia 11/22/2018   Aneurysm of left popliteal artery (HCC) 11/22/2018   Status post peripheral artery angioplasty with insertion of stent 11/22/2018   Osteomyelitis of third toe of right foot (HCC)    Cellulitis of third toe of right foot    Past Medical History:  Diagnosis Date   Arthritis    Diabetes mellitus without complication (HCC)    type 2 controlled with diet    Diabetic toe ulcer (HCC) 11/05/2018   History of kidney stones    passed 1 several years ago   Hyperlipidemia    Hypertension    not on medications   Peripheral vascular disease (HCC)    stent in left leg due to anerysm     Family History  Problem Relation Age of Onset   COPD Mother    Cataracts Mother  Brain cancer Father    Hypertension Brother     Past Surgical History:  Procedure Laterality Date    Left Transmetatarsal Ampuation (Left Foot)  08/20/2020   ABDOMINAL AORTOGRAM W/LOWER EXTREMITY N/A 08/06/2020   Procedure: ABDOMINAL AORTOGRAM W/LOWER EXTREMITY;  Surgeon: [name], MD;  Location: [facility];  Service: Cardiovascular;  Laterality: N/A;   AMPUTATION Right 11/07/2018   Procedure: RIGHT 3RD TOE AMPUTATION;  Surgeon: Nadara Mustard, MD;  Location: [facility] OR;  Service: Orthopedics;  Laterality: Right;   AMPUTATION Left 08/20/2020   Procedure: Left Transmetatarsal Ampuation;  Surgeon: [name], MD;  Location: [facility] OR;  Service: Vascular;  Laterality: Left;    AMPUTATION Left 10/03/2020   Procedure: LEFT BELOW KNEE AMPUTATION;  Surgeon: [name], MD;  Location: [facility] OR;  Service: Vascular;  Laterality: Left;   AMPUTATION TOE Left    big toe and one next to it   CATARACT EXTRACTION Bilateral    EYE SURGERY Bilateral    cataracts removed   INSERTION OF ILIAC STENT Left    PERIPHERAL VASCULAR BALLOON ANGIOPLASTY Left 08/06/2020   Procedure: PERIPHERAL VASCULAR BALLOON ANGIOPLASTY;  Surgeon: [name], MD;  Location: [facility];  Service: Cardiovascular;  Laterality: Left;  Peroneal artery.   TOE AMPUTATION Left 2015   TOTAL HIP ARTHROPLASTY Left 05/23/2020   Procedure: LEFT TOTAL HIP ARTHROPLASTY ANTERIOR APPROACH;  Surgeon: Kathryne Hitch, MD;  Location: [facility];  Service: Orthopedics;  Laterality: Left;   TOTAL HIP ARTHROPLASTY Right 07/31/2021   Procedure: RIGHT TOTAL HIP ARTHROPLASTY ANTERIOR APPROACH;  Surgeon: Kathryne Hitch, MD;  Location: [facility];  Service: Orthopedics;  Laterality: Right;   WOUND DEBRIDEMENT Left 09/11/2020   Procedure: DEBRIDEMENT OF LEFT TRANSMETATARSAL WOUND;  Surgeon: Leonie Douglas, MD;  Location: [facility] OR;  Service: Vascular;  Laterality: Left;   Social History   Occupational History   Not on file  Tobacco Use   Smoking status: Former    Packs/day: 0.50    Years: 41.00    Total pack years: 20.50    Types: Cigarettes    Quit date: 07/20/2020    Years since quitting: 2.0   Smokeless tobacco: Never  Vaping Use   Vaping Use: Never used  Substance and Sexual Activity   Alcohol use: Yes    Alcohol/week: 14.0 standard drinks of alcohol    Types: 14 Standard drinks or equivalent per week    Comment: occas.   Drug use: Not Currently    Comment: hx of marijuana 15 years ago    Sexual activity: Yes

## 2022-07-27 DIAGNOSIS — Z23 Encounter for immunization: Secondary | ICD-10-CM | POA: Diagnosis not present

## 2022-07-27 DIAGNOSIS — I739 Peripheral vascular disease, unspecified: Secondary | ICD-10-CM | POA: Diagnosis not present

## 2022-07-27 DIAGNOSIS — E782 Mixed hyperlipidemia: Secondary | ICD-10-CM | POA: Diagnosis not present

## 2022-07-27 DIAGNOSIS — E1142 Type 2 diabetes mellitus with diabetic polyneuropathy: Secondary | ICD-10-CM | POA: Diagnosis not present

## 2022-10-06 ENCOUNTER — Telehealth: Payer: Self-pay | Admitting: Orthopedic Surgery

## 2022-10-06 NOTE — Telephone Encounter (Signed)
Pt with hx of left BKA order written and faxed to [facility].

## 2022-10-06 NOTE — Telephone Encounter (Signed)
Patient came by. [employer] needs a RX for supplies. Liners

## 2022-11-03 DIAGNOSIS — Z89512 Acquired absence of left leg below knee: Secondary | ICD-10-CM | POA: Diagnosis not present

## 2022-11-05 ENCOUNTER — Telehealth: Payer: Self-pay

## 2022-11-05 NOTE — Telephone Encounter (Signed)
Dr. Alan Mulder from [employer] called to determine the pt's "return to work status" as he is has been on disability. Pt has not been seen in our office since May 2022. I have offered to make pt an appt for this and Dr. Mar Daring declined.

## 2022-11-09 ENCOUNTER — Telehealth: Payer: Self-pay | Admitting: Orthopedic Surgery

## 2022-11-09 DIAGNOSIS — R7989 Other specified abnormal findings of blood chemistry: Secondary | ICD-10-CM | POA: Diagnosis not present

## 2022-11-09 DIAGNOSIS — E782 Mixed hyperlipidemia: Secondary | ICD-10-CM | POA: Diagnosis not present

## 2022-11-09 DIAGNOSIS — I10 Essential (primary) hypertension: Secondary | ICD-10-CM | POA: Diagnosis not present

## 2022-11-09 DIAGNOSIS — E1142 Type 2 diabetes mellitus with diabetic polyneuropathy: Secondary | ICD-10-CM | POA: Diagnosis not present

## 2022-11-09 DIAGNOSIS — D649 Anemia, unspecified: Secondary | ICD-10-CM | POA: Diagnosis not present

## 2022-11-09 NOTE — Telephone Encounter (Signed)
Patient called. He would like a RX for a new socket sent to [facility]. His call back number is [phone]

## 2022-11-10 NOTE — Telephone Encounter (Signed)
Can you please call pt and make an appt for him to come at first available? Ne wants rx for new prosthetic socket but he has not been in since 03/2022 and insurance will require that he have a visit first before they will accept the rx. Thanks!

## 2022-11-18 ENCOUNTER — Encounter: Payer: Self-pay | Admitting: Radiology

## 2022-11-22 ENCOUNTER — Encounter: Payer: Self-pay | Admitting: Orthopedic Surgery

## 2022-11-22 ENCOUNTER — Ambulatory Visit (INDEPENDENT_AMBULATORY_CARE_PROVIDER_SITE_OTHER): Payer: BC Managed Care – PPO | Admitting: Orthopedic Surgery

## 2022-11-22 DIAGNOSIS — Z96641 Presence of right artificial hip joint: Secondary | ICD-10-CM | POA: Diagnosis not present

## 2022-11-22 NOTE — Progress Notes (Signed)
Office Visit Note   Patient: Anthony Park           Date of Birth: [birth year]           MRN: [id number] Visit Date: 11/22/2022              Requested by: Michael Boston, MD [address] [place],  [place] [address] PCP: Michael Boston, MD  Chief Complaint  Patient presents with   Left Leg - Follow-up    Hx left BKA 10/03/2020      HPI: Patient is a 62 year old gentleman who is over a year status post left transtibial amputation.  Patient states he has been working out in the gym has had increased muscle girth and cannot fit his leg into the socket on the left.  Patient complains of skin breakdown and pressure points anterior laterally.  Assessment & Plan: Visit Diagnoses:  1. Status post total replacement of right hip     Plan: Patient is provided a prescription for [employer] for new socket liner materials and supplies.  Follow-Up Instructions: No follow-ups on file.   Ortho Exam  Patient is alert, oriented, no adenopathy, well-dressed, normal affect, normal respiratory effort. Examination patient has skin breakdown anterior lateral of the left leg.  He has increased residual volume.  Patient is an existing left transtibial  amputee.  Patient's current comorbidities are not expected to impact the ability to function with the prescribed prosthesis. Patient verbally communicates a strong desire to use a prosthesis. Patient currently requires mobility aids to ambulate without a prosthesis.  Expects not to use mobility aids with a new prosthesis.  Patient is a K3 level ambulator that spends a lot of time walking around on uneven terrain over obstacles, up and down stairs, and ambulates with a variable cadence.     Imaging: No results found. No images are attached to the encounter.  Labs: Lab Results  Component Value Date   HGBA1C 6.5 (H) 07/24/2021   HGBA1C 6.2 (H) 10/03/2020   HGBA1C 6.8 (H) 05/20/2020   CRP 3.8 (H) 10/03/2020   CRP 53.7 (H) 09/23/2020   LABURIC 6.9  07/29/2020   REPTSTATUS 09/16/2020 FINAL 09/11/2020   GRAMSTAIN  09/11/2020    RARE WBC PRESENT, PREDOMINANTLY MONONUCLEAR MODERATE GRAM NEGATIVE RODS FEW GRAM VARIABLE ROD    CULT  09/11/2020    MODERATE PSEUDOMONAS AERUGINOSA RARE ESCHERICHIA COLI NO ANAEROBES ISOLATED Performed at [facility], [place] [address], [place], [place] [address]    LABORGA PSEUDOMONAS AERUGINOSA 09/11/2020   LABORGA ESCHERICHIA COLI 09/11/2020     Lab Results  Component Value Date   ALBUMIN 3.4 (L) 10/03/2020   ALBUMIN 3.6 09/11/2020   ALBUMIN 3.5 11/06/2018    Lab Results  Component Value Date   MG 1.9 08/24/2020   MG 1.6 (L) 08/23/2020   No results found for: "VD25OH"  No results found for: "PREALBUMIN"    Latest Ref Rng & Units 07/24/2021    8:38 AM 10/06/2020    3:42 AM 10/05/2020    6:14 AM  CBC EXTENDED  WBC 4.0 - 10.5 K/uL 8.4  7.4  8.0   RBC 4.22 - 5.81 MIL/uL 4.89  2.83  2.80   Hemoglobin 13.0 - 17.0 g/dL 13.2  8.1  8.1   HCT 39.0 - 52.0 % 40.4  26.1  25.4   Platelets 150 - 400 K/uL 240  254  262      There is no height or weight on  file to calculate BMI.  Orders:  No orders of the defined types were placed in this encounter.  No orders of the defined types were placed in this encounter.    Procedures: No procedures performed  Clinical Data: No additional findings.  ROS:  All other systems negative, except as noted in the HPI. Review of Systems  Objective: Vital Signs: There were no vitals taken for this visit.  Specialty Comments:  No specialty comments available.  PMFS History: Patient Active Problem List   Diagnosis Date Noted   S/P BKA (below knee amputation) unilateral, left ([place]) 10/03/2020   Non-healing wound of lower extremity 10/03/2020   History of amputation of left foot through metatarsal bone ([place]) 09/11/2020   PAD (peripheral artery disease) ([place]) 08/20/2020   Status post total replacement of left hip 06/05/2020   Unilateral primary  osteoarthritis, right hip 04/17/2020   Unilateral primary osteoarthritis, left hip 04/17/2020   Amputated toe, right ([place]) 11/22/2018   Type 2 diabetes mellitus with foot ulcer, without long-term current use of insulin ([place]) 11/22/2018   Mixed hyperlipidemia 11/22/2018   Aneurysm of left popliteal artery ([place]) 11/22/2018   Status post peripheral artery angioplasty with insertion of stent 11/22/2018   Osteomyelitis of third toe of right foot (HCC)    Cellulitis of third toe of right foot    Past Medical History:  Diagnosis Date   Arthritis    Diabetes mellitus without complication ([place])    type 2 controlled with diet    Diabetic toe ulcer ([place]) 11/05/2018   History of kidney stones    passed 1 several years ago   Hyperlipidemia    Hypertension    not on medications   Peripheral vascular disease ([place])    stent in left leg due to anerysm     Family History  Problem Relation Age of Onset   COPD Mother    Cataracts Mother    Brain cancer Father    Hypertension Brother     Past Surgical History:  Procedure Laterality Date    Left Transmetatarsal Ampuation (Left Foot)  08/20/2020   ABDOMINAL AORTOGRAM W/LOWER EXTREMITY N/A 08/06/2020   Procedure: ABDOMINAL AORTOGRAM W/LOWER EXTREMITY;  Surgeon: Marty Heck, MD;  Location: [place] [facility] LAB;  Service: Cardiovascular;  Laterality: N/A;   AMPUTATION Right 11/07/2018   Procedure: RIGHT 3RD TOE AMPUTATION;  Surgeon: Newt Minion, MD;  Location: [place];  Service: Orthopedics;  Laterality: Right;   AMPUTATION Left 08/20/2020   Procedure: Left Transmetatarsal Ampuation;  Surgeon: Marty Heck, MD;  Location: [place];  Service: Vascular;  Laterality: Left;   AMPUTATION Left 10/03/2020   Procedure: LEFT BELOW KNEE AMPUTATION;  Surgeon: Marty Heck, MD;  Location: [place];  Service: Vascular;  Laterality: Left;   AMPUTATION TOE Left    big toe and one next to it   CATARACT EXTRACTION Bilateral    EYE SURGERY  Bilateral    cataracts removed   INSERTION OF ILIAC STENT Left    PERIPHERAL VASCULAR BALLOON ANGIOPLASTY Left 08/06/2020   Procedure: PERIPHERAL VASCULAR BALLOON ANGIOPLASTY;  Surgeon: Marty Heck, MD;  Location: [place] [facility] LAB;  Service: Cardiovascular;  Laterality: Left;  Peroneal artery.   TOE AMPUTATION Left 2015   TOTAL HIP ARTHROPLASTY Left 05/23/2020   Procedure: LEFT TOTAL HIP ARTHROPLASTY ANTERIOR APPROACH;  Surgeon: Mcarthur Rossetti, MD;  [facility]: [facility];  Service: Orthopedics;  Laterality: Left;   TOTAL HIP ARTHROPLASTY Right 07/31/2021   Procedure:  RIGHT TOTAL HIP ARTHROPLASTY ANTERIOR APPROACH;  Surgeon: Mcarthur Rossetti, MD;  Location: [facility];  Service: Orthopedics;  Laterality: Right;   WOUND DEBRIDEMENT Left 09/11/2020   Procedure: DEBRIDEMENT OF LEFT TRANSMETATARSAL WOUND;  Surgeon: Cherre Robins, MD;  Location: [facility] OR;  Service: Vascular;  Laterality: Left;   Social History   Occupational History   Not on file  Tobacco Use   Smoking status: Former    Packs/day: 0.50    Years: 41.00    Total pack years: 20.50    Types: Cigarettes    Quit date: 07/20/2020    Years since quitting: 2.3   Smokeless tobacco: Never  Vaping Use   Vaping Use: Never used  Substance and Sexual Activity   Alcohol use: Yes    Alcohol/week: 14.0 standard drinks of alcohol    Types: 14 Standard drinks or equivalent per week    Comment: occas.   Drug use: Not Currently    Comment: hx of marijuana 15 years ago    Sexual activity: Yes

## 2022-12-02 ENCOUNTER — Other Ambulatory Visit: Payer: Self-pay | Admitting: Internal Medicine

## 2022-12-02 DIAGNOSIS — E1151 Type 2 diabetes mellitus with diabetic peripheral angiopathy without gangrene: Secondary | ICD-10-CM | POA: Diagnosis not present

## 2022-12-02 DIAGNOSIS — E1142 Type 2 diabetes mellitus with diabetic polyneuropathy: Secondary | ICD-10-CM | POA: Diagnosis not present

## 2022-12-02 DIAGNOSIS — I129 Hypertensive chronic kidney disease with stage 1 through stage 4 chronic kidney disease, or unspecified chronic kidney disease: Secondary | ICD-10-CM | POA: Diagnosis not present

## 2022-12-02 DIAGNOSIS — N529 Male erectile dysfunction, unspecified: Secondary | ICD-10-CM | POA: Diagnosis not present

## 2022-12-02 DIAGNOSIS — Z0000 Encounter for general adult medical examination without abnormal findings: Secondary | ICD-10-CM | POA: Diagnosis not present

## 2022-12-02 DIAGNOSIS — I739 Peripheral vascular disease, unspecified: Secondary | ICD-10-CM | POA: Diagnosis not present

## 2022-12-02 DIAGNOSIS — E1149 Type 2 diabetes mellitus with other diabetic neurological complication: Secondary | ICD-10-CM | POA: Diagnosis not present

## 2022-12-02 DIAGNOSIS — I714 Abdominal aortic aneurysm, without rupture, unspecified: Secondary | ICD-10-CM

## 2022-12-02 DIAGNOSIS — Z125 Encounter for screening for malignant neoplasm of prostate: Secondary | ICD-10-CM | POA: Diagnosis not present

## 2022-12-02 DIAGNOSIS — Z1339 Encounter for screening examination for other mental health and behavioral disorders: Secondary | ICD-10-CM | POA: Diagnosis not present

## 2022-12-02 DIAGNOSIS — Z1331 Encounter for screening for depression: Secondary | ICD-10-CM | POA: Diagnosis not present

## 2023-01-06 ENCOUNTER — Ambulatory Visit
Admission: RE | Admit: 2023-01-06 | Discharge: 2023-01-06 | Disposition: A | Discharge status: Home / Self Care | Payer: BC Managed Care – PPO | Source: Ambulatory Visit | Attending: Internal Medicine | Admitting: Internal Medicine

## 2023-01-06 DIAGNOSIS — I714 Abdominal aortic aneurysm, without rupture, unspecified: Secondary | ICD-10-CM

## 2023-01-18 ENCOUNTER — Encounter: Payer: Self-pay | Admitting: Vascular Surgery

## 2023-01-18 ENCOUNTER — Ambulatory Visit (INDEPENDENT_AMBULATORY_CARE_PROVIDER_SITE_OTHER): Payer: BC Managed Care – PPO | Admitting: Vascular Surgery

## 2023-01-18 VITALS — BP 142/88 | HR 63 | Temp 98.0°F | Resp 16 | Ht 72.0 in | Wt 237.0 lb

## 2023-01-18 DIAGNOSIS — I714 Abdominal aortic aneurysm, without rupture, unspecified: Secondary | ICD-10-CM | POA: Insufficient documentation

## 2023-01-18 DIAGNOSIS — I7143 Infrarenal abdominal aortic aneurysm, without rupture: Secondary | ICD-10-CM

## 2023-01-18 NOTE — Progress Notes (Signed)
Patient name: Anthony Park MRN: [id number] DOB: [birth year] Sex: male  REASON FOR VISIT: Evaluation of AAA  HPI: Anthony Park is a 62 y.o. male with history of diabetes, hypertension, hyperlipidemia, peripheral vascular disease who presents for evaluation of abdominal aortic aneurysm.  Patient had an ultrasound of his aorta on 01/06/2023 by his PCP at [facility] showing a 5.4 cm abdominal aortic aneurysm.  This was previously 4.5 cm in 2021.  States he has known about his aneurysm for years following hip surgery.  He has a complex history and previously had a left popliteal stent at [facility] for popliteal aneurysm. We saw him with tissue loss and ultimately he had a left peroneal angioplasty on 08/06/2020 with tibial and small vessel disease in the setting of CLI. Ultimately required a TMA on 08/20/2020 and then TMA debridement and ultimately this became nonhealing and necrotic. He was eventually converted to a BKA.  He is walking with a prosthesis with his left leg now.  Past Medical History:  Diagnosis Date   Arthritis    Diabetes mellitus without complication (HCC)    type 2 controlled with diet    Diabetic toe ulcer (HCC) 11/05/2018   History of kidney stones    passed 1 several years ago   Hyperlipidemia    Hypertension    not on medications   Peripheral vascular disease (HCC)    stent in left leg due to anerysm     Past Surgical History:  Procedure Laterality Date    Left Transmetatarsal Ampuation (Left Foot)  08/20/2020   ABDOMINAL AORTOGRAM W/LOWER EXTREMITY N/A 08/06/2020   Procedure: ABDOMINAL AORTOGRAM W/LOWER EXTREMITY;  Surgeon: [name], MD;  Location: [facility];  Service: Cardiovascular;  Laterality: N/A;   AMPUTATION Right 11/07/2018   Procedure: RIGHT 3RD TOE AMPUTATION;  Surgeon: Nadara Mustard, MD;  Location: [facility] OR;  Service: Orthopedics;  Laterality: Right;   AMPUTATION Left 08/20/2020   Procedure: Left Transmetatarsal  Ampuation;  Surgeon: [name], MD;  Location: [facility] OR;  Service: Vascular;  Laterality: Left;   AMPUTATION Left 10/03/2020   Procedure: LEFT BELOW KNEE AMPUTATION;  Surgeon: [name], MD;  Location: [facility] OR;  Service: Vascular;  Laterality: Left;   AMPUTATION TOE Left    big toe and one next to it   CATARACT EXTRACTION Bilateral    EYE SURGERY Bilateral    cataracts removed   INSERTION OF ILIAC STENT Left    PERIPHERAL VASCULAR BALLOON ANGIOPLASTY Left 08/06/2020   Procedure: PERIPHERAL VASCULAR BALLOON ANGIOPLASTY;  Surgeon: [name], MD;  Location: [facility];  Service: Cardiovascular;  Laterality: Left;  Peroneal artery.   TOE AMPUTATION Left 2015   TOTAL HIP ARTHROPLASTY Left 05/23/2020   Procedure: LEFT TOTAL HIP ARTHROPLASTY ANTERIOR APPROACH;  Surgeon: Kathryne Hitch, MD;  Location: [facility] ORS;  Service: Orthopedics;  Laterality: Left;   TOTAL HIP ARTHROPLASTY Right 07/31/2021   Procedure: RIGHT TOTAL HIP ARTHROPLASTY ANTERIOR APPROACH;  Surgeon: Kathryne Hitch, MD;  Location: [facility] ORS;  Service: Orthopedics;  Laterality: Right;   WOUND DEBRIDEMENT Left 09/11/2020   Procedure: DEBRIDEMENT OF LEFT TRANSMETATARSAL WOUND;  Surgeon: Leonie Douglas, MD;  Location: [facility] OR;  Service: Vascular;  Laterality: Left;    Family History  Problem Relation Age of Onset   COPD Mother    Cataracts Mother    Brain cancer Father    Hypertension Brother     SOCIAL HISTORY: Social  History   Tobacco Use   Smoking status: Former    Packs/day: 0.50    Years: 41.00    Additional pack years: 0.00    Total pack years: 20.50    Types: Cigarettes    Quit date: 07/20/2020    Years since quitting: 2.4   Smokeless tobacco: Never  Substance Use Topics   Alcohol use: Yes    Alcohol/week: 14.0 standard drinks of alcohol    Types: 14 Standard drinks or equivalent per week    Comment: occas.    Allergies  Allergen Reactions   Meloxicam Nausea  Only    Upset stomach     Current Outpatient Medications  Medication Sig Dispense Refill   acetaminophen (TYLENOL) 500 MG tablet Take 1,000 mg by mouth every 6 (six) hours as needed for moderate pain.     ascorbic acid (VITAMIN C) 500 MG tablet Take 500 mg by mouth daily.     aspirin EC 81 MG tablet Take 1 tablet (81 mg total) by mouth 2 (two) times daily after a meal. Swallow whole. 30 tablet 0   clopidogrel (PLAVIX) 75 MG tablet 1 tablet Orally Once a day for 30 days     EPIPEN 2-PAK 0.3 MG/0.3ML SOAJ injection as directed Injection as directed for anaphylaxis for 1 days     FARXIGA 5 MG TABS tablet 1 tablet Orally Once a day     folic acid (FOLVITE) 1 MG tablet Take 1 tablet (1 mg total) by mouth daily. 30 tablet 1   gabapentin (NEURONTIN) 300 MG capsule Take 2 capsules (600 mg total) by mouth 2 (two) times daily. (Patient taking differently: Take 300 mg by mouth 2 (two) times daily as needed (pain).) 120 capsule 2   hydrochlorothiazide (HYDRODIURIL) 25 MG tablet 1 tablet in the morning Orally Once a day     lisinopril (ZESTRIL) 40 MG tablet Take 1 tablet (40 mg total) by mouth daily. 30 tablet 0   methocarbamol (ROBAXIN) 500 MG tablet Take 1 tablet (500 mg total) by mouth every 6 (six) hours as needed for muscle spasms. 30 tablet 0   Multiple Vitamin (MULTIVITAMIN WITH MINERALS) TABS tablet Take 1 tablet by mouth daily.     mupirocin ointment (BACTROBAN) 2 % 1 application Externally Twice a day     olmesartan (BENICAR) 40 MG tablet 1 tablet Orally Once a day     POLY-IRON 150 FORTE 150-25-1 MG-MCG-MG CAPS Take 150 mg by mouth daily.     rosuvastatin (CRESTOR) 10 MG tablet Take 10 mg by mouth at bedtime.     tiZANidine (ZANAFLEX) 2 MG tablet Take 2 mg by mouth 3 (three) times daily as needed.     traMADol (ULTRAM) 50 MG tablet Take 1 tablet (50 mg total) by mouth every 6 (six) hours as needed. 30 tablet 0   metFORMIN (GLUCOPHAGE) 500 MG tablet TAKE 1 TABLET BY MOUTH 2 TIMES DAILY WITH A  MEAL. (Patient not taking: Reported on 01/18/2023) 60 tablet 0   No current facility-administered medications for this visit.    REVIEW OF SYSTEMS:  [X]  denotes positive finding, [ ]  denotes negative finding Cardiac  Comments:  Chest pain or chest pressure:    Shortness of breath upon exertion:    Short of breath when lying flat:    Irregular heart rhythm:        Vascular    Pain in calf, thigh, or hip brought on by ambulation:    Pain in feet at night that  wakes you up from your sleep:     Blood clot in your veins:    Leg swelling:         Pulmonary    Oxygen at home:    Productive cough:     Wheezing:         Neurologic    Sudden weakness in arms or legs:     Sudden numbness in arms or legs:     Sudden onset of difficulty speaking or slurred speech:    Temporary loss of vision in one eye:     Problems with dizziness:         Gastrointestinal    Blood in stool:     Vomited blood:         Genitourinary    Burning when urinating:     Blood in urine:        Psychiatric    Major depression:         Hematologic    Bleeding problems:    Problems with blood clotting too easily:        Skin    Rashes or ulcers:        Constitutional    Fever or chills:      PHYSICAL EXAM: Vitals:   01/18/23 1502  BP: (!) 142/88  Pulse: 63  Resp: 16  Temp: 98 F (36.7 C)  TempSrc: Temporal  SpO2: 94%  Weight: 237 lb (107.5 kg)  Height: 6' (1.829 m)    GENERAL: The patient is a well-nourished male, in no acute distress. The vital signs are documented above. VASCULAR:  Bilateral femoral pulses palpable Right pedal pulses nonpalpable Left BKA healed with prosthesis ABDOMEN: No Pain.  No rebound or guarding.  DATA:   Ultrasound of his aorta on 01/06/2023 showing a 5.4 cm abdominal aortic aneurysm.  Assessment/Plan:  62 y.o. male with history of diabetes, hypertension, hyperlipidemia, peripheral vascular disease s/p left BKA who presents for evaluation of abdominal  aortic aneurysm.  Patient had an ultrasound of his aorta on 01/06/2023 showing a 5.4 cm abdominal aortic aneurysm.  This has increased from 4.5 cm in 2021.  I discussed typical indication for repair of abdominal aortic aneurysm in men is greater than 5.5 cm.  I have recommended CTA abdomen pelvis to further evaluate his anatomy and to get a more accurate measurement of the aneurysm diameter.  Discussed stent graft is the standard unless his anatomy is not amenable to stent graft repair and then we will evaluate for open repair.  I will order the CT for now and have him follow-up with me afterwards.  Discussed if his kidney function is elevated we can certainly get a noncontrast CT abdomen pelvis.  [name], MD [facility] [place] Office: [phone]

## 2023-01-19 DIAGNOSIS — N1831 Chronic kidney disease, stage 3a: Secondary | ICD-10-CM | POA: Insufficient documentation

## 2023-01-19 DIAGNOSIS — E291 Testicular hypofunction: Secondary | ICD-10-CM | POA: Insufficient documentation

## 2023-01-19 DIAGNOSIS — I714 Abdominal aortic aneurysm, without rupture, unspecified: Secondary | ICD-10-CM | POA: Insufficient documentation

## 2023-01-19 DIAGNOSIS — N529 Male erectile dysfunction, unspecified: Secondary | ICD-10-CM | POA: Insufficient documentation

## 2023-01-21 ENCOUNTER — Other Ambulatory Visit: Payer: Self-pay

## 2023-01-21 DIAGNOSIS — I7143 Infrarenal abdominal aortic aneurysm, without rupture: Secondary | ICD-10-CM

## 2023-01-31 ENCOUNTER — Ambulatory Visit
Admission: RE | Admit: 2023-01-31 | Discharge: 2023-01-31 | Disposition: A | Discharge status: Home / Self Care | Payer: BC Managed Care – PPO | Source: Ambulatory Visit | Attending: Vascular Surgery | Admitting: Vascular Surgery

## 2023-01-31 DIAGNOSIS — I700 Atherosclerosis of aorta: Secondary | ICD-10-CM | POA: Diagnosis not present

## 2023-01-31 DIAGNOSIS — I7143 Infrarenal abdominal aortic aneurysm, without rupture: Secondary | ICD-10-CM

## 2023-01-31 DIAGNOSIS — I719 Aortic aneurysm of unspecified site, without rupture: Secondary | ICD-10-CM | POA: Diagnosis not present

## 2023-01-31 DIAGNOSIS — I723 Aneurysm of iliac artery: Secondary | ICD-10-CM | POA: Diagnosis not present

## 2023-01-31 DIAGNOSIS — K760 Fatty (change of) liver, not elsewhere classified: Secondary | ICD-10-CM | POA: Diagnosis not present

## 2023-01-31 MED ORDER — IOPAMIDOL (ISOVUE-370) INJECTION 76%
75.0000 mL | Freq: Once | INTRAVENOUS | Status: AC | PRN
  Administered 2023-01-31: 75 mL via INTRAVENOUS

## 2023-02-10 ENCOUNTER — Other Ambulatory Visit: Payer: Self-pay | Admitting: Physician Assistant

## 2023-02-15 ENCOUNTER — Encounter: Payer: Self-pay | Admitting: Vascular Surgery

## 2023-02-15 ENCOUNTER — Ambulatory Visit (INDEPENDENT_AMBULATORY_CARE_PROVIDER_SITE_OTHER): Payer: BC Managed Care – PPO | Admitting: Vascular Surgery

## 2023-02-15 VITALS — BP 133/82 | HR 80 | Temp 97.8°F | Resp 16 | Ht 72.0 in | Wt 228.0 lb

## 2023-02-15 DIAGNOSIS — I7143 Infrarenal abdominal aortic aneurysm, without rupture: Secondary | ICD-10-CM | POA: Diagnosis not present

## 2023-02-15 NOTE — Progress Notes (Signed)
Patient name: Anthony Park MRN: [id number] DOB: Oct 31, 1960 Sex: male  REASON FOR VISIT: F/U after CTA for AAA  HPI: [name] is a 62 y.o. male with history of diabetes, hypertension, hyperlipidemia, peripheral vascular disease who presents for follow-up after CTA for evaluation of abdominal aortic aneurysm.  Patient had an ultrasound of his aorta on 01/06/2023 by his PCP at [facility] showing a 5.4 cm abdominal aortic aneurysm.  This was previously 4.5 cm in 2021.  States he has known about his aneurysm for years following hip surgery.  We sent him for CTA.  He has a complex history and previously had a left popliteal stent at [facility] for popliteal aneurysm. We saw him with tissue loss and ultimately he had a left peroneal angioplasty on 08/06/2020 with tibial and small vessel disease in the setting of CLI. Ultimately required a TMA on 08/20/2020 and then TMA debridement and ultimately this became nonhealing and necrotic. He was eventually converted to a BKA.  He is walking with a prosthesis with his left leg now.  Past Medical History:  Diagnosis Date   Arthritis    Diabetes mellitus without complication (HCC)    type 2 controlled with diet    Diabetic toe ulcer (HCC) 11/05/2018   History of kidney stones    passed 1 several years ago   Hyperlipidemia    Hypertension    not on medications   Peripheral vascular disease (HCC)    stent in left leg due to anerysm     Past Surgical History:  Procedure Laterality Date    Left Transmetatarsal Ampuation (Left Foot)  08/20/2020   ABDOMINAL AORTOGRAM W/LOWER EXTREMITY N/A 08/06/2020   Procedure: ABDOMINAL AORTOGRAM W/LOWER EXTREMITY;  Surgeon: [name], MD;  Location: [facility];  Service: Cardiovascular;  Laterality: N/A;   AMPUTATION Right 11/07/2018   Procedure: RIGHT 3RD TOE AMPUTATION;  Surgeon: Nadara Mustard, MD;  Location: [facility] OR;  Service: Orthopedics;  Laterality: Right;   AMPUTATION Left  08/20/2020   Procedure: Left Transmetatarsal Ampuation;  Surgeon: [name], MD;  Location: [facility] OR;  Service: Vascular;  Laterality: Left;   AMPUTATION Left 10/03/2020   Procedure: LEFT BELOW KNEE AMPUTATION;  Surgeon: [name], MD;  Location: [facility] OR;  Service: Vascular;  Laterality: Left;   AMPUTATION TOE Left    big toe and one next to it   CATARACT EXTRACTION Bilateral    EYE SURGERY Bilateral    cataracts removed   INSERTION OF ILIAC STENT Left    PERIPHERAL VASCULAR BALLOON ANGIOPLASTY Left 08/06/2020   Procedure: PERIPHERAL VASCULAR BALLOON ANGIOPLASTY;  Surgeon: [name], MD;  Location: [facility];  Service: Cardiovascular;  Laterality: Left;  Peroneal artery.   TOE AMPUTATION Left 2015   TOTAL HIP ARTHROPLASTY Left 05/23/2020   Procedure: LEFT TOTAL HIP ARTHROPLASTY ANTERIOR APPROACH;  Surgeon: Kathryne Hitch, MD;  Location: [facility]S;  Service: Orthopedics;  Laterality: Left;   TOTAL HIP ARTHROPLASTY Right 07/31/2021   Procedure: RIGHT TOTAL HIP ARTHROPLASTY ANTERIOR APPROACH;  Surgeon: Kathryne Hitch, MD;  Location: [facility] ORS;  Service: Orthopedics;  Laterality: Right;   WOUND DEBRIDEMENT Left 09/11/2020   Procedure: DEBRIDEMENT OF LEFT TRANSMETATARSAL WOUND;  Surgeon: Leonie Douglas, MD;  Location: [facility] OR;  Service: Vascular;  Laterality: Left;    Family History  Problem Relation Age of Onset   COPD Mother    Cataracts Mother    Brain cancer Father  Hypertension Brother     SOCIAL HISTORY: Social History   Tobacco Use   Smoking status: Former    Packs/day: 0.50    Years: 41.00    Additional pack years: 0.00    Total pack years: 20.50    Types: Cigarettes    Quit date: 07/20/2020    Years since quitting: 2.5   Smokeless tobacco: Never  Substance Use Topics   Alcohol use: Yes    Alcohol/week: 14.0 standard drinks of alcohol    Types: 14 Standard drinks or equivalent per week    Comment: occas.     Allergies  Allergen Reactions   Meloxicam Nausea Only    Upset stomach     Current Outpatient Medications  Medication Sig Dispense Refill   acetaminophen (TYLENOL) 500 MG tablet Take 1,000 mg by mouth every 6 (six) hours as needed for moderate pain.     ascorbic acid (VITAMIN C) 500 MG tablet Take 500 mg by mouth daily.     aspirin EC 81 MG tablet Take 1 tablet (81 mg total) by mouth 2 (two) times daily after a meal. Swallow whole. 30 tablet 0   clopidogrel (PLAVIX) 75 MG tablet 1 tablet Orally Once a day for 30 days     EPIPEN 2-PAK 0.3 MG/0.3ML SOAJ injection as directed Injection as directed for anaphylaxis for 1 days     FARXIGA 5 MG TABS tablet 1 tablet Orally Once a day     folic acid (FOLVITE) 1 MG tablet Take 1 tablet (1 mg total) by mouth daily. 30 tablet 1   gabapentin (NEURONTIN) 300 MG capsule Take 2 capsules (600 mg total) by mouth 2 (two) times daily. (Patient taking differently: Take 300 mg by mouth 2 (two) times daily as needed (pain).) 120 capsule 2   hydrochlorothiazide (HYDRODIURIL) 25 MG tablet 1 tablet in the morning Orally Once a day     lisinopril (ZESTRIL) 40 MG tablet Take 1 tablet (40 mg total) by mouth daily. 30 tablet 0   metFORMIN (GLUCOPHAGE) 500 MG tablet TAKE 1 TABLET BY MOUTH 2 TIMES DAILY WITH A MEAL. (Patient not taking: Reported on 01/18/2023) 60 tablet 0   methocarbamol (ROBAXIN) 500 MG tablet Take 1 tablet (500 mg total) by mouth every 6 (six) hours as needed for muscle spasms. 30 tablet 0   Multiple Vitamin (MULTIVITAMIN WITH MINERALS) TABS tablet Take 1 tablet by mouth daily.     mupirocin ointment (BACTROBAN) 2 % 1 application Externally Twice a day     olmesartan (BENICAR) 40 MG tablet 1 tablet Orally Once a day     POLY-IRON 150 FORTE 150-25-1 MG-MCG-MG CAPS Take 150 mg by mouth daily.     rosuvastatin (CRESTOR) 10 MG tablet Take 10 mg by mouth at bedtime.     tiZANidine (ZANAFLEX) 2 MG tablet Take 2 mg by mouth 3 (three) times daily as needed.      traMADol (ULTRAM) 50 MG tablet Take 1 tablet (50 mg total) by mouth every 6 (six) hours as needed. 30 tablet 0   No current facility-administered medications for this visit.    REVIEW OF SYSTEMS:  [X]  denotes positive finding, [ ]  denotes negative finding Cardiac  Comments:  Chest pain or chest pressure:    Shortness of breath upon exertion:    Short of breath when lying flat:    Irregular heart rhythm:        Vascular    Pain in calf, thigh, or hip brought on by ambulation:  Pain in feet at night that wakes you up from your sleep:     Blood clot in your veins:    Leg swelling:         Pulmonary    Oxygen at home:    Productive cough:     Wheezing:         Neurologic    Sudden weakness in arms or legs:     Sudden numbness in arms or legs:     Sudden onset of difficulty speaking or slurred speech:    Temporary loss of vision in one eye:     Problems with dizziness:         Gastrointestinal    Blood in stool:     Vomited blood:         Genitourinary    Burning when urinating:     Blood in urine:        Psychiatric    Major depression:         Hematologic    Bleeding problems:    Problems with blood clotting too easily:        Skin    Rashes or ulcers:        Constitutional    Fever or chills:      PHYSICAL EXAM: There were no vitals filed for this visit.   GENERAL: The patient is a well-nourished male, in no acute distress. The vital signs are documented above. VASCULAR:  Bilateral femoral pulses palpable Right pedal pulses nonpalpable Left BKA healed with prosthesis ABDOMEN: No Pain.  No rebound or guarding.  DATA:   CTA 01/31/23:  IMPRESSION: 1. 4.3 cm infrarenal abdominal aortic aneurysm. Recommend follow-up every 12 months and vascular consultation. This recommendation follows ACR consensus guidelines: White Paper of the ACR Incidental Findings Committee II on Vascular Findings. J Am Coll Radiol 2013; 10:789-794. 2. 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 3. Fatty infiltration of the liver. 4. Coronary and aortic Atherosclerosis (ICD10-I70.0).     Electronically Signed   By: Corlis Leak M.D.   On: 01/31/2023 16:58  Ultrasound of his aorta on 01/06/2023 showing a 5.4 cm abdominal aortic aneurysm.  Assessment/Plan:  62 y.o. male with history of diabetes, hypertension, hyperlipidemia, peripheral vascular disease s/p left BKA who presents for follow-up after CTA for further evaluation of abdominal aortic aneurysm.  Patient had an ultrasound of his aorta on 01/06/2023 showing a 5.4 cm abdominal aortic aneurysm.  This had increased from 4.5 cm in 2021.  I recommended a CTA for further evaluation of his aneurysm as well as his anatomy.  Fortunately the CTA showed a 4.3 cm abdominal aortic aneurysm that is much smaller than the ultrasound suggested.  I discussed he does not need surgical intervention from our standpoint at this time.  Again discussed that we repair abdominal aortic aneurysms at greater than 5.5 cm in men.  I will see him in 6 months with AAA duplex here in the office.  [name], MD [facility] [place] Office: [phone]

## 2023-02-17 ENCOUNTER — Other Ambulatory Visit: Payer: Self-pay

## 2023-02-17 DIAGNOSIS — I7143 Infrarenal abdominal aortic aneurysm, without rupture: Secondary | ICD-10-CM

## 2023-03-11 DIAGNOSIS — Z89512 Acquired absence of left leg below knee: Secondary | ICD-10-CM | POA: Diagnosis not present

## 2023-03-22 ENCOUNTER — Ambulatory Visit (INDEPENDENT_AMBULATORY_CARE_PROVIDER_SITE_OTHER): Payer: BC Managed Care – PPO | Admitting: Family

## 2023-03-22 DIAGNOSIS — Z89512 Acquired absence of left leg below knee: Secondary | ICD-10-CM | POA: Diagnosis not present

## 2023-03-22 DIAGNOSIS — S88112D Complete traumatic amputation at level between knee and ankle, left lower leg, subsequent encounter: Secondary | ICD-10-CM

## 2023-03-22 MED ORDER — PREGABALIN 50 MG PO CAPS: 50.0000 mg | ORAL_CAPSULE | Freq: Three times a day (TID) | ORAL | 1 refills | Status: DC

## 2023-03-22 NOTE — Progress Notes (Unsigned)
Office Visit Note   Patient: Anthony Park           Date of Birth: Jan 16, 1961           MRN: [id number] Visit Date: 03/22/2023              Requested by: Melida Quitter, MD [address] [place],  [place] [address] PCP: Melida Quitter, MD  Chief Complaint  Patient presents with   Left Leg - Pain    Hx left BKA      HPI: The patient is a 62 year old gentleman who is status post left below-knee amputation has been set up with a new socket earlier this year with Brett Canales at [facility].  He presents today complaining of difficulty with pain both from end bearing and subsiding into his socket tightness from the socket has about 3 different painful pressure points but also having difficulty with neuropathic pain which is 10 out of 10 often so painful that he must sit down remove his prosthesis.  Has difficulty sleeping due to pain.  He is currently taking gabapentin 600 mg twice daily with modest relief  Has previously had dry needling with PT Arlys John at our location with some relief wonders if this would be useful to try again  Also uses Robaxin with some relief  Assessment & Plan: Visit Diagnoses:  1. Below-knee amputation of left lower extremity, subsequent encounter [facility])     Plan: Has an appointment later this week for further modification of his socket but overall pleased with his current socket and prosthesis.  Will send an order to physical therapy  Will also trial Lyrica stop gabapentin and start Lyrica  Follow-Up Instructions: No follow-ups on file.   Ortho Exam  Patient is alert, oriented, no adenopathy, well-dressed, normal affect, normal respiratory effort. On examination left residual limb this is well consolidated well-healed there is no erythema no ulceration no impending ulceration  Imaging: No results found. No images are attached to the encounter.  Labs: Lab Results  Component Value Date   HGBA1C 6.5 (H) 07/24/2021   HGBA1C 6.2 (H) 10/03/2020   HGBA1C 6.8 (H)  05/20/2020   CRP 3.8 (H) 10/03/2020   CRP 53.7 (H) 09/23/2020   LABURIC 6.9 07/29/2020   REPTSTATUS 09/16/2020 FINAL 09/11/2020   GRAMSTAIN  09/11/2020    RARE WBC PRESENT, PREDOMINANTLY MONONUCLEAR MODERATE GRAM NEGATIVE RODS FEW GRAM VARIABLE ROD    CULT  09/11/2020    MODERATE PSEUDOMONAS AERUGINOSA RARE ESCHERICHIA COLI NO ANAEROBES ISOLATED Performed at [facility], [address], [place], [place] [address]    LABORGA PSEUDOMONAS AERUGINOSA 09/11/2020   LABORGA ESCHERICHIA COLI 09/11/2020     Lab Results  Component Value Date   ALBUMIN 3.4 (L) 10/03/2020   ALBUMIN 3.6 09/11/2020   ALBUMIN 3.5 11/06/2018    Lab Results  Component Value Date   MG 1.9 08/24/2020   MG 1.6 (L) 08/23/2020   No results found for: "VD25OH"  No results found for: "PREALBUMIN"    Latest Ref Rng & Units 07/24/2021    8:38 AM 10/06/2020    3:42 AM 10/05/2020    6:14 AM  CBC EXTENDED  WBC 4.0 - 10.5 K/uL 8.4  7.4  8.0   RBC 4.22 - 5.81 MIL/uL 4.89  2.83  2.80   Hemoglobin 13.0 - 17.0 g/dL 18.8  8.1  8.1   HCT 41.6 - 52.0 % 40.4  26.1  25.4   Platelets 150 - 400 K/uL  240  254  262      There is no height or weight on file to calculate BMI.  Orders:  Orders Placed This Encounter  Procedures   Ambulatory referral to Physical Therapy   No orders of the defined types were placed in this encounter.    Procedures: No procedures performed  Clinical Data: No additional findings.  ROS:  All other systems negative, except as noted in the HPI. Review of Systems  Objective: Vital Signs: There were no vitals taken for this visit.  Specialty Comments:  No specialty comments available.  PMFS History: Patient Active Problem List   Diagnosis Date Noted   Hypogonadism, male 01/19/2023   Erectile dysfunction 01/19/2023   Aneurysm of abdominal vessel (HCC) 01/19/2023   Chronic kidney disease, stage 3a (HCC) 01/19/2023   AAA (abdominal aortic aneurysm) without rupture (HCC)  01/18/2023   S/P BKA (below knee amputation) unilateral, left (HCC) 10/03/2020   Non-healing wound of lower extremity 10/03/2020   History of amputation of left foot through metatarsal bone (HCC) 09/11/2020   PAD (peripheral artery disease) (HCC) 08/20/2020   Status post total replacement of left hip 06/05/2020   Unilateral primary osteoarthritis, right hip 04/17/2020   Unilateral primary osteoarthritis, left hip 04/17/2020   Amputated toe, right (HCC) 11/22/2018   Type 2 diabetes mellitus with foot ulcer, without long-term current use of insulin (HCC) 11/22/2018   Mixed hyperlipidemia 11/22/2018   Aneurysm of left popliteal artery (HCC) 11/22/2018   Status post peripheral artery angioplasty with insertion of stent 11/22/2018   Osteomyelitis of third toe of right foot (HCC)    Cellulitis of third toe of right foot    Past Medical History:  Diagnosis Date   Arthritis    Diabetes mellitus without complication (HCC)    type 2 controlled with diet    Diabetic toe ulcer (HCC) 11/05/2018   History of kidney stones    passed 1 several years ago   Hyperlipidemia    Hypertension    not on medications   Peripheral vascular disease (HCC)    stent in left leg due to anerysm     Family History  Problem Relation Age of Onset   COPD Mother    Cataracts Mother    Brain cancer Father    Hypertension Brother     Past Surgical History:  Procedure Laterality Date    Left Transmetatarsal Ampuation (Left Foot)  08/20/2020   ABDOMINAL AORTOGRAM W/LOWER EXTREMITY N/A 08/06/2020   Procedure: ABDOMINAL AORTOGRAM W/LOWER EXTREMITY;  Surgeon: [name], MD;  Location: [facility];  Service: Cardiovascular;  Laterality: N/A;   AMPUTATION Right 11/07/2018   Procedure: RIGHT 3RD TOE AMPUTATION;  Surgeon: Nadara Mustard, MD;  Location: [facility] OR;  Service: Orthopedics;  Laterality: Right;   AMPUTATION Left 08/20/2020   Procedure: Left Transmetatarsal Ampuation;  Surgeon: [name], MD;  Location: [facility] OR;  Service: Vascular;  Laterality: Left;   AMPUTATION Left 10/03/2020   Procedure: LEFT BELOW KNEE AMPUTATION;  Surgeon: [name], MD;  Location: [facility] OR;  Service: Vascular;  Laterality: Left;   AMPUTATION TOE Left    big toe and one next to it   CATARACT EXTRACTION Bilateral    EYE SURGERY Bilateral    cataracts removed   INSERTION OF ILIAC STENT Left    PERIPHERAL VASCULAR BALLOON ANGIOPLASTY Left 08/06/2020   Procedure: PERIPHERAL VASCULAR BALLOON ANGIOPLASTY;  Surgeon: [name], MD;  Location: [facility];  Service: Cardiovascular;  Laterality: Left;  Peroneal artery.   TOE AMPUTATION Left 2015   TOTAL HIP ARTHROPLASTY Left 05/23/2020   Procedure: LEFT TOTAL HIP ARTHROPLASTY ANTERIOR APPROACH;  Surgeon: Kathryne Hitch, MD;  Location: [facility];  Service: Orthopedics;  Laterality: Left;   TOTAL HIP ARTHROPLASTY Right 07/31/2021   Procedure: RIGHT TOTAL HIP ARTHROPLASTY ANTERIOR APPROACH;  Surgeon: Kathryne Hitch, MD;  Location: [facility];  Service: Orthopedics;  Laterality: Right;   WOUND DEBRIDEMENT Left 09/11/2020   Procedure: DEBRIDEMENT OF LEFT TRANSMETATARSAL WOUND;  Surgeon: [place]ie Douglas, MD;  Location: [facility] OR;  Service: Vascular;  Laterality: Left;   Social History   Occupational History   Not on file  Tobacco Use   Smoking status: Former    Packs/day: 0.50    Years: 41.00    Additional pack years: 0.00    Total pack years: 20.50    Types: Cigarettes    Quit date: 07/20/2020    Years since quitting: 2.6   Smokeless tobacco: Never  Vaping Use   Vaping Use: Never used  Substance and Sexual Activity   Alcohol use: Yes    Alcohol/week: 14.0 standard drinks of alcohol    Types: 14 Standard drinks or equivalent per week    Comment: occas.   Drug use: Not Currently    Comment: hx of marijuana 15 years ago    Sexual activity: Yes

## 2023-03-23 ENCOUNTER — Encounter: Payer: Self-pay | Admitting: Family

## 2023-03-30 ENCOUNTER — Encounter: Payer: Self-pay | Admitting: Physical Therapy

## 2023-03-30 ENCOUNTER — Other Ambulatory Visit: Payer: Self-pay

## 2023-03-30 ENCOUNTER — Ambulatory Visit (INDEPENDENT_AMBULATORY_CARE_PROVIDER_SITE_OTHER): Payer: BC Managed Care – PPO | Admitting: Physical Therapy

## 2023-03-30 DIAGNOSIS — R2689 Other abnormalities of gait and mobility: Secondary | ICD-10-CM | POA: Diagnosis not present

## 2023-03-30 DIAGNOSIS — M79662 Pain in left lower leg: Secondary | ICD-10-CM | POA: Diagnosis not present

## 2023-03-30 DIAGNOSIS — M6281 Muscle weakness (generalized): Secondary | ICD-10-CM

## 2023-03-30 NOTE — Therapy (Signed)
OUTPATIENT PHYSICAL THERAPY LOWER EXTREMITY EVALUATION   Patient Name: Anthony Park MRN: [id number] DOB:12/23/1960, 63 y.o., male Today's Date: 03/30/2023  END OF SESSION:  PT End of Session - 03/30/23 1406     Visit Number 1    Number of Visits 8    Date for PT Re-Evaluation 05/11/23    PT Start Time 1300    PT Stop Time 1350    PT Time Calculation (min) 50 min    Activity Tolerance Patient tolerated treatment well    Behavior During Therapy [facility] for tasks assessed/performed             Past Medical History:  Diagnosis Date   Arthritis    Diabetes mellitus without complication (HCC)    type 2 controlled with diet    Diabetic toe ulcer (HCC) 11/05/2018   History of kidney stones    passed 1 several years ago   Hyperlipidemia    Hypertension    not on medications   Peripheral vascular disease (HCC)    stent in left leg due to anerysm    Past Surgical History:  Procedure Laterality Date    Left Transmetatarsal Ampuation (Left Foot)  08/20/2020   ABDOMINAL AORTOGRAM W/LOWER EXTREMITY N/A 08/06/2020   Procedure: ABDOMINAL AORTOGRAM W/LOWER EXTREMITY;  Surgeon: [name], MD;  Location: [facility];  Service: Cardiovascular;  Laterality: N/A;   AMPUTATION Right 11/07/2018   Procedure: RIGHT 3RD TOE AMPUTATION;  Surgeon: Nadara Mustard, MD;  Location: [facility] OR;  Service: Orthopedics;  Laterality: Right;   AMPUTATION Left 08/20/2020   Procedure: Left Transmetatarsal Ampuation;  Surgeon: [name], MD;  Location: [facility] OR;  Service: Vascular;  Laterality: Left;   AMPUTATION Left 10/03/2020   Procedure: LEFT BELOW KNEE AMPUTATION;  Surgeon: [name], MD;  Location: [facility] OR;  Service: Vascular;  Laterality: Left;   AMPUTATION TOE Left    big toe and one next to it   CATARACT EXTRACTION Bilateral    EYE SURGERY Bilateral    cataracts removed   INSERTION OF ILIAC STENT Left    PERIPHERAL VASCULAR BALLOON ANGIOPLASTY Left 08/06/2020    Procedure: PERIPHERAL VASCULAR BALLOON ANGIOPLASTY;  Surgeon: [name], MD;  Location: [facility];  Service: Cardiovascular;  Laterality: Left;  Peroneal artery.   TOE AMPUTATION Left 2015   TOTAL HIP ARTHROPLASTY Left 05/23/2020   Procedure: LEFT TOTAL HIP ARTHROPLASTY ANTERIOR APPROACH;  Surgeon: Kathryne Hitch, MD;  Location: [facility];  Service: Orthopedics;  Laterality: Left;   TOTAL HIP ARTHROPLASTY Right 07/31/2021   Procedure: RIGHT TOTAL HIP ARTHROPLASTY ANTERIOR APPROACH;  Surgeon: Kathryne Hitch, MD;  Location: [facility];  Service: Orthopedics;  Laterality: Right;   WOUND DEBRIDEMENT Left 09/11/2020   Procedure: DEBRIDEMENT OF LEFT TRANSMETATARSAL WOUND;  Surgeon: [place]ie Douglas, MD;  Location: [facility] OR;  Service: Vascular;  Laterality: Left;   Patient Active Problem List   Diagnosis Date Noted   Hypogonadism, male 01/19/2023   Erectile dysfunction 01/19/2023   Aneurysm of abdominal vessel (HCC) 01/19/2023   Chronic kidney disease, stage 3a (HCC) 01/19/2023   AAA (abdominal aortic aneurysm) without rupture (HCC) 01/18/2023   S/P BKA (below knee amputation) unilateral, left (HCC) 10/03/2020   Non-healing wound of lower extremity 10/03/2020   History of amputation of left foot through metatarsal bone (HCC) 09/11/2020   PAD (peripheral artery disease) (HCC) 08/20/2020   Status post total replacement of left hip 06/05/2020   Unilateral primary  osteoarthritis, right hip 04/17/2020   Unilateral primary osteoarthritis, left hip 04/17/2020   Amputated toe, right (HCC) 11/22/2018   Type 2 diabetes mellitus with foot ulcer, without long-term current use of insulin (HCC) 11/22/2018   Mixed hyperlipidemia 11/22/2018   Aneurysm of left popliteal artery (HCC) 11/22/2018   Status post peripheral artery angioplasty with insertion of stent 11/22/2018   Osteomyelitis of third toe of right foot (HCC)    Cellulitis of third toe of right foot     PCP: Melida Quitter, MD   REFERRING PROVIDER:   Adonis Huguenin, NP    REFERRING DIAG: [phone] (ICD-10-CM) - Below-knee amputation of left lower extremity, subsequent encounter (HCC)   THERAPY DIAG:  Pain in left lower leg  Muscle weakness (generalized)  Other abnormalities of gait and mobility  Rationale for Evaluation and Treatment: Rehabilitation  ONSET DATE: 6 week onset of left residual limb pain  SUBJECTIVE:   SUBJECTIVE STATEMENT: New prosthesis socket and foot 6 months to a year ago. He is having some residual limb pain on left side that is both muscular pain but also phantom sensations and phantom pain that has been going on about 6 weeks or so. His leg does still shrink and swell throughout the day. He is interested in having DN treatment. He denies any issues or difficulty with prosthesis and prosthetic gait.   PERTINENT HISTORY: Rt THA (anterior approach) 07/31/21. Left TTTA, left THA (05/23/20), arthritis, DM2, HTN, HLD, PVD PAIN:  Are you having pain? Yes: NPRS scale: depends /10 Pain location: left distal and lateral residual limb at tibialis anterior and calf Pain description: fatigue, cramps, also reports phantom  Aggravating factors: wearing prosthesis too long sometimes, sometimes just has it at rest Relieving factors: taking off prosthesis  PRECAUTIONS: None  RED FLAGS: None   WEIGHT BEARING RESTRICTIONS: No  FALLS:  Has patient fallen in last 6 months? No   PATIENT GOALS: reduce pain in his leg  NEXT MD VISIT: nothing scheduled at eval  OBJECTIVE:   DIAGNOSTIC FINDINGS:   PATIENT SURVEYS:  FOTO not done, not a good category to place him in so will defer this  COGNITION: Overall cognitive status: Within functional limits for tasks assessed     SENSATION: Light touch: WFL   EDEMA:  Mild edema noted in distal legs bilat  PALPATION: Tender to palpation in left tibialis anterior and gastroc  LOWER EXTREMITY ROM:  Active ROM Right eval Left eval   Hip flexion    Hip extension    Hip abduction    Hip adduction    Hip internal rotation    Hip external rotation    Knee flexion    Knee extension    Ankle dorsiflexion    Ankle plantarflexion    Ankle inversion    Ankle eversion     (Blank rows = not tested)  LOWER EXTREMITY MMT:  MMT Right eval Left eval  Hip flexion  WFL  Hip extension    Hip abduction    Hip adduction    Hip internal rotation    Hip external rotation    Knee flexion  WFL  Knee extension  WNL  Ankle dorsiflexion    Ankle plantarflexion    Ankle inversion    Ankle eversion     (Blank rows = not tested)    FUNCTIONAL TESTS:    GAIT: Comments: WFL using prosthesis on left, he is independent community Ambulator but reports fatigue at times  TODAY'S TREATMENT:  Eval Self care education provided including  compression at night with ace wrap and how to do this but if too difficult then to use shrinker, using vibration therapy for pain and or phanthom sensation to outside of socket when prosthesis is on but also can try to residual limb when prosthesis is off, contrast therapy of hot/cold treatment 3 min warm and 1 min cold for 3-5 cycles and to finish with warm. Use of antiperspirant to reduce sweating on residual limb. How to adjust/pull up suction sleave for improved skin contact and tighter fit Manual therapy for active compression and skilled palpation and Trigger Point Dry-Needling  Treatment instructions: Expect mild to moderate muscle soreness. Patient Consent Given: Yes Education handout provided: verbally provided Muscles treated: Left tibialis anterior and gastroc Electrical stimulation provided: Yes with milli amp current and 2 frequency as well as micro current at 50-100 frequency and intensity turned up to his tolerance. Treatment response/outcome: good overall tolerance,twitch response noted      PATIENT EDUCATION: Education details: HEP, PT plan of care, self care education  provided for phantom sensations, see above for details.  Person educated: Patient Education method: Explanation, Demonstration, Verbal cues, and Handouts Education comprehension: verbalized understanding and needs further education   HOME EXERCISE PROGRAM:   ASSESSMENT:  CLINICAL IMPRESSION: Patient referred to PT for left residal limb pain. He is below knee amputee and has relatively new prosthesis, suspension, and socket. He appears to ambulate quite well with his prosthesis and his chief complaint is more muscular pain and phantom pain/sensations.  Patient will benefit from skilled PT to address these complaints and to further his knowledge of prosthesis.   OBJECTIVE IMPAIRMENTS: decreased activity tolerance, difficulty walking, decreased balance, decreased endurance, decreased mobility, decreased strength, LE use, postural dysfunction, and pain.  ACTIVITY LIMITATIONS: bending, lifting, carry, locomotion, cleaning, community activity, driving  PERSONAL FACTORS: Rt THA (anterior approach) 07/31/21. Left TTTA, left THA (05/23/20), arthritis, DM2, HTN, HLD, PVD are also affecting patient's functional outcome.  REHAB POTENTIAL: Good  CLINICAL DECISION MAKING: Stable/uncomplicated  EVALUATION COMPLEXITY: Low    GOALS: Short term PT Goals Target date: 04/27/2023   Pt will be I and compliant with self care recommendations and name 3 ways to manage phantom sensation/pain.  Baseline:  Goal status: New Pt will decrease pain by 25% overall Baseline: Goal status: New  Long term PT goals Target date:05/11/2023  Pt will reduce pain and phantom sensations by overall 50% Baseline: Goal status: New Pt will be able to ambulate community distances at least 400 ft WFL gait pattern with less complaints of residual limb pain. Baseline: Goal status: New  PLAN: PT FREQUENCY: 1-2 times per week   PT DURATION: 4-6 weeks  PLANNED INTERVENTIONS (unless contraindicated): aquatic PT, prosthetic  training, Canalith repositioning, cryotherapy, Electrical stimulation, Iontophoresis with 4 mg/ml dexamethasome, Moist heat, traction, Ultrasound, gait training, Therapeutic exercise, balance training, neuromuscular re-education, patient/family education, prosthetic training, manual techniques, passive ROM, dry needling, taping, vasopnuematic device, vestibular, spinal manipulations, joint manipulations  PLAN FOR NEXT SESSION: how was DN and repeat if desired, trial hot-cold contrast treatment, consider cupping therapy, address any prosthetic issues.    April Manson, PT,DPT 03/30/2023, 2:08 PM

## 2023-04-04 ENCOUNTER — Ambulatory Visit (INDEPENDENT_AMBULATORY_CARE_PROVIDER_SITE_OTHER): Payer: BC Managed Care – PPO | Admitting: Physical Therapy

## 2023-04-04 ENCOUNTER — Encounter: Payer: Self-pay | Admitting: Physical Therapy

## 2023-04-04 DIAGNOSIS — M79662 Pain in left lower leg: Secondary | ICD-10-CM

## 2023-04-04 DIAGNOSIS — M25551 Pain in right hip: Secondary | ICD-10-CM | POA: Diagnosis not present

## 2023-04-04 DIAGNOSIS — R2681 Unsteadiness on feet: Secondary | ICD-10-CM

## 2023-04-04 DIAGNOSIS — R2689 Other abnormalities of gait and mobility: Secondary | ICD-10-CM

## 2023-04-04 DIAGNOSIS — M6281 Muscle weakness (generalized): Secondary | ICD-10-CM

## 2023-04-04 NOTE — Therapy (Signed)
OUTPATIENT PHYSICAL THERAPY TREATMENT   Patient Name: Anthony Park MRN: [id number] DOB:06-Aug-1961, 62 y.o., male Today's Date: 04/04/2023  END OF SESSION:  PT End of Session - 04/04/23 0900     Visit Number 2    Number of Visits 8    Date for PT Re-Evaluation 05/11/23    PT Start Time 0803    PT Stop Time 0845    PT Time Calculation (min) 42 min    Activity Tolerance Patient tolerated treatment well    Behavior During Therapy [facility] for tasks assessed/performed             Past Medical History:  Diagnosis Date   Arthritis    Diabetes mellitus without complication (HCC)    type 2 controlled with diet    Diabetic toe ulcer (HCC) 11/05/2018   History of kidney stones    passed 1 several years ago   Hyperlipidemia    Hypertension    not on medications   Peripheral vascular disease (HCC)    stent in left leg due to anerysm    Past Surgical History:  Procedure Laterality Date    Left Transmetatarsal Ampuation (Left Foot)  08/20/2020   ABDOMINAL AORTOGRAM W/LOWER EXTREMITY N/A 08/06/2020   Procedure: ABDOMINAL AORTOGRAM W/LOWER EXTREMITY;  Surgeon: [name], MD;  Location: [facility];  Service: Cardiovascular;  Laterality: N/A;   AMPUTATION Right 11/07/2018   Procedure: RIGHT 3RD TOE AMPUTATION;  Surgeon: Nadara Mustard, MD;  Location: [facility] OR;  Service: Orthopedics;  Laterality: Right;   AMPUTATION Left 08/20/2020   Procedure: Left Transmetatarsal Ampuation;  Surgeon: [name], MD;  Location: [facility] OR;  Service: Vascular;  Laterality: Left;   AMPUTATION Left 10/03/2020   Procedure: LEFT BELOW KNEE AMPUTATION;  Surgeon: [name], MD;  Location: [facility] OR;  Service: Vascular;  Laterality: Left;   AMPUTATION TOE Left    big toe and one next to it   CATARACT EXTRACTION Bilateral    EYE SURGERY Bilateral    cataracts removed   INSERTION OF ILIAC STENT Left    PERIPHERAL VASCULAR BALLOON ANGIOPLASTY Left 08/06/2020   Procedure: PERIPHERAL  VASCULAR BALLOON ANGIOPLASTY;  Surgeon: [name], MD;  Location: [facility];  Service: Cardiovascular;  Laterality: Left;  Peroneal artery.   TOE AMPUTATION Left 2015   TOTAL HIP ARTHROPLASTY Left 05/23/2020   Procedure: LEFT TOTAL HIP ARTHROPLASTY ANTERIOR APPROACH;  Surgeon: Kathryne Hitch, MD;  Location: [facility] ORS;  Service: Orthopedics;  Laterality: Left;   TOTAL HIP ARTHROPLASTY Right 07/31/2021   Procedure: RIGHT TOTAL HIP ARTHROPLASTY ANTERIOR APPROACH;  Surgeon: Kathryne Hitch, MD;  Location: [facility] ORS;  Service: Orthopedics;  Laterality: Right;   WOUND DEBRIDEMENT Left 09/11/2020   Procedure: DEBRIDEMENT OF LEFT TRANSMETATARSAL WOUND;  Surgeon: Leonie Douglas, MD;  Location: [facility] OR;  Service: Vascular;  Laterality: Left;   Patient Active Problem List   Diagnosis Date Noted   Hypogonadism, male 01/19/2023   Erectile dysfunction 01/19/2023   Aneurysm of abdominal vessel (HCC) 01/19/2023   Chronic kidney disease, stage 3a (HCC) 01/19/2023   AAA (abdominal aortic aneurysm) without rupture (HCC) 01/18/2023   S/P BKA (below knee amputation) unilateral, left (HCC) 10/03/2020   Non-healing wound of lower extremity 10/03/2020   History of amputation of left foot through metatarsal bone (HCC) 09/11/2020   PAD (peripheral artery disease) (HCC) 08/20/2020   Status post total replacement of left hip 06/05/2020   Unilateral primary osteoarthritis, right  hip 04/17/2020   Unilateral primary osteoarthritis, left hip 04/17/2020   Amputated toe, right (HCC) 11/22/2018   Type 2 diabetes mellitus with foot ulcer, without long-term current use of insulin (HCC) 11/22/2018   Mixed hyperlipidemia 11/22/2018   Aneurysm of left popliteal artery (HCC) 11/22/2018   Status post peripheral artery angioplasty with insertion of stent 11/22/2018   Osteomyelitis of third toe of right foot (HCC)    Cellulitis of third toe of right foot     PCP: Melida Quitter, MD   REFERRING  PROVIDER:   Adonis Huguenin, NP    REFERRING DIAG: [phone] (ICD-10-CM) - Below-knee amputation of left lower extremity, subsequent encounter (HCC)   THERAPY DIAG:  Pain in left lower leg  Muscle weakness (generalized)  Other abnormalities of gait and mobility  Pain in right hip  Unsteadiness on feet  Rationale for Evaluation and Treatment: Rehabilitation  ONSET DATE: 6 week onset of left residual limb pain  SUBJECTIVE:   SUBJECTIVE STATEMENT: Relays a lot of soreness after last time but then improved. He had bad spasms last night in his calf.    PERTINENT HISTORY: Rt THA (anterior approach) 07/31/21. Left TTTA, left THA (05/23/20), arthritis, DM2, HTN, HLD, PVD PAIN:  Are you having pain? Yes: NPRS scale: depends /10 Pain location: left distal and lateral residual limb at tibialis anterior and calf Pain description: fatigue, cramps, also reports phantom  Aggravating factors: wearing prosthesis too long sometimes, sometimes just has it at rest Relieving factors: taking off prosthesis  PRECAUTIONS: None  RED FLAGS: None   WEIGHT BEARING RESTRICTIONS: No  FALLS:  Has patient fallen in last 6 months? No   PATIENT GOALS: reduce pain in his leg  NEXT MD VISIT: nothing scheduled at eval  OBJECTIVE:   DIAGNOSTIC FINDINGS:   PATIENT SURVEYS:  FOTO not done, not a good category to place him in so will defer this  COGNITION: Overall cognitive status: Within functional limits for tasks assessed     SENSATION: Light touch: WFL   EDEMA:  Mild edema noted in distal legs bilat  PALPATION: Tender to palpation in left tibialis anterior and gastroc  LOWER EXTREMITY ROM:  Active ROM Right eval Left eval  Hip flexion    Hip extension    Hip abduction    Hip adduction    Hip internal rotation    Hip external rotation    Knee flexion    Knee extension    Ankle dorsiflexion    Ankle plantarflexion    Ankle inversion    Ankle eversion     (Blank rows = not  tested)  LOWER EXTREMITY MMT:  MMT Right eval Left eval  Hip flexion  WFL  Hip extension    Hip abduction    Hip adduction    Hip internal rotation    Hip external rotation    Knee flexion  WFL  Knee extension  WNL  Ankle dorsiflexion    Ankle plantarflexion    Ankle inversion    Ankle eversion     (Blank rows = not tested)    FUNCTIONAL TESTS:    GAIT: Comments: WFL using prosthesis on left, he is independent community Ambulator but reports fatigue at times    TODAY'S TREATMENT:  04/04/23 Self care education provided including contrast therapy of hot/cold treatment 3 min warm and 1 min cold for 3 cycles and to finish with warm. This was performed in clinic today with hot and cold packs wrapped around distal  residual limb  Manual therapy for light massage with 3 different textures (wash cloth, pillow case, latex glove) PT performed this one minute each X 2 rounds each with verbal instructions and education for how to perform at home. Manual therapy for active compression and skilled palpation and Trigger Point Dry-Needling  Treatment instructions: Expect mild to moderate muscle soreness. Patient Consent Given: Yes Education handout provided: verbally provided Muscles treated: Left tibialis anterior and gastroc Electrical stimulation provided: No Treatment response/outcome: good overall tolerance,twitch response noted   Eval Self care education provided including  compression at night with ace wrap and how to do this but if too difficult then to use shrinker, using vibration therapy for pain and or phanthom sensation to outside of socket when prosthesis is on but also can try to residual limb when prosthesis is off, contrast therapy of hot/cold treatment 3 min warm and 1 min cold for 3-5 cycles and to finish with warm. Use of antiperspirant to reduce sweating on residual limb. How to adjust/pull up suction sleave for improved skin contact and tighter fit Manual therapy for  active compression and skilled palpation and Trigger Point Dry-Needling  Treatment instructions: Expect mild to moderate muscle soreness. Patient Consent Given: Yes Education handout provided: verbally provided Muscles treated: Left tibialis anterior and gastroc Electrical stimulation provided: Yes with milli amp current and 2 frequency as well as micro current at 50-100 frequency and intensity turned up to his tolerance. Treatment response/outcome: good overall tolerance,twitch response noted      PATIENT EDUCATION: Education details: HEP, PT plan of care, self care education provided for phantom sensations, see above for details.  Person educated: Patient Education method: Explanation, Demonstration, Verbal cues, and Handouts Education comprehension: verbalized understanding and needs further education   HOME EXERCISE PROGRAM:   ASSESSMENT:  CLINICAL IMPRESSION:Session focused on self care and manual therapy for central desensitization of his left residual limb which is his biggest complaint. I did show him how to do light touch massage with different textures as another way to de sensitize. We also tried traditional pistoning method of DN vs Estim method to see if this is any better for him.   OBJECTIVE IMPAIRMENTS: decreased activity tolerance, difficulty walking, decreased balance, decreased endurance, decreased mobility, decreased strength, LE use, postural dysfunction, and pain.  ACTIVITY LIMITATIONS: bending, lifting, carry, locomotion, cleaning, community activity, driving  PERSONAL FACTORS: Rt THA (anterior approach) 07/31/21. Left TTTA, left THA (05/23/20), arthritis, DM2, HTN, HLD, PVD are also affecting patient's functional outcome.  REHAB POTENTIAL: Good  CLINICAL DECISION MAKING: Stable/uncomplicated  EVALUATION COMPLEXITY: Low    GOALS: Short term PT Goals Target date: 04/27/2023   Pt will be I and compliant with self care recommendations and name 3 ways to  manage phantom sensation/pain.  Baseline:  Goal status: New Pt will decrease pain by 25% overall Baseline: Goal status: New  Long term PT goals Target date:05/11/2023  Pt will reduce pain and phantom sensations by overall 50% Baseline: Goal status: New Pt will be able to ambulate community distances at least 400 ft WFL gait pattern with less complaints of residual limb pain. Baseline: Goal status: New  PLAN: PT FREQUENCY: 1-2 times per week   PT DURATION: 4-6 weeks  PLANNED INTERVENTIONS (unless contraindicated): aquatic PT, prosthetic training, Canalith repositioning, cryotherapy, Electrical stimulation, Iontophoresis with 4 mg/ml dexamethasome, Moist heat, traction, Ultrasound, gait training, Therapeutic exercise, balance training, neuromuscular re-education, patient/family education, prosthetic training, manual techniques, passive ROM, dry needling, taping, vasopnuematic device, vestibular, spinal manipulations,  joint manipulations  PLAN FOR NEXT SESSION: how was DN and repeat if desired, how is texture therapy going. consider cupping therapy, address any prosthetic issues.    April Manson, PT,DPT 04/04/2023, 9:02 AM

## 2023-04-07 ENCOUNTER — Encounter: Payer: Self-pay | Admitting: Physical Therapy

## 2023-04-07 ENCOUNTER — Ambulatory Visit (INDEPENDENT_AMBULATORY_CARE_PROVIDER_SITE_OTHER): Payer: BC Managed Care – PPO | Admitting: Physical Therapy

## 2023-04-07 DIAGNOSIS — M79662 Pain in left lower leg: Secondary | ICD-10-CM | POA: Diagnosis not present

## 2023-04-07 DIAGNOSIS — M6281 Muscle weakness (generalized): Secondary | ICD-10-CM

## 2023-04-07 DIAGNOSIS — R2689 Other abnormalities of gait and mobility: Secondary | ICD-10-CM | POA: Diagnosis not present

## 2023-04-07 NOTE — Therapy (Signed)
OUTPATIENT PHYSICAL THERAPY TREATMENT   Patient Name: Anthony Park MRN: [id number] DOB:1961/06/18, 62 y.o., male Today's Date: 04/07/2023  END OF SESSION:  PT End of Session - 04/07/23 0916     Visit Number 3    Number of Visits 8    Date for PT Re-Evaluation 05/11/23    PT Start Time 0802    PT Stop Time 0846    PT Time Calculation (min) 44 min    Activity Tolerance Patient tolerated treatment well    Behavior During Therapy [facility] for tasks assessed/performed             Past Medical History:  Diagnosis Date   Arthritis    Diabetes mellitus without complication (HCC)    type 2 controlled with diet    Diabetic toe ulcer (HCC) 11/05/2018   History of kidney stones    passed 1 several years ago   Hyperlipidemia    Hypertension    not on medications   Peripheral vascular disease (HCC)    stent in left leg due to anerysm    Past Surgical History:  Procedure Laterality Date    Left Transmetatarsal Ampuation (Left Foot)  08/20/2020   ABDOMINAL AORTOGRAM W/LOWER EXTREMITY N/A 08/06/2020   Procedure: ABDOMINAL AORTOGRAM W/LOWER EXTREMITY;  Surgeon: [name], MD;  Location: [facility];  Service: Cardiovascular;  Laterality: N/A;   AMPUTATION Right 11/07/2018   Procedure: RIGHT 3RD TOE AMPUTATION;  Surgeon: Nadara Mustard, MD;  Location: [facility] OR;  Service: Orthopedics;  Laterality: Right;   AMPUTATION Left 08/20/2020   Procedure: Left Transmetatarsal Ampuation;  Surgeon: [name], MD;  Location: [facility] OR;  Service: Vascular;  Laterality: Left;   AMPUTATION Left 10/03/2020   Procedure: LEFT BELOW KNEE AMPUTATION;  Surgeon: [name], MD;  Location: [facility] OR;  Service: Vascular;  Laterality: Left;   AMPUTATION TOE Left    big toe and one next to it   CATARACT EXTRACTION Bilateral    EYE SURGERY Bilateral    cataracts removed   INSERTION OF ILIAC STENT Left    PERIPHERAL VASCULAR BALLOON ANGIOPLASTY Left 08/06/2020   Procedure: PERIPHERAL  VASCULAR BALLOON ANGIOPLASTY;  Surgeon: [name], MD;  Location: [facility];  Service: Cardiovascular;  Laterality: Left;  Peroneal artery.   TOE AMPUTATION Left 2015   TOTAL HIP ARTHROPLASTY Left 05/23/2020   Procedure: LEFT TOTAL HIP ARTHROPLASTY ANTERIOR APPROACH;  Surgeon: Kathryne Hitch, MD;  Location: [facility];  Service: Orthopedics;  Laterality: Left;   TOTAL HIP ARTHROPLASTY Right 07/31/2021   Procedure: RIGHT TOTAL HIP ARTHROPLASTY ANTERIOR APPROACH;  Surgeon: Kathryne Hitch, MD;  Location: [facility];  Service: Orthopedics;  Laterality: Right;   WOUND DEBRIDEMENT Left 09/11/2020   Procedure: DEBRIDEMENT OF LEFT TRANSMETATARSAL WOUND;  Surgeon: Leonie Douglas, MD;  Location: [facility] OR;  Service: Vascular;  Laterality: Left;   Patient Active Problem List   Diagnosis Date Noted   Hypogonadism, male 01/19/2023   Erectile dysfunction 01/19/2023   Aneurysm of abdominal vessel (HCC) 01/19/2023   Chronic kidney disease, stage 3a (HCC) 01/19/2023   AAA (abdominal aortic aneurysm) without rupture (HCC) 01/18/2023   S/P BKA (below knee amputation) unilateral, left (HCC) 10/03/2020   Non-healing wound of lower extremity 10/03/2020   History of amputation of left foot through metatarsal bone (HCC) 09/11/2020   PAD (peripheral artery disease) (HCC) 08/20/2020   Status post total replacement of left hip 06/05/2020   Unilateral primary osteoarthritis, right  hip 04/17/2020   Unilateral primary osteoarthritis, left hip 04/17/2020   Amputated toe, right (HCC) 11/22/2018   Type 2 diabetes mellitus with foot ulcer, without long-term current use of insulin (HCC) 11/22/2018   Mixed hyperlipidemia 11/22/2018   Aneurysm of left popliteal artery (HCC) 11/22/2018   Status post peripheral artery angioplasty with insertion of stent 11/22/2018   Osteomyelitis of third toe of right foot (HCC)    Cellulitis of third toe of right foot     PCP: Melida Quitter, MD   REFERRING  PROVIDER:   Adonis Huguenin, NP    REFERRING DIAG: [phone] (ICD-10-CM) - Below-knee amputation of left lower extremity, subsequent encounter (HCC)   THERAPY DIAG:  Pain in left lower leg  Muscle weakness (generalized)  Other abnormalities of gait and mobility  Rationale for Evaluation and Treatment: Rehabilitation  ONSET DATE: 6 week onset of left residual limb pain  SUBJECTIVE:   SUBJECTIVE STATEMENT: Relays a lot of soreness after last time but then improved and had some really good days after. He is still getting some cramping in calf.  PERTINENT HISTORY: Rt THA (anterior approach) 07/31/21. Left TTTA, left THA (05/23/20), arthritis, DM2, HTN, HLD, PVD PAIN:  Are you having pain? Yes: NPRS scale: depends /10 Pain location: left distal and lateral residual limb at tibialis anterior and calf Pain description: fatigue, cramps, also reports phantom  Aggravating factors: wearing prosthesis too long sometimes, sometimes just has it at rest Relieving factors: taking off prosthesis  PRECAUTIONS: None  RED FLAGS: None   WEIGHT BEARING RESTRICTIONS: No  FALLS:  Has patient fallen in last 6 months? No   PATIENT GOALS: reduce pain in his leg  NEXT MD VISIT: nothing scheduled at eval  OBJECTIVE:   DIAGNOSTIC FINDINGS:   PATIENT SURVEYS:  FOTO not done, not a good category to place him in so will defer this  COGNITION: Overall cognitive status: Within functional limits for tasks assessed     SENSATION: Light touch: WFL   EDEMA:  Mild edema noted in distal legs bilat  PALPATION: Tender to palpation in left tibialis anterior and gastroc  LOWER EXTREMITY ROM:  Active ROM Right eval Left eval  Hip flexion    Hip extension    Hip abduction    Hip adduction    Hip internal rotation    Hip external rotation    Knee flexion    Knee extension    Ankle dorsiflexion    Ankle plantarflexion    Ankle inversion    Ankle eversion     (Blank rows = not  tested)  LOWER EXTREMITY MMT:  MMT Right eval Left eval  Hip flexion  WFL  Hip extension    Hip abduction    Hip adduction    Hip internal rotation    Hip external rotation    Knee flexion  WFL  Knee extension  WNL  Ankle dorsiflexion    Ankle plantarflexion    Ankle inversion    Ankle eversion     (Blank rows = not tested)    FUNCTIONAL TESTS:    GAIT: Comments: WFL using prosthesis on left, he is independent community Ambulator but reports fatigue at times    TODAY'S TREATMENT:  04/07/23 Self care education provided including contrast therapy of hot/cold treatment 3 min warm and 1 min cold for 3 cycles and to finish with warm. This was performed in clinic today with hot and cold packs wrapped around distal residual limb  Manual therapy  for light to high pressure with cupping therapy to left residual limb over tibialis anterior and gastroc.  Manual therapy for active compression and skilled palpation and Trigger Point Dry-Needling  Treatment instructions: Expect mild to moderate muscle soreness. Patient Consent Given: Yes Education handout provided: verbally provided Muscles treated: Left tibialis anterior and gastroc Electrical stimulation provided: Yes milliamp current at frequency 80, with intensity as tolerated 2-4 Treatment response/outcome: good overall tolerance,twitch response noted   04/04/23 Self care education provided including contrast therapy of hot/cold treatment 3 min warm and 1 min cold for 3 cycles and to finish with warm. This was performed in clinic today with hot and cold packs wrapped around distal residual limb  Manual therapy for light massage with 3 different textures (wash cloth, pillow case, latex glove) PT performed this one minute each X 2 rounds each with verbal instructions and education for how to perform at home. Manual therapy for active compression and skilled palpation and Trigger Point Dry-Needling  Treatment instructions: Expect  mild to moderate muscle soreness. Patient Consent Given: Yes Education handout provided: verbally provided Muscles treated: Left tibialis anterior and gastroc Electrical stimulation provided: No Treatment response/outcome: good overall tolerance,twitch response noted   Eval Self care education provided including  compression at night with ace wrap and how to do this but if too difficult then to use shrinker, using vibration therapy for pain and or phanthom sensation to outside of socket when prosthesis is on but also can try to residual limb when prosthesis is off, contrast therapy of hot/cold treatment 3 min warm and 1 min cold for 3-5 cycles and to finish with warm. Use of antiperspirant to reduce sweating on residual limb. How to adjust/pull up suction sleave for improved skin contact and tighter fit Manual therapy for active compression and skilled palpation and Trigger Point Dry-Needling  Treatment instructions: Expect mild to moderate muscle soreness. Patient Consent Given: Yes Education handout provided: verbally provided Muscles treated: Left tibialis anterior and gastroc Electrical stimulation provided: Yes with milli amp current and 2 frequency as well as micro current at 50-100 frequency and intensity turned up to his tolerance. Treatment response/outcome: good overall tolerance,twitch response noted      PATIENT EDUCATION: Education details: HEP, PT plan of care, self care education provided for phantom sensations, see above for details.  Person educated: Patient Education method: Explanation, Demonstration, Verbal cues, and Handouts Education comprehension: verbalized understanding and needs further education   HOME EXERCISE PROGRAM:   ASSESSMENT:  CLINICAL IMPRESSION: He appears to have increased pain day of treatment but then has several good days after words. We did add in cupping therapy for additional stimulus in efforts do decrease his phantom limb pains.    OBJECTIVE IMPAIRMENTS: decreased activity tolerance, difficulty walking, decreased balance, decreased endurance, decreased mobility, decreased strength, LE use, postural dysfunction, and pain.  ACTIVITY LIMITATIONS: bending, lifting, carry, locomotion, cleaning, community activity, driving  PERSONAL FACTORS: Rt THA (anterior approach) 07/31/21. Left TTTA, left THA (05/23/20), arthritis, DM2, HTN, HLD, PVD are also affecting patient's functional outcome.  REHAB POTENTIAL: Good  CLINICAL DECISION MAKING: Stable/uncomplicated  EVALUATION COMPLEXITY: Low    GOALS: Short term PT Goals Target date: 04/27/2023   Pt will be I and compliant with self care recommendations and name 3 ways to manage phantom sensation/pain.  Baseline:  Goal status: New Pt will decrease pain by 25% overall Baseline: Goal status: New  Long term PT goals Target date:05/11/2023  Pt will reduce pain and phantom sensations by  overall 50% Baseline: Goal status: New Pt will be able to ambulate community distances at least 400 ft [facility] gait pattern with less complaints of residual limb pain. Baseline: Goal status: New  PLAN: PT FREQUENCY: 1-2 times per week   PT DURATION: 4-6 weeks  PLANNED INTERVENTIONS (unless contraindicated): aquatic PT, prosthetic training, Canalith repositioning, cryotherapy, Electrical stimulation, Iontophoresis with 4 mg/ml dexamethasome, Moist heat, traction, Ultrasound, gait training, Therapeutic exercise, balance training, neuromuscular re-education, patient/family education, prosthetic training, manual techniques, passive ROM, dry needling, taping, vasopnuematic device, vestibular, spinal manipulations, joint manipulations  PLAN FOR NEXT SESSION: how was cupping?    April Manson, PT,DPT 04/07/2023, 9:19 AM

## 2023-04-11 ENCOUNTER — Ambulatory Visit (INDEPENDENT_AMBULATORY_CARE_PROVIDER_SITE_OTHER): Payer: BC Managed Care – PPO | Admitting: Physical Therapy

## 2023-04-11 ENCOUNTER — Encounter: Payer: Self-pay | Admitting: Physical Therapy

## 2023-04-11 DIAGNOSIS — R2689 Other abnormalities of gait and mobility: Secondary | ICD-10-CM | POA: Diagnosis not present

## 2023-04-11 DIAGNOSIS — M79662 Pain in left lower leg: Secondary | ICD-10-CM | POA: Diagnosis not present

## 2023-04-11 DIAGNOSIS — M6281 Muscle weakness (generalized): Secondary | ICD-10-CM | POA: Diagnosis not present

## 2023-04-11 NOTE — Therapy (Signed)
OUTPATIENT PHYSICAL THERAPY TREATMENT   Patient Name: Anthony Park MRN: [id number] DOB:Jan 15, 1961, 62 y.o., male Today's Date: 04/11/2023  END OF SESSION:  PT End of Session - 04/11/23 0852     Visit Number 4    Number of Visits 8    Date for PT Re-Evaluation 05/11/23    PT Start Time 0806    PT Stop Time 0846    PT Time Calculation (min) 40 min    Activity Tolerance Patient tolerated treatment well    Behavior During Therapy [facility] for tasks assessed/performed             Past Medical History:  Diagnosis Date   Arthritis    Diabetes mellitus without complication (HCC)    type 2 controlled with diet    Diabetic toe ulcer (HCC) 11/05/2018   History of kidney stones    passed 1 several years ago   Hyperlipidemia    Hypertension    not on medications   Peripheral vascular disease (HCC)    stent in left leg due to anerysm    Past Surgical History:  Procedure Laterality Date    Left Transmetatarsal Ampuation (Left Foot)  08/20/2020   ABDOMINAL AORTOGRAM W/LOWER EXTREMITY N/A 08/06/2020   Procedure: ABDOMINAL AORTOGRAM W/LOWER EXTREMITY;  Surgeon: [name], MD;  Location: [facility];  Service: Cardiovascular;  Laterality: N/A;   AMPUTATION Right 11/07/2018   Procedure: RIGHT 3RD TOE AMPUTATION;  Surgeon: Nadara Mustard, MD;  Location: [facility] OR;  Service: Orthopedics;  Laterality: Right;   AMPUTATION Left 08/20/2020   Procedure: Left Transmetatarsal Ampuation;  Surgeon: [name], MD;  Location: [facility] OR;  Service: Vascular;  Laterality: Left;   AMPUTATION Left 10/03/2020   Procedure: LEFT BELOW KNEE AMPUTATION;  Surgeon: [name], MD;  Location: [facility] OR;  Service: Vascular;  Laterality: Left;   AMPUTATION TOE Left    big toe and one next to it   CATARACT EXTRACTION Bilateral    EYE SURGERY Bilateral    cataracts removed   INSERTION OF ILIAC STENT Left    PERIPHERAL VASCULAR BALLOON ANGIOPLASTY Left 08/06/2020   Procedure: PERIPHERAL  VASCULAR BALLOON ANGIOPLASTY;  Surgeon: [name], MD;  Location: [facility];  Service: Cardiovascular;  Laterality: Left;  Peroneal artery.   TOE AMPUTATION Left 2015   TOTAL HIP ARTHROPLASTY Left 05/23/2020   Procedure: LEFT TOTAL HIP ARTHROPLASTY ANTERIOR APPROACH;  Surgeon: Kathryne Hitch, MD;  Location: [facility];  Service: Orthopedics;  Laterality: Left;   TOTAL HIP ARTHROPLASTY Right 07/31/2021   Procedure: RIGHT TOTAL HIP ARTHROPLASTY ANTERIOR APPROACH;  Surgeon: Kathryne Hitch, MD;  Location: [facility];  Service: Orthopedics;  Laterality: Right;   WOUND DEBRIDEMENT Left 09/11/2020   Procedure: DEBRIDEMENT OF LEFT TRANSMETATARSAL WOUND;  Surgeon: Leonie Douglas, MD;  Location: [facility] OR;  Service: Vascular;  Laterality: Left;   Patient Active Problem List   Diagnosis Date Noted   Hypogonadism, male 01/19/2023   Erectile dysfunction 01/19/2023   Aneurysm of abdominal vessel (HCC) 01/19/2023   Chronic kidney disease, stage 3a (HCC) 01/19/2023   AAA (abdominal aortic aneurysm) without rupture (HCC) 01/18/2023   S/P BKA (below knee amputation) unilateral, left (HCC) 10/03/2020   Non-healing wound of lower extremity 10/03/2020   History of amputation of left foot through metatarsal bone (HCC) 09/11/2020   PAD (peripheral artery disease) (HCC) 08/20/2020   Status post total replacement of left hip 06/05/2020   Unilateral primary osteoarthritis, right  hip 04/17/2020   Unilateral primary osteoarthritis, left hip 04/17/2020   Amputated toe, right (HCC) 11/22/2018   Type 2 diabetes mellitus with foot ulcer, without long-term current use of insulin (HCC) 11/22/2018   Mixed hyperlipidemia 11/22/2018   Aneurysm of left popliteal artery (HCC) 11/22/2018   Status post peripheral artery angioplasty with insertion of stent 11/22/2018   Osteomyelitis of third toe of right foot (HCC)    Cellulitis of third toe of right foot     PCP: Melida Quitter, MD   REFERRING  PROVIDER:   Adonis Huguenin, NP    REFERRING DIAG: [phone] (ICD-10-CM) - Below-knee amputation of left lower extremity, subsequent encounter (HCC)   THERAPY DIAG:  Pain in left lower leg  Muscle weakness (generalized)  Other abnormalities of gait and mobility  Rationale for Evaluation and Treatment: Rehabilitation  ONSET DATE: 6 week onset of left residual limb pain  SUBJECTIVE:   SUBJECTIVE STATEMENT: Relays the pain appears to be improving. He feels less phantom sensations of his foot and it is more in the shin area. He did get a cupping set for home and has been using it.   PERTINENT HISTORY: Rt THA (anterior approach) 07/31/21. Left TTTA, left THA (05/23/20), arthritis, DM2, HTN, HLD, PVD PAIN:  Are you having pain? Yes: NPRS scale: depends /10 Pain location: left distal and lateral residual limb at tibialis anterior and calf Pain description: fatigue, cramps, also reports phantom  Aggravating factors: wearing prosthesis too long sometimes, sometimes just has it at rest Relieving factors: taking off prosthesis  PRECAUTIONS: None  RED FLAGS: None   WEIGHT BEARING RESTRICTIONS: No  FALLS:  Has patient fallen in last 6 months? No   PATIENT GOALS: reduce pain in his leg  NEXT MD VISIT: nothing scheduled at eval  OBJECTIVE:   DIAGNOSTIC FINDINGS:   PATIENT SURVEYS:  FOTO not done, not a good category to place him in so will defer this  COGNITION: Overall cognitive status: Within functional limits for tasks assessed     SENSATION: Light touch: WFL   EDEMA:  Mild edema noted in distal legs bilat  PALPATION: Tender to palpation in left tibialis anterior and gastroc  LOWER EXTREMITY ROM:  Active ROM Right eval Left eval  Hip flexion    Hip extension    Hip abduction    Hip adduction    Hip internal rotation    Hip external rotation    Knee flexion    Knee extension    Ankle dorsiflexion    Ankle plantarflexion    Ankle inversion    Ankle  eversion     (Blank rows = not tested)  LOWER EXTREMITY MMT:  MMT Right eval Left eval  Hip flexion  WFL  Hip extension    Hip abduction    Hip adduction    Hip internal rotation    Hip external rotation    Knee flexion  WFL  Knee extension  WNL  Ankle dorsiflexion    Ankle plantarflexion    Ankle inversion    Ankle eversion     (Blank rows = not tested)    FUNCTIONAL TESTS:    GAIT: Comments: WFL using prosthesis on left, he is independent community Ambulator but reports fatigue at times    TODAY'S TREATMENT:  04/11/23 Self care education provided including contrast therapy of hot/cold treatment 3 min warm and 1 min cold for 3 cycles and to finish with warm. This was performed in clinic today with hot  and cold packs wrapped around distal residual limb  Manual therapy for active compression and skilled palpation and Trigger Point Dry-Needling  Treatment instructions: Expect mild to moderate muscle soreness. Patient Consent Given: Yes Education handout provided: verbally provided Muscles treated: Left tibialis anterior and gastroc Electrical stimulation provided: Yes milliamp current at frequency 30-50, with intensity as tolerated 2-4 Treatment response/outcome: good overall tolerance,twitch response noted   04/07/23 Self care education provided including contrast therapy of hot/cold treatment 3 min warm and 1 min cold for 3 cycles and to finish with warm. This was performed in clinic today with hot and cold packs wrapped around distal residual limb  Manual therapy for light to high pressure with cupping therapy to left residual limb over tibialis anterior and gastroc.  Manual therapy for active compression and skilled palpation and Trigger Point Dry-Needling  Treatment instructions: Expect mild to moderate muscle soreness. Patient Consent Given: Yes Education handout provided: verbally provided Muscles treated: Left tibialis anterior and gastroc Electrical  stimulation provided: Yes milliamp current at frequency 80, with intensity as tolerated 2-4 Treatment response/outcome: good overall tolerance,twitch response noted   04/04/23 Self care education provided including contrast therapy of hot/cold treatment 3 min warm and 1 min cold for 3 cycles and to finish with warm. This was performed in clinic today with hot and cold packs wrapped around distal residual limb  Manual therapy for light massage with 3 different textures (wash cloth, pillow case, latex glove) PT performed this one minute each X 2 rounds each with verbal instructions and education for how to perform at home. Manual therapy for active compression and skilled palpation and Trigger Point Dry-Needling  Treatment instructions: Expect mild to moderate muscle soreness. Patient Consent Given: Yes Education handout provided: verbally provided Muscles treated: Left tibialis anterior and gastroc Electrical stimulation provided: No Treatment response/outcome: good overall tolerance,twitch response noted      PATIENT EDUCATION: Education details: HEP, PT plan of care, self care education provided for phantom sensations, see above for details.  Person educated: Patient Education method: Programmer, multimedia, Demonstration, Verbal cues, and Handouts Education comprehension: verbalized understanding and needs further education   HOME EXERCISE PROGRAM:   ASSESSMENT:  CLINICAL IMPRESSION: He appears to be showing some progress with less pain in residual limb and less phantom sensations. We continued with current treatment plan for nerve desensitization technques  OBJECTIVE IMPAIRMENTS: decreased activity tolerance, difficulty walking, decreased balance, decreased endurance, decreased mobility, decreased strength, LE use, postural dysfunction, and pain.  ACTIVITY LIMITATIONS: bending, lifting, carry, locomotion, cleaning, community activity, driving  PERSONAL FACTORS: Rt THA (anterior approach)  07/31/21. Left TTTA, left THA (05/23/20), arthritis, DM2, HTN, HLD, PVD are also affecting patient's functional outcome.  REHAB POTENTIAL: Good  CLINICAL DECISION MAKING: Stable/uncomplicated  EVALUATION COMPLEXITY: Low    GOALS: Short term PT Goals Target date: 04/27/2023   Pt will be I and compliant with self care recommendations and name 3 ways to manage phantom sensation/pain.  Baseline:  Goal status: New Pt will decrease pain by 25% overall Baseline: Goal status: New  Long term PT goals Target date:05/11/2023  Pt will reduce pain and phantom sensations by overall 50% Baseline: Goal status: New Pt will be able to ambulate community distances at least 400 ft WFL gait pattern with less complaints of residual limb pain. Baseline: Goal status: New  PLAN: PT FREQUENCY: 1-2 times per week   PT DURATION: 4-6 weeks  PLANNED INTERVENTIONS (unless contraindicated): aquatic PT, prosthetic training, Canalith repositioning, cryotherapy, Electrical stimulation, Iontophoresis with  4 mg/ml dexamethasome, Moist heat, traction, Ultrasound, gait training, Therapeutic exercise, balance training, neuromuscular re-education, patient/family education, prosthetic training, manual techniques, passive ROM, dry needling, taping, vasopnuematic device, vestibular, spinal manipulations, joint manipulations  PLAN FOR NEXT SESSION: continue with nerve desensitization techniques.     April Manson, PT,DPT 04/11/2023, 8:53 AM

## 2023-04-14 ENCOUNTER — Encounter: Payer: Self-pay | Admitting: Physical Therapy

## 2023-04-14 ENCOUNTER — Ambulatory Visit (INDEPENDENT_AMBULATORY_CARE_PROVIDER_SITE_OTHER): Payer: BC Managed Care – PPO | Admitting: Physical Therapy

## 2023-04-14 DIAGNOSIS — R2689 Other abnormalities of gait and mobility: Secondary | ICD-10-CM

## 2023-04-14 DIAGNOSIS — M79662 Pain in left lower leg: Secondary | ICD-10-CM

## 2023-04-14 DIAGNOSIS — M6281 Muscle weakness (generalized): Secondary | ICD-10-CM

## 2023-04-14 NOTE — Therapy (Signed)
OUTPATIENT PHYSICAL THERAPY TREATMENT   Patient Name: Anthony Park MRN: [id number] DOB:Aug 24, 1961, 62 y.o., male Today's Date: 04/14/2023  END OF SESSION:  PT End of Session - 04/14/23 0801     Visit Number 5    Number of Visits 8    Date for PT Re-Evaluation 05/11/23    PT Start Time 0800    PT Stop Time 0840    PT Time Calculation (min) 40 min    Activity Tolerance Patient tolerated treatment well    Behavior During Therapy [facility] for tasks assessed/performed             Past Medical History:  Diagnosis Date   Arthritis    Diabetes mellitus without complication (HCC)    type 2 controlled with diet    Diabetic toe ulcer (HCC) 11/05/2018   History of kidney stones    passed 1 several years ago   Hyperlipidemia    Hypertension    not on medications   Peripheral vascular disease (HCC)    stent in left leg due to anerysm    Past Surgical History:  Procedure Laterality Date    Left Transmetatarsal Ampuation (Left Foot)  08/20/2020   ABDOMINAL AORTOGRAM W/LOWER EXTREMITY N/A 08/06/2020   Procedure: ABDOMINAL AORTOGRAM W/LOWER EXTREMITY;  Surgeon: [name], MD;  Location: [facility];  Service: Cardiovascular;  Laterality: N/A;   AMPUTATION Right 11/07/2018   Procedure: RIGHT 3RD TOE AMPUTATION;  Surgeon: Nadara Mustard, MD;  Location: [facility] OR;  Service: Orthopedics;  Laterality: Right;   AMPUTATION Left 08/20/2020   Procedure: Left Transmetatarsal Ampuation;  Surgeon: [name], MD;  Location: [facility] OR;  Service: Vascular;  Laterality: Left;   AMPUTATION Left 10/03/2020   Procedure: LEFT BELOW KNEE AMPUTATION;  Surgeon: [name], MD;  Location: [facility] OR;  Service: Vascular;  Laterality: Left;   AMPUTATION TOE Left    big toe and one next to it   CATARACT EXTRACTION Bilateral    EYE SURGERY Bilateral    cataracts removed   INSERTION OF ILIAC STENT Left    PERIPHERAL VASCULAR BALLOON ANGIOPLASTY Left 08/06/2020   Procedure: PERIPHERAL  VASCULAR BALLOON ANGIOPLASTY;  Surgeon: [name], MD;  Location: [facility];  Service: Cardiovascular;  Laterality: Left;  Peroneal artery.   TOE AMPUTATION Left 2015   TOTAL HIP ARTHROPLASTY Left 05/23/2020   Procedure: LEFT TOTAL HIP ARTHROPLASTY ANTERIOR APPROACH;  Surgeon: Kathryne Hitch, MD;  Location: [facility];  Service: Orthopedics;  Laterality: Left;   TOTAL HIP ARTHROPLASTY Right 07/31/2021   Procedure: RIGHT TOTAL HIP ARTHROPLASTY ANTERIOR APPROACH;  Surgeon: Kathryne Hitch, MD;  Location: [facility];  Service: Orthopedics;  Laterality: Right;   WOUND DEBRIDEMENT Left 09/11/2020   Procedure: DEBRIDEMENT OF LEFT TRANSMETATARSAL WOUND;  Surgeon: Leonie Douglas, MD;  Location: [facility] OR;  Service: Vascular;  Laterality: Left;   Patient Active Problem List   Diagnosis Date Noted   Hypogonadism, male 01/19/2023   Erectile dysfunction 01/19/2023   Aneurysm of abdominal vessel (HCC) 01/19/2023   Chronic kidney disease, stage 3a (HCC) 01/19/2023   AAA (abdominal aortic aneurysm) without rupture (HCC) 01/18/2023   S/P BKA (below knee amputation) unilateral, left (HCC) 10/03/2020   Non-healing wound of lower extremity 10/03/2020   History of amputation of left foot through metatarsal bone (HCC) 09/11/2020   PAD (peripheral artery disease) (HCC) 08/20/2020   Status post total replacement of left hip 06/05/2020   Unilateral primary osteoarthritis, right  hip 04/17/2020   Unilateral primary osteoarthritis, left hip 04/17/2020   Amputated toe, right (HCC) 11/22/2018   Type 2 diabetes mellitus with foot ulcer, without long-term current use of insulin (HCC) 11/22/2018   Mixed hyperlipidemia 11/22/2018   Aneurysm of left popliteal artery (HCC) 11/22/2018   Status post peripheral artery angioplasty with insertion of stent 11/22/2018   Osteomyelitis of third toe of right foot (HCC)    Cellulitis of third toe of right foot     PCP: Melida Quitter, MD   REFERRING  PROVIDER:   Adonis Huguenin, NP    REFERRING DIAG: [phone] (ICD-10-CM) - Below-knee amputation of left lower extremity, subsequent encounter (HCC)   THERAPY DIAG:  Pain in left lower leg  Muscle weakness (generalized)  Other abnormalities of gait and mobility  Rationale for Evaluation and Treatment: Rehabilitation  ONSET DATE: 6 week onset of left residual limb pain  SUBJECTIVE:   SUBJECTIVE STATEMENT: Relays the pain continues to improve but can still happen at night and first thing in the morning.   PERTINENT HISTORY: Rt THA (anterior approach) 07/31/21. Left TTTA, left THA (05/23/20), arthritis, DM2, HTN, HLD, PVD PAIN:  Are you having pain? Yes: NPRS scale: depends /10 Pain location: left distal and lateral residual limb at tibialis anterior and calf Pain description: fatigue, cramps, also reports phantom  Aggravating factors: wearing prosthesis too long sometimes, sometimes just has it at rest Relieving factors: taking off prosthesis  PRECAUTIONS: None  RED FLAGS: None   WEIGHT BEARING RESTRICTIONS: No  FALLS:  Has patient fallen in last 6 months? No   PATIENT GOALS: reduce pain in his leg  NEXT MD VISIT: nothing scheduled at eval  OBJECTIVE:   DIAGNOSTIC FINDINGS:   PATIENT SURVEYS:  FOTO not done, not a good category to place him in so will defer this  COGNITION: Overall cognitive status: Within functional limits for tasks assessed     SENSATION: Light touch: WFL   EDEMA:  Mild edema noted in distal legs bilat  PALPATION: Tender to palpation in left tibialis anterior and gastroc  LOWER EXTREMITY ROM:  Active ROM Right eval Left eval  Hip flexion    Hip extension    Hip abduction    Hip adduction    Hip internal rotation    Hip external rotation    Knee flexion    Knee extension    Ankle dorsiflexion    Ankle plantarflexion    Ankle inversion    Ankle eversion     (Blank rows = not tested)  LOWER EXTREMITY MMT:  MMT  Right eval Left eval  Hip flexion  WFL  Hip extension    Hip abduction    Hip adduction    Hip internal rotation    Hip external rotation    Knee flexion  WFL  Knee extension  WNL  Ankle dorsiflexion    Ankle plantarflexion    Ankle inversion    Ankle eversion     (Blank rows = not tested)    FUNCTIONAL TESTS:    GAIT: Comments: WFL using prosthesis on left, he is independent community Ambulator but reports fatigue at times    TODAY'S TREATMENT:  04/14/23  Manual therapy for STM with cupping to left residual limb below knee as well as active compression and skilled palpation and Trigger Point Dry-Needling  Treatment instructions: Expect mild to moderate muscle soreness. Patient Consent Given: Yes Education handout provided: verbally provided Muscles treated: Left tibialis anterior and gastroc Electrical  stimulation provided: Yes milliamp current at frequency 50, with intensity as tolerated 2-4 Treatment response/outcome: good overall tolerance,twitch response noted   04/11/23 Self care education provided including contrast therapy of hot/cold treatment 3 min warm and 1 min cold for 3 cycles and to finish with warm. This was performed in clinic today with hot and cold packs wrapped around distal residual limb  Manual therapy for active compression and skilled palpation and Trigger Point Dry-Needling  Treatment instructions: Expect mild to moderate muscle soreness. Patient Consent Given: Yes Education handout provided: verbally provided Muscles treated: Left tibialis anterior and gastroc Electrical stimulation provided: Yes milliamp current at frequency 30-50, with intensity as tolerated 2-4 Treatment response/outcome: good overall tolerance,twitch response noted   04/07/23 Self care education provided including contrast therapy of hot/cold treatment 3 min warm and 1 min cold for 3 cycles and to finish with warm. This was performed in clinic today with hot and cold packs  wrapped around distal residual limb  Manual therapy for light to high pressure with cupping therapy to left residual limb over tibialis anterior and gastroc.  Manual therapy for active compression and skilled palpation and Trigger Point Dry-Needling  Treatment instructions: Expect mild to moderate muscle soreness. Patient Consent Given: Yes Education handout provided: verbally provided Muscles treated: Left tibialis anterior and gastroc Electrical stimulation provided: Yes milliamp current at frequency 80, with intensity as tolerated 2-4 Treatment response/outcome: good overall tolerance,twitch response noted   04/04/23 Self care education provided including contrast therapy of hot/cold treatment 3 min warm and 1 min cold for 3 cycles and to finish with warm. This was performed in clinic today with hot and cold packs wrapped around distal residual limb  Manual therapy for light massage with 3 different textures (wash cloth, pillow case, latex glove) PT performed this one minute each X 2 rounds each with verbal instructions and education for how to perform at home. Manual therapy for active compression and skilled palpation and Trigger Point Dry-Needling  Treatment instructions: Expect mild to moderate muscle soreness. Patient Consent Given: Yes Education handout provided: verbally provided Muscles treated: Left tibialis anterior and gastroc Electrical stimulation provided: No Treatment response/outcome: good overall tolerance,twitch response noted      PATIENT EDUCATION: Education details: HEP, PT plan of care, self care education provided for phantom sensations, see above for details.  Person educated: Patient Education method: Explanation, Demonstration, Verbal cues, and Handouts Education comprehension: verbalized understanding and needs further education   HOME EXERCISE PROGRAM:   ASSESSMENT:  CLINICAL IMPRESSION: He continues to relay some progress with his pain management  although still present. We continued with DN combined with estim and performed cupping therapy today and he reports instant pain relief.   OBJECTIVE IMPAIRMENTS: decreased activity tolerance, difficulty walking, decreased balance, decreased endurance, decreased mobility, decreased strength, LE use, postural dysfunction, and pain.  ACTIVITY LIMITATIONS: bending, lifting, carry, locomotion, cleaning, community activity, driving  PERSONAL FACTORS: Rt THA (anterior approach) 07/31/21. Left TTTA, left THA (05/23/20), arthritis, DM2, HTN, HLD, PVD are also affecting patient's functional outcome.  REHAB POTENTIAL: Good  CLINICAL DECISION MAKING: Stable/uncomplicated  EVALUATION COMPLEXITY: Low    GOALS: Short term PT Goals Target date: 04/27/2023   Pt will be I and compliant with self care recommendations and name 3 ways to manage phantom sensation/pain.  Baseline:  Goal status: New Pt will decrease pain by 25% overall Baseline: Goal status: New  Long term PT goals Target date:05/11/2023  Pt will reduce pain and phantom sensations by overall  50% Baseline: Goal status: New Pt will be able to ambulate community distances at least 400 ft [facility] gait pattern with less complaints of residual limb pain. Baseline: Goal status: New  PLAN: PT FREQUENCY: 1-2 times per week   PT DURATION: 4-6 weeks  PLANNED INTERVENTIONS (unless contraindicated): aquatic PT, prosthetic training, Canalith repositioning, cryotherapy, Electrical stimulation, Iontophoresis with 4 mg/ml dexamethasome, Moist heat, traction, Ultrasound, gait training, Therapeutic exercise, balance training, neuromuscular re-education, patient/family education, prosthetic training, manual techniques, passive ROM, dry needling, taping, vasopnuematic device, vestibular, spinal manipulations, joint manipulations  PLAN FOR NEXT SESSION: continue with nerve desensitization techniques.     April Manson, PT,DPT 04/14/2023, 8:35 AM

## 2023-04-18 ENCOUNTER — Encounter: Payer: Self-pay | Admitting: Physical Therapy

## 2023-04-18 ENCOUNTER — Ambulatory Visit (INDEPENDENT_AMBULATORY_CARE_PROVIDER_SITE_OTHER): Payer: BC Managed Care – PPO | Admitting: Physical Therapy

## 2023-04-18 DIAGNOSIS — M79662 Pain in left lower leg: Secondary | ICD-10-CM | POA: Diagnosis not present

## 2023-04-18 DIAGNOSIS — M6281 Muscle weakness (generalized): Secondary | ICD-10-CM | POA: Diagnosis not present

## 2023-04-18 DIAGNOSIS — R2689 Other abnormalities of gait and mobility: Secondary | ICD-10-CM

## 2023-04-18 NOTE — Therapy (Signed)
OUTPATIENT PHYSICAL THERAPY TREATMENT   Patient Name: Anthony Park MRN: [id number] DOB:Jun 16, 1961, 62 y.o., male Today's Date: 04/18/2023  END OF SESSION:  PT End of Session - 04/18/23 1111     Visit Number 6    Number of Visits 8    Date for PT Re-Evaluation 05/11/23    PT Start Time 1017    PT Stop Time 1105    PT Time Calculation (min) 48 min    Activity Tolerance Patient tolerated treatment well    Behavior During Therapy [facility] for tasks assessed/performed             Past Medical History:  Diagnosis Date   Arthritis    Diabetes mellitus without complication (HCC)    type 2 controlled with diet    Diabetic toe ulcer (HCC) 11/05/2018   History of kidney stones    passed 1 several years ago   Hyperlipidemia    Hypertension    not on medications   Peripheral vascular disease (HCC)    stent in left leg due to anerysm    Past Surgical History:  Procedure Laterality Date    Left Transmetatarsal Ampuation (Left Foot)  08/20/2020   ABDOMINAL AORTOGRAM W/LOWER EXTREMITY N/A 08/06/2020   Procedure: ABDOMINAL AORTOGRAM W/LOWER EXTREMITY;  Surgeon: [name], MD;  Location: [facility];  Service: Cardiovascular;  Laterality: N/A;   AMPUTATION Right 11/07/2018   Procedure: RIGHT 3RD TOE AMPUTATION;  Surgeon: Nadara Mustard, MD;  Location: [facility] OR;  Service: Orthopedics;  Laterality: Right;   AMPUTATION Left 08/20/2020   Procedure: Left Transmetatarsal Ampuation;  Surgeon: [name], MD;  Location: [facility] OR;  Service: Vascular;  Laterality: Left;   AMPUTATION Left 10/03/2020   Procedure: LEFT BELOW KNEE AMPUTATION;  Surgeon: [name], MD;  Location: [facility] OR;  Service: Vascular;  Laterality: Left;   AMPUTATION TOE Left    big toe and one next to it   CATARACT EXTRACTION Bilateral    EYE SURGERY Bilateral    cataracts removed   INSERTION OF ILIAC STENT Left    PERIPHERAL VASCULAR BALLOON ANGIOPLASTY Left 08/06/2020   Procedure: PERIPHERAL  VASCULAR BALLOON ANGIOPLASTY;  Surgeon: [name], MD;  Location: [facility];  Service: Cardiovascular;  Laterality: Left;  Peroneal artery.   TOE AMPUTATION Left 2015   TOTAL HIP ARTHROPLASTY Left 05/23/2020   Procedure: LEFT TOTAL HIP ARTHROPLASTY ANTERIOR APPROACH;  Surgeon: Kathryne Hitch, MD;  Location: [facility] ORS;  Service: Orthopedics;  Laterality: Left;   TOTAL HIP ARTHROPLASTY Right 07/31/2021   Procedure: RIGHT TOTAL HIP ARTHROPLASTY ANTERIOR APPROACH;  Surgeon: Kathryne Hitch, MD;  Location: [facility] ORS;  Service: Orthopedics;  Laterality: Right;   WOUND DEBRIDEMENT Left 09/11/2020   Procedure: DEBRIDEMENT OF LEFT TRANSMETATARSAL WOUND;  Surgeon: Leonie Douglas, MD;  Location: [facility] OR;  Service: Vascular;  Laterality: Left;   Patient Active Problem List   Diagnosis Date Noted   Hypogonadism, male 01/19/2023   Erectile dysfunction 01/19/2023   Aneurysm of abdominal vessel (HCC) 01/19/2023   Chronic kidney disease, stage 3a (HCC) 01/19/2023   AAA (abdominal aortic aneurysm) without rupture (HCC) 01/18/2023   S/P BKA (below knee amputation) unilateral, left (HCC) 10/03/2020   Non-healing wound of lower extremity 10/03/2020   History of amputation of left foot through metatarsal bone (HCC) 09/11/2020   PAD (peripheral artery disease) (HCC) 08/20/2020   Status post total replacement of left hip 06/05/2020   Unilateral primary osteoarthritis, right  hip 04/17/2020   Unilateral primary osteoarthritis, left hip 04/17/2020   Amputated toe, right (HCC) 11/22/2018   Type 2 diabetes mellitus with foot ulcer, without long-term current use of insulin (HCC) 11/22/2018   Mixed hyperlipidemia 11/22/2018   Aneurysm of left popliteal artery (HCC) 11/22/2018   Status post peripheral artery angioplasty with insertion of stent 11/22/2018   Osteomyelitis of third toe of right foot (HCC)    Cellulitis of third toe of right foot     PCP: Melida Quitter, MD   REFERRING  PROVIDER:   Adonis Huguenin, NP    REFERRING DIAG: [phone] (ICD-10-CM) - Below-knee amputation of left lower extremity, subsequent encounter (HCC)   THERAPY DIAG:  Pain in left lower leg  Muscle weakness (generalized)  Other abnormalities of gait and mobility  Rationale for Evaluation and Treatment: Rehabilitation  ONSET DATE: 6 week onset of left residual limb pain  SUBJECTIVE:   SUBJECTIVE STATEMENT: Relays he had some pain in left residual limb in one spot that "feels like a needle there but I know its not" He wants to continue with current treatment plan.   PERTINENT HISTORY: Rt THA (anterior approach) 07/31/21. Left TTTA, left THA (05/23/20), arthritis, DM2, HTN, HLD, PVD PAIN:  Are you having pain? Yes: NPRS scale: depends /10 Pain location: left distal and lateral residual limb at tibialis anterior and calf Pain description: fatigue, cramps, also reports phantom  Aggravating factors: wearing prosthesis too long sometimes, sometimes just has it at rest Relieving factors: taking off prosthesis  PRECAUTIONS: None  RED FLAGS: None   WEIGHT BEARING RESTRICTIONS: No  FALLS:  Has patient fallen in last 6 months? No   PATIENT GOALS: reduce pain in his leg  NEXT MD VISIT: nothing scheduled at eval  OBJECTIVE:   DIAGNOSTIC FINDINGS:   PATIENT SURVEYS:  FOTO not done, not a good category to place him in so will defer this  COGNITION: Overall cognitive status: Within functional limits for tasks assessed     SENSATION: Light touch: WFL   EDEMA:  Mild edema noted in distal legs bilat  PALPATION: Tender to palpation in left tibialis anterior and gastroc  LOWER EXTREMITY ROM:  Active ROM Right eval Left eval  Hip flexion    Hip extension    Hip abduction    Hip adduction    Hip internal rotation    Hip external rotation    Knee flexion    Knee extension    Ankle dorsiflexion    Ankle plantarflexion    Ankle inversion    Ankle eversion      (Blank rows = not tested)  LOWER EXTREMITY MMT:  MMT Right eval Left eval  Hip flexion  WFL  Hip extension    Hip abduction    Hip adduction    Hip internal rotation    Hip external rotation    Knee flexion  WFL  Knee extension  WNL  Ankle dorsiflexion    Ankle plantarflexion    Ankle inversion    Ankle eversion     (Blank rows = not tested)    FUNCTIONAL TESTS:    GAIT: Comments: WFL using prosthesis on left, he is independent community Ambulator but reports fatigue at times    TODAY'S TREATMENT:  04/18/23  Manual therapy for STM with cupping to left residual limb below knee as well as active compression and skilled palpation and Trigger Point Dry-Needling  Treatment instructions: Expect mild to moderate muscle soreness. Patient Consent Given: Yes  Education handout provided: verbally provided Muscles treated: Left tibialis anterior and gastroc Electrical stimulation provided: Yes milliamp current at frequency 80, with intensity as tolerated 2-4 Treatment response/outcome: good overall tolerance,twitch response noted   04/14/23  Manual therapy for STM with cupping to left residual limb below knee as well as active compression and skilled palpation and Trigger Point Dry-Needling  Treatment instructions: Expect mild to moderate muscle soreness. Patient Consent Given: Yes Education handout provided: verbally provided Muscles treated: Left tibialis anterior and gastroc Electrical stimulation provided: Yes milliamp current at frequency 50, with intensity as tolerated 2-4 Treatment response/outcome: good overall tolerance,twitch response noted   04/11/23 Self care education provided including contrast therapy of hot/cold treatment 3 min warm and 1 min cold for 3 cycles and to finish with warm. This was performed in clinic today with hot and cold packs wrapped around distal residual limb  Manual therapy for active compression and skilled palpation and Trigger Point  Dry-Needling  Treatment instructions: Expect mild to moderate muscle soreness. Patient Consent Given: Yes Education handout provided: verbally provided Muscles treated: Left tibialis anterior and gastroc Electrical stimulation provided: Yes milliamp current at frequency 30-50, with intensity as tolerated 2-4 Treatment response/outcome: good overall tolerance,twitch response noted   04/07/23 Self care education provided including contrast therapy of hot/cold treatment 3 min warm and 1 min cold for 3 cycles and to finish with warm. This was performed in clinic today with hot and cold packs wrapped around distal residual limb  Manual therapy for light to high pressure with cupping therapy to left residual limb over tibialis anterior and gastroc.  Manual therapy for active compression and skilled palpation and Trigger Point Dry-Needling  Treatment instructions: Expect mild to moderate muscle soreness. Patient Consent Given: Yes Education handout provided: verbally provided Muscles treated: Left tibialis anterior and gastroc Electrical stimulation provided: Yes milliamp current at frequency 80, with intensity as tolerated 2-4 Treatment response/outcome: good overall tolerance,twitch response noted     PATIENT EDUCATION: Education details: HEP, PT plan of care, self care education provided for phantom sensations, see above for details.  Person educated: Patient Education method: Explanation, Demonstration, Verbal cues, and Handouts Education comprehension: verbalized understanding and needs further education   HOME EXERCISE PROGRAM:   ASSESSMENT:  CLINICAL IMPRESSION: We continued with same nerve desensitization techniques and he had good tolerance to this. He will miss next week due to vacation and we will pick back up the week after that.   OBJECTIVE IMPAIRMENTS: decreased activity tolerance, difficulty walking, decreased balance, decreased endurance, decreased mobility, decreased  strength, LE use, postural dysfunction, and pain.  ACTIVITY LIMITATIONS: bending, lifting, carry, locomotion, cleaning, community activity, driving  PERSONAL FACTORS: Rt THA (anterior approach) 07/31/21. Left TTTA, left THA (05/23/20), arthritis, DM2, HTN, HLD, PVD are also affecting patient's functional outcome.  REHAB POTENTIAL: Good  CLINICAL DECISION MAKING: Stable/uncomplicated  EVALUATION COMPLEXITY: Low    GOALS: Short term PT Goals Target date: 04/27/2023   Pt will be I and compliant with self care recommendations and name 3 ways to manage phantom sensation/pain.  Baseline:  Goal status: New Pt will decrease pain by 25% overall Baseline: Goal status: New  Long term PT goals Target date:05/11/2023  Pt will reduce pain and phantom sensations by overall 50% Baseline: Goal status: New Pt will be able to ambulate community distances at least 400 ft WFL gait pattern with less complaints of residual limb pain. Baseline: Goal status: New  PLAN: PT FREQUENCY: 1-2 times per week   PT  DURATION: 4-6 weeks  PLANNED INTERVENTIONS (unless contraindicated): aquatic PT, prosthetic training, Canalith repositioning, cryotherapy, Electrical stimulation, Iontophoresis with 4 mg/ml dexamethasome, Moist heat, traction, Ultrasound, gait training, Therapeutic exercise, balance training, neuromuscular re-education, patient/family education, prosthetic training, manual techniques, passive ROM, dry needling, taping, vasopnuematic device, vestibular, spinal manipulations, joint manipulations  PLAN FOR NEXT SESSION: continue with nerve desensitization techniques.     April Manson, PT,DPT 04/18/2023, 11:11 AM

## 2023-05-03 ENCOUNTER — Ambulatory Visit (INDEPENDENT_AMBULATORY_CARE_PROVIDER_SITE_OTHER): Payer: BC Managed Care – PPO | Admitting: Physical Therapy

## 2023-05-03 ENCOUNTER — Encounter: Payer: Self-pay | Admitting: Physical Therapy

## 2023-05-03 DIAGNOSIS — M79662 Pain in left lower leg: Secondary | ICD-10-CM

## 2023-05-03 DIAGNOSIS — R2689 Other abnormalities of gait and mobility: Secondary | ICD-10-CM

## 2023-05-03 DIAGNOSIS — M6281 Muscle weakness (generalized): Secondary | ICD-10-CM | POA: Diagnosis not present

## 2023-05-03 NOTE — Therapy (Signed)
OUTPATIENT PHYSICAL THERAPY TREATMENT   Patient Name: Anthony Park MRN: [id number] DOB:[birth year], 62 y.o., male Today's Date: 05/03/2023  END OF SESSION:  PT End of Session - 05/03/23 1009     Visit Number 7    Number of Visits 8    Date for PT Re-Evaluation 05/11/23    PT Start Time 0930    PT Stop Time 1015    PT Time Calculation (min) 45 min    Activity Tolerance Patient tolerated treatment well    Behavior During Therapy [facility] for tasks assessed/performed             Past Medical History:  Diagnosis Date   Arthritis    Diabetes mellitus without complication (HCC)    type 2 controlled with diet    Diabetic toe ulcer (HCC) 11/05/2018   History of kidney stones    passed 1 several years ago   Hyperlipidemia    Hypertension    not on medications   Peripheral vascular disease (HCC)    stent in left leg due to anerysm    Past Surgical History:  Procedure Laterality Date    Left Transmetatarsal Ampuation (Left Foot)  08/20/2020   ABDOMINAL AORTOGRAM W/LOWER EXTREMITY N/A 08/06/2020   Procedure: ABDOMINAL AORTOGRAM W/LOWER EXTREMITY;  Surgeon: [name], MD;  Location: [facility];  Service: Cardiovascular;  Laterality: N/A;   AMPUTATION Right 11/07/2018   Procedure: RIGHT 3RD TOE AMPUTATION;  Surgeon: Nadara Mustard, MD;  Location: [facility] OR;  Service: Orthopedics;  Laterality: Right;   AMPUTATION Left 08/20/2020   Procedure: Left Transmetatarsal Ampuation;  Surgeon: [name], MD;  Location: [facility] OR;  Service: Vascular;  Laterality: Left;   AMPUTATION Left 10/03/2020   Procedure: LEFT BELOW KNEE AMPUTATION;  Surgeon: [name], MD;  Location: [facility] OR;  Service: Vascular;  Laterality: Left;   AMPUTATION TOE Left    big toe and one next to it   CATARACT EXTRACTION Bilateral    EYE SURGERY Bilateral    cataracts removed   INSERTION OF ILIAC STENT Left    PERIPHERAL VASCULAR BALLOON ANGIOPLASTY Left 08/06/2020   Procedure: PERIPHERAL  VASCULAR BALLOON ANGIOPLASTY;  Surgeon: [name], MD;  Location: [facility];  Service: Cardiovascular;  Laterality: Left;  Peroneal artery.   TOE AMPUTATION Left 2015   TOTAL HIP ARTHROPLASTY Left 05/23/2020   Procedure: LEFT TOTAL HIP ARTHROPLASTY ANTERIOR APPROACH;  Surgeon: Kathryne Hitch, MD;  Location: [facility] ORS;  Service: Orthopedics;  Laterality: Left;   TOTAL HIP ARTHROPLASTY Right 07/31/2021   Procedure: RIGHT TOTAL HIP ARTHROPLASTY ANTERIOR APPROACH;  Surgeon: Kathryne Hitch, MD;  Location: [facility] OR[facility];  Service: Orthopedics;  Laterality: Right;   WOUND DEBRIDEMENT Left 09/11/2020   Procedure: DEBRIDEMENT OF LEFT TRANSMETATARSAL WOUND;  Surgeon: Leonie Douglas, MD;  Location: [facility] OR;  Service: Vascular;  Laterality: Left;   Patient Active Problem List   Diagnosis Date Noted   Hypogonadism, male 01/19/2023   Erectile dysfunction 01/19/2023   Aneurysm of abdominal vessel (HCC) 01/19/2023   Chronic kidney disease, stage 3a (HCC) 01/19/2023   AAA (abdominal aortic aneurysm) without rupture (HCC) 01/18/2023   S/P BKA (below knee amputation) unilateral, left (HCC) 10/03/2020   Non-healing wound of lower extremity 10/03/2020   History of amputation of left foot through metatarsal bone (HCC) 09/11/2020   PAD (peripheral artery disease) (HCC) 08/20/2020   Status post total replacement of left hip 06/05/2020   Unilateral primary osteoarthritis, right  hip 04/17/2020   Unilateral primary osteoarthritis, left hip 04/17/2020   Amputated toe, right (HCC) 11/22/2018   Type 2 diabetes mellitus with foot ulcer, without long-term current use of insulin (HCC) 11/22/2018   Mixed hyperlipidemia 11/22/2018   Aneurysm of left popliteal artery (HCC) 11/22/2018   Status post peripheral artery angioplasty with insertion of stent 11/22/2018   Osteomyelitis of third toe of right foot (HCC)    Cellulitis of third toe of right foot     PCP: Melida Quitter, MD   REFERRING  PROVIDER:   Adonis Huguenin, NP    REFERRING DIAG: [phone] (ICD-10-CM) - Below-knee amputation of left lower extremity, subsequent encounter (HCC)   THERAPY DIAG:  Pain in left lower leg  Muscle weakness (generalized)  Other abnormalities of gait and mobility  Rationale for Evaluation and Treatment: Rehabilitation  ONSET DATE: 6 week onset of left residual limb pain  SUBJECTIVE:   SUBJECTIVE STATEMENT: Relays he can tell improvements overall in his pain and phantom sensations but still present at times. He does endorse that he tripped and did not fall but jarred his Rt hip which has been sore and painful.  PERTINENT HISTORY: Rt THA (anterior approach) 07/31/21. Left TTTA, left THA (05/23/20), arthritis, DM2, HTN, HLD, PVD PAIN:  Are you having pain? Yes: NPRS scale: Rt hip is about 5/10 Pain location: left distal and lateral residual limb at tibialis anterior and calf Pain description: fatigue, cramps, also reports phantom  Aggravating factors: wearing prosthesis too long sometimes, sometimes just has it at rest Relieving factors: taking off prosthesis  PRECAUTIONS: None  RED FLAGS: None   WEIGHT BEARING RESTRICTIONS: No  FALLS:  Has patient fallen in last 6 months? No   PATIENT GOALS: reduce pain in his leg  NEXT MD VISIT: nothing scheduled at eval  OBJECTIVE:   DIAGNOSTIC FINDINGS:   PATIENT SURVEYS:  FOTO not done, not a good category to place him in so will defer this  COGNITION: Overall cognitive status: Within functional limits for tasks assessed     SENSATION: Light touch: WFL   EDEMA:  Mild edema noted in distal legs bilat  PALPATION: Tender to palpation in left tibialis anterior and gastroc  LOWER EXTREMITY ROM:  Active ROM Left 05/03/23   Hip flexion    Hip extension    Hip abduction    Hip adduction    Hip internal rotation    Hip external rotation    Knee flexion WFL   Knee extension WFL   Ankle dorsiflexion    Ankle  plantarflexion    Ankle inversion    Ankle eversion     (Blank rows = not tested)  LOWER EXTREMITY MMT:  MMT Right eval Left eval  Hip flexion  WFL  Hip extension    Hip abduction    Hip adduction    Hip internal rotation    Hip external rotation    Knee flexion  WFL  Knee extension  WNL  Ankle dorsiflexion    Ankle plantarflexion    Ankle inversion    Ankle eversion     (Blank rows = not tested)    FUNCTIONAL TESTS:    GAIT: Comments: WFL using prosthesis on left, he is independent community Ambulator but reports fatigue at times    TODAY'S TREATMENT:  04/18/23 Manual therapy for IASTM with graston tool as well as STM with hands to residual limb below knee as well as active compression and skilled palpation and Trigger Point Dry-Needling  Treatment instructions: Expect mild to moderate muscle soreness. Patient Consent Given: Yes Education handout provided: verbally provided Muscles treated: Left tibialis anterior and gastroc Electrical stimulation provided: Yes milliamp current at frequency 80, with intensity as tolerated 2-4 Treatment response/outcome: good overall tolerance,twitch response noted   04/14/23  Manual therapy for STM with cupping to left residual limb below knee as well as active compression and skilled palpation and Trigger Point Dry-Needling  Treatment instructions: Expect mild to moderate muscle soreness. Patient Consent Given: Yes Education handout provided: verbally provided Muscles treated: Left tibialis anterior and gastroc Electrical stimulation provided: Yes milliamp current at frequency 50, with intensity as tolerated 2-4 Treatment response/outcome: good overall tolerance,twitch response noted   04/11/23 Self care education provided including contrast therapy of hot/cold treatment 3 min warm and 1 min cold for 3 cycles and to finish with warm. This was performed in clinic today with hot and cold packs wrapped around distal residual  limb  Manual therapy for active compression and skilled palpation and Trigger Point Dry-Needling  Treatment instructions: Expect mild to moderate muscle soreness. Patient Consent Given: Yes Education handout provided: verbally provided Muscles treated: Left tibialis anterior and gastroc Electrical stimulation provided: Yes milliamp current at frequency 30-50, with intensity as tolerated 2-4 Treatment response/outcome: good overall tolerance,twitch response noted   04/07/23 Self care education provided including contrast therapy of hot/cold treatment 3 min warm and 1 min cold for 3 cycles and to finish with warm. This was performed in clinic today with hot and cold packs wrapped around distal residual limb  Manual therapy for light to high pressure with cupping therapy to left residual limb over tibialis anterior and gastroc.  Manual therapy for active compression and skilled palpation and Trigger Point Dry-Needling  Treatment instructions: Expect mild to moderate muscle soreness. Patient Consent Given: Yes Education handout provided: verbally provided Muscles treated: Left tibialis anterior and gastroc Electrical stimulation provided: Yes milliamp current at frequency 80, with intensity as tolerated 2-4 Treatment response/outcome: good overall tolerance,twitch response noted     PATIENT EDUCATION: Education details: HEP, PT plan of care, self care education provided for phantom sensations, see above for details.  Person educated: Patient Education method: Programmer, multimedia, Demonstration, Verbal cues, and Handouts Education comprehension: verbalized understanding and needs further education   HOME EXERCISE PROGRAM:   ASSESSMENT:  CLINICAL IMPRESSION: He does appear to be improving with his overall left residual limb pain and phantom sensations so we continued with same treatment but I did add in Instrument assisted soft tissue mobilization work today with [employer]. We also discussed  following up with MD to examine his Rt hip if the pain persists that happened after he tripped and jarred his hip.   OBJECTIVE IMPAIRMENTS: decreased activity tolerance, difficulty walking, decreased balance, decreased endurance, decreased mobility, decreased strength, LE use, postural dysfunction, and pain.  ACTIVITY LIMITATIONS: bending, lifting, carry, locomotion, cleaning, community activity, driving  PERSONAL FACTORS: Rt THA (anterior approach) 07/31/21. Left TTTA, left THA (05/23/20), arthritis, DM2, HTN, HLD, PVD are also affecting patient's functional outcome.  REHAB POTENTIAL: Good  CLINICAL DECISION MAKING: Stable/uncomplicated  EVALUATION COMPLEXITY: Low    GOALS: Short term PT Goals Target date: 04/27/2023   Pt will be I and compliant with self care recommendations and name 3 ways to manage phantom sensation/pain.  Baseline:  Goal status: New Pt will decrease pain by 25% overall Baseline: Goal status: New  Long term PT goals Target date:05/11/2023  Pt will reduce pain and phantom sensations by overall  50% Baseline: Goal status: New Pt will be able to ambulate community distances at least 400 ft [facility] gait pattern with less complaints of residual limb pain. Baseline: Goal status: New  PLAN: PT FREQUENCY: 1-2 times per week   PT DURATION: 4-6 weeks  PLANNED INTERVENTIONS (unless contraindicated): aquatic PT, prosthetic training, Canalith repositioning, cryotherapy, Electrical stimulation, Iontophoresis with 4 mg/ml dexamethasome, Moist heat, traction, Ultrasound, gait training, Therapeutic exercise, balance training, neuromuscular re-education, patient/family education, prosthetic training, manual techniques, passive ROM, dry needling, taping, vasopnuematic device, vestibular, spinal manipulations, joint manipulations  PLAN FOR NEXT SESSION: continue with nerve desensitization techniques.     April Manson, PT,DPT 05/03/2023, 10:10 AM

## 2023-05-05 ENCOUNTER — Ambulatory Visit (INDEPENDENT_AMBULATORY_CARE_PROVIDER_SITE_OTHER): Payer: BC Managed Care – PPO | Admitting: Orthopaedic Surgery

## 2023-05-05 ENCOUNTER — Encounter: Payer: Self-pay | Admitting: Orthopaedic Surgery

## 2023-05-05 ENCOUNTER — Encounter: Payer: Self-pay | Admitting: Physical Therapy

## 2023-05-05 ENCOUNTER — Other Ambulatory Visit (INDEPENDENT_AMBULATORY_CARE_PROVIDER_SITE_OTHER): Payer: BC Managed Care – PPO

## 2023-05-05 ENCOUNTER — Ambulatory Visit (INDEPENDENT_AMBULATORY_CARE_PROVIDER_SITE_OTHER): Payer: BC Managed Care – PPO | Admitting: Physical Therapy

## 2023-05-05 DIAGNOSIS — M79662 Pain in left lower leg: Secondary | ICD-10-CM | POA: Diagnosis not present

## 2023-05-05 DIAGNOSIS — Z96641 Presence of right artificial hip joint: Secondary | ICD-10-CM | POA: Diagnosis not present

## 2023-05-05 DIAGNOSIS — M6281 Muscle weakness (generalized): Secondary | ICD-10-CM

## 2023-05-05 DIAGNOSIS — R2689 Other abnormalities of gait and mobility: Secondary | ICD-10-CM

## 2023-05-05 NOTE — Progress Notes (Signed)
HPI: [name] comes in today status post right partial trip last Friday over tailgate.  His prosthetic foot caught on the tailgate of a trailer but he did not actually fall.  He feels he jarred his right hip.  He comes in today wanting his hips checked out since he has had bilateral total hip arthroplasties.  He states he is having a sharp pain lateral aspect of the right hip only.  No groin pain.  Denies any numbness tingling down the leg.  No other injuries. He is on chronic Neurontin and Robaxin and tramadol.  He is taking no new medications for this right hip pain.  Review of systems: See HPI otherwise negative.  Physical exam: General Well-developed well-nourished male no acute distress mood affect appropriate.  Ambulates without any assistive device.  Bilateral hips: Good range of motion of both hips without significant pain.  Fluid motion.  Tenderness over the right hip trochanteric region only.  Radiographs: AP pelvis and lateral view of the right hip: Status post bilateral total hip arthroplasties without any complicating features.  Right hip appears well-seated.  Arthrosclerosis noted of the femoral arteries.  Impression: Right hip contusion   Plan: Questions encouraged and answered by Dr. Vedia Coffer.  He will follow-up with [place] as needed.  He is already going to formal physical therapy for phantom pain in his left leg and receiving dry needling recommend dry needling over the right hip trochanteric region.

## 2023-05-05 NOTE — Therapy (Signed)
OUTPATIENT PHYSICAL THERAPY TREATMENT   Patient Name: Anthony Park MRN: [id number] DOB:1961/04/20, 62 y.o., male Today's Date: 05/05/2023  END OF SESSION:  PT End of Session - 05/05/23 0923     Visit Number 8    Number of Visits 8    Date for PT Re-Evaluation 05/11/23    PT Start Time 0845    PT Stop Time 0930    PT Time Calculation (min) 45 min    Activity Tolerance Patient tolerated treatment well    Behavior During Therapy [facility] for tasks assessed/performed             Past Medical History:  Diagnosis Date   Arthritis    Diabetes mellitus without complication (HCC)    type 2 controlled with diet    Diabetic toe ulcer (HCC) 11/05/2018   History of kidney stones    passed 1 several years ago   Hyperlipidemia    Hypertension    not on medications   Peripheral vascular disease (HCC)    stent in left leg due to anerysm    Past Surgical History:  Procedure Laterality Date    Left Transmetatarsal Ampuation (Left Foot)  08/20/2020   ABDOMINAL AORTOGRAM W/LOWER EXTREMITY N/A 08/06/2020   Procedure: ABDOMINAL AORTOGRAM W/LOWER EXTREMITY;  Surgeon: [name], MD;  Location: [facility];  Service: Cardiovascular;  Laterality: N/A;   AMPUTATION Right 11/07/2018   Procedure: RIGHT 3RD TOE AMPUTATION;  Surgeon: Nadara Mustard, MD;  Location: [facility] OR;  Service: Orthopedics;  Laterality: Right;   AMPUTATION Left 08/20/2020   Procedure: Left Transmetatarsal Ampuation;  Surgeon: [name], MD;  Location: [facility] OR;  Service: Vascular;  Laterality: Left;   AMPUTATION Left 10/03/2020   Procedure: LEFT BELOW KNEE AMPUTATION;  Surgeon: [name], MD;  Location: [facility] OR;  Service: Vascular;  Laterality: Left;   AMPUTATION TOE Left    big toe and one next to it   CATARACT EXTRACTION Bilateral    EYE SURGERY Bilateral    cataracts removed   INSERTION OF ILIAC STENT Left    PERIPHERAL VASCULAR BALLOON ANGIOPLASTY Left 08/06/2020   Procedure: PERIPHERAL  VASCULAR BALLOON ANGIOPLASTY;  Surgeon: [name], MD;  Location: [facility];  Service: Cardiovascular;  Laterality: Left;  Peroneal artery.   TOE AMPUTATION Left 2015   TOTAL HIP ARTHROPLASTY Left 05/23/2020   Procedure: LEFT TOTAL HIP ARTHROPLASTY ANTERIOR APPROACH;  Surgeon: Kathryne Hitch, MD;  Location: [facility];  Service: Orthopedics;  Laterality: Left;   TOTAL HIP ARTHROPLASTY Right 07/31/2021   Procedure: RIGHT TOTAL HIP ARTHROPLASTY ANTERIOR APPROACH;  Surgeon: Kathryne Hitch, MD;  Location: [facility];  Service: Orthopedics;  Laterality: Right;   WOUND DEBRIDEMENT Left 09/11/2020   Procedure: DEBRIDEMENT OF LEFT TRANSMETATARSAL WOUND;  Surgeon: Leonie Douglas, MD;  Location: [facility] OR;  Service: Vascular;  Laterality: Left;   Patient Active Problem List   Diagnosis Date Noted   Hypogonadism, male 01/19/2023   Erectile dysfunction 01/19/2023   Aneurysm of abdominal vessel (HCC) 01/19/2023   Chronic kidney disease, stage 3a (HCC) 01/19/2023   AAA (abdominal aortic aneurysm) without rupture (HCC) 01/18/2023   S/P BKA (below knee amputation) unilateral, left (HCC) 10/03/2020   Non-healing wound of lower extremity 10/03/2020   History of amputation of left foot through metatarsal bone (HCC) 09/11/2020   PAD (peripheral artery disease) (HCC) 08/20/2020   Status post total replacement of left hip 06/05/2020   Unilateral primary osteoarthritis, right  hip 04/17/2020   Unilateral primary osteoarthritis, left hip 04/17/2020   Amputated toe, right (HCC) 11/22/2018   Type 2 diabetes mellitus with foot ulcer, without long-term current use of insulin (HCC) 11/22/2018   Mixed hyperlipidemia 11/22/2018   Aneurysm of left popliteal artery (HCC) 11/22/2018   Status post peripheral artery angioplasty with insertion of stent 11/22/2018   Osteomyelitis of third toe of right foot (HCC)    Cellulitis of third toe of right foot     PCP: Melida Quitter, MD   REFERRING  PROVIDER:   Adonis Huguenin, NP    REFERRING DIAG: [phone] (ICD-10-CM) - Below-knee amputation of left lower extremity, subsequent encounter (HCC)   THERAPY DIAG:  Pain in left lower leg  Muscle weakness (generalized)  Other abnormalities of gait and mobility  Rationale for Evaluation and Treatment: Rehabilitation  ONSET DATE: 6 week onset of left residual limb pain  SUBJECTIVE:   SUBJECTIVE STATEMENT: Relays he can tell a difference since starting PT, he is having less pain in his residual limb, does still get some phantom sensations at time.   PERTINENT HISTORY: Rt THA (anterior approach) 07/31/21. Left TTTA, left THA (05/23/20), arthritis, DM2, HTN, HLD, PVD PAIN:  Are you having pain? Yes: NPRS scale: Rt hip is about 5/10 Pain location: left distal and lateral residual limb at tibialis anterior and calf Pain description: fatigue, cramps, also reports phantom  Aggravating factors: wearing prosthesis too long sometimes, sometimes just has it at rest Relieving factors: taking off prosthesis  PRECAUTIONS: None  RED FLAGS: None   WEIGHT BEARING RESTRICTIONS: No  FALLS:  Has patient fallen in last 6 months? No   PATIENT GOALS: reduce pain in his leg  NEXT MD VISIT: nothing scheduled at eval  OBJECTIVE:   DIAGNOSTIC FINDINGS:   PATIENT SURVEYS:  FOTO not done, not a good category to place him in so will defer this  COGNITION: Overall cognitive status: Within functional limits for tasks assessed     SENSATION: Light touch: WFL   EDEMA:  Mild edema noted in distal legs bilat  PALPATION: Tender to palpation in left tibialis anterior and gastroc  LOWER EXTREMITY ROM:  Active ROM Left 05/03/23   Hip flexion    Hip extension    Hip abduction    Hip adduction    Hip internal rotation    Hip external rotation    Knee flexion WFL   Knee extension WFL   Ankle dorsiflexion    Ankle plantarflexion    Ankle inversion    Ankle eversion     (Blank rows =  not tested)  LOWER EXTREMITY MMT:  MMT Right eval Left eval  Hip flexion  WFL  Hip extension    Hip abduction    Hip adduction    Hip internal rotation    Hip external rotation    Knee flexion  WFL  Knee extension  WNL  Ankle dorsiflexion    Ankle plantarflexion    Ankle inversion    Ankle eversion     (Blank rows = not tested)    FUNCTIONAL TESTS:    GAIT: Comments: WFL using prosthesis on left, he is independent community Ambulator but reports fatigue at times    TODAY'S TREATMENT:  05/05/23 Manual therapy for IASTM with graston tool as well as STM with hands to residual limb below knee as well as active compression and skilled palpation and Trigger Point Dry-Needling  Treatment instructions: Expect mild to moderate muscle soreness. Patient Consent  Given: Yes Education handout provided: verbally provided Muscles treated: Left tibialis anterior and gastroc Electrical stimulation provided: Yes milliamp current at frequency 80, with intensity as tolerated 2-4 Treatment response/outcome: good overall tolerance,twitch response noted     PATIENT EDUCATION: Education details: HEP, PT plan of care, self care education provided for phantom sensations, see above for details.  Person educated: Patient Education method: Explanation, Demonstration, Verbal cues, and Handouts Education comprehension: verbalized understanding and needs further education   HOME EXERCISE PROGRAM:   ASSESSMENT:  CLINICAL IMPRESSION: He continues to relay improvements in his overall pain and phantom sensations. Of note is having some pain in his opposite hip and will follow up with Dr. Magnus Ivan about this today.   OBJECTIVE IMPAIRMENTS: decreased activity tolerance, difficulty walking, decreased balance, decreased endurance, decreased mobility, decreased strength, LE use, postural dysfunction, and pain.  ACTIVITY LIMITATIONS: bending, lifting, carry, locomotion, cleaning, community activity,  driving  PERSONAL FACTORS: Rt THA (anterior approach) 07/31/21. Left TTTA, left THA (05/23/20), arthritis, DM2, HTN, HLD, PVD are also affecting patient's functional outcome.  REHAB POTENTIAL: Good  CLINICAL DECISION MAKING: Stable/uncomplicated  EVALUATION COMPLEXITY: Low    GOALS: Short term PT Goals Target date: 04/27/2023   Pt will be I and compliant with self care recommendations and name 3 ways to manage phantom sensation/pain.  Baseline:  Goal status: New Pt will decrease pain by 25% overall Baseline: Goal status: New  Long term PT goals Target date:05/11/2023  Pt will reduce pain and phantom sensations by overall 50% Baseline: Goal status: New Pt will be able to ambulate community distances at least 400 ft WFL gait pattern with less complaints of residual limb pain. Baseline: Goal status: New  PLAN: PT FREQUENCY: 1-2 times per week   PT DURATION: 4-6 weeks  PLANNED INTERVENTIONS (unless contraindicated): aquatic PT, prosthetic training, Canalith repositioning, cryotherapy, Electrical stimulation, Iontophoresis with 4 mg/ml dexamethasome, Moist heat, traction, Ultrasound, gait training, Therapeutic exercise, balance training, neuromuscular re-education, patient/family education, prosthetic training, manual techniques, passive ROM, dry needling, taping, vasopnuematic device, vestibular, spinal manipulations, joint manipulations  PLAN FOR NEXT SESSION: will need PT recert next visit. continue with nerve desensitization techniques.     April Manson, PT,DPT 05/05/2023, 9:25 AM

## 2023-05-09 ENCOUNTER — Ambulatory Visit (INDEPENDENT_AMBULATORY_CARE_PROVIDER_SITE_OTHER): Payer: BC Managed Care – PPO | Admitting: Physical Therapy

## 2023-05-09 ENCOUNTER — Encounter: Payer: Self-pay | Admitting: Physical Therapy

## 2023-05-09 DIAGNOSIS — R2689 Other abnormalities of gait and mobility: Secondary | ICD-10-CM | POA: Diagnosis not present

## 2023-05-09 DIAGNOSIS — M25551 Pain in right hip: Secondary | ICD-10-CM

## 2023-05-09 DIAGNOSIS — M6281 Muscle weakness (generalized): Secondary | ICD-10-CM

## 2023-05-09 DIAGNOSIS — M79662 Pain in left lower leg: Secondary | ICD-10-CM | POA: Diagnosis not present

## 2023-05-09 NOTE — Therapy (Signed)
OUTPATIENT PHYSICAL THERAPY TREATMENT/RECERT   Patient Name: Anthony Park MRN: [id number] DOB:11-08-1960, 62 y.o., male Today's Date: 05/09/2023  END OF SESSION:  PT End of Session - 05/09/23 0917     Visit Number 9    Number of Visits 20    Date for PT Re-Evaluation 07/11/23    PT Start Time 0850    PT Stop Time 0930    PT Time Calculation (min) 40 min    Activity Tolerance Patient tolerated treatment well    Behavior During Therapy [facility] for tasks assessed/performed             Past Medical History:  Diagnosis Date   Arthritis    Diabetes mellitus without complication (HCC)    type 2 controlled with diet    Diabetic toe ulcer (HCC) 11/05/2018   History of kidney stones    passed 1 several years ago   Hyperlipidemia    Hypertension    not on medications   Peripheral vascular disease (HCC)    stent in left leg due to anerysm    Past Surgical History:  Procedure Laterality Date    Left Transmetatarsal Ampuation (Left Foot)  08/20/2020   ABDOMINAL AORTOGRAM W/LOWER EXTREMITY N/A 08/06/2020   Procedure: ABDOMINAL AORTOGRAM W/LOWER EXTREMITY;  Surgeon: [name], MD;  Location: [facility];  Service: Cardiovascular;  Laterality: N/A;   AMPUTATION Right 11/07/2018   Procedure: RIGHT 3RD TOE AMPUTATION;  Surgeon: Nadara Mustard, MD;  Location: [facility] OR;  Service: Orthopedics;  Laterality: Right;   AMPUTATION Left 08/20/2020   Procedure: Left Transmetatarsal Ampuation;  Surgeon: [name], MD;  Location: [facility] OR;  Service: Vascular;  Laterality: Left;   AMPUTATION Left 10/03/2020   Procedure: LEFT BELOW KNEE AMPUTATION;  Surgeon: [name], MD;  Location: [facility] OR;  Service: Vascular;  Laterality: Left;   AMPUTATION TOE Left    big toe and one next to it   CATARACT EXTRACTION Bilateral    EYE SURGERY Bilateral    cataracts removed   INSERTION OF ILIAC STENT Left    PERIPHERAL VASCULAR BALLOON ANGIOPLASTY Left 08/06/2020   Procedure:  PERIPHERAL VASCULAR BALLOON ANGIOPLASTY;  Surgeon: [name], MD;  Location: [facility];  Service: Cardiovascular;  Laterality: Left;  Peroneal artery.   TOE AMPUTATION Left 2015   TOTAL HIP ARTHROPLASTY Left 05/23/2020   Procedure: LEFT TOTAL HIP ARTHROPLASTY ANTERIOR APPROACH;  Surgeon: Kathryne Hitch, MD;  Location: [facility];  Service: Orthopedics;  Laterality: Left;   TOTAL HIP ARTHROPLASTY Right 07/31/2021   Procedure: RIGHT TOTAL HIP ARTHROPLASTY ANTERIOR APPROACH;  Surgeon: Kathryne Hitch, MD;  Location: [facility];  Service: Orthopedics;  Laterality: Right;   WOUND DEBRIDEMENT Left 09/11/2020   Procedure: DEBRIDEMENT OF LEFT TRANSMETATARSAL WOUND;  Surgeon: Leonie Douglas, MD;  Location: [facility] OR;  Service: Vascular;  Laterality: Left;   Patient Active Problem List   Diagnosis Date Noted   Hypogonadism, male 01/19/2023   Erectile dysfunction 01/19/2023   Aneurysm of abdominal vessel (HCC) 01/19/2023   Chronic kidney disease, stage 3a (HCC) 01/19/2023   AAA (abdominal aortic aneurysm) without rupture (HCC) 01/18/2023   S/P BKA (below knee amputation) unilateral, left (HCC) 10/03/2020   Non-healing wound of lower extremity 10/03/2020   History of amputation of left foot through metatarsal bone (HCC) 09/11/2020   PAD (peripheral artery disease) (HCC) 08/20/2020   Status post total replacement of left hip 06/05/2020   Unilateral primary osteoarthritis, right  hip 04/17/2020   Unilateral primary osteoarthritis, left hip 04/17/2020   Amputated toe, right (HCC) 11/22/2018   Type 2 diabetes mellitus with foot ulcer, without long-term current use of insulin (HCC) 11/22/2018   Mixed hyperlipidemia 11/22/2018   Aneurysm of left popliteal artery (HCC) 11/22/2018   Status post peripheral artery angioplasty with insertion of stent 11/22/2018   Osteomyelitis of third toe of right foot (HCC)    Cellulitis of third toe of right foot     PCP: Melida Quitter, MD    REFERRING PROVIDER:   Adonis Huguenin, NP    REFERRING DIAG: [phone] (ICD-10-CM) - Below-knee amputation of left lower extremity, subsequent encounter (HCC)   THERAPY DIAG:  Pain in left lower leg  Muscle weakness (generalized)  Other abnormalities of gait and mobility  Pain in right hip  Rationale for Evaluation and Treatment: Rehabilitation  ONSET DATE: 6 week onset of left residual limb pain  SUBJECTIVE:   SUBJECTIVE STATEMENT: Relays he can tell a difference since starting PT, he is having less pain in his residual limb, does still get some phantom sensations at time. He feels this has improved 40% since starting PT  PERTINENT HISTORY: Rt THA (anterior approach) 07/31/21. Left TTTA, left THA (05/23/20), arthritis, DM2, HTN, HLD, PVD PAIN:  Are you having pain? Yes: NPRS scale: Rt hip is about 5/10 Pain location: left distal and lateral residual limb at tibialis anterior and calf Pain description: fatigue, cramps, also reports phantom  Aggravating factors: wearing prosthesis too long sometimes, sometimes just has it at rest Relieving factors: taking off prosthesis  PRECAUTIONS: None  RED FLAGS: None   WEIGHT BEARING RESTRICTIONS: No  FALLS:  Has patient fallen in last 6 months? No   PATIENT GOALS: reduce pain in his leg  NEXT MD VISIT: nothing scheduled at eval  OBJECTIVE:   DIAGNOSTIC FINDINGS:   PATIENT SURVEYS:  FOTO not done, not a good category to place him in so will defer this  COGNITION: Overall cognitive status: Within functional limits for tasks assessed     SENSATION: Light touch: WFL   EDEMA:  Mild edema noted in distal legs bilat  PALPATION: Tender to palpation in left tibialis anterior and gastroc  LOWER EXTREMITY ROM:  Active ROM Left 05/03/23   Hip flexion    Hip extension    Hip abduction    Hip adduction    Hip internal rotation    Hip external rotation    Knee flexion WFL   Knee extension WFL   Ankle dorsiflexion     Ankle plantarflexion    Ankle inversion    Ankle eversion     (Blank rows = not tested)  LOWER EXTREMITY MMT:  MMT Right eval Left eval  Hip flexion  WFL  Hip extension    Hip abduction    Hip adduction    Hip internal rotation    Hip external rotation    Knee flexion  WFL  Knee extension  WNL  Ankle dorsiflexion    Ankle plantarflexion    Ankle inversion    Ankle eversion     (Blank rows = not tested)    FUNCTIONAL TESTS:    GAIT: Comments: WFL using prosthesis on left, he is independent community Ambulator but reports fatigue at times    TODAY'S TREATMENT:  05/09/23 Manual therapy for IASTM with graston tool as well as STM with hands to residual limb below knee as well as active compression and skilled palpation and Trigger  Point Dry-Needling  Treatment instructions: Expect mild to moderate muscle soreness. Patient Consent Given: Yes Education handout provided: verbally provided Muscles treated: Left tibialis anterior combined with Estim as well as Rt glutes and TFL/ITB without Estim  Electrical stimulation provided: Yes milliamp current at frequency 80, with intensity as tolerated 2-4 Treatment response/outcome: good overall tolerance,twitch response noted     PATIENT EDUCATION: Education details: HEP, PT plan of care, self care education provided for phantom sensations, see above for details.  Person educated: Patient Education method: Explanation, Demonstration, Verbal cues, and Handouts Education comprehension: verbalized understanding and needs further education   HOME EXERCISE PROGRAM:   ASSESSMENT:  CLINICAL IMPRESSION: He has had overall about 40% improvement in his pain and phantom sensations since starting PT. He also endorses he can stand and walk longer periods now for ADL's. His initial PT plan has expired and I do recommend extending his PT as he has not yet met long term PT goals. He has met short term goals now. Dr. Magnus Ivan is also  recommending DN and PT for his Rt hip as well so we added this in today.   OBJECTIVE IMPAIRMENTS: decreased activity tolerance, difficulty walking, decreased balance, decreased endurance, decreased mobility, decreased strength, LE use, postural dysfunction, and pain.  ACTIVITY LIMITATIONS: bending, lifting, carry, locomotion, cleaning, community activity, driving  PERSONAL FACTORS: Rt THA (anterior approach) 07/31/21. Left TTTA, left THA (05/23/20), arthritis, DM2, HTN, HLD, PVD are also affecting patient's functional outcome.  REHAB POTENTIAL: Good  CLINICAL DECISION MAKING: Stable/uncomplicated  EVALUATION COMPLEXITY: Low    GOALS: Short term PT Goals Target date: 04/27/2023   Pt will be I and compliant with self care recommendations and name 3 ways to manage phantom sensation/pain.  Baseline:  Goal status: MET 05/09/23 Pt will decrease pain by 25% overall Baseline: Goal status: MET 05/09/23  Long term PT goals Target date:07/11/2023  Pt will reduce pain and phantom sensations by overall 50% Baseline: Goal status: ongoing,  Pt will be able to ambulate community distances at least 400 ft WFL gait pattern with less complaints of residual limb pain. Baseline: Goal status: ongoing 05/09/23  PLAN: PT FREQUENCY: 1-2 times per week   PT DURATION: 4-6 weeks  PLANNED INTERVENTIONS (unless contraindicated): aquatic PT, prosthetic training, Canalith repositioning, cryotherapy, Electrical stimulation, Iontophoresis with 4 mg/ml dexamethasome, Moist heat, traction, Ultrasound, gait training, Therapeutic exercise, balance training, neuromuscular re-education, patient/family education, prosthetic training, manual techniques, passive ROM, dry needling, taping, vasopnuematic device, vestibular, spinal manipulations, joint manipulations  PLAN FOR NEXT SESSION: continue with nerve desensitization techniques.     April Manson, PT,DPT 05/09/2023, 9:26 AM

## 2023-05-10 DIAGNOSIS — Z89512 Acquired absence of left leg below knee: Secondary | ICD-10-CM | POA: Diagnosis not present

## 2023-05-11 DIAGNOSIS — N1831 Chronic kidney disease, stage 3a: Secondary | ICD-10-CM | POA: Diagnosis not present

## 2023-05-11 DIAGNOSIS — I129 Hypertensive chronic kidney disease with stage 1 through stage 4 chronic kidney disease, or unspecified chronic kidney disease: Secondary | ICD-10-CM | POA: Diagnosis not present

## 2023-05-11 DIAGNOSIS — E782 Mixed hyperlipidemia: Secondary | ICD-10-CM | POA: Diagnosis not present

## 2023-05-11 DIAGNOSIS — R82998 Other abnormal findings in urine: Secondary | ICD-10-CM | POA: Diagnosis not present

## 2023-05-11 DIAGNOSIS — E1142 Type 2 diabetes mellitus with diabetic polyneuropathy: Secondary | ICD-10-CM | POA: Diagnosis not present

## 2023-05-12 ENCOUNTER — Encounter: Payer: Self-pay | Admitting: Physical Therapy

## 2023-05-12 ENCOUNTER — Ambulatory Visit (INDEPENDENT_AMBULATORY_CARE_PROVIDER_SITE_OTHER): Payer: BC Managed Care – PPO | Admitting: Physical Therapy

## 2023-05-12 DIAGNOSIS — M79662 Pain in left lower leg: Secondary | ICD-10-CM

## 2023-05-12 DIAGNOSIS — M25551 Pain in right hip: Secondary | ICD-10-CM

## 2023-05-12 DIAGNOSIS — R2689 Other abnormalities of gait and mobility: Secondary | ICD-10-CM

## 2023-05-12 DIAGNOSIS — M6281 Muscle weakness (generalized): Secondary | ICD-10-CM

## 2023-05-12 NOTE — Therapy (Signed)
OUTPATIENT PHYSICAL THERAPY TREATMENT   Patient Name: Anthony Park MRN: [id number] DOB:1961/07/29, 62 y.o., male Today's Date: 05/12/2023  END OF SESSION:  PT End of Session - 05/12/23 0908     Visit Number 10    Number of Visits 20    Date for PT Re-Evaluation 07/11/23    PT Start Time 0846    PT Stop Time 0930    PT Time Calculation (min) 44 min    Activity Tolerance Patient tolerated treatment well    Behavior During Therapy [facility] for tasks assessed/performed             Past Medical History:  Diagnosis Date   Arthritis    Diabetes mellitus without complication (HCC)    type 2 controlled with diet    Diabetic toe ulcer (HCC) 11/05/2018   History of kidney stones    passed 1 several years ago   Hyperlipidemia    Hypertension    not on medications   Peripheral vascular disease (HCC)    stent in left leg due to anerysm    Past Surgical History:  Procedure Laterality Date    Left Transmetatarsal Ampuation (Left Foot)  08/20/2020   ABDOMINAL AORTOGRAM W/LOWER EXTREMITY N/A 08/06/2020   Procedure: ABDOMINAL AORTOGRAM W/LOWER EXTREMITY;  Surgeon: [name], MD;  Location: [facility];  Service: Cardiovascular;  Laterality: N/A;   AMPUTATION Right 11/07/2018   Procedure: RIGHT 3RD TOE AMPUTATION;  Surgeon: Nadara Mustard, MD;  Location: [facility] OR;  Service: Orthopedics;  Laterality: Right;   AMPUTATION Left 08/20/2020   Procedure: Left Transmetatarsal Ampuation;  Surgeon: [name], MD;  Location: [facility] OR;  Service: Vascular;  Laterality: Left;   AMPUTATION Left 10/03/2020   Procedure: LEFT BELOW KNEE AMPUTATION;  Surgeon: [name], MD;  Location: [facility] OR;  Service: Vascular;  Laterality: Left;   AMPUTATION TOE Left    big toe and one next to it   CATARACT EXTRACTION Bilateral    EYE SURGERY Bilateral    cataracts removed   INSERTION OF ILIAC STENT Left    PERIPHERAL VASCULAR BALLOON ANGIOPLASTY Left 08/06/2020   Procedure:  PERIPHERAL VASCULAR BALLOON ANGIOPLASTY;  Surgeon: [name], MD;  Location: [facility];  Service: Cardiovascular;  Laterality: Left;  Peroneal artery.   TOE AMPUTATION Left 2015   TOTAL HIP ARTHROPLASTY Left 05/23/2020   Procedure: LEFT TOTAL HIP ARTHROPLASTY ANTERIOR APPROACH;  Surgeon: Kathryne Hitch, MD;  Location: [facility];  Service: Orthopedics;  Laterality: Left;   TOTAL HIP ARTHROPLASTY Right 07/31/2021   Procedure: RIGHT TOTAL HIP ARTHROPLASTY ANTERIOR APPROACH;  Surgeon: Kathryne Hitch, MD;  Location: [facility];  Service: Orthopedics;  Laterality: Right;   WOUND DEBRIDEMENT Left 09/11/2020   Procedure: DEBRIDEMENT OF LEFT TRANSMETATARSAL WOUND;  Surgeon: Leonie Douglas, MD;  Location: [facility] OR;  Service: Vascular;  Laterality: Left;   Patient Active Problem List   Diagnosis Date Noted   Hypogonadism, male 01/19/2023   Erectile dysfunction 01/19/2023   Aneurysm of abdominal vessel (HCC) 01/19/2023   Chronic kidney disease, stage 3a (HCC) 01/19/2023   AAA (abdominal aortic aneurysm) without rupture (HCC) 01/18/2023   S/P BKA (below knee amputation) unilateral, left (HCC) 10/03/2020   Non-healing wound of lower extremity 10/03/2020   History of amputation of left foot through metatarsal bone (HCC) 09/11/2020   PAD (peripheral artery disease) (HCC) 08/20/2020   Status post total replacement of left hip 06/05/2020   Unilateral primary osteoarthritis, right  hip 04/17/2020   Unilateral primary osteoarthritis, left hip 04/17/2020   Amputated toe, right (HCC) 11/22/2018   Type 2 diabetes mellitus with foot ulcer, without long-term current use of insulin (HCC) 11/22/2018   Mixed hyperlipidemia 11/22/2018   Aneurysm of left popliteal artery (HCC) 11/22/2018   Status post peripheral artery angioplasty with insertion of stent 11/22/2018   Osteomyelitis of third toe of right foot (HCC)    Cellulitis of third toe of right foot     PCP: Melida Quitter, MD    REFERRING PROVIDER:   Adonis Huguenin, NP    REFERRING DIAG: [id number] (ICD-10-CM) - Below-knee amputation of left lower extremity, subsequent encounter (HCC)   THERAPY DIAG:  Pain in left lower leg  Muscle weakness (generalized)  Other abnormalities of gait and mobility  Pain in right hip  Rationale for Evaluation and Treatment: Rehabilitation  ONSET DATE: 6 week onset of left residual limb pain  SUBJECTIVE:   SUBJECTIVE STATEMENT: Relays he is having more pain in his Rt buttock/hip area today that started last night as well as had phantom sensations last night.  PERTINENT HISTORY: Rt THA (anterior approach) 07/31/21. Left TTTA, left THA (05/23/20), arthritis, DM2, HTN, HLD, PVD PAIN:  Are you having pain? Yes: NPRS scale: Rt hip is about 5/10 Pain location: left distal and lateral residual limb at tibialis anterior and calf Pain description: fatigue, cramps, also reports phantom  Aggravating factors: wearing prosthesis too long sometimes, sometimes just has it at rest Relieving factors: taking off prosthesis  PRECAUTIONS: None  RED FLAGS: None   WEIGHT BEARING RESTRICTIONS: No  FALLS:  Has patient fallen in last 6 months? No   PATIENT GOALS: reduce pain in his leg  NEXT MD VISIT: nothing scheduled at eval  OBJECTIVE:   DIAGNOSTIC FINDINGS:   PATIENT SURVEYS:  FOTO not done, not a good category to place him in so will defer this  COGNITION: Overall cognitive status: Within functional limits for tasks assessed     SENSATION: Light touch: WFL   EDEMA:  Mild edema noted in distal legs bilat  PALPATION: Tender to palpation in left tibialis anterior and gastroc  LOWER EXTREMITY ROM:  Active ROM Left 05/03/23   Hip flexion    Hip extension    Hip abduction    Hip adduction    Hip internal rotation    Hip external rotation    Knee flexion WFL   Knee extension WFL   Ankle dorsiflexion    Ankle plantarflexion    Ankle inversion    Ankle  eversion     (Blank rows = not tested)  LOWER EXTREMITY MMT:  MMT Right eval Left eval  Hip flexion  WFL  Hip extension    Hip abduction    Hip adduction    Hip internal rotation    Hip external rotation    Knee flexion  WFL  Knee extension  WNL  Ankle dorsiflexion    Ankle plantarflexion    Ankle inversion    Ankle eversion     (Blank rows = not tested)    FUNCTIONAL TESTS:    GAIT: Comments: WFL using prosthesis on left, he is independent community Ambulator but reports fatigue at times    TODAY'S TREATMENT:  05/11/23 Manual therapy for IASTM with cupping STM to residual limb below knee as well as active compression and skilled palpation and Trigger Point Dry-Needling  Treatment instructions: Expect mild to moderate muscle soreness. Patient Consent Given: Yes Education handout  provided: verbally provided Muscles treated: Left tibialis anterior combined with Estim as well as Rt glutes/piriformis and TFL/ITB without Estim  Electrical stimulation provided: Yes milliamp current at frequency 80, with intensity as tolerated 2-4 Treatment response/outcome: good overall tolerance,twitch response noted  Therex:   Rt hip pirformis stretch 30 sec X 3  Rt hip ITB stretch with strap 30 sec X 3  Discussed PT plan of care recommendations    PATIENT EDUCATION: Education details: HEP, PT plan of care, self care education provided for phantom sensations, see above for details.  Person educated: Patient Education method: Programmer, multimedia, Demonstration, Verbal cues, and Handouts Education comprehension: verbalized understanding and needs further education   HOME EXERCISE PROGRAM:   ASSESSMENT:  CLINICAL IMPRESSION: We continued with manual therapy and DN combined with Estim to manage his Rt hip and and left residual limb pain. He had good tolerance to session and PT recommending to continue with current PT plan    OBJECTIVE IMPAIRMENTS: decreased activity tolerance, difficulty  walking, decreased balance, decreased endurance, decreased mobility, decreased strength, LE use, postural dysfunction, and pain.  ACTIVITY LIMITATIONS: bending, lifting, carry, locomotion, cleaning, community activity, driving  PERSONAL FACTORS: Rt THA (anterior approach) 07/31/21. Left TTTA, left THA (05/23/20), arthritis, DM2, HTN, HLD, PVD are also affecting patient's functional outcome.  REHAB POTENTIAL: Good  CLINICAL DECISION MAKING: Stable/uncomplicated  EVALUATION COMPLEXITY: Low    GOALS: Short term PT Goals Target date: 04/27/2023   Pt will be I and compliant with self care recommendations and name 3 ways to manage phantom sensation/pain.  Baseline:  Goal status: MET 05/09/23 Pt will decrease pain by 25% overall Baseline: Goal status: MET 05/09/23  Long term PT goals Target date:07/11/2023  Pt will reduce pain and phantom sensations by overall 50% Baseline: Goal status: ongoing,  Pt will be able to ambulate community distances at least 400 ft WFL gait pattern with less complaints of residual limb pain. Baseline: Goal status: ongoing 05/09/23  PLAN: PT FREQUENCY: 1-2 times per week   PT DURATION: 4-6 weeks  PLANNED INTERVENTIONS (unless contraindicated): aquatic PT, prosthetic training, Canalith repositioning, cryotherapy, Electrical stimulation, Iontophoresis with 4 mg/ml dexamethasome, Moist heat, traction, Ultrasound, gait training, Therapeutic exercise, balance training, neuromuscular re-education, patient/family education, prosthetic training, manual techniques, passive ROM, dry needling, taping, vasopnuematic device, vestibular, spinal manipulations, joint manipulations  PLAN FOR NEXT SESSION: continue with nerve desensitization techniques. DN, Rt hip stretching    April Manson, PT,DPT 05/12/2023, 9:32 AM

## 2023-05-18 ENCOUNTER — Ambulatory Visit (INDEPENDENT_AMBULATORY_CARE_PROVIDER_SITE_OTHER): Payer: BC Managed Care – PPO | Admitting: Physical Therapy

## 2023-05-18 ENCOUNTER — Encounter: Payer: Self-pay | Admitting: Physical Therapy

## 2023-05-18 DIAGNOSIS — M25551 Pain in right hip: Secondary | ICD-10-CM

## 2023-05-18 DIAGNOSIS — R2689 Other abnormalities of gait and mobility: Secondary | ICD-10-CM | POA: Diagnosis not present

## 2023-05-18 DIAGNOSIS — M6281 Muscle weakness (generalized): Secondary | ICD-10-CM | POA: Diagnosis not present

## 2023-05-18 DIAGNOSIS — M79662 Pain in left lower leg: Secondary | ICD-10-CM | POA: Diagnosis not present

## 2023-05-18 NOTE — Therapy (Signed)
OUTPATIENT PHYSICAL THERAPY TREATMENT   Patient Name: MATEUSZ ROLLS MRN: [id number] DOB:[birth year], 62 y.o., male Today's Date: 05/18/2023  END OF SESSION:  PT End of Session - 05/18/23 1301     Visit Number 11    Number of Visits 20    Date for PT Re-Evaluation 07/11/23    PT Start Time 1149    PT Stop Time 1225    PT Time Calculation (min) 36 min    Activity Tolerance Patient tolerated treatment well    Behavior During Therapy [facility] for tasks assessed/performed             Past Medical History:  Diagnosis Date   Arthritis    Diabetes mellitus without complication (HCC)    type 2 controlled with diet    Diabetic toe ulcer (HCC) 11/05/2018   History of kidney stones    passed 1 several years ago   Hyperlipidemia    Hypertension    not on medications   Peripheral vascular disease (HCC)    stent in left leg due to anerysm    Past Surgical History:  Procedure Laterality Date    Left Transmetatarsal Ampuation (Left Foot)  08/20/2020   ABDOMINAL AORTOGRAM W/LOWER EXTREMITY N/A 08/06/2020   Procedure: ABDOMINAL AORTOGRAM W/LOWER EXTREMITY;  Surgeon: [name], MD;  Location: [facility];  Service: Cardiovascular;  Laterality: N/A;   AMPUTATION Right 11/07/2018   Procedure: RIGHT 3RD TOE AMPUTATION;  Surgeon: Nadara Mustard, MD;  Location: [facility] OR;  Service: Orthopedics;  Laterality: Right;   AMPUTATION Left 08/20/2020   Procedure: Left Transmetatarsal Ampuation;  Surgeon: [name], MD;  Location: [facility] OR;  Service: Vascular;  Laterality: Left;   AMPUTATION Left 10/03/2020   Procedure: LEFT BELOW KNEE AMPUTATION;  Surgeon: [name], MD;  Location: [facility] OR;  Service: Vascular;  Laterality: Left;   AMPUTATION TOE Left    big toe and one next to it   CATARACT EXTRACTION Bilateral    EYE SURGERY Bilateral    cataracts removed   INSERTION OF ILIAC STENT Left    PERIPHERAL VASCULAR BALLOON ANGIOPLASTY Left 08/06/2020   Procedure:  PERIPHERAL VASCULAR BALLOON ANGIOPLASTY;  Surgeon: [name], MD;  Location: [facility];  Service: Cardiovascular;  Laterality: Left;  Peroneal artery.   TOE AMPUTATION Left 2015   TOTAL HIP ARTHROPLASTY Left 05/23/2020   Procedure: LEFT TOTAL HIP ARTHROPLASTY ANTERIOR APPROACH;  Surgeon: Kathryne Hitch, MD;  Location: [facility];  Service: Orthopedics;  Laterality: Left;   TOTAL HIP ARTHROPLASTY Right 07/31/2021   Procedure: RIGHT TOTAL HIP ARTHROPLASTY ANTERIOR APPROACH;  Surgeon: Kathryne Hitch, MD;  Location: [facility];  Service: Orthopedics;  Laterality: Right;   WOUND DEBRIDEMENT Left 09/11/2020   Procedure: DEBRIDEMENT OF LEFT TRANSMETATARSAL WOUND;  Surgeon: Leonie Douglas, MD;  Location: [facility] OR;  Service: Vascular;  Laterality: Left;   Patient Active Problem List   Diagnosis Date Noted   Hypogonadism, male 01/19/2023   Erectile dysfunction 01/19/2023   Aneurysm of abdominal vessel (HCC) 01/19/2023   Chronic kidney disease, stage 3a (HCC) 01/19/2023   AAA (abdominal aortic aneurysm) without rupture (HCC) 01/18/2023   S/P BKA (below knee amputation) unilateral, left (HCC) 10/03/2020   Non-healing wound of lower extremity 10/03/2020   History of amputation of left foot through metatarsal bone (HCC) 09/11/2020   PAD (peripheral artery disease) (HCC) 08/20/2020   Status post total replacement of left hip 06/05/2020   Unilateral primary osteoarthritis, right  hip 04/17/2020   Unilateral primary osteoarthritis, left hip 04/17/2020   Amputated toe, right (HCC) 11/22/2018   Type 2 diabetes mellitus with foot ulcer, without long-term current use of insulin (HCC) 11/22/2018   Mixed hyperlipidemia 11/22/2018   Aneurysm of left popliteal artery (HCC) 11/22/2018   Status post peripheral artery angioplasty with insertion of stent 11/22/2018   Osteomyelitis of third toe of right foot (HCC)    Cellulitis of third toe of right foot     PCP: Melida Quitter, MD    REFERRING PROVIDER:   Adonis Huguenin, NP    REFERRING DIAG: [phone] (ICD-10-CM) - Below-knee amputation of left lower extremity, subsequent encounter (HCC)   THERAPY DIAG:  Pain in left lower leg  Muscle weakness (generalized)  Other abnormalities of gait and mobility  Pain in right hip  Rationale for Evaluation and Treatment: Rehabilitation  ONSET DATE: 6 week onset of left residual limb pain  SUBJECTIVE:   SUBJECTIVE STATEMENT: Relays he is overall improving with the pain. His Rt hip feels back to normal now. Does endorse some pain in left residual limb upon arrival.   PERTINENT HISTORY: Rt THA (anterior approach) 07/31/21. Left TTTA, left THA (05/23/20), arthritis, DM2, HTN, HLD, PVD PAIN:  Are you having pain? Yes: NPRS scale: 3/10 Pain location: left distal and lateral residual limb at tibialis anterior and calf Pain description: fatigue, cramps, also reports phantom  Aggravating factors: wearing prosthesis too long sometimes, sometimes just has it at rest Relieving factors: taking off prosthesis  PRECAUTIONS: None  RED FLAGS: None   WEIGHT BEARING RESTRICTIONS: No  FALLS:  Has patient fallen in last 6 months? No   PATIENT GOALS: reduce pain in his leg  NEXT MD VISIT: nothing scheduled at eval  OBJECTIVE:   DIAGNOSTIC FINDINGS:   PATIENT SURVEYS:  FOTO not done, not a good category to place him in so will defer this  COGNITION: Overall cognitive status: Within functional limits for tasks assessed     SENSATION: Light touch: WFL   EDEMA:  Mild edema noted in distal legs bilat  PALPATION: Tender to palpation in left tibialis anterior and gastroc  LOWER EXTREMITY ROM:  Active ROM Left 05/03/23   Hip flexion    Hip extension    Hip abduction    Hip adduction    Hip internal rotation    Hip external rotation    Knee flexion WFL   Knee extension WFL   Ankle dorsiflexion    Ankle plantarflexion    Ankle inversion    Ankle eversion      (Blank rows = not tested)  LOWER EXTREMITY MMT:  MMT Right eval Left eval  Hip flexion  WFL  Hip extension    Hip abduction    Hip adduction    Hip internal rotation    Hip external rotation    Knee flexion  WFL  Knee extension  WNL  Ankle dorsiflexion    Ankle plantarflexion    Ankle inversion    Ankle eversion     (Blank rows = not tested)    FUNCTIONAL TESTS:    GAIT: Comments: WFL using prosthesis on left, he is independent community Ambulator but reports fatigue at times    TODAY'S TREATMENT:  05/18/23 Manual therapy for STM to residual limb below knee as well as active compression and skilled palpation and Trigger Point Dry-Needling  Treatment instructions: Expect mild to moderate muscle soreness. Patient Consent Given: Yes Education handout provided: verbally provided Muscles  treated: Left tibialis anterior combined with Estim  Electrical stimulation provided: Yes milliamp current at frequency 80, with intensity as tolerated 2-4 Treatment response/outcome: good overall tolerance,twitch response noted   05/11/23 Manual therapy for IASTM with cupping STM to residual limb below knee as well as active compression and skilled palpation and Trigger Point Dry-Needling  Treatment instructions: Expect mild to moderate muscle soreness. Patient Consent Given: Yes Education handout provided: verbally provided Muscles treated: Left tibialis anterior combined with Estim as well as Rt glutes/piriformis and TFL/ITB without Estim  Electrical stimulation provided: Yes milliamp current at frequency 80, with intensity as tolerated 2-4 Treatment response/outcome: good overall tolerance,twitch response noted  Therex:   Rt hip pirformis stretch 30 sec X 3  Rt hip ITB stretch with strap 30 sec X 3  Discussed PT plan of care recommendations    PATIENT EDUCATION: Education details: HEP, PT plan of care, self care education provided for phantom sensations, see above for details.   Person educated: Patient Education method: Programmer, multimedia, Demonstration, Verbal cues, and Handouts Education comprehension: verbalized understanding and needs further education   HOME EXERCISE PROGRAM:   ASSESSMENT:  CLINICAL IMPRESSION: I cannot really make any objective measurements since we are treating his phantom sensations and pain, His ROM and strength are good and he has no issues with prosthethic care or gait at the moment. He does relay subjective improvements with less pain and since he continues to make progress with this  PT recommending to continue with current PT plan    OBJECTIVE IMPAIRMENTS: decreased activity tolerance, difficulty walking, decreased balance, decreased endurance, decreased mobility, decreased strength, LE use, postural dysfunction, and pain.  ACTIVITY LIMITATIONS: bending, lifting, carry, locomotion, cleaning, community activity, driving  PERSONAL FACTORS: Rt THA (anterior approach) 07/31/21. Left TTTA, left THA (05/23/20), arthritis, DM2, HTN, HLD, PVD are also affecting patient's functional outcome.  REHAB POTENTIAL: Good  CLINICAL DECISION MAKING: Stable/uncomplicated  EVALUATION COMPLEXITY: Low    GOALS: Short term PT Goals Target date: 04/27/2023   Pt will be I and compliant with self care recommendations and name 3 ways to manage phantom sensation/pain.  Baseline:  Goal status: MET 05/09/23 Pt will decrease pain by 25% overall Baseline: Goal status: MET 05/09/23  Long term PT goals Target date:07/11/2023  Pt will reduce pain and phantom sensations by overall 50% Baseline: Goal status: ongoing,  Pt will be able to ambulate community distances at least 400 ft WFL gait pattern with less complaints of residual limb pain. Baseline: Goal status: ongoing 05/09/23  PLAN: PT FREQUENCY: 1-2 times per week   PT DURATION: 4-6 weeks  PLANNED INTERVENTIONS (unless contraindicated): aquatic PT, prosthetic training, Canalith repositioning,  cryotherapy, Electrical stimulation, Iontophoresis with 4 mg/ml dexamethasome, Moist heat, traction, Ultrasound, gait training, Therapeutic exercise, balance training, neuromuscular re-education, patient/family education, prosthetic training, manual techniques, passive ROM, dry needling, taping, vasopnuematic device, vestibular, spinal manipulations, joint manipulations  PLAN FOR NEXT SESSION: continue with nerve desensitization techniques. DN, Rt hip stretching    April Manson, PT,DPT 05/18/2023, 1:02 PM

## 2023-05-23 ENCOUNTER — Ambulatory Visit (INDEPENDENT_AMBULATORY_CARE_PROVIDER_SITE_OTHER): Payer: BC Managed Care – PPO | Admitting: Physical Therapy

## 2023-05-23 ENCOUNTER — Encounter: Payer: Self-pay | Admitting: Physical Therapy

## 2023-05-23 DIAGNOSIS — R2689 Other abnormalities of gait and mobility: Secondary | ICD-10-CM

## 2023-05-23 DIAGNOSIS — M25551 Pain in right hip: Secondary | ICD-10-CM

## 2023-05-23 DIAGNOSIS — M6281 Muscle weakness (generalized): Secondary | ICD-10-CM | POA: Diagnosis not present

## 2023-05-23 DIAGNOSIS — M79662 Pain in left lower leg: Secondary | ICD-10-CM

## 2023-05-23 NOTE — Therapy (Signed)
OUTPATIENT PHYSICAL THERAPY TREATMENT   Patient Name: Anthony Park MRN: [id number] DOB:03/18/1961, 62 y.o., male Today's Date: 05/23/2023  END OF SESSION:  PT End of Session - 05/23/23 0933     Visit Number 12    Number of Visits 20    Date for PT Re-Evaluation 07/11/23    PT Start Time 0930    PT Stop Time 1010    PT Time Calculation (min) 40 min    Activity Tolerance Patient tolerated treatment well    Behavior During Therapy Anthony Park for tasks assessed/performed             Past Medical History:  Diagnosis Date   Arthritis    Diabetes mellitus without complication (HCC)    type 2 controlled with diet    Diabetic toe ulcer (HCC) 11/05/2018   History of kidney stones    passed 1 several years ago   Hyperlipidemia    Hypertension    not on medications   Peripheral vascular disease (HCC)    stent in left leg due to anerysm    Past Surgical History:  Procedure Laterality Date    Left Transmetatarsal Ampuation (Left Foot)  08/20/2020   ABDOMINAL AORTOGRAM W/LOWER EXTREMITY N/A 08/06/2020   Procedure: ABDOMINAL AORTOGRAM W/LOWER EXTREMITY;  Surgeon: [name], MD;  Location: [facility];  Service: Cardiovascular;  Laterality: N/A;   AMPUTATION Right 11/07/2018   Procedure: RIGHT 3RD TOE AMPUTATION;  Surgeon: Nadara Mustard, MD;  Location: [facility] OR;  Service: Orthopedics;  Laterality: Right;   AMPUTATION Left 08/20/2020   Procedure: Left Transmetatarsal Ampuation;  Surgeon: [name], MD;  Location: [facility] OR;  Service: Vascular;  Laterality: Left;   AMPUTATION Left 10/03/2020   Procedure: LEFT BELOW KNEE AMPUTATION;  Surgeon: [name], MD;  Location: [facility] OR;  Service: Vascular;  Laterality: Left;   AMPUTATION TOE Left    big toe and one next to it   CATARACT EXTRACTION Bilateral    EYE SURGERY Bilateral    cataracts removed   INSERTION OF ILIAC STENT Left    PERIPHERAL VASCULAR BALLOON ANGIOPLASTY Left 08/06/2020   Procedure:  PERIPHERAL VASCULAR BALLOON ANGIOPLASTY;  Surgeon: [name], MD;  Location: [facility];  Service: Cardiovascular;  Laterality: Left;  Peroneal artery.   TOE AMPUTATION Left 2015   TOTAL HIP ARTHROPLASTY Left 05/23/2020   Procedure: LEFT TOTAL HIP ARTHROPLASTY ANTERIOR APPROACH;  Surgeon: Kathryne Hitch, MD;  Location: [facility];  Service: Orthopedics;  Laterality: Left;   TOTAL HIP ARTHROPLASTY Right 07/31/2021   Procedure: RIGHT TOTAL HIP ARTHROPLASTY ANTERIOR APPROACH;  Surgeon: Kathryne Hitch, MD;  Location: [facility];  Service: Orthopedics;  Laterality: Right;   WOUND DEBRIDEMENT Left 09/11/2020   Procedure: DEBRIDEMENT OF LEFT TRANSMETATARSAL WOUND;  Surgeon: Leonie Douglas, MD;  Location: [facility] OR;  Service: Vascular;  Laterality: Left;   Patient Active Problem List   Diagnosis Date Noted   Hypogonadism, male 01/19/2023   Erectile dysfunction 01/19/2023   Aneurysm of abdominal vessel (HCC) 01/19/2023   Chronic kidney disease, stage 3a (HCC) 01/19/2023   AAA (abdominal aortic aneurysm) without rupture (HCC) 01/18/2023   S/P BKA (below knee amputation) unilateral, left (HCC) 10/03/2020   Non-healing wound of lower extremity 10/03/2020   History of amputation of left foot through metatarsal bone (HCC) 09/11/2020   PAD (peripheral artery disease) (HCC) 08/20/2020   Status post total replacement of left hip 06/05/2020   Unilateral primary osteoarthritis, right  hip 04/17/2020   Unilateral primary osteoarthritis, left hip 04/17/2020   Amputated toe, right (HCC) 11/22/2018   Type 2 diabetes mellitus with foot ulcer, without long-term current use of insulin (HCC) 11/22/2018   Mixed hyperlipidemia 11/22/2018   Aneurysm of left popliteal artery (HCC) 11/22/2018   Status post peripheral artery angioplasty with insertion of stent 11/22/2018   Osteomyelitis of third toe of right foot (HCC)    Cellulitis of third toe of right foot     PCP: Anthony Quitter, MD    REFERRING PROVIDER:   Adonis Huguenin, NP    REFERRING DIAG: [phone] (ICD-10-CM) - Below-knee amputation of left lower extremity, subsequent encounter (HCC)   THERAPY DIAG:  Pain in left lower leg  Muscle weakness (generalized)  Other abnormalities of gait and mobility  Pain in right hip  Rationale for Evaluation and Treatment: Rehabilitation  ONSET DATE: 6 week onset of left residual limb pain  SUBJECTIVE:   SUBJECTIVE STATEMENT: Relays he felt good after last session all week then then weekend had increased activity tilling yard and last night woke up with a lot of pain and phantom sensations in left residual limb that has finally resolved some.  PERTINENT HISTORY: Rt THA (anterior approach) 07/31/21. Left TTTA, left THA (05/23/20), arthritis, DM2, HTN, HLD, PVD PAIN:  Are you having pain? Yes: NPRS scale: 8 last night, 2 upon arrival today/10 Pain location: left distal and lateral residual limb at tibialis anterior and calf Pain description: fatigue, cramps, also reports phantom  Aggravating factors: wearing prosthesis too long sometimes, sometimes just has it at rest Relieving factors: taking off prosthesis  PRECAUTIONS: None  RED FLAGS: None   WEIGHT BEARING RESTRICTIONS: No  FALLS:  Has patient fallen in last 6 months? No   PATIENT GOALS: reduce pain in his leg  NEXT MD VISIT: nothing scheduled at eval  OBJECTIVE:   DIAGNOSTIC FINDINGS:   PATIENT SURVEYS:  FOTO not done, not a good category to place him in so will defer this  COGNITION: Overall cognitive status: Within functional limits for tasks assessed     SENSATION: Light touch: WFL   EDEMA:  Mild edema noted in distal legs bilat  PALPATION: Tender to palpation in left tibialis anterior and gastroc  LOWER EXTREMITY ROM:  Active ROM Left 05/03/23   Hip flexion    Hip extension    Hip abduction    Hip adduction    Hip internal rotation    Hip external rotation    Knee flexion WFL    Knee extension WFL   Ankle dorsiflexion    Ankle plantarflexion    Ankle inversion    Ankle eversion     (Blank rows = not tested)  LOWER EXTREMITY MMT:  MMT Right eval Left eval  Hip flexion  WFL  Hip extension    Hip abduction    Hip adduction    Hip internal rotation    Hip external rotation    Knee flexion  WFL  Knee extension  WNL  Ankle dorsiflexion    Ankle plantarflexion    Ankle inversion    Ankle eversion     (Blank rows = not tested)    FUNCTIONAL TESTS:    GAIT: Comments: WFL using prosthesis on left, he is independent community Ambulator but reports fatigue at times    TODAY'S TREATMENT:  05/20/23 Manual therapy for STM to residual limb below knee as well as active compression and skilled palpation and Trigger Point Dry-Needling  Treatment instructions: Expect mild to moderate muscle soreness. Patient Consent Given: Yes Education handout provided: verbally provided Muscles treated: Left tibialis anterior as well as gastroc also combined with Estim  Electrical stimulation provided: Yes milliamp current at frequency 80, with intensity as tolerated 2-4 Treatment response/outcome: good overall tolerance,twitch response noted   05/18/23 Manual therapy for STM to residual limb below knee as well as active compression and skilled palpation and Trigger Point Dry-Needling  Treatment instructions: Expect mild to moderate muscle soreness. Patient Consent Given: Yes Education handout provided: verbally provided Muscles treated: Left tibialis anterior combined with Estim  Electrical stimulation provided: Yes milliamp current at frequency 80, with intensity as tolerated 2-4 Treatment response/outcome: good overall tolerance,twitch response noted    PATIENT EDUCATION: Education details: HEP, PT plan of care, self care education provided for phantom sensations, see above for details.  Person educated: Patient Education method: Explanation, Demonstration, Verbal  cues, and Handouts Education comprehension: verbalized understanding and needs further education   HOME EXERCISE PROGRAM:   ASSESSMENT:  CLINICAL IMPRESSION: He had more pain over the weekend but some of that is likely expected due to increased activity. He did get good relief today from DN combined with Estim and will try to gradually progress his activity levels outside of PT without flaring up pain too much  OBJECTIVE IMPAIRMENTS: decreased activity tolerance, difficulty walking, decreased balance, decreased endurance, decreased mobility, decreased strength, LE use, postural dysfunction, and pain.  ACTIVITY LIMITATIONS: bending, lifting, carry, locomotion, cleaning, community activity, driving  PERSONAL FACTORS: Rt THA (anterior approach) 07/31/21. Left TTTA, left THA (05/23/20), arthritis, DM2, HTN, HLD, PVD are also affecting patient's functional outcome.  REHAB POTENTIAL: Good  CLINICAL DECISION MAKING: Stable/uncomplicated  EVALUATION COMPLEXITY: Low    GOALS: Short term PT Goals Target date: 04/27/2023   Pt will be I and compliant with self care recommendations and name 3 ways to manage phantom sensation/pain.  Baseline:  Goal status: MET 05/09/23 Pt will decrease pain by 25% overall Baseline: Goal status: MET 05/09/23  Long term PT goals Target date:07/11/2023  Pt will reduce pain and phantom sensations by overall 50% Baseline: Goal status: ongoing,  Pt will be able to ambulate community distances at least 400 ft WFL gait pattern with less complaints of residual limb pain. Baseline: Goal status: ongoing 05/09/23  PLAN: PT FREQUENCY: 1-2 times per week   PT DURATION: 4-6 weeks  PLANNED INTERVENTIONS (unless contraindicated): aquatic PT, prosthetic training, Canalith repositioning, cryotherapy, Electrical stimulation, Iontophoresis with 4 mg/ml dexamethasome, Moist heat, traction, Ultrasound, gait training, Therapeutic exercise, balance training, neuromuscular  re-education, patient/family education, prosthetic training, manual techniques, passive ROM, dry needling, taping, vasopnuematic device, vestibular, spinal manipulations, joint manipulations  PLAN FOR NEXT SESSION: continue with nerve desensitization techniques. DN, Rt hip stretching    April Manson, PT,DPT 05/23/2023, 10:24 AM

## 2023-05-26 ENCOUNTER — Encounter: Payer: Self-pay | Admitting: Physical Therapy

## 2023-05-26 ENCOUNTER — Ambulatory Visit (INDEPENDENT_AMBULATORY_CARE_PROVIDER_SITE_OTHER): Payer: BC Managed Care – PPO | Admitting: Physical Therapy

## 2023-05-26 DIAGNOSIS — M25551 Pain in right hip: Secondary | ICD-10-CM

## 2023-05-26 DIAGNOSIS — R2689 Other abnormalities of gait and mobility: Secondary | ICD-10-CM

## 2023-05-26 DIAGNOSIS — R2681 Unsteadiness on feet: Secondary | ICD-10-CM

## 2023-05-26 DIAGNOSIS — M6281 Muscle weakness (generalized): Secondary | ICD-10-CM

## 2023-05-26 DIAGNOSIS — M79662 Pain in left lower leg: Secondary | ICD-10-CM

## 2023-05-26 NOTE — Therapy (Signed)
OUTPATIENT PHYSICAL THERAPY TREATMENT   Patient Name: Anthony Park MRN: [id number] DOB:12/07/1960, 62 y.o., male Today's Date: 05/26/2023  END OF SESSION:  PT End of Session - 05/26/23 1228     Visit Number 13    Number of Visits 20    Date for PT Re-Evaluation 07/11/23    PT Start Time 0935    PT Stop Time 1017    PT Time Calculation (min) 42 min    Activity Tolerance Patient tolerated treatment well    Behavior During Therapy [facility] for tasks assessed/performed             Past Medical History:  Diagnosis Date   Arthritis    Diabetes mellitus without complication (HCC)    type 2 controlled with diet    Diabetic toe ulcer (HCC) 11/05/2018   History of kidney stones    passed 1 several years ago   Hyperlipidemia    Hypertension    not on medications   Peripheral vascular disease (HCC)    stent in left leg due to anerysm    Past Surgical History:  Procedure Laterality Date    Left Transmetatarsal Ampuation (Left Foot)  08/20/2020   ABDOMINAL AORTOGRAM W/LOWER EXTREMITY N/A 08/06/2020   Procedure: ABDOMINAL AORTOGRAM W/LOWER EXTREMITY;  Surgeon: [name], MD;  Location: [facility];  Service: Cardiovascular;  Laterality: N/A;   AMPUTATION Right 11/07/2018   Procedure: RIGHT 3RD TOE AMPUTATION;  Surgeon: Nadara Mustard, MD;  Location: [facility] OR;  Service: Orthopedics;  Laterality: Right;   AMPUTATION Left 08/20/2020   Procedure: Left Transmetatarsal Ampuation;  Surgeon: [name], MD;  Location: [facility] OR;  Service: Vascular;  Laterality: Left;   AMPUTATION Left 10/03/2020   Procedure: LEFT BELOW KNEE AMPUTATION;  Surgeon: [name], MD;  Location: [facility] OR;  Service: Vascular;  Laterality: Left;   AMPUTATION TOE Left    big toe and one next to it   CATARACT EXTRACTION Bilateral    EYE SURGERY Bilateral    cataracts removed   INSERTION OF ILIAC STENT Left    PERIPHERAL VASCULAR BALLOON ANGIOPLASTY Left 08/06/2020   Procedure:  PERIPHERAL VASCULAR BALLOON ANGIOPLASTY;  Surgeon: [name], MD;  Location: [facility];  Service: Cardiovascular;  Laterality: Left;  Peroneal artery.   TOE AMPUTATION Left 2015   TOTAL HIP ARTHROPLASTY Left 05/23/2020   Procedure: LEFT TOTAL HIP ARTHROPLASTY ANTERIOR APPROACH;  Surgeon: Kathryne Hitch, MD;  Location: [facility];  Service: Orthopedics;  Laterality: Left;   TOTAL HIP ARTHROPLASTY Right 07/31/2021   Procedure: RIGHT TOTAL HIP ARTHROPLASTY ANTERIOR APPROACH;  Surgeon: Kathryne Hitch, MD;  Location: [facility];  Service: Orthopedics;  Laterality: Right;   WOUND DEBRIDEMENT Left 09/11/2020   Procedure: DEBRIDEMENT OF LEFT TRANSMETATARSAL WOUND;  Surgeon: Leonie Douglas, MD;  Location: [facility] OR;  Service: Vascular;  Laterality: Left;   Patient Active Problem List   Diagnosis Date Noted   Hypogonadism, male 01/19/2023   Erectile dysfunction 01/19/2023   Aneurysm of abdominal vessel (HCC) 01/19/2023   Chronic kidney disease, stage 3a (HCC) 01/19/2023   AAA (abdominal aortic aneurysm) without rupture (HCC) 01/18/2023   S/P BKA (below knee amputation) unilateral, left (HCC) 10/03/2020   Non-healing wound of lower extremity 10/03/2020   History of amputation of left foot through metatarsal bone (HCC) 09/11/2020   PAD (peripheral artery disease) (HCC) 08/20/2020   Status post total replacement of left hip 06/05/2020   Unilateral primary osteoarthritis, right  hip 04/17/2020   Unilateral primary osteoarthritis, left hip 04/17/2020   Amputated toe, right (HCC) 11/22/2018   Type 2 diabetes mellitus with foot ulcer, without long-term current use of insulin (HCC) 11/22/2018   Mixed hyperlipidemia 11/22/2018   Aneurysm of left popliteal artery (HCC) 11/22/2018   Status post peripheral artery angioplasty with insertion of stent 11/22/2018   Osteomyelitis of third toe of right foot (HCC)    Cellulitis of third toe of right foot     PCP: Melida Quitter, MD    REFERRING PROVIDER:   Adonis Huguenin, NP    REFERRING DIAG: [phone] (ICD-10-CM) - Below-knee amputation of left lower extremity, subsequent encounter (HCC)   THERAPY DIAG:  Pain in left lower leg  Muscle weakness (generalized)  Other abnormalities of gait and mobility  Pain in right hip  Unsteadiness on feet  Rationale for Evaluation and Treatment: Rehabilitation  ONSET DATE: 6 week onset of left residual limb pain  SUBJECTIVE:   SUBJECTIVE STATEMENT: Still with phantom sensations in left residual limb but the pain is better  PERTINENT HISTORY: Rt THA (anterior approach) 07/31/21. Left TTTA, left THA (05/23/20), arthritis, DM2, HTN, HLD, PVD PAIN:  Are you having pain? Yes: NPRS scale: 2 upon arrival today/10 Pain location: left distal and lateral residual limb at tibialis anterior and calf Pain description: fatigue, cramps, also reports phantom  Aggravating factors: wearing prosthesis too long sometimes, sometimes just has it at rest Relieving factors: taking off prosthesis  PRECAUTIONS: None  RED FLAGS: None   WEIGHT BEARING RESTRICTIONS: No  FALLS:  Has patient fallen in last 6 months? No   PATIENT GOALS: reduce pain in his leg  NEXT MD VISIT: nothing scheduled at eval  OBJECTIVE:   DIAGNOSTIC FINDINGS:   PATIENT SURVEYS:  FOTO not done, not a good category to place him in so will defer this  COGNITION: Overall cognitive status: Within functional limits for tasks assessed     SENSATION: Light touch: WFL   EDEMA:  Mild edema noted in distal legs bilat  PALPATION: Tender to palpation in left tibialis anterior and gastroc  LOWER EXTREMITY ROM:  Active ROM Left 05/03/23   Hip flexion    Hip extension    Hip abduction    Hip adduction    Hip internal rotation    Hip external rotation    Knee flexion WFL   Knee extension WFL   Ankle dorsiflexion    Ankle plantarflexion    Ankle inversion    Ankle eversion     (Blank rows = not  tested)  LOWER EXTREMITY MMT:  MMT Right eval Left eval  Hip flexion  WFL  Hip extension    Hip abduction    Hip adduction    Hip internal rotation    Hip external rotation    Knee flexion  WFL  Knee extension  WNL  Ankle dorsiflexion    Ankle plantarflexion    Ankle inversion    Ankle eversion     (Blank rows = not tested)    FUNCTIONAL TESTS:    GAIT: Comments: WFL using prosthesis on left, he is independent community Ambulator but reports fatigue at times    TODAY'S TREATMENT:  05/26/23 Manual therapy for STM and IASTM with graston tool to residual limb below knee as well as active compression and skilled palpation and Trigger Point Dry-Needling  Treatment instructions: Expect mild to moderate muscle soreness. Patient Consent Given: Yes Education handout provided: verbally provided Muscles treated: Left  tibialis anterior as well as gastroc also combined with Estim  Electrical stimulation provided: Yes milliamp current at frequency 2-50, with intensity as tolerated 2-4 Treatment response/outcome: good overall tolerance,twitch response noted    PATIENT EDUCATION: Education details: HEP, PT plan of care, self care education provided for phantom sensations, see above for details.  Person educated: Patient Education method: Explanation, Demonstration, Verbal cues, and Handouts Education comprehension: verbalized understanding and needs further education   HOME EXERCISE PROGRAM:   ASSESSMENT:  CLINICAL IMPRESSION: He reports he still has some phantom sensations of feeling tingling where his toes would be. He does still feel that PT has been helpful and wants to continue as his pain is getting better overall despite having the phantom sensations.   OBJECTIVE IMPAIRMENTS: decreased activity tolerance, difficulty walking, decreased balance, decreased endurance, decreased mobility, decreased strength, LE use, postural dysfunction, and pain.  ACTIVITY LIMITATIONS:  bending, lifting, carry, locomotion, cleaning, community activity, driving  PERSONAL FACTORS: Rt THA (anterior approach) 07/31/21. Left TTTA, left THA (05/23/20), arthritis, DM2, HTN, HLD, PVD are also affecting patient's functional outcome.  REHAB POTENTIAL: Good  CLINICAL DECISION MAKING: Stable/uncomplicated  EVALUATION COMPLEXITY: Low    GOALS: Short term PT Goals Target date: 04/27/2023   Pt will be I and compliant with self care recommendations and name 3 ways to manage phantom sensation/pain.  Baseline:  Goal status: MET 05/09/23 Pt will decrease pain by 25% overall Baseline: Goal status: MET 05/09/23  Long term PT goals Target date:07/11/2023  Pt will reduce pain and phantom sensations by overall 50% Baseline: Goal status: ongoing,  Pt will be able to ambulate community distances at least 400 ft WFL gait pattern with less complaints of residual limb pain. Baseline: Goal status: ongoing 05/09/23  PLAN: PT FREQUENCY: 1-2 times per week   PT DURATION: 4-6 weeks  PLANNED INTERVENTIONS (unless contraindicated): aquatic PT, prosthetic training, Canalith repositioning, cryotherapy, Electrical stimulation, Iontophoresis with 4 mg/ml dexamethasome, Moist heat, traction, Ultrasound, gait training, Therapeutic exercise, balance training, neuromuscular re-education, patient/family education, prosthetic training, manual techniques, passive ROM, dry needling, taping, vasopnuematic device, vestibular, spinal manipulations, joint manipulations  PLAN FOR NEXT SESSION: continue with nerve desensitization techniques. DN, Rt hip stretching    April Manson, PT,DPT 05/26/2023, 12:29 PM

## 2023-05-30 ENCOUNTER — Encounter: Payer: Self-pay | Admitting: Physical Therapy

## 2023-05-30 ENCOUNTER — Ambulatory Visit (INDEPENDENT_AMBULATORY_CARE_PROVIDER_SITE_OTHER): Payer: BC Managed Care – PPO | Admitting: Physical Therapy

## 2023-05-30 DIAGNOSIS — R2681 Unsteadiness on feet: Secondary | ICD-10-CM

## 2023-05-30 DIAGNOSIS — M79662 Pain in left lower leg: Secondary | ICD-10-CM | POA: Diagnosis not present

## 2023-05-30 DIAGNOSIS — M6281 Muscle weakness (generalized): Secondary | ICD-10-CM

## 2023-05-30 DIAGNOSIS — M25551 Pain in right hip: Secondary | ICD-10-CM | POA: Diagnosis not present

## 2023-05-30 DIAGNOSIS — R2689 Other abnormalities of gait and mobility: Secondary | ICD-10-CM

## 2023-05-30 NOTE — Therapy (Signed)
OUTPATIENT PHYSICAL THERAPY TREATMENT   Patient Name: Anthony Park MRN: [id number] DOB:[birth year], 62 y.o., male Today's Date: 05/30/2023  END OF SESSION:  PT End of Session - 05/30/23 0907     Visit Number 14    Number of Visits 20    Date for PT Re-Evaluation 07/11/23    PT Start Time 0845    PT Stop Time 0925    PT Time Calculation (min) 40 min    Activity Tolerance Patient tolerated treatment well    Behavior During Therapy [facility] for tasks assessed/performed             Past Medical History:  Diagnosis Date   Arthritis    Diabetes mellitus without complication (HCC)    type 2 controlled with diet    Diabetic toe ulcer (HCC) 11/05/2018   History of kidney stones    passed 1 several years ago   Hyperlipidemia    Hypertension    not on medications   Peripheral vascular disease (HCC)    stent in left leg due to anerysm    Past Surgical History:  Procedure Laterality Date    Left Transmetatarsal Ampuation (Left Foot)  08/20/2020   ABDOMINAL AORTOGRAM W/LOWER EXTREMITY N/A 08/06/2020   Procedure: ABDOMINAL AORTOGRAM W/LOWER EXTREMITY;  Surgeon: [name], MD;  Location: [facility];  Service: Cardiovascular;  Laterality: N/A;   AMPUTATION Right 11/07/2018   Procedure: RIGHT 3RD TOE AMPUTATION;  Surgeon: Nadara Mustard, MD;  Location: [facility] OR;  Service: Orthopedics;  Laterality: Right;   AMPUTATION Left 08/20/2020   Procedure: Left Transmetatarsal Ampuation;  Surgeon: [name], MD;  Location: [facility] OR;  Service: Vascular;  Laterality: Left;   AMPUTATION Left 10/03/2020   Procedure: LEFT BELOW KNEE AMPUTATION;  Surgeon: [name], MD;  Location: [facility] OR;  Service: Vascular;  Laterality: Left;   AMPUTATION TOE Left    big toe and one next to it   CATARACT EXTRACTION Bilateral    EYE SURGERY Bilateral    cataracts removed   INSERTION OF ILIAC STENT Left    PERIPHERAL VASCULAR BALLOON ANGIOPLASTY Left 08/06/2020   Procedure:  PERIPHERAL VASCULAR BALLOON ANGIOPLASTY;  Surgeon: [name], MD;  Location: [facility];  Service: Cardiovascular;  Laterality: Left;  Peroneal artery.   TOE AMPUTATION Left 2015   TOTAL HIP ARTHROPLASTY Left 05/23/2020   Procedure: LEFT TOTAL HIP ARTHROPLASTY ANTERIOR APPROACH;  Surgeon: Kathryne Hitch, MD;  Location: [facility] ORS;  Service: Orthopedics;  Laterality: Left;   TOTAL HIP ARTHROPLASTY Right 07/31/2021   Procedure: RIGHT TOTAL HIP ARTHROPLASTY ANTERIOR APPROACH;  Surgeon: Kathryne Hitch, MD;  Location: [facility] OR[facility];  Service: Orthopedics;  Laterality: Right;   WOUND DEBRIDEMENT Left 09/11/2020   Procedure: DEBRIDEMENT OF LEFT TRANSMETATARSAL WOUND;  Surgeon: Leonie Douglas, MD;  Location: [facility] OR;  Service: Vascular;  Laterality: Left;   Patient Active Problem List   Diagnosis Date Noted   Hypogonadism, male 01/19/2023   Erectile dysfunction 01/19/2023   Aneurysm of abdominal vessel (HCC) 01/19/2023   Chronic kidney disease, stage 3a (HCC) 01/19/2023   AAA (abdominal aortic aneurysm) without rupture (HCC) 01/18/2023   S/P BKA (below knee amputation) unilateral, left (HCC) 10/03/2020   Non-healing wound of lower extremity 10/03/2020   History of amputation of left foot through metatarsal bone (HCC) 09/11/2020   PAD (peripheral artery disease) (HCC) 08/20/2020   Status post total replacement of left hip 06/05/2020   Unilateral primary osteoarthritis, right  hip 04/17/2020   Unilateral primary osteoarthritis, left hip 04/17/2020   Amputated toe, right (HCC) 11/22/2018   Type 2 diabetes mellitus with foot ulcer, without long-term current use of insulin (HCC) 11/22/2018   Mixed hyperlipidemia 11/22/2018   Aneurysm of left popliteal artery (HCC) 11/22/2018   Status post peripheral artery angioplasty with insertion of stent 11/22/2018   Osteomyelitis of third toe of right foot (HCC)    Cellulitis of third toe of right foot     PCP: Melida Quitter, MD    REFERRING PROVIDER:   Adonis Huguenin, NP    REFERRING DIAG: [phone] (ICD-10-CM) - Below-knee amputation of left lower extremity, subsequent encounter (HCC)   THERAPY DIAG:  Pain in left lower leg  Muscle weakness (generalized)  Other abnormalities of gait and mobility  Pain in right hip  Unsteadiness on feet  Rationale for Evaluation and Treatment: Rehabilitation  ONSET DATE: 6 week onset of left residual limb pain  SUBJECTIVE:   SUBJECTIVE STATEMENT: He did sleep through the night last night without being awoken due to the pain. He does endorse when he first puts on prosthesis in the morning he gets pain and phantom sensations that limit how long he can wear it, then he takes it off for a period and can put it back on and wear it longer.  PERTINENT HISTORY: Rt THA (anterior approach) 07/31/21. Left TTTA, left THA (05/23/20), arthritis, DM2, HTN, HLD, PVD PAIN:  Are you having pain? Yes: NPRS scale: 4 upon arrival today/10 Pain location: left distal and lateral residual limb at tibialis anterior and calf Pain description: fatigue, cramps, also reports phantom  Aggravating factors: wearing prosthesis too long sometimes, sometimes just has it at rest Relieving factors: taking off prosthesis  PRECAUTIONS: None  RED FLAGS: None   WEIGHT BEARING RESTRICTIONS: No  FALLS:  Has patient fallen in last 6 months? No   PATIENT GOALS: reduce pain in his leg  NEXT MD VISIT: nothing scheduled at eval  OBJECTIVE:   DIAGNOSTIC FINDINGS:   PATIENT SURVEYS:  FOTO not done, not a good category to place him in so will defer this  COGNITION: Overall cognitive status: Within functional limits for tasks assessed     SENSATION: Light touch: WFL   EDEMA:  Mild edema noted in distal legs bilat  PALPATION: Tender to palpation in left tibialis anterior and gastroc  LOWER EXTREMITY ROM:  Active ROM Left 05/03/23   Hip flexion    Hip extension    Hip abduction    Hip  adduction    Hip internal rotation    Hip external rotation    Knee flexion WFL   Knee extension WFL   Ankle dorsiflexion    Ankle plantarflexion    Ankle inversion    Ankle eversion     (Blank rows = not tested)  LOWER EXTREMITY MMT:  MMT Right eval Left eval  Hip flexion  WFL  Hip extension    Hip abduction    Hip adduction    Hip internal rotation    Hip external rotation    Knee flexion  WFL  Knee extension  WNL  Ankle dorsiflexion    Ankle plantarflexion    Ankle inversion    Ankle eversion     (Blank rows = not tested)    FUNCTIONAL TESTS:    GAIT: Comments: WFL using prosthesis on left, he is independent community Ambulator but reports fatigue at times    TODAY'S TREATMENT:  05/30/23 Manual  therapy for STM and  as well as active compression and skilled palpation and Trigger Point Dry-Needling  Treatment instructions: Expect mild to moderate muscle soreness. Patient Consent Given: Yes Education handout provided: verbally provided Muscles treated: Left tibialis anterior as well as gastroc also combined with Estim  Electrical stimulation provided: Yes milliamp current at frequency 2-50, with intensity as tolerated 2-4 Treatment response/outcome: good overall tolerance,twitch response noted    PATIENT EDUCATION: Education details: HEP, PT plan of care, self care education provided for phantom sensations, see above for details.  Person educated: Patient Education method: Explanation, Demonstration, Verbal cues, and Handouts Education comprehension: verbalized understanding and needs further education   HOME EXERCISE PROGRAM:   ASSESSMENT:  CLINICAL IMPRESSION: He continues to report benefit from PT. He is able to gradually increase his activity and tolerance to prosthesis but still remains limited and he has to limit the amount of housework and standing activity that he can tolerate.  OBJECTIVE IMPAIRMENTS: decreased activity tolerance, difficulty  walking, decreased balance, decreased endurance, decreased mobility, decreased strength, LE use, postural dysfunction, and pain.  ACTIVITY LIMITATIONS: bending, lifting, carry, locomotion, cleaning, community activity, driving  PERSONAL FACTORS: Rt THA (anterior approach) 07/31/21. Left TTTA, left THA (05/23/20), arthritis, DM2, HTN, HLD, PVD are also affecting patient's functional outcome.  REHAB POTENTIAL: Good  CLINICAL DECISION MAKING: Stable/uncomplicated  EVALUATION COMPLEXITY: Low    GOALS: Short term PT Goals Target date: 04/27/2023   Pt will be I and compliant with self care recommendations and name 3 ways to manage phantom sensation/pain.  Baseline:  Goal status: MET 05/09/23 Pt will decrease pain by 25% overall Baseline: Goal status: MET 05/09/23  Long term PT goals Target date:07/11/2023  Pt will reduce pain and phantom sensations by overall 50% Baseline: Goal status: ongoing,  Pt will be able to ambulate community distances at least 400 ft WFL gait pattern with less complaints of residual limb pain. Baseline: Goal status: ongoing 05/09/23  PLAN: PT FREQUENCY: 1-2 times per week   PT DURATION: 4-6 weeks  PLANNED INTERVENTIONS (unless contraindicated): aquatic PT, prosthetic training, Canalith repositioning, cryotherapy, Electrical stimulation, Iontophoresis with 4 mg/ml dexamethasome, Moist heat, traction, Ultrasound, gait training, Therapeutic exercise, balance training, neuromuscular re-education, patient/family education, prosthetic training, manual techniques, passive ROM, dry needling, taping, vasopnuematic device, vestibular, spinal manipulations, joint manipulations  PLAN FOR NEXT SESSION: continue with nerve desensitization techniques. DN, Rt hip stretching    April Manson, PT,DPT 05/30/2023, 9:14 AM

## 2023-06-01 ENCOUNTER — Encounter: Payer: BC Managed Care – PPO | Admitting: Physical Therapy

## 2023-06-01 DIAGNOSIS — Z125 Encounter for screening for malignant neoplasm of prostate: Secondary | ICD-10-CM | POA: Diagnosis not present

## 2023-06-01 DIAGNOSIS — E291 Testicular hypofunction: Secondary | ICD-10-CM | POA: Diagnosis not present

## 2023-06-01 DIAGNOSIS — N5201 Erectile dysfunction due to arterial insufficiency: Secondary | ICD-10-CM | POA: Diagnosis not present

## 2023-06-06 ENCOUNTER — Encounter: Payer: BC Managed Care – PPO | Admitting: Physical Therapy

## 2023-06-06 DIAGNOSIS — Z87891 Personal history of nicotine dependence: Secondary | ICD-10-CM | POA: Diagnosis not present

## 2023-06-08 ENCOUNTER — Encounter: Payer: Self-pay | Admitting: Physical Therapy

## 2023-06-08 ENCOUNTER — Ambulatory Visit (INDEPENDENT_AMBULATORY_CARE_PROVIDER_SITE_OTHER): Payer: BC Managed Care – PPO | Admitting: Physical Therapy

## 2023-06-08 DIAGNOSIS — M79662 Pain in left lower leg: Secondary | ICD-10-CM | POA: Diagnosis not present

## 2023-06-08 DIAGNOSIS — R2689 Other abnormalities of gait and mobility: Secondary | ICD-10-CM | POA: Diagnosis not present

## 2023-06-08 DIAGNOSIS — M6281 Muscle weakness (generalized): Secondary | ICD-10-CM | POA: Diagnosis not present

## 2023-06-08 NOTE — Therapy (Signed)
OUTPATIENT PHYSICAL THERAPY TREATMENT   Patient Name: Anthony Park MRN: [id number] DOB:08/28/1961, 62 y.o., male Today's Date: 06/08/2023  END OF SESSION:  PT End of Session - 06/08/23 0950     Visit Number 15    Number of Visits 20    Date for PT Re-Evaluation 07/11/23    PT Start Time 0855    PT Stop Time 0930    PT Time Calculation (min) 35 min    Activity Tolerance Patient tolerated treatment well    Behavior During Therapy [facility] for tasks assessed/performed             Past Medical History:  Diagnosis Date   Arthritis    Diabetes mellitus without complication (HCC)    type 2 controlled with diet    Diabetic toe ulcer (HCC) 11/05/2018   History of kidney stones    passed 1 several years ago   Hyperlipidemia    Hypertension    not on medications   Peripheral vascular disease (HCC)    stent in left leg due to anerysm    Past Surgical History:  Procedure Laterality Date    Left Transmetatarsal Ampuation (Left Foot)  08/20/2020   ABDOMINAL AORTOGRAM W/LOWER EXTREMITY N/A 08/06/2020   Procedure: ABDOMINAL AORTOGRAM W/LOWER EXTREMITY;  Surgeon: [name], MD;  Location: [facility];  Service: Cardiovascular;  Laterality: N/A;   AMPUTATION Right 11/07/2018   Procedure: RIGHT 3RD TOE AMPUTATION;  Surgeon: Nadara Mustard, MD;  Location: [facility] OR;  Service: Orthopedics;  Laterality: Right;   AMPUTATION Left 08/20/2020   Procedure: Left Transmetatarsal Ampuation;  Surgeon: [name], MD;  Location: [facility] OR;  Service: Vascular;  Laterality: Left;   AMPUTATION Left 10/03/2020   Procedure: LEFT BELOW KNEE AMPUTATION;  Surgeon: [name], MD;  Location: [facility] OR;  Service: Vascular;  Laterality: Left;   AMPUTATION TOE Left    big toe and one next to it   CATARACT EXTRACTION Bilateral    EYE SURGERY Bilateral    cataracts removed   INSERTION OF ILIAC STENT Left    PERIPHERAL VASCULAR BALLOON ANGIOPLASTY Left 08/06/2020   Procedure:  PERIPHERAL VASCULAR BALLOON ANGIOPLASTY;  Surgeon: [name], MD;  Location: [facility];  Service: Cardiovascular;  Laterality: Left;  Peroneal artery.   TOE AMPUTATION Left 2015   TOTAL HIP ARTHROPLASTY Left 05/23/2020   Procedure: LEFT TOTAL HIP ARTHROPLASTY ANTERIOR APPROACH;  Surgeon: Kathryne Hitch, MD;  Location: [facility];  Service: Orthopedics;  Laterality: Left;   TOTAL HIP ARTHROPLASTY Right 07/31/2021   Procedure: RIGHT TOTAL HIP ARTHROPLASTY ANTERIOR APPROACH;  Surgeon: Kathryne Hitch, MD;  Location: [facility];  Service: Orthopedics;  Laterality: Right;   WOUND DEBRIDEMENT Left 09/11/2020   Procedure: DEBRIDEMENT OF LEFT TRANSMETATARSAL WOUND;  Surgeon: Leonie Douglas, MD;  Location: [facility] OR;  Service: Vascular;  Laterality: Left;   Patient Active Problem List   Diagnosis Date Noted   Hypogonadism, male 01/19/2023   Erectile dysfunction 01/19/2023   Aneurysm of abdominal vessel (HCC) 01/19/2023   Chronic kidney disease, stage 3a (HCC) 01/19/2023   AAA (abdominal aortic aneurysm) without rupture (HCC) 01/18/2023   S/P BKA (below knee amputation) unilateral, left (HCC) 10/03/2020   Non-healing wound of lower extremity 10/03/2020   History of amputation of left foot through metatarsal bone (HCC) 09/11/2020   PAD (peripheral artery disease) (HCC) 08/20/2020   Status post total replacement of left hip 06/05/2020   Unilateral primary osteoarthritis, right  hip 04/17/2020   Unilateral primary osteoarthritis, left hip 04/17/2020   Amputated toe, right (HCC) 11/22/2018   Type 2 diabetes mellitus with foot ulcer, without long-term current use of insulin (HCC) 11/22/2018   Mixed hyperlipidemia 11/22/2018   Aneurysm of left popliteal artery (HCC) 11/22/2018   Status post peripheral artery angioplasty with insertion of stent 11/22/2018   Osteomyelitis of third toe of right foot (HCC)    Cellulitis of third toe of right foot     PCP: Melida Quitter, MD    REFERRING PROVIDER:   Adonis Huguenin, NP    REFERRING DIAG: [phone] (ICD-10-CM) - Below-knee amputation of left lower extremity, subsequent encounter (HCC)   THERAPY DIAG:  Pain in left lower leg  Muscle weakness (generalized)  Other abnormalities of gait and mobility  Rationale for Evaluation and Treatment: Rehabilitation  ONSET DATE: 6 week onset of left residual limb pain  SUBJECTIVE:   SUBJECTIVE STATEMENT: He awoke with pain, it has calmed down some after meds. He says he is only able to tolerate wearing his prosthesis about 3-4 minutes the first time but then he can take it off and when he puts it back on he can wear it longer  PERTINENT HISTORY: Rt THA (anterior approach) 07/31/21. Left TTTA, left THA (05/23/20), arthritis, DM2, HTN, HLD, PVD PAIN:  Are you having pain? Yes: NPRS scale: 4 upon arrival today/10 Pain location: left distal and lateral residual limb at tibialis anterior and calf Pain description: fatigue, cramps, also reports phantom  Aggravating factors: wearing prosthesis too long sometimes, sometimes just has it at rest Relieving factors: taking off prosthesis  PRECAUTIONS: None  RED FLAGS: None   WEIGHT BEARING RESTRICTIONS: No  FALLS:  Has patient fallen in last 6 months? No   PATIENT GOALS: reduce pain in his leg  NEXT MD VISIT: nothing scheduled at eval  OBJECTIVE:   DIAGNOSTIC FINDINGS:   PATIENT SURVEYS:  FOTO not done, not a good category to place him in so will defer this  COGNITION: Overall cognitive status: Within functional limits for tasks assessed     SENSATION: Light touch: WFL   EDEMA:  Mild edema noted in distal legs bilat  PALPATION: Tender to palpation in left tibialis anterior and gastroc  LOWER EXTREMITY ROM:  Active ROM Left 05/03/23   Hip flexion    Hip extension    Hip abduction    Hip adduction    Hip internal rotation    Hip external rotation    Knee flexion WFL   Knee extension WFL    Ankle dorsiflexion    Ankle plantarflexion    Ankle inversion    Ankle eversion     (Blank rows = not tested)  LOWER EXTREMITY MMT:  MMT Right eval Left eval  Hip flexion  WFL  Hip extension    Hip abduction    Hip adduction    Hip internal rotation    Hip external rotation    Knee flexion  WFL  Knee extension  WNL  Ankle dorsiflexion    Ankle plantarflexion    Ankle inversion    Ankle eversion     (Blank rows = not tested)    FUNCTIONAL TESTS:    GAIT: Comments: WFL using prosthesis on left, he is independent community Ambulator but reports fatigue at times    TODAY'S TREATMENT:  06/08/23 Manual therapy for STM and  as well as active compression and skilled palpation and Trigger Point Dry-Needling  Treatment instructions: Expect  mild to moderate muscle soreness. Patient Consent Given: Yes Education handout provided: verbally provided Muscles treated: Left tibialis anterior as well as gastroc also combined with Estim  Electrical stimulation provided: Yes milliamp current at frequency 2-50, with intensity as tolerated 2-4 Treatment response/outcome: good overall tolerance,twitch response noted    PATIENT EDUCATION: Education details: HEP, PT plan of care, self care education provided for phantom sensations, see above for details.  Person educated: Patient Education method: Programmer, multimedia, Demonstration, Verbal cues, and Handouts Education comprehension: verbalized understanding and needs further education   HOME EXERCISE PROGRAM:   ASSESSMENT:  CLINICAL IMPRESSION: He is limited first thing in the morning wearing prosthesis but then he can redon it later and it feels better and he can perform more overall standing activity but does continue to be limited with this overall. He will continue to benefit from PT to improve function and reduce pain.  OBJECTIVE IMPAIRMENTS: decreased activity tolerance, difficulty walking, decreased balance, decreased endurance,  decreased mobility, decreased strength, LE use, postural dysfunction, and pain.  ACTIVITY LIMITATIONS: bending, lifting, carry, locomotion, cleaning, community activity, driving  PERSONAL FACTORS: Rt THA (anterior approach) 07/31/21. Left TTTA, left THA (05/23/20), arthritis, DM2, HTN, HLD, PVD are also affecting patient's functional outcome.  REHAB POTENTIAL: Good  CLINICAL DECISION MAKING: Stable/uncomplicated  EVALUATION COMPLEXITY: Low    GOALS: Short term PT Goals Target date: 04/27/2023   Pt will be I and compliant with self care recommendations and name 3 ways to manage phantom sensation/pain.  Baseline:  Goal status: MET 05/09/23 Pt will decrease pain by 25% overall Baseline: Goal status: MET 05/09/23  Long term PT goals Target date:07/11/2023  Pt will reduce pain and phantom sensations by overall 50% Baseline: Goal status: ongoing,  Pt will be able to ambulate community distances at least 400 ft WFL gait pattern with less complaints of residual limb pain. Baseline: Goal status: ongoing 05/09/23  PLAN: PT FREQUENCY: 1-2 times per week   PT DURATION: 4-6 weeks  PLANNED INTERVENTIONS (unless contraindicated): aquatic PT, prosthetic training, Canalith repositioning, cryotherapy, Electrical stimulation, Iontophoresis with 4 mg/ml dexamethasome, Moist heat, traction, Ultrasound, gait training, Therapeutic exercise, balance training, neuromuscular re-education, patient/family education, prosthetic training, manual techniques, passive ROM, dry needling, taping, vasopnuematic device, vestibular, spinal manipulations, joint manipulations  PLAN FOR NEXT SESSION: continue with nerve desensitization techniques. DN, Rt hip stretching    April Manson, PT,DPT 06/08/2023, 9:50 AM

## 2023-06-13 ENCOUNTER — Ambulatory Visit (INDEPENDENT_AMBULATORY_CARE_PROVIDER_SITE_OTHER): Payer: BC Managed Care – PPO | Admitting: Physical Therapy

## 2023-06-13 ENCOUNTER — Encounter: Payer: Self-pay | Admitting: Physical Therapy

## 2023-06-13 DIAGNOSIS — M79662 Pain in left lower leg: Secondary | ICD-10-CM | POA: Diagnosis not present

## 2023-06-13 DIAGNOSIS — R2689 Other abnormalities of gait and mobility: Secondary | ICD-10-CM

## 2023-06-13 DIAGNOSIS — M6281 Muscle weakness (generalized): Secondary | ICD-10-CM

## 2023-06-13 NOTE — Therapy (Signed)
OUTPATIENT PHYSICAL THERAPY TREATMENT   Patient Name: Anthony Park MRN: [id number] DOB:1960/12/15, 62 y.o., male Today's Date: 06/13/2023  END OF SESSION:  PT End of Session - 06/13/23 0844     Visit Number 16    Number of Visits 20    Date for PT Re-Evaluation 07/11/23    PT Start Time 0845    PT Stop Time 0930    PT Time Calculation (min) 45 min    Activity Tolerance Patient tolerated treatment well    Behavior During Therapy [facility] for tasks assessed/performed             Past Medical History:  Diagnosis Date   Arthritis    Diabetes mellitus without complication (HCC)    type 2 controlled with diet    Diabetic toe ulcer (HCC) 11/05/2018   History of kidney stones    passed 1 several years ago   Hyperlipidemia    Hypertension    not on medications   Peripheral vascular disease (HCC)    stent in left leg due to anerysm    Past Surgical History:  Procedure Laterality Date    Left Transmetatarsal Ampuation (Left Foot)  08/20/2020   ABDOMINAL AORTOGRAM W/LOWER EXTREMITY N/A 08/06/2020   Procedure: ABDOMINAL AORTOGRAM W/LOWER EXTREMITY;  Surgeon: [name], MD;  Location: [facility];  Service: Cardiovascular;  Laterality: N/A;   AMPUTATION Right 11/07/2018   Procedure: RIGHT 3RD TOE AMPUTATION;  Surgeon: Nadara Mustard, MD;  Location: [facility] OR;  Service: Orthopedics;  Laterality: Right;   AMPUTATION Left 08/20/2020   Procedure: Left Transmetatarsal Ampuation;  Surgeon: [name], MD;  Location: [facility] OR;  Service: Vascular;  Laterality: Left;   AMPUTATION Left 10/03/2020   Procedure: LEFT BELOW KNEE AMPUTATION;  Surgeon: [name], MD;  Location: [facility] OR;  Service: Vascular;  Laterality: Left;   AMPUTATION TOE Left    big toe and one next to it   CATARACT EXTRACTION Bilateral    EYE SURGERY Bilateral    cataracts removed   INSERTION OF ILIAC STENT Left    PERIPHERAL VASCULAR BALLOON ANGIOPLASTY Left 08/06/2020   Procedure:  PERIPHERAL VASCULAR BALLOON ANGIOPLASTY;  Surgeon: [name], MD;  Location: [facility];  Service: Cardiovascular;  Laterality: Left;  Peroneal artery.   TOE AMPUTATION Left 2015   TOTAL HIP ARTHROPLASTY Left 05/23/2020   Procedure: LEFT TOTAL HIP ARTHROPLASTY ANTERIOR APPROACH;  Surgeon: Kathryne Hitch, MD;  Location: [facility];  Service: Orthopedics;  Laterality: Left;   TOTAL HIP ARTHROPLASTY Right 07/31/2021   Procedure: RIGHT TOTAL HIP ARTHROPLASTY ANTERIOR APPROACH;  Surgeon: Kathryne Hitch, MD;  Location: [facility];  Service: Orthopedics;  Laterality: Right;   WOUND DEBRIDEMENT Left 09/11/2020   Procedure: DEBRIDEMENT OF LEFT TRANSMETATARSAL WOUND;  Surgeon: Leonie Douglas, MD;  Location: [facility] OR;  Service: Vascular;  Laterality: Left;   Patient Active Problem List   Diagnosis Date Noted   Hypogonadism, male 01/19/2023   Erectile dysfunction 01/19/2023   Aneurysm of abdominal vessel (HCC) 01/19/2023   Chronic kidney disease, stage 3a (HCC) 01/19/2023   AAA (abdominal aortic aneurysm) without rupture (HCC) 01/18/2023   S/P BKA (below knee amputation) unilateral, left (HCC) 10/03/2020   Non-healing wound of lower extremity 10/03/2020   History of amputation of left foot through metatarsal bone (HCC) 09/11/2020   PAD (peripheral artery disease) (HCC) 08/20/2020   Status post total replacement of left hip 06/05/2020   Unilateral primary osteoarthritis, right  hip 04/17/2020   Unilateral primary osteoarthritis, left hip 04/17/2020   Amputated toe, right (HCC) 11/22/2018   Type 2 diabetes mellitus with foot ulcer, without long-term current use of insulin (HCC) 11/22/2018   Mixed hyperlipidemia 11/22/2018   Aneurysm of left popliteal artery (HCC) 11/22/2018   Status post peripheral artery angioplasty with insertion of stent 11/22/2018   Osteomyelitis of third toe of right foot (HCC)    Cellulitis of third toe of right foot     PCP: Melida Quitter, MD    REFERRING PROVIDER:   Adonis Huguenin, NP    REFERRING DIAG: [phone] (ICD-10-CM) - Below-knee amputation of left lower extremity, subsequent encounter (HCC)   THERAPY DIAG:  Pain in left lower leg  Muscle weakness (generalized)  Other abnormalities of gait and mobility  Rationale for Evaluation and Treatment: Rehabilitation  ONSET DATE: 6 week onset of left residual limb pain  SUBJECTIVE:   SUBJECTIVE STATEMENT: He continues to relay improvements in his overall activity and pain  PERTINENT HISTORY: Rt THA (anterior approach) 07/31/21. Left TTTA, left THA (05/23/20), arthritis, DM2, HTN, HLD, PVD PAIN:  Are you having pain? Yes: NPRS scale: 2 upon arrival today/10 Pain location: left distal and lateral residual limb at tibialis anterior and calf Pain description: fatigue, cramps, also reports phantom  Aggravating factors: wearing prosthesis too long sometimes, sometimes just has it at rest Relieving factors: taking off prosthesis  PRECAUTIONS: None  RED FLAGS: None   WEIGHT BEARING RESTRICTIONS: No  FALLS:  Has patient fallen in last 6 months? No   PATIENT GOALS: reduce pain in his leg  NEXT MD VISIT: nothing scheduled at eval  OBJECTIVE:   DIAGNOSTIC FINDINGS:   PATIENT SURVEYS:  FOTO not done, not a good category to place him in so will defer this  COGNITION: Overall cognitive status: Within functional limits for tasks assessed     SENSATION: Light touch: WFL   EDEMA:  Mild edema noted in distal legs bilat  PALPATION: Tender to palpation in left tibialis anterior and gastroc  LOWER EXTREMITY ROM:  Active ROM Left 05/03/23   Hip flexion    Hip extension    Hip abduction    Hip adduction    Hip internal rotation    Hip external rotation    Knee flexion WFL   Knee extension WFL   Ankle dorsiflexion    Ankle plantarflexion    Ankle inversion    Ankle eversion     (Blank rows = not tested)  LOWER EXTREMITY MMT:  MMT Right eval  Left eval  Hip flexion  WFL  Hip extension    Hip abduction    Hip adduction    Hip internal rotation    Hip external rotation    Knee flexion  WFL  Knee extension  WNL  Ankle dorsiflexion    Ankle plantarflexion    Ankle inversion    Ankle eversion     (Blank rows = not tested)    FUNCTIONAL TESTS:    GAIT: Comments: WFL using prosthesis on left, he is independent community Ambulator but reports fatigue at times    TODAY'S TREATMENT:  06/13/23 Manual therapy for STM and  as well as active compression and skilled palpation and Trigger Point Dry-Needling  Treatment instructions: Expect mild to moderate muscle soreness. Patient Consent Given: Yes Education handout provided: verbally provided Muscles treated: Left tibialis anterior as well as gastroc also combined with Estim  Electrical stimulation provided: Yes milliamp current at frequency  2-50, with intensity as tolerated 2-4 Treatment response/outcome: good overall tolerance,twitch response noted    PATIENT EDUCATION: Education details: HEP, PT plan of care, self care education provided for phantom sensations, see above for details.  Person educated: Patient Education method: Programmer, multimedia, Demonstration, Verbal cues, and Handouts Education comprehension: verbalized understanding and needs further education   HOME EXERCISE PROGRAM:   ASSESSMENT:  CLINICAL IMPRESSION: We are relying on his subjective testimony to track progress based on his pain ratings and how long he can stand to perform ADL's before having to sit and take his prosthesis off. He continues to report improvement in function as well as decreased phantom pain and phantom sensations so he will continue  to benefit from PT.  OBJECTIVE IMPAIRMENTS: decreased activity tolerance, difficulty walking, decreased balance, decreased endurance, decreased mobility, decreased strength, LE use, postural dysfunction, and pain.  ACTIVITY LIMITATIONS: bending, lifting,  carry, locomotion, cleaning, community activity, driving  PERSONAL FACTORS: Rt THA (anterior approach) 07/31/21. Left TTTA, left THA (05/23/20), arthritis, DM2, HTN, HLD, PVD are also affecting patient's functional outcome.  REHAB POTENTIAL: Good  CLINICAL DECISION MAKING: Stable/uncomplicated  EVALUATION COMPLEXITY: Low    GOALS: Short term PT Goals Target date: 04/27/2023   Pt will be I and compliant with self care recommendations and name 3 ways to manage phantom sensation/pain.  Baseline:  Goal status: MET 05/09/23 Pt will decrease pain by 25% overall Baseline: Goal status: MET 05/09/23  Long term PT goals Target date:07/11/2023  Pt will reduce pain and phantom sensations by overall 50% Baseline: Goal status: ongoing,  Pt will be able to ambulate community distances at least 400 ft WFL gait pattern with less complaints of residual limb pain. Baseline: Goal status: ongoing 05/09/23  PLAN: PT FREQUENCY: 1-2 times per week   PT DURATION: 4-6 weeks  PLANNED INTERVENTIONS (unless contraindicated): aquatic PT, prosthetic training, Canalith repositioning, cryotherapy, Electrical stimulation, Iontophoresis with 4 mg/ml dexamethasome, Moist heat, traction, Ultrasound, gait training, Therapeutic exercise, balance training, neuromuscular re-education, patient/family education, prosthetic training, manual techniques, passive ROM, dry needling, taping, vasopnuematic device, vestibular, spinal manipulations, joint manipulations  PLAN FOR NEXT SESSION: continue with nerve desensitization techniques. DN. Using subjective testimony to track progress as not able to provide any objective measurements as he has good ROM, strength, balance, and understanding of how to use his prosthesis so we are focusing on pain control to improve standing tolerance with prosthesis.    April Manson, PT,DPT 06/13/2023, 8:45 AM

## 2023-06-15 ENCOUNTER — Ambulatory Visit (INDEPENDENT_AMBULATORY_CARE_PROVIDER_SITE_OTHER): Payer: BC Managed Care – PPO | Admitting: Physical Therapy

## 2023-06-15 ENCOUNTER — Encounter: Payer: Self-pay | Admitting: Physical Therapy

## 2023-06-15 DIAGNOSIS — M6281 Muscle weakness (generalized): Secondary | ICD-10-CM

## 2023-06-15 DIAGNOSIS — M79662 Pain in left lower leg: Secondary | ICD-10-CM

## 2023-06-15 DIAGNOSIS — R2689 Other abnormalities of gait and mobility: Secondary | ICD-10-CM

## 2023-06-15 DIAGNOSIS — M25551 Pain in right hip: Secondary | ICD-10-CM | POA: Diagnosis not present

## 2023-06-15 NOTE — Therapy (Signed)
OUTPATIENT PHYSICAL THERAPY TREATMENT   Patient Name: Anthony Park MRN: [id number] DOB:1961/07/03, 62 y.o., male Today's Date: 06/15/2023  END OF SESSION:  PT End of Session - 06/15/23 0940     Visit Number 17    Number of Visits 20    Date for PT Re-Evaluation 07/11/23    PT Start Time 0845    PT Stop Time 0930    PT Time Calculation (min) 45 min    Activity Tolerance Patient tolerated treatment well    Behavior During Therapy [facility] for tasks assessed/performed             Past Medical History:  Diagnosis Date   Arthritis    Diabetes mellitus without complication (HCC)    type 2 controlled with diet    Diabetic toe ulcer (HCC) 11/05/2018   History of kidney stones    passed 1 several years ago   Hyperlipidemia    Hypertension    not on medications   Peripheral vascular disease (HCC)    stent in left leg due to anerysm    Past Surgical History:  Procedure Laterality Date    Left Transmetatarsal Ampuation (Left Foot)  08/20/2020   ABDOMINAL AORTOGRAM W/LOWER EXTREMITY N/A 08/06/2020   Procedure: ABDOMINAL AORTOGRAM W/LOWER EXTREMITY;  Surgeon: [name], MD;  Location: [facility];  Service: Cardiovascular;  Laterality: N/A;   AMPUTATION Right 11/07/2018   Procedure: RIGHT 3RD TOE AMPUTATION;  Surgeon: Nadara Mustard, MD;  Location: [facility] OR;  Service: Orthopedics;  Laterality: Right;   AMPUTATION Left 08/20/2020   Procedure: Left Transmetatarsal Ampuation;  Surgeon: [name], MD;  Location: [facility] OR;  Service: Vascular;  Laterality: Left;   AMPUTATION Left 10/03/2020   Procedure: LEFT BELOW KNEE AMPUTATION;  Surgeon: [name], MD;  Location: [facility] OR;  Service: Vascular;  Laterality: Left;   AMPUTATION TOE Left    big toe and one next to it   CATARACT EXTRACTION Bilateral    EYE SURGERY Bilateral    cataracts removed   INSERTION OF ILIAC STENT Left    PERIPHERAL VASCULAR BALLOON ANGIOPLASTY Left 08/06/2020   Procedure:  PERIPHERAL VASCULAR BALLOON ANGIOPLASTY;  Surgeon: [name], MD;  Location: [facility];  Service: Cardiovascular;  Laterality: Left;  Peroneal artery.   TOE AMPUTATION Left 2015   TOTAL HIP ARTHROPLASTY Left 05/23/2020   Procedure: LEFT TOTAL HIP ARTHROPLASTY ANTERIOR APPROACH;  Surgeon: Kathryne Hitch, MD;  Location: [facility];  Service: Orthopedics;  Laterality: Left;   TOTAL HIP ARTHROPLASTY Right 07/31/2021   Procedure: RIGHT TOTAL HIP ARTHROPLASTY ANTERIOR APPROACH;  Surgeon: Kathryne Hitch, MD;  Location: [facility];  Service: Orthopedics;  Laterality: Right;   WOUND DEBRIDEMENT Left 09/11/2020   Procedure: DEBRIDEMENT OF LEFT TRANSMETATARSAL WOUND;  Surgeon: Leonie Douglas, MD;  Location: [facility] OR;  Service: Vascular;  Laterality: Left;   Patient Active Problem List   Diagnosis Date Noted   Hypogonadism, male 01/19/2023   Erectile dysfunction 01/19/2023   Aneurysm of abdominal vessel (HCC) 01/19/2023   Chronic kidney disease, stage 3a (HCC) 01/19/2023   AAA (abdominal aortic aneurysm) without rupture (HCC) 01/18/2023   S/P BKA (below knee amputation) unilateral, left (HCC) 10/03/2020   Non-healing wound of lower extremity 10/03/2020   History of amputation of left foot through metatarsal bone (HCC) 09/11/2020   PAD (peripheral artery disease) (HCC) 08/20/2020   Status post total replacement of left hip 06/05/2020   Unilateral primary osteoarthritis, right  hip 04/17/2020   Unilateral primary osteoarthritis, left hip 04/17/2020   Amputated toe, right (HCC) 11/22/2018   Type 2 diabetes mellitus with foot ulcer, without long-term current use of insulin (HCC) 11/22/2018   Mixed hyperlipidemia 11/22/2018   Aneurysm of left popliteal artery (HCC) 11/22/2018   Status post peripheral artery angioplasty with insertion of stent 11/22/2018   Osteomyelitis of third toe of right foot (HCC)    Cellulitis of third toe of right foot     PCP: Melida Quitter, MD    REFERRING PROVIDER:   Adonis Huguenin, NP    REFERRING DIAG: [phone] (ICD-10-CM) - Below-knee amputation of left lower extremity, subsequent encounter (HCC)   THERAPY DIAG:  Pain in left lower leg  Muscle weakness (generalized)  Other abnormalities of gait and mobility  Pain in right hip  Rationale for Evaluation and Treatment: Rehabilitation  ONSET DATE: 6 week onset of left residual limb pain  SUBJECTIVE:   SUBJECTIVE STATEMENT: He relays the limb is feeling overall better today but still feeling some cramping phantom sensations at night  PERTINENT HISTORY: Rt THA (anterior approach) 07/31/21. Left TTTA, left THA (05/23/20), arthritis, DM2, HTN, HLD, PVD PAIN:  Are you having pain? Yes: NPRS scale: 1 upon arrival today/10 Pain location: left distal and lateral residual limb at tibialis anterior and calf Pain description: fatigue, cramps, also reports phantom  Aggravating factors: wearing prosthesis too long sometimes, sometimes just has it at rest Relieving factors: taking off prosthesis  PRECAUTIONS: None  RED FLAGS: None   WEIGHT BEARING RESTRICTIONS: No  FALLS:  Has patient fallen in last 6 months? No   PATIENT GOALS: reduce pain in his leg  NEXT MD VISIT: nothing scheduled at eval  OBJECTIVE:   PATIENT SURVEYS:  FOTO not done, not a good category to place him in so will defer this  COGNITION: Overall cognitive status: Within functional limits for tasks assessed     SENSATION: Light touch: WFL   EDEMA:  Mild edema noted in distal legs bilat  PALPATION: Tender to palpation in left tibialis anterior and gastroc  LOWER EXTREMITY ROM:  Active ROM Left 05/03/23   Hip flexion    Hip extension    Hip abduction    Hip adduction    Hip internal rotation    Hip external rotation    Knee flexion WFL   Knee extension WFL   Ankle dorsiflexion    Ankle plantarflexion    Ankle inversion    Ankle eversion     (Blank rows = not tested)  LOWER  EXTREMITY MMT:  MMT Right eval Left eval  Hip flexion  WFL  Hip extension    Hip abduction    Hip adduction    Hip internal rotation    Hip external rotation    Knee flexion  WFL  Knee extension  WNL  Ankle dorsiflexion    Ankle plantarflexion    Ankle inversion    Ankle eversion     (Blank rows = not tested)    FUNCTIONAL TESTS:    GAIT: Comments: WFL using prosthesis on left, he is independent community Ambulator but reports fatigue at times    TODAY'S TREATMENT:  06/15/23 Manual therapy for STM and  as well as active compression and skilled palpation and Trigger Point Dry-Needling  Treatment instructions: Expect mild to moderate muscle soreness. Patient Consent Given: Yes Education handout provided: verbally provided Muscles treated: Left tibialis anterior as well as gastroc also combined with Estim  Electrical stimulation provided: Yes milliamp current at frequency 2-50, with intensity as tolerated 2-4 Treatment response/outcome: good overall tolerance,twitch response noted    PATIENT EDUCATION: Education details: HEP, PT plan of care, self care education provided for phantom sensations, see above for details.  Person educated: Patient Education method: Explanation, Demonstration, Verbal cues, and Handouts Education comprehension: verbalized understanding and needs further education   HOME EXERCISE PROGRAM:   ASSESSMENT:  CLINICAL IMPRESSION: He had some pain and phantom sensations after increased activity including climbing ladder. This resolved after DN treatment combined with Estim as well as manual therapy. He has been able to show improved activity tolerance to wearing prosthesis for standing activity and ADL's but still overall limited by this and will continue to benefit from PT.  OBJECTIVE IMPAIRMENTS: decreased activity tolerance, difficulty walking, decreased balance, decreased endurance, decreased mobility, decreased strength, LE use, postural  dysfunction, and pain.  ACTIVITY LIMITATIONS: bending, lifting, carry, locomotion, cleaning, community activity, driving  PERSONAL FACTORS: Rt THA (anterior approach) 07/31/21. Left TTTA, left THA (05/23/20), arthritis, DM2, HTN, HLD, PVD are also affecting patient's functional outcome.  REHAB POTENTIAL: Good  CLINICAL DECISION MAKING: Stable/uncomplicated  EVALUATION COMPLEXITY: Low    GOALS: Short term PT Goals Target date: 04/27/2023   Pt will be I and compliant with self care recommendations and name 3 ways to manage phantom sensation/pain.  Baseline:  Goal status: MET 05/09/23 Pt will decrease pain by 25% overall Baseline: Goal status: MET 05/09/23  Long term PT goals Target date:07/11/2023  Pt will reduce pain and phantom sensations by overall 50% Baseline: Goal status: ongoing,  Pt will be able to ambulate community distances at least 400 ft WFL gait pattern with less complaints of residual limb pain. Baseline: Goal status: ongoing 05/09/23  PLAN: PT FREQUENCY: 1-2 times per week   PT DURATION: 4-6 weeks  PLANNED INTERVENTIONS (unless contraindicated): aquatic PT, prosthetic training, Canalith repositioning, cryotherapy, Electrical stimulation, Iontophoresis with 4 mg/ml dexamethasome, Moist heat, traction, Ultrasound, gait training, Therapeutic exercise, balance training, neuromuscular re-education, patient/family education, prosthetic training, manual techniques, passive ROM, dry needling, taping, vasopnuematic device, vestibular, spinal manipulations, joint manipulations  PLAN FOR NEXT SESSION: continue with nerve desensitization techniques. DN. Using subjective testimony to track progress as not able to provide any objective measurements as he has good ROM, strength, balance, and understanding of how to use his prosthesis so we are focusing on pain control to improve standing tolerance with prosthesis.    April Manson, PT,DPT 06/15/2023, 9:41 AM

## 2023-06-20 ENCOUNTER — Encounter: Payer: Self-pay | Admitting: Physical Therapy

## 2023-06-20 ENCOUNTER — Ambulatory Visit (INDEPENDENT_AMBULATORY_CARE_PROVIDER_SITE_OTHER): Payer: BC Managed Care – PPO | Admitting: Physical Therapy

## 2023-06-20 DIAGNOSIS — M79662 Pain in left lower leg: Secondary | ICD-10-CM

## 2023-06-20 DIAGNOSIS — R2689 Other abnormalities of gait and mobility: Secondary | ICD-10-CM | POA: Diagnosis not present

## 2023-06-20 DIAGNOSIS — M25551 Pain in right hip: Secondary | ICD-10-CM

## 2023-06-20 DIAGNOSIS — M6281 Muscle weakness (generalized): Secondary | ICD-10-CM

## 2023-06-20 NOTE — Therapy (Signed)
OUTPATIENT PHYSICAL THERAPY TREATMENT   Patient Name: Anthony Park MRN: [id number] DOB:01-Aug-1961, 62 y.o., male Today's Date: 06/20/2023  END OF SESSION:  PT End of Session - 06/20/23 0857     Visit Number 18    Number of Visits 20    Date for PT Re-Evaluation 07/11/23    PT Start Time 0845    PT Stop Time 0930    PT Time Calculation (min) 45 min    Activity Tolerance Patient tolerated treatment well    Behavior During Therapy [facility] for tasks assessed/performed             Past Medical History:  Diagnosis Date   Arthritis    Diabetes mellitus without complication (HCC)    type 2 controlled with diet    Diabetic toe ulcer (HCC) 11/05/2018   History of kidney stones    passed 1 several years ago   Hyperlipidemia    Hypertension    not on medications   Peripheral vascular disease (HCC)    stent in left leg due to anerysm    Past Surgical History:  Procedure Laterality Date    Left Transmetatarsal Ampuation (Left Foot)  08/20/2020   ABDOMINAL AORTOGRAM W/LOWER EXTREMITY N/A 08/06/2020   Procedure: ABDOMINAL AORTOGRAM W/LOWER EXTREMITY;  Surgeon: [name], MD;  Location: [facility];  Service: Cardiovascular;  Laterality: N/A;   AMPUTATION Right 11/07/2018   Procedure: RIGHT 3RD TOE AMPUTATION;  Surgeon: Nadara Mustard, MD;  Location: [facility] OR;  Service: Orthopedics;  Laterality: Right;   AMPUTATION Left 08/20/2020   Procedure: Left Transmetatarsal Ampuation;  Surgeon: [name], MD;  Location: [facility] OR;  Service: Vascular;  Laterality: Left;   AMPUTATION Left 10/03/2020   Procedure: LEFT BELOW KNEE AMPUTATION;  Surgeon: [name], MD;  Location: [facility] OR;  Service: Vascular;  Laterality: Left;   AMPUTATION TOE Left    big toe and one next to it   CATARACT EXTRACTION Bilateral    EYE SURGERY Bilateral    cataracts removed   INSERTION OF ILIAC STENT Left    PERIPHERAL VASCULAR BALLOON ANGIOPLASTY Left 08/06/2020   Procedure:  PERIPHERAL VASCULAR BALLOON ANGIOPLASTY;  Surgeon: [name], MD;  Location: [facility];  Service: Cardiovascular;  Laterality: Left;  Peroneal artery.   TOE AMPUTATION Left 2015   TOTAL HIP ARTHROPLASTY Left 05/23/2020   Procedure: LEFT TOTAL HIP ARTHROPLASTY ANTERIOR APPROACH;  Surgeon: Kathryne Hitch, MD;  Location: [facility];  Service: Orthopedics;  Laterality: Left;   TOTAL HIP ARTHROPLASTY Right 07/31/2021   Procedure: RIGHT TOTAL HIP ARTHROPLASTY ANTERIOR APPROACH;  Surgeon: Kathryne Hitch, MD;  Location: [facility];  Service: Orthopedics;  Laterality: Right;   WOUND DEBRIDEMENT Left 09/11/2020   Procedure: DEBRIDEMENT OF LEFT TRANSMETATARSAL WOUND;  Surgeon: Leonie Douglas, MD;  Location: [facility] OR;  Service: Vascular;  Laterality: Left;   Patient Active Problem List   Diagnosis Date Noted   Hypogonadism, male 01/19/2023   Erectile dysfunction 01/19/2023   Aneurysm of abdominal vessel (HCC) 01/19/2023   Chronic kidney disease, stage 3a (HCC) 01/19/2023   AAA (abdominal aortic aneurysm) without rupture (HCC) 01/18/2023   S/P BKA (below knee amputation) unilateral, left (HCC) 10/03/2020   Non-healing wound of lower extremity 10/03/2020   History of amputation of left foot through metatarsal bone (HCC) 09/11/2020   PAD (peripheral artery disease) (HCC) 08/20/2020   Status post total replacement of left hip 06/05/2020   Unilateral primary osteoarthritis, right  hip 04/17/2020   Unilateral primary osteoarthritis, left hip 04/17/2020   Amputated toe, right (HCC) 11/22/2018   Type 2 diabetes mellitus with foot ulcer, without long-term current use of insulin (HCC) 11/22/2018   Mixed hyperlipidemia 11/22/2018   Aneurysm of left popliteal artery (HCC) 11/22/2018   Status post peripheral artery angioplasty with insertion of stent 11/22/2018   Osteomyelitis of third toe of right foot (HCC)    Cellulitis of third toe of right foot     PCP: Melida Quitter, MD    REFERRING PROVIDER:   Adonis Huguenin, NP    REFERRING DIAG: [id number] (ICD-10-CM) - Below-knee amputation of left lower extremity, subsequent encounter (HCC)   THERAPY DIAG:  Pain in left lower leg  Muscle weakness (generalized)  Other abnormalities of gait and mobility  Pain in right hip  Rationale for Evaluation and Treatment: Rehabilitation  ONSET DATE: 6 week onset of left residual limb pain  SUBJECTIVE:   SUBJECTIVE STATEMENT: He relays some soreness and phantom pains but not bad today.  He has been doing his self care massage techniques at home which seem to be helping. He is able to stand longer but had 2 times this weekend where his residual limb caused an intense thumping pain with phantom sensations in his toes.  PERTINENT HISTORY: Rt THA (anterior approach) 07/31/21. Left TTTA, left THA (05/23/20), arthritis, DM2, HTN, HLD, PVD PAIN:  Are you having pain? Yes: NPRS scale: 1 upon arrival today/10 Pain location: left distal and lateral residual limb at tibialis anterior and calf Pain description: fatigue, cramps, also reports phantom  Aggravating factors: wearing prosthesis too long sometimes, sometimes just has it at rest Relieving factors: taking off prosthesis  PRECAUTIONS: None  RED FLAGS: None   WEIGHT BEARING RESTRICTIONS: No  FALLS:  Has patient fallen in last 6 months? No   PATIENT GOALS: reduce pain in his leg  NEXT MD VISIT: nothing scheduled at eval  OBJECTIVE:   PATIENT SURVEYS:  FOTO not done, not a good category to place him in so will defer this  COGNITION: Overall cognitive status: Within functional limits for tasks assessed     SENSATION: Light touch: WFL   EDEMA:  Mild edema noted in distal legs bilat  PALPATION: Tender to palpation in left tibialis anterior and gastroc  LOWER EXTREMITY ROM:  Active ROM Left 05/03/23   Hip flexion    Hip extension    Hip abduction    Hip adduction    Hip internal rotation    Hip  external rotation    Knee flexion WFL   Knee extension WFL   Ankle dorsiflexion    Ankle plantarflexion    Ankle inversion    Ankle eversion     (Blank rows = not tested)  LOWER EXTREMITY MMT:  MMT Right eval Left eval  Hip flexion  WFL  Hip extension    Hip abduction    Hip adduction    Hip internal rotation    Hip external rotation    Knee flexion  WFL  Knee extension  WNL  Ankle dorsiflexion    Ankle plantarflexion    Ankle inversion    Ankle eversion     (Blank rows = not tested)    FUNCTIONAL TESTS:    GAIT: Comments: WFL using prosthesis on left, he is independent community Ambulator but reports fatigue at times    TODAY'S TREATMENT:  06/20/23 Manual therapy for STM and  as well as active compression and skilled  palpation and Trigger Point Dry-Needling  Treatment instructions: Expect mild to moderate muscle soreness. Patient Consent Given: Yes Education handout provided: verbally provided Muscles treated: Left tibialis anterior as well as gastroc also combined with Estim  Electrical stimulation provided: Yes milliamp current at frequency 2-50, with intensity as tolerated 2-4 Treatment response/outcome: good overall tolerance,twitch response noted    PATIENT EDUCATION: Education details: HEP, PT plan of care, self care education provided for phantom sensations, see above for details.  Person educated: Patient Education method: Explanation, Demonstration, Verbal cues, and Handouts Education comprehension: verbalized understanding and needs further education   HOME EXERCISE PROGRAM:   ASSESSMENT:  CLINICAL IMPRESSION: He has been doing overall better over last weeks. He has 2 more visits left scheduled within his current PT plan of care and we will plan to transition to independent program after that.  OBJECTIVE IMPAIRMENTS: decreased activity tolerance, difficulty walking, decreased balance, decreased endurance, decreased mobility, decreased strength,  LE use, postural dysfunction, and pain.  ACTIVITY LIMITATIONS: bending, lifting, carry, locomotion, cleaning, community activity, driving  PERSONAL FACTORS: Rt THA (anterior approach) 07/31/21. Left TTTA, left THA (05/23/20), arthritis, DM2, HTN, HLD, PVD are also affecting patient's functional outcome.  REHAB POTENTIAL: Good  CLINICAL DECISION MAKING: Stable/uncomplicated  EVALUATION COMPLEXITY: Low    GOALS: Short term PT Goals Target date: 04/27/2023   Pt will be I and compliant with self care recommendations and name 3 ways to manage phantom sensation/pain.  Baseline:  Goal status: MET 05/09/23 Pt will decrease pain by 25% overall Baseline: Goal status: MET 05/09/23  Long term PT goals Target date:07/11/2023  Pt will reduce pain and phantom sensations by overall 50% Baseline: Goal status: ongoing,  Pt will be able to ambulate community distances at least 400 ft WFL gait pattern with less complaints of residual limb pain. Baseline: Goal status: ongoing 05/09/23  PLAN: PT FREQUENCY: 1-2 times per week   PT DURATION: 4-6 weeks  PLANNED INTERVENTIONS (unless contraindicated): aquatic PT, prosthetic training, Canalith repositioning, cryotherapy, Electrical stimulation, Iontophoresis with 4 mg/ml dexamethasome, Moist heat, traction, Ultrasound, gait training, Therapeutic exercise, balance training, neuromuscular re-education, patient/family education, prosthetic training, manual techniques, passive ROM, dry needling, taping, vasopnuematic device, vestibular, spinal manipulations, joint manipulations  PLAN FOR NEXT SESSION: continue with nerve desensitization techniques. DN. Using subjective testimony to track progress as not able to provide any objective measurements as he has good ROM, strength, balance, and understanding of how to use his prosthesis so we are focusing on pain control to improve standing tolerance with prosthesis.    April Manson, PT,DPT 06/20/2023, 8:58  AM

## 2023-06-22 ENCOUNTER — Ambulatory Visit (INDEPENDENT_AMBULATORY_CARE_PROVIDER_SITE_OTHER): Payer: BC Managed Care – PPO | Admitting: Physical Therapy

## 2023-06-22 ENCOUNTER — Encounter: Payer: Self-pay | Admitting: Physical Therapy

## 2023-06-22 DIAGNOSIS — M79662 Pain in left lower leg: Secondary | ICD-10-CM

## 2023-06-22 DIAGNOSIS — M6281 Muscle weakness (generalized): Secondary | ICD-10-CM

## 2023-06-22 DIAGNOSIS — R2689 Other abnormalities of gait and mobility: Secondary | ICD-10-CM

## 2023-06-22 DIAGNOSIS — M25551 Pain in right hip: Secondary | ICD-10-CM

## 2023-06-22 NOTE — Therapy (Signed)
OUTPATIENT PHYSICAL THERAPY TREATMENT   Patient Name: Anthony Park MRN: [id number] DOB:November 15, 1960, 62 y.o., male Today's Date: 06/22/2023  END OF SESSION:  PT End of Session - 06/22/23 0858     Visit Number 19    Number of Visits 20    Date for PT Re-Evaluation 07/11/23    PT Start Time 0846    PT Stop Time 0930    PT Time Calculation (min) 44 min    Activity Tolerance Patient tolerated treatment well    Behavior During Therapy [facility] for tasks assessed/performed             Past Medical History:  Diagnosis Date   Arthritis    Diabetes mellitus without complication (HCC)    type 2 controlled with diet    Diabetic toe ulcer (HCC) 11/05/2018   History of kidney stones    passed 1 several years ago   Hyperlipidemia    Hypertension    not on medications   Peripheral vascular disease (HCC)    stent in left leg due to anerysm    Past Surgical History:  Procedure Laterality Date    Left Transmetatarsal Ampuation (Left Foot)  08/20/2020   ABDOMINAL AORTOGRAM W/LOWER EXTREMITY N/A 08/06/2020   Procedure: ABDOMINAL AORTOGRAM W/LOWER EXTREMITY;  Surgeon: [name], MD;  Location: [facility];  Service: Cardiovascular;  Laterality: N/A;   AMPUTATION Right 11/07/2018   Procedure: RIGHT 3RD TOE AMPUTATION;  Surgeon: Nadara Mustard, MD;  Location: [facility] OR;  Service: Orthopedics;  Laterality: Right;   AMPUTATION Left 08/20/2020   Procedure: Left Transmetatarsal Ampuation;  Surgeon: [name], MD;  Location: [facility] OR;  Service: Vascular;  Laterality: Left;   AMPUTATION Left 10/03/2020   Procedure: LEFT BELOW KNEE AMPUTATION;  Surgeon: [name], MD;  Location: [facility] OR;  Service: Vascular;  Laterality: Left;   AMPUTATION TOE Left    big toe and one next to it   CATARACT EXTRACTION Bilateral    EYE SURGERY Bilateral    cataracts removed   INSERTION OF ILIAC STENT Left    PERIPHERAL VASCULAR BALLOON ANGIOPLASTY Left 08/06/2020   Procedure:  PERIPHERAL VASCULAR BALLOON ANGIOPLASTY;  Surgeon: [name], MD;  Location: [facility];  Service: Cardiovascular;  Laterality: Left;  Peroneal artery.   TOE AMPUTATION Left 2015   TOTAL HIP ARTHROPLASTY Left 05/23/2020   Procedure: LEFT TOTAL HIP ARTHROPLASTY ANTERIOR APPROACH;  Surgeon: Kathryne Hitch, MD;  Location: [facility];  Service: Orthopedics;  Laterality: Left;   TOTAL HIP ARTHROPLASTY Right 07/31/2021   Procedure: RIGHT TOTAL HIP ARTHROPLASTY ANTERIOR APPROACH;  Surgeon: Kathryne Hitch, MD;  Location: [facility];  Service: Orthopedics;  Laterality: Right;   WOUND DEBRIDEMENT Left 09/11/2020   Procedure: DEBRIDEMENT OF LEFT TRANSMETATARSAL WOUND;  Surgeon: Leonie Douglas, MD;  Location: [facility] OR;  Service: Vascular;  Laterality: Left;   Patient Active Problem List   Diagnosis Date Noted   Hypogonadism, male 01/19/2023   Erectile dysfunction 01/19/2023   Aneurysm of abdominal vessel (HCC) 01/19/2023   Chronic kidney disease, stage 3a (HCC) 01/19/2023   AAA (abdominal aortic aneurysm) without rupture (HCC) 01/18/2023   S/P BKA (below knee amputation) unilateral, left (HCC) 10/03/2020   Non-healing wound of lower extremity 10/03/2020   History of amputation of left foot through metatarsal bone (HCC) 09/11/2020   PAD (peripheral artery disease) (HCC) 08/20/2020   Status post total replacement of left hip 06/05/2020   Unilateral primary osteoarthritis, right  hip 04/17/2020   Unilateral primary osteoarthritis, left hip 04/17/2020   Amputated toe, right (HCC) 11/22/2018   Type 2 diabetes mellitus with foot ulcer, without long-term current use of insulin (HCC) 11/22/2018   Mixed hyperlipidemia 11/22/2018   Aneurysm of left popliteal artery (HCC) 11/22/2018   Status post peripheral artery angioplasty with insertion of stent 11/22/2018   Osteomyelitis of third toe of right foot (HCC)    Cellulitis of third toe of right foot     PCP: Melida Quitter, MD    REFERRING PROVIDER:   Adonis Huguenin, NP    REFERRING DIAG: [id number] (ICD-10-CM) - Below-knee amputation of left lower extremity, subsequent encounter (HCC)   THERAPY DIAG:  Pain in left lower leg  Muscle weakness (generalized)  Other abnormalities of gait and mobility  Pain in right hip  Rationale for Evaluation and Treatment: Rehabilitation  ONSET DATE: 6 week onset of left residual limb pain  SUBJECTIVE:   SUBJECTIVE STATEMENT: He is pleased overall with his overall pain reduction. He feels good today overall  PERTINENT HISTORY: Rt THA (anterior approach) 07/31/21. Left TTTA, left THA (05/23/20), arthritis, DM2, HTN, HLD, PVD PAIN:  Are you having pain? Yes: NPRS scale: 1 upon arrival today/10 Pain location: left distal and lateral residual limb at tibialis anterior and calf Pain description: fatigue, cramps, also reports phantom  Aggravating factors: wearing prosthesis too long sometimes, sometimes just has it at rest Relieving factors: taking off prosthesis  PRECAUTIONS: None  RED FLAGS: None   WEIGHT BEARING RESTRICTIONS: No  FALLS:  Has patient fallen in last 6 months? No   PATIENT GOALS: reduce pain in his leg  NEXT MD VISIT: nothing scheduled at eval  OBJECTIVE:   PATIENT SURVEYS:  FOTO not done, not a good category to place him in so will defer this  COGNITION: Overall cognitive status: Within functional limits for tasks assessed     SENSATION: Light touch: WFL   EDEMA:  Mild edema noted in distal legs bilat  PALPATION: Tender to palpation in left tibialis anterior and gastroc  LOWER EXTREMITY ROM:  Active ROM Left 05/03/23   Hip flexion    Hip extension    Hip abduction    Hip adduction    Hip internal rotation    Hip external rotation    Knee flexion WFL   Knee extension WFL   Ankle dorsiflexion    Ankle plantarflexion    Ankle inversion    Ankle eversion     (Blank rows = not tested)  LOWER EXTREMITY MMT:  MMT  Right eval Left eval  Hip flexion  WFL  Hip extension    Hip abduction    Hip adduction    Hip internal rotation    Hip external rotation    Knee flexion  WFL  Knee extension  WNL  Ankle dorsiflexion    Ankle plantarflexion    Ankle inversion    Ankle eversion     (Blank rows = not tested)    FUNCTIONAL TESTS:    GAIT: Comments: WFL using prosthesis on left, he is independent community Ambulator but 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 Patient Consent Given: Yes Education handout provided: verbally provided Muscles treated: Left tibialis anterior as well as gastroc also combined with Estim  Electrical stimulation provided: Yes  milliamp current at frequency 2-50, with intensity as tolerated 2-4 Treatment response/outcome: good overall tolerance,twitch response noted    PATIENT EDUCATION: Education details: HEP, PT plan of care, self care education provided for phantom sensations, see above for details.  Person educated: Patient Education method: Explanation, Demonstration, Verbal cues, and Handouts Education comprehension: verbalized understanding and needs further education   HOME EXERCISE PROGRAM:   ASSESSMENT:  CLINICAL IMPRESSION: He has one more visit and we will plan to transition to independent program after that. He shows good understanding of self care soft tissue techniques and nerve desensitization techniques to manage his pain and phantom sensations.   OBJECTIVE IMPAIRMENTS: decreased activity tolerance, difficulty walking, decreased balance, decreased endurance, decreased mobility, decreased strength, LE use, postural dysfunction, and pain.  ACTIVITY LIMITATIONS: bending, lifting, carry, locomotion, cleaning, community activity, driving  PERSONAL FACTORS: Rt  THA (anterior approach) 07/31/21. Left TTTA, left THA (05/23/20), arthritis, DM2, HTN, HLD, PVD are also affecting patient's functional outcome.  REHAB POTENTIAL: Good  CLINICAL DECISION MAKING: Stable/uncomplicated  EVALUATION COMPLEXITY: Low    GOALS: Short term PT Goals Target date: 04/27/2023   Pt will be I and compliant with self care recommendations and name 3 ways to manage phantom sensation/pain.  Baseline:  Goal status: MET 05/09/23 Pt will decrease pain by 25% overall Baseline: Goal status: MET 05/09/23  Long term PT goals Target date:07/11/2023  Pt will reduce pain and phantom sensations by overall 50% Baseline: Goal status: ongoing,  Pt will be able to ambulate community distances at least 400 ft WFL gait pattern with less complaints of residual limb pain. Baseline: Goal status: ongoing 05/09/23  PLAN: PT FREQUENCY: 1-2 times per week   PT DURATION: 4-6 weeks  PLANNED INTERVENTIONS (unless contraindicated): aquatic PT, prosthetic training, Canalith repositioning, cryotherapy, Electrical stimulation, Iontophoresis with 4 mg/ml dexamethasome, Moist heat, traction, Ultrasound, gait training, Therapeutic exercise, balance training, neuromuscular re-education, patient/family education, prosthetic training, manual techniques, passive ROM, dry needling, taping, vasopnuematic device, vestibular, spinal manipulations, joint manipulations  PLAN FOR NEXT SESSION: DC next visit.    April Manson, PT,DPT 06/22/2023, 9:00 AM

## 2023-07-04 ENCOUNTER — Encounter: Payer: Self-pay | Admitting: Physical Therapy

## 2023-07-04 ENCOUNTER — Ambulatory Visit (INDEPENDENT_AMBULATORY_CARE_PROVIDER_SITE_OTHER): Payer: BC Managed Care – PPO | Admitting: Physical Therapy

## 2023-07-04 DIAGNOSIS — M25551 Pain in right hip: Secondary | ICD-10-CM

## 2023-07-04 DIAGNOSIS — R2689 Other abnormalities of gait and mobility: Secondary | ICD-10-CM

## 2023-07-04 DIAGNOSIS — M79662 Pain in left lower leg: Secondary | ICD-10-CM | POA: Diagnosis not present

## 2023-07-04 DIAGNOSIS — M6281 Muscle weakness (generalized): Secondary | ICD-10-CM | POA: Diagnosis not present

## 2023-07-04 DIAGNOSIS — R2681 Unsteadiness on feet: Secondary | ICD-10-CM

## 2023-07-04 NOTE — Therapy (Signed)
OUTPATIENT PHYSICAL THERAPY TREATMENT and DISCHARGE PHYSICAL THERAPY DISCHARGE SUMMARY  Visits from Start of Care: 20  Current functional level related to goals / functional outcomes: See below   Remaining deficits: See below   Education / Equipment: HEP  Plan:  Patient goals were met. Patient is being discharged due to meeting treatment goals.      Patient Name: Anthony Park MRN: [id number] DOB:25-Aug-1961, 62 y.o., male Today's Date: 07/04/2023  END OF SESSION:  PT End of Session - 07/04/23 0938     Visit Number 20    Number of Visits 20    Date for PT Re-Evaluation 07/11/23    PT Start Time 0930    PT Stop Time 1010    PT Time Calculation (min) 40 min    Activity Tolerance Patient tolerated treatment well    Behavior During Therapy [facility] for tasks assessed/performed             Past Medical History:  Diagnosis Date   Arthritis    Diabetes mellitus without complication (HCC)    type 2 controlled with diet    Diabetic toe ulcer (HCC) 11/05/2018   History of kidney stones    passed 1 several years ago   Hyperlipidemia    Hypertension    not on medications   Peripheral vascular disease (HCC)    stent in left leg due to anerysm    Past Surgical History:  Procedure Laterality Date    Left Transmetatarsal Ampuation (Left Foot)  08/20/2020   ABDOMINAL AORTOGRAM W/LOWER EXTREMITY N/A 08/06/2020   Procedure: ABDOMINAL AORTOGRAM W/LOWER EXTREMITY;  Surgeon: [name], MD;  [facility];  Service: Cardiovascular;  Laterality: N/A;   AMPUTATION Right 11/07/2018   Procedure: RIGHT 3RD TOE AMPUTATION;  Surgeon: Nadara Mustard, MD;  Location: [facility] OR;  Service: Orthopedics;  Laterality: Right;   AMPUTATION Left 08/20/2020   Procedure: Left Transmetatarsal Ampuation;  Surgeon: [name], MD;  Location: [facility] OR;  Service: Vascular;  Laterality: Left;   AMPUTATION Left 10/03/2020   Procedure: LEFT BELOW KNEE AMPUTATION;  Surgeon:  [name], MD;  Location: [facility] OR;  Service: Vascular;  Laterality: Left;   AMPUTATION TOE Left    big toe and one next to it   CATARACT EXTRACTION Bilateral    EYE SURGERY Bilateral    cataracts removed   INSERTION OF ILIAC STENT Left    PERIPHERAL VASCULAR BALLOON ANGIOPLASTY Left 08/06/2020   Procedure: PERIPHERAL VASCULAR BALLOON ANGIOPLASTY;  Surgeon: [name], MD;  Location: [facility];  Service: Cardiovascular;  Laterality: Left;  Peroneal artery.   TOE AMPUTATION Left 2015   TOTAL HIP ARTHROPLASTY Left 05/23/2020   Procedure: LEFT TOTAL HIP ARTHROPLASTY ANTERIOR APPROACH;  Surgeon: Kathryne Hitch, MD;  Location: [facility];  Service: Orthopedics;  Laterality: Left;   TOTAL HIP ARTHROPLASTY Right 07/31/2021   Procedure: RIGHT TOTAL HIP ARTHROPLASTY ANTERIOR APPROACH;  Surgeon: Kathryne Hitch, MD;  Location: [facility] OR[facility];  Service: Orthopedics;  Laterality: Right;   WOUND DEBRIDEMENT Left 09/11/2020   Procedure: DEBRIDEMENT OF LEFT TRANSMETATARSAL WOUND;  Surgeon: Leonie Douglas, MD;  Location: [facility] OR;  Service: Vascular;  Laterality: Left;   Patient Active Problem List   Diagnosis Date Noted   Hypogonadism, male 01/19/2023   Erectile dysfunction 01/19/2023   Aneurysm of abdominal vessel (HCC) 01/19/2023   Chronic kidney disease, stage 3a (HCC) 01/19/2023   AAA (abdominal aortic aneurysm) without rupture (HCC) 01/18/2023  S/P BKA (below knee amputation) unilateral, left (HCC) 10/03/2020   Non-healing wound of lower extremity 10/03/2020   History of amputation of left foot through metatarsal bone (HCC) 09/11/2020   PAD (peripheral artery disease) (HCC) 08/20/2020   Status post total replacement of left hip 06/05/2020   Unilateral primary osteoarthritis, right hip 04/17/2020   Unilateral primary osteoarthritis, left hip 04/17/2020   Amputated toe, right (HCC) 11/22/2018   Type 2 diabetes mellitus with foot ulcer, without long-term current use  of insulin (HCC) 11/22/2018   Mixed hyperlipidemia 11/22/2018   Aneurysm of left popliteal artery (HCC) 11/22/2018   Status post peripheral artery angioplasty with insertion of stent 11/22/2018   Osteomyelitis of third toe of right foot (HCC)    Cellulitis of third toe of right foot     PCP: Melida Quitter, MD   REFERRING PROVIDER:   Adonis Huguenin, NP    REFERRING DIAG: [phone] (ICD-10-CM) - Below-knee amputation of left lower extremity, subsequent encounter (HCC)   THERAPY DIAG:  Pain in left lower leg  Muscle weakness (generalized)  Other abnormalities of gait and mobility  Pain in right hip  Unsteadiness on feet  Rationale for Evaluation and Treatment: Rehabilitation  ONSET DATE: 6 week onset of left residual limb pain  SUBJECTIVE:   SUBJECTIVE STATEMENT: he reports 65% improvement overall with his pain and phantom sensations, he feels ready to discharge to independent program.  PERTINENT HISTORY: Rt THA (anterior approach) 07/31/21. Left TTTA, left THA (05/23/20), arthritis, DM2, HTN, HLD, PVD PAIN:  Are you having pain? Yes: NPRS scale: 1 upon arrival today/10 Pain location: left distal and lateral residual limb at tibialis anterior and calf Pain description: fatigue, cramps, also reports phantom  Aggravating factors: wearing prosthesis too long sometimes, sometimes just has it at rest Relieving factors: taking off prosthesis  PRECAUTIONS: None  RED FLAGS: None   WEIGHT BEARING RESTRICTIONS: No  FALLS:  Has patient fallen in last 6 months? No   PATIENT GOALS: reduce pain in his leg  NEXT MD VISIT: nothing scheduled at eval  OBJECTIVE:   PATIENT SURVEYS:  FOTO not done, not a good category to place him in so will defer this  COGNITION: Overall cognitive status: Within functional limits for tasks assessed     SENSATION: Light touch: WFL   EDEMA:  Mild edema noted in distal legs bilat  PALPATION: Tender to palpation in left tibialis  anterior and gastroc  LOWER EXTREMITY ROM:  Active ROM Left 05/03/23   Hip flexion    Hip extension    Hip abduction    Hip adduction    Hip internal rotation    Hip external rotation    Knee flexion WFL   Knee extension WFL   Ankle dorsiflexion    Ankle plantarflexion    Ankle inversion    Ankle eversion     (Blank rows = not tested)  LOWER EXTREMITY MMT:  MMT Right eval Left eval Rt/Lt 07/04/23  Hip flexion  WFL WNL/WNL  Hip extension     Hip abduction   WNL/WNL  Hip adduction     Hip internal rotation     Hip external rotation     Knee flexion  WFL WNL/WNL  Knee extension  WNL WNL/WNL  Ankle dorsiflexion     Ankle plantarflexion     Ankle inversion     Ankle eversion      (Blank rows = not tested)   GAIT: Comments: WFL using prosthesis on left,  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 Patient Consent Given: Yes Education handout provided: verbally provided Muscles treated: Left tibialis anterior as well as gastroc also combined with Estim  Electrical stimulation provided: Yes milliamp current at frequency 2-50, with intensity as tolerated 2-4 Treatment response/outcome: good overall tolerance,twitch response noted    PATIENT EDUCATION: Education details: HEP, PT plan of care, self care education provided for phantom sensations, see above for details.  Person educated: Patient Education method: Programmer, multimedia, Demonstration, Verbal cues, and Handouts Education comprehension: verbalized understanding and needs further education   HOME EXERCISE PROGRAM:   ASSESSMENT:  CLINICAL IMPRESSION: He has now met his PT goals and feels ready to discharge to independent program.  He shows good understanding of self care soft tissue  techniques and nerve desensitization techniques to manage his pain and phantom sensations.   OBJECTIVE IMPAIRMENTS: decreased activity tolerance, difficulty walking, decreased balance, decreased endurance, decreased mobility, decreased strength, LE use, postural dysfunction, and pain.  ACTIVITY LIMITATIONS: bending, lifting, carry, locomotion, cleaning, community activity, driving  PERSONAL FACTORS: Rt THA (anterior approach) 07/31/21. Left TTTA, left THA (05/23/20), arthritis, DM2, HTN, HLD, PVD are also affecting patient's functional outcome.  REHAB POTENTIAL: Good  CLINICAL DECISION MAKING: Stable/uncomplicated  EVALUATION COMPLEXITY: Low    GOALS: Short term PT Goals Target date: 04/27/2023   Pt will be I and compliant with self care recommendations and name 3 ways to manage phantom sensation/pain.  Baseline:  Goal status: MET 05/09/23 Pt will decrease pain by 25% overall Baseline: Goal status: MET 05/09/23  Long term PT goals Target date:07/11/2023  Pt will reduce pain and phantom sensations by overall 50% Baseline: Goal status: MET 07/04/23, he reports 65% improvement Pt will be able to ambulate community distances at least 400 ft [facility] gait pattern with less complaints of residual limb pain. Baseline: Goal status: MET 07/04/23  PLAN: PT FREQUENCY: 1-2 times per week   PT DURATION: 4-6 weeks  PLANNED INTERVENTIONS (unless contraindicated): aquatic PT, prosthetic training, Canalith repositioning, cryotherapy, Electrical stimulation, Iontophoresis with 4 mg/ml dexamethasome, Moist heat, traction, Ultrasound, gait training, Therapeutic exercise, balance training, neuromuscular re-education, patient/family education, prosthetic training, manual techniques, passive ROM, dry needling, taping, vasopnuematic device, vestibular, spinal manipulations, joint manipulations  PLAN FOR NEXT SESSION: DC today    April Manson, PT,DPT 07/04/2023, 9:39 AM

## 2023-08-16 ENCOUNTER — Ambulatory Visit: Payer: BC Managed Care – PPO | Admitting: Vascular Surgery

## 2023-08-16 ENCOUNTER — Other Ambulatory Visit (HOSPITAL_COMMUNITY): Payer: BC Managed Care – PPO

## 2023-10-03 NOTE — Progress Notes (Unsigned)
Patient name: Anthony Park MRN: [id number] DOB: [birth year] Sex: male  REASON FOR VISIT: 6 month follow-up for surveillance of AAA  HPI: Anthony Park is a 63 y.o. male with history of diabetes, hypertension, hyperlipidemia, peripheral vascular disease who presents for 37-month follow-up for evaluation of AAA.  Previously seen with concern for a 5.4 cm AAA on ultrasound on 01/06/2023.  He was sent for CTA that showed a 4.3 cm AAA.  Today no new abdominal or back pain.  He does have a wound on his right second toe.  States this has been ongoing for a long time.  Previously had his right third toe amputated.  He has a complex history and previously had a left popliteal stent at [facility] for popliteal aneurysm. We saw him with tissue loss and ultimately he had a left peroneal angioplasty on 08/06/2020 with tibial and small vessel disease in the setting of CLI. Ultimately required a TMA on 08/20/2020 and then TMA debridement and ultimately this became nonhealing and necrotic. He was eventually converted to a BKA.  He is walking with a prosthesis with his left leg now.  Past Medical History:  Diagnosis Date   Arthritis    Diabetes mellitus without complication (HCC)    type 2 controlled with diet    Diabetic toe ulcer (HCC) 11/05/2018   History of kidney stones    passed 1 several years ago   Hyperlipidemia    Hypertension    not on medications   Peripheral vascular disease (HCC)    stent in left leg due to anerysm     Past Surgical History:  Procedure Laterality Date    Left Transmetatarsal Ampuation (Left Foot)  08/20/2020   ABDOMINAL AORTOGRAM W/LOWER EXTREMITY N/A 08/06/2020   Procedure: ABDOMINAL AORTOGRAM W/LOWER EXTREMITY;  Surgeon: [name], MD;  Location: [facility];  Service: Cardiovascular;  Laterality: N/A;   AMPUTATION Right 11/07/2018   Procedure: RIGHT 3RD TOE AMPUTATION;  Surgeon: Nadara Mustard, MD;  Location: [facility] OR;  Service: Orthopedics;   Laterality: Right;   AMPUTATION Left 08/20/2020   Procedure: Left Transmetatarsal Ampuation;  Surgeon: [name], MD;  Location: [facility] OR;  Service: Vascular;  Laterality: Left;   AMPUTATION Left 10/03/2020   Procedure: LEFT BELOW KNEE AMPUTATION;  Surgeon: [name], MD;  Location: [facility] OR;  Service: Vascular;  Laterality: Left;   AMPUTATION TOE Left    big toe and one next to it   CATARACT EXTRACTION Bilateral    EYE SURGERY Bilateral    cataracts removed   INSERTION OF ILIAC STENT Left    PERIPHERAL VASCULAR BALLOON ANGIOPLASTY Left 08/06/2020   Procedure: PERIPHERAL VASCULAR BALLOON ANGIOPLASTY;  Surgeon: [name], MD;  Location: [facility];  Service: Cardiovascular;  Laterality: Left;  Peroneal artery.   TOE AMPUTATION Left 2015   TOTAL HIP ARTHROPLASTY Left 05/23/2020   Procedure: LEFT TOTAL HIP ARTHROPLASTY ANTERIOR APPROACH;  Surgeon: Kathryne Hitch, MD;  Location: [facility];  Service: Orthopedics;  Laterality: Left;   TOTAL HIP ARTHROPLASTY Right 07/31/2021   Procedure: RIGHT TOTAL HIP ARTHROPLASTY ANTERIOR APPROACH;  Surgeon: Kathryne Hitch, MD;  Location: [facility];  Service: Orthopedics;  Laterality: Right;   WOUND DEBRIDEMENT Left 09/11/2020   Procedure: DEBRIDEMENT OF LEFT TRANSMETATARSAL WOUND;  Surgeon: Leonie Douglas, MD;  Location: [facility] OR;  Service: Vascular;  Laterality: Left;    Family History  Problem Relation Age of Onset   COPD Mother  Cataracts Mother    Brain cancer Father    Hypertension Brother     SOCIAL HISTORY: Social History   Tobacco Use   Smoking status: Former    Current packs/day: 0.00    Average packs/day: 0.5 packs/day for 41.0 years (20.5 ttl pk-yrs)    Types: Cigarettes    Start date: 07/21/1979    Quit date: 07/20/2020    Years since quitting: 3.2   Smokeless tobacco: Never  Substance Use Topics   Alcohol use: Yes    Alcohol/week: 14.0 standard drinks of alcohol    Types: 14 Standard  drinks or equivalent per week    Comment: occas.    Allergies  Allergen Reactions   Meloxicam Nausea Only    Upset stomach     Current Outpatient Medications  Medication Sig Dispense Refill   acetaminophen (TYLENOL) 500 MG tablet Take 1,000 mg by mouth every 6 (six) hours as needed for moderate pain.     ascorbic acid (VITAMIN C) 500 MG tablet Take 500 mg by mouth daily.     aspirin EC 81 MG tablet Take 1 tablet (81 mg total) by mouth 2 (two) times daily after a meal. Swallow whole. 30 tablet 0   clopidogrel (PLAVIX) 75 MG tablet 1 tablet Orally Once a day for 30 days     EPIPEN 2-PAK 0.3 MG/0.3ML SOAJ injection as directed Injection as directed for anaphylaxis for 1 days     FARXIGA 5 MG TABS tablet 1 tablet Orally Once a day     folic acid (FOLVITE) 1 MG tablet Take 1 tablet (1 mg total) by mouth daily. 30 tablet 1   hydrochlorothiazide (HYDRODIURIL) 25 MG tablet 1 tablet in the morning Orally Once a day     lisinopril (ZESTRIL) 40 MG tablet Take 1 tablet (40 mg total) by mouth daily. 30 tablet 0   metFORMIN (GLUCOPHAGE) 500 MG tablet TAKE 1 TABLET BY MOUTH 2 TIMES DAILY WITH A MEAL. (Patient not taking: Reported on 02/15/2023) 60 tablet 0   methocarbamol (ROBAXIN) 500 MG tablet Take 1 tablet (500 mg total) by mouth every 6 (six) hours as needed for muscle spasms. 30 tablet 0   Multiple Vitamin (MULTIVITAMIN WITH MINERALS) TABS tablet Take 1 tablet by mouth daily.     mupirocin ointment (BACTROBAN) 2 % 1 application Externally Twice a day     olmesartan (BENICAR) 40 MG tablet 1 tablet Orally Once a day     POLY-IRON 150 FORTE 150-25-1 MG-MCG-MG CAPS Take 150 mg by mouth daily.     pregabalin (LYRICA) 50 MG capsule Take 1 capsule (50 mg total) by mouth 3 (three) times daily. 90 capsule 1   rosuvastatin (CRESTOR) 10 MG tablet Take 10 mg by mouth at bedtime.     tiZANidine (ZANAFLEX) 2 MG tablet Take 2 mg by mouth 3 (three) times daily as needed.     traMADol (ULTRAM) 50 MG tablet Take 1  tablet (50 mg total) by mouth every 6 (six) hours as needed. 30 tablet 0   No current facility-administered medications for this visit.    REVIEW OF SYSTEMS:  [X]  denotes positive finding, [ ]  denotes negative finding Cardiac  Comments:  Chest pain or chest pressure:    Shortness of breath upon exertion:    Short of breath when lying flat:    Irregular heart rhythm:        Vascular    Pain in calf, thigh, or hip brought on by ambulation:  Pain in feet at night that wakes you up from your sleep:     Blood clot in your veins:    Leg swelling:         Pulmonary    Oxygen at home:    Productive cough:     Wheezing:         Neurologic    Sudden weakness in arms or legs:     Sudden numbness in arms or legs:     Sudden onset of difficulty speaking or slurred speech:    Temporary loss of vision in one eye:     Problems with dizziness:         Gastrointestinal    Blood in stool:     Vomited blood:         Genitourinary    Burning when urinating:     Blood in urine:        Psychiatric    Major depression:         Hematologic    Bleeding problems:    Problems with blood clotting too easily:        Skin    Rashes or ulcers:        Constitutional    Fever or chills:      PHYSICAL EXAM: There were no vitals filed for this visit.   GENERAL: The patient is a well-nourished male, in no acute distress. The vital signs are documented above. VASCULAR:  Bilateral femoral pulses palpable Right pedal pulses nonpalpable Left BKA healed with prosthesis Right second toe ulcer as pictured with drainage ABDOMEN: No Pain.  No rebound or guarding.    DATA:   AAA duplex shows no change in his aneurysm stable at 4.3 cm.  Assessment/Plan:  63 y.o. male with history of diabetes, hypertension, hyperlipidemia, peripheral vascular disease who presents for 53-month follow-up for evaluation of AAA.  Previously seen with concern for a 5.4 cm AAA on ultrasound on 01/06/2023.  He was  sent for CTA that showed a 4.3 cm AAA.  Discussed his duplex today shows his AAA is stable at 4.3 cm.  No growth over the last 6 months.  Does not require surgical intervention.  Discussed in men we repair these greater than 5.5 cm.  I will repeat AAA duplex in 6 months.  I am concerned as he has a ulcer with drainage on his right second toe as pictured.  He does not have any palpable right pedal pulses on my assessment.  I will bring him back for right leg arterial duplex and ABIs and he may require right leg intervention to optimize inflow for wound healing as I discussed with him today.  [name], MD [facility] Office: [phone]

## 2023-10-04 ENCOUNTER — Ambulatory Visit (HOSPITAL_COMMUNITY)
Admission: RE | Admit: 2023-10-04 | Discharge: 2023-10-04 | Disposition: A | Discharge status: Home / Self Care | Payer: Medicare HMO | Source: Ambulatory Visit | Attending: Vascular Surgery | Admitting: Vascular Surgery

## 2023-10-04 ENCOUNTER — Encounter: Payer: Self-pay | Admitting: Vascular Surgery

## 2023-10-04 ENCOUNTER — Ambulatory Visit (INDEPENDENT_AMBULATORY_CARE_PROVIDER_SITE_OTHER): Payer: Medicare HMO | Admitting: Vascular Surgery

## 2023-10-04 VITALS — BP 155/89 | HR 62 | Temp 98.4°F | Resp 18 | Ht 72.0 in | Wt 239.2 lb

## 2023-10-04 DIAGNOSIS — I7143 Infrarenal abdominal aortic aneurysm, without rupture: Secondary | ICD-10-CM

## 2023-10-04 DIAGNOSIS — I739 Peripheral vascular disease, unspecified: Secondary | ICD-10-CM | POA: Diagnosis not present

## 2023-10-13 ENCOUNTER — Telehealth: Payer: Self-pay

## 2023-10-13 DIAGNOSIS — L97512 Non-pressure chronic ulcer of other part of right foot with fat layer exposed: Secondary | ICD-10-CM | POA: Diagnosis not present

## 2023-10-13 DIAGNOSIS — L03115 Cellulitis of right lower limb: Secondary | ICD-10-CM | POA: Diagnosis not present

## 2023-10-13 DIAGNOSIS — I739 Peripheral vascular disease, unspecified: Secondary | ICD-10-CM | POA: Diagnosis not present

## 2023-10-13 DIAGNOSIS — E1151 Type 2 diabetes mellitus with diabetic peripheral angiopathy without gangrene: Secondary | ICD-10-CM | POA: Diagnosis not present

## 2023-10-13 DIAGNOSIS — E11621 Type 2 diabetes mellitus with foot ulcer: Secondary | ICD-10-CM | POA: Diagnosis not present

## 2023-10-13 NOTE — Telephone Encounter (Signed)
Triage Call: [facility] RN called r/t appt pt had with NP who is requesting pt to be seen ASAP for this RLE and toe wound, that he is now taking an antibiotic and he has a WBC.  On chart review pt seen on 1/21 by Dr. Chestine Spore who noted concern for the R 2nd toe with no pulse on assessment requesting RLE duplex & ABI.  Appt booked for 03/11 and appt changed to triage appt 1/31/ 25

## 2023-10-14 ENCOUNTER — Other Ambulatory Visit (HOSPITAL_COMMUNITY): Payer: Self-pay | Admitting: Vascular Surgery

## 2023-10-14 ENCOUNTER — Ambulatory Visit (INDEPENDENT_AMBULATORY_CARE_PROVIDER_SITE_OTHER): Payer: Medicare HMO | Admitting: Physician Assistant

## 2023-10-14 ENCOUNTER — Ambulatory Visit (HOSPITAL_COMMUNITY)
Admission: RE | Admit: 2023-10-14 | Discharge: 2023-10-14 | Disposition: A | Discharge status: Home / Self Care | Payer: Medicare HMO | Source: Ambulatory Visit | Attending: Vascular Surgery | Admitting: Vascular Surgery

## 2023-10-14 ENCOUNTER — Ambulatory Visit (HOSPITAL_COMMUNITY)
Admission: RE | Admit: 2023-10-14 | Discharge: 2023-10-14 | Disposition: A | Discharge status: Home / Self Care | Payer: Medicare HMO | Source: Ambulatory Visit | Attending: Vascular Surgery

## 2023-10-14 VITALS — BP 102/68 | HR 66 | Temp 98.3°F | Resp 18 | Ht 72.0 in | Wt 237.1 lb

## 2023-10-14 DIAGNOSIS — I739 Peripheral vascular disease, unspecified: Secondary | ICD-10-CM

## 2023-10-14 DIAGNOSIS — S91109D Unspecified open wound of unspecified toe(s) without damage to nail, subsequent encounter: Secondary | ICD-10-CM | POA: Diagnosis not present

## 2023-10-14 DIAGNOSIS — S91301A Unspecified open wound, right foot, initial encounter: Secondary | ICD-10-CM

## 2023-10-14 LAB — VAS US ABI WITH/WO TBI: Right ABI: 1

## 2023-10-14 NOTE — Progress Notes (Signed)
CC:  F/u for surgery  HPI:  This is a 63 y.o. male who is here with with a chief complaint of incresed drainage and erythema of the right second toe.  He was last seen by Dr. Chestine Spore on 10/04/23.   The toe at the time was no swollen and inflamed.  He is followed by Dr. Lajoyce Corners as well.  He had the thrid toe amputated by Dr. Lajoyce Corners 11/07/18.  He also had left LE tissue loss, TMA followed by [facility] by Dr. Chestine Spore in the past.  He has tried to be diligent and take better care of his foot since then.  He even drinks special teas and watches his glucose closer.    He was seen by his primary care and placed on oral antibiotics-Doxycycline and has been doing warm water soaks with episome salts.  He even trimmed out a hole in the top of his shoe to take any pressure off his toe.       He is medically managed on ASA, Plavix and Lipitor.    Allergies  Allergen Reactions   Meloxicam Nausea Only    Upset stomach     Current Outpatient Medications  Medication Sig Dispense Refill   acetaminophen (TYLENOL) 500 MG tablet Take 1,000 mg by mouth every 6 (six) hours as needed for moderate pain.     ascorbic acid (VITAMIN C) 500 MG tablet Take 500 mg by mouth daily.     aspirin EC 81 MG tablet Take 1 tablet (81 mg total) by mouth 2 (two) times daily after a meal. Swallow whole. 30 tablet 0   clopidogrel (PLAVIX) 75 MG tablet 1 tablet Orally Once a day for 30 days     doxycycline (VIBRA-TABS) 100 MG tablet 1 tablet Orally twice daily for 10 days     EPIPEN 2-PAK 0.3 MG/0.3ML SOAJ injection as directed Injection as directed for anaphylaxis for 1 days     folic acid (FOLVITE) 1 MG tablet Take 1 tablet (1 mg total) by mouth daily. 30 tablet 1   hydrochlorothiazide (HYDRODIURIL) 25 MG tablet 1 tablet in the morning Orally Once a day     JARDIANCE 10 MG TABS tablet 1 tablet in the morning Orally Once a day     lisinopril (ZESTRIL) 40 MG tablet Take 1 tablet (40 mg total) by mouth daily. 30 tablet 0   metFORMIN  (GLUCOPHAGE) 500 MG tablet TAKE 1 TABLET BY MOUTH 2 TIMES DAILY WITH A MEAL. 60 tablet 0   methocarbamol (ROBAXIN) 500 MG tablet Take 1 tablet (500 mg total) by mouth every 6 (six) hours as needed for muscle spasms. 30 tablet 0   Multiple Vitamin (MULTIVITAMIN WITH MINERALS) TABS tablet Take 1 tablet by mouth daily.     mupirocin ointment (BACTROBAN) 2 % 1 application Externally Twice a day     olmesartan (BENICAR) 40 MG tablet 1 tablet Orally Once a day     pregabalin (LYRICA) 50 MG capsule Take 1 capsule (50 mg total) by mouth 3 (three) times daily. 90 capsule 1   rosuvastatin (CRESTOR) 10 MG tablet Take 10 mg by mouth at bedtime.     tiZANidine (ZANAFLEX) 2 MG tablet Take 2 mg by mouth 3 (three) times daily as needed.     traMADol (ULTRAM) 50 MG tablet Take 1 tablet (50 mg total) by mouth every 6 (six) hours as needed. 30 tablet 0   FARXIGA 5 MG TABS tablet 1 tablet Orally Once a day (  Patient not taking: Reported on 10/14/2023)     POLY-IRON 150 FORTE 150-25-1 MG-MCG-MG CAPS Take 150 mg by mouth daily.     No current facility-administered medications for this visit.     ROS:  See HPI  Physical Exam:    ABI Findings:  +---------+------------------+-----+----------+--------+  Right   Rt Pressure (mmHg)IndexWaveform  Comment   +---------+------------------+-----+----------+--------+  Brachial 119                                        +---------+------------------+-----+----------+--------+  PTA     119               1.00 monophasic          +---------+------------------+-----+----------+--------+  DP      77                0.65 monophasic          +---------+------------------+-----+----------+--------+  Great Toe20                0.17                     +---------+------------------+-----+----------+--------+   +---------+------------------+-----+--------+-------+  Left    Lt Pressure (mmHg)IndexWaveformComment   +---------+------------------+-----+--------+-------+  Brachial 111                                     +---------+------------------+-----+--------+-------+  PTA                                    BKA      +---------+------------------+-----+--------+-------+  DP                                     BKA      +---------+------------------+-----+--------+-------+  Great Toe                               BKA      +---------+------------------+-----+--------+-------+   +-------+-----------+-----------+------------+------------+  ABI/TBIToday's ABIToday's TBIPrevious ABIPrevious TBI  +-------+-----------+-----------+------------+------------+  Right 1.00       0.17                                 +-------+-----------+-----------+------------+------------+  Left  BKA        BKA                                  +-------+-----------+-----------+------------+------------+   -----------+--------+-----+---------------+---------+--------+  RIGHT     PSV cm/sRatioStenosis       Waveform Comments  +-----------+--------+-----+---------------+---------+--------+  CFA Mid    90                          triphasic          +-----------+--------+-----+---------------+---------+--------+  DFA       40                          triphasic          +-----------+--------+-----+---------------+---------+--------+  SFA Prox   203          50-74% stenosistriphasic          +-----------+--------+-----+---------------+---------+--------+  SFA Mid    126                                            +-----------+--------+-----+---------------+---------+--------+  SFA Distal 86                          triphasic          +-----------+--------+-----+---------------+---------+--------+  POP Prox   48                          triphasic          +-----------+--------+-----+---------------+---------+--------+  POP Distal 100                          triphasic          +-----------+--------+-----+---------------+---------+--------+  ATA Distal 66                          triphasic          +-----------+--------+-----+---------------+---------+--------+  PTA Distal 64                          biphasic           +-----------+--------+-----+---------------+---------+--------+  PERO Distal97                          biphasic           +-----------+--------+-----+---------------+---------+--------+     Summary:  Right: 50-74% stenosis noted in the superficial femoral artery.   Palpable femoral pulses B, no right pedal pulses Lungs non labored breathing Ambulates with a left prothesis Heart RRR Abdomin NTTP, non distended   Assessment/Plan:  This is a 63 y.o. male who has signs of infection right second toe.  He will f/u with DR. Duda on Wednesday.  I think he will need the toe amputated.  The duplex demonstrated, stenosis in the SFA.  The ABI's demonstrate monophasic wave forms with significant decreased toe pressure of 20.  I will schedule him for angiogram of the right LE with second order cath and possible angioplasty and or stent to improve his chance of healing the toe or amputation site healing.    He will cont currently care to the right toe.  He would like to do everything to save the right leg.     Mosetta Pigeon PA-C [facility] [phone]   Call MD:  Randie Heinz

## 2023-10-18 ENCOUNTER — Telehealth: Payer: Self-pay | Admitting: *Deleted

## 2023-10-18 NOTE — Telephone Encounter (Signed)
pts has appointment to see Dr. Lajoyce Corners 10/19/23 . wanted to talk with him and will call back to schudule surgery if needed

## 2023-10-19 ENCOUNTER — Other Ambulatory Visit: Payer: Self-pay

## 2023-10-19 ENCOUNTER — Ambulatory Visit (INDEPENDENT_AMBULATORY_CARE_PROVIDER_SITE_OTHER): Payer: Medicare HMO | Admitting: Family

## 2023-10-19 ENCOUNTER — Encounter: Payer: Self-pay | Admitting: Family

## 2023-10-19 DIAGNOSIS — L97519 Non-pressure chronic ulcer of other part of right foot with unspecified severity: Secondary | ICD-10-CM

## 2023-10-19 DIAGNOSIS — I739 Peripheral vascular disease, unspecified: Secondary | ICD-10-CM

## 2023-10-19 NOTE — Progress Notes (Signed)
 Office Visit Note   Patient: Anthony Park           Date of Birth: [birth year]           MRN: [id number] Visit Date: 10/19/2023              Requested by: Stephane Leita DEL, MD [address] [place],  [place] [address] PCP: Stephane Leita DEL, MD  Chief Complaint  Patient presents with   Left Leg - Follow-up    HX left BKA       HPI: The patient is a 63 year old gentleman who is being followed for an ulcer to the dorsum of the right second toe presents today in follow-up  He has recently been seen by vascular who recommended angiogram.  He overall is in pleased with the improvement of the color and swelling of the toe  Assessment & Plan: Visit Diagnoses:  1. Ulcer of toe of right foot, unspecified ulcer stage (HCC)     Plan: With exposed bone and the ulcer to the dorsum of the toe discussed osteomyelitis and amputation of the toe.  At this point recommended he continue with his doxycycline  and current wound care he will follow-up with vascular and have the angiogram.  Will plan for amputation of the second toe after vascular workup and intervention  Follow-Up Instructions: No follow-ups on file.   Ortho Exam  Patient is alert, oriented, no adenopathy, well-dressed, normal affect, normal respiratory effort. On examination right foot the second toe has dorsal ulcer which is 6 mm in diameter with bone exposed in the wound bed there is mild surrounding erythema there is no else ascending cellulitis or sausage digit swelling  Imaging: No results found. No images are attached to the encounter.  Labs: Lab Results  Component Value Date   HGBA1C 6.5 (H) 07/24/2021   HGBA1C 6.2 (H) 10/03/2020   HGBA1C 6.8 (H) 05/20/2020   CRP 3.8 (H) 10/03/2020   CRP 53.7 (H) 09/23/2020   LABURIC 6.9 07/29/2020   REPTSTATUS 09/16/2020 FINAL 09/11/2020   GRAMSTAIN  09/11/2020    RARE WBC PRESENT, PREDOMINANTLY MONONUCLEAR MODERATE GRAM NEGATIVE RODS FEW GRAM VARIABLE ROD    CULT  09/11/2020     MODERATE PSEUDOMONAS AERUGINOSA RARE ESCHERICHIA COLI NO ANAEROBES ISOLATED Performed at [facility], [address], [place], [place] [address]    LABORGA PSEUDOMONAS AERUGINOSA 09/11/2020   LABORGA ESCHERICHIA COLI 09/11/2020     Lab Results  Component Value Date   ALBUMIN  3.4 (L) 10/03/2020   ALBUMIN  3.6 09/11/2020   ALBUMIN  3.5 11/06/2018    Lab Results  Component Value Date   MG 1.9 08/24/2020   MG 1.6 (L) 08/23/2020   No results found for: VD25OH  No results found for: PREALBUMIN    Latest Ref Rng & Units 07/24/2021    8:38 AM 10/06/2020    3:42 AM 10/05/2020    6:14 AM  CBC EXTENDED  WBC 4.0 - 10.5 K/uL 8.4  7.4  8.0   RBC 4.22 - 5.81 MIL/uL 4.89  2.83  2.80   Hemoglobin 13.0 - 17.0 g/dL 86.7  8.1  8.1   HCT 60.9 - 52.0 % 40.4  26.1  25.4   Platelets 150 - 400 K/uL 240  254  262      There is no height or weight on file to calculate BMI.  Orders:  Orders Placed This Encounter  Procedures   XR Toe 2nd Right   No orders of  the defined types were placed in this encounter.    Procedures: No procedures performed  Clinical Data: No additional findings.  ROS:  All other systems negative, except as noted in the HPI. Review of Systems  Objective: Vital Signs: There were no vitals taken for this visit.  Specialty Comments:  No specialty comments available.  PMFS History: Patient Active Problem List   Diagnosis Date Noted   Hypogonadism, male 01/19/2023   Erectile dysfunction 01/19/2023   Aneurysm of abdominal vessel (HCC) 01/19/2023   Chronic kidney disease, stage 3a (HCC) 01/19/2023   AAA (abdominal aortic aneurysm) without rupture (HCC) 01/18/2023   S/P BKA (below knee amputation) unilateral, left (HCC) 10/03/2020   Non-healing wound of lower extremity 10/03/2020   History of amputation of left foot through metatarsal bone (HCC) 09/11/2020   PAD (peripheral artery disease) (HCC) 08/20/2020   Status post total replacement of left  hip 06/05/2020   Unilateral primary osteoarthritis, right hip 04/17/2020   Unilateral primary osteoarthritis, left hip 04/17/2020   Amputated toe, right (HCC) 11/22/2018   Type 2 diabetes mellitus with foot ulcer, without long-term current use of insulin  (HCC) 11/22/2018   Mixed hyperlipidemia 11/22/2018   Aneurysm of left popliteal artery (HCC) 11/22/2018   Status post peripheral artery angioplasty with insertion of stent 11/22/2018   Osteomyelitis of third toe of right foot (HCC)    Cellulitis of third toe of right foot    Past Medical History:  Diagnosis Date   AAA (abdominal aortic aneurysm) (HCC)    Arthritis    Diabetes mellitus without complication (HCC)    type 2 controlled with diet    Diabetic toe ulcer (HCC) 11/05/2018   History of kidney stones    passed 1 several years ago   Hyperlipidemia    Hypertension    not on medications   Peripheral arterial disease (HCC)    Peripheral vascular disease (HCC)    stent in left leg due to anerysm     Family History  Problem Relation Age of Onset   COPD Mother    Cataracts Mother    Brain cancer Father    Hypertension Brother     Past Surgical History:  Procedure Laterality Date    Left Transmetatarsal Ampuation (Left Foot)  08/20/2020   ABDOMINAL AORTOGRAM W/LOWER EXTREMITY N/A 08/06/2020   Procedure: ABDOMINAL AORTOGRAM W/LOWER EXTREMITY;  Surgeon: Gretta Lonni PARAS, MD;  Location: [facility];  Service: Cardiovascular;  Laterality: N/A;   AMPUTATION Right 11/07/2018   Procedure: RIGHT 3RD TOE AMPUTATION;  Surgeon: Harden Jerona GAILS, MD;  Location: [facility] OR;  Service: Orthopedics;  Laterality: Right;   AMPUTATION Left 08/20/2020   Procedure: Left Transmetatarsal Ampuation;  Surgeon: Gretta Lonni PARAS, MD;  Location: [facility] OR;  Service: Vascular;  Laterality: Left;   AMPUTATION Left 10/03/2020   Procedure: LEFT BELOW KNEE AMPUTATION;  Surgeon: Gretta Lonni PARAS, MD;  Location: [facility] OR;  Service: Vascular;  Laterality:  Left;   AMPUTATION TOE Left    big toe and one next to it   CATARACT EXTRACTION Bilateral    EYE SURGERY Bilateral    cataracts removed   INSERTION OF ILIAC STENT Left    PERIPHERAL VASCULAR BALLOON ANGIOPLASTY Left 08/06/2020   Procedure: PERIPHERAL VASCULAR BALLOON ANGIOPLASTY;  Surgeon: Gretta Lonni PARAS, MD;  Location: [facility];  Service: Cardiovascular;  Laterality: Left;  Peroneal artery.   TOE AMPUTATION Left 2015   TOTAL HIP ARTHROPLASTY Left 05/23/2020   Procedure: LEFT TOTAL  HIP ARTHROPLASTY ANTERIOR APPROACH;  Surgeon: Vernetta Lonni GRADE, MD;  Location: [facility];  Service: Orthopedics;  Laterality: Left;   TOTAL HIP ARTHROPLASTY Right 07/31/2021   Procedure: RIGHT TOTAL HIP ARTHROPLASTY ANTERIOR APPROACH;  Surgeon: Vernetta Lonni GRADE, MD;  Location: [facility];  Service: Orthopedics;  Laterality: Right;   WOUND DEBRIDEMENT Left 09/11/2020   Procedure: DEBRIDEMENT OF LEFT TRANSMETATARSAL WOUND;  Surgeon: Magda Debby SAILOR, MD;  Location: [facility] OR;  Service: Vascular;  Laterality: Left;   Social History   Occupational History   Not on file  Tobacco Use   Smoking status: Former    Current packs/day: 0.00    Average packs/day: 0.5 packs/day for 41.0 years (20.5 ttl pk-yrs)    Types: Cigarettes    Start date: 07/21/1979    Quit date: 07/20/2020    Years since quitting: 3.2   Smokeless tobacco: Never  Vaping Use   Vaping status: Never Used  Substance and Sexual Activity   Alcohol  use: Yes    Alcohol /week: 14.0 standard drinks of alcohol     Types: 14 Standard drinks or equivalent per week    Comment: occas.   Drug use: Not Currently    Comment: hx of marijuana 15 years ago    Sexual activity: Yes

## 2023-10-27 ENCOUNTER — Encounter (HOSPITAL_COMMUNITY)
Admission: RE | Disposition: A | Discharge status: Home / Self Care | Payer: Self-pay | Source: Ambulatory Visit | Attending: Vascular Surgery

## 2023-10-27 ENCOUNTER — Ambulatory Visit (HOSPITAL_COMMUNITY)
Admission: RE | Admit: 2023-10-27 | Discharge: 2023-10-27 | Disposition: A | Discharge status: Home / Self Care | Payer: Medicare HMO | Source: Ambulatory Visit | Attending: Vascular Surgery | Admitting: Vascular Surgery

## 2023-10-27 ENCOUNTER — Other Ambulatory Visit: Payer: Self-pay

## 2023-10-27 DIAGNOSIS — Z89612 Acquired absence of left leg above knee: Secondary | ICD-10-CM | POA: Diagnosis not present

## 2023-10-27 DIAGNOSIS — I70235 Atherosclerosis of native arteries of right leg with ulceration of other part of foot: Secondary | ICD-10-CM | POA: Insufficient documentation

## 2023-10-27 DIAGNOSIS — Z7902 Long term (current) use of antithrombotics/antiplatelets: Secondary | ICD-10-CM | POA: Insufficient documentation

## 2023-10-27 DIAGNOSIS — L97519 Non-pressure chronic ulcer of other part of right foot with unspecified severity: Secondary | ICD-10-CM | POA: Insufficient documentation

## 2023-10-27 DIAGNOSIS — I739 Peripheral vascular disease, unspecified: Secondary | ICD-10-CM

## 2023-10-27 DIAGNOSIS — Z79899 Other long term (current) drug therapy: Secondary | ICD-10-CM | POA: Diagnosis not present

## 2023-10-27 DIAGNOSIS — Z7982 Long term (current) use of aspirin: Secondary | ICD-10-CM | POA: Insufficient documentation

## 2023-10-27 HISTORY — PX: ABDOMINAL AORTOGRAM W/LOWER EXTREMITY: CATH118223

## 2023-10-27 HISTORY — PX: PERIPHERAL VASCULAR BALLOON ANGIOPLASTY: CATH118281

## 2023-10-27 LAB — POCT I-STAT, CHEM 8
BUN: 18 mg/dL (ref 8–23)
Calcium, Ion: 1.18 mmol/L (ref 1.15–1.40)
Chloride: 99 mmol/L (ref 98–111)
Creatinine, Ser: 1.2 mg/dL (ref 0.61–1.24)
Glucose, Bld: 143 mg/dL — ABNORMAL HIGH (ref 70–99)
HCT: 40 % (ref 39.0–52.0)
Hemoglobin: 13.6 g/dL (ref 13.0–17.0)
Potassium: 3.9 mmol/L (ref 3.5–5.1)
Sodium: 138 mmol/L (ref 135–145)
TCO2: 28 mmol/L (ref 22–32)

## 2023-10-27 SURGERY — ABDOMINAL AORTOGRAM W/LOWER EXTREMITY
Anesthesia: LOCAL

## 2023-10-27 MED ORDER — SODIUM CHLORIDE 0.9% FLUSH: 3.0000 mL | Freq: Two times a day (BID) | INTRAVENOUS | Status: DC

## 2023-10-27 MED ORDER — HYDRALAZINE HCL 20 MG/ML IJ SOLN: 5.0000 mg | INTRAMUSCULAR | Status: DC | PRN

## 2023-10-27 MED ORDER — ONDANSETRON HCL 4 MG/2ML IJ SOLN: 4.0000 mg | Freq: Four times a day (QID) | INTRAMUSCULAR | Status: DC | PRN

## 2023-10-27 MED ORDER — CLOPIDOGREL BISULFATE 75 MG PO TABS
ORAL_TABLET | ORAL | Status: AC
  Filled 2023-10-27: qty 1

## 2023-10-27 MED ORDER — HEPARIN SODIUM (PORCINE) 1000 UNIT/ML IJ SOLN
INTRAMUSCULAR | Status: DC | PRN
  Administered 2023-10-27: 2000 [IU] via INTRAVENOUS
  Administered 2023-10-27: 10000 [IU] via INTRAVENOUS

## 2023-10-27 MED ORDER — OXYCODONE HCL 5 MG PO TABS: 5.0000 mg | ORAL_TABLET | ORAL | Status: DC | PRN

## 2023-10-27 MED ORDER — CLOPIDOGREL BISULFATE 75 MG PO TABS: 75.0000 mg | ORAL_TABLET | Freq: Every day | ORAL | Status: DC

## 2023-10-27 MED ORDER — FENTANYL CITRATE (PF) 100 MCG/2ML IJ SOLN
INTRAMUSCULAR | Status: AC
  Filled 2023-10-27: qty 2

## 2023-10-27 MED ORDER — IODIXANOL 320 MG/ML IV SOLN
INTRAVENOUS | Status: DC | PRN
  Administered 2023-10-27: 40 mL via INTRA_ARTERIAL

## 2023-10-27 MED ORDER — MIDAZOLAM HCL 2 MG/2ML IJ SOLN
INTRAMUSCULAR | Status: AC
  Filled 2023-10-27: qty 2

## 2023-10-27 MED ORDER — ASPIRIN 81 MG PO CHEW
CHEWABLE_TABLET | ORAL | Status: AC
  Filled 2023-10-27: qty 1

## 2023-10-27 MED ORDER — LIDOCAINE HCL (PF) 1 % IJ SOLN
INTRAMUSCULAR | Status: DC | PRN
  Administered 2023-10-27: 15 mL via INTRADERMAL

## 2023-10-27 MED ORDER — SODIUM CHLORIDE 0.9 % IV SOLN: 250.0000 mL | INTRAVENOUS | Status: DC | PRN

## 2023-10-27 MED ORDER — SODIUM CHLORIDE 0.9% FLUSH: 3.0000 mL | INTRAVENOUS | Status: DC | PRN

## 2023-10-27 MED ORDER — ACETAMINOPHEN 325 MG PO TABS: 650.0000 mg | ORAL_TABLET | ORAL | Status: DC | PRN

## 2023-10-27 MED ORDER — ASPIRIN 81 MG PO CHEW
CHEWABLE_TABLET | ORAL | Status: DC | PRN
  Administered 2023-10-27: 81 mg via ORAL

## 2023-10-27 MED ORDER — HEPARIN (PORCINE) IN NACL 1000-0.9 UT/500ML-% IV SOLN
INTRAVENOUS | Status: DC | PRN
  Administered 2023-10-27: 1000 mL

## 2023-10-27 MED ORDER — HEPARIN SODIUM (PORCINE) 1000 UNIT/ML IJ SOLN
INTRAMUSCULAR | Status: AC
  Filled 2023-10-27: qty 10

## 2023-10-27 MED ORDER — MIDAZOLAM HCL 2 MG/2ML IJ SOLN
INTRAMUSCULAR | Status: DC | PRN
  Administered 2023-10-27: 1 mg via INTRAVENOUS

## 2023-10-27 MED ORDER — ASPIRIN 81 MG PO TBEC: 81.0000 mg | DELAYED_RELEASE_TABLET | Freq: Every day | ORAL | Status: DC

## 2023-10-27 MED ORDER — CLOPIDOGREL BISULFATE 300 MG PO TABS
ORAL_TABLET | ORAL | Status: DC | PRN
  Administered 2023-10-27: 75 mg via ORAL

## 2023-10-27 MED ORDER — LABETALOL HCL 5 MG/ML IV SOLN: 10.0000 mg | INTRAVENOUS | Status: DC | PRN

## 2023-10-27 MED ORDER — LIDOCAINE HCL (PF) 1 % IJ SOLN
INTRAMUSCULAR | Status: AC
  Filled 2023-10-27: qty 30

## 2023-10-27 MED ORDER — FENTANYL CITRATE (PF) 100 MCG/2ML IJ SOLN
INTRAMUSCULAR | Status: DC | PRN
  Administered 2023-10-27: 25 ug via INTRAVENOUS

## 2023-10-27 MED ORDER — SODIUM CHLORIDE 0.9 % IV SOLN: INTRAVENOUS | Status: DC

## 2023-10-27 SURGICAL SUPPLY — 24 items
BALLN COYOTE OTW 3X60X150 (BALLOONS) ×4 IMPLANT
BALLN STERLING OTW 3X60X150 (BALLOONS) ×4 IMPLANT
BALLOON COYOTE OTW 3X60X150 (BALLOONS) IMPLANT
BALLOON STERLING OTW 3X60X150 (BALLOONS) IMPLANT
CATH CROSS OVER TEMPO 5F (CATHETERS) IMPLANT
CATH NAVICROSS ST 65CM (CATHETERS) IMPLANT
CATH OMNI FLUSH 5F 65CM (CATHETERS) IMPLANT
CATH QUICKCROSS .035X135CM (MICROCATHETER) IMPLANT
CATHETER NAVICROSS ST 65CM (CATHETERS) ×2 IMPLANT
COVER DOME SNAP 22 D (MISCELLANEOUS) IMPLANT
DEVICE CLOSURE MYNXGRIP 5F (Vascular Products) IMPLANT
GLIDEWIRE ADV .035X260CM (WIRE) IMPLANT
GUIDEWIRE ANGLED .035X150CM (WIRE) IMPLANT
KIT ENCORE 26 ADVANTAGE (KITS) IMPLANT
KIT MICROPUNCTURE NIT STIFF (SHEATH) IMPLANT
SET ATX-X65L (MISCELLANEOUS) IMPLANT
SHEATH CATAPULT 5F 45 MP (SHEATH) IMPLANT
SHEATH PINNACLE 5F 10CM (SHEATH) IMPLANT
SHEATH PROBE COVER 6X72 (BAG) IMPLANT
TRAY PV CATH (CUSTOM PROCEDURE TRAY) ×2 IMPLANT
WIRE BENTSON .035X145CM (WIRE) IMPLANT
WIRE G V18X300CM (WIRE) IMPLANT
WIRE HI TORQ COMMND ES.014X300 (WIRE) IMPLANT
WIRE SPARTACORE .014X300CM (WIRE) IMPLANT

## 2023-10-27 NOTE — Progress Notes (Signed)
Patient walked to the bathroom without difficulties. Left groin level 0, clean, dry, and intact.

## 2023-10-27 NOTE — Discharge Instructions (Signed)
Femoral Site Care This sheet gives you information about how to care for yourself after your procedure. Your health care provider may also give you more specific instructions. If you have problems or questions, contact your health care provider. What can I expect after the procedure?  After the procedure, it is common to have: Bruising that usually fades within 1-2 weeks. Tenderness at the site. Follow these instructions at home: Wound care Follow instructions from your health care provider about how to take care of your insertion site. Make sure you: Wash your hands with soap and water before you change your bandage (dressing). If soap and water are not available, use hand sanitizer. Remove your dressing as told by your health care provider. In 24 hours Do not take baths, swim, or use a hot tub until your health care provider approves. You may shower 24-48 hours after the procedure or as told by your health care provider. Gently wash the site with plain soap and water. Pat the area dry with a clean towel. Do not rub the site. This may cause bleeding. Do not apply powder or lotion to the site. Keep the site clean and dry. Check your femoral site every day for signs of infection. Check for: Redness, swelling, or pain. Fluid or blood. Warmth. Pus or a bad smell. Activity For the first 2-3 days after your procedure, or as long as directed: Avoid climbing stairs as much as possible. Do not squat. Do not lift anything that is heavier than 10 lb (4.5 kg), or the limit that you are told, until your health care provider says that it is safe. For 5 days Rest as directed. Avoid sitting for a long time without moving. Get up to take short walks every 1-2 hours. Do not drive for 24 hours if you were given a medicine to help you relax (sedative). General instructions Take over-the-counter and prescription medicines only as told by your health care provider. Keep all follow-up visits as told by  your health care provider. This is important. Contact a health care provider if you have: A fever or chills. You have redness, swelling, or pain around your insertion site. Get help right away if: The catheter insertion area swells very fast. You pass out. You suddenly start to sweat or your skin gets clammy. The catheter insertion area is bleeding, and the bleeding does not stop when you hold steady pressure on the area. The area near or just beyond the catheter insertion site becomes pale, cool, tingly, or numb. These symptoms may represent a serious problem that is an emergency. Do not wait to see if the symptoms will go away. Get medical help right away. Call your local emergency services (911 in the [place] Do not drive yourself to the hospital. Summary After the procedure, it is common to have bruising that usually fades within 1-2 weeks. Check your femoral site every day for signs of infection. Do not lift anything that is heavier than 10 lb (4.5 kg), or the limit that you are told, until your health care provider says that it is safe. This information is not intended to replace advice given to you by your health care provider. Make sure you discuss any questions you have with your health care provider. Document Revised: 09/12/2017 Document Reviewed: 09/12/2017 Elsevier Patient Education  2020 [employer].

## 2023-10-27 NOTE — Op Note (Signed)
Patient name: Anthony Park MRN: [id number] DOB: 1960/10/23 Sex: male  10/27/2023 Pre-operative Diagnosis: Critical limb ischemia of right lower extremity with tissue loss Post-operative diagnosis:  Same Surgeon:  [name], MD Procedure Performed: 1.  Ultrasound-guided access left common femoral artery 2.  Aortogram with catheter selection of aorta 3.  Right lower extremity arteriogram with catheter selection of the posterior tibial artery 4.  Right posterior tibial artery angioplasty (3 mm x 60 mm Sterling and 3 mm x 60 mm coyote) 5.  Mynx closure of the left common femoral artery 6.  90 minutes of monitored moderate conscious sedation time  Indications: Patient is a 63 year old male well-known to our service having previously undergone left leg amputation.  Now with tissue loss in the right lower extremity with a toe wound.  Presents for right lower extremity arteriogram and possible intervention after risks benefits discussed.  Findings:   Abdominal aortogram showed known abdominal aortic aneurysm.  There were 2 right renal arteries and 1 left renal artery both patent.  His iliacs were very tortuous and calcified.  He has arteriomegaly.  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  The profundus patent.  The SFA and popliteal artery are diffusely diseased without flow-limiting stenosis.  He does appear to have a small right popliteal artery aneurysm.  He has severe tibial disease.  His anterior tibial is occluded.  The peroneal is patent proximally but then is atretic distally in the calf.  Posterior tibial is the dominant runoff with inline flow into the foot.  There was a 50-60% stenosis at the ostium of the vessel with a second high-grade over 80% stenosis just above the ankle.  Ultimately the right PT was treated with balloon angioplasty in both the proximal and distal vessel where the diseased segments were noted.  Due to significant tortuosity in his  vessels we had difficulty getting anything down the leg antegrade.  Ultimately we were able to treat posterior tibial in all diseased areas with no significant residual stenosis and preserved runoff in the PT.   Procedure:  The patient was identified in the holding area and taken to room 8.  The patient was then placed supine on the table and prepped and draped in the usual sterile fashion.  A time out was called.  The patient received Versed and fentanyl for conscious moderate sedation.  Vital signs were monitored including heart rate, respiratory rate, oxygenation blood pressure.  I was present for all of moderate sedation.  Ultrasound was used to evaluate the left common femoral artery.  It was patent .  A digital ultrasound image was acquired.  A micropuncture needle was used to access the left common femoral artery under ultrasound guidance.  An 018 wire was advanced without resistance and a micropuncture sheath was placed.  The 018 wire was removed and a benson wire was placed.  The micropuncture sheath was exchanged for a 5 french sheath.  An omniflush catheter was advanced over the wire to the level of L-1.  An abdominal angiogram was obtained.  Next, using the omniflush catheter and a benson wire, the aortic bifurcation was crossed and the catheter was placed into theright external iliac artery and right runoff was obtained.  After evaluating images we elected for intervention.  I used a Glidewire advantage to get down the right iliac into the right SFA and exchanged for a 5 [place] catapult sheath in the left groin over the aortic bifurcation.  Patient  was given 100 units/kg IV heparin.  We did give additional heparin during the case.  I got my Glidewire advantage all the way down to the popliteal artery on the right.  I then exchanged with a quick cross catheter for a V18 wire.  I had significant difficulty getting the wire down the posterior tibial due to disease at the ostium and the angle with the  wire wanting to got out a collateral and/or the peroneal.  After multiple angles and using a number of different wires with different angulations I finally was able get in the posterior tibial and got my wire all the way into the foot.  I treated the proximal posterior tibial with a 3 mm x 60 mm Sterling.  Due to calcification the balloon initially ruptured and had to exchanged for a second balloon and then we treated the proximal ostium for 2 minutes.  I then tried to go all the way down to the ankle to treat the second segment in the PT but the balloon was not long enough.  Ultimately I did exchange for an 014 wire into the foot and I was able to manipulate the sheath further down on the right in order to get more purchase length.  Finally I was able to get a coyote balloon all the way down to the ankle and this was treated with a 3 mm x 60 mm balloon to nominal pressure for 2 minutes.  No significant residual stenosis.  Wires and catheters were removed.  Short 5 French sheath was placed in the left groin with a mynx closure.  Plan: Optimized after today's revascularization.  Aspirin Plavix statin.  One month follow-up with right leg duplex and ABI - will evaluate size of common femoral and popliteal aneurysm on follow-up duplex.    [name], MD [facility] [place] Office: [phone]

## 2023-10-27 NOTE — H&P (Signed)
History and Physical Interval Note:  10/27/2023 10:49 AM  Anthony Park  has presented today for surgery, with the diagnosis of pad with open toe wound.  The various methods of treatment have been discussed with the patient and family. After consideration of risks, benefits and other options for treatment, the patient has consented to  Procedure(s): ABDOMINAL AORTOGRAM W/LOWER EXTREMITY (N/A) as a surgical intervention.  The patient's history has been reviewed, patient examined, no change in status, stable for surgery.  I have reviewed the patient's chart and labs.  Questions were answered to the patient's satisfaction.     Anthony Park           CC:  F/u for surgery   HPI:  This is a 63 y.o. male who is here with with a chief complaint of incresed drainage and erythema of the right second toe.  He was last seen by Dr. Chestine Spore on 10/04/23.   The toe at the time was no swollen and inflamed.  He is followed by Dr. Lajoyce Corners as well.  He had the thrid toe amputated by Dr. Lajoyce Corners 11/07/18.  He also had left LE tissue loss, TMA followed by [facility] by Dr. Chestine Spore in the past.  He has tried to be diligent and take better care of his foot since then.  He even drinks special teas and watches his glucose closer.               He was seen by his primary care and placed on oral antibiotics-Doxycycline and has been doing warm water soaks with episome salts.  He even trimmed out a hole in the top of his shoe to take any pressure off his toe.                     He is medically managed on ASA, Plavix and Lipitor.     Allergies       Allergies  Allergen Reactions   Meloxicam Nausea Only      Upset stomach               Current Outpatient Medications  Medication Sig Dispense Refill   acetaminophen (TYLENOL) 500 MG tablet Take 1,000 mg by mouth every 6 (six) hours as needed for moderate pain.       ascorbic acid (VITAMIN C) 500 MG tablet Take 500 mg by mouth daily.       aspirin EC 81 MG tablet Take 1  tablet (81 mg total) by mouth 2 (two) times daily after a meal. Swallow whole. 30 tablet 0   clopidogrel (PLAVIX) 75 MG tablet 1 tablet Orally Once a day for 30 days       doxycycline (VIBRA-TABS) 100 MG tablet 1 tablet Orally twice daily for 10 days       EPIPEN 2-PAK 0.3 MG/0.3ML SOAJ injection as directed Injection as directed for anaphylaxis for 1 days       folic acid (FOLVITE) 1 MG tablet Take 1 tablet (1 mg total) by mouth daily. 30 tablet 1   hydrochlorothiazide (HYDRODIURIL) 25 MG tablet 1 tablet in the morning Orally Once a day       JARDIANCE 10 MG TABS tablet 1 tablet in the morning Orally Once a day       lisinopril (ZESTRIL) 40 MG tablet Take 1 tablet (40 mg total) by mouth daily. 30 tablet 0   metFORMIN (GLUCOPHAGE) 500 MG tablet TAKE 1 TABLET BY MOUTH 2 TIMES DAILY WITH  A MEAL. 60 tablet 0   methocarbamol (ROBAXIN) 500 MG tablet Take 1 tablet (500 mg total) by mouth every 6 (six) hours as needed for muscle spasms. 30 tablet 0   Multiple Vitamin (MULTIVITAMIN WITH MINERALS) TABS tablet Take 1 tablet by mouth daily.       mupirocin ointment (BACTROBAN) 2 % 1 application Externally Twice a day       olmesartan (BENICAR) 40 MG tablet 1 tablet Orally Once a day       pregabalin (LYRICA) 50 MG capsule Take 1 capsule (50 mg total) by mouth 3 (three) times daily. 90 capsule 1   rosuvastatin (CRESTOR) 10 MG tablet Take 10 mg by mouth at bedtime.       tiZANidine (ZANAFLEX) 2 MG tablet Take 2 mg by mouth 3 (three) times daily as needed.       traMADol (ULTRAM) 50 MG tablet Take 1 tablet (50 mg total) by mouth every 6 (six) hours as needed. 30 tablet 0   FARXIGA 5 MG TABS tablet 1 tablet Orally Once a day (Patient not taking: Reported on 10/14/2023)       POLY-IRON 150 FORTE 150-25-1 MG-MCG-MG CAPS Take 150 mg by mouth daily.          No current facility-administered medications for this visit.         ROS:  See HPI   Physical Exam:      ABI Findings:   +---------+------------------+-----+----------+--------+  Right   Rt Pressure (mmHg)IndexWaveform  Comment   +---------+------------------+-----+----------+--------+  Brachial 119                                        +---------+------------------+-----+----------+--------+  PTA     119               1.00 monophasic          +---------+------------------+-----+----------+--------+  DP      77                0.65 monophasic          +---------+------------------+-----+----------+--------+  Great Toe20                0.17                     +---------+------------------+-----+----------+--------+   +---------+------------------+-----+--------+-------+  Left    Lt Pressure (mmHg)IndexWaveformComment  +---------+------------------+-----+--------+-------+  Brachial 111                                     +---------+------------------+-----+--------+-------+  PTA                                    BKA      +---------+------------------+-----+--------+-------+  DP                                     BKA      +---------+------------------+-----+--------+-------+  Great Toe                               BKA      +---------+------------------+-----+--------+-------+   +-------+-----------+-----------+------------+------------+  ABI/TBIToday's  ABIToday's TBIPrevious ABIPrevious TBI  +-------+-----------+-----------+------------+------------+  Right 1.00       0.17                                 +-------+-----------+-----------+------------+------------+  Left  BKA        BKA                                  +-------+-----------+-----------+------------+------------+    -----------+--------+-----+---------------+---------+--------+  RIGHT     PSV cm/sRatioStenosis       Waveform Comments  +-----------+--------+-----+---------------+---------+--------+  CFA Mid    90                           triphasic          +-----------+--------+-----+---------------+---------+--------+  DFA       40                          triphasic          +-----------+--------+-----+---------------+---------+--------+  SFA Prox   203          50-74% stenosistriphasic          +-----------+--------+-----+---------------+---------+--------+  SFA Mid    126                                            +-----------+--------+-----+---------------+---------+--------+  SFA Distal 86                          triphasic          +-----------+--------+-----+---------------+---------+--------+  POP Prox   48                          triphasic          +-----------+--------+-----+---------------+---------+--------+  POP Distal 100                         triphasic          +-----------+--------+-----+---------------+---------+--------+  ATA Distal 66                          triphasic          +-----------+--------+-----+---------------+---------+--------+  PTA Distal 64                          biphasic           +-----------+--------+-----+---------------+---------+--------+  PERO Distal97                          biphasic           +-----------+--------+-----+---------------+---------+--------+     Summary:  Right: 50-74% stenosis noted in the superficial femoral artery.    Palpable femoral pulses B, no right pedal pulses Lungs non labored breathing Ambulates with a left prothesis Heart RRR Abdomin NTTP, non distended     Assessment/Plan:  This is a 63 y.o. male who has signs of infection right second toe.  He will f/u with DR. Duda on Wednesday.  I think he  will need the toe amputated.  The duplex demonstrated, stenosis in the SFA.  The ABI's demonstrate monophasic wave forms with significant decreased toe pressure of 20.  I will schedule him for angiogram of the right LE with second order cath and possible angioplasty and or stent to improve  his chance of healing the toe or amputation site healing.     He will cont currently care to the right toe.  He would like to do everything to save the right leg.       Mosetta Pigeon PA-C [facility] [phone]     Call MD:  Randie Heinz

## 2023-10-28 ENCOUNTER — Encounter (HOSPITAL_COMMUNITY): Payer: Self-pay | Admitting: Vascular Surgery

## 2023-10-28 ENCOUNTER — Telehealth: Payer: Self-pay | Admitting: Family

## 2023-10-28 ENCOUNTER — Telehealth: Payer: Self-pay | Admitting: Orthopedic Surgery

## 2023-10-28 ENCOUNTER — Other Ambulatory Visit: Payer: Self-pay

## 2023-10-28 MED ORDER — DOXYCYCLINE HYCLATE 100 MG PO TABS: 100.0000 mg | ORAL_TABLET | Freq: Two times a day (BID) | ORAL | 0 refills | Status: DC

## 2023-10-28 NOTE — Telephone Encounter (Signed)
Pt called he would like to move forward with surgery. Pt states he called and left message for Elnita Maxwell and asking for a call back. Pt phone number is [phone].

## 2023-10-28 NOTE — Telephone Encounter (Signed)
I called pt ok per Dr. Lajoyce Corners to refill abx and follow up on Wednesday. Pt states that due to the bone exposure he just wants to amputate the toe. Pt had angioplasty with Dr. Chestine Spore and Dr. Lajoyce Corners has reviewed not and advised pt that we can get him on the sch for this coming Wednesday. Pt very satisfied with plan and surgical sheet is completed and advised Elnita Maxwell will call on Monday to sch.

## 2023-10-28 NOTE — Telephone Encounter (Signed)
Patient would like antibiotics called in for him until he can see Erin. His cb# is [phone]

## 2023-11-01 ENCOUNTER — Encounter (HOSPITAL_COMMUNITY): Payer: Self-pay | Admitting: Orthopedic Surgery

## 2023-11-01 ENCOUNTER — Other Ambulatory Visit: Payer: Self-pay

## 2023-11-01 NOTE — Progress Notes (Signed)
 Anesthesia Chart Review: SAME DAY WORK-UP  Case: [id number] Date/Time: 11/02/23 1019   Procedure: RIGHT 2ND TOE AMPUTATION (Right)   Anesthesia type: Choice   Pre-op diagnosis: Osteomyelitis Right 2nd Toe   [facility] / [facility]   Surgeons: Nadara Mustard, MD       DISCUSSION: Patient is a 63 year old male scheduled for the above procedure.  History includes former smoker (quit 07/20/20), HTN, HLD, DM2, AAA (4.3 cm 01/31/23 CTA), ileofemoral aneurysms (2.4 cm R CIA, 2.6 L CIA, 1.9 cm L IIA, 2.7 cm, R CFA 01/31/23 CTA, followed by vascular surgeon Dr. Chestine Spore), left popliteal artery aneurysm (s/p Viabahn covered stent placement for aneurysm exclusion 02/13/14), PAD (right 3rd toe amputation 11/07/18; left peroneal artery angioplasty 08/06/20; left TMA 08/20/20->left BKA 10/03/20; right PT artery angioplasty  10/26/13), osteoarthritis (s/p left THA 05/23/20; right THA 07/31/21).  OK to continue ASA and Plavix per Dr. Lajoyce Corners.   A1c 6.1% 08/30/23 [facility]). He was taking Jardiance but recently was switched to [place]. He reported last Farxiga was 11/01/23 AM. He was advised to hold until after surgery.    Patient is a same-day workup, anesthesia team to evaluate on the day of surgery.  VS:  Wt Readings from Last 3 Encounters:  10/27/23 106.6 kg  10/14/23 107.5 kg  10/04/23 108.5 kg   BP Readings from Last 3 Encounters:  10/27/23 134/82  10/14/23 102/68  10/04/23 (!) 155/89   Pulse Readings from Last 3 Encounters:  10/27/23 64  10/14/23 66  10/04/23 62     PROVIDERS: Melida Quitter, MD is PCP  Sherald Hess, MD is vascular surgeon  LABS: For day of surgery as indicated. Last results in [facility] include: Lab Results  Component Value Date   WBC 8.4 07/24/2021   HGB 13.6 10/27/2023   HCT 40.0 10/27/2023   GLUCOSE 143 (H) 10/27/2023   NA 138 10/27/2023   K 3.9 10/27/2023   CL 99 10/27/2023   CREATININE 1.20 10/27/2023   BUN 18 10/27/2023    IMAGES: CTA  Abd/pelvis 01/31/23: IMPRESSION: 1. 4.3 cm infrarenal abdominal aortic aneurysm. Recommend follow-up every 12 months and vascular consultation. This recommendation follows ACR consensus guidelines: White Paper of the ACR Incidental Findings Committee II on Vascular Findings. J Am Coll Radiol 2013; 10:789-794. 2. 2.4 cm right common iliac artery aneurysm, 2.6 cm left common iliac artery aneurysm, 1.9 cm left internal iliac artery aneurysm, and 2.7 cm right common femoral artery aneurysm. 3. Fatty infiltration of the liver. 4. Coronary and aortic Atherosclerosis (ICD10-I70.0).   CT Chest LCS 06/06/23 ([facility]): IMPRESSION: OVERALL LUNG-RADS CATEGORY: 1,S  BASED ON LESION: ID no pulmonary nodules.  MANAGEMENT RECOMMENDATION: Continue surveillance with a low-dose CT scan of the chest in 12 months  FOLLOW-UP DATE: 06/05/2024  S FINDINGS: Calcified plaque in the coronary arteries    EKG: 10/26/13: Normal sinus rhythm Right bundle branch block Abnormal ECG When compared with ECG of 24-Jul-2021 08:44, No significant change was found Confirmed by End, Cristal Deer [phone]) on 10/27/2023 6:56:02 PM   CV: [place] AAA 10/04/23: Abdominal Aorta Findings:  +-----------+-------+----------+----------+--------+--------+--------+  Location  AP (cm)Trans (cm)PSV (cm/s)WaveformThrombusComments  +-----------+-------+----------+----------+--------+--------+--------+  Proximal  3.23   3.18      100                                 +-----------+-------+----------+----------+--------+--------+--------+  Mid       4.29  4.28      51                                  +-----------+-------+----------+----------+--------+--------+--------+  Distal    2.69   3.25      60                                  +-----------+-------+----------+----------+--------+--------+--------+  RT CIA Prox1.9    2.3       38                                   +-----------+-------+----------+----------+--------+--------+--------+  LT CIA Prox2.4    2.7       49                                  +-----------+-------+----------+----------+--------+--------+--------+  Summary:  Abdominal Aorta: There is evidence of abnormal dilatation of the mid  Abdominal aorta. There is evidence of abnormal dilation of the Right  Common Iliac artery and Left Common Iliac artery. The largest aortic  diameter remains essentially unchanged compared   to prior exam. Previous diameter measurement was 4.3 cm obtained on  01/31/23 (CT angio).      Past Medical History:  Diagnosis Date   AAA (abdominal aortic aneurysm) (HCC)    Arthritis    Diabetes mellitus without complication (HCC)    type 2 controlled with diet    Diabetic toe ulcer (HCC) 11/05/2018   History of kidney stones    passed 1 several years ago   Hyperlipidemia    Hypertension    not on medications   Peripheral arterial disease (HCC)    Peripheral vascular disease (HCC)    stent in left leg due to anerysm     Past Surgical History:  Procedure Laterality Date    Left Transmetatarsal Ampuation (Left Foot)  08/20/2020   ABDOMINAL AORTOGRAM W/LOWER EXTREMITY N/A 08/06/2020   Procedure: ABDOMINAL AORTOGRAM W/LOWER EXTREMITY;  Surgeon: [name], MD;  [facility];  Service: Cardiovascular;  Laterality: N/A;   ABDOMINAL AORTOGRAM W/LOWER EXTREMITY N/A 10/27/2023   Procedure: ABDOMINAL AORTOGRAM W/LOWER EXTREMITY;  Surgeon: [name], MD;  [facility]: [facility];  Service: Cardiovascular;  Laterality: N/A;   AMPUTATION Right 11/07/2018   Procedure: RIGHT 3RD TOE AMPUTATION;  Surgeon: Nadara Mustard, MD;  Location: [facility] OR;  Service: Orthopedics;  Laterality: Right;   AMPUTATION Left 08/20/2020   Procedure: Left Transmetatarsal Ampuation;  Surgeon: [name], MD;  Location[facility] OR;  Service: Vascular;  Laterality: Left;   AMPUTATION Left  10/03/2020   Procedure: LEFT BELOW KNEE AMPUTATION;  Surgeon: [name], MD;  Location: [facility] OR;  Service: Vascular;  Laterality: Left;   AMPUTATION TOE Left    big toe and one next to it   CATARACT EXTRACTION Bilateral    EYE SURGERY Bilateral    cataracts removed   INSERTION OF ILIAC STENT Left    PERIPHERAL VASCULAR BALLOON ANGIOPLASTY Left 08/06/2020   Procedure: PERIPHERAL VASCULAR BALLOON ANGIOPLASTY;  Surgeon: [name], MD;  [facility];  Service: Cardiovascular;  Laterality: Left;  Peroneal artery.   PERIPHERAL VASCULAR BALLOON ANGIOPLASTY  10/27/2023  Procedure: PERIPHERAL VASCULAR BALLOON ANGIOPLASTY;  Surgeon: [name], MD;  [facility];  Service: Cardiovascular;;   TOE AMPUTATION Left 2015   TOTAL HIP ARTHROPLASTY Left 05/23/2020   Procedure: LEFT TOTAL HIP ARTHROPLASTY ANTERIOR APPROACH;  Surgeon: Kathryne Hitch, MD;  Location: [facility] ORS;  Service: Orthopedics;  Laterality: Left;   TOTAL HIP ARTHROPLASTY Right 07/31/2021   Procedure: RIGHT TOTAL HIP ARTHROPLASTY ANTERIOR APPROACH;  Surgeon: Kathryne Hitch, MD;  Location: [facility] ORS;  Service: Orthopedics;  Laterality: Right;   WOUND DEBRIDEMENT Left 09/11/2020   Procedure: DEBRIDEMENT OF LEFT TRANSMETATARSAL WOUND;  Surgeon: Leonie Douglas, MD;  Location: [facility] OR;  Service: Vascular;  Laterality: Left;    MEDICATIONS: No current facility-administered medications for this encounter.    acetaminophen (TYLENOL) 500 MG tablet   ascorbic acid (VITAMIN C) 500 MG tablet   aspirin EC 81 MG tablet   Berberine Chloride 500 MG CAPS   CITRUS BERGAMOT PO   clopidogrel (PLAVIX) 75 MG tablet   doxycycline (VIBRA-TABS) 100 MG tablet   EPIPEN 2-PAK 0.3 MG/0.3ML SOAJ injection   gabapentin (NEURONTIN) 300 MG capsule   hydrochlorothiazide (HYDRODIURIL) 25 MG tablet   JARDIANCE 10 MG TABS tablet   lisinopril (ZESTRIL) 40 MG tablet   methocarbamol (ROBAXIN) 500  MG tablet   Multiple Vitamin (MULTIVITAMIN WITH MINERALS) TABS tablet   mupirocin ointment (BACTROBAN) 2 %   olmesartan (BENICAR) 40 MG tablet   Red Yeast Rice 600 MG CAPS   rosuvastatin (CRESTOR) 20 MG tablet   tiZANidine (ZANAFLEX) 2 MG tablet   traMADol (ULTRAM) 50 MG tablet     Shonna Chock, PA-C Surgical Short Stay/Anesthesiology [facility] Phone [phone] [facility] Phone [phone] 11/01/2023 10:03 AM

## 2023-11-01 NOTE — Progress Notes (Signed)
 SDW CALL  Patient was given pre-op instructions over the phone. The opportunity was given for the patient to ask questions. No further questions asked. Patient verbalized understanding of instructions given.   PCP - Dorinda Hill Cardiologist - denies  PPM/ICD - denies Device Orders - n/a Rep Notified - n/a  Chest x-ray - denies EKG - 10/27/23 Stress Test - denies ECHO - denies Cardiac Cath - denies  Sleep Study - denies CPAP - n/a  Fasting Blood Sugar - 150's Checks Blood Sugar __1___ times a day  Patient was taking Jardiance but recently stopped it and is now taking [place].  Patient stated last dose was morning on 2/18 and is aware not to take it on 2/19.   Blood Thinner Instructions:  Per Dr. Lajoyce Corners, patient can continue Aspirin and Plavix   ERAS Protcol - clears until 0730 PRE-SURGERY Ensure or G2- n/a  COVID TEST- n/a   Anesthesia review: yes  Patient denies shortness of breath, fever, cough and chest pain over the phone call   All instructions explained to the patient, with a verbal understanding of the material. Patient agrees to go over the instructions while at home for a better understanding.

## 2023-11-01 NOTE — Anesthesia Preprocedure Evaluation (Addendum)
 Anesthesia Evaluation  Patient identified by MRN, date of birth, ID band Patient awake    Reviewed: Allergy & Precautions, NPO status , Patient's Chart, lab work & pertinent test results, reviewed documented beta blocker date and time   Airway Mallampati: II  TM Distance: >3 FB     Dental no notable dental hx. (+) Teeth Intact, Dental Advisory Given   Pulmonary former smoker   Pulmonary exam normal breath sounds clear to auscultation       Cardiovascular hypertension, Pt. on medications + Peripheral Vascular Disease  Normal cardiovascular exam Rhythm:Regular Rate:Normal  Hx/o AAA 4.3 cm  EKG 2/13 /25 NSR, RBBB pattern   Neuro/Psych Peripheral neuropathy  negative psych ROS   GI/Hepatic negative GI ROS, Neg liver ROS,,,  Endo/Other  diabetes, Well Controlled, Type 2, Oral Hypoglycemic Agents  Obesity HLD  Renal/GU Renal InsufficiencyRenal diseaseRemote Hx/o renal calculi  negative genitourinary   Musculoskeletal  (+) Arthritis , Osteoarthritis,  Osteomyelitis right 2nd toe S/P left BKA   Abdominal  (+) + obese  Peds  Hematology Placvix therapy- last dose   Anesthesia Other Findings   Reproductive/Obstetrics ED                             Anesthesia Physical Anesthesia Plan  ASA: 3  Anesthesia Plan: General   Post-op Pain Management: Minimal or no pain anticipated   Induction: Intravenous  PONV Risk Score and Plan: 3 and Treatment may vary due to age or medical condition, Midazolam and Ondansetron  Airway Management Planned: LMA  Additional Equipment: None  Intra-op Plan:   Post-operative Plan: Extubation in OR  Informed Consent: I have reviewed the patients History and Physical, chart, labs and discussed the procedure including the risks, benefits and alternatives for the proposed anesthesia with the patient or authorized representative who has indicated his/her  understanding and acceptance.     Dental advisory given  Plan Discussed with: CRNA and Anesthesiologist  Anesthesia Plan Comments: (PAT note written 11/01/2023 by Shonna Chock, PA-C.  )       Anesthesia Quick Evaluation

## 2023-11-01 NOTE — Progress Notes (Signed)
 Patient was called to be informed that the surgery time for tomorrow was changed to 08:30 o'clock. Patient was instructed to be at the hospital at 06:30 o'clock and stop drinking clear liquids at 05:30 o'clock. Patient verbalized understanding.

## 2023-11-02 ENCOUNTER — Other Ambulatory Visit: Payer: Self-pay

## 2023-11-02 ENCOUNTER — Ambulatory Visit (HOSPITAL_COMMUNITY): Payer: Self-pay | Admitting: Vascular Surgery

## 2023-11-02 ENCOUNTER — Ambulatory Visit (HOSPITAL_COMMUNITY)
Admission: RE | Admit: 2023-11-02 | Discharge: 2023-11-02 | Disposition: A | Discharge status: Home / Self Care | Payer: Medicare HMO | Source: Ambulatory Visit | Attending: Orthopedic Surgery | Admitting: Orthopedic Surgery

## 2023-11-02 ENCOUNTER — Ambulatory Visit: Payer: Medicare HMO | Admitting: Family

## 2023-11-02 ENCOUNTER — Ambulatory Visit (HOSPITAL_BASED_OUTPATIENT_CLINIC_OR_DEPARTMENT_OTHER): Payer: Medicare HMO | Admitting: Vascular Surgery

## 2023-11-02 ENCOUNTER — Encounter (HOSPITAL_COMMUNITY): Payer: Self-pay | Admitting: Orthopedic Surgery

## 2023-11-02 ENCOUNTER — Encounter (HOSPITAL_COMMUNITY)
Admission: RE | Disposition: A | Discharge status: Home / Self Care | Payer: Self-pay | Source: Ambulatory Visit | Attending: Orthopedic Surgery

## 2023-11-02 DIAGNOSIS — Z7902 Long term (current) use of antithrombotics/antiplatelets: Secondary | ICD-10-CM | POA: Insufficient documentation

## 2023-11-02 DIAGNOSIS — Z79899 Other long term (current) drug therapy: Secondary | ICD-10-CM | POA: Insufficient documentation

## 2023-11-02 DIAGNOSIS — I724 Aneurysm of artery of lower extremity: Secondary | ICD-10-CM | POA: Insufficient documentation

## 2023-11-02 DIAGNOSIS — E669 Obesity, unspecified: Secondary | ICD-10-CM | POA: Insufficient documentation

## 2023-11-02 DIAGNOSIS — Z89421 Acquired absence of other right toe(s): Secondary | ICD-10-CM | POA: Insufficient documentation

## 2023-11-02 DIAGNOSIS — E785 Hyperlipidemia, unspecified: Secondary | ICD-10-CM | POA: Insufficient documentation

## 2023-11-02 DIAGNOSIS — L97519 Non-pressure chronic ulcer of other part of right foot with unspecified severity: Secondary | ICD-10-CM | POA: Diagnosis not present

## 2023-11-02 DIAGNOSIS — Z8679 Personal history of other diseases of the circulatory system: Secondary | ICD-10-CM | POA: Diagnosis not present

## 2023-11-02 DIAGNOSIS — I10 Essential (primary) hypertension: Secondary | ICD-10-CM

## 2023-11-02 DIAGNOSIS — I700 Atherosclerosis of aorta: Secondary | ICD-10-CM | POA: Diagnosis not present

## 2023-11-02 DIAGNOSIS — I7143 Infrarenal abdominal aortic aneurysm, without rupture: Secondary | ICD-10-CM | POA: Insufficient documentation

## 2023-11-02 DIAGNOSIS — M1990 Unspecified osteoarthritis, unspecified site: Secondary | ICD-10-CM | POA: Insufficient documentation

## 2023-11-02 DIAGNOSIS — E1151 Type 2 diabetes mellitus with diabetic peripheral angiopathy without gangrene: Secondary | ICD-10-CM | POA: Diagnosis not present

## 2023-11-02 DIAGNOSIS — M869 Osteomyelitis, unspecified: Secondary | ICD-10-CM

## 2023-11-02 DIAGNOSIS — Z87891 Personal history of nicotine dependence: Secondary | ICD-10-CM | POA: Diagnosis not present

## 2023-11-02 DIAGNOSIS — N289 Disorder of kidney and ureter, unspecified: Secondary | ICD-10-CM | POA: Diagnosis not present

## 2023-11-02 DIAGNOSIS — Z7984 Long term (current) use of oral hypoglycemic drugs: Secondary | ICD-10-CM | POA: Diagnosis not present

## 2023-11-02 DIAGNOSIS — I4510 Unspecified right bundle-branch block: Secondary | ICD-10-CM | POA: Diagnosis not present

## 2023-11-02 DIAGNOSIS — Z89512 Acquired absence of left leg below knee: Secondary | ICD-10-CM | POA: Insufficient documentation

## 2023-11-02 DIAGNOSIS — E11621 Type 2 diabetes mellitus with foot ulcer: Secondary | ICD-10-CM | POA: Diagnosis not present

## 2023-11-02 DIAGNOSIS — E119 Type 2 diabetes mellitus without complications: Secondary | ICD-10-CM | POA: Diagnosis not present

## 2023-11-02 DIAGNOSIS — Z6831 Body mass index (BMI) 31.0-31.9, adult: Secondary | ICD-10-CM | POA: Insufficient documentation

## 2023-11-02 DIAGNOSIS — E1169 Type 2 diabetes mellitus with other specified complication: Secondary | ICD-10-CM | POA: Diagnosis not present

## 2023-11-02 DIAGNOSIS — Z95820 Peripheral vascular angioplasty status with implants and grafts: Secondary | ICD-10-CM | POA: Insufficient documentation

## 2023-11-02 DIAGNOSIS — K760 Fatty (change of) liver, not elsewhere classified: Secondary | ICD-10-CM | POA: Insufficient documentation

## 2023-11-02 DIAGNOSIS — E1142 Type 2 diabetes mellitus with diabetic polyneuropathy: Secondary | ICD-10-CM | POA: Insufficient documentation

## 2023-11-02 DIAGNOSIS — I2510 Atherosclerotic heart disease of native coronary artery without angina pectoris: Secondary | ICD-10-CM | POA: Insufficient documentation

## 2023-11-02 DIAGNOSIS — I723 Aneurysm of iliac artery: Secondary | ICD-10-CM | POA: Insufficient documentation

## 2023-11-02 HISTORY — PX: AMPUTATION: SHX166

## 2023-11-02 LAB — COMPREHENSIVE METABOLIC PANEL
ALT: 23 U/L (ref 0–44)
AST: 29 U/L (ref 15–41)
Albumin: 3.7 g/dL (ref 3.5–5.0)
Alkaline Phosphatase: 52 U/L (ref 38–126)
Anion gap: 11 (ref 5–15)
BUN: 20 mg/dL (ref 8–23)
CO2: 26 mmol/L (ref 22–32)
Calcium: 9.8 mg/dL (ref 8.9–10.3)
Chloride: 98 mmol/L (ref 98–111)
Creatinine, Ser: 1.01 mg/dL (ref 0.61–1.24)
GFR, Estimated: >60 mL/min (ref >60)
Glucose, Bld: 154 mg/dL — ABNORMAL HIGH (ref 70–99)
Potassium: 3.8 mmol/L (ref 3.5–5.1)
Sodium: 135 mmol/L (ref 135–145)
Total Bilirubin: 0.8 mg/dL (ref 0.0–1.2)
Total Protein: 7.2 g/dL (ref 6.5–8.1)

## 2023-11-02 LAB — GLUCOSE, CAPILLARY
Glucose-Capillary: 161 mg/dL — ABNORMAL HIGH (ref 70–99)
Glucose-Capillary: 131 mg/dL — ABNORMAL HIGH (ref 70–99)

## 2023-11-02 LAB — CBC
HCT: 40 % (ref 39.0–52.0)
Hemoglobin: 12.9 g/dL — ABNORMAL LOW (ref 13.0–17.0)
MCH: 28.2 pg (ref 26.0–34.0)
MCHC: 32.3 g/dL (ref 30.0–36.0)
MCV: 87.3 fL (ref 80.0–100.0)
Platelets: 255 10*3/uL (ref 150–400)
RBC: 4.58 MIL/uL (ref 4.22–5.81)
RDW: 13.1 % (ref 11.5–15.5)
WBC: 8.6 10*3/uL (ref 4.0–10.5)
nRBC: 0 % (ref 0.0–0.2)

## 2023-11-02 SURGERY — AMPUTATION DIGIT
Anesthesia: General | Site: Second Toe | Laterality: Right

## 2023-11-02 MED ORDER — VASHE WOUND IRRIGATION OPTIME
TOPICAL | Status: DC | PRN
  Administered 2023-11-02: 34 [oz_av]

## 2023-11-02 MED ORDER — CHLORHEXIDINE GLUCONATE 0.12 % MT SOLN
15.0000 mL | Freq: Once | OROMUCOSAL | Status: AC
  Administered 2023-11-02: 15 mL via OROMUCOSAL
  Filled 2023-11-02: qty 15

## 2023-11-02 MED ORDER — ONDANSETRON HCL 4 MG/2ML IJ SOLN: 4.0000 mg | Freq: Once | INTRAMUSCULAR | Status: DC | PRN

## 2023-11-02 MED ORDER — PROPOFOL 10 MG/ML IV BOLUS
INTRAVENOUS | Status: DC | PRN
Start: 2023-11-02 — End: 2023-11-02
  Administered 2023-11-02: 120 mg via INTRAVENOUS
  Administered 2023-11-02: 80 mg via INTRAVENOUS

## 2023-11-02 MED ORDER — MIDAZOLAM HCL 2 MG/2ML IJ SOLN
INTRAMUSCULAR | Status: AC
  Filled 2023-11-02: qty 2

## 2023-11-02 MED ORDER — PHENYLEPHRINE 80 MCG/ML (10ML) SYRINGE FOR IV PUSH (FOR BLOOD PRESSURE SUPPORT)
PREFILLED_SYRINGE | INTRAVENOUS | Status: DC | PRN
  Administered 2023-11-02: 80 ug via INTRAVENOUS

## 2023-11-02 MED ORDER — OXYCODONE HCL 5 MG PO TABS
5.0000 mg | ORAL_TABLET | Freq: Once | ORAL | Status: AC | PRN
  Administered 2023-11-02: 5 mg via ORAL

## 2023-11-02 MED ORDER — ONDANSETRON HCL 4 MG/2ML IJ SOLN
INTRAMUSCULAR | Status: DC | PRN
  Administered 2023-11-02: 4 mg via INTRAVENOUS

## 2023-11-02 MED ORDER — FENTANYL CITRATE (PF) 100 MCG/2ML IJ SOLN: 25.0000 ug | INTRAMUSCULAR | Status: DC | PRN

## 2023-11-02 MED ORDER — CEFAZOLIN SODIUM-DEXTROSE 2-4 GM/100ML-% IV SOLN
2.0000 g | INTRAVENOUS | Status: AC
  Administered 2023-11-02: 2 g via INTRAVENOUS
  Filled 2023-11-02: qty 100

## 2023-11-02 MED ORDER — LACTATED RINGERS IV SOLN: INTRAVENOUS | Status: DC

## 2023-11-02 MED ORDER — FENTANYL CITRATE (PF) 250 MCG/5ML IJ SOLN
INTRAMUSCULAR | Status: DC | PRN
  Administered 2023-11-02: 50 ug via INTRAVENOUS

## 2023-11-02 MED ORDER — BUPIVACAINE-EPINEPHRINE 0.25% -1:200000 IJ SOLN
INTRAMUSCULAR | Status: DC | PRN
  Administered 2023-11-02: 10 mL

## 2023-11-02 MED ORDER — OXYCODONE HCL 5 MG PO TABS
ORAL_TABLET | ORAL | Status: AC
  Filled 2023-11-02: qty 1

## 2023-11-02 MED ORDER — ORAL CARE MOUTH RINSE: 15.0000 mL | Freq: Once | OROMUCOSAL | Status: AC

## 2023-11-02 MED ORDER — MIDAZOLAM HCL 2 MG/2ML IJ SOLN
INTRAMUSCULAR | Status: DC | PRN
  Administered 2023-11-02: 1 mg via INTRAVENOUS

## 2023-11-02 MED ORDER — FENTANYL CITRATE (PF) 250 MCG/5ML IJ SOLN
INTRAMUSCULAR | Status: AC
  Filled 2023-11-02: qty 5

## 2023-11-02 MED ORDER — BUPIVACAINE-EPINEPHRINE (PF) 0.25% -1:200000 IJ SOLN
INTRAMUSCULAR | Status: AC
  Filled 2023-11-02: qty 30

## 2023-11-02 MED ORDER — LIDOCAINE 2% (20 MG/ML) 5 ML SYRINGE
INTRAMUSCULAR | Status: DC | PRN
  Administered 2023-11-02: 60 mg via INTRAVENOUS

## 2023-11-02 MED ORDER — PROPOFOL 10 MG/ML IV BOLUS
INTRAVENOUS | Status: AC
  Filled 2023-11-02: qty 20

## 2023-11-02 MED ORDER — OXYCODONE-ACETAMINOPHEN 5-325 MG PO TABS: 1.0000 | ORAL_TABLET | ORAL | 0 refills | Status: DC | PRN

## 2023-11-02 MED ORDER — 0.9 % SODIUM CHLORIDE (POUR BTL) OPTIME
TOPICAL | Status: DC | PRN
  Administered 2023-11-02: 1000 mL

## 2023-11-02 MED ORDER — OXYCODONE HCL 5 MG/5ML PO SOLN: 5.0000 mg | Freq: Once | ORAL | Status: AC | PRN

## 2023-11-02 SURGICAL SUPPLY — 28 items
BAG COUNTER SPONGE SURGICOUNT (BAG) ×1 IMPLANT
BLADE SURG 21 STRL SS (BLADE) ×1 IMPLANT
BNDG COHESIVE 4X5 TAN STRL (GAUZE/BANDAGES/DRESSINGS) ×1 IMPLANT
BNDG COHESIVE 6X5 TAN NS LF (GAUZE/BANDAGES/DRESSINGS) IMPLANT
BNDG GAUZE DERMACEA FLUFF 4 (GAUZE/BANDAGES/DRESSINGS) ×1 IMPLANT
CLEANSER WND VASHE 34 (WOUND CARE) IMPLANT
COVER SURGICAL LIGHT HANDLE (MISCELLANEOUS) ×2 IMPLANT
DRAPE U-SHAPE 47X51 STRL (DRAPES) ×1 IMPLANT
DRSG ADAPTIC 3X8 NADH LF (GAUZE/BANDAGES/DRESSINGS) IMPLANT
DURAPREP 26ML APPLICATOR (WOUND CARE) ×1 IMPLANT
ELECT REM PT RETURN 9FT ADLT (ELECTROSURGICAL) ×1 IMPLANT
ELECTRODE REM PT RTRN 9FT ADLT (ELECTROSURGICAL) ×1 IMPLANT
GAUZE PAD ABD 8X10 STRL (GAUZE/BANDAGES/DRESSINGS) ×1 IMPLANT
GAUZE SPONGE 4X4 12PLY STRL (GAUZE/BANDAGES/DRESSINGS) IMPLANT
GLOVE BIOGEL PI IND STRL 9 (GLOVE) ×1 IMPLANT
GLOVE SURG ORTHO 9.0 STRL STRW (GLOVE) ×1 IMPLANT
GOWN STRL REUS W/ TWL XL LVL3 (GOWN DISPOSABLE) ×2 IMPLANT
KIT BASIN OR (CUSTOM PROCEDURE TRAY) ×1 IMPLANT
KIT TURNOVER KIT B (KITS) ×1 IMPLANT
MANIFOLD NEPTUNE II (INSTRUMENTS) ×1 IMPLANT
NDL 22X1.5 STRL (OR ONLY) (MISCELLANEOUS) IMPLANT
NEEDLE 22X1.5 STRL (OR ONLY) (MISCELLANEOUS) IMPLANT
NS IRRIG 1000ML POUR BTL (IV SOLUTION) ×1 IMPLANT
PACK ORTHO EXTREMITY (CUSTOM PROCEDURE TRAY) ×1 IMPLANT
PAD ARMBOARD 7.5X6 YLW CONV (MISCELLANEOUS) ×2 IMPLANT
SUT ETHILON 2 0 PSLX (SUTURE) ×1 IMPLANT
SYR CONTROL 10ML LL (SYRINGE) IMPLANT
TOWEL GREEN STERILE (TOWEL DISPOSABLE) ×1 IMPLANT

## 2023-11-02 NOTE — Anesthesia Procedure Notes (Signed)
 Procedure Name: LMA Insertion Date/Time: 11/02/2023 8:28 AM  Performed by: Ayesha Rumpf, CRNAPre-anesthesia Checklist: Patient identified, Emergency Drugs available, Suction available and Patient being monitored Patient Re-evaluated:Patient Re-evaluated prior to induction Oxygen Delivery Method: Circle System Utilized Preoxygenation: Pre-oxygenation with 100% oxygen Induction Type: IV induction Ventilation: Mask ventilation without difficulty LMA: LMA inserted LMA Size: 5.0 Number of attempts: 1 Airway Equipment and Method: Bite block Placement Confirmation: positive ETCO2 Tube secured with: Tape Dental Injury: Teeth and Oropharynx as per pre-operative assessment

## 2023-11-02 NOTE — Anesthesia Postprocedure Evaluation (Signed)
 Anesthesia Post Note  Patient: Arlander Gillen Bring  Procedure(s) Performed: RIGHT 2ND TOE AMPUTATION (Right: Second Toe)     Patient location during evaluation: PACU Anesthesia Type: General Level of consciousness: awake and alert and oriented Pain management: pain level controlled Vital Signs Assessment: post-procedure vital signs reviewed and stable Respiratory status: spontaneous breathing, nonlabored ventilation and respiratory function stable Cardiovascular status: blood pressure returned to baseline and stable Postop Assessment: no apparent nausea or vomiting Anesthetic complications: no   No notable events documented.  Last Vitals:  Vitals:   11/02/23 0902 11/02/23 0915  BP: 118/74 114/86  Pulse: 65 62  Resp: (!) 9 19  Temp:    SpO2: 97% 94%    Last Pain:  Vitals:   11/02/23 0902  TempSrc:   PainSc: 0-No pain                 Darchelle Nunes A.

## 2023-11-02 NOTE — Op Note (Signed)
 11/02/2023  9:03 AM  PATIENT:  Anthony Park    PRE-OPERATIVE DIAGNOSIS:  Osteomyelitis Right 2nd Toe  POST-OPERATIVE DIAGNOSIS:  Same  PROCEDURE:  RIGHT 2ND TOE AMPUTATION  SURGEON:  Nadara Mustard, MD  PHYSICIAN ASSISTANT:None ANESTHESIA:   General  PREOPERATIVE INDICATIONS:  [name] is a  63 y.o. male with a diagnosis of Osteomyelitis Right 2nd Toe who failed conservative measures and elected for surgical management.    The risks benefits and alternatives were discussed with the patient preoperatively including but not limited to the risks of infection, bleeding, nerve injury, cardiopulmonary complications, the need for revision surgery, among others, and the patient was willing to proceed.  OPERATIVE IMPLANTS:   * No implants in log *  @ENCIMAGES @  OPERATIVE FINDINGS: Good petechial bleeding.  OPERATIVE PROCEDURE: Patient brought the operating room and underwent a general LMA anesthetic.  After adequate levels anesthesia obtained patient's right lower extremity was prepped using DuraPrep draped into a sterile field a timeout was called.  AV incision was made around the second toe and the toe was amputated through the MTP joint.  The wound margins were clear.  The wound was irrigated with Vashe.  Electrocautery was used hemostasis.  The incision was closed using 2-0 nylon.  Sterile dressing was applied patient was extubated taken the PACU in stable condition.  Patient underwent local infiltration with 10 cc of quarter percent Marcaine plain.   DISCHARGE PLANNING:  Antibiotic duration: Preoperative antibiotics  Weightbearing: Touchdown weightbearing on the right  Pain medication: Prescription for Percocet.  Dressing care/ Wound VAC: Dry dressing  Ambulatory devices: Crutches and postoperative shoe  Discharge to: Home.  Follow-up: In the office 1 week post operative.

## 2023-11-02 NOTE — H&P (Signed)
 MARQUIS DOWN is an 63 y.o. male.   Chief Complaint: Osteomyelitis and ulceration right second toe. HPI: Patient is a 63 year old gentleman with diabetes and peripheral vascular disease.  Patient is status post endovascular revascularization with Dr. Chestine Spore on February 13.  Patient presents at this time for amputation of the ulcerative second toe right foot.  Past Medical History:  Diagnosis Date   AAA (abdominal aortic aneurysm) (HCC)    Arthritis    Diabetes mellitus without complication (HCC)    type 2 controlled with diet    Diabetic toe ulcer (HCC) 11/05/2018   History of kidney stones    passed 1 several years ago   Hyperlipidemia    Hypertension    not on medications   Peripheral arterial disease (HCC)    Peripheral vascular disease (HCC)    stent in left leg due to anerysm     Past Surgical History:  Procedure Laterality Date    Left Transmetatarsal Ampuation (Left Foot)  08/20/2020   ABDOMINAL AORTOGRAM W/LOWER EXTREMITY N/A 08/06/2020   Procedure: ABDOMINAL AORTOGRAM W/LOWER EXTREMITY;  Surgeon: [name], MD;  [facility];  Service: Cardiovascular;  Laterality: N/A;   ABDOMINAL AORTOGRAM W/LOWER EXTREMITY N/A 10/27/2023   Procedure: ABDOMINAL AORTOGRAM W/LOWER EXTREMITY;  Surgeon: [name], MD;  [facility];  Service: Cardiovascular;  Laterality: N/A;   AMPUTATION Right 11/07/2018   Procedure: RIGHT 3RD TOE AMPUTATION;  Surgeon: Nadara Mustard, MD;  Location: [facility];  Service: Orthopedics;  Laterality: Right;   AMPUTATION Left 08/20/2020   Procedure: Left Transmetatarsal Ampuation;  Surgeon: [name], MD;  Location: [facility] OR;  Service: Vascular;  Laterality: Left;   AMPUTATION Left 10/03/2020   Procedure: LEFT BELOW KNEE AMPUTATION;  Surgeon: [name], MD;  [facility];  Service: Vascular;  Laterality: Left;   AMPUTATION TOE Left    big toe and one next to it   CATARACT EXTRACTION Bilateral     EYE SURGERY Bilateral    cataracts removed   INSERTION OF ILIAC STENT Left    PERIPHERAL VASCULAR BALLOON ANGIOPLASTY Left 08/06/2020   Procedure: PERIPHERAL VASCULAR BALLOON ANGIOPLASTY;  Surgeon: [name], MD;  [facility];  Service: Cardiovascular;  Laterality: Left;  Peroneal artery.   PERIPHERAL VASCULAR BALLOON ANGIOPLASTY  10/27/2023   Procedure: PERIPHERAL VASCULAR BALLOON ANGIOPLASTY;  Surgeon: [name], MD;  [facility];  Service: Cardiovascular;;   TOE AMPUTATION Left 2015   TOTAL HIP ARTHROPLASTY Left 05/23/2020   Procedure: LEFT TOTAL HIP ARTHROPLASTY ANTERIOR APPROACH;  Surgeon: Kathryne Hitch, MD;  Location: [facility];  Service: Orthopedics;  Laterality: Left;   TOTAL HIP ARTHROPLASTY Right 07/31/2021   Procedure: RIGHT TOTAL HIP ARTHROPLASTY ANTERIOR APPROACH;  Surgeon: Kathryne Hitch, MD;  Location: [facility];  Service: Orthopedics;  Laterality: Right;   WOUND DEBRIDEMENT Left 09/11/2020   Procedure: DEBRIDEMENT OF LEFT TRANSMETATARSAL WOUND;  Surgeon: Leonie Douglas, MD;  Location: [facility] OR;  Service: Vascular;  Laterality: Left;    Family History  Problem Relation Age of Onset   COPD Mother    Cataracts Mother    Brain cancer Father    Hypertension Brother    Social History:  reports that he quit smoking about 3 years ago. His smoking use included cigarettes. He started smoking about 44 years ago. He has a 20.5 pack-year smoking history. He has never used smokeless tobacco. He reports current  alcohol use of about 14.0 standard drinks of alcohol per week. He reports current drug use.  Allergies:  Allergies  Allergen Reactions   Meloxicam Nausea Only    Upset stomach     Medications Prior to Admission  Medication Sig Dispense Refill   dapagliflozin propanediol (FARXIGA) 5 MG TABS tablet Take 5 mg by mouth daily.     acetaminophen (TYLENOL) 500 MG tablet Take 1,000 mg by mouth every 6 (six)  hours as needed for moderate pain.     ascorbic acid (VITAMIN C) 500 MG tablet Take 500 mg by mouth daily.     aspirin EC 81 MG tablet Take 81 mg by mouth daily. Swallow whole.     Berberine Chloride 500 MG CAPS Take 500 mg by mouth daily.     CITRUS BERGAMOT PO Take 1 capsule by mouth daily.     clopidogrel (PLAVIX) 75 MG tablet Take 75 mg by mouth daily.     doxycycline (VIBRA-TABS) 100 MG tablet Take 1 tablet (100 mg total) by mouth 2 (two) times daily. 30 tablet 0   EPIPEN 2-PAK 0.3 MG/0.3ML SOAJ injection Inject 0.3 mg into the muscle as needed for anaphylaxis.     gabapentin (NEURONTIN) 300 MG capsule Take 600 mg by mouth 2 (two) times daily.     hydrochlorothiazide (HYDRODIURIL) 25 MG tablet Take 25 mg by mouth daily.     JARDIANCE 10 MG TABS tablet Take 10 mg by mouth daily.     lisinopril (ZESTRIL) 40 MG tablet Take 1 tablet (40 mg total) by mouth daily. 30 tablet 0   methocarbamol (ROBAXIN) 500 MG tablet Take 1 tablet (500 mg total) by mouth every 6 (six) hours as needed for muscle spasms. 30 tablet 0   Multiple Vitamin (MULTIVITAMIN WITH MINERALS) TABS tablet Take 1 tablet by mouth 3 (three) times a week.     mupirocin ointment (BACTROBAN) 2 % Apply 1 Application topically 2 (two) times daily.     olmesartan (BENICAR) 40 MG tablet Take 40 mg by mouth daily.     Red Yeast Rice 600 MG CAPS Take 600 mg by mouth daily.     rosuvastatin (CRESTOR) 20 MG tablet Take 20 mg by mouth at bedtime.     tiZANidine (ZANAFLEX) 2 MG tablet Take 2 mg by mouth at bedtime as needed for muscle spasms.     traMADol (ULTRAM) 50 MG tablet Take 1 tablet (50 mg total) by mouth every 6 (six) hours as needed. 30 tablet 0    No results found for this or any previous visit (from the past 48 hours). No results found.  Review of Systems  All other systems reviewed and are negative.   There were no vitals taken for this visit. Physical Exam  Patient is alert, oriented, no adenopathy, well-dressed, normal  affect, normal respiratory effort. On examination right foot the second toe has dorsal ulcer which is 6 mm in diameter with bone exposed in the wound bed there is mild surrounding erythema there is no else ascending cellulitis or sausage digit swelling Assessment/Plan Assessment: Ulceration osteomyelitis right foot second toe.  Plan: Will plan for right foot second ray amputation.  Risk and benefits were discussed including infection neurovascular injury nonhealing of the wound need for additional surgery.  Patient states he understands wished to proceed at this time.  Nadara Mustard, MD 11/02/2023, 6:37 AM

## 2023-11-02 NOTE — Progress Notes (Signed)
 Orthopedic Tech Progress Note Patient Details:  Anthony Park 1961/06/14 [id number]  Ortho Devices Type of Ortho Device: Postop shoe/boot Ortho Device/Splint Location: RLE Ortho Device/Splint Interventions: Ordered, Other (comment)  PACU RN called requesting a POST OP SHOE,    Post Interventions Patient Tolerated: Well Instructions Provided: Care of device  [name] 11/02/2023, 9:44 AM

## 2023-11-02 NOTE — Transfer of Care (Signed)
 Immediate Anesthesia Transfer of Care Note  Patient: Franciszek Platten Faughn  Procedure(s) Performed: RIGHT 2ND TOE AMPUTATION (Right: Second Toe)  Patient Location: PACU  Anesthesia Type:General  Level of Consciousness: awake, drowsy, patient cooperative, and responds to stimulation  Airway & Oxygen Therapy: Patient Spontanous Breathing  Post-op Assessment: Report given to RN and Post -op Vital signs reviewed and stable  Post vital signs: Reviewed and stable  Last Vitals:  Vitals Value Taken Time  BP 118/74 11/02/23 0901  Temp    Pulse 65 11/02/23 0902  Resp 9 11/02/23 0902  SpO2 97 % 11/02/23 0902  Vitals shown include unfiled device data.  Last Pain:  Vitals:   11/02/23 0721  TempSrc:   PainSc: 0-No pain         Complications: No notable events documented.

## 2023-11-03 ENCOUNTER — Encounter (HOSPITAL_COMMUNITY): Payer: Self-pay | Admitting: Orthopedic Surgery

## 2023-11-10 ENCOUNTER — Ambulatory Visit: Payer: Medicare HMO | Admitting: Orthopedic Surgery

## 2023-11-10 DIAGNOSIS — L97519 Non-pressure chronic ulcer of other part of right foot with unspecified severity: Secondary | ICD-10-CM

## 2023-11-17 ENCOUNTER — Ambulatory Visit: Payer: Medicare HMO | Admitting: Orthopedic Surgery

## 2023-11-17 ENCOUNTER — Encounter: Payer: Self-pay | Admitting: Orthopedic Surgery

## 2023-11-17 ENCOUNTER — Other Ambulatory Visit: Payer: Self-pay

## 2023-11-17 DIAGNOSIS — L97519 Non-pressure chronic ulcer of other part of right foot with unspecified severity: Secondary | ICD-10-CM

## 2023-11-17 DIAGNOSIS — I739 Peripheral vascular disease, unspecified: Secondary | ICD-10-CM

## 2023-11-17 NOTE — Progress Notes (Signed)
 Office Visit Note   Patient: Anthony Park           Date of Birth: 23-Sep-1960           MRN: [id number] Visit Date: 11/17/2023              Requested by: Melida Quitter, MD [address] [place],  [place] [address] PCP: Melida Quitter, MD  Chief Complaint  Patient presents with   Right Foot - Routine Post Op    11/02/23 right 2nd toe amputation      HPI: Patient is a 63 year old gentleman who is 2 weeks status post right foot second toe amputation.  Assessment & Plan: Visit Diagnoses:  1. Ulcer of toe of right foot, unspecified ulcer stage (HCC)     Plan: Plan to follow-up in 1 week to remove the sutures.  I will follow-up in the office 2 weeks after his follow-up.  Follow-Up Instructions: Return in about 1 week (around 11/24/2023).   Ortho Exam  Patient is alert, oriented, no adenopathy, well-dressed, normal affect, normal respiratory effort. Examination the patient is status post endovascular revascularization.  The amputation of the toe is healing well.  Patient is having subsidence problems with his prosthesis on the left and has a torn liner.  Patient states he would like to resume running.  Patient is an existing left transtibial  amputee.  Patient's current comorbidities are not expected to impact the ability to function with the prescribed prosthesis. Patient verbally communicates a strong desire to use a prosthesis. Patient currently requires mobility aids to ambulate without a prosthesis.  Expects not to use mobility aids with a new prosthesis. Patient is expected to resume or reach their K Level within 6 months. Patient was active before the amputation and independent with stairs, uneven terrain, varying cadence, and a community ambulator.  Patient is a K3 level ambulator that spends a lot of time walking around on uneven terrain over obstacles, up and down stairs, and ambulates with a variable cadence.     Imaging: No results found. No images are attached to  the encounter.  Labs: Lab Results  Component Value Date   HGBA1C 6.5 (H) 07/24/2021   HGBA1C 6.2 (H) 10/03/2020   HGBA1C 6.8 (H) 05/20/2020   CRP 3.8 (H) 10/03/2020   CRP 53.7 (H) 09/23/2020   LABURIC 6.9 07/29/2020   REPTSTATUS 09/16/2020 FINAL 09/11/2020   GRAMSTAIN  09/11/2020    RARE WBC PRESENT, PREDOMINANTLY MONONUCLEAR MODERATE GRAM NEGATIVE RODS FEW GRAM VARIABLE ROD    CULT  09/11/2020    MODERATE PSEUDOMONAS AERUGINOSA RARE ESCHERICHIA COLI NO ANAEROBES ISOLATED Performed at [facility], [address], [place], [place] [address]    LABORGA PSEUDOMONAS AERUGINOSA 09/11/2020   LABORGA ESCHERICHIA COLI 09/11/2020     Lab Results  Component Value Date   ALBUMIN 3.7 11/02/2023   ALBUMIN 3.4 (L) 10/03/2020   ALBUMIN 3.6 09/11/2020    Lab Results  Component Value Date   MG 1.9 08/24/2020   MG 1.6 (L) 08/23/2020   No results found for: "VD25OH"  No results found for: "PREALBUMIN"    Latest Ref Rng & Units 11/02/2023    6:42 AM 10/27/2023    9:00 AM 07/24/2021    8:38 AM  CBC EXTENDED  WBC 4.0 - 10.5 K/uL 8.6   8.4   RBC 4.22 - 5.81 MIL/uL 4.58   4.89   Hemoglobin 13.0 - 17.0 g/dL 30.1  60.1  09.3  HCT 39.0 - 52.0 % 40.0  40.0  40.4   Platelets 150 - 400 K/uL 255   240      There is no height or weight on file to calculate BMI.  Orders:  No orders of the defined types were placed in this encounter.  No orders of the defined types were placed in this encounter.    Procedures: No procedures performed  Clinical Data: No additional findings.  ROS:  All other systems negative, except as noted in the HPI. Review of Systems  Objective: Vital Signs: There were no vitals taken for this visit.  Specialty Comments:  No specialty comments available.  PMFS History: Patient Active Problem List   Diagnosis Date Noted   Osteomyelitis of second toe of right foot (HCC) 11/02/2023   Hypogonadism, male 01/19/2023   Erectile dysfunction  01/19/2023   Aneurysm of abdominal vessel (HCC) 01/19/2023   Chronic kidney disease, stage 3a (HCC) 01/19/2023   AAA (abdominal aortic aneurysm) without rupture (HCC) 01/18/2023   S/P BKA (below knee amputation) unilateral, left (HCC) 10/03/2020   Non-healing wound of lower extremity 10/03/2020   History of amputation of left foot through metatarsal bone (HCC) 09/11/2020   PAD (peripheral artery disease) (HCC) 08/20/2020   Status post total replacement of left hip 06/05/2020   Unilateral primary osteoarthritis, right hip 04/17/2020   Unilateral primary osteoarthritis, left hip 04/17/2020   Amputated toe, right (HCC) 11/22/2018   Type 2 diabetes mellitus with foot ulcer, without long-term current use of insulin (HCC) 11/22/2018   Mixed hyperlipidemia 11/22/2018   Aneurysm of left popliteal artery (HCC) 11/22/2018   Status post peripheral artery angioplasty with insertion of stent 11/22/2018   Osteomyelitis of third toe of right foot (HCC)    Cellulitis of third toe of right foot    Past Medical History:  Diagnosis Date   AAA (abdominal aortic aneurysm) (HCC)    Arthritis    Diabetes mellitus without complication (HCC)    type 2 controlled with diet    Diabetic toe ulcer (HCC) 11/05/2018   History of kidney stones    passed 1 several years ago   Hyperlipidemia    Hypertension    not on medications   Peripheral arterial disease (HCC)    Peripheral vascular disease (HCC)    stent in left leg due to anerysm     Family History  Problem Relation Age of Onset   COPD Mother    Cataracts Mother    Brain cancer Father    Hypertension Brother     Past Surgical History:  Procedure Laterality Date    Left Transmetatarsal Ampuation (Left Foot)  08/20/2020   ABDOMINAL AORTOGRAM W/LOWER EXTREMITY N/A 08/06/2020   Procedure: ABDOMINAL AORTOGRAM W/LOWER EXTREMITY;  Surgeon: [name], MD;  [facility];  Service: Cardiovascular;  Laterality: N/A;   ABDOMINAL  AORTOGRAM W/LOWER EXTREMITY N/A 10/27/2023   Procedure: ABDOMINAL AORTOGRAM W/LOWER EXTREMITY;  Surgeon: [name], MD;  [facility];  Service: Cardiovascular;  Laterality: N/A;   AMPUTATION Right 11/07/2018   Procedure: RIGHT 3RD TOE AMPUTATION;  Surgeon: Nadara Mustard, MD;  Location: [facility] OR;  Service: Orthopedics;  Laterality: Right;   AMPUTATION Left 08/20/2020   Procedure: Left Transmetatarsal Ampuation;  Surgeon: [name], MD;  Location: [facility] OR;  Service: Vascular;  Laterality: Left;   AMPUTATION Left 10/03/2020   Procedure: LEFT BELOW KNEE AMPUTATION;  Surgeon: [name], MD;  [facility];  Service: Vascular;  Laterality: Left;   AMPUTATION Right 11/02/2023   Procedure: RIGHT 2ND TOE AMPUTATION;  Surgeon: Nadara Mustard, MD;  Location: [facility] OR;  Service: Orthopedics;  Laterality: Right;   AMPUTATION TOE Left    big toe and one next to it   CATARACT EXTRACTION Bilateral    EYE SURGERY Bilateral    cataracts removed   INSERTION OF ILIAC STENT Left    PERIPHERAL VASCULAR BALLOON ANGIOPLASTY Left 08/06/2020   Procedure: PERIPHERAL VASCULAR BALLOON ANGIOPLASTY;  Surgeon: [name], MD;  [facility];  Service: Cardiovascular;  Laterality: Left;  Peroneal artery.   PERIPHERAL VASCULAR BALLOON ANGIOPLASTY  10/27/2023   Procedure: PERIPHERAL VASCULAR BALLOON ANGIOPLASTY;  Surgeon: [name], MD;  [facility];  Service: Cardiovascular;;   TOE AMPUTATION Left 2015   TOTAL HIP ARTHROPLASTY Left 05/23/2020   Procedure: LEFT TOTAL HIP ARTHROPLASTY ANTERIOR APPROACH;  Surgeon: Kathryne Hitch, MD;  Location: [facility];  Service: Orthopedics;  Laterality: Left;   TOTAL HIP ARTHROPLASTY Right 07/31/2021   Procedure: RIGHT TOTAL HIP ARTHROPLASTY ANTERIOR APPROACH;  Surgeon: Kathryne Hitch, MD;  Location: [facility];  Service: Orthopedics;  Laterality: Right;   WOUND DEBRIDEMENT Left  09/11/2020   Procedure: DEBRIDEMENT OF LEFT TRANSMETATARSAL WOUND;  Surgeon: Leonie Douglas, MD;  Location: [facility] OR;  Service: Vascular;  Laterality: Left;   Social History   Occupational History   Not on file  Tobacco Use   Smoking status: Former    Current packs/day: 0.00    Average packs/day: 0.5 packs/day for 41.0 years (20.5 ttl pk-yrs)    Types: Cigarettes    Start date: 07/21/1979    Quit date: 07/20/2020    Years since quitting: 3.3   Smokeless tobacco: Never  Vaping Use   Vaping status: Never Used  Substance and Sexual Activity   Alcohol use: Yes    Alcohol/week: 14.0 standard drinks of alcohol    Types: 14 Standard drinks or equivalent per week    Comment: daily   Drug use: Yes    Comment: marijuana   Sexual activity: Yes

## 2023-11-19 ENCOUNTER — Encounter: Payer: Self-pay | Admitting: Orthopedic Surgery

## 2023-11-19 NOTE — Progress Notes (Signed)
 Office Visit Note   Patient: Anthony Park           Date of Birth: Oct 17, 1960           MRN: [id number] Visit Date: 11/10/2023              Requested by: Melida Quitter, MD [address] [place],  [place] [address] PCP: Melida Quitter, MD  Chief Complaint  Patient presents with   Right Foot - Routine Post Op    11/02/23 right 2nd toe amp      HPI: Patient is a 63 year old gentleman status post right foot second toe amputation fibroid 19th.  He is 1 week out from surgery.  Assessment & Plan: Visit Diagnoses:  1. Ulcer of toe of right foot, unspecified ulcer stage (HCC)     Plan: Plan to follow-up in 1 week to remove the sutures.  Activities as tolerated.  Follow-Up Instructions: Return in about 1 week (around 11/17/2023).   Ortho Exam  Patient is alert, oriented, no adenopathy, well-dressed, normal affect, normal respiratory effort. Examination the incision is well-healed there is no cellulitis or drainage.  Imaging: No results found. No images are attached to the encounter.  Labs: Lab Results  Component Value Date   HGBA1C 6.5 (H) 07/24/2021   HGBA1C 6.2 (H) 10/03/2020   HGBA1C 6.8 (H) 05/20/2020   CRP 3.8 (H) 10/03/2020   CRP 53.7 (H) 09/23/2020   LABURIC 6.9 07/29/2020   REPTSTATUS 09/16/2020 FINAL 09/11/2020   GRAMSTAIN  09/11/2020    RARE WBC PRESENT, PREDOMINANTLY MONONUCLEAR MODERATE GRAM NEGATIVE RODS FEW GRAM VARIABLE ROD    CULT  09/11/2020    MODERATE PSEUDOMONAS AERUGINOSA RARE ESCHERICHIA COLI NO ANAEROBES ISOLATED Performed at [facility], [address] [place], [place] [address]    LABORGA PSEUDOMONAS AERUGINOSA 09/11/2020   LABORGA ESCHERICHIA COLI 09/11/2020     Lab Results  Component Value Date   ALBUMIN 3.7 11/02/2023   ALBUMIN 3.4 (L) 10/03/2020   ALBUMIN 3.6 09/11/2020    Lab Results  Component Value Date   MG 1.9 08/24/2020   MG 1.6 (L) 08/23/2020   No results found for: "VD25OH"  No results found for:  "PREALBUMIN"    Latest Ref Rng & Units 11/02/2023    6:42 AM 10/27/2023    9:00 AM 07/24/2021    8:38 AM  CBC EXTENDED  WBC 4.0 - 10.5 K/uL 8.6   8.4   RBC 4.22 - 5.81 MIL/uL 4.58   4.89   Hemoglobin 13.0 - 17.0 g/dL 86.5  78.4  69.6   HCT 39.0 - 52.0 % 40.0  40.0  40.4   Platelets 150 - 400 K/uL 255   240      There is no height or weight on file to calculate BMI.  Orders:  No orders of the defined types were placed in this encounter.  No orders of the defined types were placed in this encounter.    Procedures: No procedures performed  Clinical Data: No additional findings.  ROS:  All other systems negative, except as noted in the HPI. Review of Systems  Objective: Vital Signs: There were no vitals taken for this visit.  Specialty Comments:  No specialty comments available.  PMFS History: Patient Active Problem List   Diagnosis Date Noted   Osteomyelitis of second toe of right foot (HCC) 11/02/2023   Hypogonadism, male 01/19/2023   Erectile dysfunction 01/19/2023   Aneurysm of abdominal vessel (HCC) 01/19/2023  Chronic kidney disease, stage 3a (HCC) 01/19/2023   AAA (abdominal aortic aneurysm) without rupture (HCC) 01/18/2023   S/P BKA (below knee amputation) unilateral, left (HCC) 10/03/2020   Non-healing wound of lower extremity 10/03/2020   History of amputation of left foot through metatarsal bone (HCC) 09/11/2020   PAD (peripheral artery disease) (HCC) 08/20/2020   Status post total replacement of left hip 06/05/2020   Unilateral primary osteoarthritis, right hip 04/17/2020   Unilateral primary osteoarthritis, left hip 04/17/2020   Amputated toe, right (HCC) 11/22/2018   Type 2 diabetes mellitus with foot ulcer, without long-term current use of insulin (HCC) 11/22/2018   Mixed hyperlipidemia 11/22/2018   Aneurysm of left popliteal artery (HCC) 11/22/2018   Status post peripheral artery angioplasty with insertion of stent 11/22/2018   Osteomyelitis of  third toe of right foot (HCC)    Cellulitis of third toe of right foot    Past Medical History:  Diagnosis Date   AAA (abdominal aortic aneurysm) (HCC)    Arthritis    Diabetes mellitus without complication (HCC)    type 2 controlled with diet    Diabetic toe ulcer (HCC) 11/05/2018   History of kidney stones    passed 1 several years ago   Hyperlipidemia    Hypertension    not on medications   Peripheral arterial disease (HCC)    Peripheral vascular disease (HCC)    stent in left leg due to anerysm     Family History  Problem Relation Age of Onset   COPD Mother    Cataracts Mother    Brain cancer Father    Hypertension Brother     Past Surgical History:  Procedure Laterality Date    Left Transmetatarsal Ampuation (Left Foot)  08/20/2020   ABDOMINAL AORTOGRAM W/LOWER EXTREMITY N/A 08/06/2020   Procedure: ABDOMINAL AORTOGRAM W/LOWER EXTREMITY;  Surgeon: [name], MD;  [facility];  Service: Cardiovascular;  Laterality: N/A;   ABDOMINAL AORTOGRAM W/LOWER EXTREMITY N/A 10/27/2023   Procedure: ABDOMINAL AORTOGRAM W/LOWER EXTREMITY;  Surgeon: [name], MD;  [facility];  Service: Cardiovascular;  Laterality: N/A;   AMPUTATION Right 11/07/2018   Procedure: RIGHT 3RD TOE AMPUTATION;  Surgeon: Nadara Mustard, MD;  Location: [facility] OR;  Service: Orthopedics;  Laterality: Right;   AMPUTATION Left 08/20/2020   Procedure: Left Transmetatarsal Ampuation;  Surgeon: [name], MD;  Location: [facility] OR;  Service: Vascular;  Laterality: Left;   AMPUTATION Left 10/03/2020   Procedure: LEFT BELOW KNEE AMPUTATION;  Surgeon: [name], MD;  Location: [facility] OR;  Service: Vascular;  Laterality: Left;   AMPUTATION Right 11/02/2023   Procedure: RIGHT 2ND TOE AMPUTATION;  Surgeon: Nadara Mustard, MD;  Location: [facility] OR;  Service: Orthopedics;  Laterality: Right;   AMPUTATION TOE Left    big toe and one next to it   CATARACT EXTRACTION  Bilateral    EYE SURGERY Bilateral    cataracts removed   INSERTION OF ILIAC STENT Left    PERIPHERAL VASCULAR BALLOON ANGIOPLASTY Left 08/06/2020   Procedure: PERIPHERAL VASCULAR BALLOON ANGIOPLASTY;  Surgeon: [name], MD;  [facility];  Service: Cardiovascular;  Laterality: Left;  Peroneal artery.   PERIPHERAL VASCULAR BALLOON ANGIOPLASTY  10/27/2023   Procedure: PERIPHERAL VASCULAR BALLOON ANGIOPLASTY;  Surgeon: [name], MD;  [facility];  Service: Cardiovascular;;   TOE AMPUTATION Left 2015   TOTAL HIP ARTHROPLASTY Left 05/23/2020   Procedure:  LEFT TOTAL HIP ARTHROPLASTY ANTERIOR APPROACH;  Surgeon: Kathryne Hitch, MD;  Location: [facility];  Service: Orthopedics;  Laterality: Left;   TOTAL HIP ARTHROPLASTY Right 07/31/2021   Procedure: RIGHT TOTAL HIP ARTHROPLASTY ANTERIOR APPROACH;  Surgeon: Kathryne Hitch, MD;  Location: [facility];  Service: Orthopedics;  Laterality: Right;   WOUND DEBRIDEMENT Left 09/11/2020   Procedure: DEBRIDEMENT OF LEFT TRANSMETATARSAL WOUND;  Surgeon: Leonie Douglas, MD;  Location: [facility] OR;  Service: Vascular;  Laterality: Left;   Social History   Occupational History   Not on file  Tobacco Use   Smoking status: Former    Current packs/day: 0.00    Average packs/day: 0.5 packs/day for 41.0 years (20.5 ttl pk-yrs)    Types: Cigarettes    Start date: 07/21/1979    Quit date: 07/20/2020    Years since quitting: 3.3   Smokeless tobacco: Never  Vaping Use   Vaping status: Never Used  Substance and Sexual Activity   Alcohol use: Yes    Alcohol/week: 14.0 standard drinks of alcohol    Types: 14 Standard drinks or equivalent per week    Comment: daily   Drug use: Yes    Comment: marijuana   Sexual activity: Yes

## 2023-11-22 ENCOUNTER — Ambulatory Visit: Payer: Medicare HMO | Admitting: Vascular Surgery

## 2023-11-22 ENCOUNTER — Encounter (HOSPITAL_COMMUNITY): Payer: Medicare HMO

## 2023-11-23 ENCOUNTER — Ambulatory Visit (HOSPITAL_COMMUNITY)
Admission: RE | Admit: 2023-11-23 | Discharge: 2023-11-23 | Disposition: A | Discharge status: Home / Self Care | Payer: Medicare HMO | Source: Ambulatory Visit | Attending: Vascular Surgery | Admitting: Vascular Surgery

## 2023-11-23 ENCOUNTER — Ambulatory Visit (INDEPENDENT_AMBULATORY_CARE_PROVIDER_SITE_OTHER)
Admit: 2023-11-23 | Discharge: 2023-11-23 | Disposition: A | Discharge status: Home / Self Care | Payer: Medicare HMO | Attending: Vascular Surgery | Admitting: Vascular Surgery

## 2023-11-23 DIAGNOSIS — I739 Peripheral vascular disease, unspecified: Secondary | ICD-10-CM | POA: Diagnosis not present

## 2023-11-23 LAB — VAS US ABI WITH/WO TBI: Right ABI: 1.13

## 2023-11-25 ENCOUNTER — Ambulatory Visit (INDEPENDENT_AMBULATORY_CARE_PROVIDER_SITE_OTHER): Admitting: Family

## 2023-11-25 DIAGNOSIS — L97519 Non-pressure chronic ulcer of other part of right foot with unspecified severity: Secondary | ICD-10-CM

## 2023-11-25 NOTE — Progress Notes (Signed)
 Post-Op Visit Note   Patient: Anthony Park           Date of Birth: Jan 23, 1961           MRN: [id number] Visit Date: 11/25/2023 PCP: Melida Quitter, MD  Chief Complaint: No chief complaint on file.   HPI:  HPI The patient is a 63 year old gentleman seen status post second toe amputation of the right foot Ortho Exam On examination right foot the incisions well-approximated sutures there is no gaping or drainage no erythema  Visit Diagnoses: No diagnosis found.  Plan: Minimize weightbearing.  Begin daily dose of cleansing dry dressings he will follow-up once more 2 weeks anticipate releasing him at that time  Follow-Up Instructions: No follow-ups on file.   Imaging: No results found.  Orders:  No orders of the defined types were placed in this encounter.  No orders of the defined types were placed in this encounter.    PMFS History: Patient Active Problem List   Diagnosis Date Noted   Osteomyelitis of second toe of right foot (HCC) 11/02/2023   Hypogonadism, male 01/19/2023   Erectile dysfunction 01/19/2023   Aneurysm of abdominal vessel (HCC) 01/19/2023   Chronic kidney disease, stage 3a (HCC) 01/19/2023   AAA (abdominal aortic aneurysm) without rupture (HCC) 01/18/2023   S/P BKA (below knee amputation) unilateral, left (HCC) 10/03/2020   Non-healing wound of lower extremity 10/03/2020   History of amputation of left foot through metatarsal bone (HCC) 09/11/2020   PAD (peripheral artery disease) (HCC) 08/20/2020   Status post total replacement of left hip 06/05/2020   Unilateral primary osteoarthritis, right hip 04/17/2020   Unilateral primary osteoarthritis, left hip 04/17/2020   Amputated toe, right (HCC) 11/22/2018   Type 2 diabetes mellitus with foot ulcer, without long-term current use of insulin (HCC) 11/22/2018   Mixed hyperlipidemia 11/22/2018   Aneurysm of left popliteal artery (HCC) 11/22/2018   Status post peripheral artery angioplasty with insertion  of stent 11/22/2018   Osteomyelitis of third toe of right foot (HCC)    Cellulitis of third toe of right foot    Past Medical History:  Diagnosis Date   AAA (abdominal aortic aneurysm) (HCC)    Arthritis    Diabetes mellitus without complication (HCC)    type 2 controlled with diet    Diabetic toe ulcer (HCC) 11/05/2018   History of kidney stones    passed 1 several years ago   Hyperlipidemia    Hypertension    not on medications   Peripheral arterial disease (HCC)    Peripheral vascular disease (HCC)    stent in left leg due to anerysm     Family History  Problem Relation Age of Onset   COPD Mother    Cataracts Mother    Brain cancer Father    Hypertension Brother     Past Surgical History:  Procedure Laterality Date    Left Transmetatarsal Ampuation (Left Foot)  08/20/2020   ABDOMINAL AORTOGRAM W/LOWER EXTREMITY N/A 08/06/2020   Procedure: ABDOMINAL AORTOGRAM W/LOWER EXTREMITY;  Surgeon: [name], MD;  Location: [facility];  Service: Cardiovascular;  Laterality: N/A;   ABDOMINAL AORTOGRAM W/LOWER EXTREMITY N/A 10/27/2023   Procedure: ABDOMINAL AORTOGRAM W/LOWER EXTREMITY;  Surgeon: [name], MD;  Location: [facility];  Service: Cardiovascular;  Laterality: N/A;   AMPUTATION Right 11/07/2018   Procedure: RIGHT 3RD TOE AMPUTATION;  Surgeon: Nadara Mustard, MD;  Location: [facility] OR;  Service: Orthopedics;  Laterality:  Right;   AMPUTATION Left 08/20/2020   Procedure: Left Transmetatarsal Ampuation;  Surgeon: [name], MD;  Location: [facility] OR;  Service: Vascular;  Laterality: Left;   AMPUTATION Left 10/03/2020   Procedure: LEFT BELOW KNEE AMPUTATION;  Surgeon: [name], MD;  Location: [facility] OR;  Service: Vascular;  Laterality: Left;   AMPUTATION Right 11/02/2023   Procedure: RIGHT 2ND TOE AMPUTATION;  Surgeon: Nadara Mustard, MD;  Location: [facility] OR;  Service: Orthopedics;  Laterality: Right;   AMPUTATION TOE Left    big toe and  one next to it   CATARACT EXTRACTION Bilateral    EYE SURGERY Bilateral    cataracts removed   INSERTION OF ILIAC STENT Left    PERIPHERAL VASCULAR BALLOON ANGIOPLASTY Left 08/06/2020   Procedure: PERIPHERAL VASCULAR BALLOON ANGIOPLASTY;  Surgeon: [name], MD;  Location: [facility];  Service: Cardiovascular;  Laterality: Left;  Peroneal artery.   PERIPHERAL VASCULAR BALLOON ANGIOPLASTY  10/27/2023   Procedure: PERIPHERAL VASCULAR BALLOON ANGIOPLASTY;  Surgeon: [name], MD;  Location: [facility];  Service: Cardiovascular;;   TOE AMPUTATION Left 2015   TOTAL HIP ARTHROPLASTY Left 05/23/2020   Procedure: LEFT TOTAL HIP ARTHROPLASTY ANTERIOR APPROACH;  Surgeon: Kathryne Hitch, MD;  Location: [facility];  Service: Orthopedics;  Laterality: Left;   TOTAL HIP ARTHROPLASTY Right 07/31/2021   Procedure: RIGHT TOTAL HIP ARTHROPLASTY ANTERIOR APPROACH;  Surgeon: Kathryne Hitch, MD;  Location: [facility];  Service: Orthopedics;  Laterality: Right;   WOUND DEBRIDEMENT Left 09/11/2020   Procedure: DEBRIDEMENT OF LEFT TRANSMETATARSAL WOUND;  Surgeon: Leonie Douglas, MD;  Location: [facility] OR;  Service: Vascular;  Laterality: Left;   Social History   Occupational History   Not on file  Tobacco Use   Smoking status: Former    Current packs/day: 0.00    Average packs/day: 0.5 packs/day for 41.0 years (20.5 ttl pk-yrs)    Types: Cigarettes    Start date: 07/21/1979    Quit date: 07/20/2020    Years since quitting: 3.3   Smokeless tobacco: Never  Vaping Use   Vaping status: Never Used  Substance and Sexual Activity   Alcohol use: Yes    Alcohol/week: 14.0 standard drinks of alcohol    Types: 14 Standard drinks or equivalent per week    Comment: daily   Drug use: Yes    Comment: marijuana   Sexual activity: Yes

## 2023-11-28 ENCOUNTER — Encounter: Payer: Self-pay | Admitting: Family

## 2023-11-28 NOTE — Progress Notes (Unsigned)
 HISTORY AND PHYSICAL     CC:  follow up. Requesting Provider:  Melida Quitter, MD  HPI: This is a 63 y.o. male who is here today for follow up for PAD.  Pt has hx of diabetes, hypertension, hyperlipidemia, peripheral vascular disease who presents for 38-month follow-up for evaluation of AAA.  Previously seen with concern for a 5.4 cm AAA on ultrasound on 01/06/2023.  He was sent for CTA that showed a 4.3 cm AAA.   On 10/27/2023, he underwent angiogram with right PTA angioplasty via left CFA by Dr. Chestine Spore.   He has a complex history and previously had a left popliteal stent at [facility] for popliteal aneurysm. We saw him with tissue loss and ultimately he had a left peroneal angioplasty on 08/06/2020 with tibial and small vessel disease in the setting of CLI. Ultimately required a TMA on 08/20/2020 and then TMA debridement and ultimately this became nonhealing and necrotic. He was eventually converted to a BKA. He is walking with a prosthesis with his left leg now.   The pt returns today for follow up.  He states he is doing well.  He had his toe amputated by Dr. Lajoyce Corners and he says his PA removed the stitches and it is healing.  He has f/u with Dr. Lajoyce Corners later this week.  He states that he has an appt to meet with Brett Canales at [place] to get prosthesis that is conducive to running.  He does not have claudication or rest pain.   He is compliant with his asa/plavix/statin.  The pt is on a statin for cholesterol management.    The pt is on an aspirin.    Other AC:  Plavix The pt is on ACEI, diuretic for hypertension.  The pt is  on medication for diabetes. Tobacco hx:  former    Past Medical History:  Diagnosis Date   AAA (abdominal aortic aneurysm) (HCC)    Arthritis    Diabetes mellitus without complication (HCC)    type 2 controlled with diet    Diabetic toe ulcer (HCC) 11/05/2018   History of kidney stones    passed 1 several years ago   Hyperlipidemia    Hypertension    not on medications    Peripheral arterial disease (HCC)    Peripheral vascular disease (HCC)    stent in left leg due to anerysm     Past Surgical History:  Procedure Laterality Date    Left Transmetatarsal Ampuation (Left Foot)  08/20/2020   ABDOMINAL AORTOGRAM W/LOWER EXTREMITY N/A 08/06/2020   Procedure: ABDOMINAL AORTOGRAM W/LOWER EXTREMITY;  Surgeon: [name], MD;  Location: [facility];  Service: Cardiovascular;  Laterality: N/A;   ABDOMINAL AORTOGRAM W/LOWER EXTREMITY N/A 10/27/2023   Procedure: ABDOMINAL AORTOGRAM W/LOWER EXTREMITY;  Surgeon: [name], MD;  Location: [facility];  Service: Cardiovascular;  Laterality: N/A;   AMPUTATION Right 11/07/2018   Procedure: RIGHT 3RD TOE AMPUTATION;  Surgeon: Nadara Mustard, MD;  Location: [facility] OR;  Service: Orthopedics;  Laterality: Right;   AMPUTATION Left 08/20/2020   Procedure: Left Transmetatarsal Ampuation;  Surgeon: [name], MD;  Location: [facility] OR;  Service: Vascular;  Laterality: Left;   AMPUTATION Left 10/03/2020   Procedure: LEFT BELOW KNEE AMPUTATION;  Surgeon: [name], MD;  Location: [facility] OR;  Service: Vascular;  Laterality: Left;   AMPUTATION Right 11/02/2023   Procedure: RIGHT 2ND TOE AMPUTATION;  Surgeon: Nadara Mustard, MD;  Location: [facility];  Service: Orthopedics;  Laterality: Right;   AMPUTATION TOE Left    big toe and one next to it   CATARACT EXTRACTION Bilateral    EYE SURGERY Bilateral    cataracts removed   INSERTION OF ILIAC STENT Left    PERIPHERAL VASCULAR BALLOON ANGIOPLASTY Left 08/06/2020   Procedure: PERIPHERAL VASCULAR BALLOON ANGIOPLASTY;  Surgeon: [name], MD;  Location: [facility];  Service: Cardiovascular;  Laterality: Left;  Peroneal artery.   PERIPHERAL VASCULAR BALLOON ANGIOPLASTY  10/27/2023   Procedure: PERIPHERAL VASCULAR BALLOON ANGIOPLASTY;  Surgeon: [name], MD;  Location: [facility];  Service: Cardiovascular;;   TOE  AMPUTATION Left 2015   TOTAL HIP ARTHROPLASTY Left 05/23/2020   Procedure: LEFT TOTAL HIP ARTHROPLASTY ANTERIOR APPROACH;  Surgeon: Kathryne Hitch, MD;  Location: [facility];  Service: Orthopedics;  Laterality: Left;   TOTAL HIP ARTHROPLASTY Right 07/31/2021   Procedure: RIGHT TOTAL HIP ARTHROPLASTY ANTERIOR APPROACH;  Surgeon: Kathryne Hitch, MD;  Location: [facility];  Service: Orthopedics;  Laterality: Right;   WOUND DEBRIDEMENT Left 09/11/2020   Procedure: DEBRIDEMENT OF LEFT TRANSMETATARSAL WOUND;  Surgeon: Leonie Douglas, MD;  Location: [facility] OR;  Service: Vascular;  Laterality: Left;    Allergies  Allergen Reactions   Meloxicam Nausea Only    Upset stomach     Current Outpatient Medications  Medication Sig Dispense Refill   acetaminophen (TYLENOL) 500 MG tablet Take 1,000 mg by mouth every 6 (six) hours as needed for moderate pain.     ascorbic acid (VITAMIN C) 500 MG tablet Take 500 mg by mouth daily.     aspirin EC 81 MG tablet Take 81 mg by mouth daily. Swallow whole.     Berberine Chloride 500 MG CAPS Take 500 mg by mouth daily.     CITRUS BERGAMOT PO Take 1 capsule by mouth daily.     clopidogrel (PLAVIX) 75 MG tablet Take 75 mg by mouth daily.     dapagliflozin propanediol (FARXIGA) 5 MG TABS tablet Take 5 mg by mouth daily.     doxycycline (VIBRA-TABS) 100 MG tablet Take 1 tablet (100 mg total) by mouth 2 (two) times daily. 30 tablet 0   EPIPEN 2-PAK 0.3 MG/0.3ML SOAJ injection Inject 0.3 mg into the muscle as needed for anaphylaxis.     gabapentin (NEURONTIN) 300 MG capsule Take 600 mg by mouth 2 (two) times daily.     hydrochlorothiazide (HYDRODIURIL) 25 MG tablet Take 25 mg by mouth daily.     JARDIANCE 10 MG TABS tablet Take 10 mg by mouth daily.     lisinopril (ZESTRIL) 40 MG tablet Take 1 tablet (40 mg total) by mouth daily. 30 tablet 0   methocarbamol (ROBAXIN) 500 MG tablet Take 1 tablet (500 mg total) by mouth every 6 (six) hours as needed for muscle  spasms. 30 tablet 0   Multiple Vitamin (MULTIVITAMIN WITH MINERALS) TABS tablet Take 1 tablet by mouth 3 (three) times a week.     mupirocin ointment (BACTROBAN) 2 % Apply 1 Application topically 2 (two) times daily.     olmesartan (BENICAR) 40 MG tablet Take 40 mg by mouth daily.     oxyCODONE-acetaminophen (PERCOCET/ROXICET) 5-325 MG tablet Take 1 tablet by mouth every 4 (four) hours as needed. 30 tablet 0   Red Yeast Rice 600 MG CAPS Take 600 mg by mouth daily.     rosuvastatin (CRESTOR) 20 MG tablet Take 20 mg by mouth at bedtime.  tiZANidine (ZANAFLEX) 2 MG tablet Take 2 mg by mouth at bedtime as needed for muscle spasms.     No current facility-administered medications for this visit.    Family History  Problem Relation Age of Onset   COPD Mother    Cataracts Mother    Brain cancer Father    Hypertension Brother     Social History   Socioeconomic History   Marital status: Single    Spouse name: [name]   Number of children: 3   Years of education: 12   Highest education level: Not on file  Occupational History   Not on file  Tobacco Use   Smoking status: Former    Current packs/day: 0.00    Average packs/day: 0.5 packs/day for 41.0 years (20.5 ttl pk-yrs)    Types: Cigarettes    Start date: 07/21/1979    Quit date: 07/20/2020    Years since quitting: 3.3   Smokeless tobacco: Never  Vaping Use   Vaping status: Never Used  Substance and Sexual Activity   Alcohol use: Yes    Alcohol/week: 14.0 standard drinks of alcohol    Types: 14 Standard drinks or equivalent per week    Comment: daily   Drug use: Yes    Comment: marijuana   Sexual activity: Yes  Other Topics Concern   Not on file  Social History Narrative   Not on file   Social Drivers of Health   Financial Resource Strain: Low Risk  (11/22/2018)   Overall Financial Resource Strain (CARDIA)    Difficulty of Paying Living Expenses: Not hard at all  Food Insecurity: No Food Insecurity (11/22/2018)    Hunger Vital Sign    Worried About Running Out of Food in the Last Year: Never true    Ran Out of Food in the Last Year: Never true  Transportation Needs: No Transportation Needs (11/22/2018)   PRAPARE - Administrator, Civil Service (Medical): No    Lack of Transportation (Non-Medical): No  Physical Activity: Inactive (11/22/2018)   Exercise Vital Sign    Days of Exercise per Week: 0 days    Minutes of Exercise per Session: 0 min  Stress: No Stress Concern Present (11/22/2018)   [employer] of Occupational Health - Occupational Stress Questionnaire    Feeling of Stress : Not at all  Social Connections: Somewhat Isolated (11/22/2018)   Social Connection and Isolation Panel [NHANES]    Frequency of Communication with Friends and Family: Twice a week    Frequency of Social Gatherings with Friends and Family: Twice a week    Attends Religious Services: Never    Database administrator or Organizations: No    Attends Banker Meetings: Never    Marital Status: Married  Catering manager Violence: Not At Risk (11/22/2018)   Humiliation, Afraid, Rape, and Kick questionnaire    Fear of Current or Ex-Partner: No    Emotionally Abused: No    Physically Abused: No    Sexually Abused: No     REVIEW OF SYSTEMS:   [X]  denotes positive finding, [ ]  denotes negative finding Cardiac  Comments:  Chest pain or chest pressure:    Shortness of breath upon exertion:    Short of breath when lying flat:    Irregular heart rhythm:        Vascular    Pain in calf, thigh, or hip brought on by ambulation:    Pain in feet at night that wakes you  up from your sleep:     Blood clot in your veins:    Leg swelling:         Pulmonary    Oxygen at home:    Productive cough:     Wheezing:         Neurologic    Sudden weakness in arms or legs:     Sudden numbness in arms or legs:     Sudden onset of difficulty speaking or slurred speech:    Temporary loss of vision in one  eye:     Problems with dizziness:         Gastrointestinal    Blood in stool:     Vomited blood:         Genitourinary    Burning when urinating:     Blood in urine:        Psychiatric    Major depression:         Hematologic    Bleeding problems:    Problems with blood clotting too easily:        Skin    Rashes or ulcers:        Constitutional    Fever or chills:      PHYSICAL EXAMINATION:   General:  WDWN in NAD; vital signs documented above Gait: Not observed HENT: WNL, normocephalic Pulmonary: normal non-labored breathing , without wheezing Cardiac: regular HR, without carotid bruits Skin: without rashes Vascular Exam/Pulses:  Right Left  Radial 2+ (normal) 2+ (normal)  Femoral 2+ (normal) 2+ (normal)  DP monophasic BKA  PT Brisk multiphasic BKA  Peroneal absent BKA   Extremities: without ischemic changes, without Gangrene , without cellulitis;      Musculoskeletal: no muscle wasting or atrophy  Neurologic: A&O X 3 Psychiatric:  The pt has Normal affect.   Non-Invasive Vascular Imaging:   ABI's/TBI's on 11/23/2023: Right:  1.13/0.29 - Great toe pressure: 38 Left:  amp   Arterial duplex on 11/23/2023: Summary:  Right: No evidence of stenosis in the right LE arterial system.  CFA aneurysm measuring 2.5 x 2.55 cm.   Previous ABI's/TBI's on 10/14/2023: Right:  1.0/0.17 - Great toe pressure: 20 Left:  BKA   Previous arterial duplex on 10/14/2023: Right: 50-74% stenosis noted in the superficial femoral artery.   Previous AAA duplex 10/04/2023: 4.28cm AAA    ASSESSMENT/PLAN:: 63 y.o. male here for follow up for PAD with hx of  angiogram with right PTA angioplasty via left CFA by Dr. Chestine Spore.    -pt with brisk right multiphasic doppler flow and toe amputation is healing.   -given his hx of AAA, he will get BLE arterial duplex at next visit to also evaluate for popliteal artery aneurysm.  He does have a right CFA aneurysm measuring 2.5 x 2.5 cm.  Dr.  Chestine Spore discussed we will repair this around 3.0-3.5cm.   -continue asa/statin/plavix -pt will f/u in 9 months with AAA duplex and BLE arterial duplex and ABI -his prosthesis is working well. Doreatha Massed, [facility] [phone]  Clinic MD:   Chestine Spore

## 2023-11-29 ENCOUNTER — Ambulatory Visit (INDEPENDENT_AMBULATORY_CARE_PROVIDER_SITE_OTHER): Payer: Medicare HMO | Admitting: Physician Assistant

## 2023-11-29 ENCOUNTER — Encounter: Payer: Self-pay | Admitting: Physician Assistant

## 2023-11-29 DIAGNOSIS — I739 Peripheral vascular disease, unspecified: Secondary | ICD-10-CM | POA: Diagnosis not present

## 2023-12-01 ENCOUNTER — Ambulatory Visit (INDEPENDENT_AMBULATORY_CARE_PROVIDER_SITE_OTHER): Admitting: Orthopedic Surgery

## 2023-12-01 ENCOUNTER — Other Ambulatory Visit: Payer: Self-pay | Admitting: *Deleted

## 2023-12-01 DIAGNOSIS — E782 Mixed hyperlipidemia: Secondary | ICD-10-CM | POA: Diagnosis not present

## 2023-12-01 DIAGNOSIS — N1831 Chronic kidney disease, stage 3a: Secondary | ICD-10-CM | POA: Diagnosis not present

## 2023-12-01 DIAGNOSIS — L97519 Non-pressure chronic ulcer of other part of right foot with unspecified severity: Secondary | ICD-10-CM

## 2023-12-01 DIAGNOSIS — Z125 Encounter for screening for malignant neoplasm of prostate: Secondary | ICD-10-CM | POA: Diagnosis not present

## 2023-12-01 DIAGNOSIS — E1142 Type 2 diabetes mellitus with diabetic polyneuropathy: Secondary | ICD-10-CM | POA: Diagnosis not present

## 2023-12-01 DIAGNOSIS — E291 Testicular hypofunction: Secondary | ICD-10-CM | POA: Diagnosis not present

## 2023-12-01 DIAGNOSIS — I739 Peripheral vascular disease, unspecified: Secondary | ICD-10-CM

## 2023-12-01 DIAGNOSIS — I129 Hypertensive chronic kidney disease with stage 1 through stage 4 chronic kidney disease, or unspecified chronic kidney disease: Secondary | ICD-10-CM | POA: Diagnosis not present

## 2023-12-01 DIAGNOSIS — I7143 Infrarenal abdominal aortic aneurysm, without rupture: Secondary | ICD-10-CM

## 2023-12-04 ENCOUNTER — Encounter: Payer: Self-pay | Admitting: Orthopedic Surgery

## 2023-12-04 NOTE — Progress Notes (Signed)
 Office Visit Note   Patient: Anthony Park           Date of Birth: [birth year]           MRN: [id number] Visit Date: 12/01/2023              Requested by: Melida Quitter, MD [address] [place],  [place] [address] PCP: Melida Quitter, MD  Chief Complaint  Patient presents with   Right Foot - Routine Post Op    11/02/2023 right 2nd toe amputation       HPI: Patient is a 63 year old gentleman who is 4 weeks status post right second toe amputation.  Assessment & Plan: Visit Diagnoses:  1. Ulcer of toe of right foot, unspecified ulcer stage (HCC)     Plan: Patient will proceed with compression and a prescription was provided for doxycycline if he develops a infection in the future.  Follow-Up Instructions: No follow-ups on file.   Ortho Exam  Patient is alert, oriented, no adenopathy, well-dressed, normal affect, normal respiratory effort. Examination of the incision is well-healed there is no redness no cellulitis the redness in the forefoot has resolved.  Imaging: No results found. No images are attached to the encounter.  Labs: Lab Results  Component Value Date   HGBA1C 6.5 (H) 07/24/2021   HGBA1C 6.2 (H) 10/03/2020   HGBA1C 6.8 (H) 05/20/2020   CRP 3.8 (H) 10/03/2020   CRP 53.7 (H) 09/23/2020   LABURIC 6.9 07/29/2020   REPTSTATUS 09/16/2020 FINAL 09/11/2020   GRAMSTAIN  09/11/2020    RARE WBC PRESENT, PREDOMINANTLY MONONUCLEAR MODERATE GRAM NEGATIVE RODS FEW GRAM VARIABLE ROD    CULT  09/11/2020    MODERATE PSEUDOMONAS AERUGINOSA RARE ESCHERICHIA COLI NO ANAEROBES ISOLATED Performed at [facility], [address] [place], [place] [address]    LABORGA PSEUDOMONAS AERUGINOSA 09/11/2020   LABORGA ESCHERICHIA COLI 09/11/2020     Lab Results  Component Value Date   ALBUMIN 3.7 11/02/2023   ALBUMIN 3.4 (L) 10/03/2020   ALBUMIN 3.6 09/11/2020    Lab Results  Component Value Date   MG 1.9 08/24/2020   MG 1.6 (L) 08/23/2020   No results found  for: "VD25OH"  No results found for: "PREALBUMIN"    Latest Ref Rng & Units 11/02/2023    6:42 AM 10/27/2023    9:00 AM 07/24/2021    8:38 AM  CBC EXTENDED  WBC 4.0 - 10.5 K/uL 8.6   8.4   RBC 4.22 - 5.81 MIL/uL 4.58   4.89   Hemoglobin 13.0 - 17.0 g/dL 65.7  84.6  96.2   HCT 39.0 - 52.0 % 40.0  40.0  40.4   Platelets 150 - 400 K/uL 255   240      There is no height or weight on file to calculate BMI.  Orders:  No orders of the defined types were placed in this encounter.  No orders of the defined types were placed in this encounter.    Procedures: No procedures performed  Clinical Data: No additional findings.  ROS:  All other systems negative, except as noted in the HPI. Review of Systems  Objective: Vital Signs: There were no vitals taken for this visit.  Specialty Comments:  No specialty comments available.  PMFS History: Patient Active Problem List   Diagnosis Date Noted   Osteomyelitis of second toe of right foot (HCC) 11/02/2023   Hypogonadism, male 01/19/2023   Erectile dysfunction 01/19/2023   Aneurysm of  abdominal vessel (HCC) 01/19/2023   Chronic kidney disease, stage 3a (HCC) 01/19/2023   AAA (abdominal aortic aneurysm) without rupture (HCC) 01/18/2023   S/P BKA (below knee amputation) unilateral, left (HCC) 10/03/2020   Non-healing wound of lower extremity 10/03/2020   History of amputation of left foot through metatarsal bone (HCC) 09/11/2020   PAD (peripheral artery disease) (HCC) 08/20/2020   Status post total replacement of left hip 06/05/2020   Unilateral primary osteoarthritis, right hip 04/17/2020   Unilateral primary osteoarthritis, left hip 04/17/2020   Amputated toe, right (HCC) 11/22/2018   Type 2 diabetes mellitus with foot ulcer, without long-term current use of insulin (HCC) 11/22/2018   Mixed hyperlipidemia 11/22/2018   Aneurysm of left popliteal artery (HCC) 11/22/2018   Status post peripheral artery angioplasty with insertion  of stent 11/22/2018   Osteomyelitis of third toe of right foot (HCC)    Cellulitis of third toe of right foot    Past Medical History:  Diagnosis Date   AAA (abdominal aortic aneurysm) (HCC)    Arthritis    Diabetes mellitus without complication (HCC)    type 2 controlled with diet    Diabetic toe ulcer (HCC) 11/05/2018   History of kidney stones    passed 1 several years ago   Hyperlipidemia    Hypertension    not on medications   Peripheral arterial disease (HCC)    Peripheral vascular disease (HCC)    stent in left leg due to anerysm     Family History  Problem Relation Age of Onset   COPD Mother    Cataracts Mother    Brain cancer Father    Hypertension Brother     Past Surgical History:  Procedure Laterality Date    Left Transmetatarsal Ampuation (Left Foot)  08/20/2020   ABDOMINAL AORTOGRAM W/LOWER EXTREMITY N/A 08/06/2020   Procedure: ABDOMINAL AORTOGRAM W/LOWER EXTREMITY;  Surgeon: [name], MD;  Location: [facility];  Service: Cardiovascular;  Laterality: N/A;   ABDOMINAL AORTOGRAM W/LOWER EXTREMITY N/A 10/27/2023   Procedure: ABDOMINAL AORTOGRAM W/LOWER EXTREMITY;  Surgeon: [name], MD;  Location: [facility];  Service: Cardiovascular;  Laterality: N/A;   AMPUTATION Right 11/07/2018   Procedure: RIGHT 3RD TOE AMPUTATION;  Surgeon: Nadara Mustard, MD;  Location: [facility] OR;  Service: Orthopedics;  Laterality: Right;   AMPUTATION Left 08/20/2020   Procedure: Left Transmetatarsal Ampuation;  Surgeon: [name], MD;  Location: [facility] OR;  Service: Vascular;  Laterality: Left;   AMPUTATION Left 10/03/2020   Procedure: LEFT BELOW KNEE AMPUTATION;  Surgeon: [name], MD;  Location: [facility] OR;  Service: Vascular;  Laterality: Left;   AMPUTATION Right 11/02/2023   Procedure: RIGHT 2ND TOE AMPUTATION;  Surgeon: Nadara Mustard, MD;  Location: [facility] OR;  Service: Orthopedics;  Laterality: Right;   AMPUTATION TOE Left    big toe and  one next to it   CATARACT EXTRACTION Bilateral    EYE SURGERY Bilateral    cataracts removed   INSERTION OF ILIAC STENT Left    PERIPHERAL VASCULAR BALLOON ANGIOPLASTY Left 08/06/2020   Procedure: PERIPHERAL VASCULAR BALLOON ANGIOPLASTY;  Surgeon: [name], MD;  Location: [facility];  Service: Cardiovascular;  Laterality: Left;  Peroneal artery.   PERIPHERAL VASCULAR BALLOON ANGIOPLASTY  10/27/2023   Procedure: PERIPHERAL VASCULAR BALLOON ANGIOPLASTY;  Surgeon: [name], MD;  Location: [facility];  Service: Cardiovascular;;   TOE AMPUTATION Left 2015   TOTAL HIP  ARTHROPLASTY Left 05/23/2020   Procedure: LEFT TOTAL HIP ARTHROPLASTY ANTERIOR APPROACH;  Surgeon: Kathryne Hitch, MD;  Location: [facility];  Service: Orthopedics;  Laterality: Left;   TOTAL HIP ARTHROPLASTY Right 07/31/2021   Procedure: RIGHT TOTAL HIP ARTHROPLASTY ANTERIOR APPROACH;  Surgeon: Kathryne Hitch, MD;  Location: [facility];  Service: Orthopedics;  Laterality: Right;   WOUND DEBRIDEMENT Left 09/11/2020   Procedure: DEBRIDEMENT OF LEFT TRANSMETATARSAL WOUND;  Surgeon: Leonie Douglas, MD;  Location: [facility] OR;  Service: Vascular;  Laterality: Left;   Social History   Occupational History   Not on file  Tobacco Use   Smoking status: Former    Current packs/day: 0.00    Average packs/day: 0.5 packs/day for 41.0 years (20.5 ttl pk-yrs)    Types: Cigarettes    Start date: 07/21/1979    Quit date: 07/20/2020    Years since quitting: 3.3   Smokeless tobacco: Never  Vaping Use   Vaping status: Never Used  Substance and Sexual Activity   Alcohol use: Yes    Alcohol/week: 14.0 standard drinks of alcohol    Types: 14 Standard drinks or equivalent per week    Comment: daily   Drug use: Yes    Comment: marijuana   Sexual activity: Yes

## 2023-12-08 DIAGNOSIS — Z1331 Encounter for screening for depression: Secondary | ICD-10-CM | POA: Diagnosis not present

## 2023-12-08 DIAGNOSIS — I129 Hypertensive chronic kidney disease with stage 1 through stage 4 chronic kidney disease, or unspecified chronic kidney disease: Secondary | ICD-10-CM | POA: Diagnosis not present

## 2023-12-08 DIAGNOSIS — I10 Essential (primary) hypertension: Secondary | ICD-10-CM | POA: Diagnosis not present

## 2023-12-08 DIAGNOSIS — E1151 Type 2 diabetes mellitus with diabetic peripheral angiopathy without gangrene: Secondary | ICD-10-CM | POA: Diagnosis not present

## 2023-12-08 DIAGNOSIS — R82998 Other abnormal findings in urine: Secondary | ICD-10-CM | POA: Diagnosis not present

## 2023-12-08 DIAGNOSIS — Z1339 Encounter for screening examination for other mental health and behavioral disorders: Secondary | ICD-10-CM | POA: Diagnosis not present

## 2023-12-08 DIAGNOSIS — E669 Obesity, unspecified: Secondary | ICD-10-CM | POA: Diagnosis not present

## 2023-12-08 DIAGNOSIS — I739 Peripheral vascular disease, unspecified: Secondary | ICD-10-CM | POA: Diagnosis not present

## 2023-12-08 DIAGNOSIS — Z89512 Acquired absence of left leg below knee: Secondary | ICD-10-CM | POA: Diagnosis not present

## 2023-12-08 DIAGNOSIS — Z0000 Encounter for general adult medical examination without abnormal findings: Secondary | ICD-10-CM | POA: Diagnosis not present

## 2023-12-08 DIAGNOSIS — E1142 Type 2 diabetes mellitus with diabetic polyneuropathy: Secondary | ICD-10-CM | POA: Diagnosis not present

## 2023-12-08 DIAGNOSIS — N182 Chronic kidney disease, stage 2 (mild): Secondary | ICD-10-CM | POA: Diagnosis not present

## 2023-12-08 DIAGNOSIS — I714 Abdominal aortic aneurysm, without rupture, unspecified: Secondary | ICD-10-CM | POA: Diagnosis not present

## 2023-12-08 DIAGNOSIS — E782 Mixed hyperlipidemia: Secondary | ICD-10-CM | POA: Diagnosis not present

## 2023-12-08 DIAGNOSIS — J439 Emphysema, unspecified: Secondary | ICD-10-CM | POA: Diagnosis not present

## 2023-12-08 DIAGNOSIS — Z1389 Encounter for screening for other disorder: Secondary | ICD-10-CM | POA: Diagnosis not present

## 2023-12-12 DIAGNOSIS — Z89512 Acquired absence of left leg below knee: Secondary | ICD-10-CM | POA: Diagnosis not present

## 2023-12-15 ENCOUNTER — Telehealth: Payer: Self-pay | Admitting: Orthopedic Surgery

## 2023-12-15 NOTE — Telephone Encounter (Signed)
 Pt submitted medical release form, disability forms, and credit card/debit card payment $20.00. Accepted 12/15/23

## 2023-12-20 NOTE — Telephone Encounter (Signed)
 Received

## 2024-01-17 ENCOUNTER — Telehealth: Payer: Self-pay | Admitting: Orthopedic Surgery

## 2024-01-17 ENCOUNTER — Other Ambulatory Visit: Payer: Self-pay | Admitting: Orthopedic Surgery

## 2024-01-17 NOTE — Telephone Encounter (Signed)
 SW pt, he is scheduled tomorrow afternoon with Dr. Julio Ohm.

## 2024-01-17 NOTE — Telephone Encounter (Signed)
 Patient called and LM on the PT line. He dropped something on his foot and his toe looks real bad. Would like to be seen ASAP. His cb# is [phone]

## 2024-01-18 ENCOUNTER — Other Ambulatory Visit (INDEPENDENT_AMBULATORY_CARE_PROVIDER_SITE_OTHER): Payer: Self-pay

## 2024-01-18 ENCOUNTER — Ambulatory Visit (INDEPENDENT_AMBULATORY_CARE_PROVIDER_SITE_OTHER): Admitting: Orthopedic Surgery

## 2024-01-18 ENCOUNTER — Encounter: Payer: Self-pay | Admitting: Orthopedic Surgery

## 2024-01-18 DIAGNOSIS — L97519 Non-pressure chronic ulcer of other part of right foot with unspecified severity: Secondary | ICD-10-CM | POA: Diagnosis not present

## 2024-01-18 DIAGNOSIS — M869 Osteomyelitis, unspecified: Secondary | ICD-10-CM

## 2024-01-18 NOTE — Progress Notes (Signed)
 Office Visit Note   Patient: Anthony Park           Date of Birth: 04-Sep-1961           MRN: [id number] Visit Date: 01/18/2024              Requested by: Azalia Leo, MD [address] [place],  [place] [address] PCP: Azalia Leo, MD  Chief Complaint  Patient presents with   Right Foot - Routine Post Op    11/02/2023 right 2nd toe amputation       HPI: Patient is a 63 year old gentleman who is status post a right second toe amputation in February 3 months ago who presents with acute pain redness and swelling of the right great toe.  Patient states he dropped an item on his foot and the toe looked bad.  Patient states he started doxycycline  this morning.  Patient states that he does have bad neuropathy.  Assessment & Plan: Visit Diagnoses:  1. Ulcer of toe of right foot, unspecified ulcer stage (HCC)   2. Osteomyelitis of great toe of right foot (HCC)     Plan: The destruction of the tuft of the toe is a separate nail infection.  This was not associated with the infection of the second toe.  Will plan for amputation of the right great toe through the MTP joint.  Risk and benefits were discussed including risk of the wound not healing need for additional surgery.  Patient states he understands wished to proceed at this time.  Follow-Up Instructions: No follow-ups on file.   Ortho Exam  Patient is alert, oriented, no adenopathy, well-dressed, normal affect, normal respiratory effort. Examination patient has a strong biphasic posterior tibial pulse by Doppler and a dampened biphasic dorsalis pedis pulse by Doppler.  Patient has cellulitis of the great toe he reports purulent drainage from around the nail there is no drainage today.  There is an ulcer over the tip of the toe that probes to bone.  Imaging: No results found. No images are attached to the encounter.  Labs: Lab Results  Component Value Date   HGBA1C 6.5 (H) 07/24/2021   HGBA1C 6.2 (H) 10/03/2020   HGBA1C 6.8  (H) 05/20/2020   CRP 3.8 (H) 10/03/2020   CRP 53.7 (H) 09/23/2020   LABURIC 6.9 07/29/2020   REPTSTATUS 09/16/2020 FINAL 09/11/2020   GRAMSTAIN  09/11/2020    RARE WBC PRESENT, PREDOMINANTLY MONONUCLEAR MODERATE GRAM NEGATIVE RODS FEW GRAM VARIABLE ROD    CULT  09/11/2020    MODERATE PSEUDOMONAS AERUGINOSA RARE ESCHERICHIA COLI NO ANAEROBES ISOLATED Performed at [facility], [address] [place], [place] [address]    LABORGA PSEUDOMONAS AERUGINOSA 09/11/2020   LABORGA ESCHERICHIA COLI 09/11/2020     Lab Results  Component Value Date   ALBUMIN  3.7 11/02/2023   ALBUMIN  3.4 (L) 10/03/2020   ALBUMIN  3.6 09/11/2020    Lab Results  Component Value Date   MG 1.9 08/24/2020   MG 1.6 (L) 08/23/2020   No results found for: "VD25OH"  No results found for: "PREALBUMIN"    Latest Ref Rng & Units 11/02/2023    6:42 AM 10/27/2023    9:00 AM 07/24/2021    8:38 AM  CBC EXTENDED  WBC 4.0 - 10.5 K/uL 8.6   8.4   RBC 4.22 - 5.81 MIL/uL 4.58   4.89   Hemoglobin 13.0 - 17.0 g/dL 36.6  44.0  34.7   HCT 39.0 - 52.0 %  40.0  40.0  40.4   Platelets 150 - 400 K/uL 255   240      There is no height or weight on file to calculate BMI.  Orders:  Orders Placed This Encounter  Procedures   XR Foot Complete Right   No orders of the defined types were placed in this encounter.    Procedures: No procedures performed  Clinical Data: No additional findings.  ROS:  All other systems negative, except as noted in the HPI. Review of Systems  Objective: Vital Signs: There were no vitals taken for this visit.  Specialty Comments:  No specialty comments available.  PMFS History: Patient Active Problem List   Diagnosis Date Noted   Osteomyelitis of second toe of right foot (HCC) 11/02/2023   Hypogonadism, male 01/19/2023   Erectile dysfunction 01/19/2023   Aneurysm of abdominal vessel (HCC) 01/19/2023   Chronic kidney disease, stage 3a (HCC) 01/19/2023   AAA (abdominal  aortic aneurysm) without rupture (HCC) 01/18/2023   S/P BKA (below knee amputation) unilateral, left (HCC) 10/03/2020   Non-healing wound of lower extremity 10/03/2020   History of amputation of left foot through metatarsal bone (HCC) 09/11/2020   PAD (peripheral artery disease) (HCC) 08/20/2020   Status post total replacement of left hip 06/05/2020   Unilateral primary osteoarthritis, right hip 04/17/2020   Unilateral primary osteoarthritis, left hip 04/17/2020   Amputated toe, right (HCC) 11/22/2018   Type 2 diabetes mellitus with foot ulcer, without long-term current use of insulin  (HCC) 11/22/2018   Mixed hyperlipidemia 11/22/2018   Aneurysm of left popliteal artery (HCC) 11/22/2018   Status post peripheral artery angioplasty with insertion of stent 11/22/2018   Osteomyelitis of third toe of right foot (HCC)    Cellulitis of third toe of right foot    Past Medical History:  Diagnosis Date   AAA (abdominal aortic aneurysm) (HCC)    Arthritis    Diabetes mellitus without complication (HCC)    type 2 controlled with diet    Diabetic toe ulcer (HCC) 11/05/2018   History of kidney stones    passed 1 several years ago   Hyperlipidemia    Hypertension    not on medications   Peripheral arterial disease (HCC)    Peripheral vascular disease (HCC)    stent in left leg due to anerysm     Family History  Problem Relation Age of Onset   COPD Mother    Cataracts Mother    Brain cancer Father    Hypertension Brother     Past Surgical History:  Procedure Laterality Date    Left Transmetatarsal Ampuation (Left Foot)  08/20/2020   ABDOMINAL AORTOGRAM W/LOWER EXTREMITY N/A 08/06/2020   Procedure: ABDOMINAL AORTOGRAM W/LOWER EXTREMITY;  Surgeon: Young Hensen, MD;  Location: [facility];  Service: Cardiovascular;  Laterality: N/A;   ABDOMINAL AORTOGRAM W/LOWER EXTREMITY N/A 10/27/2023   Procedure: ABDOMINAL AORTOGRAM W/LOWER EXTREMITY;  Surgeon: Young Hensen, MD;   Location: [facility];  Service: Cardiovascular;  Laterality: N/A;   AMPUTATION Right 11/07/2018   Procedure: RIGHT 3RD TOE AMPUTATION;  Surgeon: Timothy Ford, MD;  Location: [facility] OR;  Service: Orthopedics;  Laterality: Right;   AMPUTATION Left 08/20/2020   Procedure: Left Transmetatarsal Ampuation;  Surgeon: Young Hensen, MD;  Location: [facility] OR;  Service: Vascular;  Laterality: Left;   AMPUTATION Left 10/03/2020   Procedure: LEFT BELOW KNEE AMPUTATION;  Surgeon: Young Hensen, MD;  Location: [facility] OR;  Service: Vascular;  Laterality: Left;   AMPUTATION Right 11/02/2023   Procedure: RIGHT 2ND TOE AMPUTATION;  Surgeon: Timothy Ford, MD;  Location: [facility] OR;  Service: Orthopedics;  Laterality: Right;   AMPUTATION TOE Left    big toe and one next to it   CATARACT EXTRACTION Bilateral    EYE SURGERY Bilateral    cataracts removed   INSERTION OF ILIAC STENT Left    PERIPHERAL VASCULAR BALLOON ANGIOPLASTY Left 08/06/2020   Procedure: PERIPHERAL VASCULAR BALLOON ANGIOPLASTY;  Surgeon: Young Hensen, MD;  Location: [facility];  Service: Cardiovascular;  Laterality: Left;  Peroneal artery.   PERIPHERAL VASCULAR BALLOON ANGIOPLASTY  10/27/2023   Procedure: PERIPHERAL VASCULAR BALLOON ANGIOPLASTY;  Surgeon: Young Hensen, MD;  Location: [facility];  Service: Cardiovascular;;   TOE AMPUTATION Left 2015   TOTAL HIP ARTHROPLASTY Left 05/23/2020   Procedure: LEFT TOTAL HIP ARTHROPLASTY ANTERIOR APPROACH;  Surgeon: Arnie Lao, MD;  Location: [facility];  Service: Orthopedics;  Laterality: Left;   TOTAL HIP ARTHROPLASTY Right 07/31/2021   Procedure: RIGHT TOTAL HIP ARTHROPLASTY ANTERIOR APPROACH;  Surgeon: Arnie Lao, MD;  Location: [facility];  Service: Orthopedics;  Laterality: Right;   WOUND DEBRIDEMENT Left 09/11/2020   Procedure: DEBRIDEMENT OF LEFT TRANSMETATARSAL WOUND;  Surgeon: Carlene Che, MD;  Location: [facility] OR;  Service: Vascular;   Laterality: Left;   Social History   Occupational History   Not on file  Tobacco Use   Smoking status: Former    Current packs/day: 0.00    Average packs/day: 0.5 packs/day for 41.0 years (20.5 ttl pk-yrs)    Types: Cigarettes    Start date: 07/21/1979    Quit date: 07/20/2020    Years since quitting: 3.4   Smokeless tobacco: Never  Vaping Use   Vaping status: Never Used  Substance and Sexual Activity   Alcohol  use: Yes    Alcohol /week: 14.0 standard drinks of alcohol     Types: 14 Standard drinks or equivalent per week    Comment: daily   Drug use: Yes    Comment: marijuana   Sexual activity: Yes

## 2024-01-19 ENCOUNTER — Encounter (HOSPITAL_COMMUNITY): Payer: Self-pay | Admitting: Orthopedic Surgery

## 2024-01-19 ENCOUNTER — Other Ambulatory Visit: Payer: Self-pay

## 2024-01-19 NOTE — Progress Notes (Signed)
 SDW CALL  Patient was given pre-op instructions over the phone. The opportunity was given for the patient to ask questions. No further questions asked. Patient verbalized understanding of instructions given.   PCP - Marlo Simpler Cardiologist - denies  PPM/ICD - denies   Chest x-ray - denies EKG - 10/27/23 Stress Test - denies ECHO - denies Cardiac Cath - denies  Sleep Study -denies    Fasting Blood Sugar - 140 Checks Blood Sugar __1___ times a day  Patient states his last dose of Farxiga was last week and that he is controlling his diabetes with diet.  He states that his fasting glucose is 140.  Patient aware not to take Farxiga today or day of surgery.    Per Dr. Julio Ohm, patient can continue both Aspirin  and Plavix .    ERAS Protcol - clears until 0845 PRE-SURGERY Ensure or G2- n/a  COVID TEST-  n/a   Anesthesia review: yes - sent to anesthesia  - patient reports no changes since he was here for his procedure in February.   Patient denies shortness of breath, fever, cough and chest pain over the phone call   All instructions explained to the patient, with a verbal understanding of the material. Patient agrees to go over the instructions while at home for a better understanding.

## 2024-01-19 NOTE — Anesthesia Preprocedure Evaluation (Addendum)
 Anesthesia Evaluation  Patient identified by MRN, date of birth, ID band Patient awake    Reviewed: Allergy & Precautions, NPO status , Patient's Chart, lab work & pertinent test results  History of Anesthesia Complications Negative for: history of anesthetic complications  Airway Mallampati: III  TM Distance: >3 FB Neck ROM: Full    Dental  (+) Dental Advisory Given, Missing   Pulmonary neg shortness of breath, neg sleep apnea, neg COPD, neg recent URI, former smoker   Pulmonary exam normal breath sounds clear to auscultation       Cardiovascular hypertension (HCTZ, lisinopril , olmesartan), Pt. on medications (-) angina + Peripheral Vascular Disease (s/p angioplasty with stent)  (-) Past MI, (-) Cardiac Stents and (-) CABG + dysrhythmias (RBBB)  Rhythm:Regular Rate:Normal  HLD, AAA 4.3 cm 10/04/2023 (unchanged from previously)   Neuro/Psych neg Seizures  Neuromuscular disease (neuropathy)    GI/Hepatic negative GI ROS, Neg liver ROS,,,  Endo/Other  diabetes, Type 2    Renal/GU CRFRenal disease     Musculoskeletal  (+) Arthritis , Osteoarthritis,  Osteomyelitis    Abdominal  (+) + obese  Peds  Hematology  (+) Blood dyscrasia, anemia Lab Results      Component                Value               Date                      WBC                      8.6                 11/02/2023                HGB                      12.9 (L)            11/02/2023                HCT                      40.0                11/02/2023                MCV                      87.3                11/02/2023                PLT                      255                 11/02/2023              Anesthesia Other Findings Last Plavix : 01/19/2024  Last Farxiga: patient states no longer taking  Last Jardiance: patient states no longer taking  S/p left BKA  Reproductive/Obstetrics                              Anesthesia  Physical Anesthesia Plan  ASA: 3  Anesthesia Plan: MAC   Post-op  Pain Management: Tylenol  PO (pre-op)*   Induction: Intravenous  PONV Risk Score and Plan: 1 and Ondansetron , Dexamethasone , Treatment may vary due to age or medical condition, Propofol  infusion and TIVA  Airway Management Planned: Natural Airway and Simple Face Mask  Additional Equipment:   Intra-op Plan:   Post-operative Plan:   Informed Consent: I have reviewed the patients History and Physical, chart, labs and discussed the procedure including the risks, benefits and alternatives for the proposed anesthesia with the patient or authorized representative who has indicated his/her understanding and acceptance.     Dental advisory given  Plan Discussed with: Anesthesiologist and CRNA  Anesthesia Plan Comments: (Discussed with patient risks of MAC including, but not limited to, minor pain or discomfort, hearing people in the room, and possible need for backup general anesthesia. Risks for general anesthesia also discussed including, but not limited to, sore throat, hoarse voice, chipped/damaged teeth, injury to vocal cords, nausea and vomiting, allergic reactions, lung infection, heart attack, stroke, and death. All questions answered. )        Anesthesia Quick Evaluation

## 2024-01-20 ENCOUNTER — Other Ambulatory Visit: Payer: Self-pay

## 2024-01-20 ENCOUNTER — Ambulatory Visit (HOSPITAL_COMMUNITY)
Admission: RE | Admit: 2024-01-20 | Discharge: 2024-01-20 | Disposition: A | Discharge status: Home / Self Care | Source: Ambulatory Visit | Attending: Orthopedic Surgery | Admitting: Orthopedic Surgery

## 2024-01-20 ENCOUNTER — Ambulatory Visit (HOSPITAL_COMMUNITY): Payer: Self-pay | Admitting: Anesthesiology

## 2024-01-20 ENCOUNTER — Encounter (HOSPITAL_COMMUNITY): Payer: Self-pay | Admitting: Orthopedic Surgery

## 2024-01-20 ENCOUNTER — Encounter (HOSPITAL_COMMUNITY)
Admission: RE | Disposition: A | Discharge status: Home / Self Care | Payer: Self-pay | Source: Ambulatory Visit | Attending: Orthopedic Surgery

## 2024-01-20 DIAGNOSIS — Z79899 Other long term (current) drug therapy: Secondary | ICD-10-CM | POA: Diagnosis not present

## 2024-01-20 DIAGNOSIS — E1151 Type 2 diabetes mellitus with diabetic peripheral angiopathy without gangrene: Secondary | ICD-10-CM | POA: Diagnosis not present

## 2024-01-20 DIAGNOSIS — M869 Osteomyelitis, unspecified: Secondary | ICD-10-CM | POA: Diagnosis not present

## 2024-01-20 DIAGNOSIS — Z7984 Long term (current) use of oral hypoglycemic drugs: Secondary | ICD-10-CM | POA: Insufficient documentation

## 2024-01-20 DIAGNOSIS — W208XXA Other cause of strike by thrown, projected or falling object, initial encounter: Secondary | ICD-10-CM | POA: Diagnosis not present

## 2024-01-20 DIAGNOSIS — Z87891 Personal history of nicotine dependence: Secondary | ICD-10-CM | POA: Insufficient documentation

## 2024-01-20 DIAGNOSIS — E785 Hyperlipidemia, unspecified: Secondary | ICD-10-CM | POA: Insufficient documentation

## 2024-01-20 DIAGNOSIS — I10 Essential (primary) hypertension: Secondary | ICD-10-CM | POA: Diagnosis not present

## 2024-01-20 DIAGNOSIS — Z89421 Acquired absence of other right toe(s): Secondary | ICD-10-CM | POA: Diagnosis not present

## 2024-01-20 DIAGNOSIS — N189 Chronic kidney disease, unspecified: Secondary | ICD-10-CM

## 2024-01-20 DIAGNOSIS — E1140 Type 2 diabetes mellitus with diabetic neuropathy, unspecified: Secondary | ICD-10-CM | POA: Insufficient documentation

## 2024-01-20 DIAGNOSIS — M86171 Other acute osteomyelitis, right ankle and foot: Secondary | ICD-10-CM | POA: Diagnosis not present

## 2024-01-20 DIAGNOSIS — I129 Hypertensive chronic kidney disease with stage 1 through stage 4 chronic kidney disease, or unspecified chronic kidney disease: Secondary | ICD-10-CM | POA: Diagnosis not present

## 2024-01-20 DIAGNOSIS — E1169 Type 2 diabetes mellitus with other specified complication: Secondary | ICD-10-CM | POA: Diagnosis not present

## 2024-01-20 HISTORY — PX: AMPUTATION TOE: SHX6595

## 2024-01-20 LAB — COMPREHENSIVE METABOLIC PANEL WITH GFR
ALT: 18 U/L (ref 0–44)
AST: 22 U/L (ref 15–41)
Albumin: 3.5 g/dL (ref 3.5–5.0)
Alkaline Phosphatase: 45 U/L (ref 38–126)
Anion gap: 12 (ref 5–15)
BUN: 35 mg/dL — ABNORMAL HIGH (ref 8–23)
CO2: 21 mmol/L — ABNORMAL LOW (ref 22–32)
Calcium: 9.1 mg/dL (ref 8.9–10.3)
Chloride: 104 mmol/L (ref 98–111)
Creatinine, Ser: 1.39 mg/dL — ABNORMAL HIGH (ref 0.61–1.24)
GFR, Estimated: 57 mL/min — ABNORMAL LOW (ref >60)
Glucose, Bld: 120 mg/dL — ABNORMAL HIGH (ref 70–99)
Potassium: 3.9 mmol/L (ref 3.5–5.1)
Sodium: 137 mmol/L (ref 135–145)
Total Bilirubin: 0.7 mg/dL (ref 0.0–1.2)
Total Protein: 7.1 g/dL (ref 6.5–8.1)

## 2024-01-20 LAB — GLUCOSE, CAPILLARY
Glucose-Capillary: 126 mg/dL — ABNORMAL HIGH (ref 70–99)
Glucose-Capillary: 119 mg/dL — ABNORMAL HIGH (ref 70–99)
Glucose-Capillary: 132 mg/dL — ABNORMAL HIGH (ref 70–99)

## 2024-01-20 LAB — CBC
HCT: 39.4 % (ref 39.0–52.0)
Hemoglobin: 12.9 g/dL — ABNORMAL LOW (ref 13.0–17.0)
MCH: 27.9 pg (ref 26.0–34.0)
MCHC: 32.7 g/dL (ref 30.0–36.0)
MCV: 85.3 fL (ref 80.0–100.0)
Platelets: 202 10*3/uL (ref 150–400)
RBC: 4.62 MIL/uL (ref 4.22–5.81)
RDW: 13.2 % (ref 11.5–15.5)
WBC: 8 10*3/uL (ref 4.0–10.5)
nRBC: 0 % (ref 0.0–0.2)

## 2024-01-20 SURGERY — AMPUTATION, TOE
Anesthesia: Monitor Anesthesia Care | Site: Toe | Laterality: Right

## 2024-01-20 MED ORDER — PROPOFOL 10 MG/ML IV BOLUS
INTRAVENOUS | Status: DC | PRN
  Administered 2024-01-20: 50 mg via INTRAVENOUS

## 2024-01-20 MED ORDER — FENTANYL CITRATE (PF) 250 MCG/5ML IJ SOLN
INTRAMUSCULAR | Status: DC | PRN
Start: 2024-01-20 — End: 2024-01-20
  Administered 2024-01-20: 100 ug via INTRAVENOUS

## 2024-01-20 MED ORDER — LIDOCAINE HCL (PF) 1 % IJ SOLN
INTRAMUSCULAR | Status: DC | PRN
  Administered 2024-01-20: 20 mL

## 2024-01-20 MED ORDER — OXYCODONE-ACETAMINOPHEN 5-325 MG PO TABS: 1.0000 | ORAL_TABLET | ORAL | 0 refills | Status: DC | PRN

## 2024-01-20 MED ORDER — LACTATED RINGERS IV SOLN: INTRAVENOUS | Status: DC

## 2024-01-20 MED ORDER — LIDOCAINE HCL 1 % IJ SOLN
INTRAMUSCULAR | Status: AC
  Filled 2024-01-20: qty 20

## 2024-01-20 MED ORDER — ONDANSETRON HCL 4 MG/2ML IJ SOLN
INTRAMUSCULAR | Status: DC | PRN
  Administered 2024-01-20: 4 mg via INTRAVENOUS

## 2024-01-20 MED ORDER — PROPOFOL 10 MG/ML IV BOLUS
INTRAVENOUS | Status: AC
  Filled 2024-01-20: qty 20

## 2024-01-20 MED ORDER — DEXAMETHASONE SODIUM PHOSPHATE 10 MG/ML IJ SOLN
INTRAMUSCULAR | Status: DC | PRN
  Administered 2024-01-20: 5 mg via INTRAVENOUS

## 2024-01-20 MED ORDER — ACETAMINOPHEN 500 MG PO TABS
1000.0000 mg | ORAL_TABLET | Freq: Once | ORAL | Status: AC
  Administered 2024-01-20: 1000 mg via ORAL
  Filled 2024-01-20: qty 2

## 2024-01-20 MED ORDER — INSULIN ASPART 100 UNIT/ML IJ SOLN: 0.0000 [IU] | INTRAMUSCULAR | Status: DC | PRN

## 2024-01-20 MED ORDER — CEFAZOLIN SODIUM-DEXTROSE 2-4 GM/100ML-% IV SOLN
2.0000 g | INTRAVENOUS | Status: AC
  Administered 2024-01-20: 2 g via INTRAVENOUS
  Filled 2024-01-20: qty 100

## 2024-01-20 MED ORDER — FENTANYL CITRATE (PF) 250 MCG/5ML IJ SOLN
INTRAMUSCULAR | Status: AC
  Filled 2024-01-20: qty 5

## 2024-01-20 MED ORDER — MIDAZOLAM HCL 2 MG/2ML IJ SOLN
INTRAMUSCULAR | Status: AC
Start: 2024-01-20 — End: ?
  Filled 2024-01-20: qty 2

## 2024-01-20 MED ORDER — ORAL CARE MOUTH RINSE: 15.0000 mL | Freq: Once | OROMUCOSAL | Status: AC

## 2024-01-20 MED ORDER — LIDOCAINE 2% (20 MG/ML) 5 ML SYRINGE
INTRAMUSCULAR | Status: AC
  Filled 2024-01-20: qty 5

## 2024-01-20 MED ORDER — CHLORHEXIDINE GLUCONATE 0.12 % MT SOLN
15.0000 mL | Freq: Once | OROMUCOSAL | Status: AC
  Administered 2024-01-20: 15 mL via OROMUCOSAL
  Filled 2024-01-20: qty 15

## 2024-01-20 MED ORDER — VASHE WOUND IRRIGATION OPTIME
TOPICAL | Status: DC | PRN
Start: 2024-01-20 — End: 2024-01-20
  Administered 2024-01-20: 34 [oz_av]

## 2024-01-20 MED ORDER — PHENYLEPHRINE 80 MCG/ML (10ML) SYRINGE FOR IV PUSH (FOR BLOOD PRESSURE SUPPORT)
PREFILLED_SYRINGE | INTRAVENOUS | Status: DC | PRN
  Administered 2024-01-20: 160 ug via INTRAVENOUS

## 2024-01-20 MED ORDER — PROPOFOL 500 MG/50ML IV EMUL
INTRAVENOUS | Status: DC | PRN
  Administered 2024-01-20: 100 ug/kg/min via INTRAVENOUS

## 2024-01-20 MED ORDER — LIDOCAINE 2% (20 MG/ML) 5 ML SYRINGE
INTRAMUSCULAR | Status: DC | PRN
  Administered 2024-01-20: 100 mg via INTRAVENOUS

## 2024-01-20 MED ORDER — MIDAZOLAM HCL 2 MG/2ML IJ SOLN
INTRAMUSCULAR | Status: DC | PRN
  Administered 2024-01-20: 2 mg via INTRAVENOUS

## 2024-01-20 SURGICAL SUPPLY — 27 items
BLADE SURG 21 STRL SS (BLADE) ×1 IMPLANT
BNDG COHESIVE 4X5 TAN STRL LF (GAUZE/BANDAGES/DRESSINGS) ×1 IMPLANT
BNDG COHESIVE 6X5 TAN NS LF (GAUZE/BANDAGES/DRESSINGS) IMPLANT
BNDG GAUZE DERMACEA FLUFF 4 (GAUZE/BANDAGES/DRESSINGS) ×1 IMPLANT
CLEANSER WND VASHE 34 (WOUND CARE) IMPLANT
COVER SURGICAL LIGHT HANDLE (MISCELLANEOUS) ×1 IMPLANT
DRAPE U-SHAPE 47X51 STRL (DRAPES) ×1 IMPLANT
DRSG ADAPTIC 3X8 NADH LF (GAUZE/BANDAGES/DRESSINGS) IMPLANT
DURAPREP 26ML APPLICATOR (WOUND CARE) ×1 IMPLANT
ELECTRODE REM PT RTRN 9FT ADLT (ELECTROSURGICAL) ×1 IMPLANT
GAUZE PAD ABD 8X10 STRL (GAUZE/BANDAGES/DRESSINGS) ×1 IMPLANT
GAUZE SPONGE 4X4 12PLY STRL (GAUZE/BANDAGES/DRESSINGS) IMPLANT
GLOVE BIOGEL PI IND STRL 9 (GLOVE) ×1 IMPLANT
GLOVE SURG ORTHO 9.0 STRL STRW (GLOVE) ×1 IMPLANT
GOWN STRL REUS W/ TWL XL LVL3 (GOWN DISPOSABLE) ×2 IMPLANT
KIT BASIN OR (CUSTOM PROCEDURE TRAY) ×1 IMPLANT
KIT TURNOVER KIT B (KITS) ×1 IMPLANT
MANIFOLD NEPTUNE II (INSTRUMENTS) ×1 IMPLANT
NDL 22X1.5 STRL (OR ONLY) (MISCELLANEOUS) IMPLANT
NEEDLE 22X1.5 STRL (OR ONLY) (MISCELLANEOUS) ×1 IMPLANT
NS IRRIG 1000ML POUR BTL (IV SOLUTION) ×1 IMPLANT
PACK ORTHO EXTREMITY (CUSTOM PROCEDURE TRAY) ×1 IMPLANT
PAD ABD 8X10 STRL (GAUZE/BANDAGES/DRESSINGS) IMPLANT
PAD ARMBOARD POSITIONER FOAM (MISCELLANEOUS) ×2 IMPLANT
SUT ETHILON 2 0 PSLX (SUTURE) ×1 IMPLANT
SYR CONTROL 10ML LL (SYRINGE) IMPLANT
TOWEL GREEN STERILE (TOWEL DISPOSABLE) ×1 IMPLANT

## 2024-01-20 NOTE — Interval H&P Note (Signed)
 History and Physical Interval Note:  01/20/2024 11:08 AM  Anthony Park  has presented today for surgery, with the diagnosis of Osteomyelitis Right Great Toe.  The various methods of treatment have been discussed with the patient and family. After consideration of risks, benefits and other options for treatment, the patient has consented to  Procedure(s) with comments: AMPUTATION, TOE (Right) - RIGHT GREAT TOE AMPUTATION as a surgical intervention.  The patient's history has been reviewed, patient examined, no change in status, stable for surgery.  I have reviewed the patient's chart and labs.  Questions were answered to the patient's satisfaction.     Anthony Park

## 2024-01-20 NOTE — H&P (Signed)
 Anthony Park is an 63 y.o. male.   Chief Complaint: Cellulitis and purulent drainage right great toe. HPI: Patient is a 63 year old gentleman who is status post a right second toe amputation in February 3 months ago who presents with acute pain redness and swelling of the right great toe. Patient states he dropped an item on his foot and the toe looked bad. Patient states he started doxycycline  this morning. Patient states that he does have bad neuropathy.   Past Medical History:  Diagnosis Date   AAA (abdominal aortic aneurysm) (HCC)    Arthritis    Diabetes mellitus without complication (HCC)    type 2 controlled with diet    Diabetic toe ulcer (HCC) 11/05/2018   History of kidney stones    passed 1 several years ago   Hyperlipidemia    Hypertension    not on medications   Peripheral arterial disease (HCC)    Peripheral vascular disease (HCC)    stent in left leg due to anerysm     Past Surgical History:  Procedure Laterality Date    Left Transmetatarsal Ampuation (Left Foot)  08/20/2020   ABDOMINAL AORTOGRAM W/LOWER EXTREMITY N/A 08/06/2020   Procedure: ABDOMINAL AORTOGRAM W/LOWER EXTREMITY;  Surgeon: Young Hensen, MD;  Location: [facility];  Service: Cardiovascular;  Laterality: N/A;   ABDOMINAL AORTOGRAM W/LOWER EXTREMITY N/A 10/27/2023   Procedure: ABDOMINAL AORTOGRAM W/LOWER EXTREMITY;  Surgeon: Young Hensen, MD;  Location: [facility];  Service: Cardiovascular;  Laterality: N/A;   AMPUTATION Right 11/07/2018   Procedure: RIGHT 3RD TOE AMPUTATION;  Surgeon: Timothy Ford, MD;  Location: [facility] OR;  Service: Orthopedics;  Laterality: Right;   AMPUTATION Left 08/20/2020   Procedure: Left Transmetatarsal Ampuation;  Surgeon: Young Hensen, MD;  Location: [facility] OR;  Service: Vascular;  Laterality: Left;   AMPUTATION Left 10/03/2020   Procedure: LEFT BELOW KNEE AMPUTATION;  Surgeon: Young Hensen, MD;  Location: [facility] OR;  Service: Vascular;   Laterality: Left;   AMPUTATION Right 11/02/2023   Procedure: RIGHT 2ND TOE AMPUTATION;  Surgeon: Timothy Ford, MD;  Location: [facility] OR;  Service: Orthopedics;  Laterality: Right;   AMPUTATION TOE Left    big toe and one next to it   CATARACT EXTRACTION Bilateral    EYE SURGERY Bilateral    cataracts removed   INSERTION OF ILIAC STENT Left    PERIPHERAL VASCULAR BALLOON ANGIOPLASTY Left 08/06/2020   Procedure: PERIPHERAL VASCULAR BALLOON ANGIOPLASTY;  Surgeon: Young Hensen, MD;  Location: [facility];  Service: Cardiovascular;  Laterality: Left;  Peroneal artery.   PERIPHERAL VASCULAR BALLOON ANGIOPLASTY  10/27/2023   Procedure: PERIPHERAL VASCULAR BALLOON ANGIOPLASTY;  Surgeon: Young Hensen, MD;  Location: [facility];  Service: Cardiovascular;;   TOE AMPUTATION Left 2015   TOTAL HIP ARTHROPLASTY Left 05/23/2020   Procedure: LEFT TOTAL HIP ARTHROPLASTY ANTERIOR APPROACH;  Surgeon: Arnie Lao, MD;  Location: [facility];  Service: Orthopedics;  Laterality: Left;   TOTAL HIP ARTHROPLASTY Right 07/31/2021   Procedure: RIGHT TOTAL HIP ARTHROPLASTY ANTERIOR APPROACH;  Surgeon: Arnie Lao, MD;  Location: [facility];  Service: Orthopedics;  Laterality: Right;   WOUND DEBRIDEMENT Left 09/11/2020   Procedure: DEBRIDEMENT OF LEFT TRANSMETATARSAL WOUND;  Surgeon: Carlene Che, MD;  Location: [facility] OR;  Service: Vascular;  Laterality: Left;    Family History  Problem Relation Age of Onset   COPD Mother    Cataracts Mother  Brain cancer Father    Hypertension Brother    Social History:  reports that he quit smoking about 3 years ago. His smoking use included cigarettes. He started smoking about 44 years ago. He has a 20.5 pack-year smoking history. He has never used smokeless tobacco. He reports current alcohol  use of about 14.0 standard drinks of alcohol  per week. He reports current drug use.  Allergies:  Allergies  Allergen Reactions   Meloxicam   Nausea Only    Upset stomach     No medications prior to admission.    No results found for this or any previous visit (from the past 48 hours). XR Foot Complete Right Result Date: 01/18/2024 Three-view radiographs of the right foot shows destruction of the tuft of the right great toe consistent with osteomyelitis.   Review of Systems  All other systems reviewed and are negative.   There were no vitals taken for this visit. Physical Exam  Patient is alert, oriented, no adenopathy, well-dressed, normal affect, normal respiratory effort. Examination patient has a strong biphasic posterior tibial pulse by Doppler and a dampened biphasic dorsalis pedis pulse by Doppler.  Patient has cellulitis of the great toe he reports purulent drainage from around the nail there is no drainage today.  There is an ulcer over the tip of the toe that probes to bone. Assessment/Plan 1. Ulcer of toe of right foot, unspecified ulcer stage (HCC)   2. Osteomyelitis of great toe of right foot (HCC)       Plan: The destruction of the tuft of the toe is a separate nail infection.  This was not associated with the infection of the second toe.  Will plan for amputation of the right great toe through the MTP joint.  Risk and benefits were discussed including risk of the wound not healing need for additional surgery.  Patient states he understands wished to proceed at this time.  Timothy Ford, MD 01/20/2024, 6:43 AM

## 2024-01-20 NOTE — Op Note (Signed)
 01/20/2024  12:28 PM  PATIENT:  Allison Ivory A Rebello    PRE-OPERATIVE DIAGNOSIS:  Osteomyelitis Right Great Toe  POST-OPERATIVE DIAGNOSIS:  Same  PROCEDURE:  RIGHT GREAT TOE AMPUTATION  SURGEON:  Timothy Ford, MD  PHYSICIAN ASSISTANT:None ANESTHESIA:   General  PREOPERATIVE INDICATIONS:  BEAUREGARD ZENDER is a  63 y.o. male with a diagnosis of Osteomyelitis Right Great Toe who failed conservative measures and elected for surgical management.    The risks benefits and alternatives were discussed with the patient preoperatively including but not limited to the risks of infection, bleeding, nerve injury, cardiopulmonary complications, the need for revision surgery, among others, and the patient was willing to proceed.  OPERATIVE IMPLANTS:   * No implants in log *  @ENCIMAGES @  OPERATIVE FINDINGS: Tissue margins were clear.  OPERATIVE PROCEDURE: Patient was brought the operating room underwent a MAC anesthetic.  After adequate levels anesthesia were obtained patient's right lower extremity was prepped using DuraPrep draped into a sterile field a timeout was called.  Patient underwent a local block with 20 cc of 1% lidocaine  plain.  A fishmouth incision was made just distal to the right great toe MTP joint to include an ulcer over the second metatarsal head.  The great toe was amputated through the MTP joint and the ulcerative tissue was also removed over the second metatarsal.  There is no deep infection no tunneling to bone.  Electrocautery was used hemostasis.  Vashe was used for wound irrigation.  The incision closed using 2-0 nylon a sterile dressing was applied patient was taken the PACU in stable condition.   DISCHARGE PLANNING:  Antibiotic duration: Preoperative antibiotics  Weightbearing: Touchdown weightbearing on the right  Pain medication: Prescription for Percocet  Dressing care/ Wound VAC: Dry dressing  Ambulatory devices: Crutches  Discharge to: Home.  Follow-up: In  the office 1 week post operative.

## 2024-01-20 NOTE — Transfer of Care (Signed)
 Immediate Anesthesia Transfer of Care Note  Patient: Anthony Park  Procedure(s) Performed: RIGHT GREAT TOE AMPUTATION (Right: Toe)  Patient Location: PACU  Anesthesia Type:MAC  Level of Consciousness: awake, alert , and oriented  Airway & Oxygen Therapy: Patient Spontanous Breathing and Patient connected to face mask oxygen  Post-op Assessment: Report given to RN and Post -op Vital signs reviewed and stable  Post vital signs: Reviewed and stable  Last Vitals:  Vitals Value Taken Time  BP 97/64 01/20/24 1220  Temp    Pulse 61 01/20/24 1222  Resp 12 01/20/24 1222  SpO2 93 % 01/20/24 1222  Vitals shown include unfiled device data.  Last Pain:  Vitals:   01/20/24 0940  TempSrc:   PainSc: 0-No pain         Complications: No notable events documented.

## 2024-01-20 NOTE — Anesthesia Postprocedure Evaluation (Signed)
 Anesthesia Post Note  Patient: Anthony Park  Procedure(s) Performed: RIGHT GREAT TOE AMPUTATION (Right: Toe)     Patient location during evaluation: PACU Anesthesia Type: MAC Level of consciousness: awake Pain management: pain level controlled Vital Signs Assessment: post-procedure vital signs reviewed and stable Respiratory status: spontaneous breathing, nonlabored ventilation and respiratory function stable Cardiovascular status: stable and blood pressure returned to baseline Postop Assessment: no apparent nausea or vomiting Anesthetic complications: no   No notable events documented.  Last Vitals:  Vitals:   01/20/24 1230 01/20/24 1238  BP: 117/72 114/81  Pulse: 64 63  Resp: 14 16  Temp:  36.6 C  SpO2: 92% 96%    Last Pain:  Vitals:   01/20/24 1223  TempSrc:   PainSc: 0-No pain                 Conard Decent

## 2024-01-21 ENCOUNTER — Encounter (HOSPITAL_COMMUNITY): Payer: Self-pay | Admitting: Orthopedic Surgery

## 2024-01-26 ENCOUNTER — Ambulatory Visit (INDEPENDENT_AMBULATORY_CARE_PROVIDER_SITE_OTHER): Admitting: Orthopedic Surgery

## 2024-01-26 DIAGNOSIS — M869 Osteomyelitis, unspecified: Secondary | ICD-10-CM

## 2024-01-27 ENCOUNTER — Encounter: Payer: Self-pay | Admitting: Orthopedic Surgery

## 2024-01-27 NOTE — Progress Notes (Signed)
 Office Visit Note   Patient: Anthony Park           Date of Birth: [birth year]           MRN: [id number] Visit Date: 01/26/2024              Requested by: Azalia Leo, MD [address] [place],  [place] [address] PCP: Azalia Leo, MD  Chief Complaint  Patient presents with   Right Foot - Routine Post Op    01/20/24 right great toe amputation      HPI: Patient is a 63 year old gentleman who is 1 week status post right great toe amputation.  Assessment & Plan: Visit Diagnoses:  1. Osteomyelitis of great toe of right foot (HCC)     Plan: Recommended minimize weightbearing Dial soap cleansing dry dressing change as needed.  Follow-Up Instructions: Return in about 1 week (around 02/02/2024).   Ortho Exam  Patient is alert, oriented, no adenopathy, well-dressed, normal affect, normal respiratory effort. Examination there is increased swelling with slight wound dehiscence.  There are no ischemic changes no cellulitis no drainage.  There is 1 mm of granulation tissue along the wound edges.  Imaging: No results found. No images are attached to the encounter.  Labs: Lab Results  Component Value Date   HGBA1C 6.5 (H) 07/24/2021   HGBA1C 6.2 (H) 10/03/2020   HGBA1C 6.8 (H) 05/20/2020   CRP 3.8 (H) 10/03/2020   CRP 53.7 (H) 09/23/2020   LABURIC 6.9 07/29/2020   REPTSTATUS 09/16/2020 FINAL 09/11/2020   GRAMSTAIN  09/11/2020    RARE WBC PRESENT, PREDOMINANTLY MONONUCLEAR MODERATE GRAM NEGATIVE RODS FEW GRAM VARIABLE ROD    CULT  09/11/2020    MODERATE PSEUDOMONAS AERUGINOSA RARE ESCHERICHIA COLI NO ANAEROBES ISOLATED Performed at [facility], [address] [place], [place] [address]    LABORGA PSEUDOMONAS AERUGINOSA 09/11/2020   LABORGA ESCHERICHIA COLI 09/11/2020     Lab Results  Component Value Date   ALBUMIN  3.5 01/20/2024   ALBUMIN  3.7 11/02/2023   ALBUMIN  3.4 (L) 10/03/2020    Lab Results  Component Value Date   MG 1.9 08/24/2020   MG 1.6 (L)  08/23/2020   No results found for: "VD25OH"  No results found for: "PREALBUMIN"    Latest Ref Rng & Units 01/20/2024    9:22 AM 11/02/2023    6:42 AM 10/27/2023    9:00 AM  CBC EXTENDED  WBC 4.0 - 10.5 K/uL 8.0  8.6    RBC 4.22 - 5.81 MIL/uL 4.62  4.58    Hemoglobin 13.0 - 17.0 g/dL 82.9  56.2  13.0   HCT 39.0 - 52.0 % 39.4  40.0  40.0   Platelets 150 - 400 K/uL 202  255       There is no height or weight on file to calculate BMI.  Orders:  No orders of the defined types were placed in this encounter.  No orders of the defined types were placed in this encounter.    Procedures: No procedures performed  Clinical Data: No additional findings.  ROS:  All other systems negative, except as noted in the HPI. Review of Systems  Objective: Vital Signs: There were no vitals taken for this visit.  Specialty Comments:  No specialty comments available.  PMFS History: Patient Active Problem List   Diagnosis Date Noted   Osteomyelitis of great toe of right foot (HCC) 01/20/2024   Osteomyelitis of second toe of right foot (HCC) 11/02/2023  Hypogonadism, male 01/19/2023   Erectile dysfunction 01/19/2023   Aneurysm of abdominal vessel (HCC) 01/19/2023   Chronic kidney disease, stage 3a (HCC) 01/19/2023   AAA (abdominal aortic aneurysm) without rupture (HCC) 01/18/2023   S/P BKA (below knee amputation) unilateral, left (HCC) 10/03/2020   Non-healing wound of lower extremity 10/03/2020   History of amputation of left foot through metatarsal bone (HCC) 09/11/2020   PAD (peripheral artery disease) (HCC) 08/20/2020   Status post total replacement of left hip 06/05/2020   Unilateral primary osteoarthritis, right hip 04/17/2020   Unilateral primary osteoarthritis, left hip 04/17/2020   Amputated toe, right (HCC) 11/22/2018   Type 2 diabetes mellitus with foot ulcer, without long-term current use of insulin  (HCC) 11/22/2018   Mixed hyperlipidemia 11/22/2018   Aneurysm of left  popliteal artery (HCC) 11/22/2018   Status post peripheral artery angioplasty with insertion of stent 11/22/2018   Osteomyelitis of third toe of right foot (HCC)    Cellulitis of third toe of right foot    Past Medical History:  Diagnosis Date   AAA (abdominal aortic aneurysm) (HCC)    Arthritis    Diabetes mellitus without complication (HCC)    type 2 controlled with diet    Diabetic toe ulcer (HCC) 11/05/2018   History of kidney stones    passed 1 several years ago   Hyperlipidemia    Hypertension    not on medications   Peripheral arterial disease (HCC)    Peripheral vascular disease (HCC)    stent in left leg due to anerysm     Family History  Problem Relation Age of Onset   COPD Mother    Cataracts Mother    Brain cancer Father    Hypertension Brother     Past Surgical History:  Procedure Laterality Date    Left Transmetatarsal Ampuation (Left Foot)  08/20/2020   ABDOMINAL AORTOGRAM W/LOWER EXTREMITY N/A 08/06/2020   Procedure: ABDOMINAL AORTOGRAM W/LOWER EXTREMITY;  Surgeon: Young Hensen, MD;  Location: [facility];  Service: Cardiovascular;  Laterality: N/A;   ABDOMINAL AORTOGRAM W/LOWER EXTREMITY N/A 10/27/2023   Procedure: ABDOMINAL AORTOGRAM W/LOWER EXTREMITY;  Surgeon: Young Hensen, MD;  Location: [facility];  Service: Cardiovascular;  Laterality: N/A;   AMPUTATION Right 11/07/2018   Procedure: RIGHT 3RD TOE AMPUTATION;  Surgeon: Timothy Ford, MD;  Location: [facility] OR;  Service: Orthopedics;  Laterality: Right;   AMPUTATION Left 08/20/2020   Procedure: Left Transmetatarsal Ampuation;  Surgeon: Young Hensen, MD;  Location: [facility] OR;  Service: Vascular;  Laterality: Left;   AMPUTATION Left 10/03/2020   Procedure: LEFT BELOW KNEE AMPUTATION;  Surgeon: Young Hensen, MD;  Location: [facility] OR;  Service: Vascular;  Laterality: Left;   AMPUTATION Right 11/02/2023   Procedure: RIGHT 2ND TOE AMPUTATION;  Surgeon: Timothy Ford, MD;   Location: [facility] OR;  Service: Orthopedics;  Laterality: Right;   AMPUTATION TOE Left    big toe and one next to it   AMPUTATION TOE Right 01/20/2024   Procedure: RIGHT GREAT TOE AMPUTATION;  Surgeon: Timothy Ford, MD;  Location: [facility] OR;  Service: Orthopedics;  Laterality: Right;  RIGHT GREAT TOE AMPUTATION   CATARACT EXTRACTION Bilateral    EYE SURGERY Bilateral    cataracts removed   INSERTION OF ILIAC STENT Left    PERIPHERAL VASCULAR BALLOON ANGIOPLASTY Left 08/06/2020   Procedure: PERIPHERAL VASCULAR BALLOON ANGIOPLASTY;  Surgeon: Young Hensen, MD;  Location: [facility];  Service: Cardiovascular;  Laterality: Left;  Peroneal artery.   PERIPHERAL VASCULAR BALLOON ANGIOPLASTY  10/27/2023   Procedure: PERIPHERAL VASCULAR BALLOON ANGIOPLASTY;  Surgeon: Young Hensen, MD;  Location: [facility];  Service: Cardiovascular;;   TOE AMPUTATION Left 2015   TOTAL HIP ARTHROPLASTY Left 05/23/2020   Procedure: LEFT TOTAL HIP ARTHROPLASTY ANTERIOR APPROACH;  Surgeon: Arnie Lao, MD;  Location: [facility];  Service: Orthopedics;  Laterality: Left;   TOTAL HIP ARTHROPLASTY Right 07/31/2021   Procedure: RIGHT TOTAL HIP ARTHROPLASTY ANTERIOR APPROACH;  Surgeon: Arnie Lao, MD;  Location: [facility];  Service: Orthopedics;  Laterality: Right;   WOUND DEBRIDEMENT Left 09/11/2020   Procedure: DEBRIDEMENT OF LEFT TRANSMETATARSAL WOUND;  Surgeon: Carlene Che, MD;  Location: [facility] OR;  Service: Vascular;  Laterality: Left;   Social History   Occupational History   Not on file  Tobacco Use   Smoking status: Former    Current packs/day: 0.00    Average packs/day: 0.5 packs/day for 41.0 years (20.5 ttl pk-yrs)    Types: Cigarettes    Start date: 07/21/1979    Quit date: 07/20/2020    Years since quitting: 3.5   Smokeless tobacco: Never  Vaping Use   Vaping status: Never Used  Substance and Sexual Activity   Alcohol  use: Yes    Alcohol /week: 14.0 standard  drinks of alcohol     Types: 14 Standard drinks or equivalent per week    Comment: daily   Drug use: Yes    Comment: marijuana   Sexual activity: Yes

## 2024-02-02 ENCOUNTER — Ambulatory Visit (INDEPENDENT_AMBULATORY_CARE_PROVIDER_SITE_OTHER): Admitting: Orthopedic Surgery

## 2024-02-02 ENCOUNTER — Encounter: Payer: Self-pay | Admitting: Orthopedic Surgery

## 2024-02-02 DIAGNOSIS — M869 Osteomyelitis, unspecified: Secondary | ICD-10-CM

## 2024-02-02 DIAGNOSIS — Z89512 Acquired absence of left leg below knee: Secondary | ICD-10-CM

## 2024-02-02 DIAGNOSIS — S88112D Complete traumatic amputation at level between knee and ankle, left lower leg, subsequent encounter: Secondary | ICD-10-CM

## 2024-02-02 NOTE — Progress Notes (Signed)
 Office Visit Note   Patient: Anthony Park           Date of Birth: [birth year]           MRN: [id number] Visit Date: 02/02/2024              Requested by: Azalia Leo, MD [address] [place],  [place] [address] PCP: Azalia Leo, MD  Chief Complaint  Patient presents with   Right Foot - Routine Post Op    01/20/24 right great toe amputation      HPI: Patient is a 63 year old gentleman who is 2 weeks status post right great toe amputation.  Assessment & Plan: Visit Diagnoses:  1. Osteomyelitis of great toe of right foot (HCC)   2. Below-knee amputation of left lower extremity, subsequent encounter (HCC)     Plan: Continue current wound care and minimize weightbearing.  Korver sutures at follow-up.  Follow-Up Instructions: Return in about 2 weeks (around 02/16/2024).   Ortho Exam  Patient is alert, oriented, no adenopathy, well-dressed, normal affect, normal respiratory effort. Examination patient's wound is healing much better there is decreased swelling there is no cellulitis there is no drainage.  Imaging: No results found. No images are attached to the encounter.  Labs: Lab Results  Component Value Date   HGBA1C 6.5 (H) 07/24/2021   HGBA1C 6.2 (H) 10/03/2020   HGBA1C 6.8 (H) 05/20/2020   CRP 3.8 (H) 10/03/2020   CRP 53.7 (H) 09/23/2020   LABURIC 6.9 07/29/2020   REPTSTATUS 09/16/2020 FINAL 09/11/2020   GRAMSTAIN  09/11/2020    RARE WBC PRESENT, PREDOMINANTLY MONONUCLEAR MODERATE GRAM NEGATIVE RODS FEW GRAM VARIABLE ROD    CULT  09/11/2020    MODERATE PSEUDOMONAS AERUGINOSA RARE ESCHERICHIA COLI NO ANAEROBES ISOLATED Performed at [facility], [address], [place], [place] [address]    LABORGA PSEUDOMONAS AERUGINOSA 09/11/2020   LABORGA ESCHERICHIA COLI 09/11/2020     Lab Results  Component Value Date   ALBUMIN  3.5 01/20/2024   ALBUMIN  3.7 11/02/2023   ALBUMIN  3.4 (L) 10/03/2020    Lab Results  Component Value Date   MG 1.9  08/24/2020   MG 1.6 (L) 08/23/2020   No results found for: "VD25OH"  No results found for: "PREALBUMIN"    Latest Ref Rng & Units 01/20/2024    9:22 AM 11/02/2023    6:42 AM 10/27/2023    9:00 AM  CBC EXTENDED  WBC 4.0 - 10.5 K/uL 8.0  8.6    RBC 4.22 - 5.81 MIL/uL 4.62  4.58    Hemoglobin 13.0 - 17.0 g/dL 41.3  24.4  01.0   HCT 39.0 - 52.0 % 39.4  40.0  40.0   Platelets 150 - 400 K/uL 202  255       There is no height or weight on file to calculate BMI.  Orders:  No orders of the defined types were placed in this encounter.  No orders of the defined types were placed in this encounter.    Procedures: No procedures performed  Clinical Data: No additional findings.  ROS:  All other systems negative, except as noted in the HPI. Review of Systems  Objective: Vital Signs: There were no vitals taken for this visit.  Specialty Comments:  No specialty comments available.  PMFS History: Patient Active Problem List   Diagnosis Date Noted   Osteomyelitis of great toe of right foot (HCC) 01/20/2024   Osteomyelitis of second toe of right foot (HCC)  11/02/2023   Hypogonadism, male 01/19/2023   Erectile dysfunction 01/19/2023   Aneurysm of abdominal vessel (HCC) 01/19/2023   Chronic kidney disease, stage 3a (HCC) 01/19/2023   AAA (abdominal aortic aneurysm) without rupture (HCC) 01/18/2023   S/P BKA (below knee amputation) unilateral, left (HCC) 10/03/2020   Non-healing wound of lower extremity 10/03/2020   History of amputation of left foot through metatarsal bone (HCC) 09/11/2020   PAD (peripheral artery disease) (HCC) 08/20/2020   Status post total replacement of left hip 06/05/2020   Unilateral primary osteoarthritis, right hip 04/17/2020   Unilateral primary osteoarthritis, left hip 04/17/2020   Amputated toe, right (HCC) 11/22/2018   Type 2 diabetes mellitus with foot ulcer, without long-term current use of insulin  (HCC) 11/22/2018   Mixed hyperlipidemia 11/22/2018    Aneurysm of left popliteal artery (HCC) 11/22/2018   Status post peripheral artery angioplasty with insertion of stent 11/22/2018   Osteomyelitis of third toe of right foot (HCC)    Cellulitis of third toe of right foot    Past Medical History:  Diagnosis Date   AAA (abdominal aortic aneurysm) (HCC)    Arthritis    Diabetes mellitus without complication (HCC)    type 2 controlled with diet    Diabetic toe ulcer (HCC) 11/05/2018   History of kidney stones    passed 1 several years ago   Hyperlipidemia    Hypertension    not on medications   Peripheral arterial disease (HCC)    Peripheral vascular disease (HCC)    stent in left leg due to anerysm     Family History  Problem Relation Age of Onset   COPD Mother    Cataracts Mother    Brain cancer Father    Hypertension Brother     Past Surgical History:  Procedure Laterality Date    Left Transmetatarsal Ampuation (Left Foot)  08/20/2020   ABDOMINAL AORTOGRAM W/LOWER EXTREMITY N/A 08/06/2020   Procedure: ABDOMINAL AORTOGRAM W/LOWER EXTREMITY;  Surgeon: Young Hensen, MD;  Location: [facility];  Service: Cardiovascular;  Laterality: N/A;   ABDOMINAL AORTOGRAM W/LOWER EXTREMITY N/A 10/27/2023   Procedure: ABDOMINAL AORTOGRAM W/LOWER EXTREMITY;  Surgeon: Young Hensen, MD;  Location: [facility];  Service: Cardiovascular;  Laterality: N/A;   AMPUTATION Right 11/07/2018   Procedure: RIGHT 3RD TOE AMPUTATION;  Surgeon: Timothy Ford, MD;  Location: [facility] OR;  Service: Orthopedics;  Laterality: Right;   AMPUTATION Left 08/20/2020   Procedure: Left Transmetatarsal Ampuation;  Surgeon: Young Hensen, MD;  Location: [facility] OR;  Service: Vascular;  Laterality: Left;   AMPUTATION Left 10/03/2020   Procedure: LEFT BELOW KNEE AMPUTATION;  Surgeon: Young Hensen, MD;  Location[facility];  Service: Vascular;  Laterality: Left;   AMPUTATION Right 11/02/2023   Procedure: RIGHT 2ND TOE AMPUTATION;  Surgeon: Timothy Ford, MD;  Location: [facility] OR;  Service: Orthopedics;  Laterality: Right;   AMPUTATION TOE Left    big toe and one next to it   AMPUTATION TOE Right 01/20/2024   Procedure: RIGHT GREAT TOE AMPUTATION;  Surgeon: Timothy Ford, MD;  Location: [facility] OR;  Service: Orthopedics;  Laterality: Right;  RIGHT GREAT TOE AMPUTATION   CATARACT EXTRACTION Bilateral    EYE SURGERY Bilateral    cataracts removed   INSERTION OF ILIAC STENT Left    PERIPHERAL VASCULAR BALLOON ANGIOPLASTY Left 08/06/2020   Procedure: PERIPHERAL VASCULAR BALLOON ANGIOPLASTY;  Surgeon: Young Hensen, MD;  Location: [facility];  Service: Cardiovascular;  Laterality: Left;  Peroneal artery.   PERIPHERAL VASCULAR BALLOON ANGIOPLASTY  10/27/2023   Procedure: PERIPHERAL VASCULAR BALLOON ANGIOPLASTY;  Surgeon: Young Hensen, MD;  Location: [facility];  Service: Cardiovascular;;   TOE AMPUTATION Left 2015   TOTAL HIP ARTHROPLASTY Left 05/23/2020   Procedure: LEFT TOTAL HIP ARTHROPLASTY ANTERIOR APPROACH;  Surgeon: Arnie Lao, MD;  Location: [facility];  Service: Orthopedics;  Laterality: Left;   TOTAL HIP ARTHROPLASTY Right 07/31/2021   Procedure: RIGHT TOTAL HIP ARTHROPLASTY ANTERIOR APPROACH;  Surgeon: Arnie Lao, MD;  Location: [facility];  Service: Orthopedics;  Laterality: Right;   WOUND DEBRIDEMENT Left 09/11/2020   Procedure: DEBRIDEMENT OF LEFT TRANSMETATARSAL WOUND;  Surgeon: Carlene Che, MD;  Location: [facility] OR;  Service: Vascular;  Laterality: Left;   Social History   Occupational History   Not on file  Tobacco Use   Smoking status: Former    Current packs/day: 0.00    Average packs/day: 0.5 packs/day for 41.0 years (20.5 ttl pk-yrs)    Types: Cigarettes    Start date: 07/21/1979    Quit date: 07/20/2020    Years since quitting: 3.5   Smokeless tobacco: Never  Vaping Use   Vaping status: Never Used  Substance and Sexual Activity   Alcohol  use: Yes    Alcohol /week: 14.0  standard drinks of alcohol     Types: 14 Standard drinks or equivalent per week    Comment: daily   Drug use: Yes    Comment: marijuana   Sexual activity: Yes

## 2024-02-20 ENCOUNTER — Encounter: Admitting: Orthopedic Surgery

## 2024-02-21 ENCOUNTER — Ambulatory Visit (INDEPENDENT_AMBULATORY_CARE_PROVIDER_SITE_OTHER): Admitting: Physician Assistant

## 2024-02-21 DIAGNOSIS — M869 Osteomyelitis, unspecified: Secondary | ICD-10-CM

## 2024-02-21 NOTE — Progress Notes (Signed)
 Office Visit Note   Patient: Anthony Park           Date of Birth: [birth year]           MRN: [id number] Visit Date: 02/21/2024              Requested by: Azalia Leo, MD [address] [place],  [place] [address] PCP: Azalia Leo, MD  No chief complaint on file.     HPI: Anthony Park is a pleasant 63 year old gentleman who comes in today's 1 month postop status post great toe amputation with Dr. Julio Ohm.  He feels he is doing well.  Denies any fever or chills.  He has only had scant clear yellow drainage occasionally from the wound.  Assessment & Plan: Visit Diagnoses:  1. Osteomyelitis of great toe of right foot (HCC)     Plan: 1 month status post above procedure, doing well.  Offered him a filler and a custom orthotic he is declined this and says he feels comfortable as he is.  His foot actually looks quite good with no evidence of dehiscence to scant drainage no ascending cellulitis.  Will have 1 final follow-up with Erin in about 3 weeks  Follow-Up Instructions: No follow-ups on file.   Ortho Exam  Patient is alert, oriented, no adenopathy, well-dressed, normal affect, normal respiratory effort. Examination of his right foot.  He is neuro vastly intact palpable pulse he is well healed surgical incision just scant clear liquid drainage no ascending cellulitis sutures removed today without difficulty compartments are soft and compressible minimal soft tissue swelling    Imaging: No results found. No images are attached to the encounter.  Labs: Lab Results  Component Value Date   HGBA1C 6.5 (H) 07/24/2021   HGBA1C 6.2 (H) 10/03/2020   HGBA1C 6.8 (H) 05/20/2020   CRP 3.8 (H) 10/03/2020   CRP 53.7 (H) 09/23/2020   LABURIC 6.9 07/29/2020   REPTSTATUS 09/16/2020 FINAL 09/11/2020   GRAMSTAIN  09/11/2020    RARE WBC PRESENT, PREDOMINANTLY MONONUCLEAR MODERATE GRAM NEGATIVE RODS FEW GRAM VARIABLE ROD    CULT  09/11/2020    MODERATE PSEUDOMONAS AERUGINOSA RARE ESCHERICHIA  COLI NO ANAEROBES ISOLATED Performed at [facility], [address], [place], [place] [address]    LABORGA PSEUDOMONAS AERUGINOSA 09/11/2020   LABORGA ESCHERICHIA COLI 09/11/2020     Lab Results  Component Value Date   ALBUMIN  3.5 01/20/2024   ALBUMIN  3.7 11/02/2023   ALBUMIN  3.4 (L) 10/03/2020    Lab Results  Component Value Date   MG 1.9 08/24/2020   MG 1.6 (L) 08/23/2020   No results found for: "VD25OH"  No results found for: "PREALBUMIN"    Latest Ref Rng & Units 01/20/2024    9:22 AM 11/02/2023    6:42 AM 10/27/2023    9:00 AM  CBC EXTENDED  WBC 4.0 - 10.5 K/uL 8.0  8.6    RBC 4.22 - 5.81 MIL/uL 4.62  4.58    Hemoglobin 13.0 - 17.0 g/dL 82.9  56.2  13.0   HCT 39.0 - 52.0 % 39.4  40.0  40.0   Platelets 150 - 400 K/uL 202  255       There is no height or weight on file to calculate BMI.  Orders:  No orders of the defined types were placed in this encounter.  No orders of the defined types were placed in this encounter.    Procedures: No procedures performed  Clinical Data: No additional  findings.  ROS:  All other systems negative, except as noted in the HPI. Review of Systems  Objective: Vital Signs: There were no vitals taken for this visit.  Specialty Comments:  No specialty comments available.  PMFS History: Patient Active Problem List   Diagnosis Date Noted   Osteomyelitis of great toe of right foot (HCC) 01/20/2024   Osteomyelitis of second toe of right foot (HCC) 11/02/2023   Hypogonadism, male 01/19/2023   Erectile dysfunction 01/19/2023   Aneurysm of abdominal vessel (HCC) 01/19/2023   Chronic kidney disease, stage 3a (HCC) 01/19/2023   AAA (abdominal aortic aneurysm) without rupture (HCC) 01/18/2023   S/P BKA (below knee amputation) unilateral, left (HCC) 10/03/2020   Non-healing wound of lower extremity 10/03/2020   History of amputation of left foot through metatarsal bone (HCC) 09/11/2020   PAD (peripheral artery disease)  (HCC) 08/20/2020   Status post total replacement of left hip 06/05/2020   Unilateral primary osteoarthritis, right hip 04/17/2020   Unilateral primary osteoarthritis, left hip 04/17/2020   Amputated toe, right (HCC) 11/22/2018   Type 2 diabetes mellitus with foot ulcer, without long-term current use of insulin  (HCC) 11/22/2018   Mixed hyperlipidemia 11/22/2018   Aneurysm of left popliteal artery (HCC) 11/22/2018   Status post peripheral artery angioplasty with insertion of stent 11/22/2018   Osteomyelitis of third toe of right foot (HCC)    Cellulitis of third toe of right foot    Past Medical History:  Diagnosis Date   AAA (abdominal aortic aneurysm) (HCC)    Arthritis    Diabetes mellitus without complication (HCC)    type 2 controlled with diet    Diabetic toe ulcer (HCC) 11/05/2018   History of kidney stones    passed 1 several years ago   Hyperlipidemia    Hypertension    not on medications   Peripheral arterial disease (HCC)    Peripheral vascular disease (HCC)    stent in left leg due to anerysm     Family History  Problem Relation Age of Onset   COPD Mother    Cataracts Mother    Brain cancer Father    Hypertension Brother     Past Surgical History:  Procedure Laterality Date    Left Transmetatarsal Ampuation (Left Foot)  08/20/2020   ABDOMINAL AORTOGRAM W/LOWER EXTREMITY N/A 08/06/2020   Procedure: ABDOMINAL AORTOGRAM W/LOWER EXTREMITY;  Surgeon: Young Hensen, MD;  Location: [facility];  Service: Cardiovascular;  Laterality: N/A;   ABDOMINAL AORTOGRAM W/LOWER EXTREMITY N/A 10/27/2023   Procedure: ABDOMINAL AORTOGRAM W/LOWER EXTREMITY;  Surgeon: Young Hensen, MD;  Location: [facility];  Service: Cardiovascular;  Laterality: N/A;   AMPUTATION Right 11/07/2018   Procedure: RIGHT 3RD TOE AMPUTATION;  Surgeon: Timothy Ford, MD;  Location: [facility] OR;  Service: Orthopedics;  Laterality: Right;   AMPUTATION Left 08/20/2020   Procedure: Left  Transmetatarsal Ampuation;  Surgeon: Young Hensen, MD;  Location: [facility] OR;  Service: Vascular;  Laterality: Left;   AMPUTATION Left 10/03/2020   Procedure: LEFT BELOW KNEE AMPUTATION;  Surgeon: Young Hensen, MD;  Location: [facility] OR;  Service: Vascular;  Laterality: Left;   AMPUTATION Right 11/02/2023   Procedure: RIGHT 2ND TOE AMPUTATION;  Surgeon: Timothy Ford, MD;  Location: [facility] OR;  Service: Orthopedics;  Laterality: Right;   AMPUTATION TOE Left    big toe and one next to it   AMPUTATION TOE Right 01/20/2024   Procedure: RIGHT GREAT TOE AMPUTATION;  Surgeon:  Timothy Ford, MD;  Location: [facility] OR;  Service: Orthopedics;  Laterality: Right;  RIGHT GREAT TOE AMPUTATION   CATARACT EXTRACTION Bilateral    EYE SURGERY Bilateral    cataracts removed   INSERTION OF ILIAC STENT Left    PERIPHERAL VASCULAR BALLOON ANGIOPLASTY Left 08/06/2020   Procedure: PERIPHERAL VASCULAR BALLOON ANGIOPLASTY;  Surgeon: Young Hensen, MD;  Location: [facility];  Service: Cardiovascular;  Laterality: Left;  Peroneal artery.   PERIPHERAL VASCULAR BALLOON ANGIOPLASTY  10/27/2023   Procedure: PERIPHERAL VASCULAR BALLOON ANGIOPLASTY;  Surgeon: Young Hensen, MD;  Location: [facility];  Service: Cardiovascular;;   TOE AMPUTATION Left 2015   TOTAL HIP ARTHROPLASTY Left 05/23/2020   Procedure: LEFT TOTAL HIP ARTHROPLASTY ANTERIOR APPROACH;  Surgeon: Arnie Lao, MD;  Location: [facility];  Service: Orthopedics;  Laterality: Left;   TOTAL HIP ARTHROPLASTY Right 07/31/2021   Procedure: RIGHT TOTAL HIP ARTHROPLASTY ANTERIOR APPROACH;  Surgeon: Arnie Lao, MD;  Location: [facility];  Service: Orthopedics;  Laterality: Right;   WOUND DEBRIDEMENT Left 09/11/2020   Procedure: DEBRIDEMENT OF LEFT TRANSMETATARSAL WOUND;  Surgeon: Carlene Che, MD;  Location: [facility] OR;  Service: Vascular;  Laterality: Left;   Social History   Occupational History   Not on file  Tobacco Use    Smoking status: Former    Current packs/day: 0.00    Average packs/day: 0.5 packs/day for 41.0 years (20.5 ttl pk-yrs)    Types: Cigarettes    Start date: 07/21/1979    Quit date: 07/20/2020    Years since quitting: 3.5   Smokeless tobacco: Never  Vaping Use   Vaping status: Never Used  Substance and Sexual Activity   Alcohol  use: Yes    Alcohol /week: 14.0 standard drinks of alcohol     Types: 14 Standard drinks or equivalent per week    Comment: daily   Drug use: Yes    Comment: marijuana   Sexual activity: Yes

## 2024-02-28 DIAGNOSIS — Z89512 Acquired absence of left leg below knee: Secondary | ICD-10-CM | POA: Diagnosis not present

## 2024-03-15 DIAGNOSIS — J439 Emphysema, unspecified: Secondary | ICD-10-CM | POA: Diagnosis not present

## 2024-03-15 DIAGNOSIS — E1142 Type 2 diabetes mellitus with diabetic polyneuropathy: Secondary | ICD-10-CM | POA: Diagnosis not present

## 2024-03-15 DIAGNOSIS — Z89512 Acquired absence of left leg below knee: Secondary | ICD-10-CM | POA: Diagnosis not present

## 2024-03-15 DIAGNOSIS — I129 Hypertensive chronic kidney disease with stage 1 through stage 4 chronic kidney disease, or unspecified chronic kidney disease: Secondary | ICD-10-CM | POA: Diagnosis not present

## 2024-03-15 DIAGNOSIS — I10 Essential (primary) hypertension: Secondary | ICD-10-CM | POA: Diagnosis not present

## 2024-03-15 DIAGNOSIS — I714 Abdominal aortic aneurysm, without rupture, unspecified: Secondary | ICD-10-CM | POA: Diagnosis not present

## 2024-03-15 DIAGNOSIS — N182 Chronic kidney disease, stage 2 (mild): Secondary | ICD-10-CM | POA: Diagnosis not present

## 2024-03-15 DIAGNOSIS — E1151 Type 2 diabetes mellitus with diabetic peripheral angiopathy without gangrene: Secondary | ICD-10-CM | POA: Diagnosis not present

## 2024-03-15 DIAGNOSIS — I739 Peripheral vascular disease, unspecified: Secondary | ICD-10-CM | POA: Diagnosis not present

## 2024-03-15 DIAGNOSIS — E782 Mixed hyperlipidemia: Secondary | ICD-10-CM | POA: Diagnosis not present

## 2024-06-11 DIAGNOSIS — Z87891 Personal history of nicotine dependence: Secondary | ICD-10-CM | POA: Diagnosis not present

## 2024-06-11 DIAGNOSIS — Z0489 Encounter for examination and observation for other specified reasons: Secondary | ICD-10-CM | POA: Diagnosis not present

## 2024-06-11 DIAGNOSIS — Z122 Encounter for screening for malignant neoplasm of respiratory organs: Secondary | ICD-10-CM | POA: Diagnosis not present

## 2024-07-10 ENCOUNTER — Other Ambulatory Visit (HOSPITAL_COMMUNITY): Payer: Self-pay | Admitting: Internal Medicine

## 2024-07-10 DIAGNOSIS — H52223 Regular astigmatism, bilateral: Secondary | ICD-10-CM | POA: Diagnosis not present

## 2024-07-10 DIAGNOSIS — I359 Nonrheumatic aortic valve disorder, unspecified: Secondary | ICD-10-CM

## 2024-07-13 ENCOUNTER — Ambulatory Visit (HOSPITAL_COMMUNITY)
Admission: RE | Admit: 2024-07-13 | Discharge: 2024-07-13 | Disposition: A | Discharge status: Home / Self Care | Source: Ambulatory Visit | Attending: Internal Medicine | Admitting: Internal Medicine

## 2024-07-13 DIAGNOSIS — I359 Nonrheumatic aortic valve disorder, unspecified: Secondary | ICD-10-CM | POA: Diagnosis not present

## 2024-07-13 LAB — ECHOCARDIOGRAM COMPLETE
AR max vel: 1.99 cm2
AV Area VTI: 1.95 cm2
AV Area mean vel: 2.13 cm2
AV Mean grad: 7.5 mmHg
AV Peak grad: 15.1 mmHg
Ao pk vel: 1.94 m/s
Area-P 1/2: 2.82 cm2
MV VTI: 2.11 cm2
S' Lateral: 4.8 cm

## 2024-07-16 ENCOUNTER — Encounter: Payer: Self-pay | Admitting: Radiology

## 2024-07-31 DIAGNOSIS — R82998 Other abnormal findings in urine: Secondary | ICD-10-CM | POA: Diagnosis not present

## 2024-07-31 DIAGNOSIS — R7989 Other specified abnormal findings of blood chemistry: Secondary | ICD-10-CM | POA: Diagnosis not present

## 2024-07-31 LAB — LAB REPORT - SCANNED
A1c: 6.5
Albumin, Urine POC: 8.9
Creatinine, POC: 58.8 mg/dL
EGFR (Non-African Amer.): 85.5
Microalb Creat Ratio: 15

## 2024-08-07 DIAGNOSIS — E1142 Type 2 diabetes mellitus with diabetic polyneuropathy: Secondary | ICD-10-CM | POA: Diagnosis not present

## 2024-08-07 DIAGNOSIS — Z9989 Dependence on other enabling machines and devices: Secondary | ICD-10-CM | POA: Diagnosis not present

## 2024-08-07 DIAGNOSIS — Z87891 Personal history of nicotine dependence: Secondary | ICD-10-CM | POA: Diagnosis not present

## 2024-08-07 DIAGNOSIS — I2510 Atherosclerotic heart disease of native coronary artery without angina pectoris: Secondary | ICD-10-CM | POA: Diagnosis not present

## 2024-08-07 DIAGNOSIS — E1151 Type 2 diabetes mellitus with diabetic peripheral angiopathy without gangrene: Secondary | ICD-10-CM | POA: Diagnosis not present

## 2024-08-07 DIAGNOSIS — I10 Essential (primary) hypertension: Secondary | ICD-10-CM | POA: Diagnosis not present

## 2024-08-07 DIAGNOSIS — M869 Osteomyelitis, unspecified: Secondary | ICD-10-CM | POA: Diagnosis not present

## 2024-08-07 DIAGNOSIS — Z8249 Family history of ischemic heart disease and other diseases of the circulatory system: Secondary | ICD-10-CM | POA: Diagnosis not present

## 2024-08-07 DIAGNOSIS — Z7982 Long term (current) use of aspirin: Secondary | ICD-10-CM | POA: Diagnosis not present

## 2024-08-07 DIAGNOSIS — E785 Hyperlipidemia, unspecified: Secondary | ICD-10-CM | POA: Diagnosis not present

## 2024-08-07 DIAGNOSIS — I714 Abdominal aortic aneurysm, without rupture, unspecified: Secondary | ICD-10-CM | POA: Diagnosis not present

## 2024-08-15 ENCOUNTER — Encounter: Payer: Self-pay | Admitting: Cardiology

## 2024-08-15 ENCOUNTER — Ambulatory Visit: Attending: Physician Assistant | Admitting: Cardiology

## 2024-08-15 VITALS — BP 152/94 | HR 71 | Ht 72.0 in | Wt 240.0 lb

## 2024-08-15 DIAGNOSIS — I5189 Other ill-defined heart diseases: Secondary | ICD-10-CM

## 2024-08-15 DIAGNOSIS — I739 Peripheral vascular disease, unspecified: Secondary | ICD-10-CM | POA: Diagnosis not present

## 2024-08-15 DIAGNOSIS — E782 Mixed hyperlipidemia: Secondary | ICD-10-CM | POA: Diagnosis not present

## 2024-08-15 DIAGNOSIS — I350 Nonrheumatic aortic (valve) stenosis: Secondary | ICD-10-CM

## 2024-08-15 DIAGNOSIS — I712 Thoracic aortic aneurysm, without rupture, unspecified: Secondary | ICD-10-CM | POA: Insufficient documentation

## 2024-08-15 DIAGNOSIS — E11621 Type 2 diabetes mellitus with foot ulcer: Secondary | ICD-10-CM

## 2024-08-15 DIAGNOSIS — L97509 Non-pressure chronic ulcer of other part of unspecified foot with unspecified severity: Secondary | ICD-10-CM

## 2024-08-15 DIAGNOSIS — I7121 Aneurysm of the ascending aorta, without rupture: Secondary | ICD-10-CM

## 2024-08-15 DIAGNOSIS — I7143 Infrarenal abdominal aortic aneurysm, without rupture: Secondary | ICD-10-CM | POA: Diagnosis not present

## 2024-08-15 DIAGNOSIS — I2510 Atherosclerotic heart disease of native coronary artery without angina pectoris: Secondary | ICD-10-CM | POA: Insufficient documentation

## 2024-08-15 MED ORDER — CARVEDILOL 3.125 MG PO TABS: 3.1250 mg | ORAL_TABLET | Freq: Two times a day (BID) | ORAL | 3 refills | Status: AC

## 2024-08-15 MED ORDER — CARVEDILOL 3.125 MG PO TABS: 3.1250 mg | ORAL_TABLET | Freq: Two times a day (BID) | ORAL | 3 refills | Status: DC

## 2024-08-15 NOTE — Progress Notes (Signed)
 Cardiology Office Note:  .   Date:  08/18/2024  ID:  Anthony Park, DOB 1960-11-20, MRN [id number] PCP: Anthony Leita DEL, MD  [facility] Providers Cardiologist:  Alm Clay, MD     Chief Complaint  Patient presents with   New Patient (Initial Visit)    Abnormal echocardiogram with aortic valve calcification and dilated thoracic aorta, coronary calcification    Patient Profile: Anthony Park     Anthony Park is a mildly obese 63 y.o. male former smoker with a PMH notable for DM-2, HTN, HLD, COPD, PAD s/p progression of left-sided amputation now with AKA prosthesis in place who presents here for abnormal echocardiogram , dilated thoracic aorta and aortic valve calcification with coronary calcification  at the request of Anthony Leita DEL, MD.     Anthony Park was seen by Dr. Stephane on March 15, 2024 for 82-month follow-up to discuss diabetes control etc.  His A1c at the time was 6.5.  He indicates he was wanting to maintain control of his diabetes with diet hide-declining the use of metformin , Farxiga or Jardiance.  Apparently he had stopped taking all losartan and was only taking HCTZ.  Because home blood pressures run in the 120s to 130s.  Hestarted taking Repatha along with rosuvastatin  and was doing well.  They talked about his recovering from his toe amputations in February and May.  She chose to order an echocardiogram based on the results but was referred to cardiology  He was contacted about his echocardiogram results revealing dilated heart chambers, diastolic heart failure and prior evidence of calcification of his heart arteries, was referred to cardiology for evaluation.  Was given a diagnosis of chronic diastolic congestive heart failure.  Subjective   Anthony Park presents here today for cardiology evaluation not really sure what he is here for.  He certainly has PAD and is here to see Dr. Gretta again in December per the vascular surgery notes.  He does note there was a  discussion about his blood pressures.  I reviewed his blood pressure medications and he was not exactly sure what medications he is taking..  I reviewed the medicine list we have which is different on the medicine list his PCP provided as such, I am not sure what he is actually taking.  He is not taking any medications for diabetes but he felt like he was taking Benicar.  Both Benicar and lisinopril  listed and he thought he was taking Benicar (per PCP note indicates that he was not)  He is gradually getting established with his left leg prosthesis and is try to get back to being active but acknowledges that over the last 4 months has not been able to be as active symptoms because of issues with his prosthesis.  He has also had issues with osteomyelitis of the right foot and is now had several toes amputated on the right foot.  Other than the issues with his right foot and amputation, he seems be doing fairly well.  He has not had any active or concerning cardiac symptoms.  Cardiovascular ROS: no chest pain or dyspnea on exertion negative for - edema, irregular heartbeat, orthopnea, palpitations, paroxysmal nocturnal dyspnea, rapid heart rate, shortness of breath, or syncope or near syncope, TIA or amaurosis fugax, claudication.  ROS:  Review of Systems - Negative except Balance issues after his amputations.  He has had to make some adjustments to his prosthetic on the left leg.  Has taken a long time  recover from his osteomyelitis episodes.    Objective   PMH: HTN DM-2 with diabetic neuropathy and vascular complications PAD => s/p left BKA and right foot toe amputations AAA CKD 2-3 HLD  PSH: November 2021: left popliteal stent and peroneal angioplasty => left BKA December 2021 February 2025: Right PTA angioplasty => right second toe amputation (February 2025), right great toe amputation May 2025  Family History: Father deceased of brain cancer; mother deceased age 23 COPD) AAA.  1 brother  and 1 sister both healthy.  1 brother died from complications of alcohol .  Social History Father of 3-2 sons 1 daughter.  Lives with significant other.  Former smoker.  No longer able to work.  Used to work at a [employer].  EE knowledges that he L uses marijuana.  Current Meds per [facility] and reviewed with patient  Medication Sig   acetaminophen  (TYLENOL ) 500 MG tablet Take 1,000 mg by mouth every 6 (six) hours as needed for moderate pain.   ascorbic acid (VITAMIN C) 500 MG tablet Take 500 mg by mouth daily.   aspirin  EC 81 MG tablet Take 81 mg by mouth daily. Swallow whole.   Berberine Chloride 500 MG CAPS Take 500 mg by mouth daily.   CITRUS BERGAMOT PO Take 1 capsule by mouth daily.   clopidogrel  (PLAVIX ) 75 MG tablet Take 75 mg by mouth daily.   dapagliflozin propanediol (FARXIGA) 5 MG TABS tablet Take 5 mg by mouth daily. => Not taking   EPIPEN  2-PAK 0.3 MG/0.3ML SOAJ injection Inject 0.3 mg into the muscle as needed for anaphylaxis.   gabapentin  (NEURONTIN ) 300 MG capsule Take 600 mg by mouth 2 (two) times daily.   hydrochlorothiazide (HYDRODIURIL) 25 MG tablet Take 25 mg by mouth daily.   JARDIANCE 10 MG TABS tablet Take 10 mg by mouth daily. ->  NOT taking   lisinopril  (ZESTRIL ) 40 MG tablet Take 1 tablet (40 mg total) by mouth daily. = NOT TAKING>    methocarbamol  (ROBAXIN ) 500 MG tablet Take 1 tablet (500 mg total) by mouth every 6 (six) hours as needed for muscle spasms.   Multiple Vitamin (MULTIVITAMIN WITH MINERALS) TABS tablet Take 1 tablet by mouth 3 (three) times a week.   mupirocin  ointment (BACTROBAN ) 2 % Apply 1 Application topically 2 (two) times daily.   olmesartan (BENICAR) 40 MG tablet Take 40 mg by mouth daily.   oxyCODONE -acetaminophen  (PERCOCET/ROXICET) 5-325 MG tablet Take 1 tablet by mouth every 4 (four) hours as needed.   Red Yeast Rice 600 MG CAPS Take 600 mg by mouth daily.   rosuvastatin  (CRESTOR ) 20 MG tablet Take 20 mg by mouth at  bedtime.   tiZANidine (ZANAFLEX) 2 MG tablet Take 2 mg by mouth at bedtime as needed for muscle spasms.   According to his PCPs notes he was taken HCTZ 25 mg daily, rosuvastatin  20 mg daily, Repatha 140 mg every 2 weeks, aspirin  81 mg, Plavix  and 5 mg daily.  He was not taking Olmesartan 40 mg daily.  Studies Reviewed: Anthony Park   EKG Interpretation Date/Time:  Wednesday August 15 2024 16:05:08 EST Ventricular Rate:  71 PR Interval:  210 QRS Duration:  144 QT Interval:  432 QTC Calculation: 469 R Axis:   1  Text Interpretation: Sinus rhythm with 1st degree A-V block Right bundle branch block When compared with ECG of 27-Oct-2023 09:06, No significant change was found Confirmed by Anner Lenis ([id number]) on 08/15/2024 4:49:46 PM   Labs from PCP dated  07/31/2024: Glucose 139, Cr 0.9, NA 138, K4.6, CO2 30.  Normal LFTs.  LDL direct 37.  TC 156, TG 631.  HDL 46.  A1c 6.5 03/2024: Tc 98, HDL 59, LDL 21, TG 197  CV Studies Echocardiogram: LVEF 55 to 60%.  No RWMA.  Moderate dilation.  Mild concentric LVH.  GR 2 DD.  Normal RV function but mildly enlarged.  Moderate biatrial dilation.  Normal MV.  Moderate AoV calcification with sclerosis but no stenosis.  Aortic dilation noted-measured 45 mm at the root and 46 mm in the ascending aorta.  Normal RAP.  (07/13/2024) CT Angiogram Abdomen Pelvis: 4.3 mm infrarenal abdominal aortic aneurysm.  2.4 cm right common iliac aneurysm and 2.6 cm left common iliac aneurysm.  1.9 cm left internal iliac and 2.7 cm right common femoral aneurysm.  Fatty infiltration of liver.  Coronary and aortic atherosclerosis.  (01/31/2023) Screening Lung CT-[facility]: Heavy Coronary Calcification, Calcified Aortic Valve, Fusiform Dilation of Ascending Thoracic Aorta Measuring 4.2 to 4.4 Cm.  Atherosclerotic Disease of Abdominal Aorta and Origin of Visceral Branches.  (06/11/2024)   Risk Assessment/Calculations:          Physical Exam:   VS:  BP (!) 152/94 (BP Location: Right  Arm, Patient Position: Sitting, Cuff Size: Normal)   Pulse 71   Ht 6' (1.829 m)   Wt 240 lb (108.9 kg)   SpO2 94%   BMI 32.55 kg/m    Wt Readings from Last 3 Encounters:  08/15/24 240 lb (108.9 kg)  01/20/24 240 lb (108.9 kg)  11/02/23 235 lb (106.6 kg)      GEN: Well nourished, well groomed; in no acute distress; relatively healthy appearing with exception of left BKA NECK: No JVD; No carotid bruits CARDIAC: Normal S1, S2; RRR, harsh 1/6 SEM at RUSB.  Otherwise no additional murmurs, rubs, gallops RESPIRATORY:  Clear to auscultation without rales, wheezing or rhonchi ; nonlabored, good air movement. ABDOMEN: Soft, non-tender, non-distended EXTREMITIES:  No RLE edema; left BKA with prosthesis in place.  I did not have him take off his right shoe but he has had 2 toes amputated.  Pulses mildly diminished.     ASSESSMENT AND PLAN: .    Problem List Items Addressed This Visit       Cardiology Problems   AAA (abdominal aortic aneurysm) without rupture - Primary (Chronic)   AAA and iliac aneurysms noted on CT scan last year.  Due for vascular surgery follow-up.  My recommendation is that he contact vascular surgery to schedule appointment.  As indicated in the thoracic aortic section-if the plan is to evaluate with a CT angiogram of the abdomen pelvis, would asked that they just extended to include the thoracic aorta as well.  The plan is to use a Doppler, then we will still need to evaluate thoracic aorta.  Continue risk factor modifications with BP control-adding carvedilol  and reassessing whether he is actually taking olmesartan; lipids well-controlled with current dose of 20 mg rosuvastatin  and Repatha with excellent lipid control.  A1c was 6.5 he has declined either Farxiga or Jardiance use and did not tolerate metformin -will defer that to PCP but would like to see well-controlled lipids.      Relevant Medications   carvedilol  (COREG ) 3.125 MG tablet   carvedilol  (COREG ) 3.125  MG tablet   Coronary artery calcification seen on computed tomography (Chronic)   Not unexpectedly with peripheral artery disease and aortic as well as iliac artery dilation/aneurysm thoracic abdominal aorta and bilateral iliacs,  he has significant coronary calcification.  Not sure how much benefit we will get from ordering a coronary calcium  score as it would simply confirm the presence of calcification.  He is not actively having anginal symptoms, if he were to have vaginal symptoms, I would recommend ischemic evaluation with a coronary CTA or stress PET versus cardiac catheterization.  He is already on aspirin  and Plavix  as well as 20 mg rosuvastatin , Repatha. My understanding when I saw him is that he was taking Benicar but apparently he may not be.  Continue DAPT with aspirin  81 mg daily, Plavix  100 [place] daily, along with rosuvastatin  20 mg daily and Repatha weekly Continue HCTZ, confirm if he is or is not taking Benicar and would likely reinitiate Benicar (he was at 40 mg) With evidence of CAD, will add carvedilol  3.125 mg twice daily and reevaluate BP with APP in roughly 6 weeks.      Relevant Medications   carvedilol  (COREG ) 3.125 MG tablet   carvedilol  (COREG ) 3.125 MG tablet   Mixed hyperlipidemia (Chronic)   Remains on Crestor  20 daily but according to PCP he is taking Repatha now.  Most recent lipids showed dramatic improvement with LDL down to 37. Unfortunately, the triglycerides are still elevated which probably has more to do with his hyperglycemia  If triglycerides continue elevated, would probably need to consider adding Vascepa plus minus fenofibrate.      Relevant Medications   carvedilol  (COREG ) 3.125 MG tablet   carvedilol  (COREG ) 3.125 MG tablet   PAD (peripheral artery disease)   Relevant Medications   carvedilol  (COREG ) 3.125 MG tablet   carvedilol  (COREG ) 3.125 MG tablet   Other Relevant Orders   EKG 12-Lead (Completed)   Senile calcific aortic valve  sclerosis (Chronic)   Aortic valve sclerosis noted but not stenosis. Continue to monitor for progression of murmur and reassess echo in 3-4 years.      Relevant Medications   carvedilol  (COREG ) 3.125 MG tablet   carvedilol  (COREG ) 3.125 MG tablet   Thoracic aortic aneurysm (Chronic)   Not previously identified but noted on echocardiogram.  Recommendation will be to check, recommendation evaluation with chest CT angiogram.  His screening chest CT suggested that the aorta was only 4.2 to 4.4 cm.  He is due to see Vascular Surgery soon, and if there is plans to check a CT angiogram of the abdomen pelvis again, perhaps he could have a CT angiogram chest abdomen pelvis.  At this point, I think there is time to determine the best way to evaluate so he does not have multiple different test done but between pulmonary medicine, PCP and vascular surgery well myself we need to make sure that he does have a CT Angiogram of the Chest-Aorta done to establish true findings.  Otherwise, the main stay of therapy here is blood pressure, lipid and diabetes control which all of which are being done.  Studies are simply for screening and surveillance.  But change baseline risk factor modification.      Relevant Medications   carvedilol  (COREG ) 3.125 MG tablet   carvedilol  (COREG ) 3.125 MG tablet     Other   Diastolic dysfunction without heart failure (Chronic)   Echocardiogram was read as having grade 2 diastolic function but he is not having any active heart failure symptoms of PND and orthopnea or edema.  No exertional dyspnea.  Recommendations measurement of blood pressure.  Need to confirm that he is on Benicar and we will start carvedilol  3.25 mg  twice daily along with his HCTZ. 6-week follow-up with APP-if not on ARB would restart Benicar but if he is on the Benicar then we will titrate up carvedilol .      Type 2 diabetes mellitus with foot ulcer, without long-term current use of insulin  (HCC)  (Chronic)          Follow-Up: Return in about 6 weeks (around 09/26/2024) for BP check with APP, 3-4 month follow-up, Routine follow up with me.  I spent 60 minutes in the care of NILS THOR today including reviewing outside labs from scanned notes from PCP (2 minutes), reviewing studies (echocardiogram and CT abdomen pelvis reviewed-4 minutes), reviewing outside studies (PET/CT from [facility] reviewed-2 minutes), face to face time discussing treatment options (27 minutes), reviewing records from PCPs notes, vascular surgery notes (12 minutes), 13 minutes dictating, and documenting in the encounter.      Signed, Alm MICAEL Clay, MD, MS Alm Clay, M.D., M.S. Interventional Cardiologist  [facility] Pager # [phone]

## 2024-08-15 NOTE — Patient Instructions (Addendum)
 Medication Instructions:   Start taking Carvedilol ( coreg)  3.125 mg twice a day  *If you need a refill on your cardiac medications before your next appointment, please call your pharmacy*   Lab Work: Not needed If you have labs (blood work) drawn today and your tests are completely normal, you will receive your results only by: MyChart Message (if you have MyChart) OR A paper copy in the mail If you have any lab test that is abnormal or we need to change your treatment, we will call you to review the results.   Testing/Procedures:  Not needed  Follow-Up: At [facility], you and your health needs are our priority.  As part of our continuing mission to provide you with exceptional heart care, we have created designated Provider Care Teams.  These Care Teams include your primary Cardiologist (physician) and Advanced Practice Providers (APPs -  Physician Assistants and Nurse Practitioners) who all work together to provide you with the care you need, when you need it.     Your next appointment:   6 week(s)  The format for your next appointment:   In Person  Provider:    Josefa Beauvais NP  or lum Louis NP   and then Alm Clay, MD in 6 months    Please contact Dr Gretta office for an appointment

## 2024-08-18 ENCOUNTER — Encounter: Payer: Self-pay | Admitting: Cardiology

## 2024-08-18 DIAGNOSIS — I5189 Other ill-defined heart diseases: Secondary | ICD-10-CM | POA: Insufficient documentation

## 2024-08-18 NOTE — Assessment & Plan Note (Signed)
 Remains on Crestor  20 daily but according to PCP he is taking Repatha now.  Most recent lipids showed dramatic improvement with LDL down to 37. Unfortunately, the triglycerides are still elevated which probably has more to do with his hyperglycemia  If triglycerides continue elevated, would probably need to consider adding Vascepa plus minus fenofibrate.

## 2024-08-18 NOTE — Assessment & Plan Note (Signed)
 AAA and iliac aneurysms noted on CT scan last year.  Due for vascular surgery follow-up.  My recommendation is that he contact vascular surgery to schedule appointment.  As indicated in the thoracic aortic section-if the plan is to evaluate with a CT angiogram of the abdomen pelvis, would asked that they just extended to include the thoracic aorta as well.  The plan is to use a Doppler, then we will still need to evaluate thoracic aorta.  Continue risk factor modifications with BP control-adding carvedilol  and reassessing whether he is actually taking olmesartan; lipids well-controlled with current dose of 20 mg rosuvastatin  and Repatha with excellent lipid control.  A1c was 6.5 he has declined either Farxiga or Jardiance use and did not tolerate metformin -will defer that to PCP but would like to see well-controlled lipids.

## 2024-08-18 NOTE — Assessment & Plan Note (Addendum)
 Not previously identified but noted on echocardiogram.  Recommendation will be to check, recommendation evaluation with chest CT angiogram.  His screening chest CT suggested that the aorta was only 4.2 to 4.4 cm.  He is due to see Vascular Surgery soon, and if there is plans to check a CT angiogram of the abdomen pelvis again, perhaps he could have a CT angiogram chest abdomen pelvis.  At this point, I think there is time to determine the best way to evaluate so he does not have multiple different test done but between pulmonary medicine, PCP and vascular surgery well myself we need to make sure that he does have a CT Angiogram of the Chest-Aorta done to establish true findings.  Otherwise, the main stay of therapy here is blood pressure, lipid and diabetes control which all of which are being done.  Studies are simply for screening and surveillance.  But change baseline risk factor modification.

## 2024-08-18 NOTE — Assessment & Plan Note (Signed)
 Aortic valve sclerosis noted but not stenosis. Continue to monitor for progression of murmur and reassess echo in 3-4 years.

## 2024-08-18 NOTE — Assessment & Plan Note (Signed)
 Not unexpectedly with peripheral artery disease and aortic as well as iliac artery dilation/aneurysm thoracic abdominal aorta and bilateral iliacs, he has significant coronary calcification.  Not sure how much benefit we will get from ordering a coronary calcium  score as it would simply confirm the presence of calcification.  He is not actively having anginal symptoms, if he were to have vaginal symptoms, I would recommend ischemic evaluation with a coronary CTA or stress PET versus cardiac catheterization.  He is already on aspirin  and Plavix  as well as 20 mg rosuvastatin , Repatha. My understanding when I saw him is that he was taking Benicar but apparently he may not be.  Continue DAPT with aspirin  81 mg daily, Plavix  100 [place] daily, along with rosuvastatin  20 mg daily and Repatha weekly Continue HCTZ, confirm if he is or is not taking Benicar and would likely reinitiate Benicar (he was at 40 mg) With evidence of CAD, will add carvedilol  3.125 mg twice daily and reevaluate BP with APP in roughly 6 weeks.

## 2024-08-18 NOTE — Assessment & Plan Note (Signed)
 Echocardiogram was read as having grade 2 diastolic function but he is not having any active heart failure symptoms of PND and orthopnea or edema.  No exertional dyspnea.  Recommendations measurement of blood pressure.  Need to confirm that he is on Benicar and we will start carvedilol  3.25 mg twice daily along with his HCTZ. 6-week follow-up with APP-if not on ARB would restart Benicar but if he is on the Benicar then we will titrate up carvedilol .

## 2024-08-21 ENCOUNTER — Ambulatory Visit: Admitting: Cardiology

## 2024-09-25 NOTE — Progress Notes (Unsigned)
 " Cardiology Office Note   Date:  09/26/2024  ID:  Anthony Park, DOB Mar 07, 1961, MRN [id number] PCP: Stephane Leita DEL, MD  [facility] Providers Cardiologist:  Alm Clay, MD     History of Present Illness Anthony Park is a 64 y.o. male with history of coronary artery calcification seen on CT, AAA (46 mm 06/2024), PAD (left BKA 2021, three right toes), hyperlipidemia, CKD stage 3a, and T2DM.     He has been followed by vascular suregery for years with extensive PAD status post left BKA and right great, second, and third toe amputation.   Vascular ultrasound 10/04/23 shows evidence of left and right common iliac artery. The largest aortic diameter remains unchanged at 4.3 cm.   Echo 07/13/24 LVEF 55-60%, no RWMA, mild LVH, grade II dd, RV normal, LA and RA moderately dilated, mild MR, aneurysm of aortic root measuring 45 mm, and aneurysm of ascending aorta measuring 46 mm.   He established care with Dr. Clay 08/15/2024 in the setting of hypertension and abdominal aortic aneurysm. He was only taking hydrochlorothiazide, as home BP were in normal range. He was encouraged to schedule overdue follow up with vascular surgery. Carvedilol  was added to BP medication regimen, and if anginal symptoms arise he recommended coronary CTA or stress PET.     He presents today for 6 week follow up in the setting of hypertension. He is currently doing Tai Chi with his significant other four times per week. He does admit to eating a lot of salt in his diet. He takes his BP daily at home with readings of 100-110/70-80's. He has not had vascular surgery follow up. He denies chest pain, shortness of breath, lower extremity edema, fatigue, palpitations, melena, hematuria, hemoptysis, diaphoresis, weakness, presyncope, syncope, orthopnea, and PND.  ROS: All systems negative unless otherwise indicated in HPI.   Studies Reviewed      Cardiac Studies & Procedures    ______________________________________________________________________________________________     ECHOCARDIOGRAM  ECHOCARDIOGRAM COMPLETE 07/13/2024  Narrative ECHOCARDIOGRAM REPORT    Patient Name:   Anthony Park Date of Exam: 07/13/2024 Medical Rec #:  [id number]       Height:       72.0 in Accession #:    [id number]      Weight:       240.0 lb Date of Birth:  1961/01/26       BSA:          2.302 m Patient Age:    63 years        BP:           177/112 mmHg Patient Gender: M               HR:           63 bpm. Exam Location:  [address]  Procedure: 3D Echo, 2D Echo, Cardiac Doppler and Color Doppler (Both Spectral and Color Flow Doppler were utilized during procedure).  Indications:    535.9 Aortic valve calcification  History:        Patient has no prior history of Echocardiogram examinations. Risk Factors:Hypertension, Diabetes, Former Smoker and Dyslipidemia.  Sonographer:    Powell Saras RDCS Referring Phys: [phone] LAURA H WILE  IMPRESSIONS   1. Left ventricular ejection fraction, by estimation, is 55 to 60%. Left ventricular ejection fraction by 3D volume is 56 %. The left ventricle has normal function. The left ventricle has no regional wall motion abnormalities. The left ventricular internal cavity  size was moderately dilated. There is mild eccentric left ventricular hypertrophy. Left ventricular diastolic parameters are consistent with Grade II diastolic dysfunction (pseudonormalization). 2. Right ventricular systolic function is normal. The right ventricular size is mildly enlarged. Tricuspid regurgitation signal is inadequate for assessing PA pressure. 3. Left atrial size was moderately dilated. 4. Right atrial size was moderately dilated. 5. The mitral valve is normal in structure. Mild mitral valve regurgitation. No evidence of mitral stenosis. 6. The aortic valve has an indeterminant number of cusps. There is moderate calcification of the aortic  valve. Aortic valve regurgitation is not visualized. Aortic valve sclerosis/calcification is present, without any evidence of aortic stenosis. 7. Aortic dilatation noted. Aneurysm of the aortic root, measuring 45 mm. Aneurysm of the ascending aorta, measuring 46 mm. 8. The inferior vena cava is normal in size with greater than 50% respiratory variability, suggesting right atrial pressure of 3 mmHg.  FINDINGS Left Ventricle: Left ventricular ejection fraction, by estimation, is 55 to 60%. Left ventricular ejection fraction by 3D volume is 56 %. The left ventricle has normal function. The left ventricle has no regional wall motion abnormalities. The left ventricular internal cavity size was moderately dilated. There is mild eccentric left ventricular hypertrophy. Left ventricular diastolic parameters are consistent with Grade II diastolic dysfunction (pseudonormalization).  Right Ventricle: The right ventricular size is mildly enlarged. No increase in right ventricular wall thickness. Right ventricular systolic function is normal. Tricuspid regurgitation signal is inadequate for assessing PA pressure.  Left Atrium: Left atrial size was moderately dilated.  Right Atrium: Right atrial size was moderately dilated.  Pericardium: There is no evidence of pericardial effusion.  Mitral Valve: The mitral valve is normal in structure. Mild mitral annular calcification. Mild mitral valve regurgitation. No evidence of mitral valve stenosis. MV peak gradient, 5.0 mmHg. The mean mitral valve gradient is 2.0 mmHg.  Tricuspid Valve: The tricuspid valve is normal in structure. Tricuspid valve regurgitation is trivial. No evidence of tricuspid stenosis.  Aortic Valve: The aortic valve has an indeterminant number of cusps. There is moderate calcification of the aortic valve. Aortic valve regurgitation is not visualized. Aortic valve sclerosis/calcification is present, without any evidence of aortic stenosis. Aortic  valve mean gradient measures 7.5 mmHg. Aortic valve peak gradient measures 15.1 mmHg. Aortic valve area, by VTI measures 1.95 cm.  Pulmonic Valve: The pulmonic valve was normal in structure. Pulmonic valve regurgitation is not visualized. No evidence of pulmonic stenosis.  Aorta: Aortic dilatation noted. There is an aneurysm involving the aortic root measuring 45 mm. There is an aneurysm involving the ascending aorta measuring 46 mm.  Venous: The inferior vena cava is normal in size with greater than 50% respiratory variability, suggesting right atrial pressure of 3 mmHg.  IAS/Shunts: No atrial level shunt detected by color flow Doppler.  Additional Comments: 3D was performed not requiring image post processing on an independent workstation and was normal.   LEFT VENTRICLE PLAX 2D LVIDd:         6.30 cm         Diastology LVIDs:         4.80 cm         LV e' medial:    5.18 cm/s LV PW:         1.10 cm         LV E/e' medial:  18.2 LV IVS:        1.10 cm         LV e' lateral:  8.18 cm/s LVOT diam:     2.30 cm         LV E/e' lateral: 11.5 LV SV:         77 LV SV Index:   33 LVOT Area:     4.15 cm        3D Volume EF LV 3D EF:    Left ventricul ar ejection fraction by 3D volume is 56 %.  3D Volume EF: 3D EF:        56 % LV EDV:       121 ml LV ESV:       53 ml LV SV:        68 ml  RIGHT VENTRICLE             IVC RV Basal diam:  4.40 cm     IVC diam: 1.70 cm RV S prime:     11.60 cm/s TAPSE (M-mode): 2.4 cm  LEFT ATRIUM              Index        RIGHT ATRIUM           Index LA diam:        4.80 cm  2.08 cm/m   RA Area:     27.10 cm LA Vol (A2C):   97.9 ml  42.52 ml/m  RA Volume:   90.20 ml  39.18 ml/m LA Vol (A4C):   108.0 ml 46.91 ml/m LA Biplane Vol: 103.0 ml 44.74 ml/m AORTIC VALVE AV Area (Vmax):    1.99 cm AV Area (Vmean):   2.13 cm AV Area (VTI):     1.95 cm AV Vmax:           194.00 cm/s AV Vmean:          124.000 cm/s AV VTI:            0.394  m AV Peak Grad:      15.1 mmHg AV Mean Grad:      7.5 mmHg LVOT Vmax:         93.10 cm/s LVOT Vmean:        63.600 cm/s LVOT VTI:          0.185 m LVOT/AV VTI ratio: 0.47  AORTA Ao Root diam: 4.60 cm Ao Asc diam:  4.60 cm  MITRAL VALVE MV Area (PHT): 2.82 cm     SHUNTS MV Area VTI:   2.11 cm     Systemic VTI:  0.18 m MV Peak grad:  5.0 mmHg     Systemic Diam: 2.30 cm MV Mean grad:  2.0 mmHg MV Vmax:       1.12 m/s MV Vmean:      66.0 cm/s MV Decel Time: 269 msec MV E velocity: 94.40 cm/s MV A velocity: 102.00 cm/s MV E/A ratio:  0.93  Morene Brownie Electronically signed by Morene Brownie Signature Date/Time: 07/13/2024/9:29:25 AM    Final          ______________________________________________________________________________________________      Risk Assessment/Calculations           Physical Exam VS:  BP 122/64   Pulse (!) 56   Ht 6' (1.829 m)   Wt 245 lb 6.4 oz (111.3 kg)   SpO2 97%   BMI 33.28 kg/m        Wt Readings from Last 3 Encounters:  09/26/24 245 lb 6.4 oz (111.3 kg)  08/15/24 240 lb (108.9 kg)  01/20/24 240 lb (108.9  kg)    GEN: Well nourished, well developed in no acute distress NECK: No JVD; No carotid bruits CARDIAC: RRR, no murmurs, rubs, gallops RESPIRATORY:  Clear to auscultation without rales, wheezing or rhonchi  ABDOMEN: Soft, non-tender, non-distended EXTREMITIES:  No edema; No deformity   ASSESSMENT AND PLAN  Hypertension- BP today 150/80. Recheck 122/64. He does check BP at home with readings of 110-110/70-80's. He admits to eating high salt diet. Discussed low salt diet and increase water  intake. Discussed to monitor BP at home at least 2 hours after medications and sitting for 5-10 minutes. BP log given. Heart healthy diet and regular cardiovascular exercise encouraged.  Continue carvedilol  3.125 mg BID, hydrochlorothiazide 25 mg, and olmesartan 40 mg.   AAA without rupture- Vascular ultrasound 10/04/23 shows  evidence of left and right common iliac artery. The largest aortic diameter remains unchanged at 4.3 cm. Recommended follow up with vascular surgery. Referred to vascular surgery.   Per Dr. Anner note form 08/2024  Recommendation will be to check, recommendation evaluation with chest CT angiogram. His screening chest CT suggested that the aorta was only 4.2 to 4.4 cm.  He is due to see Vascular Surgery soon, and if there is plans to check a CT angiogram of the abdomen pelvis again, perhaps he could have a CT angiogram chest abdomen pelvis.  Coronary calcifications noted on CT- Coronary artery calcification noted on chest CT 04/2022. Stable with no anginal symptoms. No indication for ischemic evaluation.  Plan for coronary CTA vs. Nuclear stress test if symptoms occur.  Continue aspirin  81 mg, carvedilol  3.125 mg BID, and rosuvastatin  40 mg.   Hyperlipidemia- Last LDL 37 on 07/31/24. Continue rosuvastatin  20 mg daily.   PAD- s/p left BKA and three right toe amputations. He follows with Dr. Harden and vascular surgery.         Dispo: Follow up with Dr. Anner in 6 months.   Signed, Mardy KATHEE Pizza, FNP  "

## 2024-09-26 ENCOUNTER — Ambulatory Visit

## 2024-09-26 ENCOUNTER — Encounter: Payer: Self-pay | Admitting: General Practice

## 2024-09-26 VITALS — BP 122/64 | HR 56 | Ht 72.0 in | Wt 245.4 lb

## 2024-09-26 DIAGNOSIS — E782 Mixed hyperlipidemia: Secondary | ICD-10-CM

## 2024-09-26 DIAGNOSIS — I2510 Atherosclerotic heart disease of native coronary artery without angina pectoris: Secondary | ICD-10-CM | POA: Diagnosis not present

## 2024-09-26 DIAGNOSIS — I10 Essential (primary) hypertension: Secondary | ICD-10-CM | POA: Diagnosis not present

## 2024-09-26 DIAGNOSIS — I739 Peripheral vascular disease, unspecified: Secondary | ICD-10-CM

## 2024-09-26 DIAGNOSIS — I7143 Infrarenal abdominal aortic aneurysm, without rupture: Secondary | ICD-10-CM | POA: Diagnosis not present

## 2024-09-26 NOTE — Patient Instructions (Signed)
 Medication Instructions:  Your physician recommends that you continue on your current medications as directed. Please refer to the Current Medication list given to you today.  *If you need a refill on your cardiac medications before your next appointment, please call your pharmacy*   Follow-Up: At [facility], you and your health needs are our priority.  As part of our continuing mission to provide you with exceptional heart care, our providers are all part of one team.  This team includes your primary Cardiologist (physician) and Advanced Practice Providers or APPs (Physician Assistants and Nurse Practitioners) who all work together to provide you with the care you need, when you need it.  Your next appointment:   6 month(s)  Provider:   Alm Clay, MD    We recommend signing up for the patient portal called MyChart.  Sign up information is provided on this After Visit Summary.  MyChart is used to connect with patients for Virtual Visits (Telemedicine).  Patients are able to view lab/test results, encounter notes, upcoming appointments, etc.  Non-urgent messages can be sent to your provider as well.   To learn more about what you can do with MyChart, go to [email or web address].   Other Instructions Moinitor your blood pressure and bring readings to your next appointment.  We need to get a better idea of what your blood pressure is running at home. Here are some instructions to follow: - I would recommend using a blood pressure cuff that goes on your arm. The wrist ones can be inaccurate. If you're purchasing one for the first time, try to select one that also reports your heart rate because this can be helpful information as well. - To check your blood pressure, choose a time at least 3 hours after taking your blood pressure medicines. If you can sample it at different times of the day, that's great - it might give you more information about how your blood pressure  fluctuates. Remain seated in a chair for 5 minutes quietly beforehand, then check it.

## 2024-10-08 ENCOUNTER — Other Ambulatory Visit: Payer: Self-pay

## 2024-10-08 ENCOUNTER — Inpatient Hospital Stay (HOSPITAL_COMMUNITY)

## 2024-10-08 ENCOUNTER — Encounter (HOSPITAL_COMMUNITY): Payer: Self-pay

## 2024-10-08 ENCOUNTER — Ambulatory Visit (INDEPENDENT_AMBULATORY_CARE_PROVIDER_SITE_OTHER)

## 2024-10-08 ENCOUNTER — Ambulatory Visit (HOSPITAL_COMMUNITY)
Admission: EM | Admit: 2024-10-08 | Discharge: 2024-10-08 | Disposition: A | Discharge status: Other Acute Inpatient Hospital | Attending: Physician Assistant | Admitting: Physician Assistant

## 2024-10-08 ENCOUNTER — Inpatient Hospital Stay (HOSPITAL_COMMUNITY): Admission: EM | Admit: 2024-10-08 | Discharge: 2024-10-16 | DRG: 464 | Disposition: A | Source: Home / Self Care

## 2024-10-08 ENCOUNTER — Ambulatory Visit (HOSPITAL_BASED_OUTPATIENT_CLINIC_OR_DEPARTMENT_OTHER): Payer: Self-pay

## 2024-10-08 ENCOUNTER — Encounter (HOSPITAL_COMMUNITY): Payer: Self-pay | Admitting: Internal Medicine

## 2024-10-08 DIAGNOSIS — E1022 Type 1 diabetes mellitus with diabetic chronic kidney disease: Secondary | ICD-10-CM | POA: Diagnosis present

## 2024-10-08 DIAGNOSIS — E782 Mixed hyperlipidemia: Secondary | ICD-10-CM | POA: Diagnosis not present

## 2024-10-08 DIAGNOSIS — E1040 Type 1 diabetes mellitus with diabetic neuropathy, unspecified: Secondary | ICD-10-CM | POA: Diagnosis present

## 2024-10-08 DIAGNOSIS — I130 Hypertensive heart and chronic kidney disease with heart failure and stage 1 through stage 4 chronic kidney disease, or unspecified chronic kidney disease: Secondary | ICD-10-CM | POA: Diagnosis present

## 2024-10-08 DIAGNOSIS — L97509 Non-pressure chronic ulcer of other part of unspecified foot with unspecified severity: Secondary | ICD-10-CM | POA: Diagnosis not present

## 2024-10-08 DIAGNOSIS — M79605 Pain in left leg: Secondary | ICD-10-CM | POA: Insufficient documentation

## 2024-10-08 DIAGNOSIS — Z96643 Presence of artificial hip joint, bilateral: Secondary | ICD-10-CM | POA: Diagnosis present

## 2024-10-08 DIAGNOSIS — M869 Osteomyelitis, unspecified: Secondary | ICD-10-CM | POA: Insufficient documentation

## 2024-10-08 DIAGNOSIS — Z7984 Long term (current) use of oral hypoglycemic drugs: Secondary | ICD-10-CM

## 2024-10-08 DIAGNOSIS — Z89512 Acquired absence of left leg below knee: Secondary | ICD-10-CM | POA: Insufficient documentation

## 2024-10-08 DIAGNOSIS — I714 Abdominal aortic aneurysm, without rupture, unspecified: Secondary | ICD-10-CM | POA: Diagnosis present

## 2024-10-08 DIAGNOSIS — I5032 Chronic diastolic (congestive) heart failure: Secondary | ICD-10-CM | POA: Diagnosis present

## 2024-10-08 DIAGNOSIS — Z7982 Long term (current) use of aspirin: Secondary | ICD-10-CM

## 2024-10-08 DIAGNOSIS — Z808 Family history of malignant neoplasm of other organs or systems: Secondary | ICD-10-CM

## 2024-10-08 DIAGNOSIS — Z886 Allergy status to analgesic agent status: Secondary | ICD-10-CM

## 2024-10-08 DIAGNOSIS — E11621 Type 2 diabetes mellitus with foot ulcer: Secondary | ICD-10-CM | POA: Diagnosis present

## 2024-10-08 DIAGNOSIS — E1069 Type 1 diabetes mellitus with other specified complication: Secondary | ICD-10-CM | POA: Diagnosis present

## 2024-10-08 DIAGNOSIS — E1051 Type 1 diabetes mellitus with diabetic peripheral angiopathy without gangrene: Secondary | ICD-10-CM | POA: Diagnosis present

## 2024-10-08 DIAGNOSIS — Z8249 Family history of ischemic heart disease and other diseases of the circulatory system: Secondary | ICD-10-CM

## 2024-10-08 DIAGNOSIS — D649 Anemia, unspecified: Secondary | ICD-10-CM | POA: Diagnosis present

## 2024-10-08 DIAGNOSIS — R008 Other abnormalities of heart beat: Secondary | ICD-10-CM | POA: Diagnosis not present

## 2024-10-08 DIAGNOSIS — Z7902 Long term (current) use of antithrombotics/antiplatelets: Secondary | ICD-10-CM

## 2024-10-08 DIAGNOSIS — T8744 Infection of amputation stump, left lower extremity: Principal | ICD-10-CM | POA: Diagnosis present

## 2024-10-08 DIAGNOSIS — N1831 Chronic kidney disease, stage 3a: Secondary | ICD-10-CM | POA: Diagnosis present

## 2024-10-08 DIAGNOSIS — E871 Hypo-osmolality and hyponatremia: Secondary | ICD-10-CM | POA: Diagnosis not present

## 2024-10-08 DIAGNOSIS — Z87891 Personal history of nicotine dependence: Secondary | ICD-10-CM

## 2024-10-08 DIAGNOSIS — Y835 Amputation of limb(s) as the cause of abnormal reaction of the patient, or of later complication, without mention of misadventure at the time of the procedure: Secondary | ICD-10-CM | POA: Diagnosis present

## 2024-10-08 DIAGNOSIS — E10621 Type 1 diabetes mellitus with foot ulcer: Secondary | ICD-10-CM | POA: Diagnosis present

## 2024-10-08 DIAGNOSIS — M86162 Other acute osteomyelitis, left tibia and fibula: Secondary | ICD-10-CM | POA: Diagnosis present

## 2024-10-08 DIAGNOSIS — Z89421 Acquired absence of other right toe(s): Secondary | ICD-10-CM

## 2024-10-08 DIAGNOSIS — I739 Peripheral vascular disease, unspecified: Secondary | ICD-10-CM | POA: Diagnosis present

## 2024-10-08 DIAGNOSIS — Z794 Long term (current) use of insulin: Secondary | ICD-10-CM

## 2024-10-08 DIAGNOSIS — L03116 Cellulitis of left lower limb: Principal | ICD-10-CM | POA: Diagnosis present

## 2024-10-08 DIAGNOSIS — Z79899 Other long term (current) drug therapy: Secondary | ICD-10-CM

## 2024-10-08 DIAGNOSIS — Z87442 Personal history of urinary calculi: Secondary | ICD-10-CM

## 2024-10-08 DIAGNOSIS — L02416 Cutaneous abscess of left lower limb: Secondary | ICD-10-CM | POA: Diagnosis present

## 2024-10-08 DIAGNOSIS — Z825 Family history of asthma and other chronic lower respiratory diseases: Secondary | ICD-10-CM

## 2024-10-08 DIAGNOSIS — B9562 Methicillin resistant Staphylococcus aureus infection as the cause of diseases classified elsewhere: Secondary | ICD-10-CM | POA: Diagnosis present

## 2024-10-08 DIAGNOSIS — I080 Rheumatic disorders of both mitral and aortic valves: Secondary | ICD-10-CM | POA: Diagnosis present

## 2024-10-08 DIAGNOSIS — Z9842 Cataract extraction status, left eye: Secondary | ICD-10-CM

## 2024-10-08 DIAGNOSIS — Z9841 Cataract extraction status, right eye: Secondary | ICD-10-CM

## 2024-10-08 LAB — CBC WITH DIFFERENTIAL/PLATELET
Abs Immature Granulocytes: 0.08 10*3/uL — ABNORMAL HIGH (ref 0.00–0.07)
Abs Immature Granulocytes: 0.11 10*3/uL — ABNORMAL HIGH (ref 0.00–0.07)
Basophils Absolute: 0.1 10*3/uL (ref 0.0–0.1)
Basophils Absolute: 0.1 10*3/uL (ref 0.0–0.1)
Basophils Relative: 1 %
Basophils Relative: 0 %
Eosinophils Absolute: 0.1 10*3/uL (ref 0.0–0.5)
Eosinophils Absolute: 0.1 10*3/uL (ref 0.0–0.5)
Eosinophils Relative: 0 %
Eosinophils Relative: 0 %
HCT: 41.6 % (ref 39.0–52.0)
HCT: 39.8 % (ref 39.0–52.0)
Hemoglobin: 14.3 g/dL (ref 13.0–17.0)
Hemoglobin: 13.8 g/dL (ref 13.0–17.0)
Immature Granulocytes: 1 %
Immature Granulocytes: 1 %
Lymphocytes Relative: 11 %
Lymphocytes Relative: 11 %
Lymphs Abs: 1.8 10*3/uL (ref 0.7–4.0)
Lymphs Abs: 1.7 10*3/uL (ref 0.7–4.0)
MCH: 29 pg (ref 26.0–34.0)
MCH: 29.4 pg (ref 26.0–34.0)
MCHC: 34.4 g/dL (ref 30.0–36.0)
MCHC: 34.7 g/dL (ref 30.0–36.0)
MCV: 84.4 fL (ref 80.0–100.0)
MCV: 84.7 fL (ref 80.0–100.0)
Monocytes Absolute: 1 10*3/uL (ref 0.1–1.0)
Monocytes Absolute: 1.1 10*3/uL — ABNORMAL HIGH (ref 0.1–1.0)
Monocytes Relative: 6 %
Monocytes Relative: 7 %
Neutro Abs: 13 10*3/uL — ABNORMAL HIGH (ref 1.7–7.7)
Neutro Abs: 13.2 10*3/uL — ABNORMAL HIGH (ref 1.7–7.7)
Neutrophils Relative %: 81 %
Neutrophils Relative %: 81 %
Platelets: 168 10*3/uL (ref 150–400)
Platelets: 164 10*3/uL (ref 150–400)
RBC: 4.93 MIL/uL (ref 4.22–5.81)
RBC: 4.7 MIL/uL (ref 4.22–5.81)
RDW: 12.8 % (ref 11.5–15.5)
RDW: 12.8 % (ref 11.5–15.5)
WBC: 16.1 10*3/uL — ABNORMAL HIGH (ref 4.0–10.5)
WBC: 16.2 10*3/uL — ABNORMAL HIGH (ref 4.0–10.5)
nRBC: 0 % (ref 0.0–0.2)
nRBC: 0 % (ref 0.0–0.2)

## 2024-10-08 LAB — COMPREHENSIVE METABOLIC PANEL WITH GFR
ALT: 31 U/L (ref 0–44)
ALT: 34 U/L (ref 0–44)
AST: 36 U/L (ref 15–41)
AST: 36 U/L (ref 15–41)
Albumin: 4.4 g/dL (ref 3.5–5.0)
Albumin: 4.4 g/dL (ref 3.5–5.0)
Alkaline Phosphatase: 54 U/L (ref 38–126)
Alkaline Phosphatase: 52 U/L (ref 38–126)
Anion gap: 15 (ref 5–15)
Anion gap: 17 — ABNORMAL HIGH (ref 5–15)
BUN: 21 mg/dL (ref 8–23)
BUN: 21 mg/dL (ref 8–23)
CO2: 24 mmol/L (ref 22–32)
CO2: 22 mmol/L (ref 22–32)
Calcium: 9.2 mg/dL (ref 8.9–10.3)
Calcium: 9 mg/dL (ref 8.9–10.3)
Chloride: 95 mmol/L — ABNORMAL LOW (ref 98–111)
Chloride: 94 mmol/L — ABNORMAL LOW (ref 98–111)
Creatinine, Ser: 1.08 mg/dL (ref 0.61–1.24)
Creatinine, Ser: 1 mg/dL (ref 0.61–1.24)
GFR, Estimated: >60 mL/min
GFR, Estimated: >60 mL/min
Glucose, Bld: 151 mg/dL — ABNORMAL HIGH (ref 70–99)
Glucose, Bld: 139 mg/dL — ABNORMAL HIGH (ref 70–99)
Potassium: 3.9 mmol/L (ref 3.5–5.1)
Potassium: 3.9 mmol/L (ref 3.5–5.1)
Sodium: 133 mmol/L — ABNORMAL LOW (ref 135–145)
Sodium: 133 mmol/L — ABNORMAL LOW (ref 135–145)
Total Bilirubin: 0.5 mg/dL (ref 0.0–1.2)
Total Bilirubin: 0.5 mg/dL (ref 0.0–1.2)
Total Protein: 7.4 g/dL (ref 6.5–8.1)
Total Protein: 6.8 g/dL (ref 6.5–8.1)

## 2024-10-08 LAB — CBC
HCT: 42.7 % (ref 39.0–52.0)
Hemoglobin: 14.6 g/dL (ref 13.0–17.0)
MCH: 29 pg (ref 26.0–34.0)
MCHC: 34.2 g/dL (ref 30.0–36.0)
MCV: 84.9 fL (ref 80.0–100.0)
Platelets: 176 10*3/uL (ref 150–400)
RBC: 5.03 MIL/uL (ref 4.22–5.81)
RDW: 13.1 % (ref 11.5–15.5)
WBC: 18.8 10*3/uL — ABNORMAL HIGH (ref 4.0–10.5)
nRBC: 0 % (ref 0.0–0.2)

## 2024-10-08 LAB — C-REACTIVE PROTEIN: CRP: 1.2 mg/dL — ABNORMAL HIGH

## 2024-10-08 LAB — I-STAT CG4 LACTIC ACID, ED
Lactic Acid, Venous: 1.9 mmol/L (ref 0.5–1.9)
Lactic Acid, Venous: 2.9 mmol/L (ref 0.5–1.9)

## 2024-10-08 LAB — CREATININE, SERUM
Creatinine, Ser: 1.07 mg/dL (ref 0.61–1.24)
GFR, Estimated: >60 mL/min

## 2024-10-08 LAB — HEMOGLOBIN A1C
Hgb A1c MFr Bld: 6.9 % — ABNORMAL HIGH (ref 4.8–5.6)
Mean Plasma Glucose: 151.33 mg/dL

## 2024-10-08 LAB — HIV ANTIBODY (ROUTINE TESTING W REFLEX): HIV Screen 4th Generation wRfx: NONREACTIVE

## 2024-10-08 LAB — SEDIMENTATION RATE: Sed Rate: 3 mm/h (ref 0–16)

## 2024-10-08 LAB — CBG MONITORING, ED: Glucose-Capillary: 144 mg/dL — ABNORMAL HIGH (ref 70–99)

## 2024-10-08 MED ORDER — ENOXAPARIN SODIUM 40 MG/0.4ML IJ SOSY
40.0000 mg | PREFILLED_SYRINGE | INTRAMUSCULAR | Status: DC
  Administered 2024-10-08 – 2024-10-15 (×8): 40 mg via SUBCUTANEOUS
  Filled 2024-10-08 (×8): qty 0.4

## 2024-10-08 MED ORDER — VANCOMYCIN HCL 1250 MG/250ML IV SOLN
1250.0000 mg | Freq: Two times a day (BID) | INTRAVENOUS | Status: DC
  Administered 2024-10-09 – 2024-10-16 (×14): 1250 mg via INTRAVENOUS
  Filled 2024-10-08 (×17): qty 250

## 2024-10-08 MED ORDER — GABAPENTIN 300 MG PO CAPS
600.0000 mg | ORAL_CAPSULE | Freq: Two times a day (BID) | ORAL | Status: DC
  Administered 2024-10-08 – 2024-10-12 (×8): 600 mg via ORAL
  Filled 2024-10-08 (×8): qty 2

## 2024-10-08 MED ORDER — LACTATED RINGERS IV SOLN: INTRAVENOUS | Status: AC

## 2024-10-08 MED ORDER — ONDANSETRON 4 MG PO TBDP
ORAL_TABLET | ORAL | Status: AC
  Filled 2024-10-08: qty 1

## 2024-10-08 MED ORDER — INSULIN ASPART 100 UNIT/ML IJ SOLN
0.0000 [IU] | Freq: Three times a day (TID) | INTRAMUSCULAR | Status: DC
  Administered 2024-10-09: 3 [IU] via SUBCUTANEOUS
  Administered 2024-10-09 (×2): 2 [IU] via SUBCUTANEOUS
  Administered 2024-10-10: 8 [IU] via SUBCUTANEOUS
  Administered 2024-10-10: 2 [IU] via SUBCUTANEOUS
  Administered 2024-10-11: 3 [IU] via SUBCUTANEOUS
  Administered 2024-10-11: 2 [IU] via SUBCUTANEOUS
  Administered 2024-10-11: 3 [IU] via SUBCUTANEOUS
  Administered 2024-10-12 (×2): 2 [IU] via SUBCUTANEOUS
  Administered 2024-10-12: 3 [IU] via SUBCUTANEOUS
  Administered 2024-10-13: 2 [IU] via SUBCUTANEOUS
  Administered 2024-10-13: 3 [IU] via SUBCUTANEOUS
  Administered 2024-10-13: 2 [IU] via SUBCUTANEOUS
  Administered 2024-10-14 (×3): 3 [IU] via SUBCUTANEOUS
  Administered 2024-10-15: 15 [IU] via SUBCUTANEOUS
  Administered 2024-10-15 – 2024-10-16 (×2): 3 [IU] via SUBCUTANEOUS
  Administered 2024-10-16: 11 [IU] via SUBCUTANEOUS
  Filled 2024-10-08 (×4): qty 3
  Filled 2024-10-08: qty 2
  Filled 2024-10-08: qty 8
  Filled 2024-10-08 (×2): qty 3
  Filled 2024-10-08: qty 2
  Filled 2024-10-08: qty 3
  Filled 2024-10-08: qty 15
  Filled 2024-10-08: qty 3
  Filled 2024-10-08 (×4): qty 2
  Filled 2024-10-08 (×3): qty 3
  Filled 2024-10-08: qty 11
  Filled 2024-10-08 (×2): qty 2

## 2024-10-08 MED ORDER — GADOBUTROL 1 MMOL/ML IV SOLN
10.0000 mL | Freq: Once | INTRAVENOUS | Status: AC | PRN
  Administered 2024-10-08: 10 mL via INTRAVENOUS

## 2024-10-08 MED ORDER — HYDRALAZINE HCL 20 MG/ML IJ SOLN: 10.0000 mg | Freq: Four times a day (QID) | INTRAMUSCULAR | Status: AC | PRN

## 2024-10-08 MED ORDER — MORPHINE SULFATE (PF) 2 MG/ML IV SOLN
2.0000 mg | INTRAVENOUS | Status: DC | PRN
  Administered 2024-10-08 – 2024-10-11 (×13): 2 mg via INTRAVENOUS
  Filled 2024-10-08 (×14): qty 1

## 2024-10-08 MED ORDER — SODIUM CHLORIDE 0.9 % IV SOLN
2.0000 g | Freq: Three times a day (TID) | INTRAVENOUS | Status: DC
  Administered 2024-10-08 – 2024-10-11 (×7): 2 g via INTRAVENOUS
  Filled 2024-10-08 (×7): qty 12.5

## 2024-10-08 MED ORDER — FENTANYL CITRATE (PF) 50 MCG/ML IJ SOSY
50.0000 ug | PREFILLED_SYRINGE | Freq: Once | INTRAMUSCULAR | Status: AC
  Administered 2024-10-08: 50 ug via INTRAVENOUS
  Filled 2024-10-08: qty 1

## 2024-10-08 MED ORDER — SODIUM CHLORIDE 0.9 % IV SOLN
3.0000 g | Freq: Once | INTRAVENOUS | Status: AC
  Administered 2024-10-08: 3 g via INTRAVENOUS
  Filled 2024-10-08: qty 8

## 2024-10-08 MED ORDER — HYDROCHLOROTHIAZIDE 25 MG PO TABS
25.0000 mg | ORAL_TABLET | Freq: Every day | ORAL | Status: DC
  Administered 2024-10-09 – 2024-10-16 (×7): 25 mg via ORAL
  Filled 2024-10-08 (×7): qty 1

## 2024-10-08 MED ORDER — VANCOMYCIN HCL 2000 MG/400ML IV SOLN
2000.0000 mg | Freq: Once | INTRAVENOUS | Status: AC
  Administered 2024-10-08: 2000 mg via INTRAVENOUS
  Filled 2024-10-08: qty 400

## 2024-10-08 MED ORDER — ONDANSETRON HCL 4 MG/2ML IJ SOLN: 4.0000 mg | Freq: Four times a day (QID) | INTRAMUSCULAR | Status: AC | PRN

## 2024-10-08 MED ORDER — IRBESARTAN 300 MG PO TABS
300.0000 mg | ORAL_TABLET | Freq: Every day | ORAL | Status: AC
  Administered 2024-10-09 – 2024-10-16 (×7): 300 mg via ORAL
  Filled 2024-10-08 (×7): qty 1

## 2024-10-08 MED ORDER — ONDANSETRON 4 MG PO TBDP
4.0000 mg | ORAL_TABLET | Freq: Once | ORAL | Status: AC
  Administered 2024-10-08: 4 mg via ORAL

## 2024-10-08 MED ORDER — CARVEDILOL 3.125 MG PO TABS
3.1250 mg | ORAL_TABLET | Freq: Two times a day (BID) | ORAL | Status: AC
  Administered 2024-10-08 – 2024-10-16 (×16): 3.125 mg via ORAL
  Filled 2024-10-08 (×16): qty 1

## 2024-10-08 MED ORDER — ROSUVASTATIN CALCIUM 20 MG PO TABS
20.0000 mg | ORAL_TABLET | Freq: Every day | ORAL | Status: DC
  Administered 2024-10-09 – 2024-10-15 (×7): 20 mg via ORAL
  Filled 2024-10-08 (×8): qty 1

## 2024-10-08 MED ORDER — INSULIN ASPART 100 UNIT/ML IJ SOLN: 0.0000 [IU] | Freq: Every day | INTRAMUSCULAR | Status: AC

## 2024-10-08 MED ORDER — ONDANSETRON HCL 4 MG PO TABS: 4.0000 mg | ORAL_TABLET | Freq: Four times a day (QID) | ORAL | Status: DC | PRN

## 2024-10-08 NOTE — ED Provider Notes (Signed)
 " [place] [facility] AT [facility] Provider Note   CSN: [id number] Arrival date & time: 10/08/24  8371     Patient presents with: Leg Pain   Anthony Park is a 64 y.o. male.  {Add pertinent medical, surgical, social history, OB history to HPI:32947} HPI     64 year old male with a history of type 2 diabetes, hypertension, hyperlipidemia, peripheral vascular disease, AAA, history of right toe amputations in 2025 due to osteomyelitis,, left BKA 2022, CKD, who presents with concern for possible osteomyelitis of left BKA tibia from urgent care.   Past Medical History:  Diagnosis Date   AAA (abdominal aortic aneurysm)    Arthritis    Diabetes mellitus without complication (HCC)    type 2 controlled with diet    Diabetic toe ulcer (HCC) 11/05/2018   History of kidney stones    passed 1 several years ago   Hyperlipidemia    Hypertension    not on medications   Peripheral arterial disease    Peripheral vascular disease    stent in left leg due to anerysm      Prior to Admission medications  Medication Sig Start Date End Date Taking? Authorizing Provider  acetaminophen  (TYLENOL ) 500 MG tablet Take 1,000 mg by mouth every 6 (six) hours as needed for moderate pain.    [provider]  aspirin  EC 81 MG tablet Take 81 mg by mouth daily. Swallow whole.    [provider]  Berberine Chloride 500 MG CAPS Take 500 mg by mouth daily.    [provider]  carvedilol  (COREG ) 3.125 MG tablet Take 1 tablet (3.125 mg total) by mouth 2 (two) times daily. 08/15/24 11/13/24  Anner Alm ORN, MD  clopidogrel  (PLAVIX ) 75 MG tablet Take 75 mg by mouth daily.    [provider]  EPIPEN  2-PAK 0.3 MG/0.3ML SOAJ injection Inject 0.3 mg into the muscle as needed for anaphylaxis. 02/10/22   [provider]  gabapentin  (NEURONTIN ) 300 MG capsule Take 600 mg by mouth 2 (two) times daily.    [provider]  hydrochlorothiazide   (HYDRODIURIL ) 25 MG tablet Take 25 mg by mouth daily. 07/06/22   [provider]  methocarbamol  (ROBAXIN ) 500 MG tablet Take 1 tablet (500 mg total) by mouth every 6 (six) hours as needed for muscle spasms. 07/31/21   Vernetta Lonni GRADE, MD  Multiple Vitamin (MULTIVITAMIN WITH MINERALS) TABS tablet Take 1 tablet by mouth 3 (three) times a week.    [provider]  mupirocin  ointment (BACTROBAN ) 2 % Apply 1 Application topically 2 (two) times daily. 03/17/22   [provider]  olmesartan (BENICAR) 40 MG tablet Take 40 mg by mouth daily. 07/27/22   [provider]  rosuvastatin  (CRESTOR ) 20 MG tablet Take 20 mg by mouth at bedtime. 04/23/21   [provider]  tiZANidine (ZANAFLEX) 2 MG tablet Take 2 mg by mouth at bedtime as needed for muscle spasms.    [provider]    Allergies: Meloxicam     Review of Systems  Updated Vital Signs BP 134/87   Pulse 82   Temp 99 F (37.2 C)   Resp 18   SpO2 96%   Physical Exam  (all labs ordered are listed, but only abnormal results are displayed) Labs Reviewed  CULTURE, BLOOD (ROUTINE X 2)  CULTURE, BLOOD (ROUTINE X 2)  COMPREHENSIVE METABOLIC PANEL WITH GFR  CBC WITH DIFFERENTIAL/PLATELET    EKG: None  Radiology: DG Tibia/Fibula Left  Result Date: 10/08/2024 CLINICAL DATA:  Leg pain stump area swelling EXAM: LEFT TIBIA AND FIBULA - 2 VIEW COMPARISON:  None Available. FINDINGS: Partially visualized vascular stent with calcifications. No fracture. Status post below the knee amputation. Soft tissue swelling of the distal stump without emphysema. Possible subtle/early erosive change and lucency at the cut margin of the tibia and the distal anterior cortex of the tibia. IMPRESSION: Status post below the knee amputation. Soft tissue swelling of the distal stump without emphysema. Possible subtle/early erosive change and lucency at the cut margin of the tibia and the distal anterior cortex of the  tibia, raising concern for early changes of osteomyelitis. Electronically Signed   By: Luke Bun M.D.   On: 10/08/2024 15:52    {Document cardiac monitor, telemetry assessment procedure when appropriate:32947} Procedures   Medications Ordered in the ED - No data to display    {Click here for ABCD2, HEART and other calculators REFRESH Note before signing:1}                              Medical Decision Making  ***  {Document critical care time when appropriate  Document review of labs and clinical decision tools ie CHADS2VASC2, etc  Document your independent review of radiology images and any outside records  Document your discussion with family members, caretakers and with consultants  Document social determinants of health affecting pt's care  Document your decision making why or why not admission, treatments were needed:32947:::1}   Final diagnoses:  None    ED Discharge Orders     None        "

## 2024-10-08 NOTE — Discharge Instructions (Signed)
Please go to the emergency room for further evaluation and management as we discussed. 

## 2024-10-08 NOTE — ED Provider Triage Note (Signed)
 Emergency Medicine Provider Triage Evaluation Note  Anthony Park , a 64 y.o. male  was evaluated in triage.  Pt complains of possible osteomyelitis.  Patient was seen in urgent care today for lower left leg pain.  Patient had a amputation of the left leg below the knee many years ago.  Patient reports for about a week he has had pain at the end of the amputated leg.  He has noted on and off swelling.  He went to urgent care earlier today where they did an x-ray and the x-ray showed possible early signs of osteomyelitis.  Patient is known to have osteomyelitis previously in his toes.  Patient reports his pain is a 10 out of 10.  He has tenderness to palpation over the site.  No warmth noted.  Patient is still able to move his knee.  He denies any other pain.  He denies any falls or trauma to the area.  Patient is stable while in triage.    Review of Systems  Positive: Leg pain Negative:   Physical Exam  BP 134/87   Pulse 82   Temp 99 F (37.2 C)   Resp 18   SpO2 96%  Gen:   Awake, no distress   Resp:  Normal effort  MSK:   Moves extremities without difficulty however with pain Other:    Medical Decision Making  Medically screening exam initiated at 4:40 PM.  Appropriate orders placed.  Anthony Park was informed that the remainder of the evaluation will be completed by another provider, this initial triage assessment does not replace that evaluation, and the importance of remaining in the ED until their evaluation is complete.     Anthony Park, [place] 10/08/24 1650

## 2024-10-08 NOTE — Progress Notes (Signed)
 Pharmacy Antibiotic Note  Anthony Park is a 64 y.o. male admitted on 10/08/2024 with concern for osteomyelitis s/p amputation.  Pharmacy has been consulted for vancomycin  dosing.  Plan: Vancomycin  2g IV x 1, then 1250g IV q 12h (eAUC 457) Monitor renal function, Cx and clinical progression to narrow Vancomycin  levels as indicated      Temp (24hrs), Avg:98.8 F (37.1 C), Min:98.5 F (36.9 C), Max:99 F (37.2 C)  Recent Labs  Lab 10/08/24 1652 10/08/24 1735  WBC 16.1*  --   CREATININE 1.08  --   LATICACIDVEN  --  1.9    Estimated Creatinine Clearance: 90.2 mL/min (by C-G formula based on SCr of 1.08 mg/dL).    Allergies[1]  Dorn Poot, PharmD, [facility] Clinical Pharmacist ED Pharmacist Phone # [phone] 10/08/2024 6:20 PM      [1]  Allergies Allergen Reactions   Meloxicam  Nausea Only    Upset stomach

## 2024-10-08 NOTE — ED Notes (Signed)
 Patient is being discharged from the Urgent Care and sent to the Emergency Department via POV . Per [employer], PA, patient is in need of higher level of care due to needing further work up. Patient is aware and verbalizes understanding of plan of care.  Vitals:   10/08/24 1446  BP: 113/70  Pulse: 80  Resp: 18  Temp: 98.5 F (36.9 C)  SpO2: 98%

## 2024-10-08 NOTE — H&P (Signed)
 " History and Physical    Patient: Anthony Park FMW:[id number] DOB: 21-Aug-1961 DOA: 10/08/2024 DOS: the patient was seen and examined on 10/08/2024 PCP: Pcp, No  Patient coming from: Home  Chief Complaint:  Chief Complaint  Patient presents with   Leg Pain   HPI: Anthony Park is a 64 y.o. male with medical history significant of type 2 diabetes, abdominal aortic aneurysm, hyperlipidemia, essential hypertension, peripheral vascular disease, who is status post left BKA due to osteomyelitis.  Patient presented to the ER today complaining of pain in the left stump area.  He has also noticed the swelling as well as redness in the area.  His BKA was done in 2022.  He has been managing it up until now.  Denied any fever or chills.  Denied any nausea or vomiting or diarrhea.  Patient had x-ray showing possible osteomyelitis at the tip of the stump.  ER has consulted orthopedics Dr. Crist office but recommended vascular consultation which was done.  His amputation was performed by vascular surgery apparently..  They agreed to see the patient in the morning.  Patient has been admitted therefore with cellulitis of left stump from BKA but also suspected osteomyelitis.  Review of Systems: As mentioned in the history of present illness. All other systems reviewed and are negative. Past Medical History:  Diagnosis Date   AAA (abdominal aortic aneurysm)    Arthritis    Diabetes mellitus without complication (HCC)    type 2 controlled with diet    Diabetic toe ulcer (HCC) 11/05/2018   History of kidney stones    passed 1 several years ago   Hyperlipidemia    Hypertension    not on medications   Peripheral arterial disease    Peripheral vascular disease    stent in left leg due to anerysm    Past Surgical History:  Procedure Laterality Date    Left Transmetatarsal Ampuation (Left Foot)  08/20/2020   ABDOMINAL AORTOGRAM W/LOWER EXTREMITY N/A 08/06/2020   Procedure: ABDOMINAL AORTOGRAM W/LOWER  EXTREMITY;  Surgeon: Gretta Lonni PARAS, MD;  Location: [facility];  Service: Cardiovascular;  Laterality: N/A;   ABDOMINAL AORTOGRAM W/LOWER EXTREMITY N/A 10/27/2023   Procedure: ABDOMINAL AORTOGRAM W/LOWER EXTREMITY;  Surgeon: Gretta Lonni PARAS, MD;  Location: [facility];  Service: Cardiovascular;  Laterality: N/A;   AMPUTATION Right 11/07/2018   Procedure: RIGHT 3RD TOE AMPUTATION;  Surgeon: Harden Jerona GAILS, MD;  Location: [facility] OR;  Service: Orthopedics;  Laterality: Right;   AMPUTATION Left 08/20/2020   Procedure: Left Transmetatarsal Ampuation;  Surgeon: Gretta Lonni PARAS, MD;  Location: [facility] OR;  Service: Vascular;  Laterality: Left;   AMPUTATION Left 10/03/2020   Procedure: LEFT BELOW KNEE AMPUTATION;  Surgeon: Gretta Lonni PARAS, MD;  Location: [facility] OR;  Service: Vascular;  Laterality: Left;   AMPUTATION Right 11/02/2023   Procedure: RIGHT 2ND TOE AMPUTATION;  Surgeon: Harden Jerona GAILS, MD;  Location: [facility] OR;  Service: Orthopedics;  Laterality: Right;   AMPUTATION TOE Left    big toe and one next to it   AMPUTATION TOE Right 01/20/2024   Procedure: RIGHT GREAT TOE AMPUTATION;  Surgeon: Harden Jerona GAILS, MD;  Location: [facility] OR;  Service: Orthopedics;  Laterality: Right;  RIGHT GREAT TOE AMPUTATION   CATARACT EXTRACTION Bilateral    EYE SURGERY Bilateral    cataracts removed   INSERTION OF ILIAC STENT Left    PERIPHERAL VASCULAR BALLOON ANGIOPLASTY Left 08/06/2020   Procedure: PERIPHERAL VASCULAR BALLOON ANGIOPLASTY;  Surgeon: Gretta Lonni PARAS, MD;  Location: [facility] INVASIVE CV LAB;  Service: Cardiovascular;  Laterality: Left;  Peroneal artery.   PERIPHERAL VASCULAR BALLOON ANGIOPLASTY  10/27/2023   Procedure: PERIPHERAL VASCULAR BALLOON ANGIOPLASTY;  Surgeon: Gretta Lonni PARAS, MD;  Location: [facility];  Service: Cardiovascular;;   TOE AMPUTATION Left 2015   TOTAL HIP ARTHROPLASTY Left 05/23/2020   Procedure: LEFT TOTAL HIP ARTHROPLASTY ANTERIOR APPROACH;  Surgeon:  Vernetta Lonni GRADE, MD;  Location: [facility] ORS;  Service: Orthopedics;  Laterality: Left;   TOTAL HIP ARTHROPLASTY Right 07/31/2021   Procedure: RIGHT TOTAL HIP ARTHROPLASTY ANTERIOR APPROACH;  Surgeon: Vernetta Lonni GRADE, MD;  Location: [facility] ORS;  Service: Orthopedics;  Laterality: Right;   WOUND DEBRIDEMENT Left 09/11/2020   Procedure: DEBRIDEMENT OF LEFT TRANSMETATARSAL WOUND;  Surgeon: Magda Debby SAILOR, MD;  Location[facility] OR;  Service: Vascular;  Laterality: Left;   Social History:  reports that he quit smoking about 4 years ago. His smoking use included cigarettes. He started smoking about 45 years ago. He has a 20.5 pack-year smoking history. He has never used smokeless tobacco. He reports current alcohol  use of about 14.0 standard drinks of alcohol  per week. He reports current drug use.  Allergies[1]  Family History  Problem Relation Age of Onset   COPD Mother    Cataracts Mother    Brain cancer Father    Hypertension Brother     Prior to Admission medications  Medication Sig Start Date End Date Taking? Authorizing Provider  acetaminophen  (TYLENOL ) 500 MG tablet Take 1,000 mg by mouth every 6 (six) hours as needed for moderate pain.    [provider]  aspirin  EC 81 MG tablet Take 81 mg by mouth daily. Swallow whole.    [provider]  Berberine Chloride 500 MG CAPS Take 500 mg by mouth daily.    [provider]  carvedilol  (COREG ) 3.125 MG tablet Take 1 tablet (3.125 mg total) by mouth 2 (two) times daily. 08/15/24 11/13/24  Anner Alm ORN, MD  clopidogrel  (PLAVIX ) 75 MG tablet Take 75 mg by mouth daily.    [provider]  EPIPEN  2-PAK 0.3 MG/0.3ML SOAJ injection Inject 0.3 mg into the muscle as needed for anaphylaxis. 02/10/22   [provider]  gabapentin  (NEURONTIN ) 300 MG capsule Take 600 mg by mouth 2 (two) times daily.    [provider]  hydrochlorothiazide  (HYDRODIURIL ) 25 MG tablet Take 25 mg by mouth daily. 07/06/22    [provider]  methocarbamol  (ROBAXIN ) 500 MG tablet Take 1 tablet (500 mg total) by mouth every 6 (six) hours as needed for muscle spasms. 07/31/21   Vernetta Lonni GRADE, MD  Multiple Vitamin (MULTIVITAMIN WITH MINERALS) TABS tablet Take 1 tablet by mouth 3 (three) times a week.    [provider]  mupirocin  ointment (BACTROBAN ) 2 % Apply 1 Application topically 2 (two) times daily. 03/17/22   [provider]  olmesartan (BENICAR) 40 MG tablet Take 40 mg by mouth daily. 07/27/22   [provider]  rosuvastatin  (CRESTOR ) 20 MG tablet Take 20 mg by mouth at bedtime. 04/23/21   [provider]  tiZANidine (ZANAFLEX) 2 MG tablet Take 2 mg by mouth at bedtime as needed for muscle spasms.    [provider]    Physical Exam: Vitals:   10/08/24 1632  BP: 134/87  Pulse: 82  Resp: 18  Temp: 99 F (37.2 C)  SpO2: 96%   Constitutional: Acutely ill looking NAD, calm, comfortable  Eyes: PERRL, lids and conjunctivae normal ENMT: Mucous membranes are moist. Posterior pharynx clear of any exudate or lesions.Normal dentition.  Neck: normal, supple, no masses, no thyromegaly Respiratory: clear to auscultation bilaterally, no wheezing, no crackles. Normal respiratory effort. No accessory muscle use.  Cardiovascular: Regular rate and rhythm, no murmurs / rubs / gallops. No extremity edema. 2+ pedal pulses. No carotid bruits.  Abdomen: no tenderness, no masses palpated. No hepatosplenomegaly. Bowel sounds positive.  Musculoskeletal: Good range of motion, left BKA stump is red, raised, warm no visible discharge  Skin: no rashes, lesions, ulcers. No induration Neurologic: CN 2-12 grossly intact. Sensation intact, DTR normal. Strength 5/5 in all 4.  Psychiatric: Normal judgment and insight. Alert and oriented x 3. Normal mood  Data Reviewed:  Temperature 99, the rest of the vitals are within normal.  White count 16.2 sodium 133 potassium 3.9.   Chloride is 94.  Glucose 139.  X-ray of the left lower extremity showed status post BKA with soft tissue swelling of the distal stump no emphysema.  There is erosive change and lucency at the margins of the tibia raising concern for early changes of osteomyelitis.  Assessment and Plan:  #1 cellulitis of possible osteomyelitis of the left lower extremity: Patient has what appears to be infection of his stump.  He we will get MRI to further characterize and see if he has osteomyelitis.  Initiate Vanco and cefepime .  Blood cultures obtained.  Vascular surgery to follow as well as orthopedics.  #2 type 2 diabetes: Non-insulin -dependent.  Sliding scale insulin  will be initiated and maintained.  #3 peripheral vascular disease: Confirm and continue home regimen  #4 hyperlipidemia: Continue statin  #5 chronic kidney disease stage IIIa: Appears to be at baseline.  Continue to monitor    Advance Care Planning:   Code Status: Prior full code  Consults: Dr. Harden orthopedics and vascular surgery  Family Communication: No family at bedside  Severity of Illness: The appropriate patient status for this patient is INPATIENT. Inpatient status is judged to be reasonable and necessary in order to provide the required intensity of service to ensure the patient's safety. The patient's presenting symptoms, physical exam findings, and initial radiographic and laboratory data in the context of their chronic comorbidities is felt to place them at high risk for further clinical deterioration. Furthermore, it is not anticipated that the patient will be medically stable for discharge from the hospital within 2 midnights of admission.   * I certify that at the point of admission it is my clinical judgment that the patient will require inpatient hospital care spanning beyond 2 midnights from the point of admission due to high intensity of service, high risk for further deterioration and high frequency of surveillance  required.*  AuthorBETHA SIM KNOLL, MD 10/08/2024 6:42 PM  For on call review [email or web address].      [1]  Allergies Allergen Reactions   Meloxicam  Nausea Only    Upset stomach    "

## 2024-10-08 NOTE — ED Triage Notes (Signed)
 Patient sent from UC for eval of possible osteomyelitis to LLE. Previous amputation. Redness, intermittent swelling/pain over the last week. Taking Tramadol  and oxycodone  for pain with some relief.

## 2024-10-08 NOTE — ED Provider Notes (Signed)
 " [facility]    CSN: [id number] Arrival date & time: 10/08/24  1359      History   Chief Complaint No chief complaint on file.   HPI Anthony Park is a 64 y.o. male.   Patient was in today with a 2-day history of worsening pain and redness of his left stump.  Reports that 4 to 5 years ago he had below the knee amputation on the left secondary to peripheral vascular disease and diabetes.  He has not had any more recent procedures but was developing discomfort after using his prosthetic and ambulating more than normal a few days before symptoms began.  He has taken leftover oxycodone  as well as tramadol  which has provided only temporary relief of symptoms.  When he woke up today the pain has worsened and is currently rated 12 on a 0-10 pain scale, described as throbbing, no alleviating factors identified.  He denies any fever but is experiencing nausea and vomiting; had an episode of emesis during evaluation but he attributes this to the narcotic pain medication.  Denies any recent antibiotics.  He is followed closely by vein and vascular as well as wound care specialists but has not seen them since symptoms began.  He denies any recent hospitalization, surgical procedure, immobilization, other risk factors for VTE event.    Past Medical History:  Diagnosis Date   AAA (abdominal aortic aneurysm)    Arthritis    Diabetes mellitus without complication (HCC)    type 2 controlled with diet    Diabetic toe ulcer (HCC) 11/05/2018   History of kidney stones    passed 1 several years ago   Hyperlipidemia    Hypertension    not on medications   Peripheral arterial disease    Peripheral vascular disease    stent in left leg due to anerysm     Patient Active Problem List   Diagnosis Date Noted   Diastolic dysfunction without heart failure 08/18/2024   Thoracic aortic aneurysm 08/15/2024   Coronary artery calcification seen on computed tomography 08/15/2024   Senile calcific  aortic valve sclerosis 08/15/2024   Osteomyelitis of great toe of right foot (HCC) 01/20/2024   Osteomyelitis of second toe of right foot (HCC) 11/02/2023   Hypogonadism, male 01/19/2023   Erectile dysfunction 01/19/2023   Aneurysm of abdominal vessel 01/19/2023   Chronic kidney disease, stage 3a (HCC) 01/19/2023   AAA (abdominal aortic aneurysm) without rupture 01/18/2023   S/P BKA (below knee amputation) unilateral, left (HCC) 10/03/2020   Non-healing wound of lower extremity 10/03/2020   History of amputation of left foot through metatarsal bone (HCC) 09/11/2020   PAD (peripheral artery disease) 08/20/2020   Status post total replacement of left hip 06/05/2020   Unilateral primary osteoarthritis, right hip 04/17/2020   Unilateral primary osteoarthritis, left hip 04/17/2020   Amputated toe, right 11/22/2018   Type 2 diabetes mellitus with foot ulcer, without long-term current use of insulin  (HCC) 11/22/2018   Mixed hyperlipidemia 11/22/2018   Aneurysm of left popliteal artery 11/22/2018   Status post peripheral artery angioplasty with insertion of stent 11/22/2018   Osteomyelitis of third toe of right foot (HCC)    Cellulitis of third toe of right foot     Past Surgical History:  Procedure Laterality Date    Left Transmetatarsal Ampuation (Left Foot)  08/20/2020   ABDOMINAL AORTOGRAM W/LOWER EXTREMITY N/A 08/06/2020   Procedure: ABDOMINAL AORTOGRAM W/LOWER EXTREMITY;  Surgeon: Gretta Lonni PARAS, MD;  Location: [facility]  INVASIVE CV LAB;  Service: Cardiovascular;  Laterality: N/A;   ABDOMINAL AORTOGRAM W/LOWER EXTREMITY N/A 10/27/2023   Procedure: ABDOMINAL AORTOGRAM W/LOWER EXTREMITY;  Surgeon: Gretta Lonni PARAS, MD;  Location: [facility];  Service: Cardiovascular;  Laterality: N/A;   AMPUTATION Right 11/07/2018   Procedure: RIGHT 3RD TOE AMPUTATION;  Surgeon: Harden Jerona GAILS, MD;  Location: [facility] OR;  Service: Orthopedics;  Laterality: Right;   AMPUTATION Left 08/20/2020    Procedure: Left Transmetatarsal Ampuation;  Surgeon: Gretta Lonni PARAS, MD;  Location: [facility] OR;  Service: Vascular;  Laterality: Left;   AMPUTATION Left 10/03/2020   Procedure: LEFT BELOW KNEE AMPUTATION;  Surgeon: Gretta Lonni PARAS, MD;  Location: [facility] OR;  Service: Vascular;  Laterality: Left;   AMPUTATION Right 11/02/2023   Procedure: RIGHT 2ND TOE AMPUTATION;  Surgeon: Harden Jerona GAILS, MD;  Location: [facility] OR;  Service: Orthopedics;  Laterality: Right;   AMPUTATION TOE Left    big toe and one next to it   AMPUTATION TOE Right 01/20/2024   Procedure: RIGHT GREAT TOE AMPUTATION;  Surgeon: Harden Jerona GAILS, MD;  Location: [facility] OR;  Service: Orthopedics;  Laterality: Right;  RIGHT GREAT TOE AMPUTATION   CATARACT EXTRACTION Bilateral    EYE SURGERY Bilateral    cataracts removed   INSERTION OF ILIAC STENT Left    PERIPHERAL VASCULAR BALLOON ANGIOPLASTY Left 08/06/2020   Procedure: PERIPHERAL VASCULAR BALLOON ANGIOPLASTY;  Surgeon: Gretta Lonni PARAS, MD;  Location: [facility];  Service: Cardiovascular;  Laterality: Left;  Peroneal artery.   PERIPHERAL VASCULAR BALLOON ANGIOPLASTY  10/27/2023   Procedure: PERIPHERAL VASCULAR BALLOON ANGIOPLASTY;  Surgeon: Gretta Lonni PARAS, MD;  Location: [facility];  Service: Cardiovascular;;   TOE AMPUTATION Left 2015   TOTAL HIP ARTHROPLASTY Left 05/23/2020   Procedure: LEFT TOTAL HIP ARTHROPLASTY ANTERIOR APPROACH;  Surgeon: Vernetta Lonni GRADE, MD;  Location: [facility] ORS;  Service: Orthopedics;  Laterality: Left;   TOTAL HIP ARTHROPLASTY Right 07/31/2021   Procedure: RIGHT TOTAL HIP ARTHROPLASTY ANTERIOR APPROACH;  Surgeon: Vernetta Lonni GRADE, MD;  Location: [facility] ORS;  Service: Orthopedics;  Laterality: Right;   WOUND DEBRIDEMENT Left 09/11/2020   Procedure: DEBRIDEMENT OF LEFT TRANSMETATARSAL WOUND;  Surgeon: Magda Debby SAILOR, MD;  Location: [facility] OR;  Service: Vascular;  Laterality: Left;       Home Medications    Prior to Admission  medications  Medication Sig Start Date End Date Taking? Authorizing Provider  acetaminophen  (TYLENOL ) 500 MG tablet Take 1,000 mg by mouth every 6 (six) hours as needed for moderate pain.    [provider]  aspirin  EC 81 MG tablet Take 81 mg by mouth daily. Swallow whole.    [provider]  Berberine Chloride 500 MG CAPS Take 500 mg by mouth daily.    [provider]  carvedilol  (COREG ) 3.125 MG tablet Take 1 tablet (3.125 mg total) by mouth 2 (two) times daily. 08/15/24 11/13/24  Anner Alm ORN, MD  clopidogrel  (PLAVIX ) 75 MG tablet Take 75 mg by mouth daily.    [provider]  EPIPEN  2-PAK 0.3 MG/0.3ML SOAJ injection Inject 0.3 mg into the muscle as needed for anaphylaxis. 02/10/22   [provider]  gabapentin  (NEURONTIN ) 300 MG capsule Take 600 mg by mouth 2 (two) times daily.    [provider]  hydrochlorothiazide  (HYDRODIURIL ) 25 MG tablet Take 25 mg by mouth daily. 07/06/22   [provider]  methocarbamol  (ROBAXIN ) 500 MG tablet Take 1 tablet (500 mg total)  by mouth every 6 (six) hours as needed for muscle spasms. 07/31/21   Vernetta Lonni GRADE, MD  Multiple Vitamin (MULTIVITAMIN WITH MINERALS) TABS tablet Take 1 tablet by mouth 3 (three) times a week.    [provider]  mupirocin  ointment (BACTROBAN ) 2 % Apply 1 Application topically 2 (two) times daily. 03/17/22   [provider]  olmesartan (BENICAR) 40 MG tablet Take 40 mg by mouth daily. 07/27/22   [provider]  rosuvastatin  (CRESTOR ) 20 MG tablet Take 20 mg by mouth at bedtime. 04/23/21   [provider]  tiZANidine (ZANAFLEX) 2 MG tablet Take 2 mg by mouth at bedtime as needed for muscle spasms.    [provider]    Family History Family History  Problem Relation Age of Onset   COPD Mother    Cataracts Mother    Brain cancer Father    Hypertension Brother     Social History Social History[1]   Allergies    Meloxicam    Review of Systems Review of Systems  Constitutional:  Positive for activity change. Negative for appetite change, fatigue and fever.  Respiratory:  Negative for shortness of breath.   Cardiovascular:  Negative for chest pain and leg swelling.  Gastrointestinal:  Positive for nausea and vomiting. Negative for abdominal pain and diarrhea.  Musculoskeletal:  Positive for arthralgias. Negative for myalgias.  Skin:  Positive for color change. Negative for wound.     Physical Exam Triage Vital Signs ED Triage Vitals  Encounter Vitals Group     BP 10/08/24 1446 113/70     Girls Systolic BP Percentile --      Girls Diastolic BP Percentile --      Boys Systolic BP Percentile --      Boys Diastolic BP Percentile --      Pulse Rate 10/08/24 1446 80     Resp 10/08/24 1446 18     Temp 10/08/24 1446 98.5 F (36.9 C)     Temp Source 10/08/24 1446 Oral     SpO2 10/08/24 1446 98 %     Weight --      Height --      Head Circumference --      Peak Flow --      Pain Score 10/08/24 1441 10     Pain Loc --      Pain Education --      Exclude from Growth Chart --    No data found.  Updated Vital Signs BP 113/70   Pulse 80   Temp 98.5 F (36.9 C) (Oral)   Resp 18   SpO2 98%   Visual Acuity Right Eye Distance:   Left Eye Distance:   Bilateral Distance:    Right Eye Near:   Left Eye Near:    Bilateral Near:     Physical Exam Vitals reviewed.  Constitutional:      General: He is awake.     Appearance: Normal appearance. He is well-developed. He is not ill-appearing.     Comments: Very pleasant male appears stated age in no acute distress sitting comfortably in exam room  HENT:     Head: Normocephalic and atraumatic.     Mouth/Throat:     Pharynx: Uvula midline. No oropharyngeal exudate or posterior oropharyngeal erythema.  Cardiovascular:     Comments: No pitting edema on the left.  BKA on left. Pulmonary:     Effort: Pulmonary effort is normal. No tachypnea  or prolonged expiration.  Abdominal:  General: Bowel sounds are normal.     Palpations: Abdomen is soft.     Tenderness: There is no abdominal tenderness.  Musculoskeletal:     Left Lower Extremity: Left leg is amputated below knee.  Skin:    Findings: Erythema present.     Comments: Erythema and significant tenderness palpation noted medial left lower leg along while here surgical scar.  Neurological:     Mental Status: He is alert.  Psychiatric:        Behavior: Behavior is cooperative.      UC Treatments / Results  Labs (all labs ordered are listed, but only abnormal results are displayed) Labs Reviewed  COMPREHENSIVE METABOLIC PANEL WITH GFR  CBC WITH DIFFERENTIAL/PLATELET    EKG   Radiology DG Tibia/Fibula Left Result Date: 10/08/2024 CLINICAL DATA:  Leg pain stump area swelling EXAM: LEFT TIBIA AND FIBULA - 2 VIEW COMPARISON:  None Available. FINDINGS: Partially visualized vascular stent with calcifications. No fracture. Status post below the knee amputation. Soft tissue swelling of the distal stump without emphysema. Possible subtle/early erosive change and lucency at the cut margin of the tibia and the distal anterior cortex of the tibia. IMPRESSION: Status post below the knee amputation. Soft tissue swelling of the distal stump without emphysema. Possible subtle/early erosive change and lucency at the cut margin of the tibia and the distal anterior cortex of the tibia, raising concern for early changes of osteomyelitis. Electronically Signed   By: Luke Bun M.D.   On: 10/08/2024 15:52    Procedures Procedures (including critical care time)  Medications Ordered in UC Medications  ondansetron  (ZOFRAN -ODT) disintegrating tablet 4 mg (4 mg Oral Given 10/08/24 1543)    Initial Impression / Assessment and Plan / UC Course  I have reviewed the triage vital signs and the nursing notes.  Pertinent labs & imaging results that were available during my care of the  patient were reviewed by me and considered in my medical decision making (see chart for details).     Patient is well-appearing, afebrile, nontoxic, nontachycardic.  X-ray was obtained that did not show possible erosive changes concerning for osteomyelitis so recommended that he go to the emergency room for further evaluation and management including potential CT since we do not have stat imaging capabilities in urgent care.  Patient was agreeable and will go directly to [facility], ER for further evaluation and management.  He was stable at the time of discharge and safe for private transport.  Final Clinical Impressions(s) / UC Diagnoses   Final diagnoses:  Pain of left lower extremity  Osteomyelitis of left tibia, unspecified type (HCC)  S/P BKA (below knee amputation) unilateral, left [facility])     Discharge Instructions      Please go to the emergency room for further evaluation and management as we discussed.     ED Prescriptions   None    PDMP not reviewed this encounter.    [1]  Social History Tobacco Use   Smoking status: Former    Current packs/day: 0.00    Average packs/day: 0.5 packs/day for 41.0 years (20.5 ttl pk-yrs)    Types: Cigarettes    Start date: 07/21/1979    Quit date: 07/20/2020    Years since quitting: 4.2   Smokeless tobacco: Never  Vaping Use   Vaping status: Never Used  Substance Use Topics   Alcohol  use: Yes    Alcohol /week: 14.0 standard drinks of alcohol     Types: 14 Standard drinks or equivalent  per week    Comment: daily   Drug use: Yes    Comment: marijuana     Sherrell Rocky POUR, PA-C 10/08/24 1626  "

## 2024-10-08 NOTE — Consult Note (Signed)
 " Hospital Consult    Reason for Consult: Concern for left BKA osteomyelitis Requesting Physician: Emergency department MRN #:  [id number]  History of Present Illness: This is a 64 y.o. male known to the vascular surgery service having previously undergone left-sided below-knee amputation in 2022.  Vascular surgery was called due to erythema and fluctuance and to the BKA stump site.  On exam, Anthony Park was sitting comfortably.  A native of [place] , he has lived in several places including overseas.  He has 3 children, and has been married to his second wife for 16 years.    He denied fevers chills.  Stated over the last 24 hours, he has appreciated swelling with erythema at the anterior aspect of his left-sided below-knee amputation stump.  States that is very painful to walk on.  Denies open wound, denies drainage  Past Medical History:  Diagnosis Date   AAA (abdominal aortic aneurysm)    Arthritis    Diabetes mellitus without complication (HCC)    type 2 controlled with diet    Diabetic toe ulcer (HCC) 11/05/2018   History of kidney stones    passed 1 several years ago   Hyperlipidemia    Hypertension    not on medications   Peripheral arterial disease    Peripheral vascular disease    stent in left leg due to anerysm     Past Surgical History:  Procedure Laterality Date    Left Transmetatarsal Ampuation (Left Foot)  08/20/2020   ABDOMINAL AORTOGRAM W/LOWER EXTREMITY N/A 08/06/2020   Procedure: ABDOMINAL AORTOGRAM W/LOWER EXTREMITY;  Surgeon: Gretta Lonni PARAS, MD;  [facility];  Service: Cardiovascular;  Laterality: N/A;   ABDOMINAL AORTOGRAM W/LOWER EXTREMITY N/A 10/27/2023   Procedure: ABDOMINAL AORTOGRAM W/LOWER EXTREMITY;  Surgeon: Gretta Lonni PARAS, MD;  Location: [facility];  Service: Cardiovascular;  Laterality: N/A;   AMPUTATION Right 11/07/2018   Procedure: RIGHT 3RD TOE AMPUTATION;  Surgeon: Harden Jerona GAILS, MD;  Location: [facility] OR;  Service:  Orthopedics;  Laterality: Right;   AMPUTATION Left 08/20/2020   Procedure: Left Transmetatarsal Ampuation;  Surgeon: Gretta Lonni PARAS, MD;  Location: [facility] OR;  Service: Vascular;  Laterality: Left;   AMPUTATION Left 10/03/2020   Procedure: LEFT BELOW KNEE AMPUTATION;  Surgeon: Gretta Lonni PARAS, MD;  Location: [facility] OR;  Service: Vascular;  Laterality: Left;   AMPUTATION Right 11/02/2023   Procedure: RIGHT 2ND TOE AMPUTATION;  Surgeon: Harden Jerona GAILS, MD;  Location: [facility] OR;  Service: Orthopedics;  Laterality: Right;   AMPUTATION TOE Left    big toe and one next to it   AMPUTATION TOE Right 01/20/2024   Procedure: RIGHT GREAT TOE AMPUTATION;  Surgeon: Harden Jerona GAILS, MD;  Location: [facility] OR;  Service: Orthopedics;  Laterality: Right;  RIGHT GREAT TOE AMPUTATION   CATARACT EXTRACTION Bilateral    EYE SURGERY Bilateral    cataracts removed   INSERTION OF ILIAC STENT Left    PERIPHERAL VASCULAR BALLOON ANGIOPLASTY Left 08/06/2020   Procedure: PERIPHERAL VASCULAR BALLOON ANGIOPLASTY;  Surgeon: Gretta Lonni PARAS, MD;  Location: [facility];  Service: Cardiovascular;  Laterality: Left;  Peroneal artery.   PERIPHERAL VASCULAR BALLOON ANGIOPLASTY  10/27/2023   Procedure: PERIPHERAL VASCULAR BALLOON ANGIOPLASTY;  Surgeon: Gretta Lonni PARAS, MD;  Location: [facility];  Service: Cardiovascular;;   TOE AMPUTATION Left 2015   TOTAL HIP ARTHROPLASTY Left 05/23/2020   Procedure: LEFT TOTAL HIP ARTHROPLASTY ANTERIOR APPROACH;  Surgeon: Vernetta Lonni GRADE, MD;  Location: [facility] ORS;  Service: Orthopedics;  Laterality: Left;   TOTAL HIP ARTHROPLASTY Right 07/31/2021   Procedure: RIGHT TOTAL HIP ARTHROPLASTY ANTERIOR APPROACH;  Surgeon: Vernetta Lonni GRADE, MD;  Location: [facility] OR[facility];  Service: Orthopedics;  Laterality: Right;   WOUND DEBRIDEMENT Left 09/11/2020   Procedure: DEBRIDEMENT OF LEFT TRANSMETATARSAL WOUND;  Surgeon: Magda Debby SAILOR, MD;  Location: [facility] OR;  Service: Vascular;   Laterality: Left;    Allergies[1]  Prior to Admission medications  Medication Sig Start Date End Date Taking? Authorizing Provider  aspirin  EC 81 MG tablet Take 81 mg by mouth daily. Swallow whole.   Yes [provider]  carvedilol  (COREG ) 3.125 MG tablet Take 1 tablet (3.125 mg total) by mouth 2 (two) times daily. 08/15/24 11/13/24 Yes Anner Alm ORN, MD  clopidogrel  (PLAVIX ) 75 MG tablet Take 75 mg by mouth daily.   Yes [provider]  EPIPEN  2-PAK 0.3 MG/0.3ML SOAJ injection Inject 0.3 mg into the muscle as needed for anaphylaxis. 02/10/22  Yes [provider]  gabapentin  (NEURONTIN ) 300 MG capsule Take 300 mg by mouth daily as needed (pain).   Yes [provider]  hydrochlorothiazide  (HYDRODIURIL ) 25 MG tablet Take 25 mg by mouth daily. 07/06/22  Yes [provider]  methocarbamol  (ROBAXIN ) 500 MG tablet Take 1 tablet (500 mg total) by mouth every 6 (six) hours as needed for muscle spasms. 07/31/21  Yes Vernetta Lonni GRADE, MD  Multiple Vitamin (MULTIVITAMIN WITH MINERALS) TABS tablet Take 1 tablet by mouth 3 (three) times a week.   Yes [provider]  mupirocin  ointment (BACTROBAN ) 2 % Apply 1 Application topically daily as needed (itching). 03/17/22  Yes [provider]  olmesartan (BENICAR) 40 MG tablet Take 40 mg by mouth daily. 07/27/22  Yes [provider]  REPATHA SURECLICK 140 MG/ML SOAJ Inject 140 mg into the skin every 14 (fourteen) days. 08/22/24  Yes [provider]  rosuvastatin  (CRESTOR ) 20 MG tablet Take 20 mg by mouth at bedtime. 04/23/21  Yes [provider]  tiZANidine (ZANAFLEX) 2 MG tablet Take 2 mg by mouth at bedtime as needed for muscle spasms.   Yes [provider]  traMADol  (ULTRAM ) 50 MG tablet Take 50 mg by mouth every 6 (six) hours as needed for moderate pain (pain score 4-6). 10/05/24  Yes [provider]  acetaminophen  (TYLENOL ) 500 MG tablet Take 1,000 mg  by mouth every 6 (six) hours as needed for moderate pain. Patient not taking: Reported on 10/08/2024    [provider]  Berberine Chloride 500 MG CAPS Take 500 mg by mouth daily.    [provider]    Social History   Socioeconomic History   Marital status: Single    Spouse name: Reena   Number of children: 3   Years of education: 12   Highest education level: Not on file  Occupational History   Not on file  Tobacco Use   Smoking status: Former    Current packs/day: 0.00    Average packs/day: 0.5 packs/day for 41.0 years (20.5 ttl pk-yrs)    Types: Cigarettes    Start date: 07/21/1979    Quit date: 07/20/2020    Years since quitting: 4.2   Smokeless tobacco: Never  Vaping Use   Vaping status: Never Used  Substance and Sexual Activity   Alcohol  use: Yes    Alcohol /week: 14.0 standard drinks of alcohol     Types: 14 Standard drinks or equivalent per week    Comment:  daily   Drug use: Yes    Comment: marijuana   Sexual activity: Yes  Other Topics Concern   Not on file  Social History Narrative   Not on file   Social Drivers of Health   Tobacco Use: Medium Risk (10/08/2024)   Patient History    Smoking Tobacco Use: Former    Smokeless Tobacco Use: Never    Passive Exposure: Not on Actuary Strain: Not on file  Food Insecurity: Not on file  Transportation Needs: Not on file  Physical Activity: Not on file  Stress: Not on file  Social Connections: Not on file  Intimate Partner Violence: Not on file  Depression (PHQ2-9): Not on file  Alcohol  Screen: Not on file  Housing: Not on file  Utilities: Not on file  Health Literacy: Not on file   Family History  Problem Relation Age of Onset   COPD Mother    Cataracts Mother    Brain cancer Father    Hypertension Brother     ROS: Otherwise negative unless mentioned in HPI  Physical Examination  Vitals:   10/08/24 2125 10/08/24 2200  BP: 112/71 119/84  Pulse: 100 (!) 105  Resp:  20 18  Temp:  98.6 F (37 C)  SpO2: 97% 94%   Body mass index is 33.28 kg/m.  General:  WDWN in NAD Gait: Not observed HENT: WNL, normocephalic Pulmonary: normal non-labored breathing, without Rales, rhonchi,  wheezing Cardiac: regular, Abdomen:  soft, NT/ND, no masses Skin: without rashes Extremities: without ischemic changes, without Gangrene , without cellulitis; without open wounds;  Fluctuance appreciated in the anterior aspect of the below-knee amputation site with pain to palpation Musculoskeletal: no muscle wasting or atrophy  Neurologic: A&O X 3;  No focal weakness or paresthesias are detected; speech is fluent/normal Psychiatric:  The pt has Normal affect. Lymph:  Unremarkable  CBC    Component Value Date/Time   WBC 18.8 (H) 10/08/2024 2204   RBC 5.03 10/08/2024 2204   HGB 14.6 10/08/2024 2204   HCT 42.7 10/08/2024 2204   PLT 176 10/08/2024 2204   MCV 84.9 10/08/2024 2204   MCH 29.0 10/08/2024 2204   MCHC 34.2 10/08/2024 2204   RDW 13.1 10/08/2024 2204   LYMPHSABS 1.7 10/08/2024 1844   MONOABS 1.1 (H) 10/08/2024 1844   EOSABS 0.1 10/08/2024 1844   BASOSABS 0.1 10/08/2024 1844    BMET    Component Value Date/Time   NA 133 (L) 10/08/2024 1844   K 3.9 10/08/2024 1844   CL 94 (L) 10/08/2024 1844   CO2 22 10/08/2024 1844   GLUCOSE 139 (H) 10/08/2024 1844   BUN 21 10/08/2024 1844   BUN 14 02/13/2014 1044   CREATININE 1.07 10/08/2024 2204   CREATININE 0.75 09/23/2020 1058   CALCIUM  9.0 10/08/2024 1844   GFRNONAA >60 10/08/2024 2204   GFRNONAA 85.5 07/31/2024 1303   GFRAA >60 05/20/2020 0933   GFRAA >60 02/13/2014 1044    COAGS: Lab Results  Component Value Date   INR 1.1 10/03/2020   INR 1.09 11/06/2018      ASSESSMENT/PLAN: This is a 64 y.o. male with history of left-sided below-knee amputation, now with concern on x-ray for possible osteomyelitis.  He does not have a source for osteomyelitis, no open wounds, however there is fluctuance and a  fluid collection.  Plan we are to follow-up the MRI.  If patient needs revision, we will likely discussed with my colleague Dr. Harden in an effort to salvage  the below-knee amputation.   Fonda FORBES Rim MD [facility] [phone] 10/08/2024  11:11 PM     [1]  Allergies Allergen Reactions   Meloxicam  Nausea Only    Upset stomach    "

## 2024-10-08 NOTE — ED Triage Notes (Signed)
 Pt c/o pain in left stump area. Pt has swelling in the front tip of the stump. Pt has areas of reddness on the areax2d.

## 2024-10-09 DIAGNOSIS — M86162 Other acute osteomyelitis, left tibia and fibula: Secondary | ICD-10-CM | POA: Diagnosis not present

## 2024-10-09 DIAGNOSIS — L02416 Cutaneous abscess of left lower limb: Secondary | ICD-10-CM | POA: Diagnosis not present

## 2024-10-09 DIAGNOSIS — M869 Osteomyelitis, unspecified: Secondary | ICD-10-CM | POA: Diagnosis not present

## 2024-10-09 LAB — CBC
HCT: 40.2 % (ref 39.0–52.0)
Hemoglobin: 13.2 g/dL (ref 13.0–17.0)
MCH: 28.8 pg (ref 26.0–34.0)
MCHC: 32.8 g/dL (ref 30.0–36.0)
MCV: 87.6 fL (ref 80.0–100.0)
Platelets: 156 10*3/uL (ref 150–400)
RBC: 4.59 MIL/uL (ref 4.22–5.81)
RDW: 13.2 % (ref 11.5–15.5)
WBC: 17.3 10*3/uL — ABNORMAL HIGH (ref 4.0–10.5)
nRBC: 0 % (ref 0.0–0.2)

## 2024-10-09 LAB — COMPREHENSIVE METABOLIC PANEL WITH GFR
ALT: 26 U/L (ref 0–44)
AST: 26 U/L (ref 15–41)
Albumin: 4.1 g/dL (ref 3.5–5.0)
Alkaline Phosphatase: 49 U/L (ref 38–126)
Anion gap: 14 (ref 5–15)
BUN: 19 mg/dL (ref 8–23)
CO2: 23 mmol/L (ref 22–32)
Calcium: 8.9 mg/dL (ref 8.9–10.3)
Chloride: 96 mmol/L — ABNORMAL LOW (ref 98–111)
Creatinine, Ser: 1.07 mg/dL (ref 0.61–1.24)
GFR, Estimated: >60 mL/min
Glucose, Bld: 134 mg/dL — ABNORMAL HIGH (ref 70–99)
Potassium: 3.8 mmol/L (ref 3.5–5.1)
Sodium: 133 mmol/L — ABNORMAL LOW (ref 135–145)
Total Bilirubin: 0.8 mg/dL (ref 0.0–1.2)
Total Protein: 7 g/dL (ref 6.5–8.1)

## 2024-10-09 LAB — GLUCOSE, CAPILLARY
Glucose-Capillary: 142 mg/dL — ABNORMAL HIGH (ref 70–99)
Glucose-Capillary: 224 mg/dL — ABNORMAL HIGH (ref 70–99)
Glucose-Capillary: 176 mg/dL — ABNORMAL HIGH (ref 70–99)

## 2024-10-09 LAB — CBG MONITORING, ED
Glucose-Capillary: 195 mg/dL — ABNORMAL HIGH (ref 70–99)
Glucose-Capillary: 149 mg/dL — ABNORMAL HIGH (ref 70–99)

## 2024-10-09 MED ORDER — ASPIRIN 81 MG PO TBEC
81.0000 mg | DELAYED_RELEASE_TABLET | Freq: Every day | ORAL | Status: DC
  Administered 2024-10-09 – 2024-10-16 (×8): 81 mg via ORAL
  Filled 2024-10-09 (×8): qty 1

## 2024-10-09 MED ORDER — OXYCODONE HCL 5 MG PO TABS
5.0000 mg | ORAL_TABLET | Freq: Four times a day (QID) | ORAL | Status: DC | PRN
  Administered 2024-10-09 – 2024-10-12 (×8): 5 mg via ORAL
  Filled 2024-10-09 (×9): qty 1

## 2024-10-09 MED ORDER — ACETAMINOPHEN 500 MG PO TABS
1000.0000 mg | ORAL_TABLET | Freq: Three times a day (TID) | ORAL | Status: AC
  Administered 2024-10-09 – 2024-10-16 (×21): 1000 mg via ORAL
  Filled 2024-10-09 (×21): qty 2

## 2024-10-09 MED ORDER — METHOCARBAMOL 500 MG PO TABS
500.0000 mg | ORAL_TABLET | Freq: Four times a day (QID) | ORAL | Status: DC | PRN
  Administered 2024-10-09 – 2024-10-10 (×2): 500 mg via ORAL
  Filled 2024-10-09 (×3): qty 1

## 2024-10-09 NOTE — Plan of Care (Signed)
" °  Problem: Nutritional: Goal: Maintenance of adequate nutrition will improve Outcome: Progressing   Problem: Activity: Goal: Risk for activity intolerance will decrease Outcome: Progressing   Problem: Nutrition: Goal: Adequate nutrition will be maintained Outcome: Progressing   Problem: Elimination: Goal: Will not experience complications related to urinary retention Outcome: Progressing   "

## 2024-10-09 NOTE — ED Notes (Signed)
 PT declines wearing bp cuff and O2 sensor  at this time.

## 2024-10-09 NOTE — TOC Initial Note (Signed)
 Transition of Care [facility]) - Initial/Assessment Note    Patient Details  Name: Anthony Park MRN: [id number] Date of Birth: 1961/07/27  [facility]) CM/SW Contact:    Jeoffrey LITTIE Moose, LCSWA Phone Number: 10/09/2024, 11:23 AM  Clinical Narrative:                 Pt admitted from home w/ significant other due to pain in left stump area. No current ICM needs, please consult as needs arise.        Patient Goals and CMS Choice            Expected Discharge Plan and Services                                              Prior Living Arrangements/Services                       Activities of Daily Living      Permission Sought/Granted                  Emotional Assessment              Admission diagnosis:  Osteomyelitis of left tibia [facility]) [M86.9] Patient Active Problem List   Diagnosis Date Noted   Osteomyelitis of left tibia (HCC) 10/08/2024   Diastolic dysfunction without heart failure 08/18/2024   Thoracic aortic aneurysm 08/15/2024   Coronary artery calcification seen on computed tomography 08/15/2024   Senile calcific aortic valve sclerosis 08/15/2024   Osteomyelitis of great toe of right foot (HCC) 01/20/2024   Osteomyelitis of second toe of right foot (HCC) 11/02/2023   Hypogonadism, male 01/19/2023   Erectile dysfunction 01/19/2023   Aneurysm of abdominal vessel 01/19/2023   Chronic kidney disease, stage 3a (HCC) 01/19/2023   AAA (abdominal aortic aneurysm) without rupture 01/18/2023   S/P BKA (below knee amputation) unilateral, left (HCC) 10/03/2020   Non-healing wound of lower extremity 10/03/2020   History of amputation of left foot through metatarsal bone (HCC) 09/11/2020   PAD (peripheral artery disease) 08/20/2020   Status post total replacement of left hip 06/05/2020   Unilateral primary osteoarthritis, right hip 04/17/2020   Unilateral primary osteoarthritis, left hip 04/17/2020   Amputated toe, right  11/22/2018   Type 2 diabetes mellitus with foot ulcer, without long-term current use of insulin  (HCC) 11/22/2018   Mixed hyperlipidemia 11/22/2018   Aneurysm of left popliteal artery 11/22/2018   Status post peripheral artery angioplasty with insertion of stent 11/22/2018   Osteomyelitis of third toe of right foot (HCC)    Cellulitis of third toe of right foot    PCP:  Pcp, No Pharmacy:   [id number] [address], [place] - [address] [place] [address] Phone: [phone] Fax: [phone]  [facility] - [place], [place] - [address] [place] [address] Phone: [phone] Fax: [phone]     Social Drivers of Health (SDOH) Social History: SDOH Screenings   Tobacco Use: Medium Risk (10/08/2024)   SDOH Interventions:     Readmission Risk Interventions     No data to display

## 2024-10-09 NOTE — Consult Note (Signed)
 "  ORTHOPAEDIC CONSULTATION  REQUESTING PHYSICIAN: Sim Emery CROME, MD  Chief Complaint: Acute redness pain and swelling left below-knee amputation.  HPI: Anthony Park is a 64 y.o. male who presents with acute pain redness and swelling left BKA.  Patient states he recently has traveled out west he was in [place]  [place]  Patient states that he started developing pain redness and swelling yesterday and presented to the emergency department.  MRI scan was obtained and patient was started on antibiotics.  Past Medical History:  Diagnosis Date   AAA (abdominal aortic aneurysm)    Arthritis    Diabetes mellitus without complication (HCC)    type 2 controlled with diet    Diabetic toe ulcer (HCC) 11/05/2018   History of kidney stones    passed 1 several years ago   Hyperlipidemia    Hypertension    not on medications   Peripheral arterial disease    Peripheral vascular disease    stent in left leg due to anerysm    Past Surgical History:  Procedure Laterality Date    Left Transmetatarsal Ampuation (Left Foot)  08/20/2020   ABDOMINAL AORTOGRAM W/LOWER EXTREMITY N/A 08/06/2020   Procedure: ABDOMINAL AORTOGRAM W/LOWER EXTREMITY;  Surgeon: Gretta Lonni PARAS, MD;  Location: [facility];  Service: Cardiovascular;  Laterality: N/A;   ABDOMINAL AORTOGRAM W/LOWER EXTREMITY N/A 10/27/2023   Procedure: ABDOMINAL AORTOGRAM W/LOWER EXTREMITY;  Surgeon: Gretta Lonni PARAS, MD;  Location: [facility];  Service: Cardiovascular;  Laterality: N/A;   AMPUTATION Right 11/07/2018   Procedure: RIGHT 3RD TOE AMPUTATION;  Surgeon: Harden Jerona GAILS, MD;  Location: [facility] OR;  Service: Orthopedics;  Laterality: Right;   AMPUTATION Left 08/20/2020   Procedure: Left Transmetatarsal Ampuation;  Surgeon: Gretta Lonni PARAS, MD;  Location: [facility] OR;  Service: Vascular;  Laterality: Left;   AMPUTATION Left 10/03/2020   Procedure: LEFT BELOW KNEE AMPUTATION;   Surgeon: Gretta Lonni PARAS, MD;  Location: [facility] OR;  Service: Vascular;  Laterality: Left;   AMPUTATION Right 11/02/2023   Procedure: RIGHT 2ND TOE AMPUTATION;  Surgeon: Harden Jerona GAILS, MD;  Location: [facility] OR;  Service: Orthopedics;  Laterality: Right;   AMPUTATION TOE Left    big toe and one next to it   AMPUTATION TOE Right 01/20/2024   Procedure: RIGHT GREAT TOE AMPUTATION;  Surgeon: Harden Jerona GAILS, MD;  Location: [facility] OR;  Service: Orthopedics;  Laterality: Right;  RIGHT GREAT TOE AMPUTATION   CATARACT EXTRACTION Bilateral    EYE SURGERY Bilateral    cataracts removed   INSERTION OF ILIAC STENT Left    PERIPHERAL VASCULAR BALLOON ANGIOPLASTY Left 08/06/2020   Procedure: PERIPHERAL VASCULAR BALLOON ANGIOPLASTY;  Surgeon: Gretta Lonni PARAS, MD;  Location: [facility];  Service: Cardiovascular;  Laterality: Left;  Peroneal artery.   PERIPHERAL VASCULAR BALLOON ANGIOPLASTY  10/27/2023   Procedure: PERIPHERAL VASCULAR BALLOON ANGIOPLASTY;  Surgeon: Gretta Lonni PARAS, MD;  Location: [facility];  Service: Cardiovascular;;   TOE AMPUTATION Left 2015   TOTAL HIP ARTHROPLASTY Left 05/23/2020   Procedure: LEFT TOTAL HIP ARTHROPLASTY ANTERIOR APPROACH;  Surgeon: Vernetta Lonni GRADE, MD;  Location: [facility] ORS;  Service: Orthopedics;  Laterality: Left;   TOTAL HIP ARTHROPLASTY Right 07/31/2021   Procedure: RIGHT TOTAL HIP ARTHROPLASTY ANTERIOR APPROACH;  Surgeon: Vernetta Lonni GRADE, MD;  Location: [facility] OR[facility];  Service: Orthopedics;  Laterality: Right;   WOUND DEBRIDEMENT Left 09/11/2020   Procedure: DEBRIDEMENT OF LEFT  TRANSMETATARSAL WOUND;  Surgeon: Magda Debby SAILOR, MD;  Location: [facility] OR;  Service: Vascular;  Laterality: Left;   Social History   Socioeconomic History   Marital status: Single    Spouse name: Reena   Number of children: 3   Years of education: 12   Highest education level: Not on file  Occupational History   Not on file  Tobacco Use   Smoking status: Former     Current packs/day: 0.00    Average packs/day: 0.5 packs/day for 41.0 years (20.5 ttl pk-yrs)    Types: Cigarettes    Start date: 07/21/1979    Quit date: 07/20/2020    Years since quitting: 4.2   Smokeless tobacco: Never  Vaping Use   Vaping status: Never Used  Substance and Sexual Activity   Alcohol  use: Yes    Alcohol /week: 14.0 standard drinks of alcohol     Types: 14 Standard drinks or equivalent per week    Comment: daily   Drug use: Yes    Comment: marijuana   Sexual activity: Yes  Other Topics Concern   Not on file  Social History Narrative   Not on file   Social Drivers of Health   Tobacco Use: Medium Risk (10/08/2024)   Patient History    Smoking Tobacco Use: Former    Smokeless Tobacco Use: Never    Passive Exposure: Not on Actuary Strain: Not on file  Food Insecurity: Not on file  Transportation Needs: Not on file  Physical Activity: Not on file  Stress: Not on file  Social Connections: Not on file  Depression (PHQ2-9): Not on file  Alcohol  Screen: Not on file  Housing: Not on file  Utilities: Not on file  Health Literacy: Not on file   Family History  Problem Relation Age of Onset   COPD Mother    Cataracts Mother    Brain cancer Father    Hypertension Brother    - negative except otherwise stated in the family history section Allergies[1] Prior to Admission medications  Medication Sig Start Date End Date Taking? Authorizing Provider  aspirin  EC 81 MG tablet Take 81 mg by mouth daily. Swallow whole.   Yes [provider]  carvedilol  (COREG ) 3.125 MG tablet Take 1 tablet (3.125 mg total) by mouth 2 (two) times daily. 08/15/24 11/13/24 Yes Anner Alm ORN, MD  clopidogrel  (PLAVIX ) 75 MG tablet Take 75 mg by mouth daily.   Yes [provider]  EPIPEN  2-PAK 0.3 MG/0.3ML SOAJ injection Inject 0.3 mg into the muscle as needed for anaphylaxis. 02/10/22  Yes [provider]  gabapentin  (NEURONTIN ) 300 MG capsule Take  300 mg by mouth daily as needed (pain).   Yes [provider]  hydrochlorothiazide  (HYDRODIURIL ) 25 MG tablet Take 25 mg by mouth daily. 07/06/22  Yes [provider]  methocarbamol  (ROBAXIN ) 500 MG tablet Take 1 tablet (500 mg total) by mouth every 6 (six) hours as needed for muscle spasms. 07/31/21  Yes Vernetta Lonni GRADE, MD  Multiple Vitamin (MULTIVITAMIN WITH MINERALS) TABS tablet Take 1 tablet by mouth 3 (three) times a week.   Yes [provider]  mupirocin  ointment (BACTROBAN ) 2 % Apply 1 Application topically daily as needed (itching). 03/17/22  Yes [provider]  olmesartan (BENICAR) 40 MG tablet Take 40 mg by mouth daily. 07/27/22  Yes [provider]  REPATHA SURECLICK 140 MG/ML SOAJ Inject 140 mg into the skin every 14 (fourteen) days. 08/22/24  Yes [provider]  rosuvastatin  (CRESTOR ) 20 MG tablet Take 20 mg by mouth at bedtime. 04/23/21  Yes [provider]  tiZANidine (ZANAFLEX) 2 MG tablet Take 2 mg by mouth at bedtime as needed for muscle spasms.   Yes [provider]  traMADol  (ULTRAM ) 50 MG tablet Take 50 mg by mouth every 6 (six) hours as needed for moderate pain (pain score 4-6). 10/05/24  Yes [provider]  acetaminophen  (TYLENOL ) 500 MG tablet Take 1,000 mg by mouth every 6 (six) hours as needed for moderate pain. Patient not taking: Reported on 10/08/2024    [provider]  Berberine Chloride 500 MG CAPS Take 500 mg by mouth daily.    [provider]   MR TIBIA FIBULA LEFT W WO CONTRAST Result Date: 10/09/2024 EXAM: MRI OF THE LEFT TIBIA/FIBULA WITH AND WITHOUT INTRAVENOUS CONTRAST 10/08/2024 08:48:37 PM TECHNIQUE: Multiplanar magnetic resonance images of the left tibia/fibula with and without intravenous contrast. Contrast: 10 cc Gadavist  (gadobutrol  (GADAVIST ) 1 MMOL/ML injection 10 mL GADOBUTROL  1 MMOL/ML IV SOLN). COMPARISON: Radiographs 10/08/2024. CLINICAL HISTORY:  Pain and swelling along amputation site of below the knee amputation, possible osteomyelitis on radiography. FINDINGS: SOFT TISSUES: Around the distal anterior amputation margin of the tibia and extending primarily in the subcutaneous tissues, there is a 4.0 x 2.6 x 3.4 cm fluid collection with enhancement in the subcutaneous tissues surrounding this fluid collection, as shown for example on image 23 series 18. No gas is present within the collection. The collection abuts the anterior periosteal margin of the distal tibia rim with associated mild focal periostitis (as shown on conventional radiography), mild cortical enhancement, and trace underlying marrow edema and enhancement, as shown on image 17 series 10. While the findings in the distal tibia could possibly be reactive, early osteomyelitis adjacent to a subcutaneous abscess is a distinct possibility. Correlate with any clinical signs of infection. Subcutaneous edema is noted along the distal stump margin. JOINTS: Unremarkable. No dislocation or significant effusion. BONES: Mild focal periostitis of the distal tibia rim (as shown on conventional radiography), mild cortical enhancement, and trace underlying marrow edema and enhancement, as shown on image 17 series 10. While these findings could possibly be reactive, early osteomyelitis adjacent to a subcutaneous abscess is a distinct possibility. Correlate with any clinical signs of infection. IMPRESSION: 1. Subcutaneous collection with surrounding enhancement and edema at the distal anterior amputation margin, possible abscess. Adjacent distal tibial periostitis and mild cortical and marrow enhancement, suspicious for early osteomyelitis. 2. Subcutaneous edema along the distal stump margin. Electronically signed by: Ryan Salvage MD 10/09/2024 07:37 AM EST RP Workstation: [id number]   DG Tibia/Fibula Left Result Date: 10/08/2024 CLINICAL DATA:  Leg pain stump area swelling EXAM: LEFT TIBIA AND FIBULA -  2 VIEW COMPARISON:  None Available. FINDINGS: Partially visualized vascular stent with calcifications. No fracture. Status post below the knee amputation. Soft tissue swelling of the distal stump without emphysema. Possible subtle/early erosive change and lucency at the cut margin of the tibia and the distal anterior cortex of the tibia. IMPRESSION: Status post below the knee amputation. Soft tissue swelling of the distal stump without emphysema. Possible subtle/early erosive change and lucency at the cut margin of the tibia and the distal anterior cortex of the tibia, raising concern for early changes of osteomyelitis. Electronically Signed   By: Luke Bun M.D.   On: 10/08/2024 15:52   - pertinent xrays, CT, MRI studies were reviewed and independently interpreted  Positive ROS: All other systems have been reviewed  and were otherwise negative with the exception of those mentioned in the HPI and as above.  Physical Exam: General: Alert, no acute distress Psychiatric: Patient is competent for consent with normal mood and affect Lymphatic: No axillary or cervical lymphadenopathy Cardiovascular: No pedal edema Respiratory: No cyanosis, no use of accessory musculature GI: No organomegaly, abdomen is soft and non-tender    Images:  @ENCIMAGES @  Labs:  Lab Results  Component Value Date   HGBA1C 6.9 (H) 10/08/2024   HGBA1C 6.5 (H) 07/24/2021   HGBA1C 6.2 (H) 10/03/2020   ESRSEDRATE 3 10/08/2024   CRP 1.2 (H) 10/08/2024   CRP 3.8 (H) 10/03/2020   CRP 53.7 (H) 09/23/2020   LABURIC 6.9 07/29/2020   REPTSTATUS PENDING 10/08/2024   GRAMSTAIN  09/11/2020    RARE WBC PRESENT, PREDOMINANTLY MONONUCLEAR MODERATE GRAM NEGATIVE RODS FEW GRAM VARIABLE ROD    CULT  10/08/2024    NO GROWTH < 24 HOURS Performed at [facility], [address] [place], [place] [address]    LABORGA PSEUDOMONAS AERUGINOSA 09/11/2020   LABORGA ESCHERICHIA COLI 09/11/2020    Lab Results  Component Value  Date   ALBUMIN  4.1 10/09/2024   ALBUMIN  4.4 10/08/2024   ALBUMIN  4.4 10/08/2024   LABURIC 6.9 07/29/2020        Latest Ref Rng & Units 10/09/2024    5:00 AM 10/08/2024   10:04 PM 10/08/2024    6:44 PM  CBC EXTENDED  WBC 4.0 - 10.5 K/uL 17.3  18.8  16.2   RBC 4.22 - 5.81 MIL/uL 4.59  5.03  4.70   Hemoglobin 13.0 - 17.0 g/dL 86.7  85.3  86.1   HCT 39.0 - 52.0 % 40.2  42.7  39.8   Platelets 150 - 400 K/uL 156  176  164   NEUT# 1.7 - 7.7 K/uL   13.2   Lymph# 0.7 - 4.0 K/uL   1.7     Neurologic: Patient does not have protective sensation bilateral lower extremities.   MUSCULOSKELETAL:   Skin: Examination patient has swelling and cellulitis of the residual limb left below-knee amputation.  This is tender to palpation there is some surrounding cellulitis.  There are no open wounds no ulcers.  Review of the MRI scan does show a large fluid collection and does show some edema in the distal tibia.  Patient has radiographs which shows some irregular edges along the distal tibia consistent with osteomyelitis.  After informed consent I sterilely prepped his below-knee amputation.  The skin was locally anesthetized with 2 cc of 1% lidocaine  plain.  An 18-gauge needle was inserted into the fluid collection and 15 cc of purulent material was drained.  White cell count 17.3.  Hemoglobin A1c 6.9 C-reactive protein 1.2.  Assessment: Assessment acute abscess left below-knee amputation with bone involvement.  Plan: Plan: I discussed this with Dr. Lanis and feel that patient does need a revision of the below-knee amputation.  Excision of the abscess tissue excision of the distal centimeter of the tibia and sending the tissue for cultures.  Anticipate patient will need short-term course of antibiotics.  Thank you for the consult and the opportunity to see Mr. Almetta Jerona Sage, MD [facility] [phone] 2:51 PM         [1]  Allergies Allergen Reactions   Meloxicam   Nausea Only    Upset stomach    "

## 2024-10-09 NOTE — Progress Notes (Addendum)
 " PROGRESS NOTE  Anthony Park  DOB: August 27, 1961  PCP: Pcp, No FMW:[id number]  DOA: 10/08/2024  LOS: 1 day  Hospital Day: 2  Subjective: Patient was seen and examined this morning. Pleasant middle-aged Caucasian male.  Lying down on ED gurney. Pain controlled partially.  Family not at bedside Remains afebrile, hemodynamically stable. Repeat labs this morning showed WBC count at 17.3  Brief narrative: Anthony Park is a 64 y.o. male with PMH significant for DM2, HTN, HLD, AAA, diabetic foot, PAD s/p left BKA, right foot toes amputations, arthritis. 1/26-patient presented to ED with complaint of pain in the left BKA stump, associated with swelling, redness.  He had left BKA done in 2021 by vascular surgery for osteomyelitis.  He did not have any issues with the stump since then until now.  In the ED, afebrile, hemodynamically stable. WC count 16.1, hemoglobin 14.3, sodium 133.  Left stump x-ray showed soft tissue swelling of the distal stump without emphysema. Possible subtle/early erosive change and lucency at the cut margin of the tibia and the distal anterior cortex of the tibia, raising concern for early changes of osteomyelitis. MRI tibia-fibula showed  -Subcutaneous collection with surrounding enhancement and edema at the distal anterior amputation margin, possible abscess. -Adjacent distal tibial periostitis and mild cortical and marrow enhancement, suspicious for early osteomyelitis. -Subcutaneous edema along the distal stump margin.  With the concern of osteomyelitis and abscess, blood culture was collected.  Started on broad-spectrum IV antibiotics Admitted to [facility] Vascular surgery and orthopedics were consulted  Assessment and plan: Left BKA stump cellulitis, osteomyelitis and abscess H/o prior left BKA 2021 Presented with pain, redness, swelling WBC count elevated but not septic X-ray and MRI as above Vascular surgery and orthopedics were consulted. I have sent  message to Dr. Welford. Harden this morning. Pain regimen --- Scheduled: Tylenol  1 g 3 times daily, gabapentin  600 mg twice daily --- PRN: IV morphine  2 mg every 2 hours, oral oxycodone  5 mg every 6 hours, Robaxin  500 mg 4 times daily  Irregular heartbeat On exam, he noticed irregular heart rate.  No known history of A-fib.  Possible PACs.  Obtain EKG  Chronic diastolic CHF Hypertension PTA meds- carvedilol  3.125 mg twice daily, olmesartan 40 mg daily, HCTZ 12 mg daily Continue all. Most recent echo from 06/2024 with EF 55 to 60%, mild LVH, moderately dilated LV, grade 2 diastolic dysfunction, mildly enlarged RV, biatrial dilatation, mild MR aortic valve sclerosis, aortic root dilatation  PAD, AAA HLD Continue aspirin , Crestor .  Keep Plavix  on hold.  Also on Repatha as an outpatient Follows up with vascular surgery Dr. Silver.  Type 2 diabetes mellitus Diabetic neuropathy A1c 6.9 on 10/08/2024 Diet controlled Currently on SSI/Accu-Cheks Gabapentin  as above Recent Labs  Lab 10/08/24 2142 10/09/24 0906  GLUCAP 144* 195*   CKD 2 Hyponatremia Creatinine remains normal. Sodium level slightly low between 130 and 135.  Continue to monitor Recent Labs  Lab 10/08/24 1652 10/08/24 1844 10/08/24 2204 10/09/24 0500  NA 133* 133*  --  133*  K 3.9 3.9  --  3.8  CL 95* 94*  --  96*  CO2 24 22  --  23  GLUCOSE 151* 139*  --  134*  BUN 21 21  --  19  CREATININE 1.08 1.00 1.07 1.07  CALCIUM  9.2 9.0  --  8.9     Nutrition Status:         Mobility: Was using prosthesis on left BKA.  May need  PT eval after procedure again  PT Orders:   PT Follow up Rec:     Goals of care   Code Status: Full Code     DVT prophylaxis:  enoxaparin  (LOVENOX ) injection 40 mg Start: 10/08/24 2000   Antimicrobials: IV vancomycin , cefepime  Fluid: None Consultants: Vascular surgery/orthopedics Family Communication: None at bedside  Status: Inpatient Level of care: MedSurg currently.   Upgrade to Telemetry   Patient is from: Home Needs to continue in-hospital care: Possible need surgery Anticipated d/c to: Pending clinical course   Diet:  Diet Order             Diet heart healthy/carb modified Room service appropriate? Yes; Fluid consistency: Thin  Diet effective now                   Scheduled Meds:  acetaminophen   1,000 mg Oral TID   aspirin  EC  81 mg Oral Daily   carvedilol   3.125 mg Oral BID   enoxaparin  (LOVENOX ) injection  40 mg Subcutaneous Q24H   gabapentin   600 mg Oral BID   hydrochlorothiazide   25 mg Oral Daily   insulin  aspart  0-15 Units Subcutaneous TID WC   insulin  aspart  0-5 Units Subcutaneous QHS   irbesartan   300 mg Oral Daily   rosuvastatin   20 mg Oral QHS    PRN meds: hydrALAZINE , methocarbamol , morphine  injection, ondansetron  **OR** ondansetron  (ZOFRAN ) IV, oxyCODONE    Infusions:   ceFEPime  (MAXIPIME ) IV Stopped (10/09/24 0327)   lactated ringers  100 mL/hr at 10/09/24 0841   vancomycin  1,250 mg (10/09/24 0923)    Antimicrobials: Anti-infectives (From admission, onward)    Start     Dose/Rate Route Frequency Ordered Stop   10/09/24 1000  vancomycin  (VANCOREADY) IVPB 1250 mg/250 mL        1,250 mg 166.7 mL/hr over 90 Minutes Intravenous Every 12 hours 10/08/24 1823     10/08/24 1930  ceFEPIme  (MAXIPIME ) 2 g in sodium chloride  0.9 % 100 mL IVPB        2 g 200 mL/hr over 30 Minutes Intravenous Every 8 hours 10/08/24 1924     10/08/24 1830  Ampicillin -Sulbactam (UNASYN ) 3 g in sodium chloride  0.9 % 100 mL IVPB        3 g 200 mL/hr over 30 Minutes Intravenous  Once 10/08/24 1818 10/08/24 1855   10/08/24 1830  vancomycin  (VANCOREADY) IVPB 2000 mg/400 mL        2,000 mg 200 mL/hr over 120 Minutes Intravenous  Once 10/08/24 1819 10/09/24 0003       Objective: Vitals:   10/09/24 0332 10/09/24 0911  BP: 137/88 (!) 148/94  Pulse: 96 97  Resp: 20 16  Temp: 99.7 F (37.6 C) 99.6 F (37.6 C)  SpO2: 98% 97%     Intake/Output Summary (Last 24 hours) at 10/09/2024 1019 Last data filed at 10/09/2024 0706 Gross per 24 hour  Intake 1385.71 ml  Output --  Net 1385.71 ml   Filed Weights   10/08/24 2246  Weight: 111.3 kg   Weight change:  Body mass index is 33.28 kg/m.   Physical Exam: General exam: Pleasant, middle-aged Caucasian male. Skin: No rashes, lesions or ulcers. HEENT: Atraumatic, normocephalic, no obvious bleeding Lungs: Clear to auscultation bilaterally,  CVS: Irregular heart rate, mild systolic murmur GI/Abd: Soft, nontender, nondistended, bowel sound present,   CNS: Alert, awake, oriented x 3 Psychiatry: Mood appropriate Extremities: No pedal edema, no calf tenderness, left BKA stump with redness, tenderness and fluctuation  Data Review: I have personally reviewed the laboratory data and studies available.  F/u labs ordered Unresulted Labs (From admission, onward)     Start     Ordered   10/15/24 0500  Creatinine, serum  (enoxaparin  (LOVENOX )    CrCl >/= 30 ml/min)  Weekly,   R     Comments: while on enoxaparin  therapy    10/08/24 1906   10/10/24 0500  CBC with Differential/Platelet  Tomorrow morning,   R        10/09/24 1017   10/10/24 0500  Basic metabolic panel with GFR  Tomorrow morning,   R        10/09/24 1017            Signed, Chapman Rota, MD [facility] 10/09/2024  "

## 2024-10-10 ENCOUNTER — Inpatient Hospital Stay (HOSPITAL_COMMUNITY)

## 2024-10-10 ENCOUNTER — Encounter (HOSPITAL_COMMUNITY): Admission: EM | Disposition: A | Payer: Self-pay | Source: Home / Self Care

## 2024-10-10 DIAGNOSIS — I10 Essential (primary) hypertension: Secondary | ICD-10-CM

## 2024-10-10 DIAGNOSIS — M869 Osteomyelitis, unspecified: Secondary | ICD-10-CM | POA: Diagnosis not present

## 2024-10-10 DIAGNOSIS — I2510 Atherosclerotic heart disease of native coronary artery without angina pectoris: Secondary | ICD-10-CM | POA: Diagnosis not present

## 2024-10-10 DIAGNOSIS — T8744 Infection of amputation stump, left lower extremity: Secondary | ICD-10-CM | POA: Diagnosis not present

## 2024-10-10 DIAGNOSIS — B9562 Methicillin resistant Staphylococcus aureus infection as the cause of diseases classified elsewhere: Secondary | ICD-10-CM

## 2024-10-10 DIAGNOSIS — E119 Type 2 diabetes mellitus without complications: Secondary | ICD-10-CM

## 2024-10-10 LAB — CBC WITH DIFFERENTIAL/PLATELET
Abs Immature Granulocytes: 0.07 10*3/uL (ref 0.00–0.07)
Basophils Absolute: 0.1 10*3/uL (ref 0.0–0.1)
Basophils Relative: 1 %
Eosinophils Absolute: 0.1 10*3/uL (ref 0.0–0.5)
Eosinophils Relative: 1 %
HCT: 38.8 % — ABNORMAL LOW (ref 39.0–52.0)
Hemoglobin: 12.9 g/dL — ABNORMAL LOW (ref 13.0–17.0)
Immature Granulocytes: 1 %
Lymphocytes Relative: 21 %
Lymphs Abs: 2.6 10*3/uL (ref 0.7–4.0)
MCH: 29.1 pg (ref 26.0–34.0)
MCHC: 33.2 g/dL (ref 30.0–36.0)
MCV: 87.4 fL (ref 80.0–100.0)
Monocytes Absolute: 1.1 10*3/uL — ABNORMAL HIGH (ref 0.1–1.0)
Monocytes Relative: 9 %
Neutro Abs: 8.3 10*3/uL — ABNORMAL HIGH (ref 1.7–7.7)
Neutrophils Relative %: 67 %
Platelets: 142 10*3/uL — ABNORMAL LOW (ref 150–400)
RBC: 4.44 MIL/uL (ref 4.22–5.81)
RDW: 13.2 % (ref 11.5–15.5)
WBC: 12.2 10*3/uL — ABNORMAL HIGH (ref 4.0–10.5)
nRBC: 0 % (ref 0.0–0.2)

## 2024-10-10 LAB — GLUCOSE, CAPILLARY
Glucose-Capillary: 137 mg/dL — ABNORMAL HIGH (ref 70–99)
Glucose-Capillary: 151 mg/dL — ABNORMAL HIGH (ref 70–99)
Glucose-Capillary: 166 mg/dL — ABNORMAL HIGH (ref 70–99)
Glucose-Capillary: 283 mg/dL — ABNORMAL HIGH (ref 70–99)
Glucose-Capillary: 172 mg/dL — ABNORMAL HIGH (ref 70–99)

## 2024-10-10 LAB — BASIC METABOLIC PANEL WITH GFR
Anion gap: 10 (ref 5–15)
BUN: 17 mg/dL (ref 8–23)
CO2: 29 mmol/L (ref 22–32)
Calcium: 9.2 mg/dL (ref 8.9–10.3)
Chloride: 100 mmol/L (ref 98–111)
Creatinine, Ser: 1.06 mg/dL (ref 0.61–1.24)
GFR, Estimated: >60 mL/min
Glucose, Bld: 136 mg/dL — ABNORMAL HIGH (ref 70–99)
Potassium: 4 mmol/L (ref 3.5–5.1)
Sodium: 138 mmol/L (ref 135–145)

## 2024-10-10 LAB — MAGNESIUM: Magnesium: 2 mg/dL (ref 1.7–2.4)

## 2024-10-10 LAB — MRSA NEXT GEN BY PCR, NASAL: MRSA by PCR Next Gen: NOT DETECTED

## 2024-10-10 LAB — PHOSPHORUS: Phosphorus: 2.2 mg/dL — ABNORMAL LOW (ref 2.5–4.6)

## 2024-10-10 MED ORDER — CHLORHEXIDINE GLUCONATE 0.12 % MT SOLN: 15.0000 mL | Freq: Once | OROMUCOSAL | Status: AC

## 2024-10-10 MED ORDER — PROPOFOL 10 MG/ML IV BOLUS
INTRAVENOUS | Status: AC
  Filled 2024-10-10: qty 20

## 2024-10-10 MED ORDER — LIDOCAINE 2% (20 MG/ML) 5 ML SYRINGE
INTRAMUSCULAR | Status: DC | PRN
  Administered 2024-10-10: 100 mg via INTRAVENOUS

## 2024-10-10 MED ORDER — FENTANYL CITRATE (PF) 250 MCG/5ML IJ SOLN
INTRAMUSCULAR | Status: DC | PRN
  Administered 2024-10-10 (×2): 50 ug via INTRAVENOUS

## 2024-10-10 MED ORDER — 0.9 % SODIUM CHLORIDE (POUR BTL) OPTIME
TOPICAL | Status: DC | PRN
  Administered 2024-10-10: 1000 mL

## 2024-10-10 MED ORDER — KETOROLAC TROMETHAMINE 30 MG/ML IJ SOLN
INTRAMUSCULAR | Status: AC
  Filled 2024-10-10: qty 1

## 2024-10-10 MED ORDER — ORAL CARE MOUTH RINSE: 15.0000 mL | Freq: Once | OROMUCOSAL | Status: AC

## 2024-10-10 MED ORDER — PHENYLEPHRINE 80 MCG/ML (10ML) SYRINGE FOR IV PUSH (FOR BLOOD PRESSURE SUPPORT)
PREFILLED_SYRINGE | INTRAVENOUS | Status: DC | PRN
  Administered 2024-10-10: 80 ug via INTRAVENOUS
  Administered 2024-10-10: 160 ug via INTRAVENOUS
  Administered 2024-10-10: 80 ug via INTRAVENOUS

## 2024-10-10 MED ORDER — PROPOFOL 10 MG/ML IV BOLUS
INTRAVENOUS | Status: DC | PRN
  Administered 2024-10-10: 170 mg via INTRAVENOUS

## 2024-10-10 MED ORDER — LACTATED RINGERS IV SOLN: INTRAVENOUS | Status: DC

## 2024-10-10 MED ORDER — ONDANSETRON HCL 4 MG/2ML IJ SOLN: 4.0000 mg | Freq: Once | INTRAMUSCULAR | Status: DC | PRN

## 2024-10-10 MED ORDER — MIDAZOLAM HCL 2 MG/2ML IJ SOLN
INTRAMUSCULAR | Status: AC
  Filled 2024-10-10: qty 2

## 2024-10-10 MED ORDER — K PHOS MONO-SOD PHOS DI & MONO 155-852-130 MG PO TABS
500.0000 mg | ORAL_TABLET | Freq: Two times a day (BID) | ORAL | Status: AC
  Administered 2024-10-10 (×2): 500 mg via ORAL
  Filled 2024-10-10 (×2): qty 2

## 2024-10-10 MED ORDER — CHLORHEXIDINE GLUCONATE 0.12 % MT SOLN
OROMUCOSAL | Status: AC
  Administered 2024-10-10: 15 mL via OROMUCOSAL
  Filled 2024-10-10: qty 15

## 2024-10-10 MED ORDER — MIDAZOLAM HCL (PF) 2 MG/2ML IJ SOLN
INTRAMUSCULAR | Status: DC | PRN
  Administered 2024-10-10: 2 mg via INTRAVENOUS

## 2024-10-10 MED ORDER — ONDANSETRON HCL 4 MG/2ML IJ SOLN
INTRAMUSCULAR | Status: DC | PRN
  Administered 2024-10-10: 4 mg via INTRAVENOUS

## 2024-10-10 MED ORDER — KETOROLAC TROMETHAMINE 30 MG/ML IJ SOLN
INTRAMUSCULAR | Status: DC | PRN
  Administered 2024-10-10: 15 mg via INTRAVENOUS

## 2024-10-10 MED ORDER — FENTANYL CITRATE (PF) 100 MCG/2ML IJ SOLN: 25.0000 ug | INTRAMUSCULAR | Status: DC | PRN

## 2024-10-10 MED ORDER — AMISULPRIDE (ANTIEMETIC) 5 MG/2ML IV SOLN: 10.0000 mg | Freq: Once | INTRAVENOUS | Status: DC | PRN

## 2024-10-10 MED ORDER — FENTANYL CITRATE (PF) 100 MCG/2ML IJ SOLN
INTRAMUSCULAR | Status: AC
  Filled 2024-10-10: qty 2

## 2024-10-10 NOTE — Plan of Care (Signed)
" °  Problem: Coping: Goal: Ability to adjust to condition or change in health will improve Outcome: Progressing   Problem: Fluid Volume: Goal: Ability to maintain a balanced intake and output will improve Outcome: Progressing   Problem: Skin Integrity: Goal: Risk for impaired skin integrity will decrease Outcome: Progressing   Problem: Tissue Perfusion: Goal: Adequacy of tissue perfusion will improve Outcome: Progressing   Problem: Activity: Goal: Risk for activity intolerance will decrease Outcome: Progressing   Problem: Coping: Goal: Level of anxiety will decrease Outcome: Progressing   Problem: Nutrition: Goal: Adequate nutrition will be maintained Outcome: Progressing   "

## 2024-10-10 NOTE — Transfer of Care (Signed)
 Immediate Anesthesia Transfer of Care Note  Patient: Anthony Park  Procedure(s) Performed: AMPUTATION BELOW KNEE (Left: Knee)  Patient Location: PACU  Anesthesia Type:General  Level of Consciousness: awake, alert , and oriented  Airway & Oxygen Therapy: Patient Spontanous Breathing  Post-op Assessment: Report given to RN and Post -op Vital signs reviewed and stable  Post vital signs: Reviewed and stable  Last Vitals:  Vitals Value Taken Time  BP 137/88 10/10/24 11:19  Temp    Pulse 91 10/10/24 11:20  Resp 18 10/10/24 11:20  SpO2 93 % 10/10/24 11:20  Vitals shown include unfiled device data.  Last Pain:  Vitals:   10/10/24 1012  TempSrc: Oral  PainSc:       Patients Stated Pain Goal: 2 (10/10/24 1005)  Complications: There were no known notable events for this encounter.

## 2024-10-10 NOTE — Op Note (Signed)
 DATE OF SERVICE: 10/10/2024  PATIENT:  Anthony Park  64 y.o. male  PRE-OPERATIVE DIAGNOSIS:  left below knee amputation stump infection  POST-OPERATIVE DIAGNOSIS:  Same  PROCEDURE:   1) incision and drainage of left below knee amputation stump 2) bone biopsy x 3 from distal left tibia 3) application of negative pressure wound VAC 9 x 3 x 3cm to left below knee amputation stump  SURGEON:  Surgeons and Role:    * Magda Debby SAILOR, MD - Primary  ASSISTANT: Curry Damme, PA-C  An experienced assistant was required given the complexity of this procedure and the standard of surgical care. My assistant helped with exposure through counter tension, suctioning, ligation and retraction to better visualize the surgical field.  My assistant expedited sewing during the case by following my sutures. Wherever I use the term we in the report, my assistant actively helped me with that portion of the procedure.  ANESTHESIA:   general  EBL: minimal  BLOOD ADMINISTERED:none  DRAINS: none   LOCAL MEDICATIONS USED:  NONE  SPECIMEN:  culture of abscess cavity  Biopsy of left tibia  COUNTS: confirmed correct .  TOURNIQUET:  none  PATIENT DISPOSITION:  PACU - hemodynamically stable.   Delay start of Pharmacological VTE agent (>24hrs) due to surgical blood loss or risk of bleeding: no  INDICATION FOR PROCEDURE: Anthony Park is a 64 y.o. male with left below knee amputation stump infection. MRI suggests abscess with possible osteomyelitis. After careful discussion of risks, benefits, and alternatives the patient was offered revision of below knee amputation. The patient understood and wished to proceed.  OPERATIVE FINDINGS: abscess cavity which did not seem to involved the tibia. Culture and bone biopsy taken for microbiologic guidance. Hopeful this will heal with secondary intention and 6 weeks of Abx.  DESCRIPTION OF PROCEDURE: After identification of the patient in the pre-operative  holding area, the patient was transferred to the operating room. The patient was positioned supine on the operating room table. Anesthesia was induced. The left leg was prepped and draped in standard fashion. A surgical pause was performed confirming correct patient, procedure, and operative location.  Transverse incision was made over the area of fluctuance in the distal left below-knee amputation.  Incision was carried down through the subcutaneous tissue until the abscess cavity was encountered and entered bluntly with a hemostat.  Frank purulence drained.  Cultures were taken.  The cavity was evacuated and manually probed.  The bone did not appear to be involved.  There is seem to be a several millimeter thick tissue plane between the abscess and the tibia.  I did take several biopsies of the tibia for microbiologic evaluation later.  Black sponge was applied to the wound with good seal.  The leg was wrapped with Coban.  Upon completion of the case instrument and sharps counts were confirmed correct. The patient was transferred to the PACU in good condition. I was present for all portions of the procedure.  FOLLOW UP PLAN: Follow-up microbiologic data.  Small VAC applied to wound.  Okay to start bedside changes if microdata not alarming.  Anticipate this will heal with secondary intention as long as there is no infection in the bone on biopsy.  Debby SAILOR. Magda, MD [facility] Phone Number: [phone] 10/10/2024 11:13 AM

## 2024-10-10 NOTE — Progress Notes (Signed)
 " PROGRESS NOTE  Anthony Park  DOB: Jan 23, 1961  PCP: Pcp, No FMW:[id number]  DOA: 10/08/2024  LOS: 2 days  Hospital Day: 3  Subjective: Patient was seen and examined this afternoon. Sitting up in bed.  Not in distress.  Wife at bedside. Earlier this morning, patient underwent I&D of left BKA stump, bone biopsy and wound VAC placement Afebrile, hemodynamically stable, breathing on room air. Labs this morning with WC count 12.2, blood sugar level 137    Brief narrative: Anthony Park is a 64 y.o. male with PMH significant for DM2, HTN, HLD, AAA, diabetic foot, PAD s/p left BKA, right foot toes amputations, arthritis. 1/26-patient presented to ED with complaint of pain in the left BKA stump, associated with swelling, redness.  He had left BKA done in 2021 by vascular surgery for osteomyelitis.  He did not have any issues with the stump since then until now.  In the ED, afebrile, hemodynamically stable. WC count 16.1, hemoglobin 14.3, sodium 133.  Left stump x-ray showed soft tissue swelling of the distal stump without emphysema. Possible subtle/early erosive change and lucency at the cut margin of the tibia and the distal anterior cortex of the tibia, raising concern for early changes of osteomyelitis. MRI tibia-fibula showed  -Subcutaneous collection with surrounding enhancement and edema at the distal anterior amputation margin, possible abscess. -Adjacent distal tibial periostitis and mild cortical and marrow enhancement, suspicious for early osteomyelitis. -Subcutaneous edema along the distal stump margin.  With the concern of osteomyelitis and abscess, blood culture was collected.  Started on broad-spectrum IV antibiotics Admitted to [facility] Vascular surgery and orthopedics were consulted 1/28, underwent I&D of left BKA stump, bone biopsy and wound VAC placement by vascular surgery  Assessment and plan: Left BKA stump cellulitis, osteomyelitis and abscess H/o prior left BKA  2021 Presented with pain, redness, swelling WBC count elevated but not septic X-ray and MRI as above Vascular surgery and orthopedics were consulted. 1/28, underwent I&D of left BKA stump, bone biopsy and wound VAC placement by vascular surgery Pain regimen --- Scheduled: Tylenol  1 g 3 times daily, gabapentin  600 mg twice daily --- PRN: IV morphine  2 mg every 2 hours, oral oxycodone  5 mg every 6 hours, Robaxin  500 mg 4 times daily  Ventricular bigeminy On exam his irregular heartbeat.  Ventricular bigeminy noted on telemetry.   Monitor electrolytes.  Potassium level remains good.  Obtain magnesium  and phosphorus level. Continue to monitor in telemetry Recent Labs  Lab 10/08/24 1652 10/08/24 1844 10/09/24 0500 10/10/24 0417  K 3.9 3.9 3.8 4.0    Chronic diastolic CHF Hypertension PTA meds- carvedilol  3.125 mg twice daily, olmesartan 40 mg daily, HCTZ 12 mg daily Continue all. Most recent echo from 06/2024 with EF 55 to 60%, mild LVH, moderately dilated LV, grade 2 diastolic dysfunction, mildly enlarged RV, biatrial dilatation, mild MR aortic valve sclerosis, aortic root dilatation  PAD, AAA HLD Continue aspirin , Crestor .  Keep Plavix  on hold.  Also on Repatha as an outpatient Follows up with vascular surgery Dr. Silver.  Type 2 diabetes mellitus Diabetic neuropathy A1c 6.9 on 10/08/2024 Diet controlled Currently on SSI/Accu-Cheks Gabapentin  as above Recent Labs  Lab 10/09/24 1958 10/09/24 2151 10/10/24 0739 10/10/24 1122 10/10/24 1158  GLUCAP 224* 176* 137* 151* 166*   CKD 2 Hyponatremia Creatinine remains normal. Sodium level slightly low between 130 and 135.  Continue to monitor Recent Labs  Lab 10/08/24 1652 10/08/24 1844 10/08/24 2204 10/09/24 0500 10/10/24 0417  NA 133* 133*  --  133* 138  K 3.9 3.9  --  3.8 4.0  CL 95* 94*  --  96* 100  CO2 24 22  --  23 29  GLUCOSE 151* 139*  --  134* 136*  BUN 21 21  --  19 17  CREATININE 1.08 1.00 1.07 1.07  1.06  CALCIUM  9.2 9.0  --  8.9 9.2     Nutrition Status:         Mobility: Was using prosthesis on left BKA.  May need PT eval after procedure again  PT Orders:   PT Follow up Rec:     Goals of care   Code Status: Full Code     DVT prophylaxis:  enoxaparin  (LOVENOX ) injection 40 mg Start: 10/08/24 2000   Antimicrobials: IV vancomycin , cefepime  Fluid: None Consultants: Vascular surgery/orthopedics Family Communication: None at bedside  Status: Inpatient Level of care: MedSurg currently.  Upgrade to Telemetry   Patient is from: Home Needs to continue in-hospital care: Possible need surgery Anticipated d/c to: Pending clinical course   Diet:  Diet Order             Diet heart healthy/carb modified Room service appropriate? Yes; Fluid consistency: Thin  Diet effective now                   Scheduled Meds:  acetaminophen   1,000 mg Oral TID   aspirin  EC  81 mg Oral Daily   carvedilol   3.125 mg Oral BID   enoxaparin  (LOVENOX ) injection  40 mg Subcutaneous Q24H   gabapentin   600 mg Oral BID   hydrochlorothiazide   25 mg Oral Daily   insulin  aspart  0-15 Units Subcutaneous TID WC   insulin  aspart  0-5 Units Subcutaneous QHS   irbesartan   300 mg Oral Daily   rosuvastatin   20 mg Oral QHS    PRN meds: hydrALAZINE , methocarbamol , morphine  injection, ondansetron  **OR** ondansetron  (ZOFRAN ) IV, oxyCODONE    Infusions:   ceFEPime  (MAXIPIME ) IV 2 g (10/10/24 0414)   vancomycin  1,250 mg (10/09/24 2159)    Antimicrobials: Anti-infectives (From admission, onward)    Start     Dose/Rate Route Frequency Ordered Stop   10/09/24 1000  vancomycin  (VANCOREADY) IVPB 1250 mg/250 mL        1,250 mg 166.7 mL/hr over 90 Minutes Intravenous Every 12 hours 10/08/24 1823     10/08/24 1930  ceFEPIme  (MAXIPIME ) 2 g in sodium chloride  0.9 % 100 mL IVPB        2 g 200 mL/hr over 30 Minutes Intravenous Every 8 hours 10/08/24 1924     10/08/24 1830  Ampicillin -Sulbactam  (UNASYN ) 3 g in sodium chloride  0.9 % 100 mL IVPB        3 g 200 mL/hr over 30 Minutes Intravenous  Once 10/08/24 1818 10/08/24 1855   10/08/24 1830  vancomycin  (VANCOREADY) IVPB 2000 mg/400 mL        2,000 mg 200 mL/hr over 120 Minutes Intravenous  Once 10/08/24 1819 10/09/24 0003       Objective: Vitals:   10/10/24 1145 10/10/24 1203  BP: (!) 106/92 117/79  Pulse: 93 86  Resp: 17 16  Temp: 99 F (37.2 C) 98.8 F (37.1 C)  SpO2: 93% 96%    Intake/Output Summary (Last 24 hours) at 10/10/2024 1355 Last data filed at 10/10/2024 1120 Gross per 24 hour  Intake 1909.59 ml  Output 2210 ml  Net -300.41 ml   Filed Weights   10/08/24 2246 10/10/24 1012  Weight:  111.3 kg 111.3 kg   Weight change:  Body mass index is 33.28 kg/m.   Physical Exam: General exam: Pleasant, middle-aged Caucasian male. Skin: No rashes, lesions or ulcers. HEENT: Atraumatic, normocephalic, no obvious bleeding Lungs: Clear to auscultation bilaterally,  CVS: Irregular heart rhythm, mild systolic murmur GI/Abd: Soft, nontender, nondistended, bowel sound present,   CNS: Alert, awake, oriented x 3 Psychiatry: Mood appropriate Extremities: No pedal edema, no calf tenderness, left BKA stump with bandage and wound VAC attached.  Data Review: I have personally reviewed the laboratory data and studies available.  F/u labs ordered Unresulted Labs (From admission, onward)     Start     Ordered   10/15/24 0500  Creatinine, serum  (enoxaparin  (LOVENOX )    CrCl >/= 30 ml/min)  Weekly,   R     Comments: while on enoxaparin  therapy    10/08/24 1906   10/11/24 0500  CBC  Tomorrow morning,   R       Question:  Specimen collection method  Answer:  Lab=Lab collect   10/10/24 0627   10/11/24 0500  Basic metabolic panel  Tomorrow morning,   R       Question:  Specimen collection method  Answer:  Lab=Lab collect   10/10/24 9372   10/10/24 1355  Phosphorus  Add-on,   AD       Question:  Specimen collection method   Answer:  Lab=Lab collect   10/10/24 1354   10/10/24 1355  Magnesium   Add-on,   AD       Question:  Specimen collection method  Answer:  Lab=Lab collect   10/10/24 1354   10/10/24 1109  Aerobic/Anaerobic Culture w Gram Stain (surgical/deep wound)  RELEASE UPON ORDERING,   TIMED       Comments: Specimen B:  Phone [phone] Immunocompromised?  No  Antibiotic Treatment:  vancymycin Is the patient on airborne/droplet precautions? No Clinical History:  osteomyelitis Special Instructions:  none Specimen Disposition:  OR Specimen Holding     10/10/24 1109   10/10/24 1107  Aerobic/Anaerobic Culture w Gram Stain (surgical/deep wound)  RELEASE UPON ORDERING,   TIMED       Comments: Specimen A:  Phone [phone] Immunocompromised?  No  Antibiotic Treatment:  vancymycin Is the patient on airborne/droplet precautions? No Clinical History:  osteomyelitis Special Instructions:  none Specimen Disposition:  OR Specimen Holding     10/10/24 1107            Signed, Chapman Rota, MD [facility] 10/10/2024  "

## 2024-10-10 NOTE — Progress Notes (Signed)
" °  Daily Progress Note    Subjective: No complaints this morning denies fevers chills  Objective: Vitals:   10/09/24 2356 10/10/24 0410  BP: 102/68 138/88  Pulse: (!) 101 100  Resp: 19 14  Temp: 100.2 F (37.9 C) 99.7 F (37.6 C)  SpO2: 95% 97%    Physical Examination Alert and oriented x 3 Regular rate, normal rhythm Erythema at the left BKA stump  ASSESSMENT/PLAN:  Patient with left BKA stump osteomyelitis Revision today.     Fonda FORBES Rim MD [facility] [phone] 10/10/2024  8:19 AM  "

## 2024-10-10 NOTE — Anesthesia Postprocedure Evaluation (Signed)
"   Anesthesia Post Note  Patient: Anthony Park  Procedure(s) Performed: AMPUTATION BELOW KNEE (Left: Knee) APPLICATION, WOUND VAC (Left: Leg Lower)     Patient location during evaluation: PACU Anesthesia Type: General Level of consciousness: awake and alert Pain management: pain level controlled Vital Signs Assessment: post-procedure vital signs reviewed and stable Respiratory status: spontaneous breathing, nonlabored ventilation and respiratory function stable Cardiovascular status: blood pressure returned to baseline and stable Postop Assessment: no apparent nausea or vomiting Anesthetic complications: no   There were no known notable events for this encounter.  Last Vitals:  Vitals:   10/10/24 1145 10/10/24 1203  BP: (!) 106/92 117/79  Pulse: 93 86  Resp: 17 16  Temp: 37.2 C 37.1 C  SpO2: 93% 96%    Last Pain:  Vitals:   10/10/24 1403  TempSrc:   PainSc: 8                  [name]      "

## 2024-10-10 NOTE — Progress Notes (Signed)
 Pt arrived back form PACU via bed. Pt has wound vac to the left stump after procedure. Pt is alert and oriented times 4. He called his wife to informed her that he back into the room. Pt pain is 101/10, he receive pain medication, see MAR.

## 2024-10-10 NOTE — Anesthesia Preprocedure Evaluation (Signed)
"                                    Anesthesia Evaluation  Patient identified by MRN, date of birth, ID band Patient awake    Reviewed: Allergy & Precautions, NPO status , Patient's Chart, lab work & pertinent test results, reviewed documented beta blocker date and time   Airway Mallampati: III  TM Distance: >3 FB Neck ROM: Full    Dental  (+) Teeth Intact, Dental Advisory Given   Pulmonary former smoker   Pulmonary exam normal breath sounds clear to auscultation       Cardiovascular hypertension, Pt. on home beta blockers + CAD and + Peripheral Vascular Disease  Normal cardiovascular exam Rhythm:Regular Rate:Normal     Neuro/Psych negative neurological ROS     GI/Hepatic negative GI ROS, Neg liver ROS,,,  Endo/Other  negative endocrine ROSdiabetes    Renal/GU Renal InsufficiencyRenal disease     Musculoskeletal  (+) Arthritis ,    Abdominal   Peds  Hematology  (+) Blood dyscrasia, anemia   Anesthesia Other Findings   Reproductive/Obstetrics                              Anesthesia Physical Anesthesia Plan  ASA: 3  Anesthesia Plan: General   Post-op Pain Management: Tylenol  PO (pre-op)* and Gabapentin  PO (pre-op)*   Induction: Intravenous  PONV Risk Score and Plan: 2 and Midazolam , Dexamethasone  and Ondansetron   Airway Management Planned: LMA  Additional Equipment:   Intra-op Plan:   Post-operative Plan: Extubation in OR  Informed Consent: I have reviewed the patients History and Physical, chart, labs and discussed the procedure including the risks, benefits and alternatives for the proposed anesthesia with the patient or authorized representative who has indicated his/her understanding and acceptance.     Dental advisory given  Plan Discussed with: CRNA  Anesthesia Plan Comments:          Anesthesia Quick Evaluation  "

## 2024-10-10 NOTE — Anesthesia Procedure Notes (Signed)
 Procedure Name: LMA Insertion Date/Time: 10/10/2024 10:29 AM  Performed by: Alen Motto D, CRNAPre-anesthesia Checklist: Patient identified, Emergency Drugs available, Suction available and Patient being monitored Patient Re-evaluated:Patient Re-evaluated prior to induction Oxygen Delivery Method: Circle System Utilized Preoxygenation: Pre-oxygenation with 100% oxygen Induction Type: IV induction Ventilation: Mask ventilation without difficulty LMA: LMA inserted LMA Size: 5.0 Number of attempts: 1 Airway Equipment and Method: Bite block Placement Confirmation: positive ETCO2 Tube secured with: Tape Dental Injury: Teeth and Oropharynx as per pre-operative assessment

## 2024-10-10 NOTE — Plan of Care (Signed)
  Problem: Metabolic: Goal: Ability to maintain appropriate glucose levels will improve Outcome: Progressing   Problem: Nutritional: Goal: Maintenance of adequate nutrition will improve Outcome: Progressing   Problem: Health Behavior/Discharge Planning: Goal: Ability to manage health-related needs will improve Outcome: Progressing

## 2024-10-10 NOTE — Progress Notes (Signed)
 [facility] [place] PROGRESS NOTE  ASSESSMENT / PLAN: Anthony Park is a 64 y.o. male with left BKA stump osteomyelitis. Plan revision of BKA stump today in OR. Patient amenable.   SUBJECTIVE: No complaints. Reviewed plan for OR.  OBJECTIVE: BP 138/88 (BP Location: Right Arm)   Pulse 100   Temp 99.7 F (37.6 C) (Oral)   Resp 14   Ht 6' (1.829 m)   Wt 111.3 kg   SpO2 97%   BMI 33.28 kg/m   Intake/Output Summary (Last 24 hours) at 10/10/2024 0956 Last data filed at 10/10/2024 0500 Gross per 24 hour  Intake 1709.59 ml  Output 2200 ml  Net -490.41 ml    No distress Regular rate and rhythm Unlabored breathing L BKA stump with abscess     Latest Ref Rng & Units 10/10/2024    4:17 AM 10/09/2024    5:00 AM 10/08/2024   10:04 PM  CBC  WBC 4.0 - 10.5 K/uL 12.2  17.3  18.8   Hemoglobin 13.0 - 17.0 g/dL 87.0  86.7  85.3   Hematocrit 39.0 - 52.0 % 38.8  40.2  42.7   Platelets 150 - 400 K/uL 142  156  176         Latest Ref Rng & Units 10/10/2024    4:17 AM 10/09/2024    5:00 AM 10/08/2024   10:04 PM  CMP  Glucose 70 - 99 mg/dL 863  865    BUN 8 - 23 mg/dL 17  19    Creatinine 9.38 - 1.24 mg/dL 8.93  8.92  8.92   Sodium 135 - 145 mmol/L 138  133    Potassium 3.5 - 5.1 mmol/L 4.0  3.8    Chloride 98 - 111 mmol/L 100  96    CO2 22 - 32 mmol/L 29  23    Calcium  8.9 - 10.3 mg/dL 9.2  8.9    Total Protein 6.5 - 8.1 g/dL  7.0    Total Bilirubin 0.0 - 1.2 mg/dL  0.8    Alkaline Phos 38 - 126 U/L  49    AST 15 - 41 U/L  26    ALT 0 - 44 U/L  26      Estimated Creatinine Clearance: 91.9 mL/min (by C-G formula based on SCr of 1.06 mg/dL).   Debby SAILOR. Magda, MD [facility] Phone Number: [phone] 10/10/2024 9:56 AM

## 2024-10-11 ENCOUNTER — Encounter (HOSPITAL_COMMUNITY): Payer: Self-pay | Admitting: Vascular Surgery

## 2024-10-11 DIAGNOSIS — M869 Osteomyelitis, unspecified: Secondary | ICD-10-CM | POA: Diagnosis not present

## 2024-10-11 DIAGNOSIS — Z9889 Other specified postprocedural states: Secondary | ICD-10-CM

## 2024-10-11 LAB — BASIC METABOLIC PANEL WITH GFR
Anion gap: 10 (ref 5–15)
BUN: 19 mg/dL (ref 8–23)
CO2: 25 mmol/L (ref 22–32)
Calcium: 8.8 mg/dL — ABNORMAL LOW (ref 8.9–10.3)
Chloride: 102 mmol/L (ref 98–111)
Creatinine, Ser: 1.05 mg/dL (ref 0.61–1.24)
GFR, Estimated: >60 mL/min
Glucose, Bld: 138 mg/dL — ABNORMAL HIGH (ref 70–99)
Potassium: 4 mmol/L (ref 3.5–5.1)
Sodium: 137 mmol/L (ref 135–145)

## 2024-10-11 LAB — GLUCOSE, CAPILLARY
Glucose-Capillary: 141 mg/dL — ABNORMAL HIGH (ref 70–99)
Glucose-Capillary: 169 mg/dL — ABNORMAL HIGH (ref 70–99)
Glucose-Capillary: 199 mg/dL — ABNORMAL HIGH (ref 70–99)
Glucose-Capillary: 185 mg/dL — ABNORMAL HIGH (ref 70–99)

## 2024-10-11 LAB — CBC
HCT: 34.8 % — ABNORMAL LOW (ref 39.0–52.0)
Hemoglobin: 11.5 g/dL — ABNORMAL LOW (ref 13.0–17.0)
MCH: 28.6 pg (ref 26.0–34.0)
MCHC: 33 g/dL (ref 30.0–36.0)
MCV: 86.6 fL (ref 80.0–100.0)
Platelets: 135 10*3/uL — ABNORMAL LOW (ref 150–400)
RBC: 4.02 MIL/uL — ABNORMAL LOW (ref 4.22–5.81)
RDW: 13.2 % (ref 11.5–15.5)
WBC: 9.6 10*3/uL (ref 4.0–10.5)
nRBC: 0 % (ref 0.0–0.2)

## 2024-10-11 MED ORDER — MORPHINE SULFATE (PF) 2 MG/ML IV SOLN
1.0000 mg | INTRAVENOUS | Status: DC | PRN
  Administered 2024-10-12 – 2024-10-15 (×5): 1 mg via INTRAVENOUS
  Filled 2024-10-11 (×5): qty 1

## 2024-10-11 MED ORDER — METHOCARBAMOL 500 MG PO TABS
500.0000 mg | ORAL_TABLET | Freq: Three times a day (TID) | ORAL | Status: AC
  Administered 2024-10-11 – 2024-10-16 (×14): 500 mg via ORAL
  Filled 2024-10-11 (×14): qty 1

## 2024-10-11 NOTE — Plan of Care (Signed)
  Problem: Education: Goal: Ability to describe self-care measures that may prevent or decrease complications (Diabetes Survival Skills Education) will improve Outcome: Progressing   Problem: Coping: Goal: Ability to adjust to condition or change in health will improve Outcome: Progressing   Problem: Health Behavior/Discharge Planning: Goal: Ability to identify and utilize available resources and services will improve Outcome: Progressing

## 2024-10-11 NOTE — Progress Notes (Signed)
 Pt IV pump alarmed occluding upon assessment, resistance was met when flushing with NS, IV was removed. Pt denied any pain at the site even when flushing. Site appears reddened, slight edema at the site, and scant drainage coming from site. Pharmacy contacted and instructed to apply warm/ cold therapy per pt preference on which is comfortable and to assess/notify of any worsening symptoms.

## 2024-10-11 NOTE — TOC Initial Note (Signed)
 Transition of Care (TOC) - Initial/Assessment Note   Spoke to patient at bedside. Patient from home with significant other Anthony Park.   Patient will need a home negative wound pressure system, HHRN nurse for dressing changes twice a week, Monday and Thursdays.   Patient's insurance out of network with [employer]. Patient's insurance in network with [employer]. Mitch with [employer] sent NCM form for  negative wound pressure system. NCM filled out form and sent to PA to sign. Will forward form back to [facility] when received.   [employer] will submit to patient's insurance, if approved  negative wound pressure systemwill be delivered to patient's hospital room prior to discharge and bedside nurse will connect on day of discharge.   Patient voiced understanding   PCP Leita Blind   Patient has had [facility] in the past and would like them again. Darleene with [facility] accepted referral  Patient Details  Name: Anthony Park MRN: [id number] Date of Birth: 1961-08-21  Transition of Care [facility]) CM/SW Contact:    Blind Powell Jansky, RN Phone Number: 10/11/2024, 1:06 PM  Clinical Narrative:                   Expected Discharge Plan: Home w Home Health Services Barriers to Discharge: Continued Medical Work up   Patient Goals and CMS Choice Patient states their goals for this hospitalization and ongoing recovery are:: to return to home CMS Medicare.gov Compare Post Acute Care list provided to:: Patient Choice offered to / list presented to : Patient      Expected Discharge Plan and Services   Discharge Planning Services: CM Consult Post Acute Care Choice: Home Health, Durable Medical Equipment Living arrangements for the past 2 months: Single Family Home                 DME Arranged: Vac DME Agency: [employer] Date DME Agency Contacted: 10/11/24 Time DME Agency Contacted: 1100 Representative spoke with at DME Agency: [facility] HH Arranged: RN HH Agency: [facility]  Care Date [facility] Agency Contacted: 10/11/24 Time HH Agency Contacted: 1305 Representative spoke with at [facility] Agency: Darleene  Prior Living Arrangements/Services Living arrangements for the past 2 months: Single Family Home Lives with:: Significant Other Patient language and need for interpreter reviewed:: Yes Do you feel safe going back to the place where you live?: Yes      Need for Family Participation in Patient Care: Yes (Comment) Care giver support system in place?: Yes (comment) Current home services: DME Criminal Activity/Legal Involvement Pertinent to Current Situation/Hospitalization: No - Comment as needed  Activities of Daily Living   ADL Screening (condition at time of admission) Is the patient deaf or have difficulty hearing?: No Does the patient have difficulty seeing, even when wearing glasses/contacts?: No Does the patient have difficulty concentrating, remembering, or making decisions?: No  Permission Sought/Granted   Permission granted to share information with : Yes, Verbal Permission Granted  Share Information with NAME: Anthony Park signicifant other  Permission granted to share info w AGENCY: [facility], [employer]        Emotional Assessment Appearance:: Appears stated age Attitude/Demeanor/Rapport: Engaged Affect (typically observed): Appropriate Orientation: : Oriented to Self, Oriented to Place, Oriented to  Time, Oriented to Situation Alcohol  / Substance Use: Not Applicable Psych Involvement: No (comment)  Admission diagnosis:  Cellulitis of left lower extremity [L03.116] Osteomyelitis of left tibia (HCC) [M86.9] Osteomyelitis of left tibia, unspecified type [facility]) [M86.9] Patient Active Problem List   Diagnosis  Date Noted   Acute osteomyelitis of left tibia (HCC) 10/09/2024   Abscess of left lower leg 10/09/2024   Osteomyelitis of left tibia (HCC) 10/08/2024   Diastolic dysfunction without heart failure 08/18/2024   Thoracic aortic aneurysm 08/15/2024    Coronary artery calcification seen on computed tomography 08/15/2024   Senile calcific aortic valve sclerosis 08/15/2024   Osteomyelitis of great toe of right foot (HCC) 01/20/2024   Osteomyelitis of second toe of right foot (HCC) 11/02/2023   Hypogonadism, male 01/19/2023   Erectile dysfunction 01/19/2023   Aneurysm of abdominal vessel 01/19/2023   Chronic kidney disease, stage 3a (HCC) 01/19/2023   AAA (abdominal aortic aneurysm) without rupture 01/18/2023   S/P BKA (below knee amputation) unilateral, left (HCC) 10/03/2020   Non-healing wound of lower extremity 10/03/2020   History of amputation of left foot through metatarsal bone (HCC) 09/11/2020   PAD (peripheral artery disease) 08/20/2020   Status post total replacement of left hip 06/05/2020   Unilateral primary osteoarthritis, right hip 04/17/2020   Unilateral primary osteoarthritis, left hip 04/17/2020   Amputated toe, right 11/22/2018   Type 2 diabetes mellitus with foot ulcer, without long-term current use of insulin  (HCC) 11/22/2018   Mixed hyperlipidemia 11/22/2018   Aneurysm of left popliteal artery 11/22/2018   Status post peripheral artery angioplasty with insertion of stent 11/22/2018   Osteomyelitis of third toe of right foot (HCC)    Cellulitis of third toe of right foot    PCP:  Pcp, No Pharmacy:   [id number] [address], [place] - [address] [place] [address] Phone: [phone] Fax: [phone]  [facility] - [place], [place] - [address] [place] [address] Phone: [phone] Fax: [phone]     Social Drivers of Health (SDOH) Social History: SDOH Screenings   Food Insecurity: No Food Insecurity (10/10/2024)  Housing: Low Risk (10/10/2024)  Transportation Needs: No Transportation Needs (10/10/2024)  Utilities: Not At Risk (10/10/2024)  Tobacco Use: Medium Risk (10/08/2024)   SDOH Interventions:      Readmission Risk Interventions     No data to display

## 2024-10-11 NOTE — Progress Notes (Signed)
 " PROGRESS NOTE  Anthony Park  DOB: Apr 11, 1961  PCP: Pcp, No FMW:[id number]  DOA: 10/08/2024  LOS: 3 days  Hospital Day: 4  Subjective: Patient was seen and examined this morning. Sitting up at the edge of the bed.  Wife at bedside.  Patient complains of inadequate pain control at the site of left BKA. Afebrile, hemodynamically stable, breathing on room air Labs this morning with renal function normal, blood glucose of 140s   Brief narrative: Anthony Park is a 64 y.o. male with PMH significant for DM2, HTN, HLD, AAA, diabetic foot, PAD s/p left BKA, right foot toes amputations, arthritis. 1/26-patient presented to ED with complaint of pain in the left BKA stump, associated with swelling, redness.  He had left BKA done in 2021 by vascular surgery for osteomyelitis.  He did not have any issues with the stump since then until now.  In the ED, afebrile, hemodynamically stable. WBC count 16.1,  Left stump x-ray showed soft tissue swelling of the distal stump without emphysema. Possible subtle/early erosive change and lucency at the cut margin of the tibia and the distal anterior cortex of the tibia, raising concern for early changes of osteomyelitis. MRI tibia-fibula showed  -Subcutaneous collection with surrounding enhancement and edema at the distal anterior amputation margin, possible abscess. -Adjacent distal tibial periostitis and mild cortical and marrow enhancement, suspicious for early osteomyelitis. -Subcutaneous edema along the distal stump margin.  With the concern of osteomyelitis and abscess, blood culture was collected.  Started on broad-spectrum IV antibiotics Admitted to [facility] Vascular surgery and orthopedics were consulted 1/28, underwent I&D of left BKA stump, bone biopsy and wound VAC placement by vascular surgery  Assessment and plan: Left BKA stump cellulitis, osteomyelitis and abscess H/o prior left BKA 2021 Presented with pain, redness, swelling WBC count  elevated but not septic X-ray and MRI as above Vascular surgery and orthopedics were consulted. 1/28, underwent I&D of left BKA stump, bone biopsy and wound VAC placement by vascular surgery 1/29, per vascular surgery, VAC to remain in place for 1 week.  Cultures growing rare Staph aureus, final culture pending. Currently on IV cefepime  and IV vancomycin .  Discussed with pharmacy.  Will stop cefepime  today and continue IV vancomycin . Patient complains of inadequate pain control.  He states he was taking Robaxin  at home.  It has been ordered as as needed now but it seems patient has not been receiving it.  I have scheduled it today. Pain regimen --- Scheduled: Tylenol  1 g 3 times daily, gabapentin  600 mg twice daily, Robaxin  5 mg 3 times daily --- PRN: IV morphine  2 mg every 2 hours, oral oxycodone  5 mg every 6 hours  Ventricular bigeminy Hypophosphatemia On exam he had irregular heartbeat.  Ventricular bigeminy noted on telemetry.   Monitor electrolytes.  Potassium and magnesium  levels are good.  1/28, phosphorus level was low at 2.2.  Not sure if it would affect cardiac conduction.  In any case,, replacement given  Continue to monitor in telemetry Recent Labs  Lab 10/08/24 1652 10/08/24 1844 10/09/24 0500 10/10/24 0417 10/11/24 0514  K 3.9 3.9 3.8 4.0 4.0  MG  --   --   --  2.0  --   PHOS  --   --   --  2.2*  --     Chronic diastolic CHF Hypertension PTA meds- carvedilol  3.125 mg twice daily, olmesartan 40 mg daily, HCTZ 12 mg daily Currently continued on all. Most recent echo from 06/2024 with EF  55 to 60%, mild LVH, moderately dilated LV, grade 2 diastolic dysfunction, mildly enlarged RV, biatrial dilatation, mild MR aortic valve sclerosis, aortic root dilatation  PAD, AAA HLD Continue aspirin , Crestor .  Plavix  currently on hold..  Defer to vascular surgery for reinitiation. 3. Also on Repatha as an outpatient Follows up with vascular surgery Dr. Silver.  Type 2 diabetes  mellitus Diabetic neuropathy A1c 6.9 on 10/08/2024 Diet controlled Currently on SSI/Accu-Cheks Gabapentin  as above Recent Labs  Lab 10/10/24 1158 10/10/24 1634 10/10/24 2007 10/11/24 0746 10/11/24 1155  GLUCAP 166* 283* 172* 141* 169*   CKD 2 Hyponatremia Creatinine remains normal. Sodium level slightly low between 130 and 135.  Continue to monitor Recent Labs  Lab 10/08/24 1652 10/08/24 1844 10/08/24 2204 10/09/24 0500 10/10/24 0417 10/11/24 0514  NA 133* 133*  --  133* 138 137  K 3.9 3.9  --  3.8 4.0 4.0  CL 95* 94*  --  96* 100 102  CO2 24 22  --  23 29 25   GLUCOSE 151* 139*  --  134* 136* 138*  BUN 21 21  --  19 17 19   CREATININE 1.08 1.00 1.07 1.07 1.06 1.05  CALCIUM  9.2 9.0  --  8.9 9.2 8.8*  MG  --   --   --   --  2.0  --   PHOS  --   --   --   --  2.2*  --     Nutrition Status:         Mobility: Was using prosthesis on left BKA.  May need PT eval after procedure again  PT Orders:   PT Follow up Rec:     Goals of care   Code Status: Full Code     DVT prophylaxis:  enoxaparin  (LOVENOX ) injection 40 mg Start: 10/08/24 2000   Antimicrobials: IV vancomycin , cefepime  Fluid: None Consultants: Vascular surgery/orthopedics Family Communication: None at bedside  Status: Inpatient Level of care: MedSurg currently.  Upgrade to Telemetry   Patient is from: Home Needs to continue in-hospital care: Possible need surgery Anticipated d/c to: Pending clinical course   Diet:  Diet Order             Diet heart healthy/carb modified Room service appropriate? Yes; Fluid consistency: Thin  Diet effective now                   Scheduled Meds:  acetaminophen   1,000 mg Oral TID   aspirin  EC  81 mg Oral Daily   carvedilol   3.125 mg Oral BID   enoxaparin  (LOVENOX ) injection  40 mg Subcutaneous Q24H   gabapentin   600 mg Oral BID   hydrochlorothiazide   25 mg Oral Daily   insulin  aspart  0-15 Units Subcutaneous TID WC   insulin  aspart  0-5 Units  Subcutaneous QHS   irbesartan   300 mg Oral Daily   methocarbamol   500 mg Oral TID   rosuvastatin   20 mg Oral QHS    PRN meds: hydrALAZINE , morphine  injection, ondansetron  **OR** ondansetron  (ZOFRAN ) IV, oxyCODONE    Infusions:   vancomycin  166.7 mL/hr at 10/11/24 1150    Antimicrobials: Anti-infectives (From admission, onward)    Start     Dose/Rate Route Frequency Ordered Stop   10/09/24 1000  vancomycin  (VANCOREADY) IVPB 1250 mg/250 mL        1,250 mg 166.7 mL/hr over 90 Minutes Intravenous Every 12 hours 10/08/24 1823     10/08/24 1930  ceFEPIme  (MAXIPIME ) 2 g in sodium chloride   0.9 % 100 mL IVPB  Status:  Discontinued        2 g 200 mL/hr over 30 Minutes Intravenous Every 8 hours 10/08/24 1924 10/11/24 1238   10/08/24 1830  Ampicillin -Sulbactam (UNASYN ) 3 g in sodium chloride  0.9 % 100 mL IVPB        3 g 200 mL/hr over 30 Minutes Intravenous  Once 10/08/24 1818 10/08/24 1855   10/08/24 1830  vancomycin  (VANCOREADY) IVPB 2000 mg/400 mL        2,000 mg 200 mL/hr over 120 Minutes Intravenous  Once 10/08/24 1819 10/09/24 0003       Objective: Vitals:   10/11/24 0344 10/11/24 0842  BP: (!) 149/84 116/68  Pulse: 77 75  Resp: 17 18  Temp: 98.8 F (37.1 C) 99.1 F (37.3 C)  SpO2: 99%     Intake/Output Summary (Last 24 hours) at 10/11/2024 1324 Last data filed at 10/11/2024 1200 Gross per 24 hour  Intake 1080 ml  Output 1100 ml  Net -20 ml   Filed Weights   10/08/24 2246 10/10/24 1012  Weight: 111.3 kg 111.3 kg   Weight change:  Body mass index is 33.28 kg/m.   Physical Exam: General exam: Pleasant, middle-aged Caucasian male.  In mild distress from inadequate pain control. Skin: No rashes, lesions or ulcers. HEENT: Atraumatic, normocephalic, no obvious bleeding Lungs: Clear to auscultation bilaterally,  CVS: Irregular heart rhythm, mild systolic murmur GI/Abd: Soft, nontender, nondistended, bowel sound present,   CNS: Alert, awake, oriented x 3 Psychiatry:  Mood appropriate Extremities: No pedal edema, no calf tenderness, left BKA stump with bandage and wound VAC attached.  Data Review: I have personally reviewed the laboratory data and studies available.  F/u labs ordered Unresulted Labs (From admission, onward)     Start     Ordered   10/15/24 0500  Creatinine, serum  (enoxaparin  (LOVENOX )    CrCl >/= 30 ml/min)  Weekly,   R     Comments: while on enoxaparin  therapy    10/08/24 1906            Signed, Chapman Rota, MD [facility] 10/11/2024  "

## 2024-10-11 NOTE — Progress Notes (Addendum)
" °  Progress Note    10/11/2024 8:18 AM 1 Day Post-Op  Subjective:  having a lot of pain in left BKA   Vitals:   10/11/24 0014 10/11/24 0344  BP: 121/69 (!) 149/84  Pulse: 72 77  Resp: 16 17  Temp: 98.4 F (36.9 C) 98.8 F (37.1 C)  SpO2: 98% 99%   Physical Exam: Cardiac:  regular Lungs:  non labored Incisions:  left BKA dressings in place, minimal output from incisional VAC Extremities:  RLE well perfused and warm Neurologic: alert and oriented  CBC    Component Value Date/Time   WBC 9.6 10/11/2024 0514   RBC 4.02 (L) 10/11/2024 0514   HGB 11.5 (L) 10/11/2024 0514   HCT 34.8 (L) 10/11/2024 0514   PLT 135 (L) 10/11/2024 0514   MCV 86.6 10/11/2024 0514   MCH 28.6 10/11/2024 0514   MCHC 33.0 10/11/2024 0514   RDW 13.2 10/11/2024 0514   LYMPHSABS 2.6 10/10/2024 0417   MONOABS 1.1 (H) 10/10/2024 0417   EOSABS 0.1 10/10/2024 0417   BASOSABS 0.1 10/10/2024 0417    BMET    Component Value Date/Time   NA 137 10/11/2024 0514   K 4.0 10/11/2024 0514   CL 102 10/11/2024 0514   CO2 25 10/11/2024 0514   GLUCOSE 138 (H) 10/11/2024 0514   BUN 19 10/11/2024 0514   BUN 14 02/13/2014 1044   CREATININE 1.05 10/11/2024 0514   CREATININE 0.75 09/23/2020 1058   CALCIUM  8.8 (L) 10/11/2024 0514   GFRNONAA >60 10/11/2024 0514   GFRNONAA 85.5 07/31/2024 1303   GFRAA >60 05/20/2020 0933   GFRAA >60 02/13/2014 1044    INR    Component Value Date/Time   INR 1.1 10/03/2020 0630     Intake/Output Summary (Last 24 hours) at 10/11/2024 0818 Last data filed at 10/11/2024 0152 Gross per 24 hour  Intake 560 ml  Output 360 ml  Net 200 ml     Assessment/Plan:  64 y.o. male is s/p 1) incision and drainage of left below knee amputation stump 2) bone biopsy x 3 from distal left tibia 3) application of negative pressure wound VAC 9 x 3 x 3cm to left below knee amputation stump  1 Day Post-Op   Left BKA pain VAC will remain in place 1 week Cultures growing Rare GPC final  cultures pending  On Cefepime  and Vanc   Corrina Baglia, PA-C [facility] [phone] 10/11/2024 8:18 AM  VASCULAR STAFF ADDENDUM: I have independently interviewed and examined the patient. I agree with the above.   Debby SAILOR. Magda, MD [facility] Phone Number: [phone] 10/11/2024 1:49 PM    "

## 2024-10-12 DIAGNOSIS — L97509 Non-pressure chronic ulcer of other part of unspecified foot with unspecified severity: Secondary | ICD-10-CM | POA: Diagnosis not present

## 2024-10-12 DIAGNOSIS — Z89512 Acquired absence of left leg below knee: Secondary | ICD-10-CM

## 2024-10-12 DIAGNOSIS — M869 Osteomyelitis, unspecified: Secondary | ICD-10-CM | POA: Diagnosis not present

## 2024-10-12 DIAGNOSIS — E11621 Type 2 diabetes mellitus with foot ulcer: Secondary | ICD-10-CM | POA: Diagnosis not present

## 2024-10-12 DIAGNOSIS — L02416 Cutaneous abscess of left lower limb: Secondary | ICD-10-CM | POA: Diagnosis not present

## 2024-10-12 LAB — GLUCOSE, CAPILLARY
Glucose-Capillary: 139 mg/dL — ABNORMAL HIGH (ref 70–99)
Glucose-Capillary: 188 mg/dL — ABNORMAL HIGH (ref 70–99)
Glucose-Capillary: 135 mg/dL — ABNORMAL HIGH (ref 70–99)
Glucose-Capillary: 132 mg/dL — ABNORMAL HIGH (ref 70–99)

## 2024-10-12 LAB — BASIC METABOLIC PANEL WITH GFR
Anion gap: 13 (ref 5–15)
BUN: 15 mg/dL (ref 8–23)
CO2: 23 mmol/L (ref 22–32)
Calcium: 9.3 mg/dL (ref 8.9–10.3)
Chloride: 100 mmol/L (ref 98–111)
Creatinine, Ser: 0.91 mg/dL (ref 0.61–1.24)
GFR, Estimated: >60 mL/min
Glucose, Bld: 129 mg/dL — ABNORMAL HIGH (ref 70–99)
Potassium: 3.8 mmol/L (ref 3.5–5.1)
Sodium: 136 mmol/L (ref 135–145)

## 2024-10-12 LAB — CBC
HCT: 36.3 % — ABNORMAL LOW (ref 39.0–52.0)
Hemoglobin: 12.1 g/dL — ABNORMAL LOW (ref 13.0–17.0)
MCH: 28.9 pg (ref 26.0–34.0)
MCHC: 33.3 g/dL (ref 30.0–36.0)
MCV: 86.6 fL (ref 80.0–100.0)
Platelets: 161 10*3/uL (ref 150–400)
RBC: 4.19 MIL/uL — ABNORMAL LOW (ref 4.22–5.81)
RDW: 12.8 % (ref 11.5–15.5)
WBC: 8 10*3/uL (ref 4.0–10.5)
nRBC: 0 % (ref 0.0–0.2)

## 2024-10-12 LAB — VANCOMYCIN, TROUGH: Vancomycin Tr: 14 ug/mL — ABNORMAL LOW (ref 15–20)

## 2024-10-12 LAB — VANCOMYCIN, PEAK: Vancomycin Pk: 28 ug/mL — ABNORMAL LOW (ref 30–40)

## 2024-10-12 MED ORDER — GABAPENTIN 300 MG PO CAPS
600.0000 mg | ORAL_CAPSULE | Freq: Three times a day (TID) | ORAL | Status: DC
  Administered 2024-10-12 – 2024-10-16 (×11): 600 mg via ORAL
  Filled 2024-10-12 (×9): qty 2
  Filled 2024-10-12: qty 6
  Filled 2024-10-12: qty 2

## 2024-10-12 MED ORDER — TRAMADOL HCL 50 MG PO TABS
50.0000 mg | ORAL_TABLET | Freq: Four times a day (QID) | ORAL | Status: DC | PRN
  Administered 2024-10-12 – 2024-10-16 (×9): 50 mg via ORAL
  Filled 2024-10-12 (×9): qty 1

## 2024-10-12 NOTE — Progress Notes (Signed)
 " PROGRESS NOTE    Anthony Park  FMW:[id number] DOB: 12-Jun-1961 DOA: 10/08/2024 PCP: Pcp, No    Brief Narrative:  Patient is a 64 year old male with PMHx of PVD, HTN, T2DM, abdominal aortic aneurysm, HLD, s/p left BKA 2/2 osteomyelitis who presented to the ED on 10/08/2024 with left stump pain, swelling, and erythema.  He had his BKA done in 2021 by vascular surgery for osteomyelitis and has had no issues until now.  He was afebrile in the ED, hemodynamically stable, with leukocytosis 16.1.  Left stump x-ray showed soft tissue swelling of the distal stump without emphysema, noted possible subtle/early erosive changes and lucency at the margin of the tibia and the distal anterior cortex of the tibia, raising concern for early changes of osteomyelitis.  MRI showed subcutaneous collection with surrounding enhancement and edema at the distal anterior amputation margin, possible abscess. Adjacent distal tibial periostitis and mild cortical and marrow enhancement, suspicious for early osteomyelitis. Subcutaneous edema along distal stump margin.   The patient underwent I&D of the left BKA stump, bone biopsy and wound vac placement by vascular surgery on 1/28.   Assessment and Plan:   Left BKA stump cellulitis, osteolysis, and abscess History of left BKA (2021) - Status post I&D of left BKA stump with bone biopsy of distal left tibia, and wound VAC placement on 1/28 by vascular surgery - Vascular surgery following, indicated wound vac for 1 week. Plan to take back to OR for wound evaluation on 2/2 - Tissue biopsy culture positive for MRSA - Bone biopsy culture positive for Staph aureus, susceptibilities pending - Continue vancomycin .  Cefepime  stopped on 10/11/2024 - Continue Tylenol  1 g TID, Robaxin  5 mg TID, PRN IV morphine  2 mg q2h - Increased gabapentin  600 mg from BID to TID - Discontinued PRN oxycodone  5 mg q6h, started home tramadol  50 mg q6h PRN  Chronic diastolic CHF - TTE (06/2024) EF  55-60%, mild LVH, mod dilated LV, G2DD, mildly enlarged RV, biatrial dilatation, mild MR, aortic root dilation - Continue home Coreg  3.125 mg BID, irbesartan  300 mg (for PTA olmesartan) - Appears euvolemic at this time  PAD - Continue aspirin  and Crestor  - Plavix  held, defer to vascular surgery for reinitiation  Normocytic anemia, mild - Hgb baseline 13-14 - Hgb 12.1 - Continue to monitor  Ventricular bigeminy - Noted on telemetry - Continue to monitor  HTN - Continue Coreg  3.125 twice daily, HCTZ 25, irbesartan  300 mg daily  T2DM - A1c 6.9 (10/08/2024) - SSI 0-15 AC and 0-5 HS  Diabetic neuropathy - Continue gabapentin   HLD - PTA Repatha and Crestor   - Continue Crestor  20  Hyponatremia - resolved   Scheduled Meds:  acetaminophen   1,000 mg Oral TID   aspirin  EC  81 mg Oral Daily   carvedilol   3.125 mg Oral BID   enoxaparin  (LOVENOX ) injection  40 mg Subcutaneous Q24H   gabapentin   600 mg Oral BID   hydrochlorothiazide   25 mg Oral Daily   insulin  aspart  0-15 Units Subcutaneous TID WC   insulin  aspart  0-5 Units Subcutaneous QHS   irbesartan   300 mg Oral Daily   methocarbamol   500 mg Oral TID   rosuvastatin   20 mg Oral QHS   Continuous Infusions:  vancomycin  1,250 mg (10/11/24 2149)   PRN Meds:.hydrALAZINE , morphine  injection, ondansetron  **OR** ondansetron  (ZOFRAN ) IV, oxyCODONE   Current Outpatient Medications  Medication Instructions   acetaminophen  (TYLENOL ) 1,000 mg, Every 6 hours PRN   aspirin  EC 81 mg, Oral, Daily,  Swallow whole.   Berberine Chloride 500 mg, Oral, Daily   carvedilol  (COREG ) 3.125 mg, Oral, 2 times daily   clopidogrel  (PLAVIX ) 75 mg, Oral, Daily   EpiPen  2-Pak 0.3 mg, Intramuscular, As needed   gabapentin  (NEURONTIN ) 300 mg, Oral, Daily PRN   hydrochlorothiazide  (HYDRODIURIL ) 25 mg, Oral, Daily   methocarbamol  (ROBAXIN ) 500 mg, Oral, Every 6 hours PRN   Multiple Vitamin (MULTIVITAMIN WITH MINERALS) TABS tablet 1 tablet, Oral, 3 times  weekly   mupirocin  ointment (BACTROBAN ) 2 % 1 Application, Topical, Daily PRN   olmesartan (BENICAR) 40 mg, Oral, Daily   Repatha SureClick 140 mg, Subcutaneous, Every 14 days   rosuvastatin  (CRESTOR ) 20 mg, Oral, Daily at bedtime   tiZANidine (ZANAFLEX) 2 mg, Oral, At bedtime PRN   traMADol  (ULTRAM ) 50 mg, Oral, Every 6 hours PRN    DVT prophylaxis: enoxaparin  (LOVENOX ) injection 40 mg Start: 10/08/24 2000   Code Status:   Code Status: Full Code  Family Communication: None  Disposition Plan: Home with HH pending vascular surgery clearance, plans for OR wound eval on 2/2 PT -   -   OT -   -    Level of care: Telemetry  Consultants:  Vascular surgery  Procedures:  1/28 - left BKA stump I&D, bone biopsy, wound vac placement  Antimicrobials: Cefepime  stopped 1/29 Vancomycin    Subjective: Examined at bedside. Overall doing well. Pain is controlled the majority of the time but he's having lapses in coverage between doses. Discussed adjusting gabapentin  to TID and changing oxycodone  to his home tramadol   Objective: Vitals:   10/11/24 0842 10/11/24 1605 10/11/24 2037 10/12/24 0446  BP: 116/68 104/60 134/68 122/75  Pulse: 75 71 75 67  Resp: 18 18 17 16   Temp: 99.1 F (37.3 C) 98.1 F (36.7 C)  98.2 F (36.8 C)  TempSrc: Oral   Oral  SpO2:  96% 98% 97%  Weight:      Height:        Intake/Output Summary (Last 24 hours) at 10/12/2024 0838 Last data filed at 10/12/2024 0500 Gross per 24 hour  Intake 1080 ml  Output 3300 ml  Net -2220 ml   Filed Weights   10/08/24 2246 10/10/24 1012  Weight: 111.3 kg 111.3 kg    Examination:  Gen: NAD, A&Ox3 HEENT: NCAT Neck: Supple CV: RRR, II/VI systolic murmur Resp: normal WOB, CTAB, no w/r/r Abd: Soft, NTND Ext: Left BKA with wound vac in place Skin: Warm, dry Neuro: CN II-XII grossly intact, strength 5/5 b/l, sensation intact Psych: Calm, cooperative, appropriate affect    Data Reviewed: I have personally reviewed  following labs and imaging studies  CBC: Recent Labs  Lab 10/08/24 1652 10/08/24 1844 10/08/24 2204 10/09/24 0500 10/10/24 0417 10/11/24 0514  WBC 16.1* 16.2* 18.8* 17.3* 12.2* 9.6  NEUTROABS 13.0* 13.2*  --   --  8.3*  --   HGB 14.3 13.8 14.6 13.2 12.9* 11.5*  HCT 41.6 39.8 42.7 40.2 38.8* 34.8*  MCV 84.4 84.7 84.9 87.6 87.4 86.6  PLT 168 164 176 156 142* 135*   Basic Metabolic Panel: Recent Labs  Lab 10/08/24 1652 10/08/24 1844 10/08/24 2204 10/09/24 0500 10/10/24 0417 10/11/24 0514  NA 133* 133*  --  133* 138 137  K 3.9 3.9  --  3.8 4.0 4.0  CL 95* 94*  --  96* 100 102  CO2 24 22  --  23 29 25   GLUCOSE 151* 139*  --  134* 136* 138*  BUN 21 21  --  19 17 19   CREATININE 1.08 1.00 1.07 1.07 1.06 1.05  CALCIUM  9.2 9.0  --  8.9 9.2 8.8*  MG  --   --   --   --  2.0  --   PHOS  --   --   --   --  2.2*  --    GFR: Estimated Creatinine Clearance: 92.8 mL/min (by C-G formula based on SCr of 1.05 mg/dL). Liver Function Tests: Recent Labs  Lab 10/08/24 1652 10/08/24 1844 10/09/24 0500  AST 36 36 26  ALT 31 34 26  ALKPHOS 54 52 49  BILITOT 0.5 0.5 0.8  PROT 7.4 6.8 7.0  ALBUMIN  4.4 4.4 4.1   No results for input(s): LIPASE, AMYLASE in the last 168 hours. No results for input(s): AMMONIA in the last 168 hours. Coagulation Profile: No results for input(s): INR, PROTIME in the last 168 hours. Cardiac Enzymes: No results for input(s): CKTOTAL, CKMB, CKMBINDEX, TROPONINI in the last 168 hours. BNP (last 3 results) No results for input(s): PROBNP in the last 8760 hours. HbA1C: No results for input(s): HGBA1C in the last 72 hours. CBG: Recent Labs  Lab 10/10/24 2007 10/11/24 0746 10/11/24 1155 10/11/24 1608 10/11/24 2032  GLUCAP 172* 141* 169* 199* 185*   Lipid Profile: No results for input(s): CHOL, HDL, LDLCALC, TRIG, CHOLHDL, LDLDIRECT in the last 72 hours. Thyroid Function Tests: No results for input(s): TSH,  T4TOTAL, FREET4, T3FREE, THYROIDAB in the last 72 hours. Anemia Panel: No results for input(s): VITAMINB12, FOLATE, FERRITIN, TIBC, IRON, RETICCTPCT in the last 72 hours. Sepsis Labs: Recent Labs  Lab 10/08/24 1735 10/08/24 2216  LATICACIDVEN 1.9 2.9*    Recent Results (from the past 240 hours)  Culture, blood (routine x 2)     Status: None (Preliminary result)   Collection Time: 10/08/24  4:50 PM   Specimen: BLOOD  Result Value Ref Range Status   Specimen Description BLOOD RIGHT ANTECUBITAL  Final   Special Requests   Final    BOTTLES DRAWN AEROBIC AND ANAEROBIC Blood Culture adequate volume   Culture   Final    NO GROWTH 4 DAYS Performed at [facility], [address], [place], [place] [address]    Report Status PENDING  Incomplete  Culture, blood (routine x 2)     Status: None (Preliminary result)   Collection Time: 10/08/24  4:52 PM   Specimen: BLOOD LEFT FOREARM  Result Value Ref Range Status   Specimen Description BLOOD LEFT FOREARM  Final   Special Requests   Final    BOTTLES DRAWN AEROBIC AND ANAEROBIC Blood Culture adequate volume   Culture   Final    NO GROWTH 4 DAYS Performed at [facility], [address], [place], [place] [address]    Report Status PENDING  Incomplete  MRSA Next Gen by PCR, Nasal     Status: None   Collection Time: 10/10/24  5:13 AM   Specimen: Nasal Mucosa; Nasal Swab  Result Value Ref Range Status   MRSA by PCR Next Gen NOT DETECTED NOT DETECTED Final    Comment: (NOTE) The GeneXpert MRSA Assay (FDA approved for NASAL specimens only), is one component of a comprehensive MRSA colonization surveillance program. It is not intended to diagnose MRSA infection nor to guide or monitor treatment for MRSA infections. Test performance is not FDA approved in patients less than 36 years old. Performed at [facility], [address], [place], [place] [address]   Aerobic/Anaerobic  Culture w Gram Stain  (surgical/deep wound)     Status: None (Preliminary result)   Collection Time: 10/10/24 11:07 AM   Specimen: PATH Amputaion Arm/Leg; Tissue  Result Value Ref Range Status   Specimen Description TISSUE  Final   Special Requests SWAB FROM LEFT BKA WOUND  Final   Gram Stain   Final    MODERATE WBC PRESENT, PREDOMINANTLY PMN RARE GRAM POSITIVE COCCI    Culture   Final    FEW STAPHYLOCOCCUS AUREUS SUSCEPTIBILITIES TO FOLLOW Performed at [facility], [address], [place], [place] [address]    Report Status PENDING  Incomplete  Aerobic/Anaerobic Culture w Gram Stain (surgical/deep wound)     Status: None (Preliminary result)   Collection Time: 10/10/24 11:07 AM   Specimen: Bone; Tissue  Result Value Ref Range Status   Specimen Description BONE  Final   Special Requests LEFT TIBIA BKA  Final   Gram Stain RARE WBC SEEN NO ORGANISMS SEEN   Final   Culture   Final    RARE STAPHYLOCOCCUS AUREUS CULTURE REINCUBATED FOR BETTER GROWTH Performed at [facility], [address] [place], [place] [address]    Report Status PENDING  Incomplete     Radiology Studies: No results found.  Scheduled Meds:  acetaminophen   1,000 mg Oral TID   aspirin  EC  81 mg Oral Daily   carvedilol   3.125 mg Oral BID   enoxaparin  (LOVENOX ) injection  40 mg Subcutaneous Q24H   gabapentin   600 mg Oral BID   hydrochlorothiazide   25 mg Oral Daily   insulin  aspart  0-15 Units Subcutaneous TID WC   insulin  aspart  0-5 Units Subcutaneous QHS   irbesartan   300 mg Oral Daily   methocarbamol   500 mg Oral TID   rosuvastatin   20 mg Oral QHS   Continuous Infusions:  vancomycin  1,250 mg (10/11/24 2149)     Unresulted Labs (From admission, onward)     Start     Ordered   10/15/24 0500  Creatinine, serum  (enoxaparin  (LOVENOX )    CrCl >/= 30 ml/min)  Weekly,   R     Comments: while on enoxaparin  therapy    10/08/24 1906   10/13/24 0500  CBC  Tomorrow morning,   R       Question:  Specimen  collection method  Answer:  Lab=Lab collect   10/12/24 2240   10/13/24 0500  Renal function panel  Tomorrow morning,   R       Question:  Specimen collection method  Answer:  Lab=Lab collect   10/12/24 2240             LOS:  LOS: 4 days   Time Spent: 45 minutes  Feven Alderfer Al-Sultani, MD [facility]  If 7PM-7AM, please contact night-coverage  10/12/2024, 8:38 AM      "

## 2024-10-12 NOTE — Plan of Care (Signed)
   Problem: Coping: Goal: Ability to adjust to condition or change in health will improve Outcome: Progressing   Problem: Fluid Volume: Goal: Ability to maintain a balanced intake and output will improve Outcome: Progressing

## 2024-10-12 NOTE — TOC Progression Note (Addendum)
 Transition of Care (TOC) - Progression Note   Provider completed  application for wound negative pressure system , NCM emailed form back to [place] with [employer]. Confirmed with Thomasina he received form and has submitted for insurance authorization  Patient Details  Name: Anthony Park MRN: [id number] Date of Birth: [birth year]  Transition of Care [facility]) CM/SW Contact  Algis Lehenbauer, Powell Jansky, RN Phone Number: 10/12/2024, 9:17 AM  Clinical Narrative:       Expected Discharge Plan: Home w Home Health Services Barriers to Discharge: Continued Medical Work up               Expected Discharge Plan and Services   Discharge Planning Services: CM Consult Post Acute Care Choice: Home Health, Durable Medical Equipment Living arrangements for the past 2 months: Single Family Home                 DME Arranged: Vac DME Agency: [employer] Date DME Agency Contacted: 10/11/24 Time DME Agency Contacted: 1100 Representative spoke with at DME Agency: Thomasina HH Arranged: RN HH Agency: [facility] Date [facility] Agency Contacted: 10/11/24 Time HH Agency Contacted: 1305 Representative spoke with at [facility] Agency: Darleene   Social Drivers of Health (SDOH) Interventions SDOH Screenings   Food Insecurity: No Food Insecurity (10/10/2024)  Housing: Low Risk (10/10/2024)  Transportation Needs: No Transportation Needs (10/10/2024)  Utilities: Not At Risk (10/10/2024)  Tobacco Use: Medium Risk (10/08/2024)    Readmission Risk Interventions     No data to display

## 2024-10-12 NOTE — Progress Notes (Addendum)
" °  Progress Note    10/12/2024 7:44 AM 2 Days Post-Op  Subjective:  having a lot of pain in left BKA   Vitals:   10/11/24 2037 10/12/24 0446  BP: 134/68 122/75  Pulse: 75 67  Resp: 17 16  Temp:  98.2 F (36.8 C)  SpO2: 98% 97%   Physical Exam: Cardiac:  regular Lungs:  non labored Incisions:  left BKA dressed, VAC to suction. Minimal output in canister Neurologic: alert and oriented  CBC    Component Value Date/Time   WBC 9.6 10/11/2024 0514   RBC 4.02 (L) 10/11/2024 0514   HGB 11.5 (L) 10/11/2024 0514   HCT 34.8 (L) 10/11/2024 0514   PLT 135 (L) 10/11/2024 0514   MCV 86.6 10/11/2024 0514   MCH 28.6 10/11/2024 0514   MCHC 33.0 10/11/2024 0514   RDW 13.2 10/11/2024 0514   LYMPHSABS 2.6 10/10/2024 0417   MONOABS 1.1 (H) 10/10/2024 0417   EOSABS 0.1 10/10/2024 0417   BASOSABS 0.1 10/10/2024 0417    BMET    Component Value Date/Time   NA 137 10/11/2024 0514   K 4.0 10/11/2024 0514   CL 102 10/11/2024 0514   CO2 25 10/11/2024 0514   GLUCOSE 138 (H) 10/11/2024 0514   BUN 19 10/11/2024 0514   BUN 14 02/13/2014 1044   CREATININE 1.05 10/11/2024 0514   CREATININE 0.75 09/23/2020 1058   CALCIUM  8.8 (L) 10/11/2024 0514   GFRNONAA >60 10/11/2024 0514   GFRNONAA 85.5 07/31/2024 1303   GFRAA >60 05/20/2020 0933   GFRAA >60 02/13/2014 1044    INR    Component Value Date/Time   INR 1.1 10/03/2020 0630     Intake/Output Summary (Last 24 hours) at 10/12/2024 0744 Last data filed at 10/12/2024 0500 Gross per 24 hour  Intake 1080 ml  Output 3300 ml  Net -2220 ml     Assessment/Plan:  64 y.o. male is s/p  1) incision and drainage of left below knee amputation stump 2) bone biopsy x 3 from distal left tibia 3) application of negative pressure wound VAC 9 x 3 x 3cm to left below knee amputation stump   2 Days Post-Op   Pain control PRN Pending final culture and sensitivities Continue Vanc Continue VAC to left BKA Plan will be to take back to OR on Monday to  re evaluate wound Orders placed already for Home [facility] RN   Teretha Damme, [place] [facility] [phone] 10/12/2024 7:44 AM  VASCULAR STAFF ADDENDUM: I have independently interviewed and examined the patient. I agree with the above.  Reviewed findings so far. Some concern for osteomyelitis based on bone biopsy. Will plan to return to OR Monday with Dr. Lanis for BKA revision if final cultures show + osteomyelitis.  Debby SAILOR. Magda, MD [facility] Phone Number: [phone] 10/12/2024 4:01 PM    "

## 2024-10-12 NOTE — Progress Notes (Signed)
 Pharmacy Antibiotic Note  Anthony Park is a 64 y.o. male admitted on 10/08/2024 with concern for osteomyelitis s/p amputation.  Pharmacy has been consulted for vancomycin  dosing.  Vancomycin  peak = 28, trough = 14, AUC = 496 which is within goal of 400-550  Plan: Continue Vancomycin  1250g IV q 12h Monitor renal function, Cx and clinical progression to narrow  Height: 6' (182.9 cm) Weight: 111.3 kg (245 lb 6 oz) IBW/kg (Calculated) : 77.6  Temp (24hrs), Avg:98.2 F (36.8 C), Min:98 F (36.7 C), Max:98.5 F (36.9 C)  Recent Labs  Lab 10/08/24 1735 10/08/24 1844 10/08/24 2204 10/08/24 2216 10/09/24 0500 10/10/24 0417 10/11/24 0514 10/12/24 0930 10/12/24 1211 10/12/24 1930  WBC  --    < > 18.8*  --  17.3* 12.2* 9.6 8.0  --   --   CREATININE  --    < > 1.07  --  1.07 1.06 1.05 0.91  --   --   LATICACIDVEN 1.9  --   --  2.9*  --   --   --   --   --   --   VANCOTROUGH  --   --   --   --   --   --   --   --   --  14*  VANCOPEAK  --   --   --   --   --   --   --   --  28*  --    < > = values in this interval not displayed.    Estimated Creatinine Clearance: 107.1 mL/min (by C-G formula based on SCr of 0.91 mg/dL).    Allergies[1]  Ampsulb 1/26  Cefe 1/26 >>1/29 Vanc 1/26 >>    1/26 MRSA neg 1/26 Bcx ngtd 1/28 OR tissue - MRSA vanc MIC < 0.5  Rocky Slade, PharmD, BCPS 10/12/2024 8:32 PM       [1]  Allergies Allergen Reactions   Meloxicam  Nausea Only    Upset stomach

## 2024-10-12 NOTE — Plan of Care (Signed)
" °  Problem: Education: Goal: Ability to describe self-care measures that may prevent or decrease complications (Diabetes Survival Skills Education) will improve Outcome: Progressing   Problem: Coping: Goal: Ability to adjust to condition or change in health will improve Outcome: Progressing   Problem: Health Behavior/Discharge Planning: Goal: Ability to identify and utilize available resources and services will improve Outcome: Progressing   Problem: Pain Managment: Goal: General experience of comfort will improve and/or be controlled Outcome: Progressing   "

## 2024-10-13 DIAGNOSIS — L97509 Non-pressure chronic ulcer of other part of unspecified foot with unspecified severity: Secondary | ICD-10-CM | POA: Diagnosis not present

## 2024-10-13 DIAGNOSIS — M869 Osteomyelitis, unspecified: Secondary | ICD-10-CM | POA: Diagnosis not present

## 2024-10-13 DIAGNOSIS — L02416 Cutaneous abscess of left lower limb: Secondary | ICD-10-CM | POA: Diagnosis not present

## 2024-10-13 DIAGNOSIS — Z89512 Acquired absence of left leg below knee: Secondary | ICD-10-CM | POA: Diagnosis not present

## 2024-10-13 DIAGNOSIS — E11621 Type 2 diabetes mellitus with foot ulcer: Secondary | ICD-10-CM | POA: Diagnosis not present

## 2024-10-13 LAB — CBC
HCT: 35.8 % — ABNORMAL LOW (ref 39.0–52.0)
Hemoglobin: 11.9 g/dL — ABNORMAL LOW (ref 13.0–17.0)
MCH: 29.2 pg (ref 26.0–34.0)
MCHC: 33.2 g/dL (ref 30.0–36.0)
MCV: 87.7 fL (ref 80.0–100.0)
Platelets: 158 10*3/uL (ref 150–400)
RBC: 4.08 MIL/uL — ABNORMAL LOW (ref 4.22–5.81)
RDW: 13 % (ref 11.5–15.5)
WBC: 7.9 10*3/uL (ref 4.0–10.5)
nRBC: 0 % (ref 0.0–0.2)

## 2024-10-13 LAB — CULTURE, BLOOD (ROUTINE X 2)
Culture: NO GROWTH
Culture: NO GROWTH
Special Requests: ADEQUATE
Special Requests: ADEQUATE

## 2024-10-13 LAB — RENAL FUNCTION PANEL
Albumin: 3.6 g/dL (ref 3.5–5.0)
Anion gap: 13 (ref 5–15)
BUN: 17 mg/dL (ref 8–23)
CO2: 21 mmol/L — ABNORMAL LOW (ref 22–32)
Calcium: 9.1 mg/dL (ref 8.9–10.3)
Chloride: 101 mmol/L (ref 98–111)
Creatinine, Ser: 0.93 mg/dL (ref 0.61–1.24)
GFR, Estimated: >60 mL/min
Glucose, Bld: 264 mg/dL — ABNORMAL HIGH (ref 70–99)
Phosphorus: 3.1 mg/dL (ref 2.5–4.6)
Potassium: 3.9 mmol/L (ref 3.5–5.1)
Sodium: 135 mmol/L (ref 135–145)

## 2024-10-13 LAB — GLUCOSE, CAPILLARY
Glucose-Capillary: 194 mg/dL — ABNORMAL HIGH (ref 70–99)
Glucose-Capillary: 149 mg/dL — ABNORMAL HIGH (ref 70–99)
Glucose-Capillary: 125 mg/dL — ABNORMAL HIGH (ref 70–99)
Glucose-Capillary: 182 mg/dL — ABNORMAL HIGH (ref 70–99)

## 2024-10-13 NOTE — Plan of Care (Signed)
  Problem: Education: Goal: Ability to describe self-care measures that may prevent or decrease complications (Diabetes Survival Skills Education) will improve Outcome: Progressing   Problem: Coping: Goal: Ability to adjust to condition or change in health will improve Outcome: Progressing   Problem: Health Behavior/Discharge Planning: Goal: Ability to identify and utilize available resources and services will improve Outcome: Progressing   Problem: Nutritional: Goal: Progress toward achieving an optimal weight will improve Outcome: Progressing   Problem: Skin Integrity: Goal: Risk for impaired skin integrity will decrease Outcome: Progressing

## 2024-10-14 DIAGNOSIS — E11621 Type 2 diabetes mellitus with foot ulcer: Secondary | ICD-10-CM | POA: Diagnosis not present

## 2024-10-14 DIAGNOSIS — L02416 Cutaneous abscess of left lower limb: Secondary | ICD-10-CM | POA: Diagnosis not present

## 2024-10-14 DIAGNOSIS — Z89512 Acquired absence of left leg below knee: Secondary | ICD-10-CM | POA: Diagnosis not present

## 2024-10-14 DIAGNOSIS — M869 Osteomyelitis, unspecified: Secondary | ICD-10-CM | POA: Diagnosis not present

## 2024-10-14 DIAGNOSIS — Z4889 Encounter for other specified surgical aftercare: Secondary | ICD-10-CM

## 2024-10-14 LAB — CBC
HCT: 37.3 % — ABNORMAL LOW (ref 39.0–52.0)
Hemoglobin: 12.5 g/dL — ABNORMAL LOW (ref 13.0–17.0)
MCH: 29.1 pg (ref 26.0–34.0)
MCHC: 33.5 g/dL (ref 30.0–36.0)
MCV: 86.9 fL (ref 80.0–100.0)
Platelets: 174 10*3/uL (ref 150–400)
RBC: 4.29 MIL/uL (ref 4.22–5.81)
RDW: 13.1 % (ref 11.5–15.5)
WBC: 8 10*3/uL (ref 4.0–10.5)
nRBC: 0 % (ref 0.0–0.2)

## 2024-10-14 LAB — RENAL FUNCTION PANEL
Albumin: 3.6 g/dL (ref 3.5–5.0)
Anion gap: 12 (ref 5–15)
BUN: 16 mg/dL (ref 8–23)
CO2: 23 mmol/L (ref 22–32)
Calcium: 9.3 mg/dL (ref 8.9–10.3)
Chloride: 101 mmol/L (ref 98–111)
Creatinine, Ser: 0.79 mg/dL (ref 0.61–1.24)
GFR, Estimated: >60 mL/min
Glucose, Bld: 224 mg/dL — ABNORMAL HIGH (ref 70–99)
Phosphorus: 3.6 mg/dL (ref 2.5–4.6)
Potassium: 3.8 mmol/L (ref 3.5–5.1)
Sodium: 136 mmol/L (ref 135–145)

## 2024-10-14 LAB — GLUCOSE, CAPILLARY
Glucose-Capillary: 182 mg/dL — ABNORMAL HIGH (ref 70–99)
Glucose-Capillary: 198 mg/dL — ABNORMAL HIGH (ref 70–99)
Glucose-Capillary: 198 mg/dL — ABNORMAL HIGH (ref 70–99)
Glucose-Capillary: 169 mg/dL — ABNORMAL HIGH (ref 70–99)

## 2024-10-14 NOTE — Progress Notes (Signed)
 " PROGRESS NOTE    Anthony Park  FMW:[id number] DOB: 07/31/1961 DOA: 10/08/2024 PCP: Pcp, No    Brief Narrative:  Patient is a 64 year old male with PMHx of PVD, HTN, T2DM, abdominal aortic aneurysm, HLD, s/p left BKA 2/2 osteomyelitis who presented to the ED on 10/08/2024 with left stump pain, swelling, and erythema.  He had his BKA done in 2021 by vascular surgery for osteomyelitis and has had no issues until now.  He was afebrile in the ED, hemodynamically stable, with leukocytosis 16.1.  Left stump x-ray showed soft tissue swelling of the distal stump without emphysema, noted possible subtle/early erosive changes and lucency at the margin of the tibia and the distal anterior cortex of the tibia, raising concern for early changes of osteomyelitis.  MRI showed subcutaneous collection with surrounding enhancement and edema at the distal anterior amputation margin, possible abscess. Adjacent distal tibial periostitis and mild cortical and marrow enhancement, suspicious for early osteomyelitis. Subcutaneous edema along distal stump margin.   The patient underwent I&D of the left BKA stump, bone biopsy and wound vac placement by vascular surgery on 1/28.   Assessment and Plan:   Left BKA stump cellulitis, osteomyelitis, and abscess History of left BKA (2021) - Status post I&D of left BKA stump with bone biopsy of distal left tibia, and wound VAC placement on 1/28 by vascular surgery - Tissue biopsy culture positive for MRSA - Bone biopsy culture positive MRSA - Vascular surgery following, indicated wound vac for 1 week. Plan to take back to OR for wound evaluation on 2/2 - Continue vancomycin  - Continue Tylenol  1 g TID, Robaxin  5 mg TID, Gabapentin  600 mg TID, PRN tramadol  50 mg q6h, PRN IV morphine  2 mg q2h  Chronic diastolic CHF - TTE (06/2024) EF 55-60%, mild LVH, mod dilated LV, G2DD, mildly enlarged RV, biatrial dilatation, mild MR, aortic root dilation - Continue home Coreg  3.125 mg  BID, irbesartan  300 mg (for PTA olmesartan) - Appears euvolemic at this time  PAD - Continue aspirin  and Crestor  - Plavix  held, defer to vascular surgery for reinitiation  Normocytic anemia, mild - Hgb baseline 13-14 - Hgb 12.5 - Continue to monitor  Ventricular bigeminy - Noted on telemetry - Continue to monitor  HTN - Continue Coreg  3.125 twice daily, HCTZ 25, irbesartan  300 mg daily  T2DM - A1c 6.9 (10/08/2024) - SSI 0-15 AC and 0-5 HS  Diabetic neuropathy - Continue gabapentin   HLD - PTA Repatha and Crestor   - Continue Crestor  20  Hyponatremia - resolved   Scheduled Meds:  acetaminophen   1,000 mg Oral TID   aspirin  EC  81 mg Oral Daily   carvedilol   3.125 mg Oral BID   enoxaparin  (LOVENOX ) injection  40 mg Subcutaneous Q24H   gabapentin   600 mg Oral TID   hydrochlorothiazide   25 mg Oral Daily   insulin  aspart  0-15 Units Subcutaneous TID WC   insulin  aspart  0-5 Units Subcutaneous QHS   irbesartan   300 mg Oral Daily   methocarbamol   500 mg Oral TID   rosuvastatin   20 mg Oral QHS   Continuous Infusions:  vancomycin  1,250 mg (10/14/24 0817)   PRN Meds:.hydrALAZINE , morphine  injection, ondansetron  **OR** ondansetron  (ZOFRAN ) IV, traMADol   Current Outpatient Medications  Medication Instructions   acetaminophen  (TYLENOL ) 1,000 mg, Every 6 hours PRN   aspirin  EC 81 mg, Oral, Daily, Swallow whole.   Berberine Chloride 500 mg, Oral, Daily   carvedilol  (COREG ) 3.125 mg, Oral, 2 times daily   clopidogrel  (  PLAVIX ) 75 mg, Oral, Daily   EpiPen  2-Pak 0.3 mg, Intramuscular, As needed   gabapentin  (NEURONTIN ) 300 mg, Oral, Daily PRN   hydrochlorothiazide  (HYDRODIURIL ) 25 mg, Oral, Daily   methocarbamol  (ROBAXIN ) 500 mg, Oral, Every 6 hours PRN   Multiple Vitamin (MULTIVITAMIN WITH MINERALS) TABS tablet 1 tablet, Oral, 3 times weekly   mupirocin  ointment (BACTROBAN ) 2 % 1 Application, Topical, Daily PRN   olmesartan (BENICAR) 40 mg, Oral, Daily   Repatha SureClick  140 mg, Subcutaneous, Every 14 days   rosuvastatin  (CRESTOR ) 20 mg, Oral, Daily at bedtime   tiZANidine (ZANAFLEX) 2 mg, Oral, At bedtime PRN   traMADol  (ULTRAM ) 50 mg, Oral, Every 6 hours PRN    DVT prophylaxis: enoxaparin  (LOVENOX ) injection 40 mg Start: 10/08/24 2000   Code Status:   Code Status: Full Code  Family Communication: None  Disposition Plan: Home with HH pending vascular surgery clearance, plans for OR wound eval on 2/2 PT -   -   OT -   -    Level of care: Telemetry  Consultants:  Vascular surgery  Procedures:  1/28 - left BKA stump I&D, bone biopsy, wound vac placement  Antimicrobials: Cefepime  stopped 1/29 Vancomycin    Subjective: Examined at bedside.  No acute events overnight. Pain is well controlled with medication changes. Bone biopsy positive for MRSA. Plan for wound revision tomorrow.   Objective: Vitals:   10/13/24 1702 10/13/24 2145 10/14/24 0619 10/14/24 0812  BP: 128/88 129/81 (!) 141/86 129/83  Pulse: 60 64 67 67  Resp: 17 17 17 18   Temp: 98.2 F (36.8 C) 98 F (36.7 C)    TempSrc: Oral Oral    SpO2: 100% 99% 97% 97%  Weight:      Height:        Intake/Output Summary (Last 24 hours) at 10/14/2024 1132 Last data filed at 10/14/2024 0720 Gross per 24 hour  Intake 322.98 ml  Output 2150 ml  Net -1827.02 ml   Filed Weights   10/08/24 2246 10/10/24 1012  Weight: 111.3 kg 111.3 kg    Examination:  Gen: NAD, A&Ox3 HEENT: NCAT Neck: Supple CV: RRR, II/VI systolic murmur Resp: normal WOB, CTAB, no w/r/r Abd: Soft, NTND Ext: Left BKA with wound vac in place Skin: Warm, dry Neuro: CN II-XII grossly intact, strength 5/5 b/l, sensation intact Psych: Calm, cooperative, appropriate affect    Data Reviewed: I have personally reviewed following labs and imaging studies  CBC: Recent Labs  Lab 10/08/24 1652 10/08/24 1844 10/08/24 2204 10/10/24 0417 10/11/24 0514 10/12/24 0930 10/13/24 0454 10/14/24 0600  WBC 16.1* 16.2*   < >  12.2* 9.6 8.0 7.9 8.0  NEUTROABS 13.0* 13.2*  --  8.3*  --   --   --   --   HGB 14.3 13.8   < > 12.9* 11.5* 12.1* 11.9* 12.5*  HCT 41.6 39.8   < > 38.8* 34.8* 36.3* 35.8* 37.3*  MCV 84.4 84.7   < > 87.4 86.6 86.6 87.7 86.9  PLT 168 164   < > 142* 135* 161 158 174   < > = values in this interval not displayed.   Basic Metabolic Panel: Recent Labs  Lab 10/10/24 0417 10/11/24 0514 10/12/24 0930 10/13/24 0454 10/14/24 0600  NA 138 137 136 135 136  K 4.0 4.0 3.8 3.9 3.8  CL 100 102 100 101 101  CO2 29 25 23  21* 23  GLUCOSE 136* 138* 129* 264* 224*  BUN 17 19 15 17  16  CREATININE 1.06 1.05 0.91 0.93 0.79  CALCIUM  9.2 8.8* 9.3 9.1 9.3  MG 2.0  --   --   --   --   PHOS 2.2*  --   --  3.1 3.6   GFR: Estimated Creatinine Clearance: 121.8 mL/min (by C-G formula based on SCr of 0.79 mg/dL). Liver Function Tests: Recent Labs  Lab 10/08/24 1652 10/08/24 1844 10/09/24 0500 10/13/24 0454 10/14/24 0600  AST 36 36 26  --   --   ALT 31 34 26  --   --   ALKPHOS 54 52 49  --   --   BILITOT 0.5 0.5 0.8  --   --   PROT 7.4 6.8 7.0  --   --   ALBUMIN  4.4 4.4 4.1 3.6 3.6   No results for input(s): LIPASE, AMYLASE in the last 168 hours. No results for input(s): AMMONIA in the last 168 hours. Coagulation Profile: No results for input(s): INR, PROTIME in the last 168 hours. Cardiac Enzymes: No results for input(s): CKTOTAL, CKMB, CKMBINDEX, TROPONINI in the last 168 hours. BNP (last 3 results) No results for input(s): PROBNP in the last 8760 hours. HbA1C: No results for input(s): HGBA1C in the last 72 hours. CBG: Recent Labs  Lab 10/13/24 0802 10/13/24 1204 10/13/24 1659 10/13/24 2141 10/14/24 0752  GLUCAP 194* 149* 125* 182* 182*   Lipid Profile: No results for input(s): CHOL, HDL, LDLCALC, TRIG, CHOLHDL, LDLDIRECT in the last 72 hours. Thyroid Function Tests: No results for input(s): TSH, T4TOTAL, FREET4, T3FREE, THYROIDAB in the  last 72 hours. Anemia Panel: No results for input(s): VITAMINB12, FOLATE, FERRITIN, TIBC, IRON, RETICCTPCT in the last 72 hours. Sepsis Labs: Recent Labs  Lab 10/08/24 1735 10/08/24 2216  LATICACIDVEN 1.9 2.9*    Recent Results (from the past 240 hours)  Culture, blood (routine x 2)     Status: None   Collection Time: 10/08/24  4:50 PM   Specimen: BLOOD  Result Value Ref Range Status   Specimen Description BLOOD RIGHT ANTECUBITAL  Final   Special Requests   Final    BOTTLES DRAWN AEROBIC AND ANAEROBIC Blood Culture adequate volume   Culture   Final    NO GROWTH 5 DAYS Performed at [facility], [address], [place], [place] [address]    Report Status 10/13/2024 FINAL  Final  Culture, blood (routine x 2)     Status: None   Collection Time: 10/08/24  4:52 PM   Specimen: BLOOD LEFT FOREARM  Result Value Ref Range Status   Specimen Description BLOOD LEFT FOREARM  Final   Special Requests   Final    BOTTLES DRAWN AEROBIC AND ANAEROBIC Blood Culture adequate volume   Culture   Final    NO GROWTH 5 DAYS Performed at [facility], [address], [place], [place] [address]    Report Status 10/13/2024 FINAL  Final  MRSA Next Gen by PCR, Nasal     Status: None   Collection Time: 10/10/24  5:13 AM   Specimen: Nasal Mucosa; Nasal Swab  Result Value Ref Range Status   MRSA by PCR Next Gen NOT DETECTED NOT DETECTED Final    Comment: (NOTE) The GeneXpert MRSA Assay (FDA approved for NASAL specimens only), is one component of a comprehensive MRSA colonization surveillance program. It is not intended to diagnose MRSA infection nor to guide or monitor treatment for MRSA infections. Test performance is not FDA approved in patients less than 36 years old.  Performed at [facility], [address] [place], [place] [address]   Aerobic/Anaerobic Culture w Gram Stain (surgical/deep wound)     Status: None (Preliminary result)   Collection Time: 10/10/24  11:07 AM   Specimen: PATH Amputaion Arm/Leg; Tissue  Result Value Ref Range Status   Specimen Description TISSUE  Final   Special Requests SWAB FROM LEFT BKA WOUND  Final   Gram Stain   Final    MODERATE WBC PRESENT, PREDOMINANTLY PMN RARE GRAM POSITIVE COCCI Performed at [facility], [address] [place], [place] [address]    Culture   Final    FEW METHICILLIN RESISTANT STAPHYLOCOCCUS AUREUS NO ANAEROBES ISOLATED; CULTURE IN PROGRESS FOR 5 DAYS    Report Status PENDING  Incomplete   Organism ID, Bacteria METHICILLIN RESISTANT STAPHYLOCOCCUS AUREUS  Final      Susceptibility   Methicillin resistant staphylococcus aureus - MIC*    CIPROFLOXACIN  >=8 RESISTANT Resistant     ERYTHROMYCIN >=8 RESISTANT Resistant     GENTAMICIN <=0.5 SENSITIVE Sensitive     OXACILLIN >=4 RESISTANT Resistant     TETRACYCLINE <=1 SENSITIVE Sensitive     VANCOMYCIN  <=0.5 SENSITIVE Sensitive     TRIMETH/SULFA <=10 SENSITIVE Sensitive     CLINDAMYCIN <=0.25 SENSITIVE Sensitive     RIFAMPIN <=0.5 SENSITIVE Sensitive     Inducible Clindamycin NEGATIVE Sensitive     LINEZOLID  2 SENSITIVE Sensitive     * FEW METHICILLIN RESISTANT STAPHYLOCOCCUS AUREUS  Aerobic/Anaerobic Culture w Gram Stain (surgical/deep wound)     Status: None (Preliminary result)   Collection Time: 10/10/24 11:07 AM   Specimen: Bone; Tissue  Result Value Ref Range Status   Specimen Description BONE  Final   Special Requests LEFT TIBIA BKA  Final   Gram Stain RARE WBC SEEN NO ORGANISMS SEEN   Final   Culture   Final    RARE METHICILLIN RESISTANT STAPHYLOCOCCUS AUREUS CULTURE REINCUBATED FOR BETTER GROWTH NO ANAEROBES ISOLATED; CULTURE IN PROGRESS FOR 5 DAYS CRITICAL RESULT CALLED TO, READ BACK BY AND VERIFIED WITH: RN PATRICIA M. [id number] AT 1118, ADC Performed at [facility], [address], [place], [place] [address]    Report Status PENDING  Incomplete   Organism ID, Bacteria METHICILLIN RESISTANT STAPHYLOCOCCUS  AUREUS  Final      Susceptibility   Methicillin resistant staphylococcus aureus - MIC*    CIPROFLOXACIN  >=8 RESISTANT Resistant     ERYTHROMYCIN >=8 RESISTANT Resistant     GENTAMICIN <=0.5 SENSITIVE Sensitive     OXACILLIN >=4 RESISTANT Resistant     TETRACYCLINE <=1 SENSITIVE Sensitive     VANCOMYCIN  1 SENSITIVE Sensitive     TRIMETH/SULFA <=10 SENSITIVE Sensitive     CLINDAMYCIN <=0.25 SENSITIVE Sensitive     RIFAMPIN <=0.5 SENSITIVE Sensitive     Inducible Clindamycin NEGATIVE Sensitive     LINEZOLID  2 SENSITIVE Sensitive     * RARE METHICILLIN RESISTANT STAPHYLOCOCCUS AUREUS     Radiology Studies: No results found.  Scheduled Meds:  acetaminophen   1,000 mg Oral TID   aspirin  EC  81 mg Oral Daily   carvedilol   3.125 mg Oral BID   enoxaparin  (LOVENOX ) injection  40 mg Subcutaneous Q24H   gabapentin   600 mg Oral TID   hydrochlorothiazide   25 mg Oral Daily   insulin  aspart  0-15 Units Subcutaneous TID WC   insulin  aspart  0-5 Units Subcutaneous QHS   irbesartan   300 mg Oral Daily   methocarbamol   500 mg Oral TID   rosuvastatin   20 mg Oral QHS   Continuous Infusions:  vancomycin  1,250 mg (10/14/24 0817)     Unresulted Labs (From admission, onward)     Start     Ordered   10/15/24 0500  Creatinine, serum  (enoxaparin  (LOVENOX )    CrCl >/= 30 ml/min)  Weekly,   R     Comments: while on enoxaparin  therapy    10/08/24 1906   10/15/24 0500  CBC  Tomorrow morning,   R       Question:  Specimen collection method  Answer:  Lab=Lab collect   10/14/24 1939   10/15/24 0500  Basic metabolic panel with GFR  Tomorrow morning,   R       Question:  Specimen collection method  Answer:  Lab=Lab collect   10/14/24 1939             LOS:  LOS: 6 days   Time Spent: 35 minutes  Elkin Belfield Al-Sultani, MD [facility]  If 7PM-7AM, please contact night-coverage  10/14/2024, 11:32 AM      "

## 2024-10-14 NOTE — Progress Notes (Addendum)
" °  Progress Note    10/14/2024 8:31 AM 4 Days Post-Op  Subjective: No complaints    Vitals:   10/13/24 2145 10/14/24 0619  BP: 129/81 (!) 141/86  Pulse: 64 67  Resp: 17 17  Temp: 98 F (36.7 C)   SpO2: 99% 97%    Physical Exam: General: Sitting up in bed, no acute distress Cardiac: Regular Lungs: Nonlabored Incisions: Left BKA with wound VAC with good seal  CBC    Component Value Date/Time   WBC 8.0 10/14/2024 0600   RBC 4.29 10/14/2024 0600   HGB 12.5 (L) 10/14/2024 0600   HCT 37.3 (L) 10/14/2024 0600   PLT 174 10/14/2024 0600   MCV 86.9 10/14/2024 0600   MCH 29.1 10/14/2024 0600   MCHC 33.5 10/14/2024 0600   RDW 13.1 10/14/2024 0600   LYMPHSABS 2.6 10/10/2024 0417   MONOABS 1.1 (H) 10/10/2024 0417   EOSABS 0.1 10/10/2024 0417   BASOSABS 0.1 10/10/2024 0417    BMET    Component Value Date/Time   NA 136 10/14/2024 0600   K 3.8 10/14/2024 0600   CL 101 10/14/2024 0600   CO2 23 10/14/2024 0600   GLUCOSE 224 (H) 10/14/2024 0600   BUN 16 10/14/2024 0600   BUN 14 02/13/2014 1044   CREATININE 0.79 10/14/2024 0600   CREATININE 0.75 09/23/2020 1058   CALCIUM  9.3 10/14/2024 0600   GFRNONAA >60 10/14/2024 0600   GFRNONAA 85.5 07/31/2024 1303   GFRAA >60 05/20/2020 0933   GFRAA >60 02/13/2014 1044    INR    Component Value Date/Time   INR 1.1 10/03/2020 0630     Intake/Output Summary (Last 24 hours) at 10/14/2024 0831 Last data filed at 10/14/2024 0720 Gross per 24 hour  Intake 322.98 ml  Output 2150 ml  Net -1827.02 ml      Assessment/Plan:  64 y.o. male is 4 days postop, s/p:  1) incision and drainage of left below knee amputation stump 2) bone biopsy x 3 from distal left tibia 3) application of negative pressure wound VAC 9 x 3 x 3cm to left below knee amputation stump   - He is doing well this morning without any complaints -Left BKA with wound VAC with good seal -Will plan for takeback to the OR on Monday with possible BKA revision, washout,  and wound VAC placement -Will make n.p.o. at midnight and place consent orders   Ahmed Holster, PA-C [facility] [phone] 10/14/2024 8:31 AM  I agree with the above.  Plan for operative revision tomorrow.  N.p.o. after midnight.  Wells Rianna Lukes "

## 2024-10-15 ENCOUNTER — Encounter (HOSPITAL_COMMUNITY): Payer: Self-pay | Admitting: Internal Medicine

## 2024-10-15 ENCOUNTER — Inpatient Hospital Stay (HOSPITAL_COMMUNITY): Admitting: Certified Registered Nurse Anesthetist

## 2024-10-15 ENCOUNTER — Encounter (HOSPITAL_COMMUNITY): Admission: EM | Disposition: A | Payer: Self-pay | Source: Home / Self Care

## 2024-10-15 DIAGNOSIS — T8744 Infection of amputation stump, left lower extremity: Secondary | ICD-10-CM | POA: Diagnosis not present

## 2024-10-15 DIAGNOSIS — Z4889 Encounter for other specified surgical aftercare: Secondary | ICD-10-CM

## 2024-10-15 DIAGNOSIS — Z89512 Acquired absence of left leg below knee: Secondary | ICD-10-CM | POA: Diagnosis not present

## 2024-10-15 DIAGNOSIS — E11621 Type 2 diabetes mellitus with foot ulcer: Secondary | ICD-10-CM | POA: Diagnosis not present

## 2024-10-15 DIAGNOSIS — A4902 Methicillin resistant Staphylococcus aureus infection, unspecified site: Secondary | ICD-10-CM | POA: Diagnosis not present

## 2024-10-15 DIAGNOSIS — M868X6 Other osteomyelitis, lower leg: Secondary | ICD-10-CM

## 2024-10-15 DIAGNOSIS — L02416 Cutaneous abscess of left lower limb: Secondary | ICD-10-CM | POA: Diagnosis not present

## 2024-10-15 DIAGNOSIS — M869 Osteomyelitis, unspecified: Secondary | ICD-10-CM | POA: Diagnosis not present

## 2024-10-15 LAB — AEROBIC/ANAEROBIC CULTURE W GRAM STAIN (SURGICAL/DEEP WOUND)

## 2024-10-15 LAB — GLUCOSE, CAPILLARY
Glucose-Capillary: 133 mg/dL — ABNORMAL HIGH (ref 70–99)
Glucose-Capillary: 141 mg/dL — ABNORMAL HIGH (ref 70–99)
Glucose-Capillary: 178 mg/dL — ABNORMAL HIGH (ref 70–99)
Glucose-Capillary: 404 mg/dL — ABNORMAL HIGH (ref 70–99)
Glucose-Capillary: 420 mg/dL — ABNORMAL HIGH (ref 70–99)
Glucose-Capillary: 353 mg/dL — ABNORMAL HIGH (ref 70–99)

## 2024-10-15 LAB — BASIC METABOLIC PANEL WITH GFR
Anion gap: 14 (ref 5–15)
BUN: 18 mg/dL (ref 8–23)
CO2: 25 mmol/L (ref 22–32)
Calcium: 9.4 mg/dL (ref 8.9–10.3)
Chloride: 99 mmol/L (ref 98–111)
Creatinine, Ser: 0.95 mg/dL (ref 0.61–1.24)
GFR, Estimated: >60 mL/min
Glucose, Bld: 129 mg/dL — ABNORMAL HIGH (ref 70–99)
Potassium: 4.1 mmol/L (ref 3.5–5.1)
Sodium: 139 mmol/L (ref 135–145)

## 2024-10-15 LAB — CBC
HCT: 39.2 % (ref 39.0–52.0)
Hemoglobin: 13 g/dL (ref 13.0–17.0)
MCH: 29.1 pg (ref 26.0–34.0)
MCHC: 33.2 g/dL (ref 30.0–36.0)
MCV: 87.7 fL (ref 80.0–100.0)
Platelets: 197 10*3/uL (ref 150–400)
RBC: 4.47 MIL/uL (ref 4.22–5.81)
RDW: 12.9 % (ref 11.5–15.5)
WBC: 9.1 10*3/uL (ref 4.0–10.5)
nRBC: 0 % (ref 0.0–0.2)

## 2024-10-15 MED ORDER — INSULIN ASPART 100 UNIT/ML IJ SOLN: 0.0000 [IU] | INTRAMUSCULAR | Status: DC | PRN

## 2024-10-15 MED ORDER — FENTANYL CITRATE (PF) 250 MCG/5ML IJ SOLN
INTRAMUSCULAR | Status: DC | PRN
  Administered 2024-10-15: 50 ug via INTRAVENOUS

## 2024-10-15 MED ORDER — HYDROMORPHONE HCL 1 MG/ML IJ SOLN
0.2500 mg | INTRAMUSCULAR | Status: DC | PRN
  Administered 2024-10-15 (×2): 0.5 mg via INTRAVENOUS
  Administered 2024-10-15 (×2): 0.25 mg via INTRAVENOUS

## 2024-10-15 MED ORDER — ACETAMINOPHEN 10 MG/ML IV SOLN
INTRAVENOUS | Status: AC
  Filled 2024-10-15: qty 100

## 2024-10-15 MED ORDER — DEXAMETHASONE SOD PHOSPHATE PF 10 MG/ML IJ SOLN
INTRAMUSCULAR | Status: DC | PRN
  Administered 2024-10-15: 10 mg via INTRAVENOUS

## 2024-10-15 MED ORDER — ACETAMINOPHEN 10 MG/ML IV SOLN: 1000.0000 mg | Freq: Once | INTRAVENOUS | Status: DC | PRN

## 2024-10-15 MED ORDER — CHLORHEXIDINE GLUCONATE 0.12 % MT SOLN
15.0000 mL | Freq: Once | OROMUCOSAL | Status: AC
  Administered 2024-10-15: 15 mL via OROMUCOSAL

## 2024-10-15 MED ORDER — PROPOFOL 10 MG/ML IV BOLUS
INTRAVENOUS | Status: DC | PRN
  Administered 2024-10-15: 50 mg via INTRAVENOUS
  Administered 2024-10-15: 100 mg via INTRAVENOUS
  Administered 2024-10-15: 50 mg via INTRAVENOUS

## 2024-10-15 MED ORDER — VASHE WOUND IRRIGATION OPTIME
TOPICAL | Status: DC | PRN
  Administered 2024-10-15: 34 [oz_av]

## 2024-10-15 MED ORDER — HYDROMORPHONE HCL 1 MG/ML IJ SOLN
INTRAMUSCULAR | Status: AC
  Filled 2024-10-15: qty 1

## 2024-10-15 MED ORDER — OXYCODONE HCL 5 MG/5ML PO SOLN: 5.0000 mg | Freq: Once | ORAL | Status: AC | PRN

## 2024-10-15 MED ORDER — MORPHINE SULFATE (PF) 2 MG/ML IV SOLN
2.0000 mg | INTRAVENOUS | Status: DC | PRN
  Administered 2024-10-15 – 2024-10-16 (×3): 2 mg via INTRAVENOUS
  Filled 2024-10-15 (×3): qty 1

## 2024-10-15 MED ORDER — ONDANSETRON HCL 4 MG/2ML IJ SOLN
INTRAMUSCULAR | Status: DC | PRN
  Administered 2024-10-15: 4 mg via INTRAVENOUS

## 2024-10-15 MED ORDER — ORAL CARE MOUTH RINSE: 15.0000 mL | Freq: Once | OROMUCOSAL | Status: AC

## 2024-10-15 MED ORDER — INSULIN ASPART 100 UNIT/ML IJ SOLN
6.0000 [IU] | Freq: Once | INTRAMUSCULAR | Status: AC
  Administered 2024-10-15: 6 [IU] via SUBCUTANEOUS
  Filled 2024-10-15: qty 6

## 2024-10-15 MED ORDER — 0.9 % SODIUM CHLORIDE (POUR BTL) OPTIME
TOPICAL | Status: DC | PRN
  Administered 2024-10-15: 1000 mL

## 2024-10-15 MED ORDER — ONDANSETRON HCL 4 MG/2ML IJ SOLN: 4.0000 mg | Freq: Once | INTRAMUSCULAR | Status: DC | PRN

## 2024-10-15 MED ORDER — OXYCODONE HCL 5 MG PO TABS
ORAL_TABLET | ORAL | Status: AC
  Filled 2024-10-15: qty 1

## 2024-10-15 MED ORDER — PHENYLEPHRINE HCL-NACL 20-0.9 MG/250ML-% IV SOLN
INTRAVENOUS | Status: DC | PRN
  Administered 2024-10-15: 20 ug/min via INTRAVENOUS

## 2024-10-15 MED ORDER — VANCOMYCIN HCL 1000 MG IV SOLR
INTRAVENOUS | Status: DC | PRN
  Administered 2024-10-15: 2000 mg via TOPICAL

## 2024-10-15 MED ORDER — LIDOCAINE 2% (20 MG/ML) 5 ML SYRINGE
INTRAMUSCULAR | Status: DC | PRN
  Administered 2024-10-15: 100 mg via INTRAVENOUS

## 2024-10-15 MED ORDER — LACTATED RINGERS IV SOLN: INTRAVENOUS | Status: DC

## 2024-10-15 MED ORDER — AMISULPRIDE (ANTIEMETIC) 5 MG/2ML IV SOLN: 10.0000 mg | Freq: Once | INTRAVENOUS | Status: DC | PRN

## 2024-10-15 MED ORDER — FENTANYL CITRATE (PF) 250 MCG/5ML IJ SOLN
INTRAMUSCULAR | Status: AC
  Filled 2024-10-15: qty 5

## 2024-10-15 MED ORDER — OXYCODONE HCL 5 MG PO TABS
5.0000 mg | ORAL_TABLET | Freq: Once | ORAL | Status: AC | PRN
  Administered 2024-10-15: 5 mg via ORAL

## 2024-10-15 NOTE — Progress Notes (Signed)
 BG [phone], Dr. Mosie made aware.

## 2024-10-15 NOTE — Anesthesia Preprocedure Evaluation (Addendum)
 "                                  Anesthesia Evaluation  Patient identified by MRN, date of birth, ID band Patient awake    Reviewed: Allergy & Precautions, NPO status , Patient's Chart, lab work & pertinent test results  History of Anesthesia Complications Negative for: history of anesthetic complications  Airway Mallampati: III  TM Distance: >3 FB Neck ROM: Full    Dental  (+) Teeth Intact, Dental Advisory Given   Pulmonary former smoker   breath sounds clear to auscultation       Cardiovascular hypertension, Pt. on medications and Pt. on home beta blockers + CAD and + Peripheral Vascular Disease (on Plavix ;  s/p left BKA 2/2 osteomyelitis)  + dysrhythmias (1st degree AV Block) + Valvular Problems/Murmurs (Ascending Aorta Aneurysm)  Rhythm:Regular Rate:Normal   EKG: SR with 1st degree AV block  TTE (06/2024)  1. Left ventricular ejection fraction, by estimation, is 55 to 60%. Left  ventricular ejection fraction by 3D volume is 56 %. The left ventricle has  normal function. The left ventricle has no regional wall motion  abnormalities. The left ventricular  internal cavity size was moderately dilated. There is mild eccentric left  ventricular hypertrophy. Left ventricular diastolic parameters are  consistent with Grade II diastolic dysfunction (pseudonormalization).   2. Right ventricular systolic function is normal. The right ventricular  size is mildly enlarged. Tricuspid regurgitation signal is inadequate for  assessing PA pressure.   3. Left atrial size was moderately dilated.   4. Right atrial size was moderately dilated.   5. The mitral valve is normal in structure. Mild mitral valve  regurgitation. No evidence of mitral stenosis.   6. The aortic valve has an indeterminant number of cusps. There is  moderate calcification of the aortic valve. Aortic valve regurgitation is  not visualized. Aortic valve sclerosis/calcification is present, without  any  evidence of aortic stenosis.   7. Aortic dilatation noted. Aneurysm of the aortic root, measuring 45 mm.  Aneurysm of the ascending aorta, measuring 46 mm.   8. The inferior vena cava is normal in size with greater than 50%  respiratory variability, suggesting right atrial pressure of 3 mmHg.     Neuro/Psych    GI/Hepatic   Endo/Other  diabetes, Well Controlled, Type 2    Renal/GU Renal disease     Musculoskeletal  (+) Arthritis ,    Abdominal   Peds  Hematology   Anesthesia Other Findings   Reproductive/Obstetrics                              Anesthesia Physical Anesthesia Plan  ASA: 3  Anesthesia Plan: General   Post-op Pain Management:    Induction: Intravenous  PONV Risk Score and Plan: 2 and Ondansetron , Dexamethasone  and Treatment may vary due to age or medical condition  Airway Management Planned: LMA  Additional Equipment: None  Intra-op Plan:   Post-operative Plan: Extubation in OR  Informed Consent: I have reviewed the patients History and Physical, chart, labs and discussed the procedure including the risks, benefits and alternatives for the proposed anesthesia with the patient or authorized representative who has indicated his/her understanding and acceptance.     Dental advisory given  Plan Discussed with: CRNA  Anesthesia Plan Comments:  Anesthesia Quick Evaluation  "

## 2024-10-15 NOTE — Anesthesia Postprocedure Evaluation (Signed)
"   Anesthesia Post Note  Patient: Anthony Park  Procedure(s) Performed: IRRIGATION AND DEBRIDEMENT WOUND (Left) REVISION AMPUTATION, BELOW THE KNEE (Left) REPLACEMENT, WOUND VAC DRESSING (Left: Leg Lower)     Patient location during evaluation: PACU Anesthesia Type: General Level of consciousness: awake and alert Pain management: pain level controlled Vital Signs Assessment: post-procedure vital signs reviewed and stable Respiratory status: spontaneous breathing Cardiovascular status: blood pressure returned to baseline Postop Assessment: no apparent nausea or vomiting Anesthetic complications: no   No notable events documented.  Last Vitals:  Vitals:   10/15/24 1145 10/15/24 1220  BP: 118/80 108/75  Pulse: 67 89  Resp: 16 17  Temp: 36.9 C   SpO2: 94% 100%    Last Pain:  Vitals:   10/15/24 1246  TempSrc:   PainSc: 3                  Alayssa Flinchum T Colhoun      "

## 2024-10-15 NOTE — Plan of Care (Signed)

## 2024-10-15 NOTE — Transfer of Care (Signed)
 Immediate Anesthesia Transfer of Care Note  Patient: Anthony Park  Procedure(s) Performed: IRRIGATION AND DEBRIDEMENT WOUND (Left) REVISION AMPUTATION, BELOW THE KNEE (Left) REPLACEMENT, WOUND VAC DRESSING (Left: Leg Lower)  Patient Location: PACU  Anesthesia Type:General  Level of Consciousness: awake, alert , and oriented  Airway & Oxygen Therapy: Patient Spontanous Breathing  Post-op Assessment: Report given to RN and Post -op Vital signs reviewed and stable  Post vital signs: Reviewed and stable  Last Vitals:  Vitals Value Taken Time  BP 126/89 10/15/24 10:50  Temp 36.9 C 10/15/24 10:50  Pulse 51 10/15/24 10:51  Resp 16 10/15/24 10:51  SpO2 95 % 10/15/24 10:51  Vitals shown include unfiled device data.  Last Pain:  Vitals:   10/15/24 0825  TempSrc:   PainSc: 3       Patients Stated Pain Goal: 3 (10/15/24 0825)  Complications: No notable events documented.

## 2024-10-15 NOTE — Plan of Care (Signed)

## 2024-10-16 ENCOUNTER — Encounter (HOSPITAL_COMMUNITY): Payer: Self-pay | Admitting: Vascular Surgery

## 2024-10-16 ENCOUNTER — Other Ambulatory Visit (HOSPITAL_COMMUNITY): Payer: Self-pay

## 2024-10-16 DIAGNOSIS — M86662 Other chronic osteomyelitis, left tibia and fibula: Secondary | ICD-10-CM | POA: Diagnosis not present

## 2024-10-16 DIAGNOSIS — Z89512 Acquired absence of left leg below knee: Secondary | ICD-10-CM | POA: Diagnosis not present

## 2024-10-16 DIAGNOSIS — T8744 Infection of amputation stump, left lower extremity: Secondary | ICD-10-CM | POA: Diagnosis not present

## 2024-10-16 LAB — CBC
HCT: 35.2 % — ABNORMAL LOW (ref 39.0–52.0)
Hemoglobin: 11.9 g/dL — ABNORMAL LOW (ref 13.0–17.0)
MCH: 29.2 pg (ref 26.0–34.0)
MCHC: 33.8 g/dL (ref 30.0–36.0)
MCV: 86.5 fL (ref 80.0–100.0)
Platelets: 201 10*3/uL (ref 150–400)
RBC: 4.07 MIL/uL — ABNORMAL LOW (ref 4.22–5.81)
RDW: 12.6 % (ref 11.5–15.5)
WBC: 12.5 10*3/uL — ABNORMAL HIGH (ref 4.0–10.5)
nRBC: 0 % (ref 0.0–0.2)

## 2024-10-16 LAB — BASIC METABOLIC PANEL WITH GFR
Anion gap: 12 (ref 5–15)
BUN: 20 mg/dL (ref 8–23)
CO2: 21 mmol/L — ABNORMAL LOW (ref 22–32)
Calcium: 9.3 mg/dL (ref 8.9–10.3)
Chloride: 99 mmol/L (ref 98–111)
Creatinine, Ser: 0.9 mg/dL (ref 0.61–1.24)
GFR, Estimated: >60 mL/min
Glucose, Bld: 338 mg/dL — ABNORMAL HIGH (ref 70–99)
Potassium: 4.1 mmol/L (ref 3.5–5.1)
Sodium: 133 mmol/L — ABNORMAL LOW (ref 135–145)

## 2024-10-16 LAB — GLUCOSE, CAPILLARY
Glucose-Capillary: 242 mg/dL — ABNORMAL HIGH (ref 70–99)
Glucose-Capillary: 302 mg/dL — ABNORMAL HIGH (ref 70–99)
Glucose-Capillary: 198 mg/dL — ABNORMAL HIGH (ref 70–99)

## 2024-10-16 MED ORDER — DOXYCYCLINE HYCLATE 100 MG PO TABS
100.0000 mg | ORAL_TABLET | Freq: Two times a day (BID) | ORAL | 0 refills | Status: DC
  Filled 2024-10-16: qty 56, 28d supply, fill #0

## 2024-10-16 MED ORDER — DULOXETINE HCL 30 MG PO CPEP
30.0000 mg | ORAL_CAPSULE | Freq: Every day | ORAL | Status: DC
  Administered 2024-10-16: 30 mg via ORAL
  Filled 2024-10-16: qty 1

## 2024-10-16 MED ORDER — GABAPENTIN 300 MG PO CAPS
600.0000 mg | ORAL_CAPSULE | Freq: Three times a day (TID) | ORAL | 0 refills | Status: AC | PRN
  Filled 2024-10-16: qty 84, 14d supply, fill #0

## 2024-10-16 MED ORDER — METFORMIN HCL 500 MG PO TABS
500.0000 mg | ORAL_TABLET | Freq: Two times a day (BID) | ORAL | 0 refills | Status: AC
  Filled 2024-10-16: qty 60, 30d supply, fill #0

## 2024-10-16 MED ORDER — LINEZOLID 600 MG PO TABS
600.0000 mg | ORAL_TABLET | Freq: Two times a day (BID) | ORAL | 0 refills | Status: AC
  Filled 2024-10-16: qty 28, 14d supply, fill #0

## 2024-10-16 MED ORDER — OXYCODONE HCL 5 MG PO TABS: 5.0000 mg | ORAL_TABLET | Freq: Four times a day (QID) | ORAL | 0 refills | Status: AC | PRN

## 2024-10-16 MED ORDER — DULOXETINE HCL 30 MG PO CPEP
30.0000 mg | ORAL_CAPSULE | Freq: Every day | ORAL | 0 refills | Status: AC
  Filled 2024-10-16: qty 14, 14d supply, fill #0

## 2024-10-16 NOTE — Inpatient Diabetes Management (Signed)
 Inpatient Diabetes Program Recommendations  AACE/ADA: New Consensus Statement on Inpatient Glycemic Control (2015)  Target Ranges:  Prepandial:   less than 140 mg/dL      Peak postprandial:   less than 180 mg/dL (1-2 hours)      Critically ill patients:  140 - 180 mg/dL   Lab Results  Component Value Date   GLUCAP 302 (H) 10/16/2024   HGBA1C 6.9 (H) 10/08/2024    Review of Glycemic Control  Latest Reference Range & Units 10/15/24 08:02 10/15/24 10:51 10/15/24 12:13 10/15/24 17:56 10/15/24 21:45 10/15/24 23:03 10/16/24 02:53 10/16/24 08:22  Glucose-Capillary 70 - 99 mg/dL 866 (H) 858 (H) 821 (H) 404 (H) 420 (H) 353 (H) 242 (H) 302 (H)  (H): Data is abnormally high  Diabetes history: DM2 Outpatient Diabetes medications: None-diet controlled Current orders for Inpatient glycemic control: Novolog  0-15 units TID and 0-5 units QHS  Inpatient Diabetes Program Recommendations:    Hyperglycemia after Decadron  10 mg yesterday.  Please consider:  Novolog  0-20 units TID and at bedtime Might consider Metformin  500 mg BID for discharge  Thank you, Wyvonna Pinal, MSN, CDCES Diabetes Coordinator Inpatient Diabetes Program [phone] (team pager from 8a-5p)

## 2024-10-16 NOTE — Progress Notes (Signed)
 TOC meds delivered to patient at bedside.  At [facility]

## 2024-10-16 NOTE — Plan of Care (Signed)
" °  Problem: Nutritional: Goal: Maintenance of adequate nutrition will improve Outcome: Progressing   Problem: Education: Goal: Knowledge of General Education information will improve Description: Including pain rating scale, medication(s)/side effects and non-pharmacologic comfort measures Outcome: Progressing   Problem: Elimination: Goal: Will not experience complications related to urinary retention Outcome: Progressing   Problem: Safety: Goal: Ability to remain free from injury will improve Outcome: Progressing   "

## 2024-10-16 NOTE — TOC Progression Note (Signed)
 Transition of Care (TOC) - Progression Note   Provided update to [facility] with [facility].   Await ID recommendations  Patient Details  Name: Anthony Park MRN: [id number] Date of Birth: 06-09-1961  Transition of Care [facility]) CM/SW Contact  Candence Sease, Powell Jansky, RN Phone Number: 10/16/2024, 11:57 AM  Clinical Narrative:       Expected Discharge Plan: Home w Home Health Services Barriers to Discharge: Continued Medical Work up               Expected Discharge Plan and Services   Discharge Planning Services: CM Consult Post Acute Care Choice: Home Health, Durable Medical Equipment Living arrangements for the past 2 months: Single Family Home                 DME Arranged: Vac DME Agency: [employer] Date DME Agency Contacted: 10/11/24 Time DME Agency Contacted: 1100 Representative spoke with at DME Agency: [place] HH Arranged: RN HH Agency: [facility] Date [facility] Agency Contacted: 10/11/24 Time HH Agency Contacted: 1305 Representative spoke with at [facility] Agency: [place]le[place]   Social Drivers of Health (SDOH) Interventions SDOH Screenings   Food Insecurity: No Food Insecurity (10/10/2024)  Housing: Low Risk (10/10/2024)  Transportation Needs: No Transportation Needs (10/10/2024)  Utilities: Not At Risk (10/10/2024)  Tobacco Use: Medium Risk (10/15/2024)    Readmission Risk Interventions     No data to display

## 2024-10-16 NOTE — Consult Note (Signed)
 "    [facility]    Date of Admission:  10/08/2024     Total days of antibiotics 9               Reason for Consult: Osteomyelitis    Referring Provider: Dr. Mosie  Primary Care Provider: Pcp, No   ASSESSMENT:  Anthony Park is a 64 year old male with type 1 diabetes and previous history of left below-knee amputation January 2022 presenting with worsening pain and swelling of his stump site and found to have osteomyelitis and abscess now status post debridement with cultures positive for MRSA.  Blood cultures without growth and no bacteremia.  Was initially started on broad-spectrum antibiotics and narrowed to vancomycin .    Postop day #1 from below-knee amputation revision and vacuum-assisted dressing placement and tolerating vancomycin  with no adverse side effects.  Metabolic profile reviewed with no evidence of nephrotoxicity with vancomycin .  Discussed plan of care to change antibiotics to linezolid  600 mg p.o. twice daily for 2 weeks followed by doxycycline  100 mg p.o. twice daily for an additional4 weeks in total of 6 weeks of treatment given that the bone was not involved.  CBC reviewed with stable platelet level and will need to continue therapeutic drug monitoring with use of linezolid .  Will arrange for follow-up in ID clinic in 2 weeks.  Continue postoperative wound care per vascular surgery.  Plan of care discussed with internal medicine and ID pharmacy. Okay for discharge from ID standpoint.  Emphasized importance of continued blood sugar control which will place him at risk for further infection and possible complicated healing.  Contact precautions for MRSA.  Remaining medical and supportive care per internal medicine.    PLAN:  Change antibiotics to linezolid  600 mg PO bid for 2 weeks.  Therapeutic drug monitoring of platelet level while on linezolid .  Post-operative wound care per Vascular Surgery. Follow up in ID clinic in 2 weeks and okay for  discharge from ID standpoint.  Contact precautions for MRSA.  Remaining medical and supportive care per Internal Medicine.    Principal Problem:   Osteomyelitis of left tibia [facility]) Active Problems:   Type 2 diabetes mellitus with foot ulcer, without long-term current use of insulin  (HCC)   Mixed hyperlipidemia   PAD (peripheral artery disease)   S/P BKA (below knee amputation) unilateral, left (HCC)   AAA (abdominal aortic aneurysm) without rupture   Acute osteomyelitis of left tibia (HCC)   Abscess of left lower leg    acetaminophen   1,000 mg Oral TID   aspirin  EC  81 mg Oral Daily   carvedilol   3.125 mg Oral BID   DULoxetine   30 mg Oral Daily   enoxaparin  (LOVENOX ) injection  40 mg Subcutaneous Q24H   gabapentin   600 mg Oral TID   hydrochlorothiazide   25 mg Oral Daily   insulin  aspart  0-15 Units Subcutaneous TID WC   insulin  aspart  0-5 Units Subcutaneous QHS   irbesartan   300 mg Oral Daily   methocarbamol   500 mg Oral TID   rosuvastatin   20 mg Oral QHS     HPI: Anthony Park is a 64 y.o. male with previous medical history of Type 2 diabetes, hypertension, peripheral vascular disease and s/p left BKA secondary to osteomyelitis in 2022 presenting to the hospital with increased pain, redness and swelling.   Anthony Park initially underwent left below-knee amputation in January 2022 and now presents with increased pain and swelling of his left stump after  initially noting increased discomfort over a few days prior to presentation.  Initially afebrile with leukocytosis and white blood cell count of 17,300.  Left tibia and fibula x-ray with soft tissue swelling of the distal stump and possible early erosive changes concerning for early osteomyelitis.  MRI of the left tibia and fibula with subcutaneous enhancement and edema at the distal anterior amputation margin with possible abscess and suspicious for early osteomyelitis.  Orthopedics consulted with recommendation for revision of  the below-knee amputation with excision of abscess.  Received broad-spectrum antibiotics initially and was narrowed to vancomycin .  Blood cultures without growth and no evidence of bacteremia.  Brought to the OR on 10/10/2024 by vascular surgery for I&D of the left below-knee amputation stump with findings of purulent drainage which was sent for culture.  Per OR notes the bone did not appear to be involved with tissue between the abscess and the tibia.  Surgical specimens obtained growing gram-positive cocci on Gram stain and MRSA on culture.  Narrowed to vancomycin .  Most recent metabolic profile reviewed with creatinine of 0.90 indicating no evidence of nephrotoxicity while on vancomycin .  Most recent hemoglobin A1c of 6.9%.  Return to the OR on 10/15/2024 for amputation revision and placement of vacuum-assisted dressing.  No additional surgical interventions indicated.  ID has been asked for antibiotic recommendations.  Review of Systems: Review of Systems  Constitutional:  Negative for chills, fever and weight loss.  Respiratory:  Negative for cough, shortness of breath and wheezing.   Cardiovascular:  Negative for chest pain and leg swelling.  Gastrointestinal:  Negative for abdominal pain, constipation, diarrhea, nausea and vomiting.  Skin:  Negative for rash.     Past Medical History:  Diagnosis Date   AAA (abdominal aortic aneurysm)    Arthritis    Diabetes mellitus without complication (HCC)    type 2 controlled with diet    Diabetic toe ulcer (HCC) 11/05/2018   History of kidney stones    passed 1 several years ago   Hyperlipidemia    Hypertension    not on medications   Peripheral arterial disease    Peripheral vascular disease    stent in left leg due to anerysm     Social History[1]  Family History  Problem Relation Age of Onset   COPD Mother    Cataracts Mother    Brain cancer Father    Hypertension Brother     Allergies[2]  OBJECTIVE: Blood pressure 125/79,  pulse 76, temperature 98.4 F (36.9 C), temperature source Oral, resp. rate 18, height 6' (1.829 m), weight 111.3 kg, SpO2 97%.  Physical Exam Constitutional:      General: He is not in acute distress.    Appearance: He is well-developed.  Cardiovascular:     Rate and Rhythm: Normal rate and regular rhythm.     Heart sounds: Normal heart sounds.  Pulmonary:     Effort: Pulmonary effort is normal.     Breath sounds: Normal breath sounds.  Musculoskeletal:     Comments: Surgical bandage in place. Wound vac with bloody drainage.   Skin:    General: Skin is warm and dry.  Neurological:     Mental Status: He is alert and oriented to person, place, and time.     Lab Results Lab Results  Component Value Date   WBC 12.5 (H) 10/16/2024   HGB 11.9 (L) 10/16/2024   HCT 35.2 (L) 10/16/2024   MCV 86.5 10/16/2024   PLT 201 10/16/2024  Lab Results  Component Value Date   CREATININE 0.90 10/16/2024   BUN 20 10/16/2024   NA 133 (L) 10/16/2024   K 4.1 10/16/2024   CL 99 10/16/2024   CO2 21 (L) 10/16/2024    Lab Results  Component Value Date   ALT 26 10/09/2024   AST 26 10/09/2024   ALKPHOS 49 10/09/2024   BILITOT 0.8 10/09/2024     Microbiology: Recent Results (from the past 240 hours)  Culture, blood (routine x 2)     Status: None   Collection Time: 10/08/24  4:50 PM   Specimen: BLOOD  Result Value Ref Range Status   Specimen Description BLOOD RIGHT ANTECUBITAL  Final   Special Requests   Final    BOTTLES DRAWN AEROBIC AND ANAEROBIC Blood Culture adequate volume   Culture   Final    NO GROWTH 5 DAYS Performed at [facility], [address] [place], [place] [address]    Report Status 10/13/2024 FINAL  Final  Culture, blood (routine x 2)     Status: None   Collection Time: 10/08/24  4:52 PM   Specimen: BLOOD LEFT FOREARM  Result Value Ref Range Status   Specimen Description BLOOD LEFT FOREARM  Final   Special Requests   Final    BOTTLES DRAWN AEROBIC AND  ANAEROBIC Blood Culture adequate volume   Culture   Final    NO GROWTH 5 DAYS Performed at [facility], [address], [place], [place] [address]    Report Status 10/13/2024 FINAL  Final  MRSA Next Gen by PCR, Nasal     Status: None   Collection Time: 10/10/24  5:13 AM   Specimen: Nasal Mucosa; Nasal Swab  Result Value Ref Range Status   MRSA by PCR Next Gen NOT DETECTED NOT DETECTED Final    Comment: (NOTE) The GeneXpert MRSA Assay (FDA approved for NASAL specimens only), is one component of a comprehensive MRSA colonization surveillance program. It is not intended to diagnose MRSA infection nor to guide or monitor treatment for MRSA infections. Test performance is not FDA approved in patients less than 58 years old. Performed at [facility], [address] [place], [place] [address]   Aerobic/Anaerobic Culture w Gram Stain (surgical/deep wound)     Status: None   Collection Time: 10/10/24 11:07 AM   Specimen: PATH Amputaion Arm/Leg; Tissue  Result Value Ref Range Status   Specimen Description TISSUE  Final   Special Requests SWAB FROM LEFT BKA WOUND  Final   Gram Stain   Final    MODERATE WBC PRESENT, PREDOMINANTLY PMN RARE GRAM POSITIVE COCCI    Culture   Final    FEW METHICILLIN RESISTANT STAPHYLOCOCCUS AUREUS NO ANAEROBES ISOLATED Performed at [facility], [address] [place], [place] [address]    Report Status 10/15/2024 FINAL  Final   Organism ID, Bacteria METHICILLIN RESISTANT STAPHYLOCOCCUS AUREUS  Final      Susceptibility   Methicillin resistant staphylococcus aureus - MIC*    CIPROFLOXACIN  >=8 RESISTANT Resistant     ERYTHROMYCIN >=8 RESISTANT Resistant     GENTAMICIN <=0.5 SENSITIVE Sensitive     OXACILLIN >=4 RESISTANT Resistant     TETRACYCLINE <=1 SENSITIVE Sensitive     VANCOMYCIN  <=0.5 SENSITIVE Sensitive     TRIMETH/SULFA <=10 SENSITIVE Sensitive     CLINDAMYCIN <=0.25 SENSITIVE Sensitive     RIFAMPIN <=0.5 SENSITIVE  Sensitive     Inducible Clindamycin NEGATIVE Sensitive  LINEZOLID  2 SENSITIVE Sensitive     * FEW METHICILLIN RESISTANT STAPHYLOCOCCUS AUREUS  Aerobic/Anaerobic Culture w Gram Stain (surgical/deep wound)     Status: None   Collection Time: 10/10/24 11:07 AM   Specimen: Bone; Tissue  Result Value Ref Range Status   Specimen Description BONE  Final   Special Requests LEFT TIBIA BKA  Final   Gram Stain RARE WBC SEEN NO ORGANISMS SEEN   Final   Culture   Final    RARE METHICILLIN RESISTANT STAPHYLOCOCCUS AUREUS NO ANAEROBES ISOLATED CRITICAL RESULT CALLED TO, READ BACK BY AND VERIFIED WITH: RN AVELINA BATTLE [id number] AT 1118, ADC Performed at [facility], [address] [place], [place] [address]    Report Status 10/15/2024 FINAL  Final   Organism ID, Bacteria METHICILLIN RESISTANT STAPHYLOCOCCUS AUREUS  Final      Susceptibility   Methicillin resistant staphylococcus aureus - MIC*    CIPROFLOXACIN  >=8 RESISTANT Resistant     ERYTHROMYCIN >=8 RESISTANT Resistant     GENTAMICIN <=0.5 SENSITIVE Sensitive     OXACILLIN >=4 RESISTANT Resistant     TETRACYCLINE <=1 SENSITIVE Sensitive     VANCOMYCIN  1 SENSITIVE Sensitive     TRIMETH/SULFA <=10 SENSITIVE Sensitive     CLINDAMYCIN <=0.25 SENSITIVE Sensitive     RIFAMPIN <=0.5 SENSITIVE Sensitive     Inducible Clindamycin NEGATIVE Sensitive     LINEZOLID  2 SENSITIVE Sensitive     * RARE METHICILLIN RESISTANT STAPHYLOCOCCUS AUREUS     Cathlyn July, NP [facility]  10/16/2024  12:33 PM     [1]  Social History Tobacco Use   Smoking status: Former    Current packs/day: 0.00    Average packs/day: 0.5 packs/day for 41.0 years (20.5 ttl pk-yrs)    Types: Cigarettes    Start date: 07/21/1979    Quit date: 07/20/2020    Years since quitting: 4.2   Smokeless tobacco: Never  Vaping Use   Vaping status: Never Used  Substance Use Topics   Alcohol  use: Yes    Alcohol /week:  14.0 standard drinks of alcohol     Types: 14 Standard drinks or equivalent per week    Comment: daily   Drug use: Yes    Comment: marijuana  [2]  Allergies Allergen Reactions   Meloxicam  Nausea Only    Upset stomach    "

## 2024-10-17 NOTE — Progress Notes (Signed)
 Patient discharged, Important Message Letter mailed to patient.

## 2024-10-17 NOTE — Care Management Important Message (Signed)
 Important Message  Patient Details  Name: Anthony Park MRN: [id number] Date of Birth: 1961-02-03   Important Message Given:  No     Jennie Laneta Dragon 10/17/2024, 2:22 PM

## 2024-10-18 ENCOUNTER — Telehealth: Payer: Self-pay

## 2024-10-18 MED ORDER — DOXYCYCLINE HYCLATE 100 MG PO TABS: 100.0000 mg | ORAL_TABLET | Freq: Two times a day (BID) | ORAL | 0 refills | Status: AC

## 2024-10-18 NOTE — Telephone Encounter (Signed)
 April Rn with [facility] called on behalf of patient regarding doxycyline prescription. Was told during admission he would need to start this on 2/18- pharmacy did not receive script.  New script sent to [facility] pharmacy. Lorenda CHRISTELLA Code, RMA

## 2024-10-18 NOTE — Telephone Encounter (Signed)
 April from [facility] called to verify wound vac change frequency.  Vac to be changed 2x a week.  April verbalized understanding of above.

## 2024-10-29 ENCOUNTER — Inpatient Hospital Stay: Payer: Self-pay | Admitting: Family

## 2024-11-01 ENCOUNTER — Ambulatory Visit

## 2024-11-20 ENCOUNTER — Inpatient Hospital Stay: Payer: Self-pay | Admitting: Infectious Diseases

## 2024-11-20 ENCOUNTER — Ambulatory Visit: Admitting: Vascular Surgery

## 2024-11-20 ENCOUNTER — Ambulatory Visit (HOSPITAL_COMMUNITY)
